# Patient Record
Sex: Female | Born: 1940
Health system: Southern US, Community
[De-identification: ages and names within clinical notes are randomized; demographics above are authoritative.]

## PROBLEM LIST (undated history)

## (undated) DIAGNOSIS — R413 Other amnesia: Secondary | ICD-10-CM

## (undated) DIAGNOSIS — E785 Hyperlipidemia, unspecified: Secondary | ICD-10-CM

## (undated) DIAGNOSIS — I739 Peripheral vascular disease, unspecified: Secondary | ICD-10-CM

## (undated) DIAGNOSIS — Z8709 Personal history of other diseases of the respiratory system: Secondary | ICD-10-CM

## (undated) DIAGNOSIS — Z22322 Carrier or suspected carrier of Methicillin resistant Staphylococcus aureus: Secondary | ICD-10-CM

## (undated) DIAGNOSIS — N200 Calculus of kidney: Secondary | ICD-10-CM

## (undated) DIAGNOSIS — I779 Disorder of arteries and arterioles, unspecified: Secondary | ICD-10-CM

## (undated) DIAGNOSIS — M1990 Unspecified osteoarthritis, unspecified site: Secondary | ICD-10-CM

## (undated) DIAGNOSIS — T4145XA Adverse effect of unspecified anesthetic, initial encounter: Secondary | ICD-10-CM

## (undated) DIAGNOSIS — T8859XA Other complications of anesthesia, initial encounter: Secondary | ICD-10-CM

## (undated) DIAGNOSIS — I10 Essential (primary) hypertension: Secondary | ICD-10-CM

## (undated) DIAGNOSIS — I2510 Atherosclerotic heart disease of native coronary artery without angina pectoris: Secondary | ICD-10-CM

## (undated) DIAGNOSIS — Z973 Presence of spectacles and contact lenses: Secondary | ICD-10-CM

## (undated) DIAGNOSIS — R011 Cardiac murmur, unspecified: Secondary | ICD-10-CM

## (undated) DIAGNOSIS — A419 Sepsis, unspecified organism: Secondary | ICD-10-CM

## (undated) DIAGNOSIS — I420 Dilated cardiomyopathy: Secondary | ICD-10-CM

## (undated) DIAGNOSIS — I119 Hypertensive heart disease without heart failure: Secondary | ICD-10-CM

## (undated) HISTORY — DX: Atherosclerotic heart disease of native coronary artery without angina pectoris: I25.10

## (undated) HISTORY — DX: Hypertensive heart disease without heart failure: I11.9

## (undated) HISTORY — DX: Calculus of kidney: N20.0

## (undated) HISTORY — PX: ABDOMINAL HYSTERECTOMY: SHX81

## (undated) HISTORY — PX: ESOPHAGOGASTRODUODENOSCOPY: SHX1529

## (undated) HISTORY — DX: Sepsis, unspecified organism: A41.9

## (undated) HISTORY — DX: Dilated cardiomyopathy: I42.0

## (undated) HISTORY — DX: Peripheral vascular disease, unspecified: I73.9

## (undated) HISTORY — DX: Unspecified osteoarthritis, unspecified site: M19.90

## (undated) HISTORY — PX: KNEE SURGERY: SHX244

## (undated) HISTORY — DX: Disorder of arteries and arterioles, unspecified: I77.9

## (undated) HISTORY — PX: COLONOSCOPY: SHX174

## (undated) HISTORY — PX: EYE SURGERY: SHX253

## (undated) HISTORY — DX: Hyperlipidemia, unspecified: E78.5

## (undated) NOTE — *Deleted (*Deleted)
It was great to see you again today!  

---

## 2000-05-20 ENCOUNTER — Other Ambulatory Visit: Admission: RE | Admit: 2000-05-20 | Discharge: 2000-05-20 | Payer: Self-pay | Admitting: Obstetrics and Gynecology

## 2001-10-10 ENCOUNTER — Encounter: Payer: Self-pay | Admitting: Family Medicine

## 2001-10-10 ENCOUNTER — Encounter: Admission: RE | Admit: 2001-10-10 | Discharge: 2001-10-10 | Payer: Self-pay | Admitting: Family Medicine

## 2002-08-03 ENCOUNTER — Encounter: Payer: Self-pay | Admitting: Orthopedic Surgery

## 2002-08-11 ENCOUNTER — Ambulatory Visit (HOSPITAL_COMMUNITY): Admission: RE | Admit: 2002-08-11 | Discharge: 2002-08-11 | Payer: Self-pay | Admitting: Orthopedic Surgery

## 2003-12-28 ENCOUNTER — Other Ambulatory Visit: Admission: RE | Admit: 2003-12-28 | Discharge: 2003-12-28 | Payer: Self-pay | Admitting: Gynecology

## 2004-04-04 ENCOUNTER — Inpatient Hospital Stay (HOSPITAL_COMMUNITY): Admission: RE | Admit: 2004-04-04 | Discharge: 2004-04-05 | Payer: Self-pay | Admitting: Gynecology

## 2005-06-07 ENCOUNTER — Ambulatory Visit: Payer: Self-pay | Admitting: Cardiology

## 2005-06-25 ENCOUNTER — Ambulatory Visit: Payer: Self-pay

## 2005-06-25 ENCOUNTER — Encounter: Payer: Self-pay | Admitting: Cardiology

## 2005-07-30 HISTORY — PX: CORONARY ANGIOPLASTY: SHX604

## 2005-08-06 ENCOUNTER — Ambulatory Visit: Payer: Self-pay | Admitting: Cardiology

## 2005-09-07 ENCOUNTER — Ambulatory Visit: Payer: Self-pay | Admitting: Cardiology

## 2005-10-19 ENCOUNTER — Ambulatory Visit: Payer: Self-pay | Admitting: Cardiology

## 2006-03-25 ENCOUNTER — Ambulatory Visit: Payer: Self-pay | Admitting: Cardiology

## 2006-03-26 ENCOUNTER — Ambulatory Visit: Payer: Self-pay

## 2006-08-28 ENCOUNTER — Ambulatory Visit: Payer: Self-pay | Admitting: Cardiology

## 2006-09-18 ENCOUNTER — Ambulatory Visit: Payer: Self-pay

## 2006-10-11 ENCOUNTER — Ambulatory Visit: Payer: Self-pay | Admitting: Cardiology

## 2006-10-11 LAB — CONVERTED CEMR LAB
ALT: 20 units/L (ref 0–40)
Bilirubin, Direct: 0.1 mg/dL (ref 0.0–0.3)
CO2: 32 meq/L (ref 19–32)
Calcium: 9.6 mg/dL (ref 8.4–10.5)
Cholesterol: 112 mg/dL (ref 0–200)
GFR calc Af Amer: 72 mL/min
GFR calc non Af Amer: 59 mL/min
Glucose, Bld: 103 mg/dL — ABNORMAL HIGH (ref 70–99)
LDL Cholesterol: 54 mg/dL (ref 0–99)
Potassium: 3.8 meq/L (ref 3.5–5.1)
Sodium: 145 meq/L (ref 135–145)
Total CHOL/HDL Ratio: 2.9
Total Protein: 6.5 g/dL (ref 6.0–8.3)
Triglycerides: 93 mg/dL (ref 0–149)

## 2006-11-14 ENCOUNTER — Ambulatory Visit: Payer: Self-pay | Admitting: Gastroenterology

## 2006-11-22 ENCOUNTER — Ambulatory Visit: Payer: Self-pay | Admitting: Gastroenterology

## 2006-12-27 ENCOUNTER — Ambulatory Visit (HOSPITAL_COMMUNITY): Admission: RE | Admit: 2006-12-27 | Discharge: 2006-12-28 | Payer: Self-pay | Admitting: Obstetrics and Gynecology

## 2006-12-27 ENCOUNTER — Encounter (INDEPENDENT_AMBULATORY_CARE_PROVIDER_SITE_OTHER): Payer: Self-pay | Admitting: Obstetrics and Gynecology

## 2007-02-12 ENCOUNTER — Inpatient Hospital Stay (HOSPITAL_COMMUNITY): Admission: EM | Admit: 2007-02-12 | Discharge: 2007-02-14 | Payer: Self-pay | Admitting: Emergency Medicine

## 2007-02-18 ENCOUNTER — Ambulatory Visit: Payer: Self-pay | Admitting: Gastroenterology

## 2007-02-24 ENCOUNTER — Ambulatory Visit: Payer: Self-pay | Admitting: Family Medicine

## 2007-03-07 ENCOUNTER — Ambulatory Visit: Payer: Self-pay | Admitting: Cardiology

## 2007-03-07 ENCOUNTER — Inpatient Hospital Stay (HOSPITAL_COMMUNITY): Admission: AD | Admit: 2007-03-07 | Discharge: 2007-03-13 | Payer: Self-pay | Admitting: Cardiology

## 2007-03-11 ENCOUNTER — Encounter: Payer: Self-pay | Admitting: Cardiology

## 2007-03-14 ENCOUNTER — Emergency Department (HOSPITAL_COMMUNITY): Admission: EM | Admit: 2007-03-14 | Discharge: 2007-03-14 | Payer: Self-pay | Admitting: Emergency Medicine

## 2007-03-24 ENCOUNTER — Ambulatory Visit: Payer: Self-pay | Admitting: Cardiology

## 2007-03-24 ENCOUNTER — Ambulatory Visit: Payer: Self-pay | Admitting: Cardiovascular Disease

## 2007-03-24 LAB — CONVERTED CEMR LAB
Alkaline Phosphatase: 150 units/L — ABNORMAL HIGH (ref 39–117)
BUN: 17 mg/dL (ref 6–23)
Basophils Relative: 0.2 % (ref 0.0–1.0)
Bilirubin, Direct: 0.3 mg/dL (ref 0.0–0.3)
CO2: 23 meq/L (ref 19–32)
Creatinine, Ser: 1.2 mg/dL (ref 0.4–1.2)
Eosinophils Relative: 2.3 % (ref 0.0–5.0)
GFR calc Af Amer: 58 mL/min
Glucose, Bld: 106 mg/dL — ABNORMAL HIGH (ref 70–99)
HCT: 34.4 % — ABNORMAL LOW (ref 36.0–46.0)
Hemoglobin: 12.2 g/dL (ref 12.0–15.0)
Lymphocytes Relative: 12.5 % (ref 12.0–46.0)
Monocytes Absolute: 0.9 10*3/uL — ABNORMAL HIGH (ref 0.2–0.7)
Monocytes Relative: 8.6 % (ref 3.0–11.0)
Neutro Abs: 7.6 10*3/uL (ref 1.4–7.7)
Neutrophils Relative %: 76.4 % (ref 43.0–77.0)
Potassium: 4.2 meq/L (ref 3.5–5.1)
RDW: 13.4 % (ref 11.5–14.6)
Total Bilirubin: 1 mg/dL (ref 0.3–1.2)
Total Protein: 7.2 g/dL (ref 6.0–8.3)
WBC: 9.9 10*3/uL (ref 4.5–10.5)

## 2007-04-09 ENCOUNTER — Ambulatory Visit: Payer: Self-pay | Admitting: Cardiology

## 2007-04-15 ENCOUNTER — Ambulatory Visit: Payer: Self-pay | Admitting: Cardiovascular Disease

## 2007-04-15 ENCOUNTER — Ambulatory Visit: Payer: Self-pay

## 2007-04-15 ENCOUNTER — Ambulatory Visit: Payer: Self-pay | Admitting: Internal Medicine

## 2007-04-15 LAB — CONVERTED CEMR LAB
ALT: 25 units/L (ref 0–35)
AST: 27 units/L (ref 0–37)
Albumin: 3.7 g/dL (ref 3.5–5.2)
Alkaline Phosphatase: 131 units/L — ABNORMAL HIGH (ref 39–117)
BUN: 14 mg/dL (ref 6–23)
Basophils Absolute: 0.1 10*3/uL (ref 0.0–0.1)
Basophils Relative: 1.3 % — ABNORMAL HIGH (ref 0.0–1.0)
CO2: 29 meq/L (ref 19–32)
Chloride: 103 meq/L (ref 96–112)
Creatinine, Ser: 1 mg/dL (ref 0.4–1.2)
HCT: 37.8 % (ref 36.0–46.0)
MCHC: 35.7 g/dL (ref 30.0–36.0)
Monocytes Relative: 5.3 % (ref 3.0–11.0)
RBC: 4.04 M/uL (ref 3.87–5.11)
RDW: 12.7 % (ref 11.5–14.6)
Total Bilirubin: 0.6 mg/dL (ref 0.3–1.2)

## 2007-04-24 ENCOUNTER — Encounter (HOSPITAL_COMMUNITY): Admission: RE | Admit: 2007-04-24 | Discharge: 2007-07-23 | Payer: Self-pay | Admitting: Cardiology

## 2007-06-04 ENCOUNTER — Ambulatory Visit: Payer: Self-pay | Admitting: Cardiology

## 2007-06-04 LAB — CONVERTED CEMR LAB
ALT: 18 units/L (ref 0–35)
Albumin: 3.6 g/dL (ref 3.5–5.2)
Alkaline Phosphatase: 112 units/L (ref 39–117)
Bilirubin, Direct: 0.1 mg/dL (ref 0.0–0.3)
LDL Cholesterol: 101 mg/dL — ABNORMAL HIGH (ref 0–99)
VLDL: 29 mg/dL (ref 0–40)

## 2007-07-24 ENCOUNTER — Encounter (HOSPITAL_COMMUNITY): Admission: RE | Admit: 2007-07-24 | Discharge: 2007-07-30 | Payer: Self-pay | Admitting: Cardiology

## 2007-07-31 ENCOUNTER — Encounter (HOSPITAL_COMMUNITY): Admission: RE | Admit: 2007-07-31 | Discharge: 2007-08-15 | Payer: Self-pay | Admitting: Cardiology

## 2007-08-08 ENCOUNTER — Ambulatory Visit: Payer: Self-pay | Admitting: Cardiology

## 2007-08-08 LAB — CONVERTED CEMR LAB
Albumin: 3.2 g/dL — ABNORMAL LOW (ref 3.5–5.2)
LDL Cholesterol: 73 mg/dL (ref 0–99)
Total Bilirubin: 0.7 mg/dL (ref 0.3–1.2)
Total CHOL/HDL Ratio: 4.2
Triglycerides: 138 mg/dL (ref 0–149)
VLDL: 28 mg/dL (ref 0–40)

## 2007-10-02 ENCOUNTER — Ambulatory Visit: Payer: Self-pay | Admitting: Cardiology

## 2007-10-10 ENCOUNTER — Ambulatory Visit: Payer: Self-pay

## 2007-10-10 LAB — CONVERTED CEMR LAB
AST: 26 units/L (ref 0–37)
Bilirubin, Direct: 0.1 mg/dL (ref 0.0–0.3)
Chloride: 107 meq/L (ref 96–112)
Cholesterol: 143 mg/dL (ref 0–200)
GFR calc non Af Amer: 53 mL/min
Glucose, Bld: 114 mg/dL — ABNORMAL HIGH (ref 70–99)
HDL: 38.5 mg/dL — ABNORMAL LOW (ref >39.0)
LDL Cholesterol: 80 mg/dL (ref 0–99)
Sodium: 139 meq/L (ref 135–145)
Total Bilirubin: 0.4 mg/dL (ref 0.3–1.2)

## 2007-12-17 ENCOUNTER — Ambulatory Visit: Payer: Self-pay | Admitting: Cardiology

## 2007-12-17 LAB — CONVERTED CEMR LAB
AST: 20 units/L (ref 0–37)
Albumin: 3.8 g/dL (ref 3.5–5.2)
Cholesterol: 148 mg/dL (ref 0–200)
Total Bilirubin: 0.7 mg/dL (ref 0.3–1.2)
Total CHOL/HDL Ratio: 4.9
VLDL: 30 mg/dL (ref 0–40)

## 2008-03-31 ENCOUNTER — Ambulatory Visit: Payer: Self-pay | Admitting: Cardiology

## 2008-05-11 ENCOUNTER — Ambulatory Visit: Payer: Self-pay | Admitting: Cardiology

## 2008-05-11 ENCOUNTER — Ambulatory Visit: Payer: Self-pay

## 2008-05-11 LAB — CONVERTED CEMR LAB
Albumin: 3.4 g/dL — ABNORMAL LOW (ref 3.5–5.2)
Alkaline Phosphatase: 102 units/L (ref 39–117)
BUN: 15 mg/dL (ref 6–23)
Calcium: 9.5 mg/dL (ref 8.4–10.5)
Cholesterol: 154 mg/dL (ref 0–200)
Creatinine, Ser: 1 mg/dL (ref 0.4–1.2)
GFR calc Af Amer: 71 mL/min
GFR calc non Af Amer: 59 mL/min
Glucose, Bld: 118 mg/dL — ABNORMAL HIGH (ref 70–99)
HDL: 32 mg/dL — ABNORMAL LOW (ref >39.0)
LDL Cholesterol: 92 mg/dL (ref 0–99)
Potassium: 4.3 meq/L (ref 3.5–5.1)
Total Protein: 6.5 g/dL (ref 6.0–8.3)
Triglycerides: 151 mg/dL — ABNORMAL HIGH (ref 0–149)
VLDL: 30 mg/dL (ref 0–40)

## 2008-10-08 ENCOUNTER — Ambulatory Visit: Payer: Self-pay | Admitting: Cardiology

## 2008-10-08 LAB — CONVERTED CEMR LAB
ALT: 19 units/L (ref 0–35)
AST: 22 units/L (ref 0–37)
Cholesterol: 158 mg/dL (ref 0–200)
HDL: 34.7 mg/dL — ABNORMAL LOW (ref >39.0)
Total Bilirubin: 0.7 mg/dL (ref 0.3–1.2)
Total Protein: 6.9 g/dL (ref 6.0–8.3)
VLDL: 25 mg/dL (ref 0–40)

## 2008-12-06 ENCOUNTER — Telehealth: Payer: Self-pay | Admitting: Cardiology

## 2009-01-14 DIAGNOSIS — F17200 Nicotine dependence, unspecified, uncomplicated: Secondary | ICD-10-CM

## 2009-01-14 DIAGNOSIS — J449 Chronic obstructive pulmonary disease, unspecified: Secondary | ICD-10-CM

## 2009-01-14 DIAGNOSIS — I679 Cerebrovascular disease, unspecified: Secondary | ICD-10-CM

## 2009-01-14 DIAGNOSIS — J4489 Other specified chronic obstructive pulmonary disease: Secondary | ICD-10-CM | POA: Insufficient documentation

## 2009-01-14 DIAGNOSIS — I2510 Atherosclerotic heart disease of native coronary artery without angina pectoris: Secondary | ICD-10-CM

## 2009-01-14 DIAGNOSIS — I739 Peripheral vascular disease, unspecified: Secondary | ICD-10-CM

## 2009-01-14 DIAGNOSIS — R079 Chest pain, unspecified: Secondary | ICD-10-CM

## 2009-01-14 DIAGNOSIS — E785 Hyperlipidemia, unspecified: Secondary | ICD-10-CM

## 2009-01-14 DIAGNOSIS — I10 Essential (primary) hypertension: Secondary | ICD-10-CM | POA: Insufficient documentation

## 2009-01-19 ENCOUNTER — Ambulatory Visit: Payer: Self-pay | Admitting: Cardiology

## 2009-01-19 ENCOUNTER — Encounter: Payer: Self-pay | Admitting: Cardiology

## 2009-01-19 DIAGNOSIS — I723 Aneurysm of iliac artery: Secondary | ICD-10-CM | POA: Insufficient documentation

## 2009-01-19 DIAGNOSIS — I714 Abdominal aortic aneurysm, without rupture, unspecified: Secondary | ICD-10-CM | POA: Insufficient documentation

## 2009-02-10 ENCOUNTER — Encounter: Payer: Self-pay | Admitting: Cardiology

## 2009-02-10 ENCOUNTER — Ambulatory Visit: Payer: Self-pay

## 2009-02-28 ENCOUNTER — Ambulatory Visit: Payer: Self-pay | Admitting: Cardiology

## 2009-03-01 ENCOUNTER — Encounter (INDEPENDENT_AMBULATORY_CARE_PROVIDER_SITE_OTHER): Payer: Self-pay | Admitting: *Deleted

## 2009-03-01 LAB — CONVERTED CEMR LAB
AST: 31 units/L (ref 0–37)
Alkaline Phosphatase: 100 units/L (ref 39–117)
BUN: 23 mg/dL (ref 6–23)
Bilirubin, Direct: 0 mg/dL (ref 0.0–0.3)
CO2: 27 meq/L (ref 19–32)
Calcium: 8.9 mg/dL (ref 8.4–10.5)
Chloride: 106 meq/L (ref 96–112)
Cholesterol: 130 mg/dL (ref 0–200)
Creatinine, Ser: 0.9 mg/dL (ref 0.4–1.2)
LDL Cholesterol: 58 mg/dL (ref 0–99)
Total CHOL/HDL Ratio: 3
Total Protein: 6.7 g/dL (ref 6.0–8.3)

## 2009-03-29 ENCOUNTER — Encounter: Payer: Self-pay | Admitting: Cardiology

## 2009-05-31 ENCOUNTER — Encounter: Payer: Self-pay | Admitting: Cardiology

## 2009-05-31 ENCOUNTER — Ambulatory Visit: Payer: Self-pay

## 2009-06-03 ENCOUNTER — Telehealth: Payer: Self-pay | Admitting: Cardiology

## 2009-08-29 ENCOUNTER — Encounter: Payer: Self-pay | Admitting: Cardiology

## 2009-10-05 ENCOUNTER — Telehealth: Payer: Self-pay | Admitting: Cardiology

## 2010-03-19 ENCOUNTER — Encounter: Payer: Self-pay | Admitting: Cardiology

## 2010-08-19 ENCOUNTER — Encounter: Payer: Self-pay | Admitting: Cardiology

## 2010-08-29 ENCOUNTER — Encounter: Payer: Self-pay | Admitting: Cardiology

## 2010-08-30 ENCOUNTER — Encounter: Payer: Self-pay | Admitting: Cardiology

## 2010-08-30 ENCOUNTER — Encounter (INDEPENDENT_AMBULATORY_CARE_PROVIDER_SITE_OTHER): Payer: Medicare Other

## 2010-08-30 ENCOUNTER — Ambulatory Visit: Admit: 2010-08-30 | Payer: Self-pay

## 2010-08-30 DIAGNOSIS — I6529 Occlusion and stenosis of unspecified carotid artery: Secondary | ICD-10-CM

## 2010-08-31 NOTE — Progress Notes (Signed)
Summary: PT ERQUEST CALL  Phone Note Call from Patient Call back at Home Phone [phone]   Caller: Patient Summary of Call: PT REQUEST CALL WOULD NOT GIVE ANY INFORMATION Initial call taken by: Judie Grieve,  October 05, 2009 11:43 AM  Follow-up for Phone Call        spoke with pt, she has an aching in her right leg, in her thigh and below her knee. she has no edema, redness or streaking of that leg. pt advised to call primary care md for eval. Deliah Goody, RN  October 05, 2009 12:30 PM

## 2010-08-31 NOTE — Letter (Signed)
Summary: [facility] - Flu Immunization  [facility]   Imported By: Marylou Mccoy 05/08/2010 12:43:56  _____________________________________________________________________  External Attachment:    Type:   Image     Comment:   External Document

## 2010-09-06 NOTE — Miscellaneous (Signed)
Summary: Orders Update  Clinical Lists Changes  Orders: Added new Test order of Carotid Duplex (Carotid Duplex) - Signed 

## 2010-11-01 ENCOUNTER — Other Ambulatory Visit: Payer: Self-pay | Admitting: Cardiology

## 2010-12-12 NOTE — Assessment & Plan Note (Signed)
[facility]                            CARDIOLOGY OFFICE NOTE   [name], [name]                      MRN:          [id number]  DATE:03/07/2007                            DOB:          August 04, 1940    [name] is a very pleasant female whom I have seen in the past for  hypertension, peripheral vascular disease, small abdominal aortic  aneurysm, cerebrovascular disease, and hyperlipidemia.  She did have a  Myoview performed on September 18, 2006.  At that time, she had an  ejection fraction of 74% and there was no ischemia or infarction.  Since  I last saw her, she did have a hysterectomy.  Approximately 5 weeks  later, she had a GI bleed that apparently was related to Schatzki's  ring.  Note, there was a question of whether or not she may have had a  Mallory-Weiss tear.  Since I last saw her, she denies any dyspnea on  exertion, orthopnea, PND, pedal edema, palpitations, pre-syncope,  syncope, or chest pain.  She does have some fatigue.   MEDICATIONS AT PRESENT:  1. Verapamil SA 240 mg p.o. b.i.d.  2. Hydrochlorothiazide 12.5 mg p.o. daily.  3. Lisinopril 40 mg p.o. daily.  4. Aspirin 8 mg p.o. daily.  5. Clonidine 0.2 mg tablets 2 p.o. b.i.d.  6. Zocor 40 mg p.o. daily.   PHYSICAL EXAM:  Blood pressure is 105/66 and her pulse is 66.  She  weighs 169 pounds.  HEENT:  Normal.  NECK:  Supple.  CHEST:  Clear.  CARDIOVASCULAR:  Regular rate and rhythm.  ABDOMEN:  Benign.  EXTREMITIES:  No edema.   ELECTROCARDIOGRAM:  Sinus rhythm with a first degree AV block.  There is  anterior and inferior T wave inversion, which is new from previous.   DIAGNOSES:  1. Abnormal electrocardiogram - note the patient's electrocardiogram      changes are new.  However, she has no symptoms at present.  We will      plan to repeat her stress Myoview for risk stratification.  She      will continue on her aspirin, Statin, and other medications for      blood  pressure control.  2. Hypertension - her blood pressure is well controlled on present      medications.  3. Peripheral vascular disease.  4. History of small abdominal aortic aneurysm - her most recent      abdominal ultrasound showed a normal caliber abdominal aorta.  She      did have moderate stenosis in the celiac access, superior      mesenteric artery, and left greater than right common iliac      arteries.  5. Cerebrovascular disease - she needs followup carotid Dopplers and      we will arrange those.  6. Chronic obstructive pulmonary disease.  7. Tobacco abuse - she was counseled to discontinue this.  8. Hyperlipidemia - she will continue on her Statin.   I will see her back in 6 months or sooner if necessary.     Courtney Frieze Jens Som, MD,  [facility]  Electronically Signed    BSC/MedQ  DD: 03/07/2007  DT: 03/08/2007  Job #: [id number]

## 2010-12-12 NOTE — Assessment & Plan Note (Signed)
[facility]                            CARDIOLOGY OFFICE NOTE   [name], [name]                      MRN:          [id number]  DATE:04/09/2007                            DOB:          27-Sep-1940    [name] returns for followup today. She had recent percutaneous  coronary intervention of her circumflex and right coronary artery in  early August. When I saw her on March 24, 2007 she was complaining of  tightness in her chest but it had been persistent for 2 weeks. We felt  that it was not cardiac. She was also having nausea. We discontinued her  Chantix and her symptoms improved. Since that time she feels much  better. She denies any dyspnea, chest pain, palpitations, or syncope.  There is no pedal edema. Her nausea has resolved. Also note we  discontinued her Zocor as her follow up liver functions were abnormal.  Her medications include:  1. Hydrochlorothiazide 12.5 mg p.o. daily.  2. Lisinopril 40 mg daily.  3. Aspirin 81 mg p.o. daily.  4. Clonidine 0.4 mg p.o. b.i.d.  5. Norvasc 10 mg p.o. daily.  6. Plavix 75 mg p.o. daily.  7. Metoprolol 25 mg p.o. b.i.d.   PHYSICAL EXAMINATION:  Today shows a blood pressure of 116/72 and the  pulse is 66. She weighs 165 pounds.  HEENT: Normal.  NECK: Supple.  CHEST: Clear.  CARDIOVASCULAR  EXAM: Reveals a regular rate and rhythm.  ABDOMINAL EXAM: Soft and not tender. Her right groin shows no hematoma  and no bruits.  EXTREMITIES: Show trace edema.   DIAGNOSES:  1. Coronary artery disease- [name] has had no further chest pain      and we will continue with her medical therapy including her      aspirin, Plavix, beta blocker, and ACE inhibitor. Her statin has      been discontinued for now secondary to increased liver functions.  2. Chest pain- resolved.  3. Hypertension- her blood pressure is well controlled on her present      medications.  4. Hyperlipidemia- we plan to recheck liver  functions in approximately      4 weeks. If her liver functions have returned to normal then we      will try a different statin (most likely Pravachol). We would then      check lipids and liver 6 weeks following and reinitiation.  5. Tobacco abuse- she is trying to discontinue this.  6. Peripheral vascular disease.  7. History of gastrointestinal bleed secondary to Mallory-Weiss tear.  8. Cerebral vascular disease- she is scheduled for follow up carotid      Doppler this month.  9. Chronic obstructive pulmonary disease.   I will see back in 6 months.     [name] Jens Som, MD, [facility]  Electronically Signed    BSC/MedQ  DD: 04/09/2007  DT: 04/09/2007  Job #: [id number]

## 2010-12-12 NOTE — H&P (Signed)
[name], [name]               ACCOUNT NO.:  [id number]   MEDICAL RECORD NO.:  [id number]          PATIENT TYPE:  INP   LOCATION:  1232                         FACILITY:  [facility]   PHYSICIAN:  Della Goo, M.D. DATE OF BIRTH:  10-Sep-1940   DATE OF ADMISSION:  02/12/2007  DATE OF DISCHARGE:                              HISTORY & PHYSICAL   PRIMARY CARE PHYSICIAN:  Dr. Susann James.   CHIEF COMPLAINT:  Vomiting bright red blood.   HISTORY OF PRESENT ILLNESS:  This is a 70 year old female who presented  to the emergency department with complaints of vomiting bright red blood  the evening prior to admission.  She reports having several episodes  which were proceeded with nausea.  She did not have melena or  hematochezia at that time.  She denied having any chest pain or  shortness of breath.  She did report having weakness and dizziness.  Upon arrival to the emergency department she began to have increased  stools which progressed from brownish solid stool that was heme  negative, to melanotic stools which were heme positive.  The patient  reported having crampy abdominal discomfort when she began to have the  increased stools.  She denies having any fevers, chills.    Six weeks ago, the patient underwent a total abdominal hysterectomy and  bilateral salpingo-oophorectomy.  She reports taking ibuprofen and Aleve  as needed for pain up to one week ago.   PAST MEDICAL HISTORY:  Hypertension   PAST SURGICAL HISTORY:  Total abdominal hysterectomy bilateral salpingo-  oophorectomy secondary to uterine and pelvic prolapse.   MEDICATIONS:  1. Verapamil 240 mg one p.o. daily.  2. Clonidine 0.2 mg one p.o. b.i.d.  3. Lisinopril 10 mg one p.o. daily.  4. Hydrochlorothiazide 12.5 mg one p.o. daily.  5. Over-the-counter Aleve p.r.n.   ALLERGIES:  NO KNOWN DRUG ALLERGIES.   SOCIAL HISTORY:  The patient is married, smoker, one pack per day for 40  years.  Rare alcohol usage.  No illicit  drug usage.   FAMILY HISTORY:  Positive strong family history of cancer.  Mother had  breast cancer.  Father died of leukemia.  One brother had bone cancer.  The patient also had a brother who died of Doreatha Martin disease and a  sister who has hypertension.  No diabetes or coronary artery disease in  her family that she knows of.   PHYSICAL EXAMINATION:  GENERAL:  Nervous 70 year old well-nourished,  well-developed female in discomfort, but no acute distress currently.  VITAL SIGNS:  Initially, temperature 97.0, blood pressure 141/73, heart  rate 99-111 and respirations 20 with O2 saturations 95%.  HEENT Normocephalic, atraumatic.  Pupils equally round reactive to  light.  Extraocular muscles are intact.  Funduscopic benign.  Oropharynx  is clear.  NECK:  Supple, full range of motion.  No thyromegaly, adenopathy,  jugular venous distension.  CARDIOVASCULAR:  Mild tachycardiac rate and rhythm.  No murmurs, gallops  or rubs.  LUNGS:  Clear to auscultation bilaterally.  ABDOMEN:  Positive bowel sounds, soft, mildly tender in the lower  quadrants.  No rebound or  guarding.  No hepatosplenomegaly.  EXTREMITIES: Without cyanosis, clubbing or edema.  NEUROLOGIC:  Alert and oriented x3.  There are no focal deficits.   LABORATORY STUDIES:  White blood cell count 19.7, hemoglobin 17.6,  hematocrit 49.6, MCV 93.3, platelets 265, neutrophils 85%, lymphocytes  12%, MCV 93.3.  Protime 12.6, INR 0.9, PTT 26.  Hemoccult positive  melanotic stools.  Chemistry:  Sodium is 137, potassium 3.0, chloride  102, carbon dioxide 24, BUN 16, creatinine 1.02, glucose 159, calcium  10.1, albumin 3.6.  Liver function tests:  AST 38, ALT 28, total  bilirubin 0.8.  Acute abdominal series pending.   ASSESSMENT:  A 70 year old female presenting with hematemesis being  admitted with  1. Acute GI bleeding/hematemesis.  2. Orthostatic hypotension.  3. Polycytosis and leukocytosis, possible myelodysplastic syndrome.   4. Hypokalemia.  5. History of hypertension.  6..  Tobacco abuse.   PLAN:  The patient will be admitted to an area for cardiac monitoring.  She has been placed on a clear liquid diet and IV fluids.  Her  electrolytes will be replaced p.r.n.  IV Protonix has been ordered.  A  unit of packed red blood cells has been placed on hold for now.  Serial  H&H will be performed q.12 h.  At this particular point, the patient  will be further evaluated and consultations will be placed for  gastroenterology and possibly hematology/oncology.  The patient has been  placed on her regular medications.  DVT prophylaxis of TED hose has been  ordered.      Della Goo, M.D.  Electronically Signed     HJ/MEDQ  D:  02/12/2007  T:  02/12/2007  Job:  [id number]

## 2010-12-12 NOTE — Discharge Summary (Signed)
NAME:  [name], [name]               ACCOUNT NO.:  [id number]   MEDICAL RECORD NO.:  [id number]          PATIENT TYPE:  INP   LOCATION:  6523                         FACILITY:  [facility]   PHYSICIAN:  Everardo Beals. Juanda Chance, MD, FACCDATE OF BIRTH:  06/24/1941   DATE OF ADMISSION:  03/07/2007  DATE OF DISCHARGE:  03/13/2007                               DISCHARGE SUMMARY   PRIMARY CARDIOLOGIST:  Madolyn Frieze. Jens Som, MD, [facility]   PRIMARY CARE PHYSICIAN:  Sharlot Gowda, M.D.   PROCEDURES PERFORMED DURING HOSPITALIZATION:  1. Cardiac catheterization dated March 10, 2007, and performed by Dr.      Charlies Constable.      a.     Coronary artery disease with 40% narrowing of the proximal       LAD, 80% in the proximal circumflex artery, 80% proximal to mid       stenosis in the right coronary artery with normal LV function.      b.     Right iliac aneurysm.      c.     Successful percutaneous coronary intervention of tandem       lesions in the proximal and proximal-to-mid right coronary artery       using 3 overlapping Palmaz stents (the second stent for guiding       catheter dissection and the third stent for probable edge       dissection) with improvement of stented area narrowing from 80% to       0%.      d.     Status post percutaneous coronary intervention to the       circumflex artery using a drug-eluting stent, a Palmaz, decreasing       90% stenosis to 0% stenosis per Dr. Charlies Constable.  This was       completed on March 12, 2007.   PRIMARY DISCHARGE DIAGNOSES:  1. Coronary artery disease.      a.     Status post stenting to the right coronary artery and       circumflex artery as described above (please see Dr. Regino Schultze       thorough cardiac catheterization note for details).  2. 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  3. Hypertension.  4. History of mild-to-moderate cerebrovascular disease.   SECONDARY DIAGNOSES:  1. History of recent GI bleed  secondary to Mallory-Weiss tear.  2. History of diverticula.  3. History of peripheral vascular disease and small abdominal aortic      aneurysm.  4. History of chronic obstructive pulmonary disease.  5. Ongoing tobacco abuse.  6. Hyperlipidemia.   HOSPITAL COURSE:  This is a 70 year old Caucasian female with the above-  mentioned diagnoses who presented to the emergency room complaining of  intermittent indigestion and burning in epigastric and substernal areas  on March 07, 2007.  These were recurring at rest.  She did not have any  associated nausea, vomiting, diaphoresis, or shortness of breath.  The  discomfort lasted approximately 15-30 minutes.  The patient had  underwent an EGD  on February 13, 2007, that revealed a small Mallory-Weiss  tear, but she denied any recent melena or hematochezia.  When seen in  the emergency room, the patient's electrocardiogram was markedly  abnormal compared to previous and was admitted for unstable angina.  The  patient was seen examined by Dr. Olga Millers in the emergency room.  The patient was started on Nexium.  The patient was also discussed.  A  cardiac catheterization by Dr. Jens Som with risks and benefits  evaluated and discussed, and the patient was willing to proceed.  The  patient was intermittently placed on heparin until catheterization was  completed.   The patient was seen and examined the following day by Dr. Jens Som.  The patient's cardiac enzymes were found to be negative.  She was  continued on heparin, statin, Lopressor, and ACE inhibitor, and  monitored over the weekend.  The patient subsequently had cardiac  catheterization completed as described above by Dr. Charlies Constable on  Monday, August 11.  The patient had two-vessel coronary artery disease  of the circumflex and right coronary artery.   Post initial intervention to the right coronary artery, the patient was  found to be hypertensive, and sheaths were delayed being  pulled  secondary to this.  The patient was given hydralazine 20 mg IV x1,  placed on nitroglycerin, and reinstitution of her p.o. antihypertensive  was given.  The patient's blood pressure did resume to normal without  any further incident.   The following day, the patient was found to have a hematoma of the right  groin.  Ultrasound did reveal a pseudoaneurysm.  The patient had  compression of the pseudoaneurysm that morning after given morphine and  nitroglycerin for blood pressure control and pain control.  The  compression was held for approximately 8 minutes, and the patient  remained stable.  Followup check that afternoon revealed that the  pseudoaneurysm remained thrombosed, and she had no further symptoms.  There was a great deal of ecchymosis noted around the site, however.   On March 12, 2007, the patient was seen again by Dr. Jens Som and Dr.  Juanda Chance and found to be stable to proceed with percutaneous coronary  intervention of the circumflex artery via the left femoral artery.  Circumflex percutaneous coronary intervention was completed with a drug-  eluting stent per Dr. Juanda Chance and was successful.  There was no post  procedure complications or pseudoaneurysm.   On March 13, 2007, the patient was feeling great and was anxious to go  home.  The patient's blood pressure was well controlled during the  hospitalization.  The patient's verapamil was discontinued, and she was  placed on Norvasc.  The patient's clonidine was also titrated down from  0.4 mg b.i.d. to 0.2 mg b.i.d.  Followup labs did reveal mild anemia at  9.4.  The patient's telemetry revealed normal sinus rhythm in the 80s,  and EKG revealed normal without any evidence of ischemia.  The patient  was seen and examined by myself and Dr. Charlies Constable on the day of  discharge and found to be stable.  She will be continued on Plavix and  aspirin along with Norvasc.  Clonidine will be decreased to 0.2 mg  b.i.d.   Lopressor was added to 25 mg b.i.d., and Plavix will need to be  on board for at least 1 year if not beyond.   DISCHARGE LABORATORY:  Sodium 140, potassium 3.4, chloride 107, CO2 of  27, BUN 16, creatinine 1.1,  glucose 124.  Hemoglobin 9.4, hematocrit  26.9, white blood cells 8.1, platelets 165.  Telemetry revealed normal  sinus rhythm in the 80s.   VITAL SIGNS:  Blood pressure 146/49, heart rate 96, respirations 16,  temperature 97.6, O2 sat 96% on room air.   DISCHARGE MEDICATIONS:  1. Norvasc 10 mg 1 p.o. daily (new).  2. Clonidine 0.2 mg daily (new dose).  3. Hydrochlorothiazide 12.5 mg daily.  4. Lisinopril 40 mg daily.  5. Aspirin 81 mg daily.  6. Plavix 75 mg daily new prescription).  7. Zocor 40 mg at bedtime.  8. Nitroglycerin 0.4 mg p.r.n. chest pain (new prescription).   ALLERGIES:  NO KNOWN DRUG ALLERGIES.   FOLLOWUP PLANS AND APPOINTMENTS:  1. The patient will be followed up by Dr. Olga Millers on August 25      at 8 a.m.  2. The patient will follow up with the primary care physician, Dr.      Susann Givens.  The patient is to call on her own accord to make that      appointment.  3. The patient has been given post-cardiac catheterization      instructions with particular emphasis on the groin site, post      catheterization for evidence of bleeding, return of hematoma      infection, or severe pain.  4. The patient has been advised on new medications.  She is to stop      her verapamil and start the Norvasc and clonidine at the lower      dose, Plavix, and nitroglycerin as needed.   TIME SPENT WITH THE PATIENT TO INCLUDE PHYSICIAN TIME:  50 minutes.      Bettey Mare. Lyman Bishop, NP      Everardo Beals. Juanda Chance, MD, [facility]  Electronically Signed    KML/MEDQ  D:  03/13/2007  T:  03/13/2007  Job:  [id number]   cc:   Sharlot Gowda, M.D.

## 2010-12-12 NOTE — H&P (Signed)
NAME:  [name], [name]               ACCOUNT NO.:  [id number]   MEDICAL RECORD NO.:  [id number]          PATIENT TYPE:  INP   LOCATION:  4739                         FACILITY:  [facility]   PHYSICIAN:  Madolyn Frieze. Jens Som, MD, FACCDATE OF BIRTH:  28-Jul-1941   DATE OF ADMISSION:  03/07/2007  DATE OF DISCHARGE:                              HISTORY & PHYSICAL   HISTORY OF PRESENT ILLNESS:  [name] is a very pleasant 70 year old  female who has past medical history of hypertension, hyperlipidemia,  peripheral vascular disease, tobacco abuse and recent GI bleed secondary  to [name] who is being admitted for chest pain and  electrocardiographic changes.  I have followed [name] in the past  for hypertension and peripheral vascular disease.  She did have an  abdominal ultrasound September 18, 2006, that showed a normal caliber  abdominal aorta.  There was moderate stenosis in the celiac axis, SMA  and left greater than right common iliac arteries.  She also has mild  cerebrovascular disease and a 40-59% range bilaterally.  She had a  Myoview on September 18, 2006, that showed normal perfusion and normal LV  function.  She had a recent vaginal hysterectomy on Dec 27, 2006.  Since  then, she has had intermittent indigestion.  She describes this as a  burning in the epigastric and substernal area.  There is no radiation.  It occurs predominantly after eating and is relieved with Prilosec.  There is no associated nausea, vomiting, diaphoresis or shortness of  breath.  It typically lasts 15-30 minutes.  Most recent episode was  yesterday.  Note, these are recurring at rest.  Also note that she was  admitted in July secondary to a GI bleed.  She underwent an EGD on February 13, 2007, that revealed a small Mallory-Weiss James.  She denies any  recent melena or hematochezia.  Her electrocardiogram today is markedly  abnormal compared to previous, and she is being admitted for probable  unstable angina.   MEDICATIONS AT PRESENT:  1. Verapamil SA 240 mg p.o. b.i.d.  2. Hydrochlorothiazide 12.5 mg p.o. daily.  3. Lisinopril 40 mg daily.  4. Aspirin 81 mg daily.  5. Clonidine 0.4 mg p.o. b.i.d.  6. Zocor 40 mg p.o. q.h.s.   ALLERGIES:  She has no known drug allergies.   SOCIAL HISTORY:  She does smoke but she does not consume alcohol.   FAMILY HISTORY:  Negative coronary disease.   PAST MEDICAL HISTORY:  1. Significant for hypertension, hyperlipidemia, but there is no      diabetes mellitus.  2. She has a history of mild to moderate cerebrovascular disease.  3. She also has a history of recent GI bleed secondary to Mallory-      Weiss James as described in HPI.  4. She also has a history of diverticula by previous EGD in April.  5. An she had a recent hysterectomy as well.  6. She has a history of peripheral vascular disease and small      abdominal aortic aneurysm.  7. She has had prior arthroscopic knee  surgery.  8. She has a history of COPD.  9. She has had an enterocele and rectocele repaired as well.   REVIEW OF SYSTEMS:  There is no headaches or fevers, chills.  No  productive cough or hemoptysis.  There is no dysphagia, odynophagia,  melena, hematochezia.  There is no dysuria or hematuria.  There is no  rash or seizure activity.  There is no orthopnea, PND or pedal edema.  Remaining systems are negative.   PHYSICAL EXAMINATION:  VITAL SIGNS:  Today shows a blood pressure of  105/66, pulse is 66, weighs a 69 pounds.  GENERAL:  She is well-developed, well-nourished, in no acute distress.  Skin is warm, dry.  She does not appear to be depressed.  There is no  peripheral clubbing.  BACK:  Normal.  HEENT: Normal.  Normal eyelids.  NECK:  Supple with normal carotid upstroke bilaterally.  She has soft  bilateral carotid bruits.  There is no jugular distension.  CHEST:  Clear.  Normal expansion.  Cardiovascular: Regular rate and rhythm.  Normal S1-S2.  There  is a 2/6  systolic ejection murmur at the left sternal border.  ABDOMINAL:  Nontender.  Positive bowel sounds.  No hepatosplenomegaly.  No mass appreciated.  There is no abdominal bruit.  She has 2+ femoral  pulses bilaterally.  No bruits.  EXTREMITIES:  No edema I can palpate.  No cords.  She has 1+ dorsalis  pedis pulses bilaterally.  NEUROLOGICAL:  Grossly intact.  Her electrocardiogram today shows a  sinus rhythm at a rate of 63.  There is a first-degree AV block.  The  axis is normal.  There is deep anterior and inferior T-wave changes.   DIAGNOSES:  1. Unstable angina - [name] presents as a routine follow-up      today.  However, she is describing symptoms of indigestion.  Her      electrocardiogram is markedly changed compared to previous.  I am      extremely concerned that this may indeed be acute coronary      syndrome.  Her anterior T-wave inversion is suggesting LAD lesion.      We will plan to admit given that she had chest pain yesterday and      at rest.  We will cycle enzymes to rule out myocardial infarction.      I will treat with aspirin, heparin, statin and ACE inhibitor.  We      will discontinue verapamil and then treat with Lopressor both for      her blood pressure and probable coronary disease.  Note, she has      had a recent GI bleed, but an EGD revealed that it was a problem      with [place] Artist James.  She has had no bleeding recently.  We      will follow her hemoglobin/hematocrit and I will also add Nexium.  2. Hypertension - we will follow her blood pressure and adjust her      medications as indicated.  3. Hyperlipidemia - she will continue on her statin.  4. Tobacco abuse - we discussed the importance of discontinuing this.  5. Cerebrovascular disease - she will need follow-up carotid Dopplers      as an outpatient.  6. We did discuss the options today, and I think we will proceed with      cardiac catheterization on Monday.  The risk and  benefits have been  discussed and she agrees to proceed.      Madolyn Frieze Jens Som, MD, [facility]  Electronically Signed     BSC/MEDQ  D:  03/07/2007  T:  03/09/2007  Job:  [id number]

## 2010-12-12 NOTE — Assessment & Plan Note (Signed)
[name]                            CARDIOLOGY OFFICE NOTE   [name], [name]                      MRN:          [id number]  DATE:03/24/2007                            DOB:          04/16/1941    HISTORY:  [name] is a 70 year old female who was recently admitted  to [facility] with indigestion and a markedly abnormal  electrocardiogram.  She underwent cardiac catheterization by Dr. Juanda Chance  on March 10, 2007.  She was found to have a 40% proximal left anterior  descending, an 80% proximal circumflex, and an 80% proximal to mid right  coronary artery.  Her left ventricular function was normal.  She had PCI  of the lesions in the right coronary artery with three overlapping drug-  eluting stents.  She did have a groin bleed and a pseudoaneurysm in the  right groin that was compressed.  She had a staged intervention to the  circumflex on March 12, 2007 with a drug-eluting stent.  Following  discharge she had an episode of double vision and was seen in the  emergency room on March 14, 2007.  She left against medical advice as  she apparently became upset with the care.  Since then she has not had  any blurred vision, nor has she had any weakness in her extremities.  She does have multiple complaints today.  She complains of tightness in  her chest that has been present every since her procedure.  It has been  continuous without ever completely resolving.  It can occur both with  activities and at rest.  There is no change with inspiration.  She also  complains of weakness.  She also complains of constipation as well as  some lower abdominal pain.  There is no shortness of breath.   MEDICATIONS:  1. Hydrochlorothiazide 12.5 mg p.o. daily.  2. Lisinopril 40 mg p.o. daily.  3. Aspirin 81 mg p.o. daily.  4. Clonidine 0.2 mg tablets, two p.o. b.i.d.  5. Zocor 40 mg p.o. daily.  6. Norvasc 10 mg p.o. daily.  7. Plavix 75 mg p.o.  daily.  8. Lopressor 25 mg p.o. b.i.d.   PHYSICAL EXAMINATION:  VITAL SIGNS:  Blood pressure 135/69, pulse of 71.  HEENT:  Normal.  NECK:  Supple.  CHEST: Clear.  CARDIOVASCULAR:  Exam reveals a regular rate and rhythm.  ABDOMEN:  Her abdominal exam shows no tenderness to palpation.  There is  no rebound or guarding.  I cannot palpate hematomas.  EXTREMITIES:  She has 2+ femoral pulses bilaterally.  There is mild  ecchymoses over the right groin but there is no bruit noted.  There is  no bruit on the left.  The extremities show no edema.   CLINICAL DATA:  Her electrocardiogram shows a sinus rhythm at a rate of  72.  The axis is normal.  There is anterior and inferior T wave  inversions.  It is unchanged from previous.   DIAGNOSIS:  1. Coronary artery disease status post percutaneous coronary      intervention of the circumflex and right  coronary arteries with      drug-eluting stents.  She will continue with her aspirin and Plavix      as well as her ACE inhibitor, Statin and beta blocker.  2. Chest pain.  She has had tightness every since her procedure.  Her      electrocardiogram is unchanged and the duration suggests noncardiac      etiology.  She does state there was some relief with Tylenol.  We      will follow this and work up as needed.  3. Hypertension.  Her blood pressure is well controlled on her present      medications.  4. Hyperlipidemia.  We will continue with her Statin and we will check      her lipids today and adjust as indicated with goal LDL of less than      70.  5. Tobacco abuse.  She is on Chantix now and is trying to discontinue      this.  6. History of peripheral vascular disease.  7. History of gastrointestinal bleed secondary to Mallory-Weiss tear.      We will check a CBC today as well.  8. Cerebrovascular disease.  She will need followup carotid Doppler's.  9. Chronic obstructive pulmonary disease.   PLAN:  We will see her back in two weeks or  sooner if necessary.   ADDENDUM:  We did review [name] medications. She is on Chantix to  discontinue her tobacco use which can contribute to nausea, and  constipation. She will most likely discontinue this and to continue to  try to quit smoking on her own. Hopefully this will improve her  symptoms.     Madolyn Frieze Jens Som, MD, [facility]  Electronically Signed    BSC/MedQ  DD: 03/24/2007  DT: 03/24/2007  Job #: [id number]

## 2010-12-12 NOTE — Cardiovascular Report (Signed)
NAME:  [name], [name]               ACCOUNT NO.:  [id number]   MEDICAL RECORD NO.:  [id number]          PATIENT TYPE:  INP   LOCATION:  6523                         FACILITY:  [facility]   PHYSICIAN:  Everardo Beals. Juanda Chance, MD, FACCDATE OF BIRTH:  06/06/1941   DATE OF PROCEDURE:  03/10/2007  DATE OF DISCHARGE:                            CARDIAC CATHETERIZATION   CLINICAL HISTORY:  This is a Beebe is 70 years old and has a history  of hypertension, hyperlipidemia, peripheral vascular disease and tobacco  abuse.  She was seen in the clinic by Dr. Jens Som with some symptoms of  indigestion and anterior T-wave changes on her ECG.  For this reason, he  brought in the hospital for evaluation of angiography.  Her heart  cardiac markers were negative.   PROCEDURE:  The procedure was performed via the right femoral arteries  and arterial sheath and initially 5-French pre-formed coronary  catheters.  We had difficulty navigating up the aorta and used exchange  wires after the first pass.  After completion of diagnostic study, we  made a decision to do intervention of the right coronary artery and  possibly the circumflex artery.   The patient was given antiemetics, bolus and infusion and was loaded  with 600 mg of Plavix and was given 20 mg of Pepcid.  She had received a  four chewable aspirin previously.  The right coronary artery was  tortuous and had some calcification, so we chose to use an AL-1 6-French  guiding catheter with side holes.  We passed a Pro-water wire down the  vessel with some difficulty, due to tortuosity of the vessel.  We then  pre-dilated the two lesions in the proximal and proximal-to-mid right  coronary artery with a 2.25 x 50-mm Maverick, performing two inflation  up to 8 atmospheres for 30 seconds.  We then elected to try and cover  both lesions with a 2.5 x 28-mm Promus stent.  With some difficulty, we  were able to advance the stent in position for this, with one  inflation  of 14 atmospheres for 30 seconds.  At this point, we felt that there was  guide dissection in the proximal portion of the vessel, so we elected to  cover this with a 2.5 x 12-mm  Promus stent, overlapping the first  stent.  We deployed this with one inflation of 14 atmospheres for 30  seconds.  We then post-dilated both stents with 2.75 x 15-mm Quantum  Maverick, performing three inflations up to 16 atmospheres for 20  seconds.  At this point, there was an unusual appearance just proximal  to the more proximal stent, which looked like a double lumen or linear  filling defect.  At this point, we had lost our guiding catheter and  went back in with a JR-4 diagnostic catheter.  We then changed out for a  JR-4 6-French guiding catheter with side holes and attempted to pass an  IVUS catheter down the vessel, but we had difficulty passing the  catheter, due to the lack of coaxial support with a guiding catheter.  Because the procedure  was long and difficult, we elected the hand stent  the ostial portion of the guiding catheter with a 2.75 x 8 mm Promus ,  then we deployed this with one inflation up to 14 atmospheres for 30  seconds and a second inflation up to 16 atmospheres for 30 seconds.  This stent overlapped the more proximal of the previous two overlapping  stents.  At this point, we had three overlapping stents. The final  diagnostic studies were then performed through the guiding catheter.   We had previously planned to treat the circumflex artery.  Because of  the length of the intervention of the right coronary artery, we elected  to postpone that.  The right femoral was not suitable for closure.  The  patient returned to post angio room for  further observation.   CONCLUSION:  1. Coronary artery disease with 40% narrowing in the proximal LAD, 80%      stenosis in the proximal circumflex artery, 80% proximal-to-mid      stenosis in the right coronary artery, with normal  LV function.  2. Right iliac aneurysm.  3. Successful PCI of the tandem lesions in the proximal and proximal      to mid-right coronary using three overlapping Promus stents (the      second stent for a guiding catheter dissection and a third stent      for a probable edge dissection), with improvement of stented area      of narrowing from 80% to 0%.   DISPOSITION:  The patient was returned to the post angio room for  further observation.  Will plan PCI of the circumflex artery on  Wednesday.      Bruce Elvera Lennox Juanda Chance, MD, [facility]  Electronically Signed     BRB/MEDQ  D:  03/10/2007  T:  03/11/2007  Job:  [id number]   cc:   Madolyn Frieze. Jens Som, MD, [facility]  Bruce R. Juanda Chance, MD, [facility]  Cardiopulmonary Lab  Sharlot Gowda, M.D.

## 2010-12-12 NOTE — Assessment & Plan Note (Signed)
[facility]                            CARDIOLOGY OFFICE NOTE   [name], [name]                      MRN:          [id number]  DATE:03/31/2008                            DOB:          Jul 22, 1941    Ms. [name] is a pleasant 70 year old female who has had a history of  coronary artery disease.  She is status post PCI of her circumflex and  right coronary artery with drug-eluting stents in August 2008.  When I  last saw her, she was having occasional chest pain.  A Myoview was  performed on October 10, 2007, that was normal and an ejection fraction  was 77%.  Since I last saw her, she has mild dyspnea on exertion.  There  is no orthopnea, PND, or pedal edema.  She occasionally feels tight in  her chest.  It gets worse with lying flat and with coughing.  It lasts  for several seconds and resolves spontaneously.  There is no associated  nausea, vomiting, shortness of breath, or diaphoresis.  Note, she does  not have exertional chest pain.  It resolves spontaneously without any  intervention.  It is unchanged.  She does not have exertional chest  pain.   MEDICATIONS AT PRESENT:  1. Carvedilol 6.25 mg p.o. b.i.d.  2. Amlodipine 10 mg p.o. daily.  3. Hydrochlorothiazide 12.5 mg p.o. daily.  4. Lisinopril 40 mg p.o. daily.  5. Aspirin 81 mg p.o. daily.  6. Clonidine 0.2 mg p.o. b.i.d.  7. Plavix 75 mg p.o. daily.   Physical exam shows a blood pressure of 115/70 and her pulse is 66.  She  weighs 167 pounds.  Her HEENT is normal.  Her neck is supple.  Her chest  is clear.  Cardiovascular exam reveals regular rhythm.  Abdominal exam  shows no tenderness.  Extremities show no edema.   Electrocardiogram shows sinus rhythm at a rate of 69.  The axis is  normal.  There are no significant ST changes noted.   DIAGNOSES:  1. Chest pain - Her symptoms do not sound cardiac and her Myoview back      in March was normal.  We will not pursue this further.  2.  Coronary artery disease - She will continue on her aspirin, Plavix,      beta-blocker, and ACE inhibitor.  She has not tolerated Pravachol      or Crestor.  I will try a low-dose Lipitor at 10 mg p.o. daily and      see if she tolerates.  3. Hypertension - Blood pressure is adequately controlled on her      present medications.  We will check a BMET in 6 weeks when she      returns for her lipids and liver.  4. Hyperlipidemia - We are adding low-dose Lipitor, and we will check      lipids and liver in 6 weeks and hope that she will tolerate this.  5. Tobacco abuse - We discussed the importance of discontinuing this      between 3-10 minutes.  6. Peripheral vascular disease -  She will continue on her aspirin and      we are resuming her statin.  7. Cerebrovascular disease - She will need followup carotid Dopplers      and we will arrange this.  8. History of gastrointestinal bleed secondary to Mallory-Weiss tear.  9. History of chronic obstructive pulmonary disease.   We discussed the importance of diet and exercise as well.  We will see  her back in 9 months.     Madolyn Frieze Jens Som, MD, [facility]  Electronically Signed    BSC/MedQ  DD: 03/31/2008  DT: 04/01/2008  Job #: [id number]

## 2010-12-12 NOTE — Assessment & Plan Note (Signed)
[facility]                            CARDIOLOGY OFFICE NOTE   [name], [name]                      MRN:          [id number]  DATE:10/02/2007                            DOB:          1940/09/22    HISTORY OF PRESENT ILLNESS:  [name] is a very pleasant female who  is status post PCI of her circumflex and right coronary artery with drug-  eluting stents in August of 2008.  Since I last saw her, she  occasionally feels an uncomfortable feeling in her chest.  This is not  related to exertion.  It is not pleuritic in position, nor is it related  to food.  It last for several minutes and resolves spontaneously.  The  pain does not radiate.  There is no associated nausea, vomiting,  shortness of breath or diaphoresis.  She does have some dyspnea on  exertion.  There is no orthopnea, PND or pedal edema.  She has decreased  her tobacco use to four cigarettes per week.   MEDICATIONS:  1. Pravachol 80 mg p.o. daily.  2. Carvedilol 6.25 mg p.o. b.i.d.  3. Amlodipine 10 mg daily.  4. Hydrochlorothiazide 12.5 mg daily.  5. Lisinopril 40 mg daily.  6. Aspirin 81 mg daily.  7. Clonidine 0.2 mg p.o. b.i.d.  8. Plavix 75 mg p.o. daily.   PHYSICAL EXAMINATION:  VITAL SIGNS:  Blood pressure 122/80 and pulse is  84.  She weighs 164 pounds.  HEENT:  Normal.  NECK:  Supple.  CHEST:  Clear.  CARDIOVASCULAR:  Regular rate and rhythm.  There is 2/6 systolic  ejection murmur at the left sternal border.  ABDOMEN:  Shows no tenderness.  EXTREMITIES:  Show no edema.   STUDIES:  Electrocardiogram shows a sinus rhythm rate of 91.  The axis  is normal.  There are no significant ST changes.   DIAGNOSES:  1. Atypical chest pain - [name] has had an uncomfortable feeling      in her chest at times, but did is not clearly cardiac.  We will      schedule her to have an adenosine Myoview.  If it shows no      ischemia, then we will continue medical therapy.  2. Coronary artery disease - she will continue with aspirin, Plavix,      beta blocker and ACE inhibitor.  She will also continue on her      statin.  3. Hypertension - blood pressure is out of control on her present      medications.  4. Hyperlipidemia - we will continue with Pravachol and would check      lipids and liver when she returns for her Myoview.  5. Tobacco abuse - we discussed importance of continuing to decrease      this and ultimately quit.  6. Peripheral vascular disease.  7. History of GI bleed secondary to Mallory-Weiss tear.  8. Cerebrovascular disease - she will need follow-up carotid Dopplers      in September 2009.  9. History of COPD.   DISCHARGE INSTRUCTIONS:  1. I  will see her back in 6 months.  2. She will continue with diet, exercise.     Madolyn Frieze Jens Som, MD, [facility]  Electronically Signed    BSC/MedQ  DD: 10/02/2007  DT: 10/03/2007  Job #: [id number]

## 2010-12-12 NOTE — H&P (Signed)
NAME:  [name], [name]               ACCOUNT NO.:  [id number]   MEDICAL RECORD NO.:  [id number]          PATIENT TYPE:  AMB   LOCATION:  SDC                           FACILITY:  [facility]   PHYSICIAN:  Sherron Monday, MD        DATE OF BIRTH:  06-18-1941   DATE OF ADMISSION:  DATE OF DISCHARGE:                              HISTORY & PHYSICAL   PROCEDURE PLANNED:  Total vaginal hysterectomy, bilateral salpingo-  oophorectomy, possible anterior/posterior repair as well as cystoscopy.   ADMISSION DIAGNOSIS:  Pelvic prolapse.   HISTORY OF PRESENT ILLNESS:  A 70 year old G2, P2 with initial  complaints of stenting, having bowel movements as well as pelvic  prolapse.  Feeling an egg between her legs.  She has, since her  initial appointment, been evaluated by gastroenterology and had a  colonoscopy and feels that her rectal symptoms are much better after  that.  She also had a cystometric study which revealed no stress urinary  incontinence; however, secondary to her prolapse, she is having a  difficult time emptying her bladder.   Past medical history is significant for hypertension as well as  arthritis.   Past surgical history is significant for anterior and posterior repair  approximately two years ago with a vault suspension.   PAST OB/GYN HISTORY:  She is a G2, P2 with a history of two term vaginal  deliveries.  She has no complaints of hot flashes or night sweats.  Her  menopause was at approximately age 71.  She has no history of any  abnormal Pap smears.  Her last was September 30, 2006, which was within  normal limits and negative for human papilloma virus.  No history of any  sexually transmitted diseases.  Her menarche was at age 22.  Menopause  was at age 67.   SOCIAL HISTORY:  She is approximately a pack-a-day smoker.  No alcohol  or drug use.  She is divorced and retired from [employer].   She has no known drug allergies.   MEDICATIONS:  1. Clonidine 0.2.  2.  Hydrochlorothiazide 12.5.  3. Verapamil 240.  4. Lisinopril 40.  5. Simvastatin 40.   PHYSICAL EXAMINATION:  VITAL SIGNS:  On exam, she is 5 feet 4 and weighs  167 pounds.  GENERAL:  No apparent distress.  CARDIOVASCULAR:  Regular rate and rhythm.  LUNGS:  Clear to auscultation bilaterally.  ABDOMEN:  Soft, obese, nontender, nondistended with positive bowel  sounds.  HEENT:  Mucous membranes are moist.  NECK:  No lymphadenopathy.  Thyroid within normal limits.  BREASTS:  No masses.  Nontender.  Approximately B-C cup.  PELVIC:  An atrophic introitus.  She is not sexually active.  Normal  Bartholin's, urethra, and Skene's.  The cervix was 3-4/4, descensus 2-3  grade cystocele, 1-2 grade rectocele.  Rectovaginal confirms exam.  Hemoccult is negative.  EXTREMITIES:  Symmetric and nontender.   Her hemoglobin in the office was 15.3.  As previously mentioned, she  underwent cystometric studies, which revealed the difficulty emptying  her bladder secondary to her prolapse.   ASSESSMENT/PLAN:  A  70 year old G2, P2 with pelvic prolapse for total  vaginal hysterectomy, bilateral salpingo-oophorectomy, possible anterior  and posterior repair, and cystoscopy.  I discussed with her the risks,  benefits and alternatives as well as the route of surgery.  She states  that she feels that total vaginal hysterectomy, bilateral salpingo-  oophorectomy with a possible anterior and posterior repair as well as  cystoscopy.  She understands the risks, benefits and alternatives and  wishes to proceed.  She stopped her aspirin the week before her surgery  and was to discussed with her general medical doctor her upcoming  surgery.      Sherron Monday, MD  Electronically Signed     JB/MEDQ  D:  12/26/2006  T:  12/26/2006  Job:  [id number]

## 2010-12-12 NOTE — Discharge Summary (Signed)
[name], [name]               ACCOUNT NO.:  [id number]   MEDICAL RECORD NO.:  [id number]          PATIENT TYPE:  INP   LOCATION:  1232                         FACILITY:  [facility]   PHYSICIAN:  Beckey Rutter, MD  DATE OF BIRTH:  [birth year]   DATE OF ADMISSION:  02/12/2007  DATE OF DISCHARGE:  02/14/2007                               DISCHARGE SUMMARY   PRIMARY CARE PHYSICIAN:  Dr. Susann Givens.   CHIEF COMPLAINT:  Vomiting of bright red blood.   HISTORY OF PRESENT ILLNESS:  This is a 70 year old female with  hematochezia.   HOSPITAL COURSE:  During the hospitalization, the patient was seen and  evaluated by GI and had EGD done on February 13, 2007. The assessment  showing abnormal examination with nonobstructive Schatzki ring.  She had  not complained of dysphagia and so this was not dilated.  This finding  was in the distal esophagus.  There was also a small hiatal hernia and  nonobstructing lower esophageal ring.  She has nonbloody emesis followed  by bloody emesis and so I suspect she had a small Mallory-Weiss tear  that has healed.  She was taking a lot of nonsteroidal anti-inflammatory  drugs as well as should be on empiric PPI once daily  especially if she  is continuing taking daily nonsteroidal anti-inflammatory medication.  Of note is that the patient had a colonoscopy on November 22, 2006 and the  result was brought in here by Dr. Melvia Heaps which is essentially  negative.   HOSPITAL CONSULTATION:  Gastroenterology, Dr. Arlyce Dice, seen by Dr. Christella Hartigan  during this hospitalization.   HOSPITAL PROCEDURE:  As outlined above, the result of the EGD done on  February 12, 2006.   FINAL DIAGNOSES:  1. Hematochezia/gastrointestinal bleeding likely secondary to viral      gastroenteritis versus Mallory-Weiss syndrome.  2. Hypertension.   DISCHARGE MEDICATIONS:  1. MiraLax.  2. Verapamil 240 mg p.o. daily.  3. Clonidine 0.2 mg p.o. b.i.d.  4. Lisinopril 10 mg p.o. daily.  5.  Hydrochlorothiazide 12.5 mg p.o. p.r.n.   DISCHARGE/PLAN:  The patient was seen today and evaluated by [facility]  and Dr. Christella Hartigan suggested that the patient is stable for discharge to  continue on MiraLax and to follow up with Dr. Arlyce Dice, her primary  gastroenterologist as discussed with her.  The patient is aware and  agreeable to the plan.   DISCHARGE LABS:  Today hemoglobin is 14.1, hematocrit 40.0.  Hepatic  function test done yesterday showing bilirubin total 1.0, alkaline  phosphatase 101, AST 33, ALT 23.  The slight elevation of cardiac  enzymes was noted and the patient aware of it.  The plan is to follow up  with primary gastroenterology for further testing as needed.  Her  hypokalemia was corrected, potassium today is 4.1.      Beckey Rutter, MD  Electronically Signed     EME/MEDQ  D:  02/14/2007  T:  02/14/2007  Job:  [id number]

## 2010-12-12 NOTE — Cardiovascular Report (Signed)
NAME:  CELESE, BANNER               ACCOUNT NO.:  [id number]   MEDICAL RECORD NO.:  [id number]          PATIENT TYPE:  INP   LOCATION:  6523                         FACILITY:  [facility]   PHYSICIAN:  Everardo Beals. Juanda Chance, MD, FACCDATE OF BIRTH:  04-12-1941   DATE OF PROCEDURE:  03/12/2007  DATE OF DISCHARGE:  03/13/2007                            CARDIAC CATHETERIZATION   PROCEDURE:  Percutaneous coronary intervention.   CLINICAL HISTORY:  Ms. Crocker is a 70 year old who was admitted with  unstable angina and underwent intervention of tandem lesions in the  proximal and proximal mid right coronary artery 2 days ago with three  overlapping Promus drug-eluting stents.  She developed a hematoma the  next morning when she ambulated with sudden onset of pain.  She also had  a small aneurysm which was compressed.   Her right femoral artery site looked pretty good this morning.  We  decided to proceed with intervention on the circumflex artery, which had  been planned as a staged intervention after her first intervention.   PROCEDURE:  The procedure was performed via the left femoral artery and  arterial sheath and a 6-French XB3 guiding catheter with side holes.  The patient was enrolled in the EMINENCE trial and was randomized to  EMINENCE study drug, which is a new low-molecular-weight heparin.  We  crossed the lesion in the proximal circumflex artery with a Prowater  wire without difficulty.  We predilated the lesion with 2.25 x 12 mm  Maverick, performing one inflation to 10 atmospheres for 20 seconds.  We  then deployed a 2.5 x 12 mm Promus stent, deploying this with one  inflation of 12 atmospheres for 20 seconds.  We post dilated initially  with a 2.5 x 8 mm Quantum Maverick performing two inflations to 16  atmospheres for 20 seconds each.  We then decided to dilate the distal  portion of the stent with a 3.0 x 8 mm Quantum Maverick and performed  one inflation to 16 atmospheres for 20  seconds.  Final diagnostic  studies were then performed through the guiding catheter.   The patient tolerated the procedure well and left the laboratory in  satisfactory condition.  The arterial entry site was below the  bifurcation, so the artery was not closed with Angio-Seal.   RESULTS:  Initially, the stenosis in the proximal circumflex artery was  estimated at 90%.  Following stenting this improved to 0%.   CONCLUSION:  Successful PCI of the lesion in the proximal circumflex  artery using a Promus drug-eluting stent with improvement in narrowing  from 90% to 0%.   DISPOSITION:  The patient __________ for further observation.  Will plan  to get the sheath out four hours from the time bolus.      Bruce Elvera Lennox Juanda Chance, MD, [facility]  Electronically Signed     BRB/MEDQ  D:  03/12/2007  T:  03/13/2007  Job:  [id number]   cc:   Madolyn Frieze. Jens Som, MD, [facility]  Bruce R. Juanda Chance, MD, [facility]

## 2010-12-12 NOTE — Op Note (Signed)
NAME:  [name], [name]               ACCOUNT NO.:  [id number]   MEDICAL RECORD NO.:  [id number]          PATIENT TYPE:  AMB   LOCATION:  SDC                           FACILITY:  [facility]   PHYSICIAN:  Sherron Monday, MD        DATE OF BIRTH:  1941-05-09   DATE OF PROCEDURE:  12/27/2006  DATE OF DISCHARGE:                               OPERATIVE REPORT   PREOPERATIVE DIAGNOSES:  1. Pelvic prolapse with cystocele.  2. Uterine prolapse, 4+/4.   POSTOPERATIVE DIAGNOSES:  1. Pelvic prolapse with cystocele.  2. Uterine prolapse, 4+/4.   PROCEDURES:  1. Total vaginal hysterectomy.  2. Anterior repair.  3. Cystoscopy.   SURGEON:  Sherron Monday, MD   ASSESSMENT:  Zenaida Niece, MD   ANESTHESIA:  General.   SPECIMENS:  Uterus and cervix.   ESTIMATED BLOOD LOSS:  Approximately 50 mL.   IV FLUIDS:  1300 mL.   URINE OUTPUT:  Approximately 100+ mL.   COMPLICATIONS:  None.   DISPOSITION:  Stable to the PACU.   PROCEDURE:  After informed consent was reviewed with the patient  including the risks, benefits, and alternatives of the surgical  procedure, she was transported to the operating room and placed on the  table in supine position.  General endotracheal anesthesia was induced  and found to be adequate.  She was then placed in the yellow fin  stirrups, prepped and draped in a normal sterile fashion.  Her bladder  was drained using a Foley catheter.  Attention was then turned to  performing the surgical procedure.  Using two Deavers, one anteriorly  and one posteriorly, her cervix was easily visualized at her introitus  and was grasped with Christella Hartigan tenaculums and infiltrated with Vasopressin.  This was then circumscribed using the Bovie cautery and an attempt was  made to enter the peritoneal cavity anteriorly.  This was unsuccessful  so attention was turned to the posterior, which again was unsuccessful.  The uterosacral ligaments were easily identified, grasped with curved  clamps,  doubly ligated and held.  After these ligaments were ligated, we  were able to enter posteriorly.  The hysterectomy conmtinued, isolating  the parametrium.  Then bilaterally ligated and found to be hemostatic.  Another clamp was placed at the cornu and the uterus was removed.  The  IP ligament was doubly ligated and hemostasis was assured.  Inspection  of the ovaries revealed atrophic, postmenopausal ovaries.  Prior to the  surgery this had been discussed with the patient.  Since there was no  problem, they were left in place.  The peritoneum was reapproximated  using 0 Vicryl.  The vagina was then inspected and a 2+ cystocele was  found with no rectocele.  The mucosa was separated using Metzenbaum  scissors and Allis sutures.  The dissection was performed revealing the  fascia, which was reapproximated using 2-0 Vicryl, and the mucosa was  trimmed and then reapproximated using 2-0 Vicryl.  The posterior portion  of the cuff was performed using 2-0 Vicryl.  Prior to the anterior  repair, the uterosacral ligaments were  plicated and the sutures that had  been held were tied together.  Following closure of the cuff, a  cystoscopy was performed after indigo carmine had been administered.  Blue was noted from both ureters and no stitches were noted in the  bladder.  A Foley catheter was placed and the vagina was packed.  The  sponge, lap and needle counts were correct x2 per the operating room  staff.  She tolerated the procedure well, was returned to supine  position and transferred in stable condition to the PACU.      Sherron Monday, MD  Electronically Signed     JB/MEDQ  D:  12/27/2006  T:  12/27/2006  Job:  [id number]

## 2010-12-12 NOTE — Discharge Summary (Signed)
NAME:  [name], [name]               ACCOUNT NO.:  [id number]   MEDICAL RECORD NO.:  [id number]          PATIENT TYPE:  INP   LOCATION:  6523                         FACILITY:  [facility]   PHYSICIAN:  Madolyn Frieze. Jens Som, MD, FACCDATE OF BIRTH:  1941/03/25   DATE OF ADMISSION:  03/07/2007  DATE OF DISCHARGE:  03/13/2007                               DISCHARGE SUMMARY   ADDENDUM   PRIMARY CARDIOLOGIST:  Dr. Olga Millers   PRIMARY CARE PHYSICIAN:  Dr. Susann Givens   The patient desired assistance in smoking cessation; a prescription for  Chantix was provided to the patient at 0.5 mg once a day x3 days, then  0.5 mg b.i.d. x4 days, then 1 mg b.i.d. for 11 weeks.  The patient has  also been given a prescription for Lopressor 25 mg b.i.d. which was not  listed on discharge summary previously dictated.  The patient will  continue to follow with Dr. Jens Som and Dr. Susann Givens for continued  medical management and cardiac management and evaluation of patient's  response to medications.  Smoking cessation counseling has been given by  myself along with cardiac rehab nurse.      Bettey Mare. Lyman Bishop, NP      Madolyn Frieze. Jens Som, MD, [facility]  Electronically Signed    KML/MEDQ  D:  03/13/2007  T:  03/13/2007  Job:  [id number]   cc:   Sharlot Gowda, M.D.

## 2010-12-15 NOTE — Op Note (Signed)
NAME:  [name], [name]                         ACCOUNT NO.:  [id number]   MEDICAL RECORD NO.:  [id number]                   PATIENT TYPE:  INP   LOCATION:  0442                                 [facility]ACILITY:  [facility]   PHYSICIAN:  Gretta Cool, M.D.              DATE OF BIRTH:  08-23-1940   DATE OF PROCEDURE:  04/04/2004  DATE OF DISCHARGE:                                 OPERATIVE REPORT   PREOPERATIVE DIAGNOSIS:  Grade 4 enterocele and rectocele, and vault  descensus.   POSTOPERATIVE DIAGNOSIS:  Grade 4 enterocele and rectocele, and vault  descensus.   PROCEDURE:  Posterior and enterocele repairs, and vault suspension.   SURGEON:  Gretta Cool, M.D.   ASSISTANT:  Raynald Kemp, M.D.   ANESTHESIA:  General orotracheal.   DESCRIPTION OF PROCEDURE:  Under excellent general anesthesia, as above,  with the patient prepped and draped in lithotomy position in [place] stirrups,  the vaginal mucosa was grasped with Allis clamps at the introitus.  The  mucosa was then incised and the mucosa undermined to the apex of the vaginal  cuff.  The cut edges of the incision were then grasped with Allis clamps on  each side  to apex of the vagina.  At this point, with the mucosa  undermined, with Xylocaine with epinephrine, the mucosa was then incised at  the cut edge until the avascular plane was reached and the mucosa dissected  by blunt and sharp dissection from the underlying endopelvic fascia.  Once  the mucosa was incised, a huge enterocele bulged out.  The enterocele was  then progressively repaired with a series of pursestring and mattress  sutures of 2-0 Novofil.  Once the enterocele sac was progressively managed,  the uterosacral-cardinal ligament complex was identified and sutures of 0  Novofil placed through the uterosacral-cardinal complex as high has  possible.  The sutures were then secured from that ligament to the detached  endopelvic fascia.  Once the endopelvic fascia was  plicated to the  uterosacral ligaments, the enterocele was completely obliterated and a  complete envelop of endopelvic fascia was then accomplished by interrupted  sutures of 2-0 Novofil used to secure the detached perirectal fascia to the  cervix and to the cardinal-uterosacral complex ligaments.  Once this had  been accomplished, there was complete obliteration of any fascial defect  that could be identified.  Next, a suture of 0 Vicryl was used to plicate  the posterior levator fascia from the apex of the cuff to the introitus.  At  this point, the mucosa was trimmed and the upper layers of endopelvic fascia  and the mucosa closed with a  running subcuticular closure of 2-0 Vicryl.  At this point, the perineal  body muscles were reapproximated and the perineal body closed with a running  suture of 2-0 Vicryl.  At this point, the sponge and lap counts were  correct, there  were no complications and patient returned to the recovery  room in excellent condition.                                               Gretta Cool, M.D.    CWL/MEDQ  D:  04/04/2004  T:  04/04/2004  Job:  [id number]   cc:   Sharlot Gowda, M.D.  [address]  [place], [place] [address]  Fax: [phone]

## 2010-12-15 NOTE — Assessment & Plan Note (Signed)
[facility]                              CARDIOLOGY OFFICE NOTE   [name], [name]                      MRN:          [id number]  DATE:03/25/2006                            DOB:          05-Mar-1941    [name] returns for followup today.  Please refer to my previous notes  for details.  Since we began her Niaspan she has complained of her heart  racing and flushing.  She otherwise denies any increased dyspnea.  She  occasionally feels tightness in her chest towards the latter parts of the  day but this is not exertional and lasts through the evening.  She  attributes this to smoking.  She otherwise does not have exertional chest  pain.  She is continuing to smoke.   MEDICATIONS:  1. Verapamil SA 240 mg p.o. b.i.d.  2. Hydrochlorothiazide 12.5 mg p.o. daily.  3. Lisinopril 40 mg q. daily.  4. Zocor 40 mg p.o. q. h.s.  5. Niaspan 500 mg p.o. q. day.  6. Clonidine 0.3 mg p.o. b.i.d.  7. Aspirin 81 mg p.o. daily.   PHYSICAL EXAMINATION:  VITAL SIGNS:  Blood pressure 158/88 and her pulse is  65.  She weighs 172 pounds.  CHEST:  Her chest is clear.  NECK:  Her neck is supple and there are bilateral carotid bruits.  CARDIOVASCULAR:  Reveals a regular rate and rhythm.  EXTREMITIES:  Show no edema.   Electrocardiogram shows a sinus rhythm at a rate of 65.  There are no ST  changes noted.   DIAGNOSES:  1. Hypertension.  2. Peripheral vascular disease.  3. Chronic obstructive pulmonary disease.  4. Tobacco abuse.  5. Hyperlipidemia.   PLAN:  1. [name] is complaining of heart racing and flushing with her      Niaspan and we will discontinue that medication.  Her blood pressure is      mildly elevated today but she states the systolic is in the 140 to 150      range with a diastolic of 70.  We will increase her Clonidine further      to 0.4 mg p.o. b.i.d. for optimal blood pressure control and she will      continue to track this at  home.  2. I know she has bilateral carotid bruits and I have asked her to have      carotid Dopplers performed.  3. She will need followup ultrasounds of her abdomen  in the future for      her small aneurysm.  4. We will give her a number for the smoking cessation clinic.  5 .  Finally we will check a BMET for her potassium and renal function given  hydrochlorothiazide  and ACE-inhibitor use.  1. I will see her back in six months.                              Madolyn Frieze Jens Som, MD, [facility]    BSC/MedQ  DD:  03/25/2006  DT:  03/25/2006  Job #:  [id number]   cc:   Sharlot Gowda, MD

## 2010-12-15 NOTE — Assessment & Plan Note (Signed)
[facility]                            CARDIOLOGY OFFICE NOTE   EVERLENA, MACKLEY                      MRN:          [id number]  DATE:08/28/2006                            DOB:          [birth year]    Mrs. Shimkus returns for followup today.  Since I last saw her, she has  mild dyspnea on exertion, but attributes this to her history of tobacco  use.  She occasionally feels chest tightness, predominantly when she is  lying flat.  She also feels her heart pound.  It appears that this is  more secondary to hard heartbeat.  She has not had exertional chest  pain.  She is continuing to smoke.   MEDICATIONS INCLUDE:  1. Verapamil SA 240 mg p.o. b.i.d.  2. Hydrochlorothiazide 12.5 mg p.o. daily.  3. Lisinopril 40 mg p.o. daily.  4. Aspirin 81 mg p.o. daily.  5. Clonidine 0.2 mg tablets 2 p.o. b.i.d.   PHYSICAL EXAM:  Shows a blood pressure of 159/85 and her pulse was 91.  NECK:  Supple.  CHEST:  Clear.  CARDIOVASCULAR EXAM:  Regular rate and rhythm.  EXTREMITIES:  Show no edema.   Her electrocardiogram shows a sinus rhythm at a rate of 91.  There are  no ST changes noted.   DIAGNOSES:  1. Chest tightness.  2. Hypertension.  3. Peripheral vascular disease.  4. Small abdominal aortic aneurysm.  5. Mild cerebrovascular disease.  6. COPD.  7. Tobacco abuse.  8. Hyperlipidemia.   PLAN:  Mrs. Isaac is complaining of mild chest tightness.  She also  has some dyspnea on exertion and has vascular disease other places.  We  will plan to proceed with an adenosine Myoview for risk stratification.  We will repeat her ultrasound of her abdomen to follow up on her  aneurysm.  She will need followup carotid Dopplers in August of 2008.  I  have asked that she resume her Zocor at 40 mg p.o. q.h.s.  We will check  a CMET and lipids in six weeks.  We again discussed the importance of  discontinuing her tobacco use.  Note:  Her blood pressure is mildly  elevated today, but she states it typically runs in the 130-140 range  over 70-80 range at home.  She will continue to track this and we will adjust her medications as  indicated.  I will see her back in six months.     Madolyn Frieze Jens Som, MD, [facility]  Electronically Signed    BSC/MedQ  DD: 08/28/2006  DT: 08/28/2006  Job #: [id number]   cc:   Sharlot Gowda, M.D.

## 2010-12-15 NOTE — Discharge Summary (Signed)
NAME:  [name], [name]               ACCOUNT NO.:  [id number]   MEDICAL RECORD NO.:  [id number]          PATIENT TYPE:  INP   LOCATION:  0442                         FACILITY:  [facility]   PHYSICIAN:  Gretta Cool, M.D. DATE OF BIRTH:  [birth year]   DATE OF ADMISSION:  04/04/2004  DATE OF DISCHARGE:  04/05/2004                                 DISCHARGE SUMMARY   HISTORY OF PRESENT ILLNESS:  [name] is a 62-year-olf female, gravida 4,  AB 2, para 2, who has well-documented, severe, posterior pelvic support  issues with extreme weakness at level 1 and 2 with a grade 4 enterocele and  rectocele.  She has severe fascial detachment with levator diastasis and the  anterior vaginal wall is reasonably well-supported.  Level 3 support is  reasonably intact.  She is now admitted for definitive therapy by posterior  and enterocele repairs and vaginal involved suspension.   PHYSICAL EXAMINATION:  CHEST:  Clear to A&P.  HEART:  Rate and rhythm were regular without murmur, gallop, or cardiac  enlargement.  ABDOMEN:  Soft with enlarged panniculus without evidence of masses or  organomegaly.  PELVIC:  External genitalia within normal limits for females.  Vagina:  There is a large bulge of rectocele and enterocele with exceedingly poor  posterior pelvic support.  There is detachment of the posterior rectal  fascia from the apex of the vagina.  She has an attenuated levator fascia.  The bladder support is reasonably well-preserved.  Uterus is small in size,  normal shape, size, and contour.  Rectovaginal exam confirms.   IMPRESSION:  1.  Severe posterior pelvic support problems with extreme weakness at level      1 and 2 and a grade 4 enterocele and rectocele.  2.  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  3.  Chronic obstructive pulmonary disease.  4.  Prolonged history of tobacco use.  5.  Hypertension, relatively poor-controlled.   PLAN:  Posterior and enterocele repairs,  vault suspension, and prophylaxis  for thromboembolic disease due to heavy smoking.  This will include  PAS  stockings, early ambulation, and pulmonary management to prevent  atelectasis.  She was instructed to discontinue nonsteroidals and reduce  smoking to no more than 3-5 cigarettes a day, to increase ambulation at  least 10-15 minutes a day prior to surgery.  Risks and benefits have been  discussed with the patient and she accepts these procedures.  She is now  admitted for the above procedures.   LABORATORY DATA:  Admission hemoglobin 15/4, hematocrit 43.1, on the first  postoperative day hemoglobin was 14, hematocrit 40.7.  Routine chemistries  were within normal limits.   Chest x-ray:  Fibrotic changes noted in the lung fields.  No active  findings.   HOSPITAL COURSE:  The patient underwent  posterior and enterocele repairs  and vault suspension under general anesthesia.  The procedures were  completed without any complications and the patient was returned to the  recovery room in excellent condition.  Her postoperative course was without  complications and she was discharged on the first postoperative  day.  Suprapubic catheter was discontinued as the patient was 4300 cc with  residuals of 125 or less x2.   FINAL DISCHARGE INSTRUCTIONS:   ACTIVITY:  No heavy lifting or straining, no vaginal entrance, and increased  ambulation as tolerated.   FOLLOW UP:  She is to call for any fever over 100.5 or for daily  improvement.   DIET:  Regular.   MEDICATIONS:  1.  Tylox.  2.  Celebrex as needed.   SPECIAL INSTRUCTIONS:  She was also instructed to have a daily bowel  movement using milk of magnesia or Dulcolax suppository as necessary.   FOLLOW UP:  She is to return to the office in one week for follow-up.   CONDITION ON DISCHARGE:  Excellent.   FINAL DISCHARGE DIAGNOSIS:  Grade 4 enterocele and rectocele and vaginal  vault descensus.   PROCEDURES PERFORMED:  Posterior  and enterocele repairs under general  anesthesia.      EMK/MEDQ  D:  05/15/2004  T:  05/15/2004  Job:  [id number]   cc:   Sharlot Gowda, M.D.  [address]  [place], [place] [address]  Fax: [phone]

## 2010-12-15 NOTE — Assessment & Plan Note (Signed)
[facility]                         GASTROENTEROLOGY OFFICE NOTE   [name], [name]                      MRN:          [id number]  DATE:11/14/2006                            DOB:          [birth year]    PROBLEM:  Heme-positive stool.  Mrs. Kruger is a 70 year old white  female referred through the courtesy of Drs. Bovard and Lalonde for  evaluation.  Routine stool testing demonstrated heme-positive stool.  Mrs. Lindseth' only GI complaint is mild constipation which she has  realized over the past year.  With straining, she may see minimal  amounts of blood on the toilet tissue.  She is without abdominal pain.  She does not take NSAIDs regularly.   PAST MEDICAL HISTORY:  Pertinent for hypertension and arthritis.  She is  status post tubal ligation.   FAMILY HISTORY:  Pertinent for mother with breast cancer.  Father had  leukemia.   MEDICATIONS:  Verapamil.  Hydrochlorothiazide.  Lisinopril.  Baby  aspirin.  Clonidine.   She has no allergies.   She smokes.  She does not drink.  She is retired and divorced.   REVIEW OF SYSTEMS:  Positive for joint pains and excessive thirst.   EXAM:  Pulse 88, blood pressure 162/108, weight 169.  HEENT:  EOMI. PERRLA. Sclerae are anicteric.  Conjunctivae are pink.  NECK:  Supple without thyromegaly, adenopathy or carotid bruits.  CHEST:  Clear to auscultation and percussion without adventitious  sounds.  CARDIAC:  Regular rhythm; normal S1 S2.  There are no murmurs, gallops  or rubs.  ABDOMEN:  Bowel sounds are normoactive.  Abdomen is soft, non-tender and  non-distended.  There are no abdominal masses, tenderness, splenic  enlargement or hepatomegaly.  EXTREMITIES:  Full range of motion.  No cyanosis, clubbing or edema.  RECTAL:  Deferred.   IMPRESSION:  Heme-positive stool.  Etiologies may include hemorrhoids,  polyps, arteriovenous malformation, and neoplasm.  An upper  gastrointestinal source is also  a consideration.   RECOMMENDATION:  Colonoscopy.  If negative, I will obtain a followup  Hemoccult.  If persistently positive, I would proceed with upper  endoscopy.     Barbette Hair. Arlyce Dice, MD,FACG  Electronically Signed    RDK/MedQ  DD: 11/14/2006  DT: 11/14/2006  Job #: [id number]   cc:   Sherron Monday, MD  Sharlot Gowda, M.D.  Madolyn Frieze Jens Som, MD, [facility]

## 2010-12-15 NOTE — H&P (Signed)
NAME:  [name], [name]                         ACCOUNT NO.:  [id number]   MEDICAL RECORD NO.:  [id number]                   PATIENT TYPE:  INP   LOCATION:  NA                                   FACILITY:  [facility]   PHYSICIAN:  Gretta Cool, M.D.              DATE OF BIRTH:  1940/12/26   DATE OF ADMISSION:  04/04/2004  DATE OF DISCHARGE:                                HISTORY & PHYSICAL   CHIEF COMPLAINT:  Pelvic organ prolapse.   HISTORY OF PRESENT ILLNESS:  [age] year old G4, P2, AB 2 under the  primary care of Dr. Sharlot Gowda.  She has well-documented severe posterior  pelvic support issues with extreme weakness at level 1 and 2 with a grade 4  enterocele and rectocele.  She has severe fascial detachment with levator  diastasis.  Her anterior vaginal wall is reasonably well supported.  Her  level 3 support is also reasonably intact.  She is now admitted for  definitive therapy by posterior and enterocele repairs and vault suspension.   PAST MEDICAL HISTORY:  Usual childhood disease without sequelae.   MEDICAL ILLNESSES:  Hypertension on verapamil and bisoprolol/HCT 10/6.25.  She also has significant osteoarthritis.  She smokes one pack daily.  She  has significant increased AP diameter of her chest with 40 pack-year  history.   SOCIAL HISTORY:  Patient is divorced and has two children grown and out of  the home.   FAMILY HISTORY:  Father died of leukemia.  Mother died of breast cancer.  One sister died of CVA.  One brother died of Lou Gehrig's disease.  One  brother with bone cancer.   She has no other known familial tendency other than cancer risks as above.   Her last menstrual period was 68.  She is not sexually active and does not  foresee return to sexual activity.   REVIEW OF SYSTEMS:  HEENT:  Denies symptoms.  CARDIORESPIRATORY:  Denies  asthma, cough, bronchitis, shortness of breath.  GI/GU:  Denies frequency,  urgency, dysuria, change in bowel habits, food  intolerance.   PHYSICAL EXAMINATION:  GENERAL:  Well-developed short white female  moderately over ideal weight with blood pressure elevation to 182/92.  She  has very heavy odor of tobacco smoke.  HEENT:  Pupils equal, react to light and accommodate.  Fundi not examined.  Oropharynx clear.  Neck supple without mass, thyroid enlargement, node-  bearing areas are negative.  CHEST:  Clear P to A but increased AP diameter of her chest.  She does not  have any significant rales or rhonchi or wheezing.  HEART:  Regular rhythm without murmur or cardiac enlargement.  BREASTS:  Without mass, nodes, or nipple discharge.  ABDOMEN:  Soft with an enlarged panniculus without evidence of mass or  organomegaly.  PELVIC:  External genitalia normal female.  Vagina there is a large bulge of  rectocele and enterocele  with exceedingly poor posterior support.  She has  severe detachment of the posterior rectal fascia from the apex of the  vagina.  She has extremely attenuated levator fascia.  Her bladder support  is reasonably well preserved in spite of the other posterior support injury.  Her uterus is small in size, normal contour, rectovaginal confirms, adnexa  clear.  EXTREMITIES:  Negative.  NEUROLOGIC:  Physiologic.   IMPRESSIONS:  1.  Severe posterior pelvic support problems with extreme weakness of level      1 and 2 with grade 4 enterocele and rectocele.  2.  Severe detachment of the levator fascia with levator diastasis.  3.  Chronic obstructive pulmonary disease.  4.  Prolonged history of tobacco use.  5.  Hypertension relatively poor control.   PLAN:  Plan on posterior and enterocele repairs, vault suspension, we will  plan prophylaxis for thromboembolic disease with PAS stockings and early  ambulation, we will also plan pulmonary management to prevent atelectasis  and lung concerns.  She is to stop nonsteroidals now, tobacco no more than 3-  5 cigarettes per day, increased ambulation  daily 2-3 times a day at least 10-  15 minutes.  I have explained risks, benefits, alternative therapies all  including no therapy.  She is now admitted for definitive management.                                               Gretta Cool, M.D.    CWL/MEDQ  D:  03/31/2004  T:  03/31/2004  Job:  [id number]   cc:   Sharlot Gowda, M.D.  [address]  [place], [place] [address]  Fax: [phone]

## 2010-12-15 NOTE — Op Note (Signed)
NAME:  [name], [name]                         ACCOUNT NO.:  [id number]   MEDICAL RECORD NO.:  [id number]                   PATIENT TYPE:  AMB   LOCATION:  DAY                                  FACILITY:  [facility]   PHYSICIAN:  Marlowe Kays, M.D.               DATE OF BIRTH:  10-08-1940   DATE OF PROCEDURE:  08/11/2002  DATE OF DISCHARGE:                                 OPERATIVE REPORT   PREOPERATIVE DIAGNOSES:  1. Torn medial meniscus.  2. Tricompartment arthritis, left knee.   POSTOPERATIVE DIAGNOSES:  1. Torn medial and lateral menisci.  2. Tricompartment degenerative arthritis, left knee.   OPERATION:  Left knee arthroscopy with 1) partial medial lateral  meniscectomy, 2) debridement of medial and lateral femoral condyle.   SURGEON:  Marlowe Kays, M.D.   ASSISTANT:  Nurse.   ANESTHESIA:  General.   PATHOLOGY AND JUSTIFICATION FOR PROCEDURE:  She has had some long standing  pain in her left knee with plain x-rays demonstrating some degenerative  changes. An MRI demonstrated tricompartment arthritis with extensive tear of  the medial meniscus. See operative description below for additional details.  She is here today for correction of the torn menisci with debridement of the  arthritic changes as indicated that the cause for the surgery is the torn  meniscus.   DESCRIPTION OF PROCEDURE:  Satisfactory general anesthesia, pneumatic  tourniquet, thigh stabilizer, the left knee was prepped with Duraprep,  draped in a sterile field, superior and medial saline inflow. First through  an anterior lateral portal, the medial compartment of the knee joint was  evaluated. She had some modest grade 2/4 chondromalacia of the medial  femoral condyle but had full thickness chondromalacia with raw bone present  over the posterior medial portion of the medial tibial plateau. This was  associated with severe tearing of most of the medial meniscus from the  junction of the anterior mid  third into the intercondylar area. I resected  this back to a stable rim with a combination of baskets, small scissors and  3.5 and 4.2 shavers. I also debrided an area of reactive synovitis in the  intercondylar area which I also cauterized with underwater Bovie. Her ACL  was intact. Looking up in the medial gutter and suprapatellar area, her  patella showed some mild wear but nothing that was arthroscopically  treatable. I then reversed portals, shaved a good bit of synovitis laterally  and over the extreme lateral portion I found at least three osteochondral  fragments in the joint with pictures removed with the pituitary and these  appeared to emanate from the defect in the lateral femoral condyle which I  debrided down with shavers and a picture. The lateral meniscus was generally  disrupted throughout its entire inner border and I shaved this down with a  3.5 shaver. This exposed some full thickness wear of the posterior lateral  tibial plateau. After  cleaning all of this up, the knee joint was then  irrigated unclear clear and all fluid possible removed and the two anterior  portals were closed with 4-0 nylon. The 20 cc of 0.5% Marcaine with  adrenaline, 4 mg of morphine were then instilled through the inflap rasp  which was removed and this portal closed with 4-0 nylon as well. Betadine  Adaptic dry sterile dressing was applied, tourniquet was released. She  tolerated this procedure well and at the time of this dictation was on her  way to the recovery room in satisfactory condition with no known  complications.                                               Marlowe Kays, M.D.    JA/MEDQ  D:  08/11/2002  T:  08/11/2002  Job:  [id number]

## 2011-01-26 ENCOUNTER — Other Ambulatory Visit: Payer: Self-pay | Admitting: Cardiology

## 2011-01-30 ENCOUNTER — Other Ambulatory Visit: Payer: Self-pay | Admitting: Cardiology

## 2011-04-05 ENCOUNTER — Other Ambulatory Visit: Payer: Self-pay | Admitting: *Deleted

## 2011-04-05 MED ORDER — HYDROCHLOROTHIAZIDE 12.5 MG PO CAPS: 12.5000 mg | ORAL_CAPSULE | ORAL | Status: DC

## 2011-04-05 MED ORDER — LISINOPRIL 40 MG PO TABS: 40.0000 mg | ORAL_TABLET | Freq: Every day | ORAL | Status: DC

## 2011-04-17 ENCOUNTER — Other Ambulatory Visit: Payer: Self-pay | Admitting: Cardiology

## 2011-05-02 ENCOUNTER — Other Ambulatory Visit: Payer: Self-pay

## 2011-05-02 MED ORDER — AMLODIPINE BESYLATE 10 MG PO TABS: 10.0000 mg | ORAL_TABLET | Freq: Every day | ORAL | Status: DC

## 2011-05-14 LAB — COMPREHENSIVE METABOLIC PANEL WITH GFR
ALT: 20
AST: 24
Albumin: 3.1 — ABNORMAL LOW
Alkaline Phosphatase: 84
BUN: 3 — ABNORMAL LOW
CO2: 28
Calcium: 8.8
Chloride: 106
Creatinine, Ser: 0.8
GFR calc non Af Amer: >60
Glucose, Bld: 107 — ABNORMAL HIGH
Potassium: 4.1
Sodium: 141
Total Bilirubin: 0.6
Total Protein: 6

## 2011-05-14 LAB — CARDIAC PANEL(CRET KIN+CKTOT+MB+TROPI)
CK, MB: 1.2
CK, MB: 1.3
CK, MB: 3
Relative Index: INVALID
Relative Index: INVALID
Total CK: 49
Total CK: 77
Total CK: 81
Troponin I: 0.01
Troponin I: 0.01

## 2011-05-14 LAB — BASIC METABOLIC PANEL
BUN: 10
BUN: 16
BUN: 6
CO2: 26
CO2: 27
CO2: 27
Calcium: 9
Calcium: 9.6
Chloride: 104
Chloride: 109
Creatinine, Ser: 0.69
GFR calc Af Amer: >60
GFR calc non Af Amer: 51 — ABNORMAL LOW
GFR calc non Af Amer: >60
Glucose, Bld: 109 — ABNORMAL HIGH
Glucose, Bld: 123 — ABNORMAL HIGH
Potassium: 3.5
Potassium: 3.9
Sodium: 136
Sodium: 141
Sodium: 142

## 2011-05-14 LAB — COMPREHENSIVE METABOLIC PANEL
ALT: 21
ALT: 28
AST: 22
AST: 24
Albumin: 3.6
Alkaline Phosphatase: 72
BUN: 12
CO2: 26
CO2: 27
Calcium: 10.1
Chloride: 106
Chloride: 107
Creatinine, Ser: 0.8
GFR calc Af Amer: 58 — ABNORMAL LOW
GFR calc Af Amer: >60
GFR calc non Af Amer: 48 — ABNORMAL LOW
GFR calc non Af Amer: >60
Glucose, Bld: 124 — ABNORMAL HIGH
Glucose, Bld: 137 — ABNORMAL HIGH
Glucose, Bld: 159 — ABNORMAL HIGH
Sodium: 140
Sodium: 137
Total Bilirubin: 0.5
Total Bilirubin: 0.6
Total Protein: 6.5

## 2011-05-14 LAB — CBC
HCT: 29.3 — ABNORMAL LOW
HCT: 37.7
HCT: 41.5
HCT: 44.3
Hemoglobin: 10.4 — ABNORMAL LOW
Hemoglobin: 11.5 — ABNORMAL LOW
Hemoglobin: 14.1
Hemoglobin: 14.3
Hemoglobin: 9.4 — ABNORMAL LOW
Hemoglobin: 14.1
MCHC: 35.3
MCHC: 36
MCHC: 36.8 — ABNORMAL HIGH
MCHC: 37.8 — ABNORMAL HIGH
MCHC: 34.9
MCHC: 35.3
MCHC: 35.4
MCV: 93.1
MCV: 96.8
MCV: 98.6
MCV: 93.9
MCV: 94.3
Platelets: 192
Platelets: 239
Platelets: 245
Platelets: 219
Platelets: 265
RBC: 2.85 — ABNORMAL LOW
RBC: 3.08 — ABNORMAL LOW
RBC: 3.82 — ABNORMAL LOW
RBC: 3.98
RBC: 4.46
RBC: 4.24
RDW: 12.6
RDW: 12.6
RDW: 12.6
RDW: 12.8
RDW: 12.6
RDW: 13.1
WBC: 6.3
WBC: 6.5
WBC: 7.8
WBC: 8.1
WBC: 11.9 — ABNORMAL HIGH
WBC: 12.3 — ABNORMAL HIGH

## 2011-05-14 LAB — RETICULOCYTES
RBC.: 3.25 — ABNORMAL LOW
Retic Count, Absolute: 55.3

## 2011-05-14 LAB — TSH
TSH: 1.958
TSH: 2.401

## 2011-05-14 LAB — PROTIME-INR
INR: 0.9
INR: 0.9
INR: 1
INR: 1.1
Prothrombin Time: 12.8
Prothrombin Time: 13.7
Prothrombin Time: 14

## 2011-05-14 LAB — DIFFERENTIAL
Basophils Relative: 1
Eosinophils Absolute: 0.1
Eosinophils Absolute: 0.2
Lymphs Abs: 2.4
Monocytes Absolute: 0.3
Monocytes Relative: 1 — ABNORMAL LOW
Neutrophils Relative %: 72
Neutrophils Relative %: 85 — ABNORMAL HIGH

## 2011-05-14 LAB — URINALYSIS, ROUTINE W REFLEX MICROSCOPIC
Bilirubin Urine: NEGATIVE
Glucose, UA: NEGATIVE
Ketones, ur: NEGATIVE
Leukocytes, UA: NEGATIVE
Nitrite: NEGATIVE
Protein, ur: NEGATIVE
Specific Gravity, Urine: 1.011
Urobilinogen, UA: 0.2
pH: 7

## 2011-05-14 LAB — HEMOGLOBIN AND HEMATOCRIT, BLOOD
HCT: 42.4
HCT: 43
HCT: 44.5
Hemoglobin: 15.1 — ABNORMAL HIGH
Hemoglobin: 15.2 — ABNORMAL HIGH
Hemoglobin: 15.5 — ABNORMAL HIGH

## 2011-05-14 LAB — ABO/RH: ABO/RH(D): O POS

## 2011-05-14 LAB — HEPARIN LEVEL (UNFRACTIONATED)
Heparin Unfractionated: 0.42
Heparin Unfractionated: 0.56

## 2011-05-14 LAB — CK TOTAL AND CKMB (NOT AT ARMC): CK, MB: 2.1

## 2011-05-14 LAB — HEPATIC FUNCTION PANEL
AST: 33
Albumin: 3.4 — ABNORMAL LOW
Bilirubin, Direct: 0.3

## 2011-05-14 LAB — TYPE AND SCREEN: ABO/RH(D): O POS

## 2011-05-14 LAB — OCCULT BLOOD X 1 CARD TO LAB, STOOL: Fecal Occult Bld: POSITIVE

## 2011-05-14 LAB — HEPATITIS PANEL, ACUTE
HCV Ab: NEGATIVE
Hep A IgM: NEGATIVE

## 2011-05-14 LAB — APTT
aPTT: 34
aPTT: 26

## 2011-05-14 LAB — URINE CULTURE: Special Requests: NEGATIVE

## 2011-05-14 LAB — URINE MICROSCOPIC-ADD ON

## 2011-06-01 ENCOUNTER — Encounter: Payer: Self-pay | Admitting: *Deleted

## 2011-06-04 ENCOUNTER — Ambulatory Visit (INDEPENDENT_AMBULATORY_CARE_PROVIDER_SITE_OTHER): Payer: Medicare Other | Admitting: Cardiology

## 2011-06-04 ENCOUNTER — Encounter: Payer: Self-pay | Admitting: Cardiology

## 2011-06-04 DIAGNOSIS — F17200 Nicotine dependence, unspecified, uncomplicated: Secondary | ICD-10-CM

## 2011-06-04 DIAGNOSIS — I10 Essential (primary) hypertension: Secondary | ICD-10-CM

## 2011-06-04 DIAGNOSIS — I723 Aneurysm of iliac artery: Secondary | ICD-10-CM

## 2011-06-04 DIAGNOSIS — I2510 Atherosclerotic heart disease of native coronary artery without angina pectoris: Secondary | ICD-10-CM

## 2011-06-04 MED ORDER — HYDROCHLOROTHIAZIDE 12.5 MG PO CAPS: 12.5000 mg | ORAL_CAPSULE | ORAL | Status: DC

## 2011-06-04 MED ORDER — LISINOPRIL 40 MG PO TABS: 40.0000 mg | ORAL_TABLET | Freq: Every day | ORAL | Status: DC

## 2011-06-04 MED ORDER — AMLODIPINE BESYLATE 10 MG PO TABS: 10.0000 mg | ORAL_TABLET | Freq: Every day | ORAL | Status: DC

## 2011-06-04 NOTE — Assessment & Plan Note (Signed)
Continue aspirin and statin. Followup carotid Dopplers February 2013. 

## 2011-06-04 NOTE — Assessment & Plan Note (Signed)
Continue aspirin and statin. Followup Myoview for risk stratification.

## 2011-06-04 NOTE — Assessment & Plan Note (Addendum)
No evidence on most recent ultrasound.

## 2011-06-04 NOTE — Assessment & Plan Note (Signed)
Continue statin. Check lipids and liver. 

## 2011-06-04 NOTE — Assessment & Plan Note (Signed)
Patient counseled on discontinuing. 

## 2011-06-04 NOTE — Patient Instructions (Signed)
Your physician wants you to follow-up in: one year You will receive a reminder letter in the mail two months in advance. If you don't receive a letter, please call our office to schedule the follow-up appointment.   Your physician has requested that you have a lexiscan myoview. For further information please visit [email or web address] Please follow instruction sheet, as given.   Your physician recommends that you return for lab work AT THE TIME OF STRESS TEST

## 2011-06-04 NOTE — Progress Notes (Signed)
HPI:[name] is a pleasant female who has had a history of coronary artery disease.  She is status post PCI of her circumflex and right coronary artery with drug-eluting stents in August 2008.   A Myoview was performed on October 10, 2007, that was normal and an ejection fraction was 77%.  Last carotid Dopplers were performed in feb 2012. There was 40-59% left stenosis and 0-39% right and followup was recommended in one year. Previous abdominal ultrasound in July of 2012 showed no significant dilatation.  There  was a prior iliac aneurysm on the right appreciated by CT in 2007. Since I last saw her in June of 2010, the patient has dyspnea with more extreme activities but not with routine activities. It is relieved with rest. It is not associated with chest pain. There is no orthopnea, PND or pedal edema. There is no syncope or palpitations. There is no exertional chest pain.   Current Outpatient Prescriptions  Medication Sig Dispense Refill  . amLODipine (NORVASC) 10 MG tablet Take 1 tablet (10 mg total) by mouth daily.  30 tablet  0  . aspirin 81 MG tablet Take 81 mg by mouth daily.        [name] atorvastatin (LIPITOR) 40 MG tablet TAKE ONE TABLET BY MOUTH DAILY.  90 tablet  2  . carvedilol (COREG) 6.25 MG tablet TAKE 1 TABLET BY MOUTH TWO TIMES A DAY  60 tablet  12  . cloNIDine (CATAPRES) 0.2 MG tablet Take 0.2 mg by mouth 2 (two) times daily.        . hydrochlorothiazide (,MICROZIDE/HYDRODIURIL,) 12.5 MG capsule Take 1 capsule (12.5 mg total) by mouth every morning.  30 capsule  1  . lisinopril (PRINIVIL,ZESTRIL) 40 MG tablet Take 1 tablet (40 mg total) by mouth daily.  30 tablet  1     Past Medical History  Diagnosis Date  . CAD (coronary artery disease)   . PVD (peripheral vascular disease)   . HTN (hypertension)   . Hyperlipidemia   . Cerebrovascular disease   . COPD (chronic obstructive pulmonary disease)     Past Surgical History  Procedure Date  . Knee surgery     History    Social History  . Marital Status: Single    Spouse Name: N/A    Number of Children: N/A  . Years of Education: N/A   Occupational History  . Not on file.   Social History Main Topics  . Smoking status: Current Everyday Smoker  . Smokeless tobacco: Not on file  . Alcohol Use: No  . Drug Use: No  . Sexually Active: Not on file   Other Topics Concern  . Not on file   Social History Narrative  . No narrative on file    ROS: some knee arthralgias but no fevers or chills, productive cough, hemoptysis, dysphasia, odynophagia, melena, hematochezia, dysuria, hematuria, rash, seizure activity, orthopnea, PND, pedal edema, claudication. Remaining systems are negative.  Physical Exam: Well-developed well-nourished in no acute distress.  Skin is warm and dry.  HEENT is normal.  Neck is supple. No thyromegaly. Bilateral bruits Chest is diminished breath sounds throughout Cardiovascular exam is regular rate and rhythm.  Abdominal exam nontender or distended. No masses palpated. Extremities show no edema. neuro grossly intact  ECG normal sinus rhythm at a rate of 60. No ST changes. First degree AV block.

## 2011-06-04 NOTE — Assessment & Plan Note (Signed)
Continue present medications. Check potassium and renal function. 

## 2011-07-02 ENCOUNTER — Ambulatory Visit (HOSPITAL_COMMUNITY): Payer: Medicare Other | Attending: Cardiology | Admitting: Radiology

## 2011-07-02 ENCOUNTER — Other Ambulatory Visit (INDEPENDENT_AMBULATORY_CARE_PROVIDER_SITE_OTHER): Payer: Medicare Other | Admitting: *Deleted

## 2011-07-02 VITALS — Ht 63.5 in | Wt 154.0 lb

## 2011-07-02 DIAGNOSIS — F17200 Nicotine dependence, unspecified, uncomplicated: Secondary | ICD-10-CM | POA: Insufficient documentation

## 2011-07-02 DIAGNOSIS — I739 Peripheral vascular disease, unspecified: Secondary | ICD-10-CM | POA: Insufficient documentation

## 2011-07-02 DIAGNOSIS — I10 Essential (primary) hypertension: Secondary | ICD-10-CM

## 2011-07-02 DIAGNOSIS — E785 Hyperlipidemia, unspecified: Secondary | ICD-10-CM | POA: Insufficient documentation

## 2011-07-02 DIAGNOSIS — J4489 Other specified chronic obstructive pulmonary disease: Secondary | ICD-10-CM | POA: Insufficient documentation

## 2011-07-02 DIAGNOSIS — R0609 Other forms of dyspnea: Secondary | ICD-10-CM | POA: Insufficient documentation

## 2011-07-02 DIAGNOSIS — J449 Chronic obstructive pulmonary disease, unspecified: Secondary | ICD-10-CM | POA: Insufficient documentation

## 2011-07-02 DIAGNOSIS — I2510 Atherosclerotic heart disease of native coronary artery without angina pectoris: Secondary | ICD-10-CM | POA: Insufficient documentation

## 2011-07-02 DIAGNOSIS — I440 Atrioventricular block, first degree: Secondary | ICD-10-CM

## 2011-07-02 DIAGNOSIS — I779 Disorder of arteries and arterioles, unspecified: Secondary | ICD-10-CM | POA: Insufficient documentation

## 2011-07-02 DIAGNOSIS — R0989 Other specified symptoms and signs involving the circulatory and respiratory systems: Secondary | ICD-10-CM | POA: Insufficient documentation

## 2011-07-02 DIAGNOSIS — R079 Chest pain, unspecified: Secondary | ICD-10-CM | POA: Insufficient documentation

## 2011-07-02 LAB — BASIC METABOLIC PANEL
CO2: 24 mEq/L (ref 19–32)
Chloride: 104 mEq/L (ref 96–112)
Potassium: 4.6 mEq/L (ref 3.5–5.1)
Sodium: 137 mEq/L (ref 135–145)

## 2011-07-02 LAB — LIPID PANEL
Cholesterol: 127 mg/dL (ref 0–200)
HDL: 43.1 mg/dL (ref >39.00)
LDL Cholesterol: 70 mg/dL (ref 0–99)
Total CHOL/HDL Ratio: 3
Triglycerides: 72 mg/dL (ref 0.0–149.0)
VLDL: 14.4 mg/dL (ref 0.0–40.0)

## 2011-07-02 MED ORDER — REGADENOSON 0.4 MG/5ML IV SOLN
0.4000 mg | Freq: Once | INTRAVENOUS | Status: AC
  Administered 2011-07-02: 0.4 mg via INTRAVENOUS

## 2011-07-02 MED ORDER — TECHNETIUM TC 99M TETROFOSMIN IV KIT
11.0000 | PACK | Freq: Once | INTRAVENOUS | Status: AC | PRN
  Administered 2011-07-02: 11 via INTRAVENOUS

## 2011-07-02 MED ORDER — TECHNETIUM TC 99M TETROFOSMIN IV KIT
33.0000 | PACK | Freq: Once | INTRAVENOUS | Status: AC | PRN
  Administered 2011-07-02: 33 via INTRAVENOUS

## 2011-07-02 NOTE — Progress Notes (Signed)
[facility] SITE 3 NUCLEAR MED [address] [place] [address] [phone]  Cardiology Nuclear Med Study  [name] is a 70 y.o. female [id number] [birth year]   Nuclear Med Background Indication for Stress Test:  Evaluation for Ischemia, Stent Patency;1AVB History:  COPD,06' Echo-EF65%nrml,08' Heart Catheterization-40%LAD-0EF,09' Myocardial Perfusion Study-EF77%nrml, no isch; and 08' Stents-Cfx,Rca Cardiac Risk Factors: Carotid Disease, Hypertension, Lipids, PVD and Smoker  Symptoms:  Chest Pain and DOE   Nuclear Pre-Procedure Caffeine/Decaff Intake:  None NPO After: 7:00pm   Lungs:  CLEAR IV 0.9% NS with Angio Cath:  20g  IV Site: R Wrist  IV Started by:  Stanton Kidney, EMT-P  Chest Size (in):  38 Cup Size: C  Height: 5' 3.5" (1.613 m)  Weight:  154 lb (69.854 kg)  BMI:  Body mass index is 26.85 kg/(m^2). Tech Comments:  Carvedilol held    Nuclear Med Study 1 or 2 day study: 1 day  Stress Test Type:  Lexiscan  Reading MD: Olga Millers, MD  Order Authorizing Provider:  Ivor Messier, MD  Resting Radionuclide: Technetium 35m Tetrofosmin  Resting Radionuclide Dose: 11.0 mCi   Stress Radionuclide:  Technetium 39m Tetrofosmin  Stress Radionuclide Dose: 33.0 mCi           Stress Protocol Rest HR: 75 Stress HR: 88  Rest BP: 109/88 Stress BP: 109/56  Exercise Time (min): n/a METS: n/a   Predicted Max HR: 150 bpm % Max HR: 58.67 bpm Rate Pressure Product: 21308   Dose of Adenosine (mg):  n/a Dose of Lexiscan: 0.4 mg  Dose of Atropine (mg): n/a Dose of Dobutamine: n/a mcg/kg/min (at max HR)  Stress Test Technologist: Frederick Peers, EMT-P  Nuclear Technologist:  Domenic Polite, CNMT     Rest Procedure:  Myocardial perfusion imaging was performed at rest 45 minutes following the intravenous administration of Technetium 21m Tetrofosmin. Rest ECG: 1AVB  Stress Procedure:  The patient received IV Lexiscan 0.4 mg over 15-seconds.  Technetium 34m  Tetrofosmin injected at 30-seconds.  There were no significant changes with Lexiscan.Chest pressure subsided within minutes off recovery.  Quantitative spect images were obtained after a 45 minute delay. Stress ECG: No significant ST segment change suggestive of ischemia.  QPS Raw Data Images:  Acquisition technically good; normal left ventricular size. Stress Images:  There is decreased uptake in the apex. Rest Images:  There is decreased uptake in the apex. Subtraction (SDS):  No evidence of ischemia. Transient Ischemic Dilatation (Normal <1.22):  1.07 Lung/Heart Ratio (Normal <0.45):  0.26  Quantitative Gated Spect Images QGS EDV:  53 ml QGS ESV:  13 ml QGS cine images:  NL LV Function; NL Wall Motion QGS EF: 75%  Impression Exercise Capacity:  Lexiscan with no exercise. BP Response:  Hypotensive blood pressure response. Clinical Symptoms:  There is chest pain. ECG Impression:  No significant ST segment change suggestive of ischemia. Comparison with Prior Nuclear Study: No significant change from previous study  Overall Impression:  Normal stress nuclear study with a small fixed apical defect consistent with apical thinning but no ischemia.   Olga Millers

## 2011-07-03 ENCOUNTER — Encounter: Payer: Self-pay | Admitting: *Deleted

## 2011-09-03 ENCOUNTER — Other Ambulatory Visit: Payer: Self-pay | Admitting: Cardiology

## 2011-09-03 DIAGNOSIS — I6529 Occlusion and stenosis of unspecified carotid artery: Secondary | ICD-10-CM

## 2011-09-04 ENCOUNTER — Encounter (INDEPENDENT_AMBULATORY_CARE_PROVIDER_SITE_OTHER): Payer: Medicare Other | Admitting: *Deleted

## 2011-09-04 DIAGNOSIS — I6529 Occlusion and stenosis of unspecified carotid artery: Secondary | ICD-10-CM | POA: Diagnosis not present

## 2011-09-19 ENCOUNTER — Emergency Department (HOSPITAL_COMMUNITY): Payer: Medicare Other

## 2011-09-19 ENCOUNTER — Encounter (HOSPITAL_COMMUNITY): Payer: Self-pay | Admitting: Internal Medicine

## 2011-09-19 ENCOUNTER — Inpatient Hospital Stay (HOSPITAL_COMMUNITY)
Admission: EM | Admit: 2011-09-19 | Discharge: 2011-09-21 | DRG: 072 | Disposition: A | Discharge status: Home / Self Care | Payer: Medicare Other | Attending: Internal Medicine | Admitting: Internal Medicine

## 2011-09-19 ENCOUNTER — Other Ambulatory Visit: Payer: Self-pay

## 2011-09-19 DIAGNOSIS — J449 Chronic obstructive pulmonary disease, unspecified: Secondary | ICD-10-CM | POA: Diagnosis present

## 2011-09-19 DIAGNOSIS — Z7982 Long term (current) use of aspirin: Secondary | ICD-10-CM

## 2011-09-19 DIAGNOSIS — Z79899 Other long term (current) drug therapy: Secondary | ICD-10-CM

## 2011-09-19 DIAGNOSIS — J4489 Other specified chronic obstructive pulmonary disease: Secondary | ICD-10-CM | POA: Diagnosis present

## 2011-09-19 DIAGNOSIS — M25569 Pain in unspecified knee: Secondary | ICD-10-CM | POA: Diagnosis not present

## 2011-09-19 DIAGNOSIS — R0789 Other chest pain: Secondary | ICD-10-CM | POA: Diagnosis not present

## 2011-09-19 DIAGNOSIS — D7289 Other specified disorders of white blood cells: Secondary | ICD-10-CM | POA: Diagnosis not present

## 2011-09-19 DIAGNOSIS — I739 Peripheral vascular disease, unspecified: Secondary | ICD-10-CM | POA: Diagnosis present

## 2011-09-19 DIAGNOSIS — D72829 Elevated white blood cell count, unspecified: Secondary | ICD-10-CM | POA: Diagnosis present

## 2011-09-19 DIAGNOSIS — I2510 Atherosclerotic heart disease of native coronary artery without angina pectoris: Secondary | ICD-10-CM | POA: Diagnosis present

## 2011-09-19 DIAGNOSIS — F17200 Nicotine dependence, unspecified, uncomplicated: Secondary | ICD-10-CM | POA: Diagnosis not present

## 2011-09-19 DIAGNOSIS — M1710 Unilateral primary osteoarthritis, unspecified knee: Secondary | ICD-10-CM | POA: Diagnosis not present

## 2011-09-19 DIAGNOSIS — R197 Diarrhea, unspecified: Secondary | ICD-10-CM | POA: Diagnosis not present

## 2011-09-19 DIAGNOSIS — M25469 Effusion, unspecified knee: Secondary | ICD-10-CM | POA: Diagnosis present

## 2011-09-19 DIAGNOSIS — G9340 Encephalopathy, unspecified: Secondary | ICD-10-CM

## 2011-09-19 DIAGNOSIS — E785 Hyperlipidemia, unspecified: Secondary | ICD-10-CM | POA: Diagnosis present

## 2011-09-19 DIAGNOSIS — I10 Essential (primary) hypertension: Secondary | ICD-10-CM | POA: Diagnosis present

## 2011-09-19 DIAGNOSIS — I6789 Other cerebrovascular disease: Secondary | ICD-10-CM | POA: Diagnosis not present

## 2011-09-19 DIAGNOSIS — R443 Hallucinations, unspecified: Secondary | ICD-10-CM | POA: Diagnosis not present

## 2011-09-19 DIAGNOSIS — M25869 Other specified joint disorders, unspecified knee: Secondary | ICD-10-CM | POA: Diagnosis not present

## 2011-09-19 DIAGNOSIS — E876 Hypokalemia: Secondary | ICD-10-CM | POA: Diagnosis not present

## 2011-09-19 LAB — COMPREHENSIVE METABOLIC PANEL
AST: 28 U/L (ref 0–37)
Albumin: 3.5 g/dL (ref 3.5–5.2)
Alkaline Phosphatase: 121 U/L — ABNORMAL HIGH (ref 39–117)
Chloride: 96 mEq/L (ref 96–112)
Potassium: 3.9 mEq/L (ref 3.5–5.1)
Sodium: 136 mEq/L (ref 135–145)
Total Bilirubin: 0.5 mg/dL (ref 0.3–1.2)
Total Protein: 8.4 g/dL — ABNORMAL HIGH (ref 6.0–8.3)

## 2011-09-19 LAB — CBC
Hemoglobin: 14.6 g/dL (ref 12.0–15.0)
MCH: 32.8 pg (ref 26.0–34.0)
MCHC: 35.4 g/dL (ref 30.0–36.0)
Platelets: 322 10*3/uL (ref 150–400)
RDW: 12.6 % (ref 11.5–15.5)

## 2011-09-19 LAB — URINALYSIS, ROUTINE W REFLEX MICROSCOPIC
Ketones, ur: 15 mg/dL — AB
Leukocytes, UA: NEGATIVE
Nitrite: NEGATIVE
Protein, ur: 100 mg/dL — AB
Urobilinogen, UA: 0.2 mg/dL (ref 0.0–1.0)
pH: 5.5 (ref 5.0–8.0)

## 2011-09-19 LAB — DIFFERENTIAL
Basophils Absolute: 0 10*3/uL (ref 0.0–0.1)
Basophils Relative: 0 % (ref 0–1)
Eosinophils Absolute: 0 10*3/uL (ref 0.0–0.7)
Neutro Abs: 14 10*3/uL — ABNORMAL HIGH (ref 1.7–7.7)
Neutrophils Relative %: 84 % — ABNORMAL HIGH (ref 43–77)

## 2011-09-19 LAB — URINE MICROSCOPIC-ADD ON

## 2011-09-19 MED ORDER — SODIUM CHLORIDE 0.9 % IV BOLUS (SEPSIS)
1000.0000 mL | Freq: Once | INTRAVENOUS | Status: AC
  Administered 2011-09-19: 1000 mL via INTRAVENOUS

## 2011-09-19 MED ORDER — ONDANSETRON HCL 4 MG/2ML IJ SOLN: 4.0000 mg | Freq: Three times a day (TID) | INTRAMUSCULAR | Status: DC | PRN

## 2011-09-19 MED ORDER — PIPERACILLIN SOD-TAZOBACTAM SO 2.25 (2-0.25) G IV SOLR
3.3750 g | Freq: Once | INTRAVENOUS | Status: AC
  Administered 2011-09-19: 3.375 g via INTRAVENOUS
  Filled 2011-09-19: qty 3.38

## 2011-09-19 MED ORDER — SODIUM CHLORIDE 0.9 % IV SOLN: INTRAVENOUS | Status: DC

## 2011-09-19 MED ORDER — VANCOMYCIN HCL IN DEXTROSE 1-5 GM/200ML-% IV SOLN
1000.0000 mg | Freq: Once | INTRAVENOUS | Status: AC
  Administered 2011-09-19: 1000 mg via INTRAVENOUS
  Filled 2011-09-19: qty 200

## 2011-09-19 NOTE — ED Notes (Signed)
Patient transported to X-ray 

## 2011-09-19 NOTE — ED Notes (Signed)
pts son states that pt is normal alert x4, they came over this am and she is confused and not acting her normal, said that she was fine yesterday, bp elevated, cbg 166,

## 2011-09-19 NOTE — ED Notes (Signed)
MD at bedside. Dr. Sunnie Nielsen at bedside.

## 2011-09-19 NOTE — ED Provider Notes (Signed)
History     CSN: [id number]  Arrival date & time 09/19/11  1617   First MD Initiated Contact with Patient 09/19/11 1624      Chief Complaint  Patient presents with  . Altered Mental Status  . Knee Pain    HPI This patient with multiple medical problems, including chronic pain now presents with altered mental status.  On arrival the patient denies any complaints, notes that she typically has lower back and bilateral knee pain.  These aches have increased over the past few days, but she denies fevers, chills, lightheadedness, syncope, dysuria, dyspnea, chest pain. The patient's family arrived sometime after the patient's initial presentation.  He notes that during the course of the day, including the prior 10 hours the patient has been acting idiosyncratically, describing visual hallucinations, delusional thoughts, and performing repetitive behavior.  They do not describe any noted specific onset of the symptoms, nor any specific neurologic deficiencies.  When asked about these observations, the patient notes that she does not understand. Past Medical History  Diagnosis Date  . CAD (coronary artery disease)   . PVD (peripheral vascular disease)   . HTN (hypertension)   . Hyperlipidemia   . Cerebrovascular disease   . COPD (chronic obstructive pulmonary disease)     Past Surgical History  Procedure Date  . Knee surgery     Family History  Problem Relation Age of Onset  . Breast cancer Mother   . Leukemia Father   . Stroke Sister   . Bone cancer      History  Substance Use Topics  . Smoking status: Current Everyday Smoker  . Smokeless tobacco: Not on file  . Alcohol Use: No    OB History    Grav Para Term Preterm Abortions TAB SAB Ect Mult Living                  Review of Systems  Unable to perform ROS: Mental status change    Allergies  Review of patient's allergies indicates no known allergies.  Home Medications   Current Outpatient Rx  Name Route Sig  Dispense Refill  . AMLODIPINE BESYLATE 10 MG PO TABS Oral Take 1 tablet (10 mg total) by mouth daily. 90 tablet 3  . ASPIRIN 81 MG PO TABS Oral Take 81 mg by mouth daily.      . ATORVASTATIN CALCIUM 40 MG PO TABS  TAKE ONE TABLET BY MOUTH DAILY. 90 tablet 2  . CARVEDILOL 6.25 MG PO TABS  TAKE 1 TABLET BY MOUTH TWO TIMES A DAY 60 tablet 12  . CLONIDINE HCL 0.2 MG PO TABS Oral Take 0.2 mg by mouth 2 (two) times daily.      [name] HYDROCHLOROTHIAZIDE 12.5 MG PO CAPS Oral Take 1 capsule (12.5 mg total) by mouth every morning. 90 capsule 3  . LISINOPRIL 40 MG PO TABS Oral Take 1 tablet (40 mg total) by mouth daily. 90 tablet 3  . ADULT MULTIVITAMIN W/MINERALS CH Oral Take 1 tablet by mouth daily.      BP 159/99  Pulse 118  Temp(Src) 100.4 F (38 C) (Rectal)  Resp 18  SpO2 99%  Physical Exam  Nursing note and vitals reviewed. Constitutional: She is oriented to person, place, and time. She appears well-developed and well-nourished. No distress.  HENT:  Head: Normocephalic and atraumatic.  Eyes: Conjunctivae and EOM are normal.  Cardiovascular: Normal rate and regular rhythm.   Pulmonary/Chest: Effort normal and breath sounds normal. No stridor. No respiratory  distress.  Abdominal: She exhibits no distension.  Musculoskeletal: She exhibits no edema and no tenderness.       Both knees are non-erythematous, non-edematous, tender to palpation.  The patient notes restricted range of motion more with the left knee, secondary to pain.  Neurological: She is alert and oriented to person, place, and time. No cranial nerve deficit. She exhibits normal muscle tone. Coordination normal.  Skin: Skin is warm and dry.  Psychiatric: She has a normal mood and affect.    ED Course  Procedures (including critical care time)  Labs Reviewed  URINALYSIS, ROUTINE W REFLEX MICROSCOPIC - Abnormal; Notable for the following:    Color, Urine AMBER (*) BIOCHEMICALS MAY BE AFFECTED BY COLOR   APPearance CLOUDY (*)     Bilirubin Urine SMALL (*)    Ketones, ur 15 (*)    Protein, ur 100 (*)    All other components within normal limits  CBC - Abnormal; Notable for the following:    WBC 16.7 (*)    All other components within normal limits  DIFFERENTIAL - Abnormal; Notable for the following:    Neutrophils Relative 84 (*)    Neutro Abs 14.0 (*)    Lymphocytes Relative 6 (*)    Monocytes Absolute 1.7 (*)    All other components within normal limits  COMPREHENSIVE METABOLIC PANEL - Abnormal; Notable for the following:    Glucose, Bld 161 (*)    BUN 28 (*)    Calcium 10.9 (*)    Total Protein 8.4 (*)    Alkaline Phosphatase 121 (*)    GFR calc non Af Amer 71 (*)    GFR calc Af Amer 82 (*)    All other components within normal limits  URINE MICROSCOPIC-ADD ON - Abnormal; Notable for the following:    Squamous Epithelial / LPF FEW (*)    Casts HYALINE CASTS (*)    All other components within normal limits  TROPONIN I   Dg Chest 2 View  09/19/2011  *RADIOLOGY REPORT*  Clinical Data: Chest discomfort.History of coronary stents.  Smoker with controlled hypertension.  Tobacco abuse.  CHEST - 2 VIEW  Comparison: 03/07/2007  Findings: Hyperinflation. Lateral view degraded by patient arm position.  Midline trachea.  Normal heart size and mediastinal contours for age.  Tortuous descending thoracic aorta. No pleural effusion or pneumothorax.  Clear lungs.  IMPRESSION: Hyperinflation, without acute disease.  Original Report Authenticated By: Consuello Bossier, M.D.   Ct Head Wo Contrast  09/19/2011  *RADIOLOGY REPORT*  Clinical Data: Altered mental status.  CT HEAD WITHOUT CONTRAST  Technique:  Contiguous axial images were obtained from the base of the skull through the vertex without contrast.  Comparison: None.  Findings: The brain does not show generalized atrophy.  No focal abnormality is noted affecting the brainstem or cerebellum.  Within the cerebral hemispheres, there is a 1 cm low density within the corona  radiata.  This is consistent with an infarction, though the age is indeterminate.  It could be recent.  No large vessel territory or cortical insult.  No mass lesion, hemorrhage, hydrocephalus or extra-axial collection.  The calvarium is unremarkable.  Sinuses, middle ears and mastoids are clear.  IMPRESSION: 1 cm focal low density in the corona radiata region on the left. This is consistent with an infarction, though the age is difficult to establish with certainty.  This could be a recent/acute infarction.  There is no hemorrhage or mass effect.  Original Report Authenticated By:  Thomasenia Sales, M.D.     No diagnosis found.  Xr, ct, both reviewed by me  Cardiac: 121- stach, abnormal  Pulse ox 100% ra- normal   Date: 09/19/2011  Rate: 117  Rhythm: sinus tachycardia  QRS Axis: normal  Intervals: normal  ST/T Wave abnormalities: nonspecific T wave changes  Conduction Disutrbances:none  Narrative Interpretation:   Old EKG Reviewed: changes noted  ABNORMAL ECG  MDM  The patient presents following reports of altered mental status.  The patient is an inconsistent historian, but is oriented.  Denies significant new complaints.  Notably, the patient is moderately febrile, tachycardic, with leukocytosis.  She has no overt source of infection.  The patient's initial CT demonstrated possible infarct.  Followup MRI did not demonstrate acute findings.  Given this absence of acute neurologic phenomena, the patient's symptoms are likely due to bacteremia, though this is an atypical presentation.  The patient has no rash, no neck pain, no suggestion of meningitis.  Given the concern for her constellation of symptoms she was treated empirically with vancomycin and Zosyn.  She was admitted for further evaluation and management.        Gerhard Munch, MD 09/20/11 [phone]

## 2011-09-19 NOTE — ED Notes (Signed)
Pt presenting to ed with c/o altered mental status per family at triage. Pt is alert and oriented at this time. Pt is in nad. Pt is answering questions appropriately at this time. Pt with swelling noted to left knee.

## 2011-09-19 NOTE — H&P (Signed)
Hospital Admission Note Date: 09/19/2011  PCP: No primary provider on file.  Chief Complaint: AMS.  History of Present Illness: 71 year old with past medical history of hypertension, hyperlipidemia, was brought to the emergency department by family member due to  alter mental status. Per ED records because there is no family at bedside, patient was notice  to be confused, not acting her normal, having hallucination. When I saw the patient, she  was awake and alert.  She was able to tell me that she is at [place] long, the year,  Her age. When I ask her why your in the hospital , she told me: My son thought that there was something wrong with me. She denies headache, neck pain, cough, dysuria. She is complaing of left knee pain and swelling that started 4 days prior to admission. She had some injection in her knee weeks ago.    Allergies: Review of patient's allergies indicates no known allergies. Past Medical History  Diagnosis Date  . CAD (coronary artery disease)   . PVD (peripheral vascular disease)   . HTN (hypertension)   . Hyperlipidemia   . Cerebrovascular disease   . COPD (chronic obstructive pulmonary disease)    Prior to Admission medications   Medication Sig Start Date End Date Taking? Authorizing Provider  amLODipine (NORVASC) 10 MG tablet Take 1 tablet (10 mg total) by mouth daily. 06/04/11 06/03/12 Yes Lewayne Bunting, MD  aspirin 81 MG tablet Take 81 mg by mouth daily.     Yes Historical Provider, MD  atorvastatin (LIPITOR) 40 MG tablet TAKE ONE TABLET BY MOUTH DAILY. 04/17/11  Yes Lewayne Bunting, MD  carvedilol (COREG) 6.25 MG tablet TAKE 1 TABLET BY MOUTH TWO TIMES A DAY 01/26/11  Yes Lewayne Bunting, MD  cloNIDine (CATAPRES) 0.2 MG tablet Take 0.2 mg by mouth 2 (two) times daily.     Yes Historical Provider, MD  hydrochlorothiazide (MICROZIDE) 12.5 MG capsule Take 1 capsule (12.5 mg total) by mouth every morning. 06/04/11  Yes Lewayne Bunting, MD  lisinopril  (PRINIVIL,ZESTRIL) 40 MG tablet Take 1 tablet (40 mg total) by mouth daily. 06/04/11  Yes Lewayne Bunting, MD  Multiple Vitamin (MULITIVITAMIN WITH MINERALS) TABS Take 1 tablet by mouth daily.   Yes Historical Provider, MD   Past Surgical History  Procedure Date  . Knee surgery    Family History  Problem Relation Age of Onset  . Breast cancer Mother   . Leukemia Father   . Stroke Sister   . Bone cancer     History   Social History  . Marital Status: Single    Spouse Name: N/A    Number of Children: N/A  . Years of Education: N/A   Occupational History  . Not on file.   Social History Main Topics  . Smoking status: Current Everyday Smoker, half pack per day.   . Smokeless tobacco: Not on file  . Alcohol Use: No  . Drug Use: No          REVIEW OF SYSTEMS:  Constitutional:  No weight loss, night sweats, Fevers, chills, fatigue.  HEENT:  No headaches, Difficulty swallowing,Tooth/dental problems,Sore throat,  No sneezing, itching, ear ache, nasal congestion, post nasal drip,  Cardio-vascular:  No chest pain, Orthopnea, PND, swelling in lower extremities, anasarca, dizziness, palpitations  GI:  No heartburn, indigestion, abdominal pain, nausea, vomiting, diarrhea, change in bowel habits, loss of appetite  Resp:  No shortness of breath with exertion or at rest.  No excess mucus, no productive cough, No non-productive cough, No coughing up of blood.No change in color of mucus.No wheezing.No chest wall deformity  Skin:  no rash or lesions.  GU:  no dysuria, change in color of urine, no urgency or frequency. No flank pain.     Physical Exam: Filed Vitals:   09/19/11 1621 09/19/11 1708 09/19/11 1945 09/19/11 2116  BP: 157/74 159/99 183/75 142/73  Pulse: 115 118 124 117  Temp: 99.4 F (37.4 C) 100.4 F (38 C) 98.7 F (37.1 C)   TempSrc: Oral Rectal Oral   Resp: 15 18 18 16   SpO2: 95% 99% 99% 96%   No intake or output data in the 24 hours ending 09/19/11 2333 BP  142/73  Pulse 117  Temp(Src) 98.7 F (37.1 C) (Oral)  Resp 16  SpO2 96%  General Appearance:    Alert, cooperative, no distress, appears stated age  Head:    Normocephalic, without obvious abnormality, atraumatic  Eyes:    PERRL, conjunctiva/corneas clear, EOM's intact,   Ears:    Normal TM's and external ear canals, both ears  Nose:   Nares normal, septum midline, mucosa normal, no drainage    or sinus tenderness  Throat:   Lips, mucosa, and tongue normal; teeth and gums normal  Neck:   Supple, symmetrical, trachea midline, no adenopathy;    thyroid:  no enlargement/tenderness/nodules; no carotid   bruit or JVD     Lungs:     Clear to auscultation bilaterally, respirations unlabored      Heart:    Regular rate and rhythm, S1 and S2 normal, no murmur, rub   or gallop     Abdomen:     Soft, non-tender, bowel sounds active all four quadrants,    no masses, no organomegaly        Extremities:   Extremities normal, atraumatic, no cyanosis, left knee with significant swelling, no redness.   Pulses:   2+ and symmetric all extremities  Skin:   Skin color, texture, turgor normal, no rashes or lesions     Neurologic:   CNII-XII intact, normal strength, sensation and reflexes    throughout   Lab results:  [facility] 09/19/11 1651  NA 136  K 3.9  CL 96  CO2 26  GLUCOSE 161*  BUN 28*  CREATININE 0.82  CALCIUM 10.9*  MG --  PHOS --    Basename 09/19/11 1651  AST 28  ALT 20  ALKPHOS 121*  BILITOT 0.5  PROT 8.4*  ALBUMIN 3.5    Basename 09/19/11 1651  WBC 16.7*  NEUTROABS 14.0*  HGB 14.6  HCT 41.3  MCV 92.8  PLT 322    Basename 09/19/11 1651  CKTOTAL --  CKMB --  CKMBINDEX --  TROPONINI <0.30   Imaging results:  Dg Chest 2 View  09/19/2011  *RADIOLOGY REPORT*  Clinical Data: Chest discomfort.History of coronary stents.  Smoker with controlled hypertension.  Tobacco abuse.  CHEST - 2 VIEW  Comparison: 03/07/2007  Findings: Hyperinflation. Lateral view degraded  by patient arm position.  Midline trachea.  Normal heart size and mediastinal contours for age.  Tortuous descending thoracic aorta. No pleural effusion or pneumothorax.  Clear lungs.  IMPRESSION: Hyperinflation, without acute disease.  Original Report Authenticated By: Consuello Bossier, M.D.   Ct Head Wo Contrast  09/19/2011  *RADIOLOGY REPORT*  Clinical Data: Altered mental status.  CT HEAD WITHOUT CONTRAST  Technique:  Contiguous axial images were obtained from the base of the skull  through the vertex without contrast.  Comparison: None.  Findings: The brain does not show generalized atrophy.  No focal abnormality is noted affecting the brainstem or cerebellum.  Within the cerebral hemispheres, there is a 1 cm low density within the corona radiata.  This is consistent with an infarction, though the age is indeterminate.  It could be recent.  No large vessel territory or cortical insult.  No mass lesion, hemorrhage, hydrocephalus or extra-axial collection.  The calvarium is unremarkable.  Sinuses, middle ears and mastoids are clear.  IMPRESSION: 1 cm focal low density in the corona radiata region on the left. This is consistent with an infarction, though the age is difficult to establish with certainty.  This could be a recent/acute infarction.  There is no hemorrhage or mass effect.  Original Report Authenticated By: Thomasenia Sales, M.D.   Mr Brain Wo Contrast  09/19/2011  *RADIOLOGY REPORT*  Clinical Data: Altered mental status.  Abnormal head CT.  MRI HEAD WITHOUT CONTRAST  Technique:  Multiplanar, multiecho pulse sequences of the brain and surrounding structures were obtained according to standard protocol without intravenous contrast.  Comparison: Head CT same day  Findings: Diffusion imaging does not show any acute or subacute infarction.  The brainstem and cerebellum are normal.  The cerebral hemispheres show mild chronic small vessel disease within the hemispheric white matter.  There is an old  infarction in the left basal ganglia/corona radiata out region that explains the low density seen on CT.  This is not recent.  No large vessel territory insult.  No mass lesion, hemorrhage, hydrocephalus or extra-axial collection.  No pituitary mass.  There is arachnoid herniation into the sella.  No inflammatory sinus disease.  IMPRESSION: The low density questioned at CT represents an old infarction in the left basal ganglia/corona radiata.  There is mild chronic small vessel disease elsewhere within the brain.  No evidence of acute or reversible process.  Original Report Authenticated By: Thomasenia Sales, M.D.     Patient Active Hospital Problem List:  Left Knee swelling:  No redness on physical exam, she does have significant swelling. I will order arthoracentesis, will send fluid for culture, cell, gram stain, crystal. I will check Knee xray. We can consider consult  ortho Dr Lajoyce Corners.   Leukocytosis (09/19/2011) I will continue with antibiotics. Left knee arthrocentesis. Blood culture, Urine culture. Chest x ray negative.   Encephalopathy (09/19/2011) Apparently improved. Patient alert and oriented. Maybe secondary to fever, infection. CT head showed infarct. MRI confirm it was old infarct. Patient denies headache, neck pain unlikely meningitis.   HYPERTENSION, UNSPECIFIED (01/14/2009) I will hold BP medications. i will start coreg.   CHRONIC OBSTRUCTIVE PULMONARY DISEASE (01/14/2009) Stable.   Patient will need a PCP.       Dartanian Knaggs M.D. [facility] [phone] 09/19/2011, 11:33 PM

## 2011-09-19 NOTE — ED Notes (Signed)
Pt returned from MRI scan. Family at bedside.

## 2011-09-19 NOTE — ED Notes (Signed)
Lower back pain intermit

## 2011-09-19 NOTE — ED Notes (Signed)
Rainbow drawn, 1 lavender, 1 blue, 1 light green, 1 dark green, 1 set of blood cultures drawn. Blood sent to lab by [facility] phelbotomist

## 2011-09-20 ENCOUNTER — Encounter (HOSPITAL_COMMUNITY): Payer: Self-pay

## 2011-09-20 DIAGNOSIS — M1710 Unilateral primary osteoarthritis, unspecified knee: Secondary | ICD-10-CM | POA: Diagnosis not present

## 2011-09-20 DIAGNOSIS — I10 Essential (primary) hypertension: Secondary | ICD-10-CM | POA: Diagnosis present

## 2011-09-20 DIAGNOSIS — E785 Hyperlipidemia, unspecified: Secondary | ICD-10-CM | POA: Diagnosis present

## 2011-09-20 DIAGNOSIS — R4182 Altered mental status, unspecified: Secondary | ICD-10-CM | POA: Diagnosis not present

## 2011-09-20 DIAGNOSIS — I739 Peripheral vascular disease, unspecified: Secondary | ICD-10-CM | POA: Diagnosis present

## 2011-09-20 DIAGNOSIS — Z79899 Other long term (current) drug therapy: Secondary | ICD-10-CM | POA: Diagnosis not present

## 2011-09-20 DIAGNOSIS — D72829 Elevated white blood cell count, unspecified: Secondary | ICD-10-CM | POA: Diagnosis present

## 2011-09-20 DIAGNOSIS — R509 Fever, unspecified: Secondary | ICD-10-CM | POA: Diagnosis not present

## 2011-09-20 DIAGNOSIS — Z7982 Long term (current) use of aspirin: Secondary | ICD-10-CM | POA: Diagnosis not present

## 2011-09-20 DIAGNOSIS — J449 Chronic obstructive pulmonary disease, unspecified: Secondary | ICD-10-CM | POA: Diagnosis present

## 2011-09-20 DIAGNOSIS — I2510 Atherosclerotic heart disease of native coronary artery without angina pectoris: Secondary | ICD-10-CM | POA: Diagnosis present

## 2011-09-20 DIAGNOSIS — R197 Diarrhea, unspecified: Secondary | ICD-10-CM | POA: Diagnosis not present

## 2011-09-20 DIAGNOSIS — M25469 Effusion, unspecified knee: Secondary | ICD-10-CM | POA: Diagnosis not present

## 2011-09-20 DIAGNOSIS — G9340 Encephalopathy, unspecified: Secondary | ICD-10-CM | POA: Diagnosis present

## 2011-09-20 DIAGNOSIS — E876 Hypokalemia: Secondary | ICD-10-CM | POA: Diagnosis not present

## 2011-09-20 LAB — CBC
HCT: 35.5 % — ABNORMAL LOW (ref 36.0–46.0)
Hemoglobin: 12.5 g/dL (ref 12.0–15.0)
RBC: 3.76 MIL/uL — ABNORMAL LOW (ref 3.87–5.11)
WBC: 13.8 10*3/uL — ABNORMAL HIGH (ref 4.0–10.5)

## 2011-09-20 LAB — COMPREHENSIVE METABOLIC PANEL
BUN: 14 mg/dL (ref 6–23)
Calcium: 8.8 mg/dL (ref 8.4–10.5)
GFR calc Af Amer: >90 mL/min (ref >90)
Glucose, Bld: 124 mg/dL — ABNORMAL HIGH (ref 70–99)
Sodium: 137 mEq/L (ref 135–145)
Total Protein: 6.7 g/dL (ref 6.0–8.3)

## 2011-09-20 MED ORDER — PIPERACILLIN-TAZOBACTAM 3.375 G IVPB
3.3750 g | Freq: Three times a day (TID) | INTRAVENOUS | Status: DC
  Administered 2011-09-20: 3.375 g via INTRAVENOUS
  Filled 2011-09-20 (×4): qty 50

## 2011-09-20 MED ORDER — ACETAMINOPHEN 650 MG RE SUPP: 650.0000 mg | Freq: Four times a day (QID) | RECTAL | Status: DC | PRN

## 2011-09-20 MED ORDER — ACETAMINOPHEN 325 MG PO TABS
650.0000 mg | ORAL_TABLET | Freq: Four times a day (QID) | ORAL | Status: DC | PRN
  Administered 2011-09-20 – 2011-09-21 (×4): 650 mg via ORAL
  Filled 2011-09-20 (×4): qty 2

## 2011-09-20 MED ORDER — ROSUVASTATIN CALCIUM 40 MG PO TABS
40.0000 mg | ORAL_TABLET | Freq: Every day | ORAL | Status: DC
  Administered 2011-09-20 – 2011-09-21 (×2): 40 mg via ORAL
  Filled 2011-09-20 (×2): qty 1

## 2011-09-20 MED ORDER — DOCUSATE SODIUM 100 MG PO CAPS
100.0000 mg | ORAL_CAPSULE | Freq: Two times a day (BID) | ORAL | Status: DC
  Administered 2011-09-20 – 2011-09-21 (×4): 100 mg via ORAL
  Filled 2011-09-20 (×5): qty 1

## 2011-09-20 MED ORDER — CARVEDILOL 6.25 MG PO TABS
6.2500 mg | ORAL_TABLET | Freq: Two times a day (BID) | ORAL | Status: DC
  Administered 2011-09-20 – 2011-09-21 (×2): 6.25 mg via ORAL
  Filled 2011-09-20 (×5): qty 1

## 2011-09-20 MED ORDER — VANCOMYCIN HCL 1000 MG IV SOLR
750.0000 mg | Freq: Two times a day (BID) | INTRAVENOUS | Status: DC
  Administered 2011-09-20: 750 mg via INTRAVENOUS
  Filled 2011-09-20 (×3): qty 750

## 2011-09-20 MED ORDER — HYDROCODONE-ACETAMINOPHEN 5-325 MG PO TABS: 1.0000 | ORAL_TABLET | ORAL | Status: DC | PRN

## 2011-09-20 MED ORDER — SODIUM CHLORIDE 0.9 % IV SOLN
INTRAVENOUS | Status: DC
  Administered 2011-09-20: 05:00:00 via INTRAVENOUS

## 2011-09-20 NOTE — ED Notes (Signed)
Irene Limbo, MD returned page and sts he will be down to see pt and will then consult Ortho as needed.

## 2011-09-20 NOTE — ED Notes (Signed)
2 attempts to call report to [address] Nurse has been busy

## 2011-09-20 NOTE — ED Notes (Signed)
Report called to [employer] on [address].

## 2011-09-20 NOTE — ED Notes (Signed)
Radiology called and stated they did not need to perform knee aspiration. MD or Ortho could perform at bedside. Paged Team 2, informed pt would be seen by Team 6, Irene Limbo, today. Page resent, awaiting return call for any further orders.

## 2011-09-20 NOTE — Progress Notes (Signed)
PROGRESS NOTE  [name]:[id number] DOB: 27-Feb-1941 DOA: 09/19/2011 PCP: None. Orthopedic surgeon: Aldean Baker, M.D. Cardiologist: Olga Millers, M.D.   Brief narrative: 71 year old woman brought to the emergency department for confusion by family. Per family the patient had displayed visual hallucinations, delusional thoughts and repetitive behavior. ER documentation is simple for the patient being alert and oriented on presentation. History is notable for chronic low back and bilateral knee pain.  Was noted to be oriented in by emergency department physician however because of fever, tachycardia leukocytosis patient was referred for admission.  Past medical history: Coronary artery disease, peripheral vascular disease, hypertension, hyperlipidemia, COPD, cerebrovascular disease  Consultants:  None  Procedures:  None  Antibiotics:  February 20: Zosyn  February 20: Vancomycin  Interim History: Chart reviewed in detail.  Subjective: No complaints. She feels mentally clear. Son at bedside also feel she is mentally clear. Main complaint is left knee pain. This pain has been chronic in nature and worsening over time. She had injection of her knee with an artificial lubricant approximately 6 months ago.  Objective: Filed Vitals:   09/20/11 0630 09/20/11 0814 09/20/11 0949 09/20/11 1125  BP: 150/69 141/88  152/69  Pulse:  114  95  Temp:  98.9 F (37.2 C) 98 F (36.7 C) 98.7 F (37.1 C)  TempSrc:  Oral Oral Oral  Resp:  18  24  Height:      Weight:      SpO2:  94%  93%   No intake or output data in the 24 hours ending 09/20/11 1225  Exam:   General:  Appears calm and comfortable.  Cardiovascular: Regular rate and rhythm. No murmur, rub, gallop.   Respiratory: Clear to auscultation bilaterally. No wheezes, rales, rhonchi. Normal respiratory effort.  Musculoskeletal: Remarkable effusion of the left knee. Nontender. No erythema. No fluctuance. Range of  movement limited by the size of the effusion but has no pain with movement. Appears to be generally weak bilaterally and symmetric in nature.  Psychiatric: Alert and oriented to self, month, year, location  Neurologic: Cranial nerves 2-12 appear intact. Speech fluent and clear.  Data Reviewed: Basic Metabolic Panel:  Lab 09/20/11 8119 09/19/11 1651  NA 137 136  K 2.8* 3.9  CL 101 96  CO2 25 26  GLUCOSE 124* 161*  BUN 14 28*  CREATININE 0.60 0.82  CALCIUM 8.8 10.9*  MG -- --  PHOS -- --   Liver Function Tests:  Lab 09/20/11 0433 09/19/11 1651  AST 25 28  ALT 19 20  ALKPHOS 100 121*  BILITOT 0.5 0.5  PROT 6.7 8.4*  ALBUMIN 2.7* 3.5   CBC:  Lab 09/20/11 0433 09/19/11 1651  WBC 13.8* 16.7*  NEUTROABS -- 14.0*  HGB 12.5 14.6  HCT 35.5* 41.3  MCV 94.4 92.8  PLT 283 322   Cardiac Enzymes:  Lab 09/19/11 1651  CKTOTAL --  CKMB --  CKMBINDEX --  TROPONINI <0.30   Studies: Dg Chest 2 View  09/19/2011  *RADIOLOGY REPORT*  Clinical Data: Chest discomfort.History of coronary stents.  Smoker with controlled hypertension.  Tobacco abuse.  CHEST - 2 VIEW  Comparison: 03/07/2007  Findings: Hyperinflation. Lateral view degraded by patient arm position.  Midline trachea.  Normal heart size and mediastinal contours for age.  Tortuous descending thoracic aorta. No pleural effusion or pneumothorax.  Clear lungs.  IMPRESSION: Hyperinflation, without acute disease.  Original Report Authenticated By: Consuello Bossier, M.D.   Ct Head Wo Contrast  09/19/2011  *RADIOLOGY  REPORT*  Clinical Data: Altered mental status.  CT HEAD WITHOUT CONTRAST  Technique:  Contiguous axial images were obtained from the base of the skull through the vertex without contrast.  Comparison: None.  Findings: The brain does not show generalized atrophy.  No focal abnormality is noted affecting the brainstem or cerebellum.  Within the cerebral hemispheres, there is a 1 cm low density within the corona radiata.  This is  consistent with an infarction, though the age is indeterminate.  It could be recent.  No large vessel territory or cortical insult.  No mass lesion, hemorrhage, hydrocephalus or extra-axial collection.  The calvarium is unremarkable.  Sinuses, middle ears and mastoids are clear.  IMPRESSION: 1 cm focal low density in the corona radiata region on the left. This is consistent with an infarction, though the age is difficult to establish with certainty.  This could be a recent/acute infarction.  There is no hemorrhage or mass effect.  Original Report Authenticated By: Thomasenia Sales, M.D.   Mr Brain Wo Contrast  09/19/2011  *RADIOLOGY REPORT*  Clinical Data: Altered mental status.  Abnormal head CT.  MRI HEAD WITHOUT CONTRAST  Technique:  Multiplanar, multiecho pulse sequences of the brain and surrounding structures were obtained according to standard protocol without intravenous contrast.  Comparison: Head CT same day  Findings: Diffusion imaging does not show any acute or subacute infarction.  The brainstem and cerebellum are normal.  The cerebral hemispheres show mild chronic small vessel disease within the hemispheric white matter.  There is an old infarction in the left basal ganglia/corona radiata out region that explains the low density seen on CT.  This is not recent.  No large vessel territory insult.  No mass lesion, hemorrhage, hydrocephalus or extra-axial collection.  No pituitary mass.  There is arachnoid herniation into the sella.  No inflammatory sinus disease.  IMPRESSION: The low density questioned at CT represents an old infarction in the left basal ganglia/corona radiata.  There is mild chronic small vessel disease elsewhere within the brain.  No evidence of acute or reversible process.  Original Report Authenticated By: Thomasenia Sales, M.D.   Dg Knee Complete 4 Views Left  09/19/2011  *RADIOLOGY REPORT*  Clinical Data: Left knee pain.  No known injury.  LEFT KNEE - COMPLETE 4+ VIEW  Comparison:  None.  Findings: Diffuse osteopenia.  Tricompartmental joint space narrowing and spur formation.  Moderate to large sized effusion. 3.4 cm calcified loose body posteriorly.  No fracture or dislocation seen.  Arterial calcifications.  IMPRESSION:  1.  Tricompartmental degenerative changes. 2.  Moderate to large sized effusion. 3.  Large posterior calcified loose body.  Original Report Authenticated By: Darrol Angel, M.D.    Scheduled Meds:   . carvedilol  6.25 mg Oral BID WC  . docusate sodium  100 mg Oral BID  . piperacillin-tazobactam (ZOSYN)  IV  3.375 g Intravenous Once  . piperacillin-tazobactam (ZOSYN)  IV  3.375 g Intravenous Q8H  . rosuvastatin  40 mg Oral Daily  . sodium chloride  1,000 mL Intravenous Once  . sodium chloride  1,000 mL Intravenous Once  . vancomycin  750 mg Intravenous Q12H  . vancomycin  1,000 mg Intravenous Once  . DISCONTD: sodium chloride   Intravenous STAT   Continuous Infusions:   . sodium chloride 50 mL/hr at 09/20/11 0436     Assessment/Plan: 1. Altered mental status/encephalopathy: Resolved on presentation to the emergency department. Noted to have some fever which has now resolved.  MRI brain negative for acute abnormalities. 2. Fever: Now resolved. Leukocytosis improved. Urinalysis negative. Blood cultures pending. No source identified. Etiology unclear but favor viral in nature. Discontinue IV antibiotics given lack of source and no localizing symptoms. 3. Left knee swelling: History is entirely consistent with a progressive gradual process. She has had injections performed in the past and has severe arthritis of the knee. See no evidence to suggest septic joint or infection. Discontinue antibiotics. I discussed the case with her orthopedic surgeon Dr. Lajoyce Corners who feels she can be discharged tomorrow with followup in the office in day. 4. Hypokalemia: Replete. Order diet.  Physical therapy evaluation. If remains afebrile and has no further issues would  anticipate discharge home tomorrow morning.  Code Status:  Family Communication: Discussed with son at bedside the above. Disposition Plan: Physical therapy evaluation. Hopefully discharge home tomorrow.   Brendia Sacks, MD  [facility] Pager [phone] 09/20/2011, 12:25 PM    LOS: 1 day

## 2011-09-20 NOTE — Progress Notes (Signed)
ANTIBIOTIC CONSULT NOTE - INITIAL  Pharmacy Consult for Vancomycin and Zosyn  Indication: Leukocytosis   No Known Allergies  Patient Measurements: Height: 5' 3.39" (161 cm) Weight: 160 lb (72.576 kg) IBW/kg (Calculated) : 53.29  Adjusted Body Weight:  Vital Signs: Temp: 98.7 F (37.1 C) (02/20 1945) Temp src: Oral (02/20 1945) BP: 142/73 mmHg (02/20 2116) Pulse Rate: 117  (02/20 2116) Intake/Output from previous day:   Intake/Output from this shift:    Labs:  Basename 09/19/11 1651  WBC 16.7*  HGB 14.6  PLT 322  LABCREA --  CREATININE 0.82   Estimated Creatinine Clearance: 61.5 ml/min (by C-G formula based on Cr of 0.82). No results found for this basename: VANCOTROUGH:2,VANCOPEAK:2,VANCORANDOM:2,GENTTROUGH:2,GENTPEAK:2,GENTRANDOM:2,TOBRATROUGH:2,TOBRAPEAK:2,TOBRARND:2,AMIKACINPEAK:2,AMIKACINTROU:2,AMIKACIN:2, in the last 72 hours   Microbiology: No results found for this or any previous visit (from the past 720 hour(s)).  Medical History: Past Medical History  Diagnosis Date  . CAD (coronary artery disease)   . PVD (peripheral vascular disease)   . HTN (hypertension)   . Hyperlipidemia   . Cerebrovascular disease   . COPD (chronic obstructive pulmonary disease)     Medications:  Anti-infectives     Start     Dose/Rate Route Frequency Ordered Stop   09/20/11 1100   vancomycin (VANCOCIN) 750 mg in sodium chloride 0.9 % 150 mL IVPB        750 mg 150 mL/hr over 60 Minutes Intravenous Every 12 hours 09/20/11 0256     09/20/11 0400   piperacillin-tazobactam (ZOSYN) IVPB 3.375 g        3.375 g 12.5 mL/hr over 240 Minutes Intravenous 3 times per day 09/20/11 0256     09/19/11 2030   vancomycin (VANCOCIN) IVPB 1000 mg/200 mL premix        1,000 mg 200 mL/hr over 60 Minutes Intravenous  Once 09/19/11 1952 09/20/11 0016   09/19/11 2000   piperacillin-tazobactam (ZOSYN) 3.375 g in dextrose 5 % 50 mL IVPB        3.375 g 100 mL/hr over 30 Minutes Intravenous   Once 09/19/11 1952 09/19/11 2143         Assessment: Patient with Leukocytosis and knee pain.  First dose of antibiotics already given in ED.   Goal of Therapy:  Vancomycin trough level 15-20 mcg/ml Zosyn based on renal function   Plan:  Measure antibiotic drug levels at steady state Follow up culture results Vancomycin 750mg  iv q12hr Zosyn 3.375g IV Q8H infused over 4hrs.   Darlina Guys, Jacquenette Shone Crowford 09/20/2011,2:57 AM

## 2011-09-21 DIAGNOSIS — M1710 Unilateral primary osteoarthritis, unspecified knee: Secondary | ICD-10-CM | POA: Diagnosis not present

## 2011-09-21 DIAGNOSIS — M25469 Effusion, unspecified knee: Secondary | ICD-10-CM | POA: Diagnosis not present

## 2011-09-21 DIAGNOSIS — R197 Diarrhea, unspecified: Secondary | ICD-10-CM | POA: Diagnosis not present

## 2011-09-21 DIAGNOSIS — R509 Fever, unspecified: Secondary | ICD-10-CM | POA: Diagnosis not present

## 2011-09-21 MED ORDER — CLONIDINE HCL 0.2 MG PO TABS
0.2000 mg | ORAL_TABLET | Freq: Two times a day (BID) | ORAL | Status: DC
  Administered 2011-09-21: 0.2 mg via ORAL
  Filled 2011-09-21 (×2): qty 1

## 2011-09-21 MED ORDER — VITAMINS A & D EX OINT
TOPICAL_OINTMENT | CUTANEOUS | Status: AC
  Administered 2011-09-21: 5
  Filled 2011-09-21: qty 5

## 2011-09-21 NOTE — Progress Notes (Signed)
Patient discharge to home, with son. Instructions given to go directly to Dr. Audrie Lia office. D/c home instructions follow up appointments given to patient,PIV removed no s/s of infection and swelling noted.

## 2011-09-21 NOTE — Progress Notes (Signed)
PROGRESS NOTE  [name] ZOX:[id number] DOB: 03-02-1941 DOA: 09/19/2011 PCP: None. Orthopedic surgeon: Aldean Baker, M.D. Cardiologist: Olga Millers, M.D.   Brief narrative: 71 year old woman brought to the emergency department for confusion by family. Per family the patient had displayed visual hallucinations, delusional thoughts and repetitive behavior. ER documentation is notable for the patient being alert and oriented on presentation. History is notable for chronic low back and bilateral knee pain.  Was noted to be oriented in by emergency department physician however because of fever (100.4), tachycardia and leukocytosis patient was referred for admission.  Past medical history: Coronary artery disease, peripheral vascular disease, hypertension, hyperlipidemia, COPD, cerebrovascular disease  Consultants:  None  Procedures:  None  Antibiotics:  February 20-21: Zosyn  February 20-21: Vancomycin  Interim History: Chart reviewed in detail. Interval documentation reviewed.  Subjective: Complains of pain in knee. No confusion. No fever or other complaints.  Objective: Filed Vitals:   09/20/11 1658 09/20/11 2110 09/21/11 0500 09/21/11 0525  BP: 147/73 154/81  160/75  Pulse: 103 108  112  Temp: 97.5 F (36.4 C) 99.1 F (37.3 C)  98.3 F (36.8 C)  TempSrc: Oral Oral  Oral  Resp: 20 18  20   Height: 5\' 3"  (1.6 m)     Weight: 69.8 kg (153 lb 14.1 oz)  69.7 kg (153 lb 10.6 oz)   SpO2: 94% 93%  93%    Intake/Output Summary (Last 24 hours) at 09/21/11 0825 Last data filed at 09/21/11 0344  Gross per 24 hour  Intake    240 ml  Output    800 ml  Net   -560 ml    Exam:   General:  Appears calm and comfortable.  Cardiovascular: Regular rate and rhythm. No murmur, rub, gallop.   Respiratory: Clear to auscultation bilaterally. No wheezes, rales, rhonchi. Normal respiratory effort.  Musculoskeletal: No significant change in effusion of the left knee.  Nontender. No erythema.  Range of movement limited by the size of the effusion. Appears to be generally weak bilaterally and symmetric in nature.  Psychiatric: Grossly normal mood and affect. Speech fluent and appropriate.  Data Reviewed: Basic Metabolic Panel:  Lab 09/20/11 8119 09/19/11 1651  NA 137 136  K 2.8* 3.9  CL 101 96  CO2 25 26  GLUCOSE 124* 161*  BUN 14 28*  CREATININE 0.60 0.82  CALCIUM 8.8 10.9*  MG -- --  PHOS -- --   Liver Function Tests:  Lab 09/20/11 0433 09/19/11 1651  AST 25 28  ALT 19 20  ALKPHOS 100 121*  BILITOT 0.5 0.5  PROT 6.7 8.4*  ALBUMIN 2.7* 3.5   CBC:  Lab 09/20/11 0433 09/19/11 1651  WBC 13.8* 16.7*  NEUTROABS -- 14.0*  HGB 12.5 14.6  HCT 35.5* 41.3  MCV 94.4 92.8  PLT 283 322   Cardiac Enzymes:  Lab 09/19/11 1651  CKTOTAL --  CKMB --  CKMBINDEX --  TROPONINI <0.30   Studies: Dg Chest 2 View  09/19/2011  *RADIOLOGY REPORT*  Clinical Data: Chest discomfort.History of coronary stents.  Smoker with controlled hypertension.  Tobacco abuse.  CHEST - 2 VIEW  Comparison: 03/07/2007  Findings: Hyperinflation. Lateral view degraded by patient arm position.  Midline trachea.  Normal heart size and mediastinal contours for age.  Tortuous descending thoracic aorta. No pleural effusion or pneumothorax.  Clear lungs.  IMPRESSION: Hyperinflation, without acute disease.  Original Report Authenticated By: Consuello Bossier, M.D.   Ct Head Wo Contrast  09/19/2011  *RADIOLOGY  REPORT*  Clinical Data: Altered mental status.  CT HEAD WITHOUT CONTRAST  Technique:  Contiguous axial images were obtained from the base of the skull through the vertex without contrast.  Comparison: None.  Findings: The brain does not show generalized atrophy.  No focal abnormality is noted affecting the brainstem or cerebellum.  Within the cerebral hemispheres, there is a 1 cm low density within the corona radiata.  This is consistent with an infarction, though the age is  indeterminate.  It could be recent.  No large vessel territory or cortical insult.  No mass lesion, hemorrhage, hydrocephalus or extra-axial collection.  The calvarium is unremarkable.  Sinuses, middle ears and mastoids are clear.  IMPRESSION: 1 cm focal low density in the corona radiata region on the left. This is consistent with an infarction, though the age is difficult to establish with certainty.  This could be a recent/acute infarction.  There is no hemorrhage or mass effect.  Original Report Authenticated By: Thomasenia Sales, M.D.   Mr Brain Wo Contrast  09/19/2011  *RADIOLOGY REPORT*  Clinical Data: Altered mental status.  Abnormal head CT.  MRI HEAD WITHOUT CONTRAST  Technique:  Multiplanar, multiecho pulse sequences of the brain and surrounding structures were obtained according to standard protocol without intravenous contrast.  Comparison: Head CT same day  Findings: Diffusion imaging does not show any acute or subacute infarction.  The brainstem and cerebellum are normal.  The cerebral hemispheres show mild chronic small vessel disease within the hemispheric white matter.  There is an old infarction in the left basal ganglia/corona radiata out region that explains the low density seen on CT.  This is not recent.  No large vessel territory insult.  No mass lesion, hemorrhage, hydrocephalus or extra-axial collection.  No pituitary mass.  There is arachnoid herniation into the sella.  No inflammatory sinus disease.  IMPRESSION: The low density questioned at CT represents an old infarction in the left basal ganglia/corona radiata.  There is mild chronic small vessel disease elsewhere within the brain.  No evidence of acute or reversible process.  Original Report Authenticated By: Thomasenia Sales, M.D.   Dg Knee Complete 4 Views Left  09/19/2011  *RADIOLOGY REPORT*  Clinical Data: Left knee pain.  No known injury.  LEFT KNEE - COMPLETE 4+ VIEW  Comparison: None.  Findings: Diffuse osteopenia.   Tricompartmental joint space narrowing and spur formation.  Moderate to large sized effusion. 3.4 cm calcified loose body posteriorly.  No fracture or dislocation seen.  Arterial calcifications.  IMPRESSION:  1.  Tricompartmental degenerative changes. 2.  Moderate to large sized effusion. 3.  Large posterior calcified loose body.  Original Report Authenticated By: Darrol Angel, M.D.    Scheduled Meds:    . carvedilol  6.25 mg Oral BID WC  . docusate sodium  100 mg Oral BID  . rosuvastatin  40 mg Oral Daily  . DISCONTD: piperacillin-tazobactam (ZOSYN)  IV  3.375 g Intravenous Q8H  . DISCONTD: vancomycin  750 mg Intravenous Q12H   Continuous Infusions:    . DISCONTD: sodium chloride 50 mL/hr at 09/20/11 0436     Assessment/Plan: 1. Altered mental status/encephalopathy: Resolved on presentation to the emergency department. Noted to have some low-grade fever which has now resolved. MRI brain negative for acute abnormalities. Poor oral intake and dehydration and may have contributed.  2. Fever: Now resolved. Leukocytosis improved. Urinalysis negative. Blood cultures pending. No source identified. Etiology unclear but favor viral in nature.  3. Left  knee swelling: History is entirely consistent with a progressive gradual process. She has had injections performed in the past and has severe arthritis of the knee. See no evidence to suggest septic joint or infection. She will followup with Dr. Lajoyce Corners in the office today.  4. Hypokalemia: Replete. Order diet.  The son questioned why arthrocentesis was not performed at the bedside. He had been expecting this to be performed for unclear reasons. He requested I make arrangements for inpatient arthrocentesis. However as previously documented I have discussed the plan with the patient last evening and she was agreeable and fully oriented at that time. I did discuss the case with her orthopedic surgeon today who graciously agreed to come over in the  afternoon to perform bedside arthrocentesis before discharge. However when I then again discussed this with the patient and son, the son insisted she be discharged immediately with followup in the outpatient setting this morning. The patient was agreeable to this.   Family Communication: Discussed with son at bedside the above. Disposition Plan: Home today.   Brendia Sacks, MD  [facility] Pager [phone] 09/21/2011, 8:25 AM    LOS: 2 days

## 2011-09-21 NOTE — Discharge Summary (Signed)
Physician Discharge Summary  [name] ZOX:[id number] DOB: 1940-09-14 DOA: 09/19/2011  PCP: No primary provider on file. Orthopedic surgeon: Aldean Baker, M.D. Cardiologist: Olga Millers, M.D.  Admit date: 09/19/2011 Discharge date: 09/21/2011  Discharge Diagnoses:  1. Altered mental status/encephalopathy, resolved 2. Left knee effusion, stable  Discharge Condition: Improved  Disposition: Home  History of present illness:  71 year old woman brought to the emergency department for confusion by family. Per family the patient had displayed visual hallucinations, delusional thoughts and repetitive behavior. ER documentation is notable for the patient being alert and oriented on presentation. History is notable for chronic low back and bilateral knee pain.  Was noted to be oriented in by emergency department physician however because of low-grade fever (100.4), tachycardia and leukocytosis patient was referred for admission.  Hospital Course:  [name] was admitted to medical floor. As noted above she displayed no evidence of encephalopathy on admission. She has had no further fever. No source of infection was discovered. Mentation is intact. Further discussion with her son this morning: She believes her symptoms were secondary to dehydration. He feels that she is mentally at her baseline and ready for discharge. Left knee effusion remains unchanged. These and other issues as listed below. 1. Altered mental status/encephalopathy: Resolved on presentation to the emergency department. Noted to have some low-grade fever which has now resolved. MRI brain negative for acute abnormalities. Poor oral intake and dehydration may have contributed.    2. Fever: Now resolved.Urinalysis negative. Blood cultures pending but no growth today. No source identified. Etiology unclear but favor viral in nature.    3. Left knee swelling: History is entirely consistent with a progressive gradual process. She  has had injections performed in the past (about 6 months ago) and has severe arthritis of the knee. See no evidence to suggest septic joint or infection. She will followup with Dr. Lajoyce Corners in the office today.    4. Hypokalemia: Replete. Order diet.  The son questioned why arthrocentesis was not performed at the bedside. He had been expecting this to be performed for unclear reasons. He requested I make arrangements for inpatient arthrocentesis. However as previously documented I had discussed the plan with the patient last evening and she was agreeable and fully oriented at that time. I did discuss the case with her orthopedic surgeon today who graciously agreed to come over in the afternoon to perform bedside arthrocentesis before discharge. However when I then again discussed this with the patient and son, the son insisted she be discharged immediately with followup in the outpatient setting this morning. The patient was agreeable to this.  Consultants:  None  Procedures:  None  Discharge Instructions  Discharge Orders    Future Orders Please Complete By Expires   Diet - low sodium heart healthy      Activity as tolerated - No restrictions        Medication List  As of 09/21/2011  9:14 AM   TAKE these medications         amLODipine 10 MG tablet   Commonly known as: NORVASC   Take 1 tablet (10 mg total) by mouth daily.      aspirin 81 MG tablet   Take 81 mg by mouth daily.      atorvastatin 40 MG tablet   Commonly known as: LIPITOR   TAKE ONE TABLET BY MOUTH DAILY.      carvedilol 6.25 MG tablet   Commonly known as: COREG   TAKE 1 TABLET  BY MOUTH TWO TIMES A DAY      cloNIDine 0.2 MG tablet   Commonly known as: CATAPRES   Take 0.2 mg by mouth 2 (two) times daily.      hydrochlorothiazide 12.5 MG capsule   Commonly known as: MICROZIDE   Take 1 capsule (12.5 mg total) by mouth every morning.      lisinopril 40 MG tablet   Commonly known as: PRINIVIL,ZESTRIL   Take 1  tablet (40 mg total) by mouth daily.      mulitivitamin with minerals Tabs   Take 1 tablet by mouth daily.           Follow-up Information    Follow up with DUDA,MARCUS V, MD. (Proceed directly to Dr. Audrie Lia office this morning.)    Contact information:   [address] [place] [address] [phone]          The results of significant diagnostics from this hospitalization (including imaging, microbiology, ancillary and laboratory) are listed below for reference.    Significant Diagnostic Studies: Dg Chest 2 View  09/19/2011  *RADIOLOGY REPORT*  Clinical Data: Chest discomfort.History of coronary stents.  Smoker with controlled hypertension.  Tobacco abuse.  CHEST - 2 VIEW  Comparison: 03/07/2007  Findings: Hyperinflation. Lateral view degraded by patient arm position.  Midline trachea.  Normal heart size and mediastinal contours for age.  Tortuous descending thoracic aorta. No pleural effusion or pneumothorax.  Clear lungs.  IMPRESSION: Hyperinflation, without acute disease.  Original Report Authenticated By: Consuello Bossier, M.D.   Ct Head Wo Contrast  09/19/2011  *RADIOLOGY REPORT*  Clinical Data: Altered mental status.  CT HEAD WITHOUT CONTRAST  Technique:  Contiguous axial images were obtained from the base of the skull through the vertex without contrast.  Comparison: None.  Findings: The brain does not show generalized atrophy.  No focal abnormality is noted affecting the brainstem or cerebellum.  Within the cerebral hemispheres, there is a 1 cm low density within the corona radiata.  This is consistent with an infarction, though the age is indeterminate.  It could be recent.  No large vessel territory or cortical insult.  No mass lesion, hemorrhage, hydrocephalus or extra-axial collection.  The calvarium is unremarkable.  Sinuses, middle ears and mastoids are clear.  IMPRESSION: 1 cm focal low density in the corona radiata region on the left. This is  consistent with an infarction, though the age is difficult to establish with certainty.  This could be a recent/acute infarction.  There is no hemorrhage or mass effect.  Original Report Authenticated By: Thomasenia Sales, M.D.   Mr Brain Wo Contrast  09/19/2011  *RADIOLOGY REPORT*  Clinical Data: Altered mental status.  Abnormal head CT.  MRI HEAD WITHOUT CONTRAST  Technique:  Multiplanar, multiecho pulse sequences of the brain and surrounding structures were obtained according to standard protocol without intravenous contrast.  Comparison: Head CT same day  Findings: Diffusion imaging does not show any acute or subacute infarction.  The brainstem and cerebellum are normal.  The cerebral hemispheres show mild chronic small vessel disease within the hemispheric white matter.  There is an old infarction in the left basal ganglia/corona radiata out region that explains the low density seen on CT.  This is not recent.  No large vessel territory insult.  No mass lesion, hemorrhage, hydrocephalus or extra-axial collection.  No pituitary mass.  There is arachnoid herniation into the sella.  No inflammatory sinus disease.  IMPRESSION: The low density questioned  at CT represents an old infarction in the left basal ganglia/corona radiata.  There is mild chronic small vessel disease elsewhere within the brain.  No evidence of acute or reversible process.  Original Report Authenticated By: Thomasenia Sales, M.D.   Dg Knee Complete 4 Views Left  09/19/2011  *RADIOLOGY REPORT*  Clinical Data: Left knee pain.  No known injury.  LEFT KNEE - COMPLETE 4+ VIEW  Comparison: None.  Findings: Diffuse osteopenia.  Tricompartmental joint space narrowing and spur formation.  Moderate to large sized effusion. 3.4 cm calcified loose body posteriorly.  No fracture or dislocation seen.  Arterial calcifications.  IMPRESSION:  1.  Tricompartmental degenerative changes. 2.  Moderate to large sized effusion. 3.  Large posterior calcified loose  body.  Original Report Authenticated By: Darrol Angel, M.D.    Microbiology: Recent Results (from the past 240 hour(s))  CULTURE, BLOOD (ROUTINE X 2)     Status: Normal (Preliminary result)   Collection Time   09/19/11  4:51 PM      Component Value Range Status Comment   Specimen Description BLOOD RIGHT ARM   Final    Special Requests BOTTLES DRAWN AEROBIC AND ANAEROBIC 4CC   Final    Culture  Setup Time [id number]   Final    Culture     Final    Value:        BLOOD CULTURE RECEIVED NO GROWTH TO DATE CULTURE WILL BE HELD FOR 5 DAYS BEFORE ISSUING A FINAL NEGATIVE REPORT   Report Status PENDING   Incomplete   CULTURE, BLOOD (ROUTINE X 2)     Status: Normal (Preliminary result)   Collection Time   09/19/11  8:28 PM      Component Value Range Status Comment   Specimen Description BLOOD LEFT ARM   Final    Special Requests BOTTLES DRAWN AEROBIC AND ANAEROBIC 3CC   Final    Culture  Setup Time [id number]   Final    Culture     Final    Value:        BLOOD CULTURE RECEIVED NO GROWTH TO DATE CULTURE WILL BE HELD FOR 5 DAYS BEFORE ISSUING A FINAL NEGATIVE REPORT   Report Status PENDING   Incomplete      Labs: Basic Metabolic Panel:  Lab 09/20/11 4696 09/19/11 1651  NA 137 136  K 2.8* 3.9  CL 101 96  CO2 25 26  GLUCOSE 124* 161*  BUN 14 28*  CREATININE 0.60 0.82  CALCIUM 8.8 10.9*  MG -- --  PHOS -- --   Liver Function Tests:  Lab 09/20/11 0433 09/19/11 1651  AST 25 28  ALT 19 20  ALKPHOS 100 121*  BILITOT 0.5 0.5  PROT 6.7 8.4*  ALBUMIN 2.7* 3.5   CBC  Lab 09/20/11 0433 09/19/11 1651  WBC 13.8* 16.7*  NEUTROABS -- 14.0*  HGB 12.5 14.6  HCT 35.5* 41.3  MCV 94.4 92.8  PLT 283 322   Cardiac Enzymes:  Lab 09/19/11 1651  CKTOTAL --  CKMB --  CKMBINDEX --  TROPONINI <0.30    Time coordinating discharge: 25 minutes  Signed:  Brendia Sacks, MD  [facility] 09/21/2011, 9:14 AM

## 2011-09-21 NOTE — Progress Notes (Signed)
CARE MANAGEMENT NOTE 09/21/2011  Patient:  [name], [name]   Account Number:  [id number]  Date Initiated:  09/21/2011  Documentation initiated by:  Suhaan Perleberg  Subjective/Objective Assessment:   pt brought to ed due to ams poss tia     Action/Plan:   independently lives at home   Anticipated DC Date:  09/21/2011   Anticipated DC Plan:  HOME/SELF CARE  In-house referral  NA      DC Planning Services  NA      [facility] Choice  NA   Choice offered to / List presented to:  NA   DME arranged  NA      DME agency  NA     HH arranged  NA      HH agency  NA   Status of service:  Completed, signed off Medicare Important Message given?  NA - LOS <3 / Initial given by admissions (If response is "NO", the following Medicare IM given date fields will be blank) Date Medicare IM given:   Date Additional Medicare IM given:    Discharge Disposition:  HOME/SELF CARE  Per UR Regulation:  Reviewed for med. necessity/level of care/duration of stay  Comments:  [id number]/Cinthya Bors,RN,BSn,CCM

## 2011-09-26 LAB — CULTURE, BLOOD (ROUTINE X 2)
Culture  Setup Time: 201302210133
Culture  Setup Time: 201302210133
Culture: NO GROWTH

## 2011-10-09 ENCOUNTER — Telehealth: Payer: Self-pay | Admitting: Cardiology

## 2011-10-09 NOTE — Telephone Encounter (Signed)
Does our primary care take medicare? If so refer to who is available .Olga Millers

## 2011-10-09 NOTE — Telephone Encounter (Signed)
Mrs Cilento is requesting the name of a physician who takes medicare that Dr Marinus Maw would recommend as a pcp for her.

## 2011-10-09 NOTE — Telephone Encounter (Signed)
New msg Pt called and wanted to talk to you about finding pcp that takes medicare patients

## 2011-10-10 NOTE — Telephone Encounter (Signed)
Spoke with pt, talked to several [place] primary care offices. Pt given the name of padona campbell np at [facility] that is taking new pts. She will call me back if she has any problems.

## 2011-10-10 NOTE — Telephone Encounter (Signed)
Fu call °Patient returning your call °

## 2011-10-12 DIAGNOSIS — M25549 Pain in joints of unspecified hand: Secondary | ICD-10-CM | POA: Diagnosis not present

## 2011-10-12 DIAGNOSIS — Z87891 Personal history of nicotine dependence: Secondary | ICD-10-CM | POA: Diagnosis not present

## 2011-10-13 ENCOUNTER — Other Ambulatory Visit: Payer: Self-pay | Admitting: Cardiology

## 2011-10-15 ENCOUNTER — Encounter: Payer: Self-pay | Admitting: Family

## 2011-10-15 ENCOUNTER — Ambulatory Visit (INDEPENDENT_AMBULATORY_CARE_PROVIDER_SITE_OTHER): Payer: Medicare Other | Admitting: Family

## 2011-10-15 ENCOUNTER — Other Ambulatory Visit: Payer: Self-pay

## 2011-10-15 VITALS — BP 140/80 | Ht 62.0 in | Wt 148.0 lb

## 2011-10-15 DIAGNOSIS — M159 Polyosteoarthritis, unspecified: Secondary | ICD-10-CM

## 2011-10-15 DIAGNOSIS — I10 Essential (primary) hypertension: Secondary | ICD-10-CM

## 2011-10-15 DIAGNOSIS — E7800 Pure hypercholesterolemia, unspecified: Secondary | ICD-10-CM | POA: Diagnosis not present

## 2011-10-15 LAB — CBC
HCT: 37.1 % (ref 36.0–46.0)
MCHC: 33.4 g/dL (ref 30.0–36.0)
MCV: 96.1 fl (ref 78.0–100.0)
RDW: 13.3 % (ref 11.5–14.6)

## 2011-10-15 LAB — BASIC METABOLIC PANEL
BUN: 12 mg/dL (ref 6–23)
CO2: 29 mEq/L (ref 19–32)
Chloride: 102 mEq/L (ref 96–112)
Creatinine, Ser: 0.9 mg/dL (ref 0.4–1.2)

## 2011-10-15 LAB — LIPID PANEL: Cholesterol: 113 mg/dL (ref 0–200)

## 2011-10-15 MED ORDER — CLONIDINE HCL 0.2 MG PO TABS: 0.2000 mg | ORAL_TABLET | Freq: Two times a day (BID) | ORAL | Status: DC

## 2011-10-15 NOTE — Progress Notes (Signed)
  Subjective:    Patient ID: [name], female    DOB: 27-Sep-1940, 71 y.o.   MRN: [id number]  HPI 71 year old white female, smoker, patient to the practice and to be established. She has a past medical history of hypertension, osteoarthritis, hyperlipidemia. She is currently stable on her current medications. She sees cardiology and orthopedics. She had a stent placed in 2008. Orthopedics is managing her generalized osteoarthritis. Her last colonoscopy was in 2008 and she is due to return in 2018. She has not had a mammogram in several years. Patient has declined allowing [place] to set up. She wishes to schedule that appointment herself. Denies any medication refills today.   Review of Systems  Constitutional: Negative.   HENT: Negative.   Eyes: Negative.   Respiratory: Negative.   Cardiovascular: Negative.   Gastrointestinal: Negative.   Genitourinary: Negative.   Musculoskeletal: Negative.   Neurological: Negative.   Hematological: Negative.   Psychiatric/Behavioral: Negative.        Objective:   Physical Exam  Constitutional: She is oriented to person, place, and time. She appears well-developed and well-nourished.  HENT:  Head: Normocephalic and atraumatic.  Right Ear: External ear normal.  Left Ear: External ear normal.  Neck: Normal range of motion. Neck supple.       Carotid bruits noted bilaterally.  Cardiovascular: Normal rate, regular rhythm and normal heart sounds.   Pulmonary/Chest: Effort normal and breath sounds normal.  Abdominal: Soft. Bowel sounds are normal.  Musculoskeletal: Normal range of motion.  Neurological: She is alert and oriented to person, place, and time.  Skin: Skin is warm and dry.  Psychiatric: She has a normal mood and affect.          Assessment & Plan:  Assessment: Hypertension, osteoarthritis, hyperlipidemia  Plan: Labs and to include BMP, CBC, lipids Will notify patient of results of her labs. She will return for her welcome to  Medicare physical in a few weeks. Advise healthy diet and exercise. Self breast exams. Encouraged her to schedule her mammogram. We'll followup with patient in physical and sooner when necessary.

## 2011-10-15 NOTE — Patient Instructions (Signed)
Exercise to Stay Healthy  Exercise helps you become and stay healthy.    EXERCISE IDEAS AND TIPS  Choose exercises that:   You enjoy.   Fit into your day.  You do not need to exercise really hard to be healthy. You can do exercises at a slow or medium level and stay healthy. You can:   Stretch before and after working out.   Try yoga, Pilates, or tai chi.   Lift weights.   Walk fast, swim, jog, run, climb stairs, bicycle, dance, or rollerskate.   Take aerobic classes.    Exercises that burn about 150 calories:     Running 1  miles in 15 minutes.   Playing volleyball for 45 to 60 minutes.   Washing and waxing a car for 45 to 60 minutes.   Playing touch football for 45 minutes.   Walking 1  miles in 35 minutes.   Pushing a stroller 1  miles in 30 minutes.   Playing basketball for 30 minutes.   Raking leaves for 30 minutes.   Bicycling 5 miles in 30 minutes.   Walking 2 miles in 30 minutes.   Dancing for 30 minutes.   Shoveling snow for 15 minutes.   Swimming laps for 20 minutes.   Walking up stairs for 15 minutes.   Bicycling 4 miles in 15 minutes.   Gardening for 30 to 45 minutes.   Jumping rope for 15 minutes.   Washing windows or floors for 45 to 60 minutes.  Document Released: 08/18/2010 Document Revised: 07/05/2011 Document Reviewed: 08/18/2010  [employer] Patient Information 2012 [employer]

## 2011-10-22 DIAGNOSIS — M19049 Primary osteoarthritis, unspecified hand: Secondary | ICD-10-CM | POA: Diagnosis not present

## 2011-10-22 DIAGNOSIS — M1710 Unilateral primary osteoarthritis, unspecified knee: Secondary | ICD-10-CM | POA: Diagnosis not present

## 2011-11-15 ENCOUNTER — Other Ambulatory Visit: Payer: Self-pay | Admitting: Cardiology

## 2011-11-21 ENCOUNTER — Other Ambulatory Visit: Payer: Self-pay | Admitting: Cardiology

## 2011-11-21 MED ORDER — CLONIDINE HCL 0.2 MG PO TABS: 0.2000 mg | ORAL_TABLET | Freq: Two times a day (BID) | ORAL | Status: DC

## 2011-11-21 MED ORDER — CARVEDILOL 6.25 MG PO TABS: 6.2500 mg | ORAL_TABLET | Freq: Two times a day (BID) | ORAL | Status: DC

## 2011-12-13 ENCOUNTER — Ambulatory Visit: Payer: Medicare Other | Admitting: Internal Medicine

## 2011-12-25 ENCOUNTER — Encounter: Payer: Medicare Other | Admitting: Family

## 2012-01-09 ENCOUNTER — Ambulatory Visit (INDEPENDENT_AMBULATORY_CARE_PROVIDER_SITE_OTHER): Payer: Medicare Other | Admitting: Family

## 2012-01-09 ENCOUNTER — Encounter: Payer: Self-pay | Admitting: Family

## 2012-01-09 VITALS — BP 120/60 | HR 90 | Resp 20 | Ht 62.0 in | Wt 148.0 lb

## 2012-01-09 DIAGNOSIS — Z0000 Encounter for general adult medical examination without abnormal findings: Secondary | ICD-10-CM | POA: Diagnosis not present

## 2012-01-09 DIAGNOSIS — I10 Essential (primary) hypertension: Secondary | ICD-10-CM | POA: Diagnosis not present

## 2012-01-09 DIAGNOSIS — Z720 Tobacco use: Secondary | ICD-10-CM

## 2012-01-09 DIAGNOSIS — I2510 Atherosclerotic heart disease of native coronary artery without angina pectoris: Secondary | ICD-10-CM

## 2012-01-09 DIAGNOSIS — M1990 Unspecified osteoarthritis, unspecified site: Secondary | ICD-10-CM

## 2012-01-09 DIAGNOSIS — E785 Hyperlipidemia, unspecified: Secondary | ICD-10-CM

## 2012-01-09 DIAGNOSIS — E669 Obesity, unspecified: Secondary | ICD-10-CM | POA: Diagnosis not present

## 2012-01-09 DIAGNOSIS — F17200 Nicotine dependence, unspecified, uncomplicated: Secondary | ICD-10-CM

## 2012-01-09 LAB — CBC WITH DIFFERENTIAL/PLATELET
Basophils Relative: 0.6 % (ref 0.0–3.0)
Eosinophils Absolute: 0.1 10*3/uL (ref 0.0–0.7)
Hemoglobin: 13.7 g/dL (ref 12.0–15.0)
MCHC: 33.7 g/dL (ref 30.0–36.0)
MCV: 95.9 fl (ref 78.0–100.0)
Monocytes Absolute: 0.4 10*3/uL (ref 0.1–1.0)
Neutro Abs: 5.4 10*3/uL (ref 1.4–7.7)
RBC: 4.24 Mil/uL (ref 3.87–5.11)

## 2012-01-09 LAB — LIPID PANEL
HDL: 39.2 mg/dL (ref >39.00)
LDL Cholesterol: 66 mg/dL (ref 0–99)
Total CHOL/HDL Ratio: 3

## 2012-01-09 LAB — BASIC METABOLIC PANEL
CO2: 28 mEq/L (ref 19–32)
Calcium: 9.9 mg/dL (ref 8.4–10.5)
Chloride: 101 mEq/L (ref 96–112)
Sodium: 137 mEq/L (ref 135–145)

## 2012-01-09 LAB — TSH: TSH: 1.84 u[IU]/mL (ref 0.35–5.50)

## 2012-01-09 NOTE — Patient Instructions (Addendum)
Exercise to Stay Healthy Exercise helps you become and stay healthy. EXERCISE IDEAS AND TIPS Choose exercises that:  You enjoy.   Fit into your day.  You do not need to exercise really hard to be healthy. You can do exercises at a slow or medium level and stay healthy. You can:  Stretch before and after working out.   Try yoga, Pilates, or tai chi.   Lift weights.   Walk fast, swim, jog, run, climb stairs, bicycle, dance, or rollerskate.   Take aerobic classes.  Exercises that burn about 150 calories:  Running 1  miles in 15 minutes.   Playing volleyball for 45 to 60 minutes.   Washing and waxing a car for 45 to 60 minutes.   Playing touch football for 45 minutes.   Walking 1  miles in 35 minutes.   Pushing a stroller 1  miles in 30 minutes.   Playing basketball for 30 minutes.   Raking leaves for 30 minutes.   Bicycling 5 miles in 30 minutes.   Walking 2 miles in 30 minutes.   Dancing for 30 minutes.   Shoveling snow for 15 minutes.   Swimming laps for 20 minutes.   Walking up stairs for 15 minutes.   Bicycling 4 miles in 15 minutes.   Gardening for 30 to 45 minutes.   Jumping rope for 15 minutes.   Washing windows or floors for 45 to 60 minutes.  Document Released: 08/18/2010 Document Revised: 07/05/2011 Document Reviewed: 08/18/2010 [facility] Patient Information 2012 [place], [place].  Smoking Cessation This document explains the best ways for you to quit smoking and new treatments to help. It lists new medicines that can double or triple your chances of quitting and quitting for good. It also considers ways to avoid relapses and concerns you may have about quitting, including weight gain. NICOTINE: A POWERFUL ADDICTION If you have tried to quit smoking, you know how hard it can be. It is hard because nicotine is a very addictive drug. For some people, it can be as addictive as heroin or cocaine. Usually, people make 2 or 3 tries, or more, before  finally being able to quit. Each time you try to quit, you can learn about what helps and what hurts. Quitting takes hard work and a lot of effort, but you can quit smoking. QUITTING SMOKING IS ONE OF THE MOST IMPORTANT THINGS YOU WILL EVER DO.  You will live longer, feel better, and live better.   The impact on your body of quitting smoking is felt almost immediately:   Within 20 minutes, blood pressure decreases. Pulse returns to its normal level.   After 8 hours, carbon monoxide levels in the blood return to normal. Oxygen level increases.   After 24 hours, chance of heart attack starts to decrease. Breath, hair, and body stop smelling like smoke.   After 48 hours, damaged nerve endings begin to recover. Sense of taste and smell improve.   After 72 hours, the body is virtually free of nicotine. Bronchial tubes relax and breathing becomes easier.   After 2 to 12 weeks, lungs can hold more air. Exercise becomes easier and circulation improves.   Quitting will reduce your risk of having a heart attack, stroke, cancer, or lung disease:   After 1 year, the risk of coronary heart disease is cut in half.   After 5 years, the risk of stroke falls to the same as a nonsmoker.   After 10 years, the risk of lung  cancer is cut in half and the risk of other cancers decreases significantly.   After 15 years, the risk of coronary heart disease drops, usually to the level of a nonsmoker.   If you are pregnant, quitting smoking will improve your chances of having a healthy baby.   The people you live with, especially your children, will be healthier.   You will have extra money to spend on things other than cigarettes.  FIVE KEYS TO QUITTING Studies have shown that these 5 steps will help you quit smoking and quit for good. You have the best chances of quitting if you use them together: 1. Get ready.  2. Get support and encouragement.  3. Learn new skills and behaviors.  4. Get medicine to  reduce your nicotine addiction and use it correctly.  5. Be prepared for relapse or difficult situations. Be determined to continue trying to quit, even if you do not succeed at first.  1. GET READY  Set a quit date.   Change your environment.   Get rid of ALL cigarettes, ashtrays, matches, and lighters in your home, car, and place of work.   Do not let people smoke in your home.   Review your past attempts to quit. Think about what worked and what did not.   Once you quit, do not smoke. NOT EVEN A PUFF!  2. GET SUPPORT AND ENCOURAGEMENT Studies have shown that you have a better chance of being successful if you have help. You can get support in many ways.  Tell your family, friends, and coworkers that you are going to quit and need their support. Ask them not to smoke around you.   Talk to your caregivers (doctor, dentist, nurse, pharmacist, psychologist, and/or smoking counselor).   Get individual, group, or telephone counseling and support. The more counseling you have, the better your chances are of quitting. Programs are available at [employer] and health centers. Call your local health department for information about programs in your area.   Spiritual beliefs and practices may help some smokers quit.   Quit meters are Photographer that keep track of quit statistics, such as amount of "quit-time," cigarettes not smoked, and money saved.   Many smokers find one or more of the many self-help books available useful in helping them quit and stay off tobacco.  3. LEARN NEW SKILLS AND BEHAVIORS  Try to distract yourself from urges to smoke. Talk to someone, go for a walk, or occupy your time with a task.   When you first try to quit, change your routine. Take a different route to work. Drink tea instead of coffee. Eat breakfast in a different place.   Do something to reduce your stress. Take a hot bath, exercise, or read a book.   Plan  something enjoyable to do every day. Reward yourself for not smoking.   Explore interactive web-based programs that specialize in helping you quit.  4. GET MEDICINE AND USE IT CORRECTLY Medicines can help you stop smoking and decrease the urge to smoke. Combining medicine with the above behavioral methods and support can quadruple your chances of successfully quitting smoking. The U[place]. Food and Drug Administration (FDA) has approved 7 medicines to help you quit smoking. These medicines fall into 3 categories.  Nicotine replacement therapy (delivers nicotine to your body without the negative effects and risks of smoking):   Nicotine gum: Available over-the-counter.   Nicotine lozenges: Available over-the-counter.   Nicotine inhaler:  Available by prescription.   Nicotine nasal spray: Available by prescription.   Nicotine skin patches (transdermal): Available by prescription and over-the-counter.   Antidepressant medicine (helps people abstain from smoking, but how this works is unknown):   Bupropion sustained-release (SR) tablets: Available by prescription.   Nicotinic receptor partial agonist (simulates the effect of nicotine in your brain):   Varenicline tartrate tablets: Available by prescription.   Ask your caregiver for advice about which medicines to use and how to use them. Carefully read the information on the package.   Everyone who is trying to quit may benefit from using a medicine. If you are pregnant or trying to become pregnant, nursing an infant, you are under age 40, or you smoke fewer than 10 cigarettes per day, talk to your caregiver before taking any nicotine replacement medicines.   You should stop using a nicotine replacement product and call your caregiver if you experience nausea, dizziness, weakness, vomiting, fast or irregular heartbeat, mouth problems with the lozenge or gum, or redness or swelling of the skin around the patch that does not go away.   Do not  use any other product containing nicotine while using a nicotine replacement product.   Talk to your caregiver before using these products if you have diabetes, heart disease, asthma, stomach ulcers, you had a recent heart attack, you have high blood pressure that is not controlled with medicine, a history of irregular heartbeat, or you have been prescribed medicine to help you quit smoking.  5. BE PREPARED FOR RELAPSE OR DIFFICULT SITUATIONS  Most relapses occur within the first 3 months after quitting. Do not be discouraged if you start smoking again. Remember, most people try several times before they finally quit.   You may have symptoms of withdrawal because your body is used to nicotine. You may crave cigarettes, be irritable, feel very hungry, cough often, get headaches, or have difficulty concentrating.   The withdrawal symptoms are only temporary. They are strongest when you first quit, but they will go away within 10 to 14 days.  Here are some difficult situations to watch for:  Alcohol. Avoid drinking alcohol. Drinking lowers your chances of successfully quitting.   Caffeine. Try to reduce the amount of caffeine you consume. It also lowers your chances of successfully quitting.   Other smokers. Being around smoking can make you want to smoke. Avoid smokers.   Weight gain. Many smokers will gain weight when they quit, usually less than 10 pounds. Eat a healthy diet and stay active. Do not let weight gain distract you from your main goal, quitting smoking. Some medicines that help you quit smoking may also help delay weight gain. You can always lose the weight gained after you quit.   Bad mood or depression. There are a lot of ways to improve your mood other than smoking.  If you are having problems with any of these situations, talk to your caregiver. SPECIAL SITUATIONS AND CONDITIONS Studies suggest that everyone can quit smoking. Your situation or condition can give you a special  reason to quit.  Pregnant women/new mothers: By quitting, you protect your baby's health and your own.   Hospitalized patients: By quitting, you reduce health problems and help healing.   Heart attack patients: By quitting, you reduce your risk of a second heart attack.   Lung, head, and neck cancer patients: By quitting, you reduce your chance of a second cancer.   Parents of children and adolescents: By quitting, you protect  your children from illnesses caused by secondhand smoke.  QUESTIONS TO THINK ABOUT Think about the following questions before you try to stop smoking. You may want to talk about your answers with your caregiver.  Why do you want to quit?   If you tried to quit in the past, what helped and what did not?   What will be the most difficult situations for you after you quit? How will you plan to handle them?   Who can help you through the tough times? Your family? Friends? Caregiver?   What pleasures do you get from smoking? What ways can you still get pleasure if you quit?  Here are some questions to ask your caregiver:  How can you help me to be successful at quitting?   What medicine do you think would be best for me and how should I take it?   What should I do if I need more help?   What is smoking withdrawal like? How can I get information on withdrawal?  Quitting takes hard work and a lot of effort, but you can quit smoking. FOR MORE INFORMATION  Smokefree.gov (http://www.davis-sullivan.com/) provides free, accurate, evidence-based information and professional assistance to help support the immediate and long-term needs of people trying to quit smoking. Document Released: 07/10/2001 Document Revised: 07/05/2011 Document Reviewed: 05/02/2009 [facility] Patient Information 2012 [place], [place].

## 2012-01-09 NOTE — Progress Notes (Signed)
Subjective:    Patient ID: [name], female    DOB: 24-Jan-1941, 71 y.o.   MRN: [id number]  HPI  71 year old Caucasian female, smoker, presents today for annual physical exam. States she has been in pretty good health overall since her last visit here in March 2013. Monitoring BP at home weekly with readings ranging 120-130/70s. She walks for exercise occasionally and reports that she has been trying to improve her diet over the past few years, but eats out for most of her meals since she lives alone. Reports she is still smoking less than one pack per day. Seeing orthopedics for management of her arthritis.  Review of Systems  Constitutional: Negative.   HENT: Negative for ear pain and congestion.   Eyes: Negative.   Respiratory: Negative.  Negative for cough, shortness of breath and wheezing.   Cardiovascular: Negative for chest pain, palpitations and leg swelling.  Gastrointestinal: Negative.  Negative for diarrhea and constipation.  Genitourinary: Negative.   Musculoskeletal: Positive for joint swelling (occasionally the left knee) and arthralgias (bilateral hands and knees).  Skin: Negative.   Neurological: Negative.   Hematological: Negative.   Psychiatric/Behavioral: Negative.    Past Medical History  Diagnosis Date  . CAD (coronary artery disease)   . PVD (peripheral vascular disease)   . HTN (hypertension)   . Hyperlipidemia   . Cerebrovascular disease   . COPD (chronic obstructive pulmonary disease)     History   Social History  . Marital Status: Single    Spouse Name: N/A    Number of Children: N/A  . Years of Education: N/A   Occupational History  . Not on file.   Social History Main Topics  . Smoking status: Current Everyday Smoker -- 0.2 packs/day    Types: Cigarettes  . Smokeless tobacco: Never Used  . Alcohol Use: No  . Drug Use: No  . Sexually Active: No   Other Topics Concern  . Not on file   Social History Narrative  . No narrative on  file    Past Surgical History  Procedure Date  . Knee surgery   . Abdominal hysterectomy     Family History  Problem Relation Age of Onset  . Breast cancer Mother   . Leukemia Father   . Stroke Sister   . Bone cancer      No Known Allergies  Current Outpatient Prescriptions on File Prior to Visit  Medication Sig Dispense Refill  . amLODipine (NORVASC) 10 MG tablet Take 1 tablet (10 mg total) by mouth daily.  90 tablet  3  . aspirin 81 MG tablet Take 81 mg by mouth daily.        [name] atorvastatin (LIPITOR) 40 MG tablet TAKE ONE TABLET BY MOUTH DAILY.  90 tablet  1  . carvedilol (COREG) 6.25 MG tablet Take 1 tablet (6.25 mg total) by mouth 2 (two) times daily with a meal.  90 tablet  1  . cloNIDine (CATAPRES) 0.2 MG tablet Take 1 tablet (0.2 mg total) by mouth 2 (two) times daily.  180 tablet  2  . hydrochlorothiazide (MICROZIDE) 12.5 MG capsule Take 1 capsule (12.5 mg total) by mouth every morning.  90 capsule  3  . lisinopril (PRINIVIL,ZESTRIL) 40 MG tablet Take 1 tablet (40 mg total) by mouth daily.  90 tablet  3  . Multiple Vitamin (MULITIVITAMIN WITH MINERALS) TABS Take 1 tablet by mouth daily.        BP 120/60  Pulse 90  Resp 20  Ht 5\' 2"  (1.575 m)  Wt 148 lb (67.132 kg)  BMI 27.07 kg/m2chart    Objective:   Physical Exam  Constitutional: She is oriented to person, place, and time. She appears well-developed and well-nourished. No distress.  HENT:  Head: Normocephalic.  Right Ear: External ear normal.  Left Ear: External ear normal.  Nose: Nose normal.  Mouth/Throat: Oropharynx is clear and moist.  Eyes: Conjunctivae and EOM are normal. Pupils are equal, round, and reactive to light. Right eye exhibits no discharge. Left eye exhibits no discharge. No scleral icterus.  Neck: Normal range of motion. Neck supple. No JVD present. No thyromegaly present.  Cardiovascular: Normal rate, regular rhythm, normal heart sounds and intact distal pulses.  Exam reveals no gallop and  no friction rub.   No murmur heard.      Carotid bruits noted bilaterally  Pulmonary/Chest: Effort normal and breath sounds normal. No respiratory distress. She has no wheezes. She has no rales. She exhibits no tenderness.  Abdominal: Soft. Bowel sounds are normal. She exhibits no distension and no mass. There is no tenderness. There is no rebound and no guarding.  Genitourinary:       Deferred at this time  Musculoskeletal: Normal range of motion. She exhibits no edema and no tenderness.  Lymphadenopathy:    She has no cervical adenopathy.  Neurological: She is alert and oriented to person, place, and time. She has normal reflexes. No cranial nerve deficit.  Skin: Skin is warm and dry. No rash noted. She is not diaphoretic. No erythema. No pallor.  Psychiatric: She has a normal mood and affect. Her behavior is normal.          Assessment & Plan:  Assessment: Preventative health care, Hypertension, Hyperlipidemia, CAD, Obesity, Arthritis, Tobacco Abuse  Plan: Due for a mammogram, but she would like to schedule this herself. Labs sent to include TSH, BMP, CBC, and lipid panel. Will contact her with results when available and make adjustments to medications as necessary. Discussed importance of smoking cessation and provided written education material. Continue current medications for hypertension as her BP is currently well controlled. Continue atorvastatin for hyperlipidemia. Advised to call office with any questions or concerns. Follow up as needed.

## 2012-01-14 ENCOUNTER — Other Ambulatory Visit: Payer: Self-pay | Admitting: Cardiology

## 2012-02-07 ENCOUNTER — Other Ambulatory Visit: Payer: Self-pay | Admitting: Cardiology

## 2012-04-10 DIAGNOSIS — Z23 Encounter for immunization: Secondary | ICD-10-CM | POA: Diagnosis not present

## 2012-05-14 ENCOUNTER — Other Ambulatory Visit: Payer: Self-pay | Admitting: Cardiology

## 2012-05-21 ENCOUNTER — Other Ambulatory Visit: Payer: Self-pay | Admitting: Cardiology

## 2012-07-10 ENCOUNTER — Ambulatory Visit (INDEPENDENT_AMBULATORY_CARE_PROVIDER_SITE_OTHER): Payer: Medicare Other | Admitting: Cardiology

## 2012-07-10 ENCOUNTER — Encounter: Payer: Self-pay | Admitting: Cardiology

## 2012-07-10 VITALS — BP 125/75 | HR 75 | Ht 63.0 in | Wt 144.8 lb

## 2012-07-10 DIAGNOSIS — I10 Essential (primary) hypertension: Secondary | ICD-10-CM

## 2012-07-10 DIAGNOSIS — I6529 Occlusion and stenosis of unspecified carotid artery: Secondary | ICD-10-CM

## 2012-07-10 DIAGNOSIS — R0989 Other specified symptoms and signs involving the circulatory and respiratory systems: Secondary | ICD-10-CM | POA: Diagnosis not present

## 2012-07-10 DIAGNOSIS — I679 Cerebrovascular disease, unspecified: Secondary | ICD-10-CM

## 2012-07-10 DIAGNOSIS — I2510 Atherosclerotic heart disease of native coronary artery without angina pectoris: Secondary | ICD-10-CM | POA: Diagnosis not present

## 2012-07-10 NOTE — Patient Instructions (Addendum)
Your physician wants you to follow-up in: ONE YEAR WITH DR Shelda Pal will receive a reminder letter in the mail two months in advance. If you don't receive a letter, please call our office to schedule the follow-up appointment.   Your physician has requested that you have an abdominal aorta duplex. During this test, an ultrasound is used to evaluate the aorta. Allow 30 minutes for this exam. Do not eat after midnight the day before and avoid carbonated beverages   Your physician has requested that you have a carotid duplex. This test is an ultrasound of the carotid arteries in your neck. It looks at blood flow through these arteries that supply the brain with blood. Allow one hour for this exam. There are no restrictions or special instructions.SCHEDULE IN WU[facility],8119

## 2012-07-10 NOTE — Assessment & Plan Note (Signed)
Continue statin. Lipids and liver monitored by primary care. 

## 2012-07-10 NOTE — Assessment & Plan Note (Signed)
Patient with occasional vague abdominal pain and bruit on examination. We'll arrange ultrasound to exclude aneurysm. Otherwise she is to followup with primary care for this issue.

## 2012-07-10 NOTE — Assessment & Plan Note (Signed)
Patient counseled on discontinuing. 

## 2012-07-10 NOTE — Assessment & Plan Note (Addendum)
Continue aspirin and statin. followup carotid Dopplers February 2014.

## 2012-07-10 NOTE — Assessment & Plan Note (Signed)
Blood pressure controlled. Continue present medications. 

## 2012-07-10 NOTE — Progress Notes (Signed)
HPI: [name] is a pleasant female who has had a history of coronary artery disease. She is status post PCI of her circumflex and right coronary artery with drug-eluting stents in August 2008. A Myoview was performed in December of 2012. There was a small fixed apical defect consistent with thinning but no ischemia. Ejection fraction 75%. Last carotid Dopplers were performed in feb 2013. There was 40-59% left stenosis and 0-39% right and followup was recommended in one year. Since I last saw her in Nov 2012, the patient has dyspnea with more extreme activities but not with routine activities. It is relieved with rest. It is not associated with chest pain. There is no orthopnea, PND or pedal edema. There is no syncope or palpitations. There is no exertional chest pain.   Current Outpatient Prescriptions  Medication Sig Dispense Refill  . amLODipine (NORVASC) 10 MG tablet TAKE 1 TABLET (10 MG TOTAL) BY MOUTH DAILY.  90 tablet  3  . aspirin 81 MG tablet Take 81 mg by mouth daily.        [name] atorvastatin (LIPITOR) 40 MG tablet TAKE ONE TABLET BY MOUTH DAILY.  90 tablet  1  . carvedilol (COREG) 6.25 MG tablet Take 1 tablet (6.25 mg total) by mouth 2 (two) times daily with a meal.  90 tablet  1  . carvedilol (COREG) 6.25 MG tablet TAKE 1 TABLET BY MOUTH TWO TIMES A DAY  60 tablet  12  . cloNIDine (CATAPRES) 0.2 MG tablet Take 1 tablet (0.2 mg total) by mouth 2 (two) times daily.  180 tablet  2  . diclofenac (VOLTAREN) 75 MG EC tablet       . hydrochlorothiazide (MICROZIDE) 12.5 MG capsule TAKE 1 CAPSULE (12.5 MG TOTAL) BY MOUTH EVERY MORNING.  90 capsule  3  . lisinopril (PRINIVIL,ZESTRIL) 40 MG tablet TAKE 1 TABLET (40 MG TOTAL) BY MOUTH DAILY.  90 tablet  3  . Multiple Vitamin (MULITIVITAMIN WITH MINERALS) TABS Take 1 tablet by mouth daily.         Past Medical History  Diagnosis Date  . CAD (coronary artery disease)   . PVD (peripheral vascular disease)   . HTN (hypertension)   .  Hyperlipidemia   . Cerebrovascular disease   . COPD (chronic obstructive pulmonary disease)     Past Surgical History  Procedure Date  . Knee surgery   . Abdominal hysterectomy     History   Social History  . Marital Status: Single    Spouse Name: N/A    Number of Children: N/A  . Years of Education: N/A   Occupational History  . Not on file.   Social History Main Topics  . Smoking status: Current Every Day Smoker -- 0.2 packs/day    Types: Cigarettes  . Smokeless tobacco: Never Used  . Alcohol Use: No  . Drug Use: No  . Sexually Active: No   Other Topics Concern  . Not on file   Social History Narrative  . No narrative on file    ROS: knee arthralgias and some occasional abdominal pain for several seconds and improved with changing position. no fevers or chills, productive cough, hemoptysis, dysphasia, odynophagia, melena, hematochezia, dysuria, hematuria, rash, seizure activity, orthopnea, PND, pedal edema, claudication. Remaining systems are negative.  Physical Exam: Well-developed well-nourished in no acute distress.  Skin is warm and dry.  HEENT is normal.  Neck is supple.  Chest diminished breath sounds throughout Cardiovascular exam is regular rate and rhythm.  Abdominal exam nontender or distended. Question pulsatile mass and bruit. Extremities show no edema. neuro grossly intact  ECG sinus rhythm with PACs. No ST changes.

## 2012-07-10 NOTE — Assessment & Plan Note (Signed)
Continue aspirin and statin. 

## 2012-08-05 DIAGNOSIS — M1710 Unilateral primary osteoarthritis, unspecified knee: Secondary | ICD-10-CM | POA: Diagnosis not present

## 2012-08-05 DIAGNOSIS — M169 Osteoarthritis of hip, unspecified: Secondary | ICD-10-CM | POA: Diagnosis not present

## 2012-08-08 ENCOUNTER — Encounter (INDEPENDENT_AMBULATORY_CARE_PROVIDER_SITE_OTHER): Payer: Medicare Other

## 2012-08-08 DIAGNOSIS — I700 Atherosclerosis of aorta: Secondary | ICD-10-CM | POA: Diagnosis not present

## 2012-08-08 DIAGNOSIS — R0989 Other specified symptoms and signs involving the circulatory and respiratory systems: Secondary | ICD-10-CM | POA: Diagnosis not present

## 2012-08-13 ENCOUNTER — Encounter: Payer: Self-pay | Admitting: Cardiovascular Disease

## 2012-08-13 ENCOUNTER — Ambulatory Visit (INDEPENDENT_AMBULATORY_CARE_PROVIDER_SITE_OTHER): Payer: Medicare Other | Admitting: Cardiovascular Disease

## 2012-08-13 VITALS — BP 128/78 | HR 76 | Ht 63.0 in | Wt 141.7 lb

## 2012-08-13 DIAGNOSIS — I739 Peripheral vascular disease, unspecified: Secondary | ICD-10-CM

## 2012-08-13 DIAGNOSIS — I2510 Atherosclerotic heart disease of native coronary artery without angina pectoris: Secondary | ICD-10-CM | POA: Diagnosis not present

## 2012-08-13 DIAGNOSIS — F17200 Nicotine dependence, unspecified, uncomplicated: Secondary | ICD-10-CM

## 2012-08-13 NOTE — Patient Instructions (Addendum)
Your physician has requested that you have an ankle brachial index (ABI). During this test an ultrasound and blood pressure cuff are used to evaluate the arteries that supply the arms and legs with blood. Allow thirty minutes for this exam. There are no restrictions or special instructions.  Your physician wants you to follow-up in: 4 months.   You will receive a reminder letter in the mail two months in advance. If you don't receive a letter, please call our office to schedule the follow-up appointment.  Your physician recommends that you continue on your current medications as directed. Please refer to the Current Medication list given to you today.

## 2012-08-24 NOTE — Assessment & Plan Note (Signed)
The patient is stable from a cardiac standpoint with no symptoms suggestive of angina. Continue medical therapy. Regarding preoperative evaluation for her hip surgery, she should be considered at low to moderate risk from a cardiac standpoint given the lack of anginal symptoms. Recent ECG was normal and a nuclear stress test in 2012 showed no significant ischemia.

## 2012-08-24 NOTE — Progress Notes (Signed)
Primary cardiologist: Dr. Jens Som.  HPI  Ms. [name] is a pleasant 72 year old female who has referred by Dr. Jens Som for evaluation and management of peripheral arterial disease.  She is status post PCI of her circumflex and right coronary artery with drug-eluting stents in August 2008. A Myoview was performed in December of 2012. There was a small fixed apical defect consistent with thinning but no ischemia. Ejection fraction 75%. Last carotid Dopplers were performed in feb 2013. There was 40-59% left stenosis and 0-39% right.  She was noted recently to have an abdominal bruit and thus aortoiliac duplex was performed which showed moderate SMA stenosis, moderate to severe aortoiliac atherosclerosis with moderate right common iliac artery stenosis and severe left common iliac artery stenosis. The patient has vague abdominal discomfort but no postprandial pain. She has severe osteoarthritis in the right hip and is going to have surgery next month. Most of her discomfort is located in the right hip which significantly affects her mobility. She denies discomfort in the left side. She denies chest pain or significant dyspnea.  No Known Allergies   Current Outpatient Prescriptions on File Prior to Visit  Medication Sig Dispense Refill  . amLODipine (NORVASC) 10 MG tablet TAKE 1 TABLET (10 MG TOTAL) BY MOUTH DAILY.  90 tablet  3  . aspirin 81 MG tablet Take 81 mg by mouth daily.        [name] atorvastatin (LIPITOR) 40 MG tablet TAKE ONE TABLET BY MOUTH DAILY.  90 tablet  1  . carvedilol (COREG) 6.25 MG tablet Take 1 tablet (6.25 mg total) by mouth 2 (two) times daily with a meal.  90 tablet  1  . cloNIDine (CATAPRES) 0.2 MG tablet Take 1 tablet (0.2 mg total) by mouth 2 (two) times daily.  180 tablet  2  . diclofenac (VOLTAREN) 75 MG EC tablet       . hydrochlorothiazide (MICROZIDE) 12.5 MG capsule TAKE 1 CAPSULE (12.5 MG TOTAL) BY MOUTH EVERY MORNING.  90 capsule  3  . lisinopril (PRINIVIL,ZESTRIL) 40  MG tablet TAKE 1 TABLET (40 MG TOTAL) BY MOUTH DAILY.  90 tablet  3  . Multiple Vitamin (MULITIVITAMIN WITH MINERALS) TABS Take 1 tablet by mouth daily.         Past Medical History  Diagnosis Date  . CAD (coronary artery disease)   . PVD (peripheral vascular disease)   . HTN (hypertension)   . Hyperlipidemia   . Cerebrovascular disease   . COPD (chronic obstructive pulmonary disease)      Past Surgical History  Procedure Date  . Knee surgery   . Abdominal hysterectomy      Family History  Problem Relation Age of Onset  . Breast cancer Mother   . Leukemia Father   . Stroke Sister   . Bone cancer       History   Social History  . Marital Status: Single    Spouse Name: N/A    Number of Children: N/A  . Years of Education: N/A   Occupational History  . Not on file.   Social History Main Topics  . Smoking status: Current Every Day Smoker -- 0.2 packs/day    Types: Cigarettes  . Smokeless tobacco: Never Used  . Alcohol Use: No  . Drug Use: No  . Sexually Active: No   Other Topics Concern  . Not on file   Social History Narrative  . No narrative on file     ROS Constitutional: Negative for fever,  chills, diaphoresis, activity change, appetite change and fatigue.  HENT: Negative for hearing loss, nosebleeds, congestion, sore throat, facial swelling, drooling, trouble swallowing, neck pain, voice change, sinus pressure and tinnitus.  Eyes: Negative for photophobia, pain, discharge and visual disturbance.  Respiratory: Negative for apnea, cough, chest tightness, shortness of breath and wheezing.  Cardiovascular: Negative for chest pain, palpitations and leg swelling.  Gastrointestinal: Negative for nausea, vomiting, abdominal pain, diarrhea, constipation, blood in stool and abdominal distention.  Genitourinary: Negative for dysuria, urgency, frequency, hematuria and decreased urine volume.  Skin: Negative for color change, pallor, rash and wound.    Neurological: Negative for dizziness, tremors, seizures, syncope, speech difficulty, weakness, light-headedness, numbness and headaches.  Psychiatric/Behavioral: Negative for suicidal ideas, hallucinations, behavioral problems and agitation. The patient is not nervous/anxious.     PHYSICAL EXAM   BP 128/78  Pulse 76  Ht 5\' 3"  (1.6 m)  Wt 141 lb 11.2 oz (64.275 kg)  BMI 25.10 kg/m2  SpO2 95% Constitutional: She is oriented to person, place, and time. She appears well-developed and well-nourished. No distress.  HENT: No nasal discharge.  Head: Normocephalic and atraumatic.  Eyes: Pupils are equal and round. Right eye exhibits no discharge. Left eye exhibits no discharge.  Neck: Normal range of motion. Neck supple. No JVD present. No thyromegaly present.  Cardiovascular: Normal rate, regular rhythm, normal heart sounds. Exam reveals no gallop and no friction rub. There is a 1/6 systolic ejection murmur at the aortic area. Pulmonary/Chest: Effort normal and breath sounds normal. No stridor. No respiratory distress. She has no wheezes. She has no rales. She exhibits no tenderness.  Abdominal: Soft. Bowel sounds are normal. She exhibits no distension. There is no tenderness. There is no rebound and no guarding.  Musculoskeletal: Normal range of motion. She exhibits no edema and no tenderness.  Neurological: She is alert and oriented to person, place, and time. Coordination normal.  Skin: Skin is warm and dry. No rash noted. She is not diaphoretic. No erythema. No pallor.  Psychiatric: She has a normal mood and affect. Her behavior is normal. Judgment and thought content normal.  Vascular: Femoral pulse is normal on the right side and +1 on the left side. Distal pulses are very diminished.   Recent EKG in December showed normal sinus rhythm with no significant ST or T wave changes.   ASSESSMENT AND PLAN

## 2012-08-24 NOTE — Assessment & Plan Note (Signed)
I advised her to quit smoking. 

## 2012-08-24 NOTE — Assessment & Plan Note (Signed)
The patient has moderate to severe aortoiliac atherosclerosis with severe left common iliac artery stenosis and moderate right common iliac artery stenosis. Based on that, I expect that she should have left-sided buttock and hip claudication. However, most of her symptoms are located in the right hip likely due to osteoarthritis. This has significantly limited her mobility and that explains lack of clear claudication. Due to that, I don't see a clear advantage for revascularization at this time. She is going to undergo right hip replacement next month. I went to reevaluate his symptoms after she recovers from that and will bring her for followup with me in 4 months from now. I will request an ABI before the surgery to make sure she does not have significant vascular compromise.

## 2012-08-26 ENCOUNTER — Encounter (INDEPENDENT_AMBULATORY_CARE_PROVIDER_SITE_OTHER): Payer: Medicare Other

## 2012-08-26 DIAGNOSIS — I739 Peripheral vascular disease, unspecified: Secondary | ICD-10-CM | POA: Diagnosis not present

## 2012-08-27 ENCOUNTER — Encounter (HOSPITAL_COMMUNITY): Payer: Self-pay | Admitting: Pharmacy Technician

## 2012-09-03 ENCOUNTER — Encounter (HOSPITAL_COMMUNITY)
Admission: RE | Admit: 2012-09-03 | Discharge: 2012-09-03 | Disposition: A | Discharge status: Home / Self Care | Payer: Medicare Other | Source: Ambulatory Visit | Attending: Orthopedic Surgery | Admitting: Orthopedic Surgery

## 2012-09-03 ENCOUNTER — Encounter (HOSPITAL_COMMUNITY): Payer: Self-pay

## 2012-09-03 ENCOUNTER — Other Ambulatory Visit (HOSPITAL_COMMUNITY): Payer: Self-pay | Admitting: Orthopedic Surgery

## 2012-09-03 HISTORY — DX: Unspecified osteoarthritis, unspecified site: M19.90

## 2012-09-03 LAB — CBC
HCT: 43 % (ref 36.0–46.0)
MCV: 93.7 fL (ref 78.0–100.0)
Platelets: 284 10*3/uL (ref 150–400)
RBC: 4.59 MIL/uL (ref 3.87–5.11)
WBC: 7.4 10*3/uL (ref 4.0–10.5)

## 2012-09-03 LAB — COMPREHENSIVE METABOLIC PANEL
AST: 33 U/L (ref 0–37)
Albumin: 3.7 g/dL (ref 3.5–5.2)
Alkaline Phosphatase: 134 U/L — ABNORMAL HIGH (ref 39–117)
BUN: 20 mg/dL (ref 6–23)
CO2: 29 mEq/L (ref 19–32)
Chloride: 101 mEq/L (ref 96–112)
Creatinine, Ser: 0.81 mg/dL (ref 0.50–1.10)
GFR calc non Af Amer: 71 mL/min — ABNORMAL LOW (ref >90)
Potassium: 3.8 mEq/L (ref 3.5–5.1)
Total Bilirubin: 0.2 mg/dL — ABNORMAL LOW (ref 0.3–1.2)

## 2012-09-03 LAB — ABO/RH: ABO/RH(D): O POS

## 2012-09-03 LAB — APTT: aPTT: 37 seconds (ref 24–37)

## 2012-09-03 LAB — TYPE AND SCREEN

## 2012-09-03 LAB — PROTIME-INR: INR: 0.93 (ref 0.00–1.49)

## 2012-09-03 NOTE — Pre-Procedure Instructions (Signed)
[name]  09/03/2012   Your procedure is scheduled on:  Wednesday, February 12th  Report to [facility] at 0630 AM.  Call this number if you have problems the morning of surgery: [phone]   Remember:   Do not eat food or drink liquids after midnight.    Take these medicines the morning of surgery with A SIP OF WATER: norvasc, coreg, clonidine    Do not wear jewelry, make-up or nail polish.  Do not wear lotions, powders, or perfumes. You may not wear deodorant.  Do not shave 48 hours prior to surgery. Men may shave face and neck.  Do not bring valuables to the hospital.  Contacts, dentures or bridgework may not be worn into surgery.  Leave suitcase in the car. After surgery it may be brought to your room.  For patients admitted to the hospital, checkout time is 11:00 AM the day of discharge.   Patients discharged the day of surgery will not be allowed to drive home.   Special Instructions: Shower using CHG 2 nights before surgery and the night before surgery.  If you shower the day of surgery use CHG.  Use special wash - you have one bottle of CHG for all showers.  You should use approximately 1/3 of the bottle for each shower.   Please read over the following fact sheets that you were given: Pain Booklet, Coughing and Deep Breathing, Blood Transfusion Information, MRSA Information and Surgical Site Infection Prevention

## 2012-09-03 NOTE — Progress Notes (Signed)
Primary Physician - Dr. Sarita [facility] - Dr. Jens Som Stress Test approximately 6 months ago in epic, ekg, echo - all in epic

## 2012-09-05 ENCOUNTER — Encounter (INDEPENDENT_AMBULATORY_CARE_PROVIDER_SITE_OTHER): Payer: Medicare Other

## 2012-09-05 DIAGNOSIS — I6529 Occlusion and stenosis of unspecified carotid artery: Secondary | ICD-10-CM

## 2012-09-09 MED ORDER — CEFAZOLIN SODIUM-DEXTROSE 2-3 GM-% IV SOLR
2.0000 g | INTRAVENOUS | Status: AC
  Administered 2012-09-10: 2 g via INTRAVENOUS
  Filled 2012-09-09: qty 50

## 2012-09-10 ENCOUNTER — Encounter (HOSPITAL_COMMUNITY)
Admission: RE | Disposition: A | Discharge status: Skilled Nursing Facility | Payer: Self-pay | Source: Ambulatory Visit | Attending: Orthopedic Surgery

## 2012-09-10 ENCOUNTER — Inpatient Hospital Stay (HOSPITAL_COMMUNITY): Payer: Medicare Other | Admitting: Certified Registered"

## 2012-09-10 ENCOUNTER — Inpatient Hospital Stay (HOSPITAL_COMMUNITY)
Admission: RE | Admit: 2012-09-10 | Discharge: 2012-09-13 | DRG: 470 | Disposition: A | Discharge status: Skilled Nursing Facility | Payer: Medicare Other | Source: Ambulatory Visit | Attending: Orthopedic Surgery | Admitting: Orthopedic Surgery

## 2012-09-10 ENCOUNTER — Encounter (HOSPITAL_COMMUNITY): Payer: Self-pay | Admitting: Certified Registered"

## 2012-09-10 ENCOUNTER — Encounter (HOSPITAL_COMMUNITY): Payer: Self-pay | Admitting: Surgery

## 2012-09-10 ENCOUNTER — Inpatient Hospital Stay (HOSPITAL_COMMUNITY): Payer: Medicare Other

## 2012-09-10 DIAGNOSIS — I10 Essential (primary) hypertension: Secondary | ICD-10-CM | POA: Diagnosis not present

## 2012-09-10 DIAGNOSIS — I2510 Atherosclerotic heart disease of native coronary artery without angina pectoris: Secondary | ICD-10-CM | POA: Diagnosis present

## 2012-09-10 DIAGNOSIS — E782 Mixed hyperlipidemia: Secondary | ICD-10-CM | POA: Diagnosis not present

## 2012-09-10 DIAGNOSIS — F17200 Nicotine dependence, unspecified, uncomplicated: Secondary | ICD-10-CM | POA: Diagnosis present

## 2012-09-10 DIAGNOSIS — Z9861 Coronary angioplasty status: Secondary | ICD-10-CM

## 2012-09-10 DIAGNOSIS — Z01812 Encounter for preprocedural laboratory examination: Secondary | ICD-10-CM

## 2012-09-10 DIAGNOSIS — I739 Peripheral vascular disease, unspecified: Secondary | ICD-10-CM | POA: Diagnosis not present

## 2012-09-10 DIAGNOSIS — Z471 Aftercare following joint replacement surgery: Secondary | ICD-10-CM | POA: Diagnosis not present

## 2012-09-10 DIAGNOSIS — I679 Cerebrovascular disease, unspecified: Secondary | ICD-10-CM | POA: Diagnosis present

## 2012-09-10 DIAGNOSIS — M1610 Unilateral primary osteoarthritis, unspecified hip: Secondary | ICD-10-CM | POA: Diagnosis not present

## 2012-09-10 DIAGNOSIS — J4489 Other specified chronic obstructive pulmonary disease: Secondary | ICD-10-CM | POA: Diagnosis present

## 2012-09-10 DIAGNOSIS — S8990XA Unspecified injury of unspecified lower leg, initial encounter: Secondary | ICD-10-CM | POA: Diagnosis not present

## 2012-09-10 DIAGNOSIS — M25569 Pain in unspecified knee: Secondary | ICD-10-CM | POA: Diagnosis not present

## 2012-09-10 DIAGNOSIS — Z79899 Other long term (current) drug therapy: Secondary | ICD-10-CM

## 2012-09-10 DIAGNOSIS — J449 Chronic obstructive pulmonary disease, unspecified: Secondary | ICD-10-CM | POA: Diagnosis not present

## 2012-09-10 DIAGNOSIS — M1611 Unilateral primary osteoarthritis, right hip: Secondary | ICD-10-CM

## 2012-09-10 DIAGNOSIS — R262 Difficulty in walking, not elsewhere classified: Secondary | ICD-10-CM | POA: Diagnosis not present

## 2012-09-10 DIAGNOSIS — S72009D Fracture of unspecified part of neck of unspecified femur, subsequent encounter for closed fracture with routine healing: Secondary | ICD-10-CM | POA: Diagnosis not present

## 2012-09-10 DIAGNOSIS — R279 Unspecified lack of coordination: Secondary | ICD-10-CM | POA: Diagnosis not present

## 2012-09-10 DIAGNOSIS — Z96649 Presence of unspecified artificial hip joint: Secondary | ICD-10-CM | POA: Diagnosis not present

## 2012-09-10 DIAGNOSIS — Z7982 Long term (current) use of aspirin: Secondary | ICD-10-CM

## 2012-09-10 DIAGNOSIS — M169 Osteoarthritis of hip, unspecified: Principal | ICD-10-CM | POA: Diagnosis present

## 2012-09-10 DIAGNOSIS — M6281 Muscle weakness (generalized): Secondary | ICD-10-CM | POA: Diagnosis not present

## 2012-09-10 DIAGNOSIS — E785 Hyperlipidemia, unspecified: Secondary | ICD-10-CM | POA: Diagnosis not present

## 2012-09-10 HISTORY — PX: TOTAL HIP ARTHROPLASTY: SHX124

## 2012-09-10 SURGERY — ARTHROPLASTY, HIP, TOTAL,POSTERIOR APPROACH
Anesthesia: General | Site: Hip | Laterality: Right | Wound class: Clean

## 2012-09-10 MED ORDER — HYDROCHLOROTHIAZIDE 12.5 MG PO CAPS
12.5000 mg | ORAL_CAPSULE | Freq: Every day | ORAL | Status: DC
  Administered 2012-09-12: 12.5 mg via ORAL
  Filled 2012-09-10 (×3): qty 1

## 2012-09-10 MED ORDER — AMLODIPINE BESYLATE 10 MG PO TABS
10.0000 mg | ORAL_TABLET | Freq: Every day | ORAL | Status: DC
  Administered 2012-09-12: 10 mg via ORAL
  Filled 2012-09-10 (×3): qty 1

## 2012-09-10 MED ORDER — NEOSTIGMINE METHYLSULFATE 1 MG/ML IJ SOLN
INTRAMUSCULAR | Status: DC | PRN
  Administered 2012-09-10: 3 mg via INTRAVENOUS

## 2012-09-10 MED ORDER — ASPIRIN EC 325 MG PO TBEC
325.0000 mg | DELAYED_RELEASE_TABLET | Freq: Every day | ORAL | Status: DC
  Administered 2012-09-11 – 2012-09-13 (×3): 325 mg via ORAL
  Filled 2012-09-10 (×4): qty 1

## 2012-09-10 MED ORDER — METHOCARBAMOL 500 MG PO TABS
500.0000 mg | ORAL_TABLET | Freq: Four times a day (QID) | ORAL | Status: DC | PRN
  Administered 2012-09-10 – 2012-09-13 (×2): 500 mg via ORAL
  Filled 2012-09-10 (×2): qty 1

## 2012-09-10 MED ORDER — ACETAMINOPHEN 650 MG RE SUPP: 650.0000 mg | Freq: Four times a day (QID) | RECTAL | Status: DC | PRN

## 2012-09-10 MED ORDER — ATORVASTATIN CALCIUM 40 MG PO TABS
40.0000 mg | ORAL_TABLET | Freq: Every day | ORAL | Status: DC
  Administered 2012-09-10 – 2012-09-12 (×3): 40 mg via ORAL
  Filled 2012-09-10 (×4): qty 1

## 2012-09-10 MED ORDER — ACETAMINOPHEN 10 MG/ML IV SOLN
INTRAVENOUS | Status: AC
  Filled 2012-09-10: qty 100

## 2012-09-10 MED ORDER — MENTHOL 3 MG MT LOZG: 1.0000 | LOZENGE | OROMUCOSAL | Status: DC | PRN

## 2012-09-10 MED ORDER — EPHEDRINE SULFATE 50 MG/ML IJ SOLN
INTRAMUSCULAR | Status: DC | PRN
  Administered 2012-09-10: 5 mg via INTRAVENOUS

## 2012-09-10 MED ORDER — METOCLOPRAMIDE HCL 5 MG/ML IJ SOLN: 5.0000 mg | Freq: Three times a day (TID) | INTRAMUSCULAR | Status: DC | PRN

## 2012-09-10 MED ORDER — METHOCARBAMOL 100 MG/ML IJ SOLN
500.0000 mg | Freq: Four times a day (QID) | INTRAVENOUS | Status: DC | PRN
  Filled 2012-09-10: qty 5

## 2012-09-10 MED ORDER — ONDANSETRON HCL 4 MG/2ML IJ SOLN: 4.0000 mg | Freq: Four times a day (QID) | INTRAMUSCULAR | Status: DC | PRN

## 2012-09-10 MED ORDER — PHENOL 1.4 % MT LIQD: 1.0000 | OROMUCOSAL | Status: DC | PRN

## 2012-09-10 MED ORDER — MIDAZOLAM HCL 5 MG/5ML IJ SOLN
INTRAMUSCULAR | Status: DC | PRN
  Administered 2012-09-10: 1 mg via INTRAVENOUS

## 2012-09-10 MED ORDER — ROCURONIUM BROMIDE 100 MG/10ML IV SOLN
INTRAVENOUS | Status: DC | PRN
  Administered 2012-09-10: 40 mg via INTRAVENOUS

## 2012-09-10 MED ORDER — ACETAMINOPHEN 10 MG/ML IV SOLN
INTRAVENOUS | Status: DC | PRN
  Administered 2012-09-10: 1000 mg via INTRAVENOUS

## 2012-09-10 MED ORDER — CARVEDILOL 6.25 MG PO TABS
6.2500 mg | ORAL_TABLET | Freq: Two times a day (BID) | ORAL | Status: DC
  Administered 2012-09-10 – 2012-09-13 (×6): 6.25 mg via ORAL
  Filled 2012-09-10 (×8): qty 1

## 2012-09-10 MED ORDER — METOCLOPRAMIDE HCL 10 MG PO TABS: 5.0000 mg | ORAL_TABLET | Freq: Three times a day (TID) | ORAL | Status: DC | PRN

## 2012-09-10 MED ORDER — LACTATED RINGERS IV SOLN
INTRAVENOUS | Status: DC | PRN
  Administered 2012-09-10 (×2): via INTRAVENOUS

## 2012-09-10 MED ORDER — OXYCODONE HCL 5 MG/5ML PO SOLN: 5.0000 mg | Freq: Once | ORAL | Status: DC | PRN

## 2012-09-10 MED ORDER — CLONIDINE HCL 0.2 MG PO TABS
0.2000 mg | ORAL_TABLET | Freq: Two times a day (BID) | ORAL | Status: DC
  Administered 2012-09-10 – 2012-09-12 (×4): 0.2 mg via ORAL
  Filled 2012-09-10 (×7): qty 1

## 2012-09-10 MED ORDER — SODIUM CHLORIDE 0.9 % IV SOLN: INTRAVENOUS | Status: DC

## 2012-09-10 MED ORDER — ALUM & MAG HYDROXIDE-SIMETH 200-200-20 MG/5ML PO SUSP: 30.0000 mL | ORAL | Status: DC | PRN

## 2012-09-10 MED ORDER — 0.9 % SODIUM CHLORIDE (POUR BTL) OPTIME
TOPICAL | Status: DC | PRN
  Administered 2012-09-10: 1000 mL

## 2012-09-10 MED ORDER — OXYCODONE HCL 5 MG PO TABS: 5.0000 mg | ORAL_TABLET | Freq: Once | ORAL | Status: DC | PRN

## 2012-09-10 MED ORDER — ACETAMINOPHEN 325 MG PO TABS: 650.0000 mg | ORAL_TABLET | Freq: Four times a day (QID) | ORAL | Status: DC | PRN

## 2012-09-10 MED ORDER — CEFAZOLIN SODIUM 1-5 GM-% IV SOLN
1.0000 g | Freq: Four times a day (QID) | INTRAVENOUS | Status: AC
Start: 2012-09-10 — End: 2012-09-10
  Administered 2012-09-10 (×2): 1 g via INTRAVENOUS
  Filled 2012-09-10 (×2): qty 50

## 2012-09-10 MED ORDER — ONDANSETRON HCL 4 MG PO TABS: 4.0000 mg | ORAL_TABLET | Freq: Four times a day (QID) | ORAL | Status: DC | PRN

## 2012-09-10 MED ORDER — FENTANYL CITRATE 0.05 MG/ML IJ SOLN
INTRAMUSCULAR | Status: DC | PRN
  Administered 2012-09-10 (×5): 50 ug via INTRAVENOUS

## 2012-09-10 MED ORDER — DIPHENHYDRAMINE HCL 12.5 MG/5ML PO ELIX: 12.5000 mg | ORAL_SOLUTION | ORAL | Status: DC | PRN

## 2012-09-10 MED ORDER — ONDANSETRON HCL 4 MG/2ML IJ SOLN
INTRAMUSCULAR | Status: DC | PRN
  Administered 2012-09-10: 4 mg via INTRAVENOUS

## 2012-09-10 MED ORDER — HYDROMORPHONE HCL PF 1 MG/ML IJ SOLN
0.2500 mg | INTRAMUSCULAR | Status: DC | PRN
  Administered 2012-09-10 (×3): 0.5 mg via INTRAVENOUS

## 2012-09-10 MED ORDER — LIDOCAINE HCL (CARDIAC) 20 MG/ML IV SOLN
INTRAVENOUS | Status: DC | PRN
  Administered 2012-09-10: 50 mg via INTRAVENOUS

## 2012-09-10 MED ORDER — PROPOFOL 10 MG/ML IV BOLUS
INTRAVENOUS | Status: DC | PRN
  Administered 2012-09-10: 120 mg via INTRAVENOUS

## 2012-09-10 MED ORDER — LISINOPRIL 40 MG PO TABS
40.0000 mg | ORAL_TABLET | Freq: Every day | ORAL | Status: DC
  Administered 2012-09-12: 40 mg via ORAL
  Filled 2012-09-10 (×3): qty 1

## 2012-09-10 MED ORDER — GLYCOPYRROLATE 0.2 MG/ML IJ SOLN
INTRAMUSCULAR | Status: DC | PRN
  Administered 2012-09-10: 0.4 mg via INTRAVENOUS

## 2012-09-10 MED ORDER — CARVEDILOL 6.25 MG PO TABS
6.2500 mg | ORAL_TABLET | Freq: Two times a day (BID) | ORAL | Status: DC
  Administered 2012-09-10: 6.25 mg via ORAL
  Filled 2012-09-10 (×4): qty 1

## 2012-09-10 MED ORDER — HYDROCODONE-ACETAMINOPHEN 5-325 MG PO TABS
1.0000 | ORAL_TABLET | ORAL | Status: DC | PRN
  Administered 2012-09-10 – 2012-09-13 (×6): 2 via ORAL
  Filled 2012-09-10 (×6): qty 2

## 2012-09-10 MED ORDER — HYDROMORPHONE HCL PF 1 MG/ML IJ SOLN
1.0000 mg | INTRAMUSCULAR | Status: DC | PRN
  Administered 2012-09-10 (×2): 1 mg via INTRAVENOUS
  Filled 2012-09-10 (×2): qty 1

## 2012-09-10 MED ORDER — FERROUS SULFATE 325 (65 FE) MG PO TABS
325.0000 mg | ORAL_TABLET | Freq: Three times a day (TID) | ORAL | Status: DC
  Administered 2012-09-10 – 2012-09-13 (×8): 325 mg via ORAL
  Filled 2012-09-10 (×11): qty 1

## 2012-09-10 MED ORDER — HYDROMORPHONE HCL PF 1 MG/ML IJ SOLN
INTRAMUSCULAR | Status: AC
  Filled 2012-09-10: qty 2

## 2012-09-10 SURGICAL SUPPLY — 47 items
BLADE SAW SAG 73X25 THK (BLADE) ×1
BLADE SAW SGTL 73X25 THK (BLADE) ×1 IMPLANT
BLADE SURG 10 STRL SS (BLADE) IMPLANT
BLADE SURG 21 STRL SS (BLADE) IMPLANT
BRUSH FEMORAL CANAL (MISCELLANEOUS) IMPLANT
CANISTER SUCTION 2500CC (MISCELLANEOUS) ×2 IMPLANT
CLOTH BEACON ORANGE TIMEOUT ST (SAFETY) ×2 IMPLANT
COVER BACK TABLE 24X17X13 BIG (DRAPES) IMPLANT
COVER SURGICAL LIGHT HANDLE (MISCELLANEOUS) ×2 IMPLANT
DRAPE INCISE IOBAN 85X60 (DRAPES) ×2 IMPLANT
DRAPE ORTHO SPLIT 77X108 STRL (DRAPES) ×4
DRAPE SURG ORHT 6 SPLT 77X108 (DRAPES) ×2 IMPLANT
DRAPE U-SHAPE 47X51 STRL (DRAPES) ×2 IMPLANT
DRSG MEPILEX BORDER 4X12 (GAUZE/BANDAGES/DRESSINGS) ×1 IMPLANT
DRSG MEPILEX BORDER 4X8 (GAUZE/BANDAGES/DRESSINGS) IMPLANT
DURAPREP 26ML APPLICATOR (WOUND CARE) ×2 IMPLANT
ELECT BLADE 6.5 EXT (BLADE) IMPLANT
ELECT CAUTERY BLADE 6.4 (BLADE) ×2 IMPLANT
ELECT REM PT RETURN 9FT ADLT (ELECTROSURGICAL) ×2
ELECTRODE REM PT RTRN 9FT ADLT (ELECTROSURGICAL) ×1 IMPLANT
GLOVE BIOGEL PI IND STRL 9 (GLOVE) ×1 IMPLANT
GLOVE BIOGEL PI INDICATOR 9 (GLOVE) ×1
GLOVE SURG ORTHO 9.0 STRL STRW (GLOVE) ×3 IMPLANT
GOWN PREVENTION PLUS XLARGE (GOWN DISPOSABLE) ×2 IMPLANT
GOWN SRG XL XLNG 56XLVL 4 (GOWN DISPOSABLE) ×1 IMPLANT
GOWN STRL NON-REIN XL XLG LVL4 (GOWN DISPOSABLE) ×2
HANDPIECE INTERPULSE COAX TIP (DISPOSABLE)
KIT BASIN OR (CUSTOM PROCEDURE TRAY) ×2 IMPLANT
KIT ROOM TURNOVER OR (KITS) ×2 IMPLANT
MANIFOLD NEPTUNE II (INSTRUMENTS) ×1 IMPLANT
NS IRRIG 1000ML POUR BTL (IV SOLUTION) ×2 IMPLANT
PACK TOTAL JOINT (CUSTOM PROCEDURE TRAY) ×2 IMPLANT
PAD ARMBOARD 7.5X6 YLW CONV (MISCELLANEOUS) ×4 IMPLANT
PRESSURIZER FEMORAL UNIV (MISCELLANEOUS) IMPLANT
SET HNDPC FAN SPRY TIP SCT (DISPOSABLE) IMPLANT
STAPLER VISISTAT 35W (STAPLE) ×2 IMPLANT
SUT ETHIBOND NAB CT1 #1 30IN (SUTURE) ×2 IMPLANT
SUT VIC AB 0 CT1 27 (SUTURE) ×4
SUT VIC AB 0 CT1 27XBRD ANBCTR (SUTURE) ×2 IMPLANT
SUT VIC AB 1 CTX 36 (SUTURE) ×2
SUT VIC AB 1 CTX36XBRD ANBCTR (SUTURE) IMPLANT
SUT VIC AB 2-0 CTB1 (SUTURE) ×4 IMPLANT
TOWEL OR 17X24 6PK STRL BLUE (TOWEL DISPOSABLE) ×2 IMPLANT
TOWEL OR 17X26 10 PK STRL BLUE (TOWEL DISPOSABLE) ×2 IMPLANT
TOWER CARTRIDGE SMART MIX (DISPOSABLE) IMPLANT
TRAY FOLEY CATH 14FR (SET/KITS/TRAYS/PACK) IMPLANT
WATER STERILE IRR 1000ML POUR (IV SOLUTION) ×6 IMPLANT

## 2012-09-10 NOTE — Anesthesia Postprocedure Evaluation (Signed)
Anesthesia Post Note  Patient: [name]  Procedure(s) Performed: Procedure(s) (LRB): TOTAL HIP ARTHROPLASTY (Right)  Anesthesia type: General  Patient location: PACU  Post pain: Pain level controlled and Adequate analgesia  Post assessment: Post-op Vital signs reviewed, Patient's Cardiovascular Status Stable, Respiratory Function Stable, Patent Airway and Pain level controlled  Last Vitals:  Filed Vitals:   09/10/12 0704  BP: 118/63  Pulse: 72  Temp: 36.7 C  Resp: 18    Post vital signs: Reviewed and stable  Level of consciousness: awake, alert  and oriented  Complications: No apparent anesthesia complications

## 2012-09-10 NOTE — Transfer of Care (Signed)
Immediate Anesthesia Transfer of Care Note  Patient: [name]  Procedure(s) Performed: Procedure(s) with comments: TOTAL HIP ARTHROPLASTY (Right) - Right Total Hip Arthroplasty  Patient Location: PACU  Anesthesia Type:General  Level of Consciousness: awake, alert  and confused  Airway & Oxygen Therapy: Patient connected to face mask  Post-op Assessment: Report given to PACU RN  Post vital signs: stable  Complications: No apparent anesthesia complications

## 2012-09-10 NOTE — H&P (Signed)
TOTAL HIP ADMISSION H&P  Patient is admitted for right total hip arthroplasty.  Subjective:  Chief Complaint: right hip pain  HPI: [name], 72 y.o. female, has a history of pain and functional disability in the right hip(s) due to arthritis and patient has failed non-surgical conservative treatments for greater than 12 weeks to include NSAID's and/or analgesics, flexibility and strengthening excercises, use of assistive devices and activity modification.  Onset of symptoms was gradual starting 8 years ago with gradually worsening course since that time.The patient noted no past surgery on the right hip(s).  Patient currently rates pain in the right hip at 8 out of 10 with activity. Patient has worsening of pain with activity and weight bearing, trendelenberg gait, pain that interfers with activities of daily living and pain with passive range of motion. Patient has evidence of subchondral cysts, subchondral sclerosis, periarticular osteophytes and joint space narrowing by imaging studies. This condition presents safety issues increasing the risk of falls. This patient has had Osteoarthritis right hip.  There is no current active infection.  Patient Active Problem List   Diagnosis Date Noted  . Bruit 07/10/2012  . Leukocytosis 09/19/2011  . Encephalopathy 09/19/2011  . AAA 01/19/2009  . ILIAC ARTERY ANEURYSM 01/19/2009  . HYPERLIPIDEMIA-MIXED 01/14/2009  . TOBACCO ABUSE 01/14/2009  . HYPERTENSION, UNSPECIFIED 01/14/2009  . CAD, UNSPECIFIED SITE 01/14/2009  . CEREBROVASCULAR DISEASE 01/14/2009  . PERIPHERAL VASCULAR DISEASE 01/14/2009  . CHRONIC OBSTRUCTIVE PULMONARY DISEASE 01/14/2009  . CHEST PAIN-UNSPECIFIED 01/14/2009   Past Medical History  Diagnosis Date  . CAD (coronary artery disease)   . PVD (peripheral vascular disease)   . HTN (hypertension)   . Hyperlipidemia   . Cerebrovascular disease   . Arthritis     Past Surgical History  Procedure Laterality Date  . Knee  surgery    . Abdominal hysterectomy    . Coronary angioplasty      3 stents placed    Prescriptions prior to admission  Medication Sig Dispense Refill  . amLODipine (NORVASC) 10 MG tablet Take 10 mg by mouth daily.      [name] aspirin 81 MG tablet Take 81 mg by mouth daily.        [name] atorvastatin (LIPITOR) 40 MG tablet Take 40 mg by mouth daily.      . carvedilol (COREG) 6.25 MG tablet Take 6.25 mg by mouth 2 (two) times daily with a meal.      . cloNIDine (CATAPRES) 0.2 MG tablet Take 0.2 mg by mouth 2 (two) times daily.      . diclofenac (VOLTAREN) 75 MG EC tablet Take 75 mg by mouth 2 (two) times daily.       . Glucosamine-Chondroit-Vit C-Mn (GLUCOSAMINE-CHONDROITIN) CAPS Take 1 capsule by mouth daily.      . hydrochlorothiazide (MICROZIDE) 12.5 MG capsule Take 12.5 mg by mouth daily.      [name] lisinopril (PRINIVIL,ZESTRIL) 40 MG tablet Take 40 mg by mouth daily.      . Multiple Vitamin (MULITIVITAMIN WITH MINERALS) TABS Take 1 tablet by mouth daily.       No Known Allergies  History  Substance Use Topics  . Smoking status: Current Every Day Smoker -- 0.25 packs/day for 50 years    Types: Cigarettes  . Smokeless tobacco: Never Used  . Alcohol Use: No    Family History  Problem Relation Age of Onset  . Breast cancer Mother   . Leukemia Father   . Stroke Sister   . Bone  cancer       Review of Systems  All other systems reviewed and are negative.    Objective:  Physical Exam  Vital signs in last 24 hours:    Labs:   Estimated body mass index is 25.66 kg/(m^2) as calculated from the following:   Height as of 07/10/12: 5\' 3"  (1.6 m).   Weight as of 07/10/12: 65.681 kg (144 lb 12.8 oz).   Imaging Review Plain radiographs demonstrate moderate degenerative joint disease of the right hip(s). The bone quality appears to be adequate for age and reported activity level.  Assessment/Plan:  End stage arthritis, right hip(s)  The patient history, physical examination, clinical  judgement of the provider and imaging studies are consistent with end stage degenerative joint disease of the right hip(s) and total hip arthroplasty is deemed medically necessary. The treatment options including medical management, injection therapy, arthroscopy and arthroplasty were discussed at length. The risks and benefits of total hip arthroplasty were presented and reviewed. The risks due to aseptic loosening, infection, stiffness, dislocation/subluxation,  thromboembolic complications and other imponderables were discussed.  The patient acknowledged the explanation, agreed to proceed with the plan and consent was signed. Patient is being admitted for inpatient treatment for surgery, pain control, PT, OT, prophylactic antibiotics, VTE prophylaxis, progressive ambulation and ADL's and discharge planning.The patient is planning to be discharged home with home health services

## 2012-09-10 NOTE — Progress Notes (Signed)
UR COMPLETED  

## 2012-09-10 NOTE — Evaluation (Signed)
Physical Therapy Evaluation Patient Details Name: [name] MRN: [id number] DOB: [birth year] Today's Date: 09/10/2012 Time: 4098-1191 PT Time Calculation (min): 31 min  PT Assessment / Plan / Recommendation Clinical Impression  pt rpesents with R THA.  pt very motivated for mobility.  Anticipate good progress.      PT Assessment  Patient needs continued PT services    Follow Up Recommendations  SNF    Does the patient have the potential to tolerate intense rehabilitation      Barriers to Discharge None      Equipment Recommendations  Rolling walker with 5" wheels    Recommendations for Other Services     Frequency 7X/week    Precautions / Restrictions Precautions Precautions: Posterior Hip Precaution Booklet Issued: Yes (comment) Precaution Comments: hip precautions provided Restrictions Weight Bearing Restrictions: Yes   Pertinent Vitals/Pain Pt indicates pain is tolerable, but did not rate.        Mobility  Bed Mobility Bed Mobility: Supine to Sit;Sitting - Scoot to Edge of Bed Supine to Sit: 4: Min guard;HOB flat (mod v/c ) Sitting - Scoot to Edge of Bed: 4: Min guard Details for Bed Mobility Assistance: pt with encouragement to progress to EOB Transfers Sit to Stand: 4: Min assist;With upper extremity assist;From bed Stand to Sit: 4: Min assist;With upper extremity assist;To chair/3-in-1 Details for Transfer Assistance: Min v/c for hand placment on RW and Mod (A) to help descend to chair Ambulation/Gait Ambulation/Gait Assistance: 4: Min assist Ambulation Distance (Feet): 40 Feet Assistive device: Rolling walker Ambulation/Gait Assistance Details: cues for positioning in RW, attending to task, use of RW.   Gait Pattern: Step-through pattern;Decreased stride length Stairs: No Wheelchair Mobility Wheelchair Mobility: No    Exercises     PT Diagnosis: Abnormality of gait;Acute pain  PT Problem List: Decreased strength;Decreased activity  tolerance;Decreased balance;Decreased mobility;Decreased cognition;Decreased knowledge of use of DME;Decreased knowledge of precautions;Pain PT Treatment Interventions: DME instruction;Gait training;Stair training;Functional mobility training;Therapeutic activities;Therapeutic exercise;Balance training;Patient/family education   PT Goals Acute Rehab PT Goals PT Goal Formulation: With patient Time For Goal Achievement: 09/17/12 Potential to Achieve Goals: Good Pt will go Supine/Side to Sit: with supervision PT Goal: Supine/Side to Sit - Progress: Goal set today Pt will go Sit to Supine/Side: with supervision PT Goal: Sit to Supine/Side - Progress: Goal set today Pt will go Sit to Stand: with supervision PT Goal: Sit to Stand - Progress: Goal set today Pt will go Stand to Sit: with supervision PT Goal: Stand to Sit - Progress: Goal set today Pt will Ambulate: >150 feet;with supervision;with rolling walker PT Goal: Ambulate - Progress: Goal set today Pt will Go Up / Down Stairs: 1-2 stairs;with min assist;with least restrictive assistive device PT Goal: Up/Down Stairs - Progress: Goal set today Pt will Perform Home Exercise Program: with supervision, verbal cues required/provided PT Goal: Perform Home Exercise Program - Progress: Goal set today Additional Goals Additional Goal #1: pt will verbalize and follow posterior hip precautions.   PT Goal: Additional Goal #1 - Progress: Goal set today  Visit Information  Last PT Received On: 09/10/12 Assistance Needed: +1 PT/OT Co-Evaluation/Treatment: Yes    Subjective Data  Subjective: Now what are we doing again?   Patient Stated Goal: Move normal   Prior Functioning  Home Living Lives With: Alone Available Help at Discharge: Other (Comment) (SNF) Prior Function Level of Independence: Independent Able to Take Stairs?: Yes Driving: Yes Vocation: Retired Musician: No difficulties Dominant Hand: Right  Cognition  Cognition Overall Cognitive Status: Appears within functional limits for tasks assessed/performed Arousal/Alertness: Awake/alert Orientation Level: Appears intact for tasks assessed Behavior During Session: [facility] for tasks performed Cognition - Other Comments: pt only having difficulty with STM due to meds.      Extremity/Trunk Assessment Right Upper Extremity Assessment RUE ROM/Strength/Tone: Within functional levels RUE Sensation: WFL - Light Touch RUE Coordination: WFL - gross/fine motor Left Upper Extremity Assessment LUE ROM/Strength/Tone: Within functional levels LUE Sensation: WFL - Light Touch LUE Coordination: WFL - gross/fine motor Right Lower Extremity Assessment RLE ROM/Strength/Tone: Deficits RLE ROM/Strength/Tone Deficits: Post-op pain limiting strength and ROM.   RLE Sensation: WFL - Light Touch Left Lower Extremity Assessment LLE ROM/Strength/Tone: WFL for tasks assessed LLE Sensation: WFL - Light Touch Trunk Assessment Trunk Assessment: Normal   Balance Balance Balance Assessed: No  End of Session PT - End of Session Equipment Utilized During Treatment: Gait belt Activity Tolerance: Patient tolerated treatment well Patient left: in chair;with call bell/phone within reach;with family/visitor present Nurse Communication: Mobility status  GP     Sunny Schlein, [place] [phone] 09/10/2012, 2:32 PM

## 2012-09-10 NOTE — Anesthesia Procedure Notes (Signed)
Procedure Name: Intubation Date/Time: 09/10/2012 8:37 AM Performed by: Ellin Goodie Pre-anesthesia Checklist: Patient identified, Emergency Drugs available, Suction available, Patient being monitored and Timeout performed Patient Re-evaluated:Patient Re-evaluated prior to inductionOxygen Delivery Method: Circle system utilized Preoxygenation: Pre-oxygenation with 100% oxygen Intubation Type: IV induction Ventilation: Mask ventilation without difficulty Laryngoscope Size: Mac and 3 Grade View: Grade I Tube type: Oral Tube size: 7.5 mm Number of attempts: 1 Airway Equipment and Method: Stylet Placement Confirmation: ETT inserted through vocal cords under direct vision,  positive ETCO2 and breath sounds checked- equal and bilateral Secured at: 23 cm Tube secured with: Tape Dental Injury: Teeth and Oropharynx as per pre-operative assessment

## 2012-09-10 NOTE — Anesthesia Preprocedure Evaluation (Addendum)
Anesthesia Evaluation  Patient identified by MRN, date of birth, ID band Patient awake    Reviewed: Allergy & Precautions, H&P , NPO status , Patient's Chart, lab work & pertinent test results, reviewed documented beta blocker date and time   Airway Mallampati: II TM Distance: >3 FB Neck ROM: full    Dental  (+) Partial Upper and Dental Advisory Given   Pulmonary COPD COPD inhaler, Current Smoker,    + wheezing      Cardiovascular hypertension, Pt. on medications + CAD and + Peripheral Vascular Disease Rhythm:Regular Rate:Normal     Neuro/Psych    GI/Hepatic   Endo/Other    Renal/GU      Musculoskeletal  (+) Arthritis -,   Abdominal (+)  Abdomen: soft. Bowel sounds: normal.  Peds  Hematology   Anesthesia Other Findings   Reproductive/Obstetrics                         Anesthesia Physical Anesthesia Plan  ASA: III  Anesthesia Plan: General   Post-op Pain Management:    Induction: Intravenous  Airway Management Planned: Oral ETT  Additional Equipment:   Intra-op Plan:   Post-operative Plan: Extubation in OR  Informed Consent: I have reviewed the patients History and Physical, chart, labs and discussed the procedure including the risks, benefits and alternatives for the proposed anesthesia with the patient or authorized representative who has indicated his/her understanding and acceptance.     Plan Discussed with: CRNA and Surgeon  Anesthesia Plan Comments:         Anesthesia Quick Evaluation

## 2012-09-10 NOTE — Evaluation (Signed)
Occupational Therapy Evaluation Patient Details Name: [name] MRN: [id number] DOB: 19-Nov-1940 Today's Date: 09/10/2012 Time: 1339-1410 OT Time Calculation (min): 31 min  OT Assessment / Plan / Recommendation Clinical Impression  72 yo female s/p Rt THR with posterior precautions WBAT that could benfit from skilled OT acutely. Recommend SNF for d/c    OT Assessment  Patient needs continued OT Services    Follow Up Recommendations  SNF    Barriers to Discharge      Equipment Recommendations  None recommended by OT    Recommendations for Other Services    Frequency  Min 2X/week    Precautions / Restrictions Precautions Precautions: Posterior Hip Precaution Booklet Issued: Yes (comment) Precaution Comments: hip precautions provided Restrictions Weight Bearing Restrictions: Yes   Pertinent Vitals/Pain     ADL  Eating/Feeding: Set up Where Assessed - Eating/Feeding: Chair Grooming: Wash/dry hands;Minimal assistance Where Assessed - Grooming: Supported standing Lower Body Dressing: +1 Total assistance Where Assessed - Lower Body Dressing: Unsupported sitting Toilet Transfer: Moderate assistance Toilet Transfer Method: Sit to stand Toilet Transfer Equipment: Raised toilet seat with arms (or 3-in-1 over toilet) Equipment Used: Gait belt;Rolling walker Transfers/Ambulation Related to ADLs: Pt completed sit <>Stand Min (A)  ADL Comments: Pt educated on hip precautions with poor recall. Pt referring to handout but no recall for the reason to maintain precautions. pt pleasant and laughing throughout session. Pt needs mod v/c throughout session for precautions. pt attempting to cross BIL LE so blanket placed to prevent. Son present in room and educated on SNF search.  CM notified of Son Dava Najjar to assist with placement    OT Diagnosis: Generalized weakness;Acute pain  OT Problem List: Decreased strength;Decreased activity tolerance;Impaired balance (sitting and/or  standing);Decreased safety awareness;Decreased knowledge of use of DME or AE;Decreased knowledge of precautions;Pain OT Treatment Interventions: Self-care/ADL training;Therapeutic exercise;DME and/or AE instruction;Therapeutic activities;Patient/family education;Balance training   OT Goals Acute Rehab OT Goals OT Goal Formulation: With patient Time For Goal Achievement: 09/24/12 Potential to Achieve Goals: Good ADL Goals Pt Will Perform Lower Body Bathing: with supervision;Sitting, chair;with adaptive equipment ADL Goal: Lower Body Bathing - Progress: Goal set today Pt Will Perform Lower Body Dressing: with supervision;Sitting, chair;with adaptive equipment ADL Goal: Lower Body Dressing - Progress: Goal set today Pt Will Transfer to Toilet: with supervision;Ambulation;3-in-1 ADL Goal: Toilet Transfer - Progress: Goal set today Miscellaneous OT Goals Miscellaneous OT Goal #1: Pt will complete bed mobility MOD I with HOB flat OT Goal: Miscellaneous Goal #1 - Progress: Goal set today  Visit Information  Last OT Received On: 09/10/12 Assistance Needed: +1 PT/OT Co-Evaluation/Treatment: Yes    Subjective Data  Subjective: "you could be a rap artist" Patient Stated Goal: to go to snf   Prior Functioning     Home Living Lives With: Alone Available Help at Discharge: Other (Comment) (SNF) Prior Function Level of Independence: Independent Able to Take Stairs?: Yes Driving: Yes Vocation: Retired Musician: No difficulties Dominant Hand: Right         Vision/Perception Vision - History Baseline Vision: Wears glasses all the time Patient Visual Report: No change from baseline   Cognition  Cognition Overall Cognitive Status: Appears within functional limits for tasks assessed/performed Arousal/Alertness: Awake/alert Orientation Level: Appears intact for tasks assessed Behavior During Session: [facility] for tasks performed    Extremity/Trunk Assessment Right  Upper Extremity Assessment RUE ROM/Strength/Tone: Within functional levels RUE Sensation: WFL - Light Touch RUE Coordination: WFL - gross/fine motor Left Upper Extremity Assessment LUE  ROM/Strength/Tone: Within functional levels LUE Sensation: WFL - Light Touch LUE Coordination: WFL - gross/fine motor Trunk Assessment Trunk Assessment: Normal     Mobility Bed Mobility Bed Mobility: Supine to Sit;Sitting - Scoot to Edge of Bed Supine to Sit: 4: Min guard;HOB flat (mod v/c ) Sitting - Scoot to Edge of Bed: 4: Min guard Details for Bed Mobility Assistance: pt with encouragement to progress to EOB Transfers Transfers: Sit to Stand;Stand to Sit Sit to Stand: 4: Min assist;With upper extremity assist;From bed Stand to Sit: 4: Min assist;With upper extremity assist;To chair/3-in-1 Details for Transfer Assistance: Min v/c for hand placment on RW and Mod (A) to help descend to chair     Exercise     Balance     End of Session OT - End of Session Activity Tolerance: Patient tolerated treatment well Patient left: in bed;with call bell/phone within reach Nurse Communication: Mobility status;Precautions  GO     Lucile Shutters 09/10/2012, 2:26 PM Pager: [phone]

## 2012-09-10 NOTE — Op Note (Signed)
OPERATIVE REPORT  DATE OF SURGERY: 09/10/2012  PATIENT:  [name],  72 y.o. female  PRE-OPERATIVE DIAGNOSIS:  Osteoarthritis Right Hip  POST-OPERATIVE DIAGNOSIS:  Osteoarthritis Right Hip  PROCEDURE:  Procedure(s): TOTAL HIP ARTHROPLASTY Zimmer components. 50 mm acetabulum. 0 polyethylene liner. Size 11 stem. 32 mm head. Anteverted femoral neck.   SURGEON:  Surgeon(s): Nadara Mustard, MD  ANESTHESIA:   general  EBL:  Minimal ML  SPECIMEN:  No Specimen  TOURNIQUET:  * No tourniquets in log *  PROCEDURE DETAILS: Patient is a 72 year old woman with bone-on-bone collapse of her right hip. She has failed prolonged conservative therapy pain with activities of daily living and presents at this time for total hip arthroplasty. Risks and benefits were discussed including infection neurovascular injury persistent pain DVT pulmonary embolus dislocation. Patient states she understands and wished to proceed at this time. Description of procedure patient was brought to the operating room and underwent a general anesthetic. After adequate levels of anesthesia were obtained patient was placed in the left lateral decubitus position with the right side up and the right lower extremity was prepped using DuraPrep draped into a sterile field an [place] was used to cover all exposed skin. A posterior lateral incision was made this carried down to the tensor fascia lata which was split. The piriformis short external rotators and capsule were incised longitudinally off the femoral neck and retracted with Ethibond suture. The femoral neck cut was made 1 cm proximal to the calcar. The acetabulum was sequentially broached and reamed up to 50 mm and a 50 mm acetabular cup was inserted abducted 45 anteverted 20. The polyethylene liner was placed. The femoral canal was then sequentially broached to a size 11 the size 11 stem was placed and trial necks were then tried. Patient had excellent stability with a  full abduction flexion to 100 and internally rotated 70 and the hip was stable there was a negative shuck test. The wound is irrigated throughout the case the final head and neck were inserted again placed the range of motion the hip was stable. The piriformis short external rotators and capsule were reapproximated using Ethibond suture. The tensor fascia lata was closed using #1 Vicryl. Subcutaneous was closed using 0 Vicryl. The skin was closed using staples. A Mepilex dressing was applied patient was extubated taken to the PACU in stable condition.  PLAN OF CARE: Admit to inpatient   PATIENT DISPOSITION:  PACU - hemodynamically stable.   Nadara Mustard, MD 09/10/2012 9:45 AM

## 2012-09-10 NOTE — Preoperative (Signed)
Beta Blockers   Reason not to administer Beta Blockers:Not Applicable 

## 2012-09-11 ENCOUNTER — Encounter (HOSPITAL_COMMUNITY): Payer: Self-pay | Admitting: General Practice

## 2012-09-11 LAB — BASIC METABOLIC PANEL
BUN: 11 mg/dL (ref 6–23)
Chloride: 98 mEq/L (ref 96–112)
GFR calc Af Amer: 79 mL/min — ABNORMAL LOW (ref >90)
Glucose, Bld: 137 mg/dL — ABNORMAL HIGH (ref 70–99)
Potassium: 3.8 mEq/L (ref 3.5–5.1)
Sodium: 135 mEq/L (ref 135–145)

## 2012-09-11 LAB — CBC
HCT: 36.8 % (ref 36.0–46.0)
Hemoglobin: 12.8 g/dL (ref 12.0–15.0)
RDW: 12.8 % (ref 11.5–15.5)
WBC: 10.3 10*3/uL (ref 4.0–10.5)

## 2012-09-11 NOTE — Progress Notes (Signed)
Patient ID: [name], female   DOB: 1940-12-20, 72 y.o.   MRN: [id number] Postoperative day 1 total hip arthroplasty. Labs are pending. Patient is sitting up in a chair this morning. Physical therapy progressive ambulation weightbearing as tolerated.

## 2012-09-11 NOTE — Progress Notes (Signed)
Orthopedic Tech Progress Note Patient Details:  [name] [birth year] [id number] Applied overhead frame to bed.     Jennye Moccasin 09/11/2012, 5:46 PM

## 2012-09-11 NOTE — Progress Notes (Signed)
CARE MANAGEMENT NOTE 09/11/2012  Patient:  [name], [name]   Account Number:  [id number]  Date Initiated:  09/11/2012  Documentation initiated by:  Vance Peper  Subjective/Objective Assessment:   72 yr old female s/p right total hip arthroplasty.     Action/Plan:   Patient will needSNF for shortterm rehab. Social Worker will follow.   Anticipated DC Date:  09/13/2012   Anticipated DC Plan:  SKILLED NURSING FACILITY  In-house referral  Clinical Social Worker      DC Planning Services  CM consult      Choice offered to / List presented to:          [facility] arranged  NA      Status of service:  Completed, signed off Medicare Important Message given?   (If response is "NO", the following Medicare IM given date fields will be blank) Date Medicare IM given:   Date Additional Medicare IM given:    Discharge Disposition:  SKILLED NURSING FACILITY  Per UR Regulation:    If discussed at Long Length of Stay Meetings, dates discussed:    Comments:

## 2012-09-11 NOTE — Progress Notes (Signed)
Physical Therapy Treatment Patient Details Name: [name] MRN: [id number] DOB: 1940-09-11 Today's Date: 09/11/2012 Time: 2440-1027 PT Time Calculation (min): 47 min  PT Assessment / Plan / Recommendation Comments on Treatment Session  pt rpesents with R THA.  pt continues to have STM difficulties and needs frequent reminders for safety.      Follow Up Recommendations  SNF     Does the patient have the potential to tolerate intense rehabilitation     Barriers to Discharge        Equipment Recommendations  Rolling walker with 5" wheels    Recommendations for Other Services    Frequency 7X/week   Plan Discharge plan remains appropriate;Frequency remains appropriate    Precautions / Restrictions Precautions Precautions: Posterior Hip Precaution Booklet Issued: Yes (comment) Precaution Comments: hip precautions provided Restrictions Weight Bearing Restrictions: Yes RLE Weight Bearing: Weight bearing as tolerated   Pertinent Vitals/Pain Indicates pain with mobility, but begins laughing when PT asks to rate pain.      Mobility  Bed Mobility Bed Mobility: Supine to Sit;Sitting - Scoot to Edge of Bed Supine to Sit: 4: Min assist;With rails;HOB flat Sitting - Scoot to [place] of Bed: 4: Min assist Details for Bed Mobility Assistance: A with R LE today due to increased pain.   Transfers Transfers: Sit to Stand;Stand to Sit Sit to Stand: 4: Min assist;With upper extremity assist;From bed;From chair/3-in-1 Stand to Sit: 4: Min assist;With upper extremity assist;To chair/3-in-1;With armrests Details for Transfer Assistance: cues for UE use, positioning of LEs, attending to task.  pt required increased safety cueing in bathroom.   Ambulation/Gait Ambulation/Gait Assistance: 4: Min assist Ambulation Distance (Feet): 80 Feet Assistive device: Rolling walker Ambulation/Gait Assistance Details: cues for RW use, safety, hip precautions, positioning in RW.   Gait Pattern:  Step-through pattern;Decreased stride length Stairs: No Wheelchair Mobility Wheelchair Mobility: No    Exercises     PT Diagnosis:    PT Problem List:   PT Treatment Interventions:     PT Goals Acute Rehab PT Goals Time For Goal Achievement: 09/17/12 Potential to Achieve Goals: Good PT Goal: Supine/Side to Sit - Progress: Progressing toward goal PT Goal: Sit to Stand - Progress: Progressing toward goal PT Goal: Stand to Sit - Progress: Progressing toward goal PT Goal: Ambulate - Progress: Progressing toward goal Additional Goals PT Goal: Additional Goal #1 - Progress: Progressing toward goal  Visit Information  Last PT Received On: 09/11/12 Assistance Needed: +1    Subjective Data  Subjective: I snort when I laugh.     Cognition  Cognition Overall Cognitive Status: Appears within functional limits for tasks assessed/performed Arousal/Alertness: Awake/alert Orientation Level: Appears intact for tasks assessed Behavior During Session: [facility] for tasks performed Cognition - Other Comments: pt only having difficulty with STM due to meds.      Balance  Balance Balance Assessed: No  End of Session PT - End of Session Equipment Utilized During Treatment: Gait belt Activity Tolerance: Patient tolerated treatment well Patient left: in chair;with call bell/phone within reach Nurse Communication: Mobility status   GP     Sunny Schlein, [place] [phone] 09/11/2012, 11:55 AM

## 2012-09-12 LAB — CBC
HCT: 34.5 % — ABNORMAL LOW (ref 36.0–46.0)
Hemoglobin: 12 g/dL (ref 12.0–15.0)
MCH: 32.3 pg (ref 26.0–34.0)
MCHC: 34.8 g/dL (ref 30.0–36.0)
MCV: 93 fL (ref 78.0–100.0)
Platelets: 235 K/uL (ref 150–400)
RBC: 3.71 MIL/uL — ABNORMAL LOW (ref 3.87–5.11)
RDW: 12.7 % (ref 11.5–15.5)
WBC: 9.1 K/uL (ref 4.0–10.5)

## 2012-09-12 LAB — BASIC METABOLIC PANEL
BUN: 9 mg/dL (ref 6–23)
Creatinine, Ser: 0.67 mg/dL (ref 0.50–1.10)
GFR calc Af Amer: >90 mL/min (ref >90)
GFR calc non Af Amer: 86 mL/min — ABNORMAL LOW (ref >90)
Potassium: 3.6 mEq/L (ref 3.5–5.1)

## 2012-09-12 MED ORDER — ASPIRIN EC 325 MG PO TBEC: 325.0000 mg | DELAYED_RELEASE_TABLET | Freq: Every day | ORAL | Status: DC

## 2012-09-12 MED ORDER — HYDROCODONE-ACETAMINOPHEN 7.5-325 MG PO TABS: 1.0000 | ORAL_TABLET | ORAL | Status: DC | PRN

## 2012-09-12 MED ORDER — TRAMADOL HCL 50 MG PO TABS: 50.0000 mg | ORAL_TABLET | Freq: Four times a day (QID) | ORAL | Status: DC | PRN

## 2012-09-12 NOTE — Progress Notes (Signed)
Physical Therapy Treatment Patient Details Name: IZZIE GEERS MRN: [id number] DOB: 1941-05-28 Today's Date: 09/12/2012 Time: 4098-1191 PT Time Calculation (min): 24 min  PT Assessment / Plan / Recommendation Comments on Treatment Session  pt presents with R THA.  pt continues to have some cognitive deficits since surgery, though memory seems a little better.  pt able to recall her room number and find her room without A.  pt will continue to need SNF at D/C for safety.      Follow Up Recommendations  SNF     Does the patient have the potential to tolerate intense rehabilitation     Barriers to Discharge        Equipment Recommendations  Rolling walker with 5" wheels    Recommendations for Other Services    Frequency 7X/week   Plan Discharge plan remains appropriate;Frequency remains appropriate    Precautions / Restrictions Precautions Precautions: Posterior Hip Precaution Booklet Issued: Yes (comment) Precaution Comments: pt unable to verbalize any hip precautions.  Reviewed precautions and provided new handout.  Lengthy discussion on importance of precautions.   Restrictions Weight Bearing Restrictions: Yes RLE Weight Bearing: Weight bearing as tolerated   Pertinent Vitals/Pain Indicates pain is minimal.      Mobility  Bed Mobility Bed Mobility: Not assessed Transfers Transfers: Stand to Sit Stand to Sit: 5: Supervision;With upper extremity assist;To chair/3-in-1;With armrests Details for Transfer Assistance: pt standing in room with Nsg Tech on arrival.  pt required cueing for getting closer to chair prior to sitting.   Ambulation/Gait Ambulation/Gait Assistance: 4: Min guard Ambulation Distance (Feet): 250 Feet Assistive device: Rolling walker Ambulation/Gait Assistance Details: cues for positioning in RW, upright posture, attending to task, safety with turns.   Gait Pattern: Step-through pattern;Decreased stride length Stairs: No Wheelchair  Mobility Wheelchair Mobility: No    Exercises     PT Diagnosis:    PT Problem List:   PT Treatment Interventions:     PT Goals Acute Rehab PT Goals Time For Goal Achievement: 09/17/12 Potential to Achieve Goals: Good PT Goal: Stand to Sit - Progress: Progressing toward goal PT Goal: Ambulate - Progress: Progressing toward goal Additional Goals PT Goal: Additional Goal #1 - Progress: Progressing toward goal  Visit Information  Last PT Received On: 09/12/12 Assistance Needed: +1    Subjective Data  Subjective: That's not a duck.  -pt thought she saw a duck under the bed.     Cognition  Cognition Overall Cognitive Status: Appears within functional limits for tasks assessed/performed Arousal/Alertness: Awake/alert Orientation Level: Appears intact for tasks assessed Behavior During Session: [facility] for tasks performed Cognition - Other Comments: pt only having difficulty with STM due to meds, though memory seems to be improved today.  Upon entering room pt thought she saw a duck under the bed and was indicating the red bed lock under foot of bed.      Balance  Balance Balance Assessed: Yes Static Standing Balance Static Standing - Balance Support: During functional activity Static Standing - Level of Assistance: 5: Stand by assistance Static Standing - Comment/# of Minutes: pt able to maintain standing balance with unilateral UE support to look under bed and realize there wasn't a duck under her bed.    End of Session PT - End of Session Equipment Utilized During Treatment: Gait belt Activity Tolerance: Patient tolerated treatment well Patient left: in chair;with call bell/phone within reach Nurse Communication: Mobility status   GP     Donzel Romack, Alison Murray, [place] [phone]  09/12/2012, 12:17 PM

## 2012-09-12 NOTE — Progress Notes (Signed)
Patient ID: [name], female   DOB: 11/24/1940, 72 y.o.   MRN: [id number] Hemoglobin stable. Slow with physical therapy. Plan for discharge to short-term skilled nursing. Anticipate discharge on Saturday or Monday. Discharge summary completed.

## 2012-09-12 NOTE — Clinical Social Work Psychosocial (Signed)
Clinical Social Work Department  BRIEF PSYCHOSOCIAL ASSESSMENT  Patient: [name] Account Number: [id number]  Admit date: 09/10/12 Clinical Social Worker Sabino Niemann, MSW Date/Time: 09/12/2012 Referred by: Physician Date Referred: 07/10/13 Referred for   SNF Placement   Other Referral:  Interview type: Patient  Other interview type:PSYCHOSOCIAL DATA  Living Status: Alone Admitted from facility:  Level of care:  Primary support name: kyle white  Primary support relationship to patient: son Degree of support available:  Strong and vested  CURRENT CONCERNS  Current Concerns   Post-Acute Placement   Other Concerns:  SOCIAL WORK ASSESSMENT / PLAN  CSW met with pt re: PT recommendation for SNF.   Pt lives alone  CSW explained placement process and answered questions.   Pt reports [facility]  as her preference    CSW completed FL2 and initiatedSNF search.     Assessment/plan status: Information/Referral to [facility]  Other assessment/ plan:  Information/referral to community resources:  SNF   PTAR   PATIENT'S/FAMILY'S RESPONSE TO PLAN OF CARE:  Pt  reports she is agreeable to ST SNF in order to increase strength and independence with mobility prior to return home  Pt verbalized understanding of placement process and appreciation for CSW assist.   Sabino Niemann, MSW [phone] Late Entry

## 2012-09-12 NOTE — Clinical Social Work Placement (Signed)
Clinical Social Work Department  CLINICAL SOCIAL WORK PLACEMENT NOTE  07/06/2012  Patient: [name] Account Number:  [id number] Admit date: 09/10/12 Clinical Social Worker: Sabino Niemann LCSWA Date/time:09/10/12 4:30AM  Clinical Social Work is seeking post-discharge placement for this patient at the following level of care: SKILLED NURSING (*CSW will update this form in Epic as items are completed)  09/10/12 Patient/family provided with [facility] Department of Clinical Social Work's list of facilities offering this level of care within the geographic area requested by the patient (or if unable, by the patient's family).  09/10/12 Patient/family informed of their freedom to choose among providers that offer the needed level of care, that participate in Medicare, Medicaid or managed care program needed by the patient, have an available bed and are willing to accept the patient.  09/10/12 Patient/family informed of [facility]' ownership interest in [facility], as well as of the fact that they are under no obligation to receive care at this facility.  PASARR submitted to EDS on 09/10/12  PASARR number received from EDS on 09/10/12  FL2 transmitted to all facilities in geographic area requested by pt/family on 09/10/12  FL2 transmitted to all facilities within larger geographic area on  Patient informed that his/her managed care company has contracts with or will negotiate with certain facilities, including the following:  Patient/family informed of bed offers received: 09/12/2012 Patient chooses bed at [facility] Physician recommends and patient chooses bed at  Patient to be transferred to on 09/13/12 Patient to be transferred to facility by [facility] The following physician request were entered in Epic:  Additional Comments:   The facility has discharge summary and can be transferred. Patient's son request Ambulance transport.

## 2012-09-12 NOTE — Progress Notes (Signed)
Occupational Therapy Treatment Patient Details Name: [name] MRN: [id number] DOB: [birth year] Today's Date: 09/12/2012 Time: 6213-0865 OT Time Calculation (min): 15 min  OT Assessment / Plan / Recommendation Comments on Treatment Session 72 yo female s/p Rt THR with posterior hip precautions. Pt with incr mobility this session and 2 out3 recall of precautions. Recommend SNf for d/c    Follow Up Recommendations  SNF    Barriers to Discharge       Equipment Recommendations  None recommended by OT    Recommendations for Other Services    Frequency Min 2X/week   Plan Discharge plan remains appropriate    Precautions / Restrictions Precautions Precautions: Posterior Hip Precaution Booklet Issued: Yes (comment) Precaution Comments: pt recalled 2 out 3 (forgetting internal rotation and pt reeducated) Restrictions Weight Bearing Restrictions: Yes RLE Weight Bearing: Weight bearing as tolerated   Pertinent Vitals/Pain     ADL  Grooming: Wash/dry hands;Min guard Where Assessed - Grooming: Unsupported standing Toilet Transfer: Min Pension scheme manager Method: Sit to Barista: Regular height toilet;Grab bars Toileting - Architect and Hygiene: Min guard Where Assessed - Engineer, mining and Hygiene: Sit to stand from 3-in-1 or toilet Equipment Used: Gait belt;Rolling walker Transfers/Ambulation Related to ADLs: Pt ambulating Min guard this session ADL Comments: pt completed bed mobility, recalled 2 out 3 hip precautions, toilet transfer and grooming at sink level. Pt progressing well and incr recall of precautions. Pt naturally likes to cross bil ankles but when asked is able to verbalize this is not safe hip precautions. Pt laughs throughout session.    OT Diagnosis:    OT Problem List:   OT Treatment Interventions:     OT Goals Acute Rehab OT Goals OT Goal Formulation: With patient Time For Goal Achievement:  09/24/12 Potential to Achieve Goals: Good ADL Goals Pt Will Perform Lower Body Bathing: with supervision;Sitting, chair;with adaptive equipment Pt Will Perform Lower Body Dressing: with supervision;Sitting, chair;with adaptive equipment Pt Will Transfer to Toilet: with supervision;Ambulation;3-in-1 ADL Goal: Toilet Transfer - Progress: Progressing toward goals Miscellaneous OT Goals Miscellaneous OT Goal #1: Pt will complete bed mobility MOD I with HOB flat OT Goal: Miscellaneous Goal #1 - Progress: Progressing toward goals  Visit Information  Last OT Received On: 09/12/12 Assistance Needed: +1    Subjective Data      Prior Functioning       Cognition  Cognition Overall Cognitive Status: Appears within functional limits for tasks assessed/performed Arousal/Alertness: Awake/alert Orientation Level: Appears intact for tasks assessed Behavior During Session: [facility] for tasks performed Cognition - Other Comments: Pt with poor demo of hip precautions with adls but incr recall at this time of posterior hip precautions.      Mobility  Bed Mobility Bed Mobility: Supine to Sit;Sitting - Scoot to Edge of Bed;Sit to Supine Supine to Sit: 5: Supervision;HOB flat;With rails Sitting - Scoot to Edge of Bed: 5: Supervision Sit to Supine: 6: Modified independent (Device/Increase time);HOB flat Details for Bed Mobility Assistance: incr time to complete task Transfers Transfers: Stand to Sit;Sit to Stand Sit to Stand: 4: Min assist;From bed;With upper extremity assist Stand to Sit: 4: Min guard;With upper extremity assist;To bed Details for Transfer Assistance: Pt with posterior lean initially with static standing using bed as support. pt with poor hand placement and safety with RW    Exercises      Balance Balance Balance Assessed: Yes Static Standing Balance Static Standing - Balance Support: Bilateral upper extremity supported;During  functional activity Static Standing - Level of  Assistance: 5: Stand by assistance Static Standing - Comment/# of Minutes: ~1   End of Session OT - End of Session Activity Tolerance: Patient tolerated treatment well Patient left: in bed;with call bell/phone within reach Nurse Communication: Mobility status;Precautions  GO     Lucile Shutters 09/12/2012, 3:57 PM Pager: [phone]

## 2012-09-12 NOTE — Discharge Summary (Addendum)
Physician Discharge Summary  Patient ID: [name] MRN: [id number] DOB/AGE: 1941/01/21 72 y.o.  Admit date: 09/10/2012 Discharge date: 09/13/2012  Admission Diagnoses: Osteoarthritis right hip  Discharge Diagnoses: Osteoarthritis right hip Active Problems:   * No active hospital problems. *   Discharged Condition: stable  Hospital Course: Patient's hospital course was essentially unremarkable. She underwent a right total hip arthroplasty. Postoperatively patient progressed slowly with therapy she lives independently at home and will require discharge to short-term skilled nursing for additional therapy.  Consults: None  Significant Diagnostic Studies: labs: Routine labs  Treatments: surgery: See operative note  Discharge Exam: Blood pressure 132/59, pulse 88, temperature 99.1 F (37.3 C), temperature source Oral, resp. rate 18, SpO2 91.00%. Incision/Wound: incision clean and dry at time of discharge  Disposition: 01-Home or Self Care      Discharge Orders   Future Orders Complete By Expires     Call MD / Call 911  As directed     Comments:      If you experience chest pain or shortness of breath, CALL 911 and be transported to the hospital emergency room.  If you develope a fever above 101 F, pus (white drainage) or increased drainage or redness at the wound, or calf pain, call your surgeon's office.    Constipation Prevention  As directed     Comments:      Drink plenty of fluids.  Prune juice may be helpful.  You may use a stool softener, such as Colace (over the counter) 100 mg twice a day.  Use MiraLax (over the counter) for constipation as needed.    Diet - low sodium heart healthy  As directed     Increase activity slowly as tolerated  As directed         Medication List    TAKE these medications       amLODipine 10 MG tablet  Commonly known as:  NORVASC  Take 10 mg by mouth daily.     aspirin 81 MG tablet  Take 81 mg by mouth daily.     aspirin EC  325 MG tablet  Take 1 tablet (325 mg total) by mouth daily.     atorvastatin 40 MG tablet  Commonly known as:  LIPITOR  Take 40 mg by mouth daily.     carvedilol 6.25 MG tablet  Commonly known as:  COREG  Take 6.25 mg by mouth 2 (two) times daily with a meal.     cloNIDine 0.2 MG tablet  Commonly known as:  CATAPRES  Take 0.2 mg by mouth 2 (two) times daily.     diclofenac 75 MG EC tablet  Commonly known as:  VOLTAREN  Take 75 mg by mouth 2 (two) times daily.     Glucosamine-Chondroitin Caps  Take 1 capsule by mouth daily.     hydrochlorothiazide 12.5 MG capsule  Commonly known as:  MICROZIDE  Take 12.5 mg by mouth daily.     HYDROcodone-acetaminophen 7.5-325 MG per tablet  Commonly known as:  NORCO  Take 1 tablet by mouth every 4 (four) hours as needed for pain.     lisinopril 40 MG tablet  Commonly known as:  PRINIVIL,ZESTRIL  Take 40 mg by mouth daily.     multivitamin with minerals Tabs  Take 1 tablet by mouth daily.     traMADol 50 MG tablet  Commonly known as:  ULTRAM  Take 1 tablet (50 mg total) by mouth every 6 (six) hours as needed  for pain. Maximum dose= 8 tablets per day       Follow-up Information   Follow up with DUDA,MARCUS V, MD In 2 weeks.   Contact information:   [address] [place] [address] [phone]       Signed: Jacqualine Code 09/13/2012, 9:47 AM

## 2012-09-13 DIAGNOSIS — E785 Hyperlipidemia, unspecified: Secondary | ICD-10-CM | POA: Diagnosis not present

## 2012-09-13 DIAGNOSIS — M1990 Unspecified osteoarthritis, unspecified site: Secondary | ICD-10-CM | POA: Diagnosis not present

## 2012-09-13 DIAGNOSIS — D649 Anemia, unspecified: Secondary | ICD-10-CM | POA: Diagnosis not present

## 2012-09-13 DIAGNOSIS — M25559 Pain in unspecified hip: Secondary | ICD-10-CM | POA: Diagnosis not present

## 2012-09-13 DIAGNOSIS — K5900 Constipation, unspecified: Secondary | ICD-10-CM | POA: Diagnosis not present

## 2012-09-13 DIAGNOSIS — M169 Osteoarthritis of hip, unspecified: Secondary | ICD-10-CM | POA: Diagnosis not present

## 2012-09-13 DIAGNOSIS — M25569 Pain in unspecified knee: Secondary | ICD-10-CM | POA: Diagnosis not present

## 2012-09-13 DIAGNOSIS — R262 Difficulty in walking, not elsewhere classified: Secondary | ICD-10-CM | POA: Diagnosis not present

## 2012-09-13 DIAGNOSIS — R279 Unspecified lack of coordination: Secondary | ICD-10-CM | POA: Diagnosis not present

## 2012-09-13 DIAGNOSIS — M1610 Unilateral primary osteoarthritis, unspecified hip: Secondary | ICD-10-CM | POA: Diagnosis not present

## 2012-09-13 DIAGNOSIS — E559 Vitamin D deficiency, unspecified: Secondary | ICD-10-CM | POA: Diagnosis not present

## 2012-09-13 DIAGNOSIS — M6281 Muscle weakness (generalized): Secondary | ICD-10-CM | POA: Diagnosis not present

## 2012-09-13 DIAGNOSIS — S72009D Fracture of unspecified part of neck of unspecified femur, subsequent encounter for closed fracture with routine healing: Secondary | ICD-10-CM | POA: Diagnosis not present

## 2012-09-13 DIAGNOSIS — S99919A Unspecified injury of unspecified ankle, initial encounter: Secondary | ICD-10-CM | POA: Diagnosis not present

## 2012-09-13 DIAGNOSIS — I10 Essential (primary) hypertension: Secondary | ICD-10-CM | POA: Diagnosis not present

## 2012-09-13 LAB — BASIC METABOLIC PANEL
GFR calc Af Amer: >90 mL/min (ref >90)
GFR calc non Af Amer: 86 mL/min — ABNORMAL LOW (ref >90)
Potassium: 3.7 mEq/L (ref 3.5–5.1)
Sodium: 134 mEq/L — ABNORMAL LOW (ref 135–145)

## 2012-09-13 LAB — CBC
MCHC: 35 g/dL (ref 30.0–36.0)
RDW: 12.8 % (ref 11.5–15.5)

## 2012-09-13 NOTE — Progress Notes (Signed)
Seen and agreed 09/13/2012 Mayra Jolliffe Elizabeth PTA [phone] pager [phone] office    

## 2012-09-13 NOTE — Progress Notes (Signed)
Physical Therapy Treatment Patient Details Name: [name] MRN: [id number] DOB: 1940-11-08 Today's Date: 09/13/2012 Time: 5621-3086 PT Time Calculation (min): 28 min  PT Assessment / Plan / Recommendation Comments on Treatment Session  Pt s/p R THA with posterior precautions. Improved cognitive functioning this visit. Pt is eager to participate and improve mobility. Able to incorporate cues and maintain throughout ambulation. However, she needs constant reminding throughout mobility to avoid bending past 90. Plans to D/C to SNF later this am.     Follow Up Recommendations  SNF     Does the patient have the potential to tolerate intense rehabilitation     Barriers to Discharge        Equipment Recommendations  Rolling walker with 5" wheels    Recommendations for Other Services    Frequency 7X/week   Plan Discharge plan remains appropriate;Frequency remains appropriate    Precautions / Restrictions Precautions Precautions: Posterior Hip Precaution Comments: Pt able to recall 2/3 independently. Min cueing to recall crossing legs. Required cues throughout to maintain hip flexion precaution. Restrictions RLE Weight Bearing: Weight bearing as tolerated   Pertinent Vitals/Pain no apparent distress      Mobility  Transfers Sit to Stand: With upper extremity assist;From toilet;4: Min guard Stand to Sit: 4: Min guard;With upper extremity assist;To chair/3-in-1 Details for Transfer Assistance: Cueing to maintain precautions by kicking out RLE and controlling descent. Pt able to demonstrate proper technique after two trials.  Ambulation/Gait Ambulation/Gait Assistance: 4: Min guard Ambulation Distance (Feet): 250 Feet Assistive device: Rolling walker Ambulation/Gait Assistance Details: cues for posture and RW management Gait Pattern: Step-through pattern;Decreased stride length    Exercises Total Joint Exercises Ankle Circles/Pumps: AROM;Both;10 reps Gluteal Sets:  AROM;Both;10 reps Knee Flexion: AAROM;Right;10 reps   PT Diagnosis:    PT Problem List:   PT Treatment Interventions:     PT Goals Acute Rehab PT Goals PT Goal: Sit to Stand - Progress: Progressing toward goal PT Goal: Stand to Sit - Progress: Progressing toward goal PT Goal: Ambulate - Progress: Progressing toward goal PT Goal: Perform Home Exercise Program - Progress: Progressing toward goal Additional Goals PT Goal: Additional Goal #1 - Progress: Progressing toward goal  Visit Information  Last PT Received On: 09/13/12 Assistance Needed: +1    Subjective Data      Cognition  Cognition Overall Cognitive Status: Appears within functional limits for tasks assessed/performed Arousal/Alertness: Awake/alert Behavior During Session: [facility] for tasks performed    Balance     End of Session PT - End of Session Equipment Utilized During Treatment: Gait belt Activity Tolerance: Patient tolerated treatment well Patient left: in chair;with call bell/phone within reach (with PA) Nurse Communication: Mobility status   GP     Lazaro Arms 09/13/2012, 10:02 AM

## 2012-09-13 NOTE — Progress Notes (Signed)
CSW contacted [facility], spoke to [place] who stated pt is scheduled to be admitted there today.  Per Marylu Lund, any time would suffice.  CSW called RN, Clydie Braun who checked the chart to find the FL2 had not been signed.  Clydie Braun, RN will page MD to sign FL2 and call CSW.  CSW and RN are projecting an 11am d/c.  Per Marylu Lund at [facility] no need to call when pt is ready, just send.  CSW advised Marylu Lund ([facility]) that pt would be transported via [facility].  CSW is awaiting a return call from RN to proceed with d/c.  Vickii Penna, LCSWA [phone] Clinical Social Work

## 2012-09-13 NOTE — Progress Notes (Signed)
Patient ID: [name], female   DOB: 05/11/1941, 72 y.o.   MRN: [id number] PATIENT ID: [name]        MRN:  [id number]          DOB/AGE: Mar 20, 1941 / 72 y.o.    Norlene Campbell, MD   Jacqualine Code, PA-C [address] [place], [place]  [address]                             [phone]   PROGRESS NOTE  Subjective:  negative for Chest Pain  negative for Shortness of Breath  negative for Nausea/Vomiting   negative for Calf Pain    Tolerating Diet: yes         Patient reports pain as mild.     Planning to go to [place] today  Objective: Vital signs in last 24 hours:   Patient Vitals for the past 24 hrs:  BP Temp Temp src Pulse Resp SpO2  09/13/12 0526 132/59 mmHg 99.1 F (37.3 C) Oral 88 18 91 %  09/13/12 0000 - - - - 18 91 %  09/12/12 2145 115/60 mmHg - - - - -  09/12/12 2011 110/46 mmHg 98.5 F (36.9 C) Oral 81 18 91 %  09/12/12 2000 - - - - 18 91 %  09/12/12 1338 91/50 mmHg 98 F (36.7 C) - 64 16 95 %      Intake/Output from previous day:   02/14 0701 - 02/15 0700 In: 360 [P.O.:360] Out: 1 [Urine:1]   Intake/Output this shift:   02/15 0701 - 02/15 1900 In: 240 [P.O.:240] Out: -    Intake/Output     02/14 0701 - 02/15 0700 02/15 0701 - 02/16 0700   P.O. 360 240   Total Intake 360 240   Urine 1    Total Output 1     Net +359 +240        Urine Occurrence 1 x 1 x      LABORATORY DATA:  Recent Labs  09/11/12 0650 09/12/12 0615 09/13/12 0730  WBC 10.3 9.1 9.7  HGB 12.8 12.0 11.4*  HCT 36.8 34.5* 32.6*  PLT 257 235 244    Recent Labs  09/11/12 0650 09/12/12 0615 09/13/12 0730  NA 135 135 134*  K 3.8 3.6 3.7  CL 98 101 98  CO2 25 25 26   BUN 11 9 12   CREATININE 0.84 0.67 0.69  GLUCOSE 137* 117* 122*  CALCIUM 9.0 8.8 9.0   Lab Results  Component Value Date   INR 0.93 09/03/2012   INR 0.9 03/07/2007   INR 1.1 02/14/2007    Recent Radiographic Studies :  Dg Hip Complete Right  09/10/2012  *RADIOLOGY REPORT*  Clinical Data:  Postop  RIGHT HIP - COMPLETE 2+ VIEW  Comparison: None.  Findings: Three views of the right hip submitted.  There is right hip prosthesis in anatomic alignment.  Postsurgical changes are noted with a small amount of soft tissue periarticular air. Lateral skin staples.  IMPRESSION: Right hip prosthesis in anatomic alignment.  Postsurgical changes are noted.   Original Report Authenticated By: Natasha Mead, M.D.      Examination:  General appearance: alert, cooperative and mild distress  Wound Exam: clean, dry, intact   Drainage:  None: wound tissue dry  Motor Exam: EHL, FHL, Anterior Tibial and Posterior Tibial Intact  Sensory Exam: Superficial Peroneal, Deep Peroneal and Tibial normal   Assessment:  3 Days Post-Op  Procedure(s) (LRB): TOTAL HIP ARTHROPLASTY (Right)  ADDITIONAL DIAGNOSIS:  Active Problems:   * No active hospital problems. *     Plan: Physical Therapy as ordered Weight Bearing as Tolerated (WBAT)  DVT Prophylaxis:  Aspirin  DISCHARGE PLAN: Skilled Nursing Facility/Rehab  DISCHARGE NEEDS: Dan Humphreys  To [facility] today         [facility] 09/13/2012 9:49 AM

## 2012-09-13 NOTE — Progress Notes (Signed)
CSW spoke with RN re: transport to [facility].  CSW confirmed with Marylu Lund at [facility] that SNF is expecting pt admission.  This was confirmed.  CSW contacted [facility] (non-emergency transport services) for transport at 10:54am.  CSW placed FL2 and Rx in d/c packet.  D/c packet was placed on chart along with transport documentation.  CSW is available for assistance.  Vickii Penna, LCSWA [phone]  Clinical Social Work

## 2012-09-15 ENCOUNTER — Encounter (HOSPITAL_COMMUNITY): Payer: Self-pay | Admitting: Orthopedic Surgery

## 2012-09-18 DIAGNOSIS — M25559 Pain in unspecified hip: Secondary | ICD-10-CM | POA: Diagnosis not present

## 2012-09-19 DIAGNOSIS — M1990 Unspecified osteoarthritis, unspecified site: Secondary | ICD-10-CM | POA: Diagnosis not present

## 2012-09-19 DIAGNOSIS — K5900 Constipation, unspecified: Secondary | ICD-10-CM | POA: Diagnosis not present

## 2012-09-19 DIAGNOSIS — I10 Essential (primary) hypertension: Secondary | ICD-10-CM | POA: Diagnosis not present

## 2012-09-19 DIAGNOSIS — D649 Anemia, unspecified: Secondary | ICD-10-CM | POA: Diagnosis not present

## 2012-09-22 ENCOUNTER — Telehealth: Payer: Self-pay | Admitting: Cardiology

## 2012-09-22 NOTE — Telephone Encounter (Signed)
Left pt a message to call back. 

## 2012-09-22 NOTE — Telephone Encounter (Signed)
Pt rtn call re carotid results, if doesn't answer cell number , will be at rehab, can call and leave message on home number @ [phone]

## 2012-09-23 ENCOUNTER — Telehealth: Payer: Self-pay | Admitting: Cardiology

## 2012-09-23 NOTE — Telephone Encounter (Signed)
New problem     Patient returning your call.

## 2012-09-23 NOTE — Telephone Encounter (Signed)
Spoke with pt, aware of carotid results 

## 2012-09-23 NOTE — Telephone Encounter (Signed)
Left message of results and to repeat in 6 months.  Requested she call back if further questions

## 2012-10-08 ENCOUNTER — Ambulatory Visit: Payer: Medicare Other | Attending: Orthopedic Surgery | Admitting: Physical Therapy

## 2012-10-08 DIAGNOSIS — IMO0001 Reserved for inherently not codable concepts without codable children: Secondary | ICD-10-CM | POA: Diagnosis not present

## 2012-10-08 DIAGNOSIS — R262 Difficulty in walking, not elsewhere classified: Secondary | ICD-10-CM | POA: Diagnosis not present

## 2012-10-08 DIAGNOSIS — M25559 Pain in unspecified hip: Secondary | ICD-10-CM | POA: Diagnosis not present

## 2012-10-13 ENCOUNTER — Ambulatory Visit: Payer: Medicare Other | Admitting: Physical Therapy

## 2012-10-15 DIAGNOSIS — Z96649 Presence of unspecified artificial hip joint: Secondary | ICD-10-CM | POA: Diagnosis not present

## 2012-10-15 DIAGNOSIS — M169 Osteoarthritis of hip, unspecified: Secondary | ICD-10-CM | POA: Diagnosis not present

## 2012-10-16 ENCOUNTER — Ambulatory Visit: Payer: Medicare Other | Admitting: Physical Therapy

## 2012-10-16 DIAGNOSIS — IMO0001 Reserved for inherently not codable concepts without codable children: Secondary | ICD-10-CM | POA: Diagnosis not present

## 2012-10-16 DIAGNOSIS — R262 Difficulty in walking, not elsewhere classified: Secondary | ICD-10-CM | POA: Diagnosis not present

## 2012-10-16 DIAGNOSIS — M25559 Pain in unspecified hip: Secondary | ICD-10-CM | POA: Diagnosis not present

## 2012-10-21 ENCOUNTER — Ambulatory Visit: Payer: Medicare Other | Admitting: Physical Therapy

## 2012-10-23 ENCOUNTER — Ambulatory Visit: Payer: Medicare Other | Admitting: Physical Therapy

## 2012-10-23 DIAGNOSIS — M25559 Pain in unspecified hip: Secondary | ICD-10-CM | POA: Diagnosis not present

## 2012-10-23 DIAGNOSIS — IMO0001 Reserved for inherently not codable concepts without codable children: Secondary | ICD-10-CM | POA: Diagnosis not present

## 2012-10-23 DIAGNOSIS — R262 Difficulty in walking, not elsewhere classified: Secondary | ICD-10-CM | POA: Diagnosis not present

## 2012-10-24 ENCOUNTER — Ambulatory Visit: Payer: Medicare Other | Admitting: Physical Therapy

## 2012-10-24 DIAGNOSIS — M25559 Pain in unspecified hip: Secondary | ICD-10-CM | POA: Diagnosis not present

## 2012-10-24 DIAGNOSIS — IMO0001 Reserved for inherently not codable concepts without codable children: Secondary | ICD-10-CM | POA: Diagnosis not present

## 2012-10-24 DIAGNOSIS — R262 Difficulty in walking, not elsewhere classified: Secondary | ICD-10-CM | POA: Diagnosis not present

## 2012-10-28 ENCOUNTER — Ambulatory Visit: Payer: Medicare Other | Attending: Orthopedic Surgery | Admitting: Physical Therapy

## 2012-10-28 DIAGNOSIS — M25559 Pain in unspecified hip: Secondary | ICD-10-CM | POA: Insufficient documentation

## 2012-10-28 DIAGNOSIS — IMO0001 Reserved for inherently not codable concepts without codable children: Secondary | ICD-10-CM | POA: Insufficient documentation

## 2012-10-28 DIAGNOSIS — R262 Difficulty in walking, not elsewhere classified: Secondary | ICD-10-CM | POA: Insufficient documentation

## 2012-10-30 ENCOUNTER — Ambulatory Visit: Payer: Medicare Other | Admitting: Physical Therapy

## 2012-11-03 ENCOUNTER — Ambulatory Visit: Payer: Medicare Other | Admitting: Physical Therapy

## 2012-11-04 ENCOUNTER — Encounter: Payer: Self-pay | Admitting: Cardiology

## 2012-11-04 ENCOUNTER — Ambulatory Visit (INDEPENDENT_AMBULATORY_CARE_PROVIDER_SITE_OTHER): Payer: Medicare Other | Admitting: Cardiology

## 2012-11-04 VITALS — BP 100/54 | HR 70 | Ht 63.5 in | Wt 136.8 lb

## 2012-11-04 DIAGNOSIS — E785 Hyperlipidemia, unspecified: Secondary | ICD-10-CM

## 2012-11-04 DIAGNOSIS — I2510 Atherosclerotic heart disease of native coronary artery without angina pectoris: Secondary | ICD-10-CM | POA: Diagnosis not present

## 2012-11-04 DIAGNOSIS — I739 Peripheral vascular disease, unspecified: Secondary | ICD-10-CM | POA: Diagnosis not present

## 2012-11-04 DIAGNOSIS — I10 Essential (primary) hypertension: Secondary | ICD-10-CM | POA: Diagnosis not present

## 2012-11-04 DIAGNOSIS — F17200 Nicotine dependence, unspecified, uncomplicated: Secondary | ICD-10-CM

## 2012-11-04 MED ORDER — CLONIDINE HCL 0.1 MG PO TABS: 0.1000 mg | ORAL_TABLET | Freq: Two times a day (BID) | ORAL | Status: DC

## 2012-11-04 NOTE — Assessment & Plan Note (Signed)
Continue statin. Lipids and liver monitored by primary care. 

## 2012-11-04 NOTE — Assessment & Plan Note (Signed)
Continue aspirin and statin. Followup carotid Dopplers August 2014.

## 2012-11-04 NOTE — Assessment & Plan Note (Signed)
Continue aspirin and statin. 

## 2012-11-04 NOTE — Patient Instructions (Addendum)
Your physician wants you to follow-up in: ONE YEAR WITH DR Shelda Pal will receive a reminder letter in the mail two months in advance. If you don't receive a letter, please call our office to schedule the follow-up appointment.   DECREASE CLONIDINE TO 0.1 MG TWICE DAILY

## 2012-11-04 NOTE — Progress Notes (Signed)
HPI: [name] is a pleasant female who has had a history of coronary artery disease. She is status post PCI of her circumflex and right coronary artery with drug-eluting stents in August 2008. A Myoview was performed in December of 2012. There was a small fixed apical defect consistent with thinning but no ischemia. Ejection fraction 75%. Last carotid Dopplers were performed in feb 2014. There was 60-79% left stenosis and 40-59% right and followup was recommended in six months. Abdominal ultrasound in January 2014 showed moderate right and severe left common iliac stenosis. ABIs in January of 2014 were normal. Patient seen by Dr Kirke Corin and medical therapy recommended for now. Since I last saw her in Dec 2013, the patient denies any dyspnea on exertion, orthopnea, PND, pedal edema, palpitations, syncope or chest pain.    Current Outpatient Prescriptions  Medication Sig Dispense Refill  . amLODipine (NORVASC) 10 MG tablet Take 10 mg by mouth daily.      [name] aspirin 81 MG tablet Take 81 mg by mouth daily.        [name] atorvastatin (LIPITOR) 40 MG tablet Take 40 mg by mouth daily.      . carvedilol (COREG) 6.25 MG tablet Take 6.25 mg by mouth 2 (two) times daily with a meal.      . cloNIDine (CATAPRES) 0.2 MG tablet Take 0.2 mg by mouth 2 (two) times daily.      . diclofenac (VOLTAREN) 75 MG EC tablet Take 75 mg by mouth 2 (two) times daily.       . Glucosamine-Chondroit-Vit C-Mn (GLUCOSAMINE-CHONDROITIN) CAPS Take 1 capsule by mouth daily.      . hydrochlorothiazide (MICROZIDE) 12.5 MG capsule Take 12.5 mg by mouth daily.      [name] lisinopril (PRINIVIL,ZESTRIL) 40 MG tablet Take 40 mg by mouth daily.      . Multiple Vitamin (MULITIVITAMIN WITH MINERALS) TABS Take 1 tablet by mouth daily.       No current facility-administered medications for this visit.     Past Medical History  Diagnosis Date  . CAD (coronary artery disease)   . PVD (peripheral vascular disease)   . HTN (hypertension)   .  Hyperlipidemia   . Cerebrovascular disease   . Arthritis     Past Surgical History  Procedure Laterality Date  . Knee surgery    . Abdominal hysterectomy    . Coronary angioplasty      3 stents placed  . Total hip arthroplasty Right 09/10/2012    Dr Lajoyce Corners  . Total hip arthroplasty Right 09/10/2012    Procedure: TOTAL HIP ARTHROPLASTY;  Surgeon: Nadara Mustard, MD;  Location: [facility] OR;  Service: Orthopedics;  Laterality: Right;  Right Total Hip Arthroplasty    History   Social History  . Marital Status: Single    Spouse Name: N/A    Number of Children: N/A  . Years of Education: N/A   Occupational History  . Not on file.   Social History Main Topics  . Smoking status: Current Every Day Smoker -- 0.25 packs/day for 50 years    Types: Cigarettes  . Smokeless tobacco: Never Used  . Alcohol Use: No  . Drug Use: No  . Sexually Active: No   Other Topics Concern  . Not on file   Social History Narrative  . No narrative on file    ROS: no fevers or chills, productive cough, hemoptysis, dysphasia, odynophagia, melena, hematochezia, dysuria, hematuria, rash, seizure activity, orthopnea, PND, pedal edema, claudication.  Remaining systems are negative.  Physical Exam: Well-developed well-nourished in no acute distress.  Skin is warm and dry.  HEENT is normal.  Neck is supple.  Chest is clear to auscultation with normal expansion.  Cardiovascular exam is regular rate and rhythm.  Abdominal exam nontender or distended. No masses palpated. Extremities show no edema. neuro grossly intact  ECG sinus rhythm with no ST changes.

## 2012-11-04 NOTE — Assessment & Plan Note (Signed)
Patient counseled on discontinuing. 

## 2012-11-04 NOTE — Assessment & Plan Note (Signed)
Blood pressure is running low. Decrease clonidine to 0.1 mg by mouth twice a day.

## 2012-11-06 ENCOUNTER — Ambulatory Visit: Payer: Medicare Other | Admitting: Physical Therapy

## 2012-12-09 ENCOUNTER — Other Ambulatory Visit: Payer: Self-pay | Admitting: Cardiology

## 2013-01-29 ENCOUNTER — Other Ambulatory Visit: Payer: Self-pay | Admitting: *Deleted

## 2013-01-29 DIAGNOSIS — I2510 Atherosclerotic heart disease of native coronary artery without angina pectoris: Secondary | ICD-10-CM

## 2013-01-29 MED ORDER — CLONIDINE HCL 0.1 MG PO TABS: 0.1000 mg | ORAL_TABLET | Freq: Two times a day (BID) | ORAL | Status: DC

## 2013-01-29 MED ORDER — CARVEDILOL 6.25 MG PO TABS: 6.2500 mg | ORAL_TABLET | Freq: Two times a day (BID) | ORAL | Status: DC

## 2013-04-01 DIAGNOSIS — Z23 Encounter for immunization: Secondary | ICD-10-CM | POA: Diagnosis not present

## 2013-04-14 ENCOUNTER — Encounter (INDEPENDENT_AMBULATORY_CARE_PROVIDER_SITE_OTHER): Payer: Medicare Other

## 2013-04-14 DIAGNOSIS — I6529 Occlusion and stenosis of unspecified carotid artery: Secondary | ICD-10-CM | POA: Diagnosis not present

## 2013-05-06 ENCOUNTER — Other Ambulatory Visit: Payer: Self-pay | Admitting: Cardiology

## 2013-05-15 ENCOUNTER — Other Ambulatory Visit: Payer: Self-pay | Admitting: Cardiology

## 2013-06-04 ENCOUNTER — Other Ambulatory Visit: Payer: Self-pay | Admitting: Cardiology

## 2013-06-19 ENCOUNTER — Ambulatory Visit: Payer: Medicare Other | Admitting: Cardiology

## 2013-06-23 ENCOUNTER — Other Ambulatory Visit: Payer: Self-pay | Admitting: Cardiology

## 2013-07-09 ENCOUNTER — Encounter: Payer: Self-pay | Admitting: Cardiology

## 2013-07-09 ENCOUNTER — Ambulatory Visit (INDEPENDENT_AMBULATORY_CARE_PROVIDER_SITE_OTHER): Payer: Medicare Other | Admitting: Cardiology

## 2013-07-09 VITALS — BP 110/82 | HR 66 | Ht 63.5 in | Wt 140.0 lb

## 2013-07-09 DIAGNOSIS — I2510 Atherosclerotic heart disease of native coronary artery without angina pectoris: Secondary | ICD-10-CM

## 2013-07-09 DIAGNOSIS — E785 Hyperlipidemia, unspecified: Secondary | ICD-10-CM

## 2013-07-09 DIAGNOSIS — I679 Cerebrovascular disease, unspecified: Secondary | ICD-10-CM

## 2013-07-09 DIAGNOSIS — I10 Essential (primary) hypertension: Secondary | ICD-10-CM | POA: Diagnosis not present

## 2013-07-09 DIAGNOSIS — F17200 Nicotine dependence, unspecified, uncomplicated: Secondary | ICD-10-CM

## 2013-07-09 DIAGNOSIS — I739 Peripheral vascular disease, unspecified: Secondary | ICD-10-CM

## 2013-07-09 NOTE — Assessment & Plan Note (Signed)
Continue statin. Lipids and liver monitored by primary care. 

## 2013-07-09 NOTE — Assessment & Plan Note (Signed)
Continue aspirin and statin.no claudication.

## 2013-07-09 NOTE — Assessment & Plan Note (Signed)
Patient counseled on discontinuing. 

## 2013-07-09 NOTE — Progress Notes (Signed)
HPI: FU coronary artery disease. She is status post PCI of her circumflex and right coronary artery with drug-eluting stents in August 2008. A Myoview was performed in December of 2012. There was a small fixed apical defect consistent with thinning but no ischemia. Ejection fraction 75%. Last carotid Dopplers were performed in Sept 2014. There was 40-59% left stenosis and 0-39% right and followup was recommended in 12 months. Abdominal ultrasound in January 2014 showed moderate right and severe left common iliac stenosis. ABIs in January of 2014 were normal. Patient seen by Dr Kirke Corin and medical therapy recommended for now. Since I last saw her in April 2014, the patient denies any dyspnea on exertion, orthopnea, PND, pedal edema, palpitations, syncope or chest pain.    Current Outpatient Prescriptions  Medication Sig Dispense Refill  . amLODipine (NORVASC) 10 MG tablet TAKE 1 TABLET (10 MG TOTAL) BY MOUTH DAILY.  90 tablet  1  . aspirin 81 MG tablet Take 81 mg by mouth daily.        [name] atorvastatin (LIPITOR) 40 MG tablet TAKE ONE TABLET BY MOUTH DAILY.  90 tablet  1  . carvedilol (COREG) 6.25 MG tablet TAKE 1 TABLET (6.25 MG TOTAL) BY MOUTH 2 (TWO) TIMES DAILY WITH A MEAL.  60 tablet  3  . cloNIDine (CATAPRES) 0.1 MG tablet Take 1 tablet (0.1 mg total) by mouth 2 (two) times daily.  60 tablet  12  . diclofenac (VOLTAREN) 75 MG EC tablet Take 75 mg by mouth 2 (two) times daily.       . Glucosamine-Chondroit-Vit C-Mn (GLUCOSAMINE-CHONDROITIN) CAPS Take 1 capsule by mouth daily.      . hydrochlorothiazide (MICROZIDE) 12.5 MG capsule TAKE 1 CAPSULE (12.5 MG TOTAL) BY MOUTH EVERY MORNING.  90 capsule  1  . lisinopril (PRINIVIL,ZESTRIL) 40 MG tablet TAKE 1 TABLET (40 MG TOTAL) BY MOUTH DAILY.  90 tablet  3  . Multiple Vitamin (MULITIVITAMIN WITH MINERALS) TABS Take 1 tablet by mouth daily.       No current facility-administered medications for this visit.     Past Medical History  Diagnosis  Date  . CAD (coronary artery disease)   . PVD (peripheral vascular disease)   . HTN (hypertension)   . Hyperlipidemia   . Cerebrovascular disease   . Arthritis     Past Surgical History  Procedure Laterality Date  . Knee surgery    . Abdominal hysterectomy    . Coronary angioplasty      3 stents placed  . Total hip arthroplasty Right 09/10/2012    Dr Lajoyce Corners  . Total hip arthroplasty Right 09/10/2012    Procedure: TOTAL HIP ARTHROPLASTY;  Surgeon: Nadara Mustard, MD;  Location: [facility] OR;  Service: Orthopedics;  Laterality: Right;  Right Total Hip Arthroplasty    History   Social History  . Marital Status: Single    Spouse Name: N/A    Number of Children: N/A  . Years of Education: N/A   Occupational History  . Not on file.   Social History Main Topics  . Smoking status: Current Every Day Smoker -- 0.25 packs/day for 50 years    Types: Cigarettes  . Smokeless tobacco: Never Used  . Alcohol Use: No  . Drug Use: No  . Sexual Activity: No   Other Topics Concern  . Not on file   Social History Narrative  . No narrative on file    ROS: no fevers or chills, productive cough, hemoptysis,  dysphasia, odynophagia, melena, hematochezia, dysuria, hematuria, rash, seizure activity, orthopnea, PND, pedal edema, claudication. Remaining systems are negative.  Physical Exam: Well-developed well-nourished in no acute distress.  Skin is warm and dry.  HEENT is normal.  Neck is supple.  Chest is clear to auscultation with normal expansion.  Cardiovascular exam is regular rate and rhythm.  Abdominal exam nontender or distended. No masses palpated. Extremities show no edema. neuro grossly intact  ECG sinus rhythm at a rate of 67. No ST changes. First degree AV block.

## 2013-07-09 NOTE — Patient Instructions (Signed)
Your physician wants you to follow-up in: ONE YEAR WITH DR CRENSHAW You will receive a reminder letter in the mail two months in advance. If you don't receive a letter, please call our office to schedule the follow-up appointment.  

## 2013-07-09 NOTE — Assessment & Plan Note (Signed)
Blood pressure controlled. Continue present medications. Potassium and renal function monitored by primary care. 

## 2013-07-09 NOTE — Assessment & Plan Note (Signed)
Continue aspirin and statin. I will carotid Dopplers September 2015.

## 2013-07-09 NOTE — Assessment & Plan Note (Signed)
Continue aspirin and statin. 

## 2013-10-19 ENCOUNTER — Other Ambulatory Visit: Payer: Self-pay | Admitting: *Deleted

## 2013-10-19 MED ORDER — CARVEDILOL 6.25 MG PO TABS: ORAL_TABLET | ORAL | Status: DC

## 2013-11-11 ENCOUNTER — Other Ambulatory Visit: Payer: Self-pay

## 2013-11-11 MED ORDER — AMLODIPINE BESYLATE 10 MG PO TABS: ORAL_TABLET | ORAL | Status: DC

## 2013-11-11 MED ORDER — HYDROCHLOROTHIAZIDE 12.5 MG PO CAPS: ORAL_CAPSULE | ORAL | Status: DC

## 2013-12-14 ENCOUNTER — Other Ambulatory Visit: Payer: Self-pay | Admitting: *Deleted

## 2013-12-14 MED ORDER — ATORVASTATIN CALCIUM 40 MG PO TABS: ORAL_TABLET | ORAL | Status: DC

## 2014-02-24 ENCOUNTER — Other Ambulatory Visit: Payer: Self-pay

## 2014-02-24 MED ORDER — CARVEDILOL 6.25 MG PO TABS: ORAL_TABLET | ORAL | Status: DC

## 2014-02-26 ENCOUNTER — Other Ambulatory Visit: Payer: Self-pay

## 2014-02-26 MED ORDER — CLONIDINE HCL 0.1 MG PO TABS: 0.1000 mg | ORAL_TABLET | Freq: Two times a day (BID) | ORAL | Status: DC

## 2014-04-10 DIAGNOSIS — Z23 Encounter for immunization: Secondary | ICD-10-CM | POA: Diagnosis not present

## 2014-04-28 ENCOUNTER — Other Ambulatory Visit (HOSPITAL_COMMUNITY): Payer: Self-pay | Admitting: *Deleted

## 2014-04-28 DIAGNOSIS — I6529 Occlusion and stenosis of unspecified carotid artery: Secondary | ICD-10-CM

## 2014-05-14 ENCOUNTER — Other Ambulatory Visit: Payer: Self-pay | Admitting: *Deleted

## 2014-05-14 MED ORDER — LISINOPRIL 40 MG PO TABS: ORAL_TABLET | ORAL | Status: DC

## 2014-05-18 ENCOUNTER — Other Ambulatory Visit: Payer: Self-pay | Admitting: *Deleted

## 2014-05-18 ENCOUNTER — Ambulatory Visit (HOSPITAL_COMMUNITY): Payer: Medicare Other | Attending: Cardiovascular Disease | Admitting: *Deleted

## 2014-05-18 DIAGNOSIS — Z720 Tobacco use: Secondary | ICD-10-CM | POA: Diagnosis not present

## 2014-05-18 DIAGNOSIS — Z951 Presence of aortocoronary bypass graft: Secondary | ICD-10-CM | POA: Insufficient documentation

## 2014-05-18 DIAGNOSIS — I6523 Occlusion and stenosis of bilateral carotid arteries: Secondary | ICD-10-CM

## 2014-05-18 DIAGNOSIS — I739 Peripheral vascular disease, unspecified: Secondary | ICD-10-CM | POA: Diagnosis not present

## 2014-05-18 DIAGNOSIS — I679 Cerebrovascular disease, unspecified: Secondary | ICD-10-CM

## 2014-05-18 DIAGNOSIS — I10 Essential (primary) hypertension: Secondary | ICD-10-CM | POA: Insufficient documentation

## 2014-05-18 DIAGNOSIS — E785 Hyperlipidemia, unspecified: Secondary | ICD-10-CM | POA: Insufficient documentation

## 2014-05-18 DIAGNOSIS — J449 Chronic obstructive pulmonary disease, unspecified: Secondary | ICD-10-CM | POA: Insufficient documentation

## 2014-05-18 DIAGNOSIS — I2510 Atherosclerotic heart disease of native coronary artery without angina pectoris: Secondary | ICD-10-CM | POA: Insufficient documentation

## 2014-05-18 MED ORDER — HYDROCHLOROTHIAZIDE 12.5 MG PO CAPS: ORAL_CAPSULE | ORAL | Status: DC

## 2014-05-18 MED ORDER — AMLODIPINE BESYLATE 10 MG PO TABS: ORAL_TABLET | ORAL | Status: DC

## 2014-05-18 NOTE — Progress Notes (Signed)
Carotid Duplex Performed

## 2014-06-11 ENCOUNTER — Encounter: Payer: Self-pay | Admitting: Cardiology

## 2014-06-23 ENCOUNTER — Other Ambulatory Visit: Payer: Self-pay | Admitting: *Deleted

## 2014-06-23 MED ORDER — CARVEDILOL 6.25 MG PO TABS: ORAL_TABLET | ORAL | Status: DC

## 2014-06-23 MED ORDER — ATORVASTATIN CALCIUM 40 MG PO TABS: ORAL_TABLET | ORAL | Status: DC

## 2014-07-09 ENCOUNTER — Other Ambulatory Visit: Payer: Self-pay | Admitting: *Deleted

## 2014-07-12 NOTE — Progress Notes (Signed)
HPI: FU coronary artery disease. She is status post PCI of her circumflex and right coronary artery with drug-eluting stents in August 2008. A Myoview was performed in December of 2012. There was a small fixed apical defect consistent with thinning but no ischemia. Ejection fraction 75%. Abdominal ultrasound in January 2014 showed moderate right and severe left common iliac stenosis. ABIs in January of 2014 were normal. Patient seen by Dr Fletcher Anon and medical therapy recommended for now. Last carotid Dopplers were performed in Dec 2015. There was 40-59% left stenosis and 0-39% right and followup was recommended in 12 months. Since I last saw her, the patient has dyspnea with more extreme activities but not with routine activities. It is relieved with rest. It is not associated with chest pain. There is no orthopnea, PND or pedal edema. There is no syncope or palpitations. There is no exertional chest pain.   Current Outpatient Prescriptions  Medication Sig Dispense Refill  . amLODipine (NORVASC) 10 MG tablet TAKE 1 TABLET (10 MG TOTAL) BY MOUTH DAILY. 90 tablet 1  . aspirin 81 MG tablet Take 81 mg by mouth daily.      [name] atorvastatin (LIPITOR) 40 MG tablet TAKE ONE TABLET BY MOUTH DAILY. 90 tablet 1  . carvedilol (COREG) 6.25 MG tablet TAKE 1 TABLET (6.25 MG TOTAL) BY MOUTH 2 (TWO) TIMES DAILY WITH A MEAL. 60 tablet 5  . cloNIDine (CATAPRES) 0.1 MG tablet Take 1 tablet (0.1 mg total) by mouth 2 (two) times daily. 60 tablet 6  . diclofenac (VOLTAREN) 75 MG EC tablet Take 75 mg by mouth 2 (two) times daily.     [name] FLUZONE HIGH-DOSE 0.5 ML SUSY   0  . Glucosamine-Chondroit-Vit C-Mn (GLUCOSAMINE-CHONDROITIN) CAPS Take 1 capsule by mouth daily.    . hydrochlorothiazide (MICROZIDE) 12.5 MG capsule TAKE 1 CAPSULE (12.5 MG TOTAL) BY MOUTH EVERY MORNING. 90 capsule 1  . lisinopril (PRINIVIL,ZESTRIL) 40 MG tablet TAKE 1 TABLET (40 MG TOTAL) BY MOUTH DAILY. 90 tablet 1  . Multiple Vitamin (MULITIVITAMIN  WITH MINERALS) TABS Take 1 tablet by mouth daily.     No current facility-administered medications for this visit.     Past Medical History  Diagnosis Date  . CAD (coronary artery disease)   . PVD (peripheral vascular disease)   . HTN (hypertension)   . Hyperlipidemia   . Cerebrovascular disease   . Arthritis     Past Surgical History  Procedure Laterality Date  . Knee surgery    . Abdominal hysterectomy    . Coronary angioplasty      3 stents placed  . Total hip arthroplasty Right 09/10/2012    Dr Sharol Given  . Total hip arthroplasty Right 09/10/2012    Procedure: TOTAL HIP ARTHROPLASTY;  Surgeon: Newt Minion, MD;  Location: [facility];  Service: Orthopedics;  Laterality: Right;  Right Total Hip Arthroplasty    History   Social History  . Marital Status: Single    Spouse Name: N/A    Number of Children: N/A  . Years of Education: N/A   Occupational History  . Not on file.   Social History Main Topics  . Smoking status: Current Every Day Smoker -- 0.25 packs/day for 50 years    Types: Cigarettes  . Smokeless tobacco: Never Used  . Alcohol Use: No  . Drug Use: No  . Sexual Activity: No   Other Topics Concern  . Not on file   Social History Narrative  ROS: no fevers or chills, productive cough, hemoptysis, dysphasia, odynophagia, melena, hematochezia, dysuria, hematuria, rash, seizure activity, orthopnea, PND, pedal edema, claudication. Remaining systems are negative.  Physical Exam: Well-developed well-nourished in no acute distress.  Skin is warm and dry.  HEENT is normal.  Neck is supple.  Chest with diminished BS Cardiovascular exam is regular rate and rhythm.  Abdominal exam nontender or distended. No masses palpated. Extremities show no edema. Diminished distal pulses neuro grossly intact  ECG normal sinus rhythm with no ST changes.

## 2014-07-16 ENCOUNTER — Encounter: Payer: Self-pay | Admitting: Cardiology

## 2014-07-16 ENCOUNTER — Ambulatory Visit (INDEPENDENT_AMBULATORY_CARE_PROVIDER_SITE_OTHER): Payer: Medicare Other | Admitting: Cardiology

## 2014-07-16 ENCOUNTER — Encounter: Payer: Self-pay | Admitting: *Deleted

## 2014-07-16 DIAGNOSIS — I2510 Atherosclerotic heart disease of native coronary artery without angina pectoris: Secondary | ICD-10-CM

## 2014-07-16 DIAGNOSIS — I739 Peripheral vascular disease, unspecified: Secondary | ICD-10-CM

## 2014-07-16 DIAGNOSIS — I10 Essential (primary) hypertension: Secondary | ICD-10-CM

## 2014-07-16 DIAGNOSIS — I679 Cerebrovascular disease, unspecified: Secondary | ICD-10-CM | POA: Diagnosis not present

## 2014-07-16 DIAGNOSIS — I6529 Occlusion and stenosis of unspecified carotid artery: Secondary | ICD-10-CM

## 2014-07-16 NOTE — Assessment & Plan Note (Signed)
Patient counseled on discontinuing. 

## 2014-07-16 NOTE — Patient Instructions (Signed)
Your physician wants you to follow-up in: [place] will receive a reminder letter in the mail two months in advance. If you don't receive a letter, please call our office to schedule the follow-up appointment.   Your physician has requested that you have a lexiscan myoview. For further information please visit [email or web address]. Please follow instruction sheet, as given.   Your physician recommends that you return for lab work WITH STRESS TEST

## 2014-07-16 NOTE — Assessment & Plan Note (Signed)
Continue statin. Check lipids and liver. 

## 2014-07-16 NOTE — Assessment & Plan Note (Signed)
Continue aspirin and statin. No claudication. We will most likely check ABIs with Doppler when we see her in one year.

## 2014-07-16 NOTE — Assessment & Plan Note (Signed)
Blood pressure controlled. Continue present medications. Check potassium and renal function. 

## 2014-07-16 NOTE — Assessment & Plan Note (Signed)
Continue aspirin and statin. Follow-up carotid Dopplers December 2016.

## 2014-07-16 NOTE — Assessment & Plan Note (Signed)
Continue aspirin and statin. Schedule study for risk stratification.

## 2014-07-28 ENCOUNTER — Telehealth: Payer: Self-pay | Admitting: Cardiology

## 2014-07-28 ENCOUNTER — Other Ambulatory Visit: Payer: Self-pay | Admitting: *Deleted

## 2014-07-28 NOTE — Telephone Encounter (Deleted)
Error

## 2014-07-28 NOTE — Telephone Encounter (Signed)
Pt need a new prescription for Carvedilol please. Please call today if possible.

## 2014-07-28 NOTE — Telephone Encounter (Signed)
Patient requesting refill to [facility] in [place], a new pharmacy that isn't in our system yet. Advised pharmacist Ed that we had sent in a new refill to [facility] on [facility]/23[facility]2014 if he would like to transfer those instead. He was agreeable.

## 2014-08-05 ENCOUNTER — Telehealth (HOSPITAL_COMMUNITY): Payer: Self-pay

## 2014-08-05 NOTE — Telephone Encounter (Signed)
Encounter complete. 

## 2014-08-06 ENCOUNTER — Telehealth (HOSPITAL_COMMUNITY): Payer: Self-pay

## 2014-08-06 NOTE — Telephone Encounter (Signed)
Encounter complete. 

## 2014-08-10 ENCOUNTER — Ambulatory Visit (HOSPITAL_COMMUNITY)
Admission: RE | Admit: 2014-08-10 | Discharge: 2014-08-10 | Disposition: A | Discharge status: Home / Self Care | Payer: Medicare Other | Source: Ambulatory Visit | Attending: Cardiovascular Disease | Admitting: Cardiovascular Disease

## 2014-08-10 DIAGNOSIS — E785 Hyperlipidemia, unspecified: Secondary | ICD-10-CM | POA: Insufficient documentation

## 2014-08-10 DIAGNOSIS — R0609 Other forms of dyspnea: Secondary | ICD-10-CM | POA: Diagnosis not present

## 2014-08-10 DIAGNOSIS — R5383 Other fatigue: Secondary | ICD-10-CM | POA: Insufficient documentation

## 2014-08-10 DIAGNOSIS — I2510 Atherosclerotic heart disease of native coronary artery without angina pectoris: Secondary | ICD-10-CM

## 2014-08-10 DIAGNOSIS — I10 Essential (primary) hypertension: Secondary | ICD-10-CM | POA: Diagnosis not present

## 2014-08-10 DIAGNOSIS — I739 Peripheral vascular disease, unspecified: Secondary | ICD-10-CM | POA: Insufficient documentation

## 2014-08-10 DIAGNOSIS — I679 Cerebrovascular disease, unspecified: Secondary | ICD-10-CM | POA: Diagnosis not present

## 2014-08-10 DIAGNOSIS — R002 Palpitations: Secondary | ICD-10-CM | POA: Insufficient documentation

## 2014-08-10 MED ORDER — TECHNETIUM TC 99M SESTAMIBI GENERIC - CARDIOLITE
31.7000 | Freq: Once | INTRAVENOUS | Status: AC | PRN
  Administered 2014-08-10: 31.7 via INTRAVENOUS

## 2014-08-10 MED ORDER — TECHNETIUM TC 99M SESTAMIBI GENERIC - CARDIOLITE
10.4000 | Freq: Once | INTRAVENOUS | Status: AC | PRN
  Administered 2014-08-10: 10 via INTRAVENOUS

## 2014-08-10 MED ORDER — AMINOPHYLLINE 25 MG/ML IV SOLN
75.0000 mg | Freq: Once | INTRAVENOUS | Status: AC
  Administered 2014-08-10: 75 mg via INTRAVENOUS

## 2014-08-10 MED ORDER — REGADENOSON 0.4 MG/5ML IV SOLN
0.4000 mg | Freq: Once | INTRAVENOUS | Status: AC
  Administered 2014-08-10: 0.4 mg via INTRAVENOUS

## 2014-08-10 NOTE — Procedures (Addendum)
[facility] [address] [place] [address] [phone]  Cardiology Nuclear Med Study  [name] is a 74 y.o. female     MRN : [id number]     DOB: 06-Feb-1941  Procedure Date: 08/10/2014  Nuclear Med Background Indication for Stress Test:  Follow up CAD History:  CAD;cerebrovascular disease;Last NUC MPI on 07/02/2011-normal;EF=75%;PTCA's Cardiac Risk Factors: Carotid Disease, Hypertension, Lipids and PVD  Symptoms:  DOE, Fatigue and Palpitations   Nuclear Pre-Procedure Caffeine/Decaff Intake:  9:00pm NPO After: 7:00am   IV Site: R Forearm  IV 0.9% NS with Angio Cath:  22g  Chest Size (in):  n/a IV Started by: Rolene Course, RN  Height: 5\' 3"  (1.6 m)  Cup Size: C  BMI:  Body mass index is 24.45 kg/(m^2). Weight:  138 lb (62.596 kg)   Tech Comments:  n/a    Nuclear Med Study 1 or 2 day study: 1 day  Stress Test Type:  [place] Provider:  Kirk Ruths, MD   Resting Radionuclide: Technetium 81m Sestamibi  Resting Radionuclide Dose: 10.4 mCi   Stress Radionuclide:  Technetium 55m Sestamibi  Stress Radionuclide Dose: 31.7 mCi           Stress Protocol Rest HR: 68 Stress HR: 71  Rest BP: 134/68 Stress BP: 140/52  Exercise Time (min): n/a METS: n/a   Predicted Max HR: 147 bpm % Max HR: 52.38 bpm Rate Pressure Product: 10780  Dose of Adenosine (mg):  n/a Dose of Lexiscan: 0.4 mg  Dose of Atropine (mg): n/a Dose of Dobutamine: n/a mcg/kg/min (at max HR)  Stress Test Technologist: Leane Para, CCT Nuclear Technologist: Imagene Riches, CNMT   Rest Procedure:  Myocardial perfusion imaging was performed at rest 45 minutes following the intravenous administration of Technetium 64m Sestamibi. Stress Procedure:  The patient received IV Lexiscan 0.4 mg over 15-seconds.  Technetium 45m Sestamibi injected IV at 30-seconds.  Patient experienced SOB and 75 mg Aminophylline IV was administered.   There were no significant changes with Lexiscan.  Quantitative spect images were obtained after a 45 minute delay.  Transient Ischemic Dilatation (Normal <1.22):  1.19  QGS EDV:  68 ml QGS ESV:  22 ml LV Ejection Fraction: 68%    Rest ECG: NSR - Normal EKG  Stress ECG: No significant change from baseline ECG and There are scattered PVCs.  QPS Raw Data Images:  Normal; no motion artifact; normal heart/lung ratio. Stress Images:  Normal homogeneous uptake in all areas of the myocardium. Rest Images:  Normal homogeneous uptake in all areas of the myocardium. Subtraction (SDS):  Normal  Impression Exercise Capacity:  Lexiscan with no exercise. BP Response:  Normal blood pressure response. Clinical Symptoms:  mild shortness of breath ECG Impression:  No significant ST segment change suggestive of ischemia. Comparison with Prior Nuclear Study: No images to compare  Overall Impression:  Normal stress nuclear study.  LV Wall Motion:  NL LV Function, EF 68%; NL Wall Motion   [name] A, MD  08/10/2014 1:51 PM

## 2014-08-11 ENCOUNTER — Encounter: Payer: Self-pay | Admitting: Cardiology

## 2014-08-11 NOTE — Telephone Encounter (Signed)
Pt is returning Debra's call in reference to some results to her Echo. Please call  Thanks

## 2014-08-11 NOTE — Telephone Encounter (Signed)
This encounter was created in error - please disregard.

## 2014-08-27 ENCOUNTER — Other Ambulatory Visit: Payer: Self-pay | Admitting: *Deleted

## 2014-08-27 MED ORDER — HYDROCHLOROTHIAZIDE 12.5 MG PO CAPS: ORAL_CAPSULE | ORAL | Status: DC

## 2014-08-27 MED ORDER — AMLODIPINE BESYLATE 10 MG PO TABS: ORAL_TABLET | ORAL | Status: DC

## 2014-08-27 MED ORDER — LISINOPRIL 40 MG PO TABS: ORAL_TABLET | ORAL | Status: DC

## 2014-08-27 MED ORDER — CLONIDINE HCL 0.1 MG PO TABS: 0.1000 mg | ORAL_TABLET | Freq: Two times a day (BID) | ORAL | Status: DC

## 2014-11-01 ENCOUNTER — Other Ambulatory Visit: Payer: Self-pay | Admitting: *Deleted

## 2014-11-01 MED ORDER — ATORVASTATIN CALCIUM 40 MG PO TABS: ORAL_TABLET | ORAL | Status: DC

## 2014-11-04 DIAGNOSIS — H2511 Age-related nuclear cataract, right eye: Secondary | ICD-10-CM | POA: Diagnosis not present

## 2014-11-04 DIAGNOSIS — H43813 Vitreous degeneration, bilateral: Secondary | ICD-10-CM | POA: Diagnosis not present

## 2014-11-04 DIAGNOSIS — I10 Essential (primary) hypertension: Secondary | ICD-10-CM | POA: Diagnosis not present

## 2014-11-04 DIAGNOSIS — H25011 Cortical age-related cataract, right eye: Secondary | ICD-10-CM | POA: Diagnosis not present

## 2014-11-08 ENCOUNTER — Telehealth: Payer: Self-pay | Admitting: *Deleted

## 2014-11-08 NOTE — Telephone Encounter (Signed)
Ok for surgery Brian Crenshaw  

## 2014-11-08 NOTE — Telephone Encounter (Signed)
This note will be faxed to the number provided. Will forward to medical records for additional information requested to be faxed.

## 2014-11-08 NOTE — Telephone Encounter (Signed)
Patient needs medical clearance for CATARACT EXTRACTION WITH INTRAOCULAR LENS IMPLANTATION OF THE RIGHT EYE FOLLOWED BY THE LEFT EYE Will forward for dr [name] review

## 2014-11-10 ENCOUNTER — Telehealth: Payer: Self-pay | Admitting: Cardiology

## 2014-11-10 NOTE — Telephone Encounter (Signed)
She says she needs a yes or no for surgery.Forms were received,but no answer.

## 2014-11-10 NOTE — Telephone Encounter (Signed)
Left detailed message for [name], will refax the telephone note from 11-08-14.

## 2014-11-18 DIAGNOSIS — H25812 Combined forms of age-related cataract, left eye: Secondary | ICD-10-CM | POA: Diagnosis not present

## 2014-11-18 DIAGNOSIS — H2511 Age-related nuclear cataract, right eye: Secondary | ICD-10-CM | POA: Diagnosis not present

## 2014-11-18 DIAGNOSIS — H2513 Age-related nuclear cataract, bilateral: Secondary | ICD-10-CM | POA: Diagnosis not present

## 2014-11-19 DIAGNOSIS — H2512 Age-related nuclear cataract, left eye: Secondary | ICD-10-CM | POA: Diagnosis not present

## 2014-11-22 DIAGNOSIS — M1712 Unilateral primary osteoarthritis, left knee: Secondary | ICD-10-CM | POA: Diagnosis not present

## 2014-11-22 DIAGNOSIS — M25562 Pain in left knee: Secondary | ICD-10-CM | POA: Diagnosis not present

## 2014-12-06 DIAGNOSIS — M1712 Unilateral primary osteoarthritis, left knee: Secondary | ICD-10-CM | POA: Diagnosis not present

## 2014-12-07 ENCOUNTER — Other Ambulatory Visit: Payer: Self-pay

## 2014-12-07 MED ORDER — ATORVASTATIN CALCIUM 40 MG PO TABS: ORAL_TABLET | ORAL | Status: DC

## 2014-12-16 DIAGNOSIS — H2512 Age-related nuclear cataract, left eye: Secondary | ICD-10-CM | POA: Diagnosis not present

## 2014-12-16 DIAGNOSIS — H2513 Age-related nuclear cataract, bilateral: Secondary | ICD-10-CM | POA: Diagnosis not present

## 2014-12-16 DIAGNOSIS — H25812 Combined forms of age-related cataract, left eye: Secondary | ICD-10-CM | POA: Diagnosis not present

## 2015-01-17 ENCOUNTER — Other Ambulatory Visit: Payer: Self-pay | Admitting: *Deleted

## 2015-01-17 MED ORDER — CARVEDILOL 6.25 MG PO TABS: ORAL_TABLET | ORAL | Status: DC

## 2015-01-17 NOTE — Telephone Encounter (Signed)
ERX sent to pharmacy 

## 2015-04-15 DIAGNOSIS — Z23 Encounter for immunization: Secondary | ICD-10-CM | POA: Diagnosis not present

## 2015-05-10 ENCOUNTER — Telehealth: Payer: Self-pay | Admitting: Cardiology

## 2015-05-10 ENCOUNTER — Encounter: Payer: Self-pay | Admitting: Cardiology

## 2015-05-12 NOTE — Telephone Encounter (Signed)
Close encounter 

## 2015-05-24 DIAGNOSIS — M25562 Pain in left knee: Secondary | ICD-10-CM | POA: Diagnosis not present

## 2015-05-24 DIAGNOSIS — M25532 Pain in left wrist: Secondary | ICD-10-CM | POA: Diagnosis not present

## 2015-05-24 DIAGNOSIS — M25462 Effusion, left knee: Secondary | ICD-10-CM | POA: Diagnosis not present

## 2015-06-09 ENCOUNTER — Other Ambulatory Visit: Payer: Self-pay | Admitting: Cardiology

## 2015-06-09 DIAGNOSIS — I6523 Occlusion and stenosis of bilateral carotid arteries: Secondary | ICD-10-CM

## 2015-06-15 ENCOUNTER — Ambulatory Visit (HOSPITAL_COMMUNITY)
Admission: RE | Admit: 2015-06-15 | Discharge: 2015-06-15 | Disposition: A | Discharge status: Home / Self Care | Payer: Medicare Other | Source: Ambulatory Visit | Attending: Cardiovascular Disease | Admitting: Cardiovascular Disease

## 2015-06-15 DIAGNOSIS — I739 Peripheral vascular disease, unspecified: Secondary | ICD-10-CM | POA: Insufficient documentation

## 2015-06-15 DIAGNOSIS — E785 Hyperlipidemia, unspecified: Secondary | ICD-10-CM | POA: Diagnosis not present

## 2015-06-15 DIAGNOSIS — I10 Essential (primary) hypertension: Secondary | ICD-10-CM | POA: Insufficient documentation

## 2015-06-15 DIAGNOSIS — I6523 Occlusion and stenosis of bilateral carotid arteries: Secondary | ICD-10-CM | POA: Diagnosis not present

## 2015-07-04 ENCOUNTER — Encounter: Payer: Self-pay | Admitting: Gastroenterology

## 2015-07-05 DIAGNOSIS — M25532 Pain in left wrist: Secondary | ICD-10-CM | POA: Diagnosis not present

## 2015-07-05 DIAGNOSIS — M1712 Unilateral primary osteoarthritis, left knee: Secondary | ICD-10-CM | POA: Diagnosis not present

## 2015-07-11 ENCOUNTER — Other Ambulatory Visit: Payer: Self-pay | Admitting: *Deleted

## 2015-07-11 ENCOUNTER — Other Ambulatory Visit: Payer: Self-pay

## 2015-07-11 MED ORDER — CARVEDILOL 6.25 MG PO TABS: ORAL_TABLET | ORAL | Status: DC

## 2015-07-18 ENCOUNTER — Ambulatory Visit: Payer: Medicare Other | Admitting: Cardiology

## 2015-07-20 NOTE — Progress Notes (Signed)
HPI: FU coronary artery disease. She is status post PCI of her circumflex and right coronary artery with drug-eluting stents in August 2008. Abdominal ultrasound in January 2014 showed moderate right and severe left common iliac stenosis. ABIs in January of 2014 were normal. Patient seen by Dr Fletcher Anon and medical therapy recommended. Nuclear study 1/16 showed EF 68 and normal perfusion. Last carotid Dopplers were performed in Nov 2016 and showed 40-59% left stenosis and 1-39% right and followup was recommended in 12 months. Since I last saw her in Dec 2015,   Current Outpatient Prescriptions  Medication Sig Dispense Refill  . amLODipine (NORVASC) 10 MG tablet TAKE 1 TABLET (10 MG TOTAL) BY MOUTH DAILY. 90 tablet 3  . aspirin 81 MG tablet Take 81 mg by mouth daily.      [name] atorvastatin (LIPITOR) 40 MG tablet TAKE ONE TABLET BY MOUTH DAILY. 90 tablet 1  . carvedilol (COREG) 6.25 MG tablet TAKE 1 TABLET (6.25 MG TOTAL) BY MOUTH 2 (TWO) TIMES DAILY WITH A MEAL. KEEP APPT FOR REFILLS 60 tablet 3  . cloNIDine (CATAPRES) 0.1 MG tablet Take 1 tablet (0.1 mg total) by mouth 2 (two) times daily. 60 tablet 11  . diclofenac (VOLTAREN) 75 MG EC tablet Take 75 mg by mouth 2 (two) times daily.     [name] FLUZONE HIGH-DOSE 0.5 ML SUSY   0  . Glucosamine-Chondroit-Vit C-Mn (GLUCOSAMINE-CHONDROITIN) CAPS Take 1 capsule by mouth daily.    . hydrochlorothiazide (MICROZIDE) 12.5 MG capsule TAKE 1 CAPSULE (12.5 MG TOTAL) BY MOUTH EVERY MORNING. 90 capsule 3  . lisinopril (PRINIVIL,ZESTRIL) 40 MG tablet TAKE 1 TABLET (40 MG TOTAL) BY MOUTH DAILY. 90 tablet 3  . Multiple Vitamin (MULITIVITAMIN WITH MINERALS) TABS Take 1 tablet by mouth daily.     No current facility-administered medications for this visit.     Past Medical History  Diagnosis Date  . CAD (coronary artery disease)   . PVD (peripheral vascular disease)   . HTN (hypertension)   . Hyperlipidemia   . Cerebrovascular disease   . Arthritis     Past  Surgical History  Procedure Laterality Date  . Knee surgery    . Abdominal hysterectomy    . Coronary angioplasty      3 stents placed  . Total hip arthroplasty Right 09/10/2012    Dr Sharol Given  . Total hip arthroplasty Right 09/10/2012    Procedure: TOTAL HIP ARTHROPLASTY;  Surgeon: Newt Minion, MD;  Location: [facility];  Service: Orthopedics;  Laterality: Right;  Right Total Hip Arthroplasty    Social History   Social History  . Marital Status: Single    Spouse Name: N/A  . Number of Children: N/A  . Years of Education: N/A   Occupational History  . Not on file.   Social History Main Topics  . Smoking status: Current Every Day Smoker -- 0.25 packs/day for 50 years    Types: Cigarettes  . Smokeless tobacco: Never Used  . Alcohol Use: No  . Drug Use: No  . Sexual Activity: No   Other Topics Concern  . Not on file   Social History Narrative    ROS: no fevers or chills, productive cough, hemoptysis, dysphasia, odynophagia, melena, hematochezia, dysuria, hematuria, rash, seizure activity, orthopnea, PND, pedal edema, claudication. Remaining systems are negative.  Physical Exam: Well-developed well-nourished in no acute distress.  Skin is warm and dry.  HEENT is normal.  Neck is supple.  Chest is clear to  auscultation with normal expansion.  Cardiovascular exam is regular rate and rhythm.  Abdominal exam nontender or distended. No masses palpated. Extremities show no edema. neuro grossly intact  ECG     This encounter was created in error - please disregard.

## 2015-07-26 ENCOUNTER — Encounter: Payer: Medicare Other | Admitting: Cardiology

## 2015-08-08 NOTE — Progress Notes (Signed)
HPI: FU coronary artery disease. She is status post PCI of her circumflex and right coronary artery with drug-eluting stents in August 2008. Abdominal ultrasound in January 2014 showed moderate right and severe left common iliac stenosis. ABIs in January of 2014 were normal. Patient seen by Dr Fletcher Anon and medical therapy recommended for now. Last carotid Dopplers were performed in Nov 2016. There was 40-59% left stenosis and 1-39% right and followup was recommended in 12 months. Nuclear study January 2016 showed ejection fraction 68% and normal perfusion. Since I last saw her,   Current Outpatient Prescriptions  Medication Sig Dispense Refill  . amLODipine (NORVASC) 10 MG tablet TAKE 1 TABLET (10 MG TOTAL) BY MOUTH DAILY. 90 tablet 3  . aspirin 81 MG tablet Take 81 mg by mouth daily.      [name] atorvastatin (LIPITOR) 40 MG tablet TAKE ONE TABLET BY MOUTH DAILY. 90 tablet 1  . carvedilol (COREG) 6.25 MG tablet TAKE 1 TABLET (6.25 MG TOTAL) BY MOUTH 2 (TWO) TIMES DAILY WITH A MEAL. KEEP APPT FOR REFILLS 60 tablet 3  . cloNIDine (CATAPRES) 0.1 MG tablet Take 1 tablet (0.1 mg total) by mouth 2 (two) times daily. 60 tablet 11  . diclofenac (VOLTAREN) 75 MG EC tablet Take 75 mg by mouth 2 (two) times daily.     [name] FLUZONE HIGH-DOSE 0.5 ML SUSY   0  . Glucosamine-Chondroit-Vit C-Mn (GLUCOSAMINE-CHONDROITIN) CAPS Take 1 capsule by mouth daily.    . hydrochlorothiazide (MICROZIDE) 12.5 MG capsule TAKE 1 CAPSULE (12.5 MG TOTAL) BY MOUTH EVERY MORNING. 90 capsule 3  . lisinopril (PRINIVIL,ZESTRIL) 40 MG tablet TAKE 1 TABLET (40 MG TOTAL) BY MOUTH DAILY. 90 tablet 3  . Multiple Vitamin (MULITIVITAMIN WITH MINERALS) TABS Take 1 tablet by mouth daily.     No current facility-administered medications for this visit.     Past Medical History  Diagnosis Date  . CAD (coronary artery disease)   . PVD (peripheral vascular disease)   . HTN (hypertension)   . Hyperlipidemia   . Cerebrovascular disease   .  Arthritis     Past Surgical History  Procedure Laterality Date  . Knee surgery    . Abdominal hysterectomy    . Coronary angioplasty      3 stents placed  . Total hip arthroplasty Right 09/10/2012    Dr Sharol Given  . Total hip arthroplasty Right 09/10/2012    Procedure: TOTAL HIP ARTHROPLASTY;  Surgeon: Newt Minion, MD;  Location: [facility];  Service: Orthopedics;  Laterality: Right;  Right Total Hip Arthroplasty    Social History   Social History  . Marital Status: Single    Spouse Name: N/A  . Number of Children: N/A  . Years of Education: N/A   Occupational History  . Not on file.   Social History Main Topics  . Smoking status: Current Every Day Smoker -- 0.25 packs/day for 50 years    Types: Cigarettes  . Smokeless tobacco: Never Used  . Alcohol Use: No  . Drug Use: No  . Sexual Activity: No   Other Topics Concern  . Not on file   Social History Narrative    Family History  Problem Relation Age of Onset  . Breast cancer Mother   . Leukemia Father   . Stroke Sister   . Bone cancer      ROS: no fevers or chills, productive cough, hemoptysis, dysphasia, odynophagia, melena, hematochezia, dysuria, hematuria, rash, seizure activity, orthopnea, PND, pedal  edema, claudication. Remaining systems are negative.  Physical Exam: Well-developed well-nourished in no acute distress.  Skin is warm and dry.  HEENT is normal.  Neck is supple.  Chest is clear to auscultation with normal expansion.  Cardiovascular exam is regular rate and rhythm.  Abdominal exam nontender or distended. No masses palpated. Extremities show no edema. neuro grossly intact  ECG     This encounter was created in error - please disregard.

## 2015-08-10 DIAGNOSIS — M1189 Other specified crystal arthropathies, multiple sites: Secondary | ICD-10-CM | POA: Diagnosis not present

## 2015-08-10 DIAGNOSIS — M25532 Pain in left wrist: Secondary | ICD-10-CM | POA: Diagnosis not present

## 2015-08-10 DIAGNOSIS — M1711 Unilateral primary osteoarthritis, right knee: Secondary | ICD-10-CM | POA: Diagnosis not present

## 2015-08-10 DIAGNOSIS — M170 Bilateral primary osteoarthritis of knee: Secondary | ICD-10-CM | POA: Diagnosis not present

## 2015-08-10 DIAGNOSIS — M25562 Pain in left knee: Secondary | ICD-10-CM | POA: Diagnosis not present

## 2015-08-12 ENCOUNTER — Encounter: Payer: Medicare Other | Admitting: Cardiology

## 2015-08-26 ENCOUNTER — Other Ambulatory Visit: Payer: Self-pay | Admitting: *Deleted

## 2015-08-26 MED ORDER — CLONIDINE HCL 0.1 MG PO TABS: 0.1000 mg | ORAL_TABLET | Freq: Two times a day (BID) | ORAL | Status: DC

## 2015-09-01 ENCOUNTER — Telehealth: Payer: Self-pay | Admitting: Cardiology

## 2015-09-01 MED ORDER — CARVEDILOL 6.25 MG PO TABS: ORAL_TABLET | ORAL | Status: DC

## 2015-09-01 NOTE — Telephone Encounter (Signed)
New message       *STAT* If patient is at the pharmacy, call can be transferred to refill team.   1. Which medications need to be refilled? (please list name of each medication and dose if known) carvedilol 6.25 2. Which pharmacy/location (including street and city if local pharmacy) is medication to be sent to? [facility] [address] 3. Do they need a 30 day or 90 day supply? 30 day

## 2015-09-01 NOTE — Telephone Encounter (Signed)
Refill sent.

## 2015-09-12 DIAGNOSIS — M170 Bilateral primary osteoarthritis of knee: Secondary | ICD-10-CM | POA: Diagnosis not present

## 2015-09-12 DIAGNOSIS — Z87891 Personal history of nicotine dependence: Secondary | ICD-10-CM | POA: Diagnosis not present

## 2015-09-12 DIAGNOSIS — M1189 Other specified crystal arthropathies, multiple sites: Secondary | ICD-10-CM | POA: Diagnosis not present

## 2015-09-15 ENCOUNTER — Other Ambulatory Visit: Payer: Self-pay | Admitting: Cardiology

## 2015-09-15 ENCOUNTER — Other Ambulatory Visit: Payer: Self-pay | Admitting: Orthopedic Surgery

## 2015-09-15 MED ORDER — ATORVASTATIN CALCIUM 40 MG PO TABS: ORAL_TABLET | ORAL | Status: DC

## 2015-09-20 ENCOUNTER — Encounter (HOSPITAL_COMMUNITY)
Admission: RE | Admit: 2015-09-20 | Discharge: 2015-09-20 | Disposition: A | Discharge status: Home / Self Care | Payer: Medicare Other | Source: Ambulatory Visit | Attending: Orthopedic Surgery | Admitting: Orthopedic Surgery

## 2015-09-20 ENCOUNTER — Encounter (HOSPITAL_COMMUNITY): Payer: Self-pay

## 2015-09-20 DIAGNOSIS — Z01818 Encounter for other preprocedural examination: Secondary | ICD-10-CM | POA: Diagnosis not present

## 2015-09-20 DIAGNOSIS — I10 Essential (primary) hypertension: Secondary | ICD-10-CM | POA: Insufficient documentation

## 2015-09-20 DIAGNOSIS — Z79899 Other long term (current) drug therapy: Secondary | ICD-10-CM | POA: Diagnosis not present

## 2015-09-20 DIAGNOSIS — Z96641 Presence of right artificial hip joint: Secondary | ICD-10-CM | POA: Insufficient documentation

## 2015-09-20 DIAGNOSIS — Z7982 Long term (current) use of aspirin: Secondary | ICD-10-CM | POA: Insufficient documentation

## 2015-09-20 DIAGNOSIS — I2510 Atherosclerotic heart disease of native coronary artery without angina pectoris: Secondary | ICD-10-CM | POA: Diagnosis not present

## 2015-09-20 DIAGNOSIS — M1712 Unilateral primary osteoarthritis, left knee: Secondary | ICD-10-CM | POA: Insufficient documentation

## 2015-09-20 DIAGNOSIS — Z955 Presence of coronary angioplasty implant and graft: Secondary | ICD-10-CM | POA: Insufficient documentation

## 2015-09-20 DIAGNOSIS — Z01812 Encounter for preprocedural laboratory examination: Secondary | ICD-10-CM | POA: Insufficient documentation

## 2015-09-20 DIAGNOSIS — F17200 Nicotine dependence, unspecified, uncomplicated: Secondary | ICD-10-CM | POA: Insufficient documentation

## 2015-09-20 DIAGNOSIS — I679 Cerebrovascular disease, unspecified: Secondary | ICD-10-CM | POA: Insufficient documentation

## 2015-09-20 DIAGNOSIS — M1710 Unilateral primary osteoarthritis, unspecified knee: Secondary | ICD-10-CM

## 2015-09-20 DIAGNOSIS — E785 Hyperlipidemia, unspecified: Secondary | ICD-10-CM | POA: Diagnosis not present

## 2015-09-20 DIAGNOSIS — Z86718 Personal history of other venous thrombosis and embolism: Secondary | ICD-10-CM | POA: Diagnosis not present

## 2015-09-20 DIAGNOSIS — M8580 Other specified disorders of bone density and structure, unspecified site: Secondary | ICD-10-CM | POA: Diagnosis not present

## 2015-09-20 DIAGNOSIS — M179 Osteoarthritis of knee, unspecified: Secondary | ICD-10-CM

## 2015-09-20 DIAGNOSIS — I739 Peripheral vascular disease, unspecified: Secondary | ICD-10-CM | POA: Insufficient documentation

## 2015-09-20 LAB — CBC
HEMATOCRIT: 38.1 % (ref 36.0–46.0)
Hemoglobin: 12.5 g/dL (ref 12.0–15.0)
MCH: 29.6 pg (ref 26.0–34.0)
MCHC: 32.8 g/dL (ref 30.0–36.0)
MCV: 90.3 fL (ref 78.0–100.0)
Platelets: 434 10*3/uL — ABNORMAL HIGH (ref 150–400)
RBC: 4.22 MIL/uL (ref 3.87–5.11)
RDW: 13.9 % (ref 11.5–15.5)
WBC: 6.7 10*3/uL (ref 4.0–10.5)

## 2015-09-20 LAB — PROTIME-INR
INR: 1.11 (ref 0.00–1.49)
Prothrombin Time: 14.5 seconds (ref 11.6–15.2)

## 2015-09-20 LAB — COMPREHENSIVE METABOLIC PANEL
ALBUMIN: 2.8 g/dL — AB (ref 3.5–5.0)
ALT: 18 U/L (ref 14–54)
AST: 22 U/L (ref 15–41)
Alkaline Phosphatase: 115 U/L (ref 38–126)
Anion gap: 10 (ref 5–15)
BILIRUBIN TOTAL: 0.3 mg/dL (ref 0.3–1.2)
BUN: 14 mg/dL (ref 6–20)
CHLORIDE: 96 mmol/L — AB (ref 101–111)
CO2: 29 mmol/L (ref 22–32)
CREATININE: 0.97 mg/dL (ref 0.44–1.00)
Calcium: 10.6 mg/dL — ABNORMAL HIGH (ref 8.9–10.3)
GFR calc Af Amer: >60 mL/min (ref >60)
GFR, EST NON AFRICAN AMERICAN: 56 mL/min — AB (ref >60)
GLUCOSE: 132 mg/dL — AB (ref 65–99)
POTASSIUM: 4 mmol/L (ref 3.5–5.1)
Sodium: 135 mmol/L (ref 135–145)
TOTAL PROTEIN: 7.4 g/dL (ref 6.5–8.1)

## 2015-09-20 LAB — SURGICAL PCR SCREEN
MRSA, PCR: NEGATIVE
Staphylococcus aureus: NEGATIVE

## 2015-09-20 LAB — APTT: APTT: 37 s (ref 24–37)

## 2015-09-20 NOTE — Pre-Procedure Instructions (Addendum)
BONNA STEURY  09/20/2015      [id number] [place] [facility] [id number]: [phone]: [phone] [place] [address]: [phone]: [phone]   Your procedure is scheduled on 09/28/15.  [facility]  Call this number if you have problems the morning of surgery:  [phone]   Remember:  Do not eat food or drink liquids after midnight.  Take these medicines the morning of surgery with A SIP OF WATER amlodipine(norvasc),carvedilol(coreg),clonidine(catapres), tramadol if needed   STOP all herbel meds, nsaids (aleve,naproxen,advil,ibuprofen) 5 days prior to surgery starting 09/23/15 including aspirin, voltaren,gluscosamine-chondroitin,vitamins(multi)   Do not wear jewelry, make-up or nail polish.  Do not wear lotions, powders, or perfumes.  You may wear deodorant.  Do not shave 48 hours prior to surgery.  Men may shave face and neck.  Do not bring valuables to the hospital.  [facility] any belongings or valuables.  Contacts, dentures or bridgework may not be worn into surgery.  Leave your suitcase in the car.  After surgery it may be brought to your room.  For patients admitted to the hospital, discharge time will be determined by your treatment team.  Patients discharged the day of surgery will not be allowed to drive home.   Name and phone number of your driver:    Special instructions:   Special Instructions: [place] - Preparing for Surgery  Before surgery, you can play an important role.  Because skin is not sterile, your skin needs to be as free of germs as possible.  You can reduce the number of germs on you skin by washing with CHG (chlorahexidine gluconate) soap before surgery.  CHG is an antiseptic cleaner which  kills germs and bonds with the skin to continue killing germs even after washing.  Please DO NOT use if you have an allergy to CHG or antibacterial soaps.  If your skin becomes reddened/irritated stop using the CHG and inform your nurse when you arrive at Short Stay.  Do not shave (including legs and underarms) for at least 48 hours prior to the first CHG shower.  You may shave your face.  Please follow these instructions carefully:   1.  Shower with CHG Soap the night before surgery and the morning of Surgery.  2.  If you choose to wash your hair, wash your hair first as usual with your normal shampoo.  3.  After you shampoo, rinse your hair and body thoroughly to remove the Shampoo.  4.  Use CHG as you would any other liquid soap.  You can apply chg directly  to the skin and wash gently with scrungie or a clean washcloth.  5.  Apply the CHG Soap to your body ONLY FROM THE NECK DOWN.  Do not use on open wounds or open sores.  Avoid contact with your eyes ears, mouth and genitals (private parts).  Wash genitals (private parts)       with your normal soap.  6.  Wash thoroughly, paying special attention to the area where your surgery will be performed.  7.  Thoroughly rinse your body with warm water from the neck down.  8.  DO NOT shower/wash with your normal soap after using and rinsing off the CHG Soap.  9.  Pat yourself dry with a clean towel.            10.  Wear clean pajamas.            11.  Place clean sheets on your bed the night of your first shower and do not sleep with pets.  Day of Surgery  Do not apply any lotions/deodorants the morning of surgery.  Please wear clean clothes to the hospital/surgery center.  Please read over the following fact sheets that you were given. Pain Booklet, Coughing and Deep Breathing, Total Joint Packet, MRSA Information and Surgical Site Infection Prevention

## 2015-09-21 NOTE — Progress Notes (Signed)
Anesthesia Chart Review:  Pt is a 75 year old female scheduled for L total knee arthroplasty on 09/28/2015 with Dr. Sharol Given.   Cardiologist is Dr. Kirk Ruths, last office visit 07/16/14. Next appt scheduled for 10/13/15  PMH includes:  CAD (DES to RCA and CX 2008), HTN, cerebrovascular disease, hyperlipidemia, PVD, DVT. Current smoker. BMI 22. S/p R THA 09/10/12.   Medications include: amlodipine, ASA, lipitor, carvedilol, clonidine, hctz, lisinopril.   Preoperative labs reviewed.    Chest x-ray 09/20/15 reviewed. No active cardiopulmonary disease. Mild hyperinflation. Thoracic spine osteopenia.  EKG 09/20/15: NSR.   Nuclear stress test 08/10/14: Normal stress nuclear study. LV Wall Motion: NL LV Function, EF 68%; NL Wall Motion  Carotid duplex 06/15/15:  - Heterogeneous plaque, bilaterally. - Stable 1-39% RICA stenosis. - Stable 81-01% LICA stenosis. - >50% LECA stenosis. - Normal subclavian arteries, bilaterally. - Patent vertebral arteries with antegrade flow.  AA duplex 08/08/12:  - Normal caliber abdominal aorta, common and external iliac arteries, without dilatation - Moderate to severe aorto-iliac artherosclerosis, with moderate stenosis of the SMA - Moderate R common illiac artery stenosis - Severe L common iliac artery stenosis - Mild B external iliac artery stenosis  Reviewed case with Dr. Gifford Shave. Left voicemail for Malachy Mood in Dr. Jess Barters office about pt's L PAD from [place] duplex in 2014.   If no changes, I anticipate pt can proceed with surgery as scheduled.   Willeen Cass, FNP-BC [facility]/Anesthesiology Phone: [phone] 09/21/2015 11:54 AM

## 2015-09-28 ENCOUNTER — Encounter (HOSPITAL_COMMUNITY)
Admission: RE | Disposition: A | Discharge status: Skilled Nursing Facility | Payer: Self-pay | Source: Ambulatory Visit | Attending: Orthopedic Surgery

## 2015-09-28 ENCOUNTER — Encounter (HOSPITAL_COMMUNITY): Payer: Self-pay | Admitting: Certified Registered Nurse Anesthetist

## 2015-09-28 ENCOUNTER — Inpatient Hospital Stay (HOSPITAL_COMMUNITY)
Admission: RE | Admit: 2015-09-28 | Discharge: 2015-10-01 | DRG: 470 | Disposition: A | Discharge status: Skilled Nursing Facility | Payer: Medicare Other | Source: Ambulatory Visit | Attending: Orthopedic Surgery | Admitting: Orthopedic Surgery

## 2015-09-28 ENCOUNTER — Inpatient Hospital Stay (HOSPITAL_COMMUNITY): Payer: Medicare Other | Admitting: Emergency Medicine

## 2015-09-28 ENCOUNTER — Inpatient Hospital Stay (HOSPITAL_COMMUNITY): Payer: Medicare Other | Admitting: Certified Registered Nurse Anesthetist

## 2015-09-28 DIAGNOSIS — M1712 Unilateral primary osteoarthritis, left knee: Secondary | ICD-10-CM | POA: Diagnosis not present

## 2015-09-28 DIAGNOSIS — Z8673 Personal history of transient ischemic attack (TIA), and cerebral infarction without residual deficits: Secondary | ICD-10-CM | POA: Diagnosis not present

## 2015-09-28 DIAGNOSIS — Z96641 Presence of right artificial hip joint: Secondary | ICD-10-CM | POA: Diagnosis present

## 2015-09-28 DIAGNOSIS — Z7982 Long term (current) use of aspirin: Secondary | ICD-10-CM | POA: Diagnosis not present

## 2015-09-28 DIAGNOSIS — G8918 Other acute postprocedural pain: Secondary | ICD-10-CM | POA: Diagnosis not present

## 2015-09-28 DIAGNOSIS — I10 Essential (primary) hypertension: Secondary | ICD-10-CM | POA: Diagnosis present

## 2015-09-28 DIAGNOSIS — I739 Peripheral vascular disease, unspecified: Secondary | ICD-10-CM | POA: Diagnosis present

## 2015-09-28 DIAGNOSIS — M810 Age-related osteoporosis without current pathological fracture: Secondary | ICD-10-CM | POA: Diagnosis present

## 2015-09-28 DIAGNOSIS — I2510 Atherosclerotic heart disease of native coronary artery without angina pectoris: Secondary | ICD-10-CM | POA: Diagnosis present

## 2015-09-28 DIAGNOSIS — M1990 Unspecified osteoarthritis, unspecified site: Secondary | ICD-10-CM | POA: Diagnosis not present

## 2015-09-28 DIAGNOSIS — M179 Osteoarthritis of knee, unspecified: Secondary | ICD-10-CM | POA: Diagnosis not present

## 2015-09-28 DIAGNOSIS — M25562 Pain in left knee: Secondary | ICD-10-CM | POA: Diagnosis not present

## 2015-09-28 DIAGNOSIS — Z96659 Presence of unspecified artificial knee joint: Secondary | ICD-10-CM

## 2015-09-28 DIAGNOSIS — I714 Abdominal aortic aneurysm, without rupture: Secondary | ICD-10-CM | POA: Diagnosis not present

## 2015-09-28 DIAGNOSIS — I723 Aneurysm of iliac artery: Secondary | ICD-10-CM | POA: Diagnosis not present

## 2015-09-28 DIAGNOSIS — Z87891 Personal history of nicotine dependence: Secondary | ICD-10-CM | POA: Diagnosis not present

## 2015-09-28 DIAGNOSIS — E785 Hyperlipidemia, unspecified: Secondary | ICD-10-CM | POA: Diagnosis present

## 2015-09-28 DIAGNOSIS — F17210 Nicotine dependence, cigarettes, uncomplicated: Secondary | ICD-10-CM | POA: Diagnosis present

## 2015-09-28 DIAGNOSIS — M25569 Pain in unspecified knee: Secondary | ICD-10-CM | POA: Diagnosis not present

## 2015-09-28 DIAGNOSIS — J449 Chronic obstructive pulmonary disease, unspecified: Secondary | ICD-10-CM | POA: Diagnosis not present

## 2015-09-28 DIAGNOSIS — Z471 Aftercare following joint replacement surgery: Secondary | ICD-10-CM | POA: Diagnosis not present

## 2015-09-28 DIAGNOSIS — I679 Cerebrovascular disease, unspecified: Secondary | ICD-10-CM | POA: Diagnosis present

## 2015-09-28 DIAGNOSIS — Z96652 Presence of left artificial knee joint: Secondary | ICD-10-CM

## 2015-09-28 HISTORY — PX: TOTAL KNEE ARTHROPLASTY: SHX125

## 2015-09-28 SURGERY — ARTHROPLASTY, KNEE, TOTAL
Anesthesia: Regional | Laterality: Left

## 2015-09-28 MED ORDER — FENTANYL CITRATE (PF) 100 MCG/2ML IJ SOLN
INTRAMUSCULAR | Status: DC | PRN
  Administered 2015-09-28: 50 ug via INTRAVENOUS
  Administered 2015-09-28: 100 ug via INTRAVENOUS
  Administered 2015-09-28 (×3): 50 ug via INTRAVENOUS

## 2015-09-28 MED ORDER — DOCUSATE SODIUM 100 MG PO CAPS
100.0000 mg | ORAL_CAPSULE | Freq: Two times a day (BID) | ORAL | Status: DC
  Administered 2015-09-28 – 2015-10-01 (×6): 100 mg via ORAL
  Filled 2015-09-28 (×6): qty 1

## 2015-09-28 MED ORDER — CEFAZOLIN SODIUM 1-5 GM-% IV SOLN
1.0000 g | Freq: Four times a day (QID) | INTRAVENOUS | Status: AC
  Administered 2015-09-28 – 2015-09-29 (×2): 1 g via INTRAVENOUS
  Filled 2015-09-28 (×2): qty 50

## 2015-09-28 MED ORDER — LISINOPRIL 40 MG PO TABS
40.0000 mg | ORAL_TABLET | Freq: Every day | ORAL | Status: DC
  Administered 2015-09-29 – 2015-10-01 (×3): 40 mg via ORAL
  Filled 2015-09-28 (×3): qty 1

## 2015-09-28 MED ORDER — ROCURONIUM BROMIDE 50 MG/5ML IV SOLN
INTRAVENOUS | Status: AC
  Filled 2015-09-28: qty 1

## 2015-09-28 MED ORDER — POLYETHYLENE GLYCOL 3350 17 G PO PACK: 17.0000 g | PACK | Freq: Every day | ORAL | Status: DC | PRN

## 2015-09-28 MED ORDER — ONDANSETRON HCL 4 MG/2ML IJ SOLN
INTRAMUSCULAR | Status: AC
  Filled 2015-09-28: qty 2

## 2015-09-28 MED ORDER — LIDOCAINE HCL (CARDIAC) 20 MG/ML IV SOLN
INTRAVENOUS | Status: DC | PRN
  Administered 2015-09-28: 60 mg via INTRAVENOUS

## 2015-09-28 MED ORDER — ONDANSETRON HCL 4 MG/2ML IJ SOLN
INTRAMUSCULAR | Status: DC | PRN
  Administered 2015-09-28: 4 mg via INTRAVENOUS

## 2015-09-28 MED ORDER — BUPIVACAINE LIPOSOME 1.3 % IJ SUSP
INTRAMUSCULAR | Status: DC | PRN
  Administered 2015-09-28: 20 mL

## 2015-09-28 MED ORDER — LIDOCAINE HCL (CARDIAC) 20 MG/ML IV SOLN
INTRAVENOUS | Status: AC
  Filled 2015-09-28: qty 5

## 2015-09-28 MED ORDER — FENTANYL CITRATE (PF) 100 MCG/2ML IJ SOLN
25.0000 ug | Freq: Once | INTRAMUSCULAR | Status: AC
  Administered 2015-09-28: 25 ug via INTRAVENOUS

## 2015-09-28 MED ORDER — MENTHOL 3 MG MT LOZG: 1.0000 | LOZENGE | OROMUCOSAL | Status: DC | PRN

## 2015-09-28 MED ORDER — FENTANYL CITRATE (PF) 250 MCG/5ML IJ SOLN
INTRAMUSCULAR | Status: AC
  Filled 2015-09-28: qty 5

## 2015-09-28 MED ORDER — PROMETHAZINE HCL 25 MG/ML IJ SOLN: 6.2500 mg | INTRAMUSCULAR | Status: DC | PRN

## 2015-09-28 MED ORDER — LACTATED RINGERS IV SOLN
INTRAVENOUS | Status: DC
  Administered 2015-09-28 (×2): via INTRAVENOUS

## 2015-09-28 MED ORDER — HYDROMORPHONE HCL 1 MG/ML IJ SOLN
INTRAMUSCULAR | Status: AC
  Filled 2015-09-28: qty 1

## 2015-09-28 MED ORDER — AMLODIPINE BESYLATE 10 MG PO TABS
10.0000 mg | ORAL_TABLET | Freq: Every day | ORAL | Status: DC
  Administered 2015-09-29 – 2015-10-01 (×3): 10 mg via ORAL
  Filled 2015-09-28 (×3): qty 1

## 2015-09-28 MED ORDER — TRANEXAMIC ACID 1000 MG/10ML IV SOLN
2000.0000 mg | INTRAVENOUS | Status: DC | PRN
  Administered 2015-09-28: 2000 mg via TOPICAL

## 2015-09-28 MED ORDER — BUPIVACAINE LIPOSOME 1.3 % IJ SUSP
20.0000 mL | INTRAMUSCULAR | Status: DC
  Filled 2015-09-28: qty 20

## 2015-09-28 MED ORDER — HYDROCHLOROTHIAZIDE 12.5 MG PO CAPS
12.5000 mg | ORAL_CAPSULE | Freq: Every day | ORAL | Status: DC
Start: 2015-09-29 — End: 2015-10-01
  Administered 2015-09-29 – 2015-10-01 (×2): 12.5 mg via ORAL
  Filled 2015-09-28 (×3): qty 1

## 2015-09-28 MED ORDER — ASPIRIN EC 325 MG PO TBEC
325.0000 mg | DELAYED_RELEASE_TABLET | Freq: Every day | ORAL | Status: DC
  Administered 2015-09-29 – 2015-10-01 (×3): 325 mg via ORAL
  Filled 2015-09-28 (×3): qty 1

## 2015-09-28 MED ORDER — CARVEDILOL 6.25 MG PO TABS
6.2500 mg | ORAL_TABLET | Freq: Two times a day (BID) | ORAL | Status: DC
  Administered 2015-09-28 – 2015-10-01 (×5): 6.25 mg via ORAL
  Filled 2015-09-28 (×6): qty 1

## 2015-09-28 MED ORDER — ATORVASTATIN CALCIUM 40 MG PO TABS
40.0000 mg | ORAL_TABLET | Freq: Every day | ORAL | Status: DC
  Administered 2015-09-28 – 2015-09-30 (×3): 40 mg via ORAL
  Filled 2015-09-28 (×3): qty 1

## 2015-09-28 MED ORDER — ONDANSETRON HCL 4 MG/2ML IJ SOLN: 4.0000 mg | Freq: Four times a day (QID) | INTRAMUSCULAR | Status: DC | PRN

## 2015-09-28 MED ORDER — SODIUM CHLORIDE 0.9 % IV SOLN
INTRAVENOUS | Status: DC
  Administered 2015-09-29: 09:00:00 via INTRAVENOUS

## 2015-09-28 MED ORDER — ALUM & MAG HYDROXIDE-SIMETH 200-200-20 MG/5ML PO SUSP: 30.0000 mL | ORAL | Status: DC | PRN

## 2015-09-28 MED ORDER — PHENYLEPHRINE HCL 10 MG/ML IJ SOLN
INTRAMUSCULAR | Status: DC | PRN
  Administered 2015-09-28 (×2): 120 ug via INTRAVENOUS
  Administered 2015-09-28: 160 ug via INTRAVENOUS

## 2015-09-28 MED ORDER — SUGAMMADEX SODIUM 500 MG/5ML IV SOLN
INTRAVENOUS | Status: AC
  Filled 2015-09-28: qty 5

## 2015-09-28 MED ORDER — OXYCODONE HCL 5 MG PO TABS
5.0000 mg | ORAL_TABLET | ORAL | Status: DC | PRN
  Administered 2015-09-28 – 2015-09-29 (×3): 10 mg via ORAL
  Filled 2015-09-28 (×3): qty 2

## 2015-09-28 MED ORDER — ACETAMINOPHEN 650 MG RE SUPP: 650.0000 mg | Freq: Four times a day (QID) | RECTAL | Status: DC | PRN

## 2015-09-28 MED ORDER — SUGAMMADEX SODIUM 500 MG/5ML IV SOLN
INTRAVENOUS | Status: DC | PRN
  Administered 2015-09-28: 100 mg via INTRAVENOUS

## 2015-09-28 MED ORDER — SODIUM CHLORIDE 0.9 % IR SOLN
Status: DC | PRN
  Administered 2015-09-28: 1000 mL

## 2015-09-28 MED ORDER — BISACODYL 10 MG RE SUPP
10.0000 mg | Freq: Every day | RECTAL | Status: DC | PRN
  Administered 2015-10-01: 10 mg via RECTAL
  Filled 2015-09-28: qty 1

## 2015-09-28 MED ORDER — ESMOLOL HCL 100 MG/10ML IV SOLN
INTRAVENOUS | Status: DC | PRN
  Administered 2015-09-28: 30 mg via INTRAVENOUS
  Administered 2015-09-28: 20 mg via INTRAVENOUS

## 2015-09-28 MED ORDER — HYDROMORPHONE HCL 1 MG/ML IJ SOLN
0.2500 mg | INTRAMUSCULAR | Status: DC | PRN
  Administered 2015-09-28: 0.25 mg via INTRAVENOUS
  Administered 2015-09-28: 0.5 mg via INTRAVENOUS
  Administered 2015-09-28: 0.25 mg via INTRAVENOUS

## 2015-09-28 MED ORDER — ACETAMINOPHEN 325 MG PO TABS
650.0000 mg | ORAL_TABLET | Freq: Four times a day (QID) | ORAL | Status: DC | PRN
Start: 2015-09-28 — End: 2015-10-01
  Administered 2015-09-29: 650 mg via ORAL
  Filled 2015-09-28 (×2): qty 2

## 2015-09-28 MED ORDER — COLCHICINE 0.6 MG PO TABS: 0.6000 mg | ORAL_TABLET | Freq: Every day | ORAL | Status: DC | PRN

## 2015-09-28 MED ORDER — MIDAZOLAM HCL 2 MG/2ML IJ SOLN: 1.0000 mg | Freq: Once | INTRAMUSCULAR | Status: DC

## 2015-09-28 MED ORDER — CLONIDINE HCL 0.1 MG PO TABS
0.1000 mg | ORAL_TABLET | Freq: Two times a day (BID) | ORAL | Status: DC
  Administered 2015-09-28 – 2015-10-01 (×6): 0.1 mg via ORAL
  Filled 2015-09-28 (×6): qty 1

## 2015-09-28 MED ORDER — EPHEDRINE SULFATE 50 MG/ML IJ SOLN
INTRAMUSCULAR | Status: DC | PRN
  Administered 2015-09-28: 10 mg via INTRAVENOUS

## 2015-09-28 MED ORDER — CEFAZOLIN SODIUM-DEXTROSE 2-3 GM-% IV SOLR
2.0000 g | INTRAVENOUS | Status: AC
  Administered 2015-09-28: 2 g via INTRAVENOUS
  Filled 2015-09-28: qty 50

## 2015-09-28 MED ORDER — FENTANYL CITRATE (PF) 100 MCG/2ML IJ SOLN
INTRAMUSCULAR | Status: AC
  Filled 2015-09-28: qty 2

## 2015-09-28 MED ORDER — FERROUS SULFATE 325 (65 FE) MG PO TABS
325.0000 mg | ORAL_TABLET | Freq: Three times a day (TID) | ORAL | Status: DC
  Administered 2015-09-28 – 2015-10-01 (×8): 325 mg via ORAL
  Filled 2015-09-28 (×8): qty 1

## 2015-09-28 MED ORDER — CHLORHEXIDINE GLUCONATE 4 % EX LIQD: 60.0000 mL | Freq: Once | CUTANEOUS | Status: DC

## 2015-09-28 MED ORDER — MIDAZOLAM HCL 2 MG/2ML IJ SOLN
INTRAMUSCULAR | Status: AC
  Administered 2015-09-28: 1 mg via INTRAVENOUS
  Filled 2015-09-28: qty 2

## 2015-09-28 MED ORDER — 0.9 % SODIUM CHLORIDE (POUR BTL) OPTIME
TOPICAL | Status: DC | PRN
  Administered 2015-09-28: 1000 mL

## 2015-09-28 MED ORDER — ROCURONIUM BROMIDE 100 MG/10ML IV SOLN
INTRAVENOUS | Status: DC | PRN
  Administered 2015-09-28: 30 mg via INTRAVENOUS

## 2015-09-28 MED ORDER — METOCLOPRAMIDE HCL 5 MG/ML IJ SOLN: 5.0000 mg | Freq: Three times a day (TID) | INTRAMUSCULAR | Status: DC | PRN

## 2015-09-28 MED ORDER — PROPOFOL 10 MG/ML IV BOLUS
INTRAVENOUS | Status: DC | PRN
  Administered 2015-09-28: 120 mg via INTRAVENOUS

## 2015-09-28 MED ORDER — EPHEDRINE SULFATE 50 MG/ML IJ SOLN
INTRAMUSCULAR | Status: AC
  Filled 2015-09-28: qty 2

## 2015-09-28 MED ORDER — HYDROMORPHONE HCL 1 MG/ML IJ SOLN: 1.0000 mg | INTRAMUSCULAR | Status: DC | PRN

## 2015-09-28 MED ORDER — PHENOL 1.4 % MT LIQD: 1.0000 | OROMUCOSAL | Status: DC | PRN

## 2015-09-28 MED ORDER — ONDANSETRON HCL 4 MG PO TABS: 4.0000 mg | ORAL_TABLET | Freq: Four times a day (QID) | ORAL | Status: DC | PRN

## 2015-09-28 MED ORDER — METOCLOPRAMIDE HCL 5 MG PO TABS: 5.0000 mg | ORAL_TABLET | Freq: Three times a day (TID) | ORAL | Status: DC | PRN

## 2015-09-28 MED ORDER — TRANEXAMIC ACID 1000 MG/10ML IV SOLN
2000.0000 mg | INTRAVENOUS | Status: DC
  Filled 2015-09-28: qty 20

## 2015-09-28 SURGICAL SUPPLY — 56 items
BLADE SAG 18X100X1.27 (BLADE) ×3 IMPLANT
BLADE SAGITTAL 25.0X1.27X90 (BLADE) ×2 IMPLANT
BLADE SAGITTAL 25.0X1.27X90MM (BLADE) ×1
BLADE SURG 21 STRL SS (BLADE) ×6 IMPLANT
BNDG COHESIVE 6X5 TAN STRL LF (GAUZE/BANDAGES/DRESSINGS) ×4 IMPLANT
BNDG GAUZE ELAST 4 BULKY (GAUZE/BANDAGES/DRESSINGS) ×2 IMPLANT
BONE CEMENT PALACOSE (Orthopedic Implant) ×6 IMPLANT
BOWL SMART MIX CTS (DISPOSABLE) ×3 IMPLANT
CAPT KNEE TOTAL 3 ATTUNE ×2 IMPLANT
CEMENT BONE PALACOSE (Orthopedic Implant) ×2 IMPLANT
COVER SURGICAL LIGHT HANDLE (MISCELLANEOUS) ×6 IMPLANT
CUFF TOURNIQUET SINGLE 34IN LL (TOURNIQUET CUFF) ×3 IMPLANT
CUFF TOURNIQUET SINGLE 44IN (TOURNIQUET CUFF) IMPLANT
DRAPE EXTREMITY T 121X128X90 (DRAPE) ×3 IMPLANT
DRAPE PROXIMA HALF (DRAPES) ×3 IMPLANT
DRAPE U-SHAPE 47X51 STRL (DRAPES) ×3 IMPLANT
DRSG ADAPTIC 3X8 NADH LF (GAUZE/BANDAGES/DRESSINGS) ×3 IMPLANT
DRSG PAD ABDOMINAL 8X10 ST (GAUZE/BANDAGES/DRESSINGS) ×3 IMPLANT
DRSG VAC ATS MED SENSATRAC (GAUZE/BANDAGES/DRESSINGS) ×2 IMPLANT
DURAPREP 26ML APPLICATOR (WOUND CARE) ×3 IMPLANT
ELECT REM PT RETURN 9FT ADLT (ELECTROSURGICAL) ×3
ELECTRODE REM PT RTRN 9FT ADLT (ELECTROSURGICAL) ×1 IMPLANT
FACESHIELD WRAPAROUND (MASK) ×3 IMPLANT
FACESHIELD WRAPAROUND OR TEAM (MASK) ×1 IMPLANT
GAUZE SPONGE 4X4 12PLY STRL (GAUZE/BANDAGES/DRESSINGS) ×3 IMPLANT
GLOVE BIOGEL PI IND STRL 9 (GLOVE) ×1 IMPLANT
GLOVE BIOGEL PI INDICATOR 9 (GLOVE) ×2
GLOVE SURG ORTHO 9.0 STRL STRW (GLOVE) ×3 IMPLANT
GOWN STRL REUS W/ TWL XL LVL3 (GOWN DISPOSABLE) ×2 IMPLANT
GOWN STRL REUS W/TWL XL LVL3 (GOWN DISPOSABLE) ×6
HANDPIECE INTERPULSE COAX TIP (DISPOSABLE) ×3
KIT BASIN OR (CUSTOM PROCEDURE TRAY) ×3 IMPLANT
KIT PREVENA INCISION MGT 13 (CANNISTER) ×2 IMPLANT
KIT ROOM TURNOVER OR (KITS) ×3 IMPLANT
MANIFOLD NEPTUNE II (INSTRUMENTS) ×3 IMPLANT
NDL SPNL 18GX3.5 QUINCKE PK (NEEDLE) ×1 IMPLANT
NEEDLE SPNL 18GX3.5 QUINCKE PK (NEEDLE) ×3 IMPLANT
NS IRRIG 1000ML POUR BTL (IV SOLUTION) ×3 IMPLANT
PACK TOTAL JOINT (CUSTOM PROCEDURE TRAY) ×3 IMPLANT
PACK UNIVERSAL I (CUSTOM PROCEDURE TRAY) ×3 IMPLANT
PAD ARMBOARD 7.5X6 YLW CONV (MISCELLANEOUS) ×3 IMPLANT
PADDING CAST COTTON 6X4 STRL (CAST SUPPLIES) ×3 IMPLANT
SET HNDPC FAN SPRY TIP SCT (DISPOSABLE) ×1 IMPLANT
SPONGE GAUZE 4X4 12PLY STER LF (GAUZE/BANDAGES/DRESSINGS) ×2 IMPLANT
STAPLER VISISTAT 35W (STAPLE) ×3 IMPLANT
SUCTION FRAZIER HANDLE 10FR (MISCELLANEOUS)
SUCTION TUBE FRAZIER 10FR DISP (MISCELLANEOUS) IMPLANT
SUT VIC AB 0 CT1 27 (SUTURE) ×3
SUT VIC AB 0 CT1 27XBRD ANBCTR (SUTURE) ×1 IMPLANT
SUT VIC AB 1 CTX 36 (SUTURE)
SUT VIC AB 1 CTX36XBRD ANBCTR (SUTURE) IMPLANT
SYR 50ML LL SCALE MARK (SYRINGE) ×3 IMPLANT
TOWEL OR 17X24 6PK STRL BLUE (TOWEL DISPOSABLE) ×3 IMPLANT
TOWEL OR 17X26 10 PK STRL BLUE (TOWEL DISPOSABLE) ×3 IMPLANT
TRAY FOLEY CATH 16FRSI W/METER (SET/KITS/TRAYS/PACK) IMPLANT
WRAP KNEE MAXI GEL POST OP (GAUZE/BANDAGES/DRESSINGS) ×3 IMPLANT

## 2015-09-28 NOTE — Anesthesia Postprocedure Evaluation (Signed)
Anesthesia Post Note  Patient: [name]  Procedure(s) Performed: Procedure(s) (LRB): TOTAL KNEE ARTHROPLASTY (Left)  Patient location during evaluation: PACU Anesthesia Type: General and Regional Level of consciousness: awake and alert Pain management: pain level controlled Vital Signs Assessment: post-procedure vital signs reviewed and stable Respiratory status: spontaneous breathing, nonlabored ventilation, respiratory function stable and patient connected to nasal cannula oxygen Cardiovascular status: blood pressure returned to baseline and stable Postop Assessment: no signs of nausea or vomiting Anesthetic complications: no    Last Vitals:  Filed Vitals:   09/28/15 1149 09/28/15 1524  BP: 114/43   Pulse: 71   Temp: 36.2 C 37.2 C  Resp: 18     Last Pain:  Filed Vitals:   09/28/15 1551  PainSc: 0-No pain    LLE Motor Response: Purposeful movement (09/28/15 1548) LLE Sensation: Full sensation (09/28/15 1548)          Russ Looper S

## 2015-09-28 NOTE — Discharge Instructions (Signed)

## 2015-09-28 NOTE — Progress Notes (Signed)
Utilization review completed.  

## 2015-09-28 NOTE — Anesthesia Preprocedure Evaluation (Signed)
Anesthesia Evaluation  Patient identified by MRN, date of birth, ID band Patient awake    Reviewed: Allergy & Precautions  Airway Mallampati: II  TM Distance: >3 FB Neck ROM: Full    Dental   Pulmonary COPD, Current Smoker,    breath sounds clear to auscultation       Cardiovascular hypertension, + CAD and + Peripheral Vascular Disease   Rhythm:Regular Rate:Normal     Neuro/Psych    GI/Hepatic negative GI ROS, Neg liver ROS,   Endo/Other  negative endocrine ROS  Renal/GU negative Renal ROS     Musculoskeletal   Abdominal   Peds  Hematology   Anesthesia Other Findings   Reproductive/Obstetrics                             Anesthesia Physical Anesthesia Plan  ASA: III  Anesthesia Plan: General and Regional   Post-op Pain Management: MAC Combined w/ Regional for Post-op pain   Induction: Intravenous  Airway Management Planned: Oral ETT  Additional Equipment:   Intra-op Plan:   Post-operative Plan: Extubation in OR  Informed Consent: I have reviewed the patients History and Physical, chart, labs and discussed the procedure including the risks, benefits and alternatives for the proposed anesthesia with the patient or authorized representative who has indicated his/her understanding and acceptance.   Dental advisory given  Plan Discussed with: CRNA and Anesthesiologist  Anesthesia Plan Comments:         Anesthesia Quick Evaluation

## 2015-09-28 NOTE — Anesthesia Procedure Notes (Addendum)
Procedure Name: Intubation Date/Time: 09/28/2015 2:03 PM Performed by: Salli Quarry WEAVER Pre-anesthesia Checklist: Patient identified, Emergency Drugs available, Suction available and Patient being monitored Patient Re-evaluated:Patient Re-evaluated prior to inductionOxygen Delivery Method: Circle system utilized Preoxygenation: Pre-oxygenation with 100% oxygen Intubation Type: IV induction Ventilation: Mask ventilation without difficulty Laryngoscope Size: Mac and 3 Grade View: Grade I Tube type: Oral Tube size: 7.5 mm Number of attempts: 1 Airway Equipment and Method: Stylet Placement Confirmation: ETT inserted through vocal cords under direct vision,  positive ETCO2 and breath sounds checked- equal and bilateral Secured at: 22 cm Tube secured with: Tape Dental Injury: Teeth and Oropharynx as per pre-operative assessment    Anesthesia Regional Block:  Femoral nerve block  Pre-Anesthetic Checklist: ,, timeout performed, Correct Patient, Correct Site, Correct Laterality, Correct Procedure, Correct Position, site marked, Risks and benefits discussed,  Surgical consent,  Pre-op evaluation,  At surgeon's request and post-op pain management  Laterality: Left  Prep: chloraprep       Needles:  Injection technique: Single-shot  Needle Type: Stimulator Needle - 80          Additional Needles:  Procedures: Doppler guided and nerve stimulator Femoral nerve block  Nerve Stimulator or Paresthesia:  Response: 0.5 mA,   Additional Responses:   Narrative:  Start time: 09/28/2015 1:30 PM End time: 09/28/2015 1:45 PM Injection made incrementally with aspirations every 5 mL.  Performed by: Personally  Anesthesiologist: Finis Bud

## 2015-09-28 NOTE — H&P (Signed)
TOTAL KNEE ADMISSION H&P  Patient is being admitted for left total knee arthroplasty.  Subjective:  Chief Complaint:left knee pain.  HPI: [name], 75 y.o. female, has a history of pain and functional disability in the left knee due to arthritis and has failed non-surgical conservative treatments for greater than 12 weeks to includeNSAID's and/or analgesics, corticosteriod injections, weight reduction as appropriate and activity modification.  Onset of symptoms was gradual, starting 8 years ago with gradually worsening course since that time. The patient noted no past surgery on the left knee(s).  Patient currently rates pain in the left knee(s) at 8 out of 10 with activity. Patient has night pain, worsening of pain with activity and weight bearing, pain that interferes with activities of daily living, pain with passive range of motion, crepitus and joint swelling.  Patient has evidence of subchondral cysts, subchondral sclerosis, periarticular osteophytes, joint subluxation and joint space narrowing by imaging studies. This patient has had avascular necrosis of the knee. There is no active infection.  Patient Active Problem List   Diagnosis Date Noted  . Bruit 07/10/2012  . Leukocytosis 09/19/2011  . Encephalopathy 09/19/2011  . AAA 01/19/2009  . ILIAC ARTERY ANEURYSM 01/19/2009  . HYPERLIPIDEMIA-MIXED 01/14/2009  . TOBACCO ABUSE 01/14/2009  . Essential hypertension 01/14/2009  . Coronary atherosclerosis 01/14/2009  . Cerebrovascular disease 01/14/2009  . Peripheral vascular disease ([place]) 01/14/2009  . CHRONIC OBSTRUCTIVE PULMONARY DISEASE 01/14/2009  . CHEST PAIN-UNSPECIFIED 01/14/2009   Past Medical History  Diagnosis Date  . CAD (coronary artery disease)   . HTN (hypertension)   . Hyperlipidemia   . Cerebrovascular disease   . Arthritis   . PVD (peripheral vascular disease) ([place])     dvt in leg    Past Surgical History  Procedure Laterality Date  . Knee surgery     denies  . Abdominal hysterectomy    . Total hip arthroplasty Right 09/10/2012    Dr Sharol Given  . Total hip arthroplasty Right 09/10/2012    Procedure: TOTAL HIP ARTHROPLASTY;  Surgeon: Newt Minion, MD;  Location: [facility];  Service: Orthopedics;  Laterality: Right;  Right Total Hip Arthroplasty  . Coronary angioplasty  07    3 stents placed    No prescriptions prior to admission   No Known Allergies  Social History  Substance Use Topics  . Smoking status: Current Every Day Smoker -- 0.50 packs/day for 50 years    Types: Cigarettes  . Smokeless tobacco: Never Used  . Alcohol Use: No    Family History  Problem Relation Age of Onset  . Breast cancer Mother   . Leukemia Father   . Stroke Sister   . Bone cancer       Review of Systems  All other systems reviewed and are negative.   Objective:  Physical Exam  Vital signs in last 24 hours:    Labs:   Estimated body mass index is 24.45 kg/(m^2) as calculated from the following:   Height as of 08/10/14: '5\' 3"'$  (1.6 m).   Weight as of 08/10/14: 62.596 kg (138 lb).   Imaging Review Plain radiographs demonstrate moderate degenerative joint disease of the left knee(s). The overall alignment ismild varus. The bone quality appears to be adequate for age and reported activity level.  Assessment/Plan:  End stage arthritis, left knee   The patient history, physical examination, clinical judgment of the provider and imaging studies are consistent with end stage degenerative joint disease of the left knee(s) and total  knee arthroplasty is deemed medically necessary. The treatment options including medical management, injection therapy arthroscopy and arthroplasty were discussed at length. The risks and benefits of total knee arthroplasty were presented and reviewed. The risks due to aseptic loosening, infection, stiffness, patella tracking problems, thromboembolic complications and other imponderables were discussed. The patient acknowledged  the explanation, agreed to proceed with the plan and consent was signed. Patient is being admitted for inpatient treatment for surgery, pain control, PT, OT, prophylactic antibiotics, VTE prophylaxis, progressive ambulation and ADL's and discharge planning. The patient is planning to be discharged home with home health services

## 2015-09-28 NOTE — Transfer of Care (Signed)
Immediate Anesthesia Transfer of Care Note  Patient: [name]  Procedure(s) Performed: Procedure(s): TOTAL KNEE ARTHROPLASTY (Left)  Patient Location: PACU  Anesthesia Type:GA combined with regional for post-op pain  Level of Consciousness: awake, alert , oriented and patient cooperative  Airway & Oxygen Therapy: Patient Spontanous Breathing and Patient connected to nasal cannula oxygen  Post-op Assessment: Report given to RN and Post -op Vital signs reviewed and stable  Post vital signs: Reviewed and stable  Last Vitals:  Filed Vitals:   09/28/15 1149 09/28/15 1524  BP: 114/43   Pulse: 71   Temp: 36.2 C 37.2 C  Resp: 18     Complications: No apparent anesthesia complications

## 2015-09-28 NOTE — Op Note (Signed)
09/28/2015  3:31 PM  PATIENT:  [name]    PRE-OPERATIVE DIAGNOSIS:  Osteoarthritis Left Knee  POST-OPERATIVE DIAGNOSIS:  Same  PROCEDURE:  TOTAL KNEE ARTHROPLASTY, Application wound VAC.  SURGEON:  Newt Minion, MD  PHYSICIAN ASSISTANT:None ANESTHESIA:   General  PREOPERATIVE INDICATIONS:  [name] is a  75 y.o. female with a diagnosis of Osteoarthritis Left Knee who failed conservative measures and elected for surgical management.    The risks benefits and alternatives were discussed with the patient preoperatively including but not limited to the risks of infection, bleeding, nerve injury, cardiopulmonary complications, the need for revision surgery, among others, and the patient was willing to proceed.  OPERATIVE IMPLANTS: [place] implants; Size 4 femur, size 4 tibia, 10 mm polyethylene tray, 32 mm patella  OPERATIVE FINDINGS: soft osteoporotic bone  OPERATIVE PROCEDURE: patient brought to the operating room and underwent a general anesthetic. After adequate levels anesthesia obtained patient's left lower extremity was prepped using DuraPrep draped into a sterile field. Charlie Pitter was used to cover all exposed skin. A timeout was called. A midline incision was made carried down to the medial parapatellar retinacular incision. The patella was everted. Intramedullary guide was used  With the cutting block to take 9 mm off the distal femur with a 5 valgus. Tension was then focused on the tibia. Tibia cut was made with 9 mm taken off the tibia with neutral varus valgus alignment with 3 posterior slope. This sized for size 4 tibia. Attention was then focused on the femur. The femur sized for a size 4 and the cutting blocks and chamfer blocks were made for the size 4 femur. The trial was then tried after flexion and extension gaps were stable with an 8 mm space block the knee was stable with a 10 mm tray. Patient had full flexion varus and valgus was stable. The keel punch was then  made for the tibia after rotation was checked. The knee was irrigated with pulsatile lavage popliteal fossa was injected with 20 mL X Burrell. The knee was topically irrigated with the trans-Amick acid. The tibia was cemented loose cement was removed the tibial tray was placed and then the femoral component was placed. All loose cement was removed the knee was kept in extension until the cement hardened. The knee was placed the range of motion the clamp was kept on the patella until the cement hardened as well. The patella tracked midline with range of motion varus and valgus was stable. The knee was again irrigated with pulsatile lavage the retinaculum was closed using #1 Vicryl subcutaneous is closed using 0 Vicryl due to the patient's peripheral vascular disease a Prevena wound VAC was applied to help with the microcirculation. Tourniquet time was approximately 20 minutes this was used during the cementation of the components.. Patient was extubated taken to the PACU in stable condition.

## 2015-09-29 ENCOUNTER — Encounter (HOSPITAL_COMMUNITY): Payer: Self-pay | Admitting: Orthopedic Surgery

## 2015-09-29 NOTE — Clinical Social Work Note (Signed)
CSW received referral for patient wanting to go to SNF for short term rehab.  CSW spoke with patient's son [name] [phone] due to patient having some confusion, patient's son would like patient to go to [facility] as first choice, [facility] as a second choice, and [facility] as a third choice.  CSW contacted [facility] who said they should be able to take patient on Saturday or early next week.  CSW to complete formal assessment and fax patient's clinicals to SNFs.  Jones Broom. [place], MSW, [place] 09/29/2015 7:37 PM

## 2015-09-29 NOTE — Evaluation (Signed)
Physical Therapy Evaluation Patient Details Name: [name] MRN: [id number] DOB: [birth year] Today's Date: 09/29/2015   History of Present Illness  Patient is a 75 yo female admitted 09/28/15 now s/p Lt TKA with wound VAC on incision site.   PMH:  CAD, stents x3, HTN, cerebrovascular disease, PVD, arthritis, Rt THA  Clinical Impression  Patient presents with problems listed below.  Will benefit from acute PT to maximize functional mobility prior to discharge.  Recommend SNF at d/c for continued therapy.    Follow Up Recommendations SNF    Equipment Recommendations  Rolling walker with 5" wheels;3in1 (PT)    Recommendations for Other Services       Precautions / Restrictions Precautions Precautions: Knee;Fall Precaution Booklet Issued: Yes (comment) Precaution Comments: Reviewed precautions with patient. Restrictions Weight Bearing Restrictions: Yes LLE Weight Bearing: Weight bearing as tolerated      Mobility  Bed Mobility Overal bed mobility: Needs Assistance Bed Mobility: Supine to Sit     Supine to sit: Mod assist     General bed mobility comments: Verbal cues to move to EOB.  Assist to move LLE to EOB.  Mod assist to scoot forward in sitting.  Transfers Overall transfer level: Needs assistance Equipment used: Rolling walker (2 wheeled) Transfers: Sit to/from [facility] Sit to Stand: Max assist Stand pivot transfers: Max assist       General transfer comment: Max verbal cues (repeated) for hand placement and technique.  Assist to power up to standing.  Patient with significant posterior lean requiring max assist to prevent fall.  Worked on shifting weight forward over RW.  Patient with great difficulty following directions to pivot to chair, and especially for moving stand > sit.  Verbal and tactile cues for each step of task.  At end, removed RW from patient and used +2 hand hold assist to get patient to sit into chair.  Ambulation/Gait              General Gait Details: Unable  Stairs            Wheelchair Mobility    Modified Rankin (Stroke Patients Only)       Balance Overall balance assessment: Needs assistance Sitting-balance support: No upper extremity supported;Feet supported Sitting balance-Leahy Scale: Fair     Standing balance support: Bilateral upper extremity supported Standing balance-Leahy Scale: Poor Standing balance comment: Required UE support and assist to maintain balance in stance.                             Pertinent Vitals/Pain Pain Assessment: Faces Faces Pain Scale: Hurts little more Pain Location: Lt knee Pain Descriptors / Indicators: Sore Pain Intervention(s): Limited activity within patient's tolerance;Monitored during session;Repositioned    Home Living Family/patient expects to be discharged to:: Skilled nursing facility Living Arrangements: Alone                    Prior Function Level of Independence: Independent               Hand Dominance        Extremity/Trunk Assessment   Upper Extremity Assessment: Overall WFL for tasks assessed           Lower Extremity Assessment: LLE deficits/detail   LLE Deficits / Details: Decreased strength and ROM post-op     Communication   Communication: No difficulties  Cognition Arousal/Alertness: Awake/alert Behavior During Therapy: Anxious Overall Cognitive Status:  Impaired/Different from baseline Area of Impairment: Attention;Memory;Following commands;Safety/judgement;Awareness;Problem solving   Current Attention Level: Sustained Memory: Decreased short-term memory Following Commands: Follows one step commands inconsistently;Follows one step commands with increased time Safety/Judgement: Decreased awareness of safety   Problem Solving: Slow processing;Decreased initiation;Difficulty sequencing;Requires verbal cues;Requires tactile cues General Comments: Patient having difficulty  processing commands and sequencing to perform task.    General Comments      Exercises Total Joint Exercises Ankle Circles/Pumps: AROM;Both;10 reps;Seated      Assessment/Plan    PT Assessment Patient needs continued PT services  PT Diagnosis Difficulty walking;Acute pain;Altered mental status   PT Problem List Decreased strength;Decreased range of motion;Decreased activity tolerance;Decreased balance;Decreased mobility;Decreased cognition;Decreased knowledge of use of DME;Decreased safety awareness;Decreased knowledge of precautions;Pain  PT Treatment Interventions DME instruction;Gait training;Functional mobility training;Therapeutic activities;Therapeutic exercise;Cognitive remediation;Patient/family education   PT Goals (Current goals can be found in the Care Plan section) Acute Rehab PT Goals Patient Stated Goal: None stated PT Goal Formulation: With patient Time For Goal Achievement: 10/06/15 Potential to Achieve Goals: Good    Frequency 7X/week   Barriers to discharge Decreased caregiver support Patient lives alone    Co-evaluation               End of Session Equipment Utilized During Treatment: Gait belt Activity Tolerance: Patient limited by pain Patient left: in chair;with call bell/phone within reach;with family/visitor present Nurse Communication: Mobility status         Time: 7591-6384 PT Time Calculation (min) (ACUTE ONLY): 37 min   Charges:   PT Evaluation $PT Eval Moderate Complexity: 1 Procedure PT Treatments $Therapeutic Activity: 8-22 mins   PT G Codes:        Despina Pole 2015/10/27, 11:48 AM Carita Pian. Sanjuana Kava, [place] Pager [phone]

## 2015-09-29 NOTE — Progress Notes (Signed)
Physical Therapy Treatment Patient Details Name: [name] MRN: [id number] DOB: 11/23/1940 Today's Date: 09/29/2015    History of Present Illness Patient is a 75 yo female admitted 09/28/15 now s/p Lt TKA with wound VAC on incision site.   PMH:  CAD, stents x3, HTN, cerebrovascular disease, PVD, arthritis, Rt THA    PT Comments    Spoke with son who reports her cognition is different from baseline.  Reports "this has happened before and it cleared up".  RN informed.  Continues to require max verbal and tactile cuing for all activities/exercises.  Continue to recommend SNF at discharge.   Follow Up Recommendations  SNF     Equipment Recommendations  Rolling walker with 5" wheels;3in1 (PT)    Recommendations for Other Services       Precautions / Restrictions Precautions Precautions: Knee;Fall Precaution Booklet Issued: Yes (comment) Precaution Comments: Reviewed precautions with patient. Restrictions Weight Bearing Restrictions: Yes LLE Weight Bearing: Weight bearing as tolerated    Mobility  Bed Mobility Overal bed mobility: Needs Assistance Bed Mobility: Sit to Supine     Supine to sit: Mod assist Sit to supine: Max assist   General bed mobility comments: Patient unable to scoot in sitting toward HOB.  Required assist to lower trunk and to bring LE's on to bed to move to supine.  +2 assist to scoot to [facility] in supine.  Transfers Overall transfer level: Needs assistance Equipment used: Rolling walker (2 wheeled) Transfers: Sit to/from [facility] Sit to Stand: Max assist Stand pivot transfers: Max assist       General transfer comment: Repeated verbal and tactile cues to place hands on armrests of chair.  Patient unable to figure out how to use UE's to assist with moving to standing.  Significant posterior lean in stance.  Patient able to take several shuffle steps to pivot to bed, requiring verbal and tactile cues for each  step.  Ambulation/Gait             General Gait Details: Unable   Stairs            Wheelchair Mobility    Modified Rankin (Stroke Patients Only)       Balance Overall balance assessment: Needs assistance Sitting-balance support: No upper extremity supported;Feet supported Sitting balance-Leahy Scale: Fair     Standing balance support: Bilateral upper extremity supported Standing balance-Leahy Scale: Poor Standing balance comment: Required UE support and assist to maintain balance in stance.                    Cognition Arousal/Alertness: Awake/alert Behavior During Therapy: Anxious Overall Cognitive Status: Impaired/Different from baseline Area of Impairment: Attention;Memory;Following commands;Safety/judgement;Awareness;Problem solving   Current Attention Level: Sustained Memory: Decreased short-term memory Following Commands: Follows one step commands inconsistently;Follows one step commands with increased time Safety/Judgement: Decreased awareness of safety   Problem Solving: Slow processing;Decreased initiation;Difficulty sequencing;Requires verbal cues;Requires tactile cues General Comments: Son present and reports this is different for patient, and has happened once before during hospitalization.  Continued to have difficulty following commands and sequencing for tasks.     Exercises Total Joint Exercises Ankle Circles/Pumps: AROM;Both;10 reps;Seated Quad Sets: AROM;Left;5 reps;Seated Heel Slides: AAROM;Left;5 reps;Seated Hip ABduction/ADduction: AAROM;Left;5 reps;Seated    General Comments        Pertinent Vitals/Pain Pain Assessment: Faces Faces Pain Scale: Hurts little more Pain Location: Lt knee and Rt shin Pain Descriptors / Indicators: Sore Pain Intervention(s): Limited activity within patient's tolerance;Monitored during session;Repositioned  Home Living Family/patient expects to be discharged to:: Skilled nursing  facility Living Arrangements: Alone                  Prior Function Level of Independence: Independent          PT Goals (current goals can now be found in the care plan section) Acute Rehab PT Goals Patient Stated Goal: None stated PT Goal Formulation: With patient Time For Goal Achievement: 10/06/15 Potential to Achieve Goals: Good Progress towards PT goals: Progressing toward goals (Very slow progress)    Frequency  7X/week    PT Plan Current plan remains appropriate    Co-evaluation             End of Session Equipment Utilized During Treatment: Gait belt Activity Tolerance: Patient limited by pain (Limited by cognition) Patient left: in bed;with call bell/phone within reach;with bed alarm set;with family/visitor present     Time: 8416-6063 PT Time Calculation (min) (ACUTE ONLY): 29 min  Charges:  $Therapeutic Exercise: 8-22 mins $Therapeutic Activity: 8-22 mins                    G Codes:      Despina Pole 2015-10-09, 2:52 PM Carita Pian. Sanjuana Kava, [place] Pager [phone]

## 2015-09-29 NOTE — Progress Notes (Signed)
Patient ID: [name], female   DOB: 1941/03/05, 75 y.o.   MRN: [id number] Postoperative day 1 left total knee arthroplasty. Patient is resting comfortably this morning. Plan for discharge to skilled nursing possibly on Friday. Physical therapy progressive ambulation weightbearing as tolerated.

## 2015-09-30 MED ORDER — TRAMADOL HCL 50 MG PO TABS
50.0000 mg | ORAL_TABLET | Freq: Four times a day (QID) | ORAL | Status: DC | PRN
  Administered 2015-09-30 – 2015-10-01 (×2): 50 mg via ORAL
  Filled 2015-09-30: qty 1

## 2015-09-30 MED ORDER — TRAMADOL HCL 50 MG PO TABS: 50.0000 mg | ORAL_TABLET | Freq: Four times a day (QID) | ORAL | Status: DC | PRN

## 2015-09-30 MED ORDER — ACETAMINOPHEN-CODEINE #3 300-30 MG PO TABS: 1.0000 | ORAL_TABLET | Freq: Four times a day (QID) | ORAL | Status: DC | PRN

## 2015-09-30 MED ORDER — ASPIRIN EC 325 MG PO TBEC: 325.0000 mg | DELAYED_RELEASE_TABLET | Freq: Every day | ORAL | Status: DC

## 2015-09-30 NOTE — Progress Notes (Signed)
CSW spoke with pt son [name] to confirm choice for [facility] SNF.  Anticipate possible DC to [facility] on 3/4- [facility] is able to accept over the weekend- will need DC summary hard faxed to facility at [phone].  CSW will need to speak with charge RN concerning DC plans at [phone]  CSW will continue to follow  Domenica Reamer, [place] Social Worker [phone]

## 2015-09-30 NOTE — Discharge Summary (Signed)
Physician Discharge Summary  Patient ID: [name] MRN: [id number] DOB/AGE: 1940/09/26 75 y.o.  Admit date: 09/28/2015 Discharge date: 09/30/2015  Admission Diagnoses:Gen. arthritis left knee  Discharge Diagnoses:  Active Problems:   Total knee replacement status   Discharged Condition: stable  Hospital Course: Patient's hospital course was essentially unremarkable. She underwent total knee arthroplasty postoperatively patient progressed slowly with therapy and was discharged to skilled nursing in stable condition.  Consults: None  Significant Diagnostic Studies: labs: Routine labs  Treatments: surgery: See operative note  Discharge Exam: Blood pressure 147/54, pulse 97, temperature 97.9 F (36.6 C), temperature source Oral, resp. rate 16, weight 57.153 kg (126 lb), SpO2 92 %. Incision/Wound: Dressing clean and dry  Disposition: 03-Skilled Nursing Facility  Discharge Instructions    Call MD / Call 911    Complete by:  As directed   If you experience chest pain or shortness of breath, CALL 911 and be transported to the hospital emergency room.  If you develope a fever above 101 F, pus (white drainage) or increased drainage or redness at the wound, or calf pain, call your surgeon's office.     Constipation Prevention    Complete by:  As directed   Drink plenty of fluids.  Prune juice may be helpful.  You may use a stool softener, such as Colace (over the counter) 100 mg twice a day.  Use MiraLax (over the counter) for constipation as needed.     Diet - low sodium heart healthy    Complete by:  As directed      Increase activity slowly as tolerated    Complete by:  As directed      Weight bearing as tolerated    Complete by:  As directed             Medication List    TAKE these medications        amLODipine 10 MG tablet  Commonly known as:  NORVASC  TAKE 1 TABLET (10 MG TOTAL) BY MOUTH DAILY.     aspirin 81 MG tablet  Take 81 mg by mouth daily.     aspirin  EC 325 MG tablet  Take 1 tablet (325 mg total) by mouth daily.     atorvastatin 40 MG tablet  Commonly known as:  LIPITOR  TAKE ONE TABLET BY MOUTH DAILY.     carvedilol 6.25 MG tablet  Commonly known as:  COREG  TAKE 1 TABLET (6.25 MG TOTAL) BY MOUTH 2 (TWO) TIMES DAILY WITH A MEAL. KEEP APPT FOR REFILLS     cloNIDine 0.1 MG tablet  Commonly known as:  CATAPRES  Take 1 tablet (0.1 mg total) by mouth 2 (two) times daily.     colchicine 0.6 MG tablet  Take 0.6-1.2 mg by mouth daily as needed. gout     diclofenac 75 MG EC tablet  Commonly known as:  VOLTAREN  Take 75 mg by mouth 2 (two) times daily.     Glucosamine-Chondroitin Caps  Take 1 capsule by mouth daily.     hydrochlorothiazide 12.5 MG capsule  Commonly known as:  MICROZIDE  TAKE 1 CAPSULE (12.5 MG TOTAL) BY MOUTH EVERY MORNING.     lisinopril 40 MG tablet  Commonly known as:  PRINIVIL,ZESTRIL  TAKE 1 TABLET (40 MG TOTAL) BY MOUTH DAILY.     multivitamin with minerals Tabs tablet  Take 1 tablet by mouth daily.     traMADol 50 MG tablet  Commonly known as:  Veatrice Bourbon  Take 50 mg by mouth every 6 (six) hours as needed.     traMADol 50 MG tablet  Commonly known as:  ULTRAM  Take 1 tablet (50 mg total) by mouth every 6 (six) hours as needed for moderate pain.           Follow-up Information    Follow up with Pavel Gadd V, MD In 2 weeks.   Specialty:  Orthopedic Surgery   Contact information:   [place] [address] [phone]       Signed: Newt Minion 09/30/2015, 6:11 AM

## 2015-09-30 NOTE — Progress Notes (Signed)
Physical Therapy Treatment Patient Details Name: [name] MRN: [id number] DOB: [birth year] Today's Date: 09/30/2015    History of Present Illness Patient is a 75 yo female admitted 09/28/15 now s/p Lt TKA with wound VAC on incision site.   PMH:  CAD, stents x3, HTN, cerebrovascular disease, PVD, arthritis, Rt THA    PT Comments    Patient progressing with mobility and able to tolerate exercises well.  Feel continued skilled rehab in acute setting needed to allow decrease burden of care at next venue.  SNF level rehab still recommended prior to d/c home.  Follow Up Recommendations  SNF     Equipment Recommendations  Rolling walker with 5" wheels;3in1 (PT)    Recommendations for Other Services       Precautions / Restrictions Precautions Precautions: Knee;Fall Precaution Comments: Reviewed precautions with patient. Restrictions Weight Bearing Restrictions: Yes LLE Weight Bearing: Weight bearing as tolerated    Mobility  Bed Mobility Overal bed mobility: Needs Assistance       Supine to sit: Min assist     General bed mobility comments: assist for R LE and scooting out on pad under pt  Transfers Overall transfer level: Needs assistance Equipment used: Rolling walker (2 wheeled) Transfers: Sit to/from Stand Sit to Stand: Mod assist Stand pivot transfers: Min assist       General transfer comment: Assist to lift from EOB, able to pivot to [facility] with RW and min A cues for sequencing with walker and slightly antalgic, the up to pivot to recliner with RW and min A less antalgic  Ambulation/Gait             General Gait Details: declined   Financial trader Rankin (Stroke Patients Only)       Balance Overall balance assessment: Needs assistance   Sitting balance-Leahy Scale: Good     Standing balance support: Bilateral upper extremity supported Standing balance-Leahy Scale: Poor Standing balance comment: UE  support for balance                     Cognition Arousal/Alertness: Awake/alert Behavior During Therapy: WFL for tasks assessed/performed Overall Cognitive Status: Impaired/Different from baseline Area of Impairment: Safety/judgement;Problem solving;Attention   Current Attention Level: Selective Memory: Decreased short-term memory Following Commands: Follows one step commands with increased time     Problem Solving: Slow processing;Requires verbal cues      Exercises Total Joint Exercises Ankle Circles/Pumps: AROM;Both;10 reps;Supine Quad Sets: AROM;Both;10 reps;Supine Short Arc Quad: AROM;Left;10 reps;Supine Heel Slides: AAROM;Both;10 reps;Supine Straight Leg Raises: AAROM;10 reps;Supine;Left Goniometric ROM: AROM R knee 5-55 degrees    General Comments General comments (skin integrity, edema, etc.): wound vac on operative leg, demonstrates good movement/mobility      Pertinent Vitals/Pain Pain Assessment: 0-10 Pain Score: 5  Pain Location: L knee Pain Descriptors / Indicators: Sore;Aching Pain Intervention(s): Monitored during session;Repositioned;Ice applied    Home Living                      Prior Function            PT Goals (current goals can now be found in the care plan section) Progress towards PT goals: Progressing toward goals    Frequency  7X/week    PT Plan Current plan remains appropriate    Co-evaluation  End of Session Equipment Utilized During Treatment: Gait belt Activity Tolerance: Patient tolerated treatment well Patient left: in chair;with call bell/phone within reach;with family/visitor present;with chair alarm set     Time: 0277-4128 PT Time Calculation (min) (ACUTE ONLY): 39 min  Charges:  $Therapeutic Exercise: 8-22 mins $Therapeutic Activity: 23-37 mins                    G Codes:      Reginia Naas 10/03/2015, 1:44 PM  Magda Kiel, [place] Oct 03, 2015

## 2015-09-30 NOTE — Progress Notes (Signed)
Patient ID: [name], female   DOB: 19-Apr-1941, 75 y.o.   MRN: [id number] Plan for [facility] to [facility] on Saturday. Patient's oxycodone discontinued and placed back on her tramadol secondary to confusion.

## 2015-09-30 NOTE — NC FL2 (Signed)
[place] LEVEL OF CARE SCREENING TOOL     IDENTIFICATION  Patient Name: [name] Birthdate: 1941/01/11 Sex: female Admission Date (Current Location): 09/28/2015  [facility] [place] Number:  Herbalist and Address:  [facility] [place] [place] [address], [place], [place] [address]      Provider Number: [id number]  Attending Physician Name and Address:  Newt Minion, MD  Relative Name and Phone Number:       Current Level of Care: SNF Recommended Level of Care: [place] Prior Approval Number:    Date Approved/Denied:   PASRR Number: [id number]  Discharge Plan: SNF    Current Diagnoses: Patient Active Problem List   Diagnosis Date Noted  . Total knee replacement status 09/28/2015  . Bruit 07/10/2012  . Leukocytosis 09/19/2011  . Encephalopathy 09/19/2011  . AAA 01/19/2009  . ILIAC ARTERY ANEURYSM 01/19/2009  . HYPERLIPIDEMIA-MIXED 01/14/2009  . TOBACCO ABUSE 01/14/2009  . Essential hypertension 01/14/2009  . Coronary atherosclerosis 01/14/2009  . Cerebrovascular disease 01/14/2009  . Peripheral vascular disease ([place]) 01/14/2009  . CHRONIC OBSTRUCTIVE PULMONARY DISEASE 01/14/2009  . CHEST PAIN-UNSPECIFIED 01/14/2009    Orientation RESPIRATION BLADDER Height & Weight     Self, Time, Situation, Place  Normal Continent Weight: 126 lb (57.153 kg) Height:     BEHAVIORAL SYMPTOMS/MOOD NEUROLOGICAL BOWEL NUTRITION STATUS      Continent Diet (regular)  AMBULATORY STATUS COMMUNICATION OF NEEDS Skin   Extensive Assist Verbally Surgical wounds, Wound Vac (Prevena wound vac)                       Personal Care Assistance Level of Assistance  Bathing, Dressing Bathing Assistance: Maximum assistance   Dressing Assistance: Maximum assistance     Functional Limitations Info             SPECIAL CARE FACTORS FREQUENCY  PT (By licensed PT), OT (By licensed OT)     PT Frequency: 5/wk OT  Frequency: 5/wk            Contractures      Additional Factors Info  Code Status, Allergies Code Status Info: FULL Allergies Info: NKA           Current Medications (09/30/2015):  This is the current hospital active medication list Current Facility-Administered Medications  Medication Dose Route Frequency Provider Last Rate Last Dose  . 0.9 %  sodium chloride infusion   Intravenous Continuous Newt Minion, MD 50 mL/hr at 09/29/15 [phone]    . acetaminophen (TYLENOL) tablet 650 mg  650 mg Oral Q6H PRN Newt Minion, MD   650 mg at 09/29/15 1734   Or  . acetaminophen (TYLENOL) suppository 650 mg  650 mg Rectal Q6H PRN Newt Minion, MD      . acetaminophen-codeine (TYLENOL #3) 300-30 MG per tablet 1 tablet  1 tablet Oral Q6H PRN Newt Minion, MD      . alum & mag hydroxide-simeth (MAALOX/MYLANTA) 200-200-20 MG/5ML suspension 30 mL  30 mL Oral Q4H PRN Newt Minion, MD      . amLODipine (NORVASC) tablet 10 mg  10 mg Oral Daily Newt Minion, MD   10 mg at 09/30/15 7673  . aspirin EC tablet 325 mg  325 mg Oral Q breakfast Newt Minion, MD   325 mg at 09/30/15 4193  . atorvastatin (LIPITOR) tablet 40 mg  40 mg Oral q1800 Newt Minion, MD  40 mg at 09/29/15 1729  . bisacodyl (DULCOLAX) suppository 10 mg  10 mg Rectal Daily PRN Meridee Score V, MD      . carvedilol (COREG) tablet 6.25 mg  6.25 mg Oral BID WC Newt Minion, MD   6.25 mg at 09/30/15 0937  . cloNIDine (CATAPRES) tablet 0.1 mg  0.1 mg Oral BID Newt Minion, MD   0.1 mg at 09/30/15 2863  . colchicine tablet 0.6-1.2 mg  0.6-1.2 mg Oral Daily PRN Newt Minion, MD      . docusate sodium (COLACE) capsule 100 mg  100 mg Oral BID Newt Minion, MD   100 mg at 09/30/15 8177  . ferrous sulfate tablet 325 mg  325 mg Oral TID PC Newt Minion, MD   325 mg at 09/30/15 1209  . hydrochlorothiazide (MICROZIDE) capsule 12.5 mg  12.5 mg Oral Daily Newt Minion, MD   12.5 mg at 09/29/15 0809  . lisinopril (PRINIVIL,ZESTRIL) tablet 40 mg   40 mg Oral Daily Newt Minion, MD   40 mg at 09/30/15 0937  . menthol-cetylpyridinium (CEPACOL) lozenge 3 mg  1 lozenge Oral PRN Newt Minion, MD       Or  . phenol (CHLORASEPTIC) mouth spray 1 spray  1 spray Mouth/Throat PRN Newt Minion, MD      . metoCLOPramide (REGLAN) tablet 5-10 mg  5-10 mg Oral Q8H PRN Newt Minion, MD       Or  . metoCLOPramide (REGLAN) injection 5-10 mg  5-10 mg Intravenous Q8H PRN Newt Minion, MD      . ondansetron [facility]) tablet 4 mg  4 mg Oral Q6H PRN Newt Minion, MD       Or  . ondansetron [facility]) injection 4 mg  4 mg Intravenous Q6H PRN Newt Minion, MD      . polyethylene glycol (MIRALAX / GLYCOLAX) packet 17 g  17 g Oral Daily PRN Newt Minion, MD      . traMADol Veatrice Bourbon) tablet 50 mg  50 mg Oral Q6H PRN Newt Minion, MD   50 mg at 09/30/15 1165     Discharge Medications: Please see discharge summary for a list of discharge medications.  Relevant Imaging Results:  Relevant Lab Results:   Additional Information SS#: [id number]  Cranford Mon, [place]

## 2015-10-01 DIAGNOSIS — Z955 Presence of coronary angioplasty implant and graft: Secondary | ICD-10-CM | POA: Diagnosis not present

## 2015-10-01 DIAGNOSIS — Z7982 Long term (current) use of aspirin: Secondary | ICD-10-CM | POA: Diagnosis not present

## 2015-10-01 DIAGNOSIS — M0080 Arthritis due to other bacteria, unspecified joint: Secondary | ICD-10-CM | POA: Diagnosis not present

## 2015-10-01 DIAGNOSIS — F17210 Nicotine dependence, cigarettes, uncomplicated: Secondary | ICD-10-CM | POA: Diagnosis present

## 2015-10-01 DIAGNOSIS — Z8673 Personal history of transient ischemic attack (TIA), and cerebral infarction without residual deficits: Secondary | ICD-10-CM | POA: Diagnosis not present

## 2015-10-01 DIAGNOSIS — Y838 Other surgical procedures as the cause of abnormal reaction of the patient, or of later complication, without mention of misadventure at the time of the procedure: Secondary | ICD-10-CM | POA: Diagnosis not present

## 2015-10-01 DIAGNOSIS — Z87891 Personal history of nicotine dependence: Secondary | ICD-10-CM | POA: Diagnosis not present

## 2015-10-01 DIAGNOSIS — L03116 Cellulitis of left lower limb: Secondary | ICD-10-CM | POA: Diagnosis present

## 2015-10-01 DIAGNOSIS — M1990 Unspecified osteoarthritis, unspecified site: Secondary | ICD-10-CM | POA: Diagnosis not present

## 2015-10-01 DIAGNOSIS — J449 Chronic obstructive pulmonary disease, unspecified: Secondary | ICD-10-CM | POA: Diagnosis not present

## 2015-10-01 DIAGNOSIS — M25562 Pain in left knee: Secondary | ICD-10-CM | POA: Diagnosis not present

## 2015-10-01 DIAGNOSIS — T8450XD Infection and inflammatory reaction due to unspecified internal joint prosthesis, subsequent encounter: Secondary | ICD-10-CM | POA: Diagnosis not present

## 2015-10-01 DIAGNOSIS — M00862 Arthritis due to other bacteria, left knee: Secondary | ICD-10-CM | POA: Diagnosis not present

## 2015-10-01 DIAGNOSIS — Z79899 Other long term (current) drug therapy: Secondary | ICD-10-CM | POA: Diagnosis not present

## 2015-10-01 DIAGNOSIS — R2689 Other abnormalities of gait and mobility: Secondary | ICD-10-CM | POA: Diagnosis not present

## 2015-10-01 DIAGNOSIS — T8454XA Infection and inflammatory reaction due to internal left knee prosthesis, initial encounter: Secondary | ICD-10-CM | POA: Diagnosis not present

## 2015-10-01 DIAGNOSIS — I10 Essential (primary) hypertension: Secondary | ICD-10-CM | POA: Diagnosis present

## 2015-10-01 DIAGNOSIS — E785 Hyperlipidemia, unspecified: Secondary | ICD-10-CM | POA: Diagnosis not present

## 2015-10-01 DIAGNOSIS — S81002D Unspecified open wound, left knee, subsequent encounter: Secondary | ICD-10-CM | POA: Diagnosis not present

## 2015-10-01 DIAGNOSIS — Y831 Surgical operation with implant of artificial internal device as the cause of abnormal reaction of the patient, or of later complication, without mention of misadventure at the time of the procedure: Secondary | ICD-10-CM | POA: Diagnosis present

## 2015-10-01 DIAGNOSIS — K5900 Constipation, unspecified: Secondary | ICD-10-CM | POA: Diagnosis not present

## 2015-10-01 DIAGNOSIS — R531 Weakness: Secondary | ICD-10-CM | POA: Diagnosis not present

## 2015-10-01 DIAGNOSIS — I2510 Atherosclerotic heart disease of native coronary artery without angina pectoris: Secondary | ICD-10-CM | POA: Diagnosis not present

## 2015-10-01 DIAGNOSIS — I739 Peripheral vascular disease, unspecified: Secondary | ICD-10-CM | POA: Diagnosis present

## 2015-10-01 DIAGNOSIS — Z86718 Personal history of other venous thrombosis and embolism: Secondary | ICD-10-CM | POA: Diagnosis not present

## 2015-10-01 DIAGNOSIS — B9689 Other specified bacterial agents as the cause of diseases classified elsewhere: Secondary | ICD-10-CM | POA: Diagnosis not present

## 2015-10-01 DIAGNOSIS — M009 Pyogenic arthritis, unspecified: Secondary | ICD-10-CM | POA: Diagnosis present

## 2015-10-01 DIAGNOSIS — I714 Abdominal aortic aneurysm, without rupture: Secondary | ICD-10-CM | POA: Diagnosis not present

## 2015-10-01 DIAGNOSIS — M25569 Pain in unspecified knee: Secondary | ICD-10-CM | POA: Diagnosis not present

## 2015-10-01 DIAGNOSIS — T8454XS Infection and inflammatory reaction due to internal left knee prosthesis, sequela: Secondary | ICD-10-CM | POA: Diagnosis not present

## 2015-10-01 DIAGNOSIS — L02416 Cutaneous abscess of left lower limb: Secondary | ICD-10-CM | POA: Diagnosis not present

## 2015-10-01 DIAGNOSIS — Z96652 Presence of left artificial knee joint: Secondary | ICD-10-CM | POA: Diagnosis not present

## 2015-10-01 DIAGNOSIS — Z823 Family history of stroke: Secondary | ICD-10-CM | POA: Diagnosis not present

## 2015-10-01 DIAGNOSIS — Z471 Aftercare following joint replacement surgery: Secondary | ICD-10-CM | POA: Diagnosis not present

## 2015-10-01 DIAGNOSIS — Z96641 Presence of right artificial hip joint: Secondary | ICD-10-CM | POA: Diagnosis present

## 2015-10-01 DIAGNOSIS — M00062 Staphylococcal arthritis, left knee: Secondary | ICD-10-CM | POA: Diagnosis present

## 2015-10-01 DIAGNOSIS — I723 Aneurysm of iliac artery: Secondary | ICD-10-CM | POA: Diagnosis not present

## 2015-10-01 NOTE — Clinical Social Work Placement (Signed)
   CLINICAL SOCIAL WORK PLACEMENT  NOTE  Date:  10/01/2015  Patient Details  Name: [name] MRN: [id number] Date of Birth: January 21, 1941  Clinical Social Work is seeking post-discharge placement for this patient at the [place] level of care (*CSW will initial, date and re-position this form in  chart as items are completed):  Yes   Patient/family provided with [place] Work Department's list of facilities offering this level of care within the geographic area requested by the patient (or if unable, by the patient's family).  Yes   Patient/family informed of their freedom to choose among providers that offer the needed level of care, that participate in Medicare, Medicaid or managed care program needed by the patient, have an available bed and are willing to accept the patient.  Yes   Patient/family informed of [facility]'s ownership interest in [facility] and [facility], as well as of the fact that they are under no obligation to receive care at these facilities.  PASRR submitted to EDS on       PASRR number received on       Existing PASRR number confirmed on 10/01/15     FL2 transmitted to all facilities in geographic area requested by pt/family on 09/30/15     FL2 transmitted to all facilities within larger geographic area on       Patient informed that his/her managed care company has contracts with or will negotiate with certain facilities, including the following:        Yes   Patient/family informed of bed offers received.  Patient chooses bed at [facility] recommends and patient chooses bed at      Patient to be transferred to [facility] on 10/01/15.  Patient to be transferred to facility by PTAR     Patient family notified on 10/01/15 of transfer.  Name of family member notified:  Son in room      PHYSICIAN       Additional Comment:     _______________________________________________ Boone Master, [place] 10/01/2015, 10:32 AM

## 2015-10-01 NOTE — Clinical Social Work Note (Signed)
Clinical Social Work Assessment  Patient Details  Name: [name] MRN: [id number] Date of Birth: Jun 30, 1941  Date of referral:  10/01/15               Reason for consult:  Facility Placement                Permission sought to share information with:  Facility Sport and exercise psychologist, Family Supports Permission granted to share information::  Yes, Verbal Permission Granted  Name::     [name], patient's son [phone]  Agency::  SNF admissions  Relationship::     Contact Information:     Housing/Transportation Living arrangements for the past 2 months:  Single Family Home Source of Information:  Adult Children Patient Interpreter Needed:  None Criminal Activity/Legal Involvement Pertinent to Current Situation/Hospitalization:  No - Comment as needed Significant Relationships:  Adult Children Lives with:  Self Do you feel safe going back to the place where you live?  No (Patient needs some short term rehab before she can return home.) Need for family participation in patient care:  Yes (Comment)  Care giving concerns:  PT recommends SNF at DC   Social Worker assessment / plan:  Pt and son agreeable to DC to [facility] today. CSW explained process and they are requesting that PTAR transport pt to facility. CSW placed hard scripts and PTAR forms in chart and coordinated transportation. RN to call report.   CSW is signing off but available if needed.  Employment status:  Retired Forensic scientist:  Medicare PT Recommendations:  [place] / Referral to community resources:  [place]  Patient/Family's Response to care:  Pt and family agreeable to [facility] SNF.  Patient/Family's Understanding of and Emotional Response to Diagnosis, Current Treatment, and Prognosis:  Pt engaged and appreciative of visit. Pt and family asked appropriate questions re: transfer to SNF.  Emotional Assessment Appearance:  Appears stated  age Attitude/Demeanor/Rapport:  Unable to Assess Affect (typically observed):  Appropriate Orientation:  Oriented to Self, Oriented to Place, Oriented to  Time, Oriented to Situation Alcohol / Substance use:  Never Used Psych involvement (Current and /or in the community):  No (Comment)  Discharge Needs  Concerns to be addressed:  No discharge needs identified Readmission within the last 30 days:  No Current discharge risk:  None Barriers to Discharge:  No Barriers Identified   Boone Master, [place] 10/01/2015, 10:33 AM Weekend Coverage

## 2015-10-01 NOTE — Progress Notes (Signed)
Subjective: 3 Days Post-Op Procedure(s) (LRB): TOTAL KNEE ARTHROPLASTY (Left) Patient reports pain as mild.    Objective: Vital signs in last 24 hours: Temp:  [98.1 F (36.7 C)-98.3 F (36.8 C)] 98.3 F (36.8 C) (03/04 0437) Pulse Rate:  [75-101] 75 (03/04 0437) Resp:  [16-18] 18 (03/04 0437) BP: (124-158)/(50-69) 124/51 mmHg (03/04 0437) SpO2:  [95 %-96 %] 95 % (03/04 0437)  Intake/Output from previous day:   Intake/Output this shift: Total I/O In: 360 [P.O.:360] Out: -   No results for input(s): HGB in the last 72 hours. No results for input(s): WBC, RBC, HCT, PLT in the last 72 hours. No results for input(s): NA, K, CL, CO2, BUN, CREATININE, GLUCOSE, CALCIUM in the last 72 hours. No results for input(s): LABPT, INR in the last 72 hours.  Neurologically intact  Assessment/Plan: 3 Days Post-Op Procedure(s) (LRB): TOTAL KNEE ARTHROPLASTY (Left) Discharge to SNF  [place] today  [place] C 10/01/2015, 9:17 AM

## 2015-10-03 DIAGNOSIS — K5900 Constipation, unspecified: Secondary | ICD-10-CM | POA: Diagnosis not present

## 2015-10-03 DIAGNOSIS — I10 Essential (primary) hypertension: Secondary | ICD-10-CM | POA: Diagnosis not present

## 2015-10-03 DIAGNOSIS — M25562 Pain in left knee: Secondary | ICD-10-CM | POA: Diagnosis not present

## 2015-10-03 DIAGNOSIS — E785 Hyperlipidemia, unspecified: Secondary | ICD-10-CM | POA: Diagnosis not present

## 2015-10-06 DIAGNOSIS — M25562 Pain in left knee: Secondary | ICD-10-CM | POA: Diagnosis not present

## 2015-10-06 DIAGNOSIS — K5900 Constipation, unspecified: Secondary | ICD-10-CM | POA: Diagnosis not present

## 2015-10-06 DIAGNOSIS — R2689 Other abnormalities of gait and mobility: Secondary | ICD-10-CM | POA: Diagnosis not present

## 2015-10-07 NOTE — Progress Notes (Signed)
HPI: FU coronary artery disease. She is status post PCI of her circumflex and right coronary artery with drug-eluting stents in August 2008. Abdominal ultrasound in January 2014 showed moderate right and severe left common iliac stenosis. ABIs in January of 2014 were normal. Patient seen by Dr Fletcher Anon and medical therapy recommended for now. Nuclear study January 2016 showed ejection fraction 68% and normal perfusion. Carotid Dopplers November 2016 showed 40-59% left and 1-39% right. Follow-up one year. Since I last saw her,   Current Outpatient Prescriptions  Medication Sig Dispense Refill  . amLODipine (NORVASC) 10 MG tablet TAKE 1 TABLET (10 MG TOTAL) BY MOUTH DAILY. (Patient taking differently: Take 10 mg by mouth daily. ) 90 tablet 3  . aspirin 81 MG tablet Take 81 mg by mouth daily.      [name] aspirin EC 325 MG tablet Take 1 tablet (325 mg total) by mouth daily. 30 tablet 0  . atorvastatin (LIPITOR) 40 MG tablet TAKE ONE TABLET BY MOUTH DAILY. (Patient taking differently: Take 40 mg by mouth daily at 6 PM. ) 90 tablet 0  . carvedilol (COREG) 6.25 MG tablet TAKE 1 TABLET (6.25 MG TOTAL) BY MOUTH 2 (TWO) TIMES DAILY WITH A MEAL. KEEP APPT FOR REFILLS (Patient taking differently: Take 6.25 mg by mouth 2 (two) times daily with a meal. ) 60 tablet 1  . cloNIDine (CATAPRES) 0.1 MG tablet Take 1 tablet (0.1 mg total) by mouth 2 (two) times daily. 60 tablet 2  . colchicine 0.6 MG tablet Take 0.6-1.2 mg by mouth daily as needed. gout    . diclofenac (VOLTAREN) 75 MG EC tablet Take 75 mg by mouth 2 (two) times daily.     . Glucosamine-Chondroit-Vit C-Mn (GLUCOSAMINE-CHONDROITIN) CAPS Take 1 capsule by mouth daily.    . hydrochlorothiazide (MICROZIDE) 12.5 MG capsule TAKE 1 CAPSULE (12.5 MG TOTAL) BY MOUTH EVERY MORNING. (Patient taking differently: Take 12.5 mg by mouth daily. ) 90 capsule 3  . lisinopril (PRINIVIL,ZESTRIL) 40 MG tablet TAKE 1 TABLET (40 MG TOTAL) BY MOUTH DAILY. (Patient taking  differently: Take 40 mg by mouth daily. ) 90 tablet 3  . Multiple Vitamin (MULITIVITAMIN WITH MINERALS) TABS Take 1 tablet by mouth daily.    . traMADol (ULTRAM) 50 MG tablet Take 50 mg by mouth every 6 (six) hours as needed.    . traMADol (ULTRAM) 50 MG tablet Take 1 tablet (50 mg total) by mouth every 6 (six) hours as needed for moderate pain. 30 tablet 0   No current facility-administered medications for this visit.     Past Medical History  Diagnosis Date  . CAD (coronary artery disease)   . HTN (hypertension)   . Hyperlipidemia   . Cerebrovascular disease   . Arthritis   . PVD (peripheral vascular disease) ([place])     dvt in leg    Past Surgical History  Procedure Laterality Date  . Knee surgery      denies  . Abdominal hysterectomy    . Total hip arthroplasty Right 09/10/2012    Dr Sharol Given  . Total hip arthroplasty Right 09/10/2012    Procedure: TOTAL HIP ARTHROPLASTY;  Surgeon: Newt Minion, MD;  Location: [facility];  Service: Orthopedics;  Laterality: Right;  Right Total Hip Arthroplasty  . Coronary angioplasty  07    3 stents placed  . Total knee arthroplasty Left 09/28/2015    Procedure: TOTAL KNEE ARTHROPLASTY;  Surgeon: Newt Minion, MD;  Location: [facility];  Service: Orthopedics;  Laterality: Left;    Social History   Social History  . Marital Status: Single    Spouse Name: N/A  . Number of Children: N/A  . Years of Education: N/A   Occupational History  . Not on file.   Social History Main Topics  . Smoking status: Current Every Day Smoker -- 0.50 packs/day for 50 years    Types: Cigarettes  . Smokeless tobacco: Never Used  . Alcohol Use: No  . Drug Use: No  . Sexual Activity: No   Other Topics Concern  . Not on file   Social History Narrative    Family History  Problem Relation Age of Onset  . Breast cancer Mother   . Leukemia Father   . Stroke Sister   . Bone cancer      ROS: no fevers or chills, productive cough, hemoptysis, dysphasia,  odynophagia, melena, hematochezia, dysuria, hematuria, rash, seizure activity, orthopnea, PND, pedal edema, claudication. Remaining systems are negative.  Physical Exam: Well-developed well-nourished in no acute distress.  Skin is warm and dry.  HEENT is normal.  Neck is supple.  Chest is clear to auscultation with normal expansion.  Cardiovascular exam is regular rate and rhythm.  Abdominal exam nontender or distended. No masses palpated. Extremities show no edema. neuro grossly intact  ECG     This encounter was created in error - please disregard.

## 2015-10-10 DIAGNOSIS — M25562 Pain in left knee: Secondary | ICD-10-CM | POA: Diagnosis not present

## 2015-10-10 DIAGNOSIS — R2689 Other abnormalities of gait and mobility: Secondary | ICD-10-CM | POA: Diagnosis not present

## 2015-10-12 ENCOUNTER — Other Ambulatory Visit: Payer: Self-pay | Admitting: Orthopedic Surgery

## 2015-10-12 ENCOUNTER — Encounter (HOSPITAL_COMMUNITY)
Admission: RE | Disposition: A | Discharge status: Skilled Nursing Facility | Payer: Self-pay | Source: Ambulatory Visit | Attending: Orthopedic Surgery

## 2015-10-12 ENCOUNTER — Inpatient Hospital Stay (HOSPITAL_COMMUNITY): Payer: Medicare Other | Admitting: Certified Registered"

## 2015-10-12 ENCOUNTER — Inpatient Hospital Stay (HOSPITAL_COMMUNITY)
Admission: RE | Admit: 2015-10-12 | Discharge: 2015-10-15 | DRG: 467 | Disposition: A | Discharge status: Skilled Nursing Facility | Payer: Medicare Other | Source: Ambulatory Visit | Attending: Orthopedic Surgery | Admitting: Orthopedic Surgery

## 2015-10-12 DIAGNOSIS — M00062 Staphylococcal arthritis, left knee: Secondary | ICD-10-CM | POA: Diagnosis present

## 2015-10-12 DIAGNOSIS — T8454XS Infection and inflammatory reaction due to internal left knee prosthesis, sequela: Secondary | ICD-10-CM | POA: Diagnosis not present

## 2015-10-12 DIAGNOSIS — Z823 Family history of stroke: Secondary | ICD-10-CM

## 2015-10-12 DIAGNOSIS — Z96641 Presence of right artificial hip joint: Secondary | ICD-10-CM | POA: Diagnosis present

## 2015-10-12 DIAGNOSIS — Z7982 Long term (current) use of aspirin: Secondary | ICD-10-CM

## 2015-10-12 DIAGNOSIS — L02416 Cutaneous abscess of left lower limb: Secondary | ICD-10-CM | POA: Diagnosis present

## 2015-10-12 DIAGNOSIS — R0989 Other specified symptoms and signs involving the circulatory and respiratory systems: Secondary | ICD-10-CM | POA: Diagnosis not present

## 2015-10-12 DIAGNOSIS — Z8673 Personal history of transient ischemic attack (TIA), and cerebral infarction without residual deficits: Secondary | ICD-10-CM | POA: Diagnosis not present

## 2015-10-12 DIAGNOSIS — R531 Weakness: Secondary | ICD-10-CM | POA: Diagnosis not present

## 2015-10-12 DIAGNOSIS — E785 Hyperlipidemia, unspecified: Secondary | ICD-10-CM | POA: Diagnosis present

## 2015-10-12 DIAGNOSIS — Z79899 Other long term (current) drug therapy: Secondary | ICD-10-CM

## 2015-10-12 DIAGNOSIS — M1990 Unspecified osteoarthritis, unspecified site: Secondary | ICD-10-CM | POA: Diagnosis not present

## 2015-10-12 DIAGNOSIS — Z955 Presence of coronary angioplasty implant and graft: Secondary | ICD-10-CM | POA: Diagnosis not present

## 2015-10-12 DIAGNOSIS — F17210 Nicotine dependence, cigarettes, uncomplicated: Secondary | ICD-10-CM | POA: Diagnosis present

## 2015-10-12 DIAGNOSIS — I10 Essential (primary) hypertension: Secondary | ICD-10-CM | POA: Diagnosis present

## 2015-10-12 DIAGNOSIS — L03116 Cellulitis of left lower limb: Secondary | ICD-10-CM | POA: Diagnosis present

## 2015-10-12 DIAGNOSIS — Z86718 Personal history of other venous thrombosis and embolism: Secondary | ICD-10-CM

## 2015-10-12 DIAGNOSIS — R6520 Severe sepsis without septic shock: Secondary | ICD-10-CM | POA: Diagnosis not present

## 2015-10-12 DIAGNOSIS — Y831 Surgical operation with implant of artificial internal device as the cause of abnormal reaction of the patient, or of later complication, without mention of misadventure at the time of the procedure: Secondary | ICD-10-CM | POA: Diagnosis present

## 2015-10-12 DIAGNOSIS — T8459XA Infection and inflammatory reaction due to other internal joint prosthesis, initial encounter: Secondary | ICD-10-CM | POA: Diagnosis present

## 2015-10-12 DIAGNOSIS — M00862 Arthritis due to other bacteria, left knee: Secondary | ICD-10-CM | POA: Diagnosis not present

## 2015-10-12 DIAGNOSIS — T8454XA Infection and inflammatory reaction due to internal left knee prosthesis, initial encounter: Secondary | ICD-10-CM | POA: Diagnosis not present

## 2015-10-12 DIAGNOSIS — Y838 Other surgical procedures as the cause of abnormal reaction of the patient, or of later complication, without mention of misadventure at the time of the procedure: Secondary | ICD-10-CM | POA: Diagnosis not present

## 2015-10-12 DIAGNOSIS — M009 Pyogenic arthritis, unspecified: Secondary | ICD-10-CM | POA: Diagnosis present

## 2015-10-12 DIAGNOSIS — M25562 Pain in left knee: Secondary | ICD-10-CM | POA: Diagnosis not present

## 2015-10-12 DIAGNOSIS — J449 Chronic obstructive pulmonary disease, unspecified: Secondary | ICD-10-CM | POA: Diagnosis not present

## 2015-10-12 DIAGNOSIS — T8454XD Infection and inflammatory reaction due to internal left knee prosthesis, subsequent encounter: Secondary | ICD-10-CM | POA: Diagnosis not present

## 2015-10-12 DIAGNOSIS — Z96652 Presence of left artificial knee joint: Secondary | ICD-10-CM | POA: Diagnosis not present

## 2015-10-12 DIAGNOSIS — I739 Peripheral vascular disease, unspecified: Secondary | ICD-10-CM | POA: Diagnosis present

## 2015-10-12 DIAGNOSIS — Z87891 Personal history of nicotine dependence: Secondary | ICD-10-CM | POA: Diagnosis not present

## 2015-10-12 DIAGNOSIS — I714 Abdominal aortic aneurysm, without rupture: Secondary | ICD-10-CM | POA: Diagnosis not present

## 2015-10-12 DIAGNOSIS — M0080 Arthritis due to other bacteria, unspecified joint: Secondary | ICD-10-CM | POA: Diagnosis not present

## 2015-10-12 DIAGNOSIS — I723 Aneurysm of iliac artery: Secondary | ICD-10-CM | POA: Diagnosis not present

## 2015-10-12 DIAGNOSIS — T8450XD Infection and inflammatory reaction due to unspecified internal joint prosthesis, subsequent encounter: Secondary | ICD-10-CM | POA: Diagnosis not present

## 2015-10-12 DIAGNOSIS — I2510 Atherosclerotic heart disease of native coronary artery without angina pectoris: Secondary | ICD-10-CM | POA: Diagnosis present

## 2015-10-12 DIAGNOSIS — B9689 Other specified bacterial agents as the cause of diseases classified elsewhere: Secondary | ICD-10-CM | POA: Diagnosis not present

## 2015-10-12 DIAGNOSIS — Z96659 Presence of unspecified artificial knee joint: Secondary | ICD-10-CM | POA: Diagnosis present

## 2015-10-12 DIAGNOSIS — S81002D Unspecified open wound, left knee, subsequent encounter: Secondary | ICD-10-CM | POA: Diagnosis not present

## 2015-10-12 HISTORY — PX: I&D KNEE WITH POLY EXCHANGE: SHX5024

## 2015-10-12 LAB — BASIC METABOLIC PANEL
ANION GAP: 16 — AB (ref 5–15)
BUN: 15 mg/dL (ref 6–20)
CO2: 21 mmol/L — AB (ref 22–32)
CREATININE: 0.98 mg/dL (ref 0.44–1.00)
Calcium: 9.3 mg/dL (ref 8.9–10.3)
Chloride: 97 mmol/L — ABNORMAL LOW (ref 101–111)
GFR calc non Af Amer: 55 mL/min — ABNORMAL LOW (ref >60)
Glucose, Bld: 136 mg/dL — ABNORMAL HIGH (ref 65–99)
Potassium: 4.3 mmol/L (ref 3.5–5.1)
SODIUM: 134 mmol/L — AB (ref 135–145)

## 2015-10-12 LAB — CBC
HEMATOCRIT: 33.5 % — AB (ref 36.0–46.0)
HEMOGLOBIN: 10.7 g/dL — AB (ref 12.0–15.0)
MCH: 28.5 pg (ref 26.0–34.0)
MCHC: 31.9 g/dL (ref 30.0–36.0)
MCV: 89.3 fL (ref 78.0–100.0)
Platelets: 419 10*3/uL — ABNORMAL HIGH (ref 150–400)
RBC: 3.75 MIL/uL — ABNORMAL LOW (ref 3.87–5.11)
RDW: 14.7 % (ref 11.5–15.5)
WBC: 22.6 10*3/uL — AB (ref 4.0–10.5)

## 2015-10-12 SURGERY — IRRIGATION AND DEBRIDEMENT KNEE WITH POLY EXCHANGE
Anesthesia: General | Site: Knee | Laterality: Left

## 2015-10-12 MED ORDER — METOCLOPRAMIDE HCL 5 MG/ML IJ SOLN: 5.0000 mg | Freq: Three times a day (TID) | INTRAMUSCULAR | Status: DC | PRN

## 2015-10-12 MED ORDER — ONDANSETRON HCL 4 MG/2ML IJ SOLN
INTRAMUSCULAR | Status: DC | PRN
  Administered 2015-10-12: 4 mg via INTRAVENOUS

## 2015-10-12 MED ORDER — ACETAMINOPHEN 325 MG PO TABS: 325.0000 mg | ORAL_TABLET | ORAL | Status: DC | PRN

## 2015-10-12 MED ORDER — PHENOL 1.4 % MT LIQD: 1.0000 | OROMUCOSAL | Status: DC | PRN

## 2015-10-12 MED ORDER — TRAMADOL HCL 50 MG PO TABS: 50.0000 mg | ORAL_TABLET | Freq: Four times a day (QID) | ORAL | Status: DC | PRN

## 2015-10-12 MED ORDER — ACETAMINOPHEN 650 MG RE SUPP: 650.0000 mg | Freq: Four times a day (QID) | RECTAL | Status: DC | PRN

## 2015-10-12 MED ORDER — ASPIRIN EC 325 MG PO TBEC
325.0000 mg | DELAYED_RELEASE_TABLET | Freq: Every day | ORAL | Status: DC
  Administered 2015-10-13 – 2015-10-15 (×3): 325 mg via ORAL
  Filled 2015-10-12 (×3): qty 1

## 2015-10-12 MED ORDER — HYDROMORPHONE HCL 1 MG/ML IJ SOLN
0.2500 mg | INTRAMUSCULAR | Status: DC | PRN
  Administered 2015-10-12: 0.5 mg via INTRAVENOUS
  Administered 2015-10-12 (×2): 0.25 mg via INTRAVENOUS

## 2015-10-12 MED ORDER — SODIUM CHLORIDE 0.9 % IR SOLN
Status: DC | PRN
  Administered 2015-10-12: 3000 mL

## 2015-10-12 MED ORDER — VANCOMYCIN HCL 1000 MG IV SOLR
INTRAVENOUS | Status: AC
  Filled 2015-10-12: qty 1000

## 2015-10-12 MED ORDER — ONDANSETRON HCL 4 MG PO TABS: 4.0000 mg | ORAL_TABLET | Freq: Four times a day (QID) | ORAL | Status: DC | PRN

## 2015-10-12 MED ORDER — MENTHOL 3 MG MT LOZG: 1.0000 | LOZENGE | OROMUCOSAL | Status: DC | PRN

## 2015-10-12 MED ORDER — FENTANYL CITRATE (PF) 250 MCG/5ML IJ SOLN
INTRAMUSCULAR | Status: AC
  Filled 2015-10-12: qty 5

## 2015-10-12 MED ORDER — HYDROCHLOROTHIAZIDE 12.5 MG PO CAPS
12.5000 mg | ORAL_CAPSULE | Freq: Every day | ORAL | Status: DC
  Administered 2015-10-13 – 2015-10-15 (×3): 12.5 mg via ORAL
  Filled 2015-10-12 (×3): qty 1

## 2015-10-12 MED ORDER — ACETAMINOPHEN 160 MG/5ML PO SOLN
325.0000 mg | ORAL | Status: DC | PRN
  Filled 2015-10-12: qty 20.3

## 2015-10-12 MED ORDER — ACETAMINOPHEN 325 MG PO TABS: 650.0000 mg | ORAL_TABLET | Freq: Four times a day (QID) | ORAL | Status: DC | PRN

## 2015-10-12 MED ORDER — GENTAMICIN SULFATE 40 MG/ML IJ SOLN
240.0000 mg | INTRAMUSCULAR | Status: DC
  Filled 2015-10-12: qty 6

## 2015-10-12 MED ORDER — CEFAZOLIN SODIUM-DEXTROSE 2-3 GM-% IV SOLR
INTRAVENOUS | Status: DC | PRN
  Administered 2015-10-12: 2 g via INTRAVENOUS

## 2015-10-12 MED ORDER — CLONIDINE HCL 0.1 MG PO TABS
0.1000 mg | ORAL_TABLET | Freq: Two times a day (BID) | ORAL | Status: DC
  Administered 2015-10-14 – 2015-10-15 (×3): 0.1 mg via ORAL
  Filled 2015-10-12 (×3): qty 1

## 2015-10-12 MED ORDER — SODIUM CHLORIDE 0.9 % IV SOLN
INTRAVENOUS | Status: DC
  Administered 2015-10-13: 04:00:00 via INTRAVENOUS

## 2015-10-12 MED ORDER — KETOROLAC TROMETHAMINE 15 MG/ML IJ SOLN
INTRAMUSCULAR | Status: AC
  Filled 2015-10-12: qty 1

## 2015-10-12 MED ORDER — BISACODYL 10 MG RE SUPP: 10.0000 mg | Freq: Every day | RECTAL | Status: DC | PRN

## 2015-10-12 MED ORDER — CARVEDILOL 6.25 MG PO TABS
6.2500 mg | ORAL_TABLET | Freq: Two times a day (BID) | ORAL | Status: DC
  Administered 2015-10-13 – 2015-10-15 (×6): 6.25 mg via ORAL
  Filled 2015-10-12 (×5): qty 1

## 2015-10-12 MED ORDER — ONDANSETRON HCL 4 MG/2ML IJ SOLN
INTRAMUSCULAR | Status: AC
  Filled 2015-10-12: qty 2

## 2015-10-12 MED ORDER — ONDANSETRON HCL 4 MG/2ML IJ SOLN: 4.0000 mg | Freq: Four times a day (QID) | INTRAMUSCULAR | Status: DC | PRN

## 2015-10-12 MED ORDER — AMLODIPINE BESYLATE 10 MG PO TABS
10.0000 mg | ORAL_TABLET | Freq: Every day | ORAL | Status: DC
  Administered 2015-10-14 – 2015-10-15 (×2): 10 mg via ORAL
  Filled 2015-10-12: qty 1

## 2015-10-12 MED ORDER — LACTATED RINGERS IV SOLN
INTRAVENOUS | Status: DC | PRN
  Administered 2015-10-12: 22:00:00 via INTRAVENOUS

## 2015-10-12 MED ORDER — CEFAZOLIN SODIUM 1-5 GM-% IV SOLN
1.0000 g | Freq: Four times a day (QID) | INTRAVENOUS | Status: AC
  Administered 2015-10-13 (×2): 1 g via INTRAVENOUS
  Filled 2015-10-12 (×2): qty 50

## 2015-10-12 MED ORDER — OXYCODONE-ACETAMINOPHEN 5-325 MG PO TABS
ORAL_TABLET | ORAL | Status: AC
  Administered 2015-10-12: 1 via ORAL
  Filled 2015-10-12: qty 1

## 2015-10-12 MED ORDER — LISINOPRIL 40 MG PO TABS
40.0000 mg | ORAL_TABLET | Freq: Every day | ORAL | Status: DC
  Administered 2015-10-14 – 2015-10-15 (×2): 40 mg via ORAL
  Filled 2015-10-12: qty 1

## 2015-10-12 MED ORDER — GENTAMICIN SULFATE 40 MG/ML IJ SOLN
INTRAMUSCULAR | Status: DC | PRN
  Administered 2015-10-12: 240 mg

## 2015-10-12 MED ORDER — OXYCODONE HCL 5 MG PO TABS: 5.0000 mg | ORAL_TABLET | Freq: Once | ORAL | Status: DC | PRN

## 2015-10-12 MED ORDER — OXYCODONE HCL 5 MG/5ML PO SOLN: 5.0000 mg | Freq: Once | ORAL | Status: DC | PRN

## 2015-10-12 MED ORDER — HYDROMORPHONE HCL 1 MG/ML IJ SOLN
INTRAMUSCULAR | Status: AC
  Filled 2015-10-12: qty 1

## 2015-10-12 MED ORDER — FENTANYL CITRATE (PF) 100 MCG/2ML IJ SOLN
INTRAMUSCULAR | Status: AC
  Administered 2015-10-12: 25 ug
  Filled 2015-10-12: qty 2

## 2015-10-12 MED ORDER — GENTAMICIN SULFATE 40 MG/ML IJ SOLN
INTRAMUSCULAR | Status: AC
  Filled 2015-10-12: qty 6

## 2015-10-12 MED ORDER — LIDOCAINE HCL (CARDIAC) 20 MG/ML IV SOLN
INTRAVENOUS | Status: AC
  Filled 2015-10-12: qty 5

## 2015-10-12 MED ORDER — DOCUSATE SODIUM 100 MG PO CAPS
100.0000 mg | ORAL_CAPSULE | Freq: Two times a day (BID) | ORAL | Status: DC
  Administered 2015-10-13 – 2015-10-15 (×5): 100 mg via ORAL
  Filled 2015-10-12 (×5): qty 1

## 2015-10-12 MED ORDER — OXYCODONE-ACETAMINOPHEN 5-325 MG PO TABS
1.0000 | ORAL_TABLET | Freq: Once | ORAL | Status: AC
  Administered 2015-10-12: 1 via ORAL

## 2015-10-12 MED ORDER — 0.9 % SODIUM CHLORIDE (POUR BTL) OPTIME
TOPICAL | Status: DC | PRN
Start: 2015-10-12 — End: 2015-10-12
  Administered 2015-10-12: 1000 mL

## 2015-10-12 MED ORDER — LIDOCAINE HCL (CARDIAC) 20 MG/ML IV SOLN
INTRAVENOUS | Status: DC | PRN
  Administered 2015-10-12: 40 mg via INTRAVENOUS

## 2015-10-12 MED ORDER — TRANEXAMIC ACID 1000 MG/10ML IV SOLN
2000.0000 mg | Freq: Once | INTRAVENOUS | Status: DC
  Filled 2015-10-12: qty 20

## 2015-10-12 MED ORDER — FENTANYL CITRATE (PF) 100 MCG/2ML IJ SOLN
INTRAMUSCULAR | Status: DC | PRN
  Administered 2015-10-12: 150 ug via INTRAVENOUS
  Administered 2015-10-12: 25 ug via INTRAVENOUS
  Administered 2015-10-12: 50 ug via INTRAVENOUS
  Administered 2015-10-12: 25 ug via INTRAVENOUS

## 2015-10-12 MED ORDER — METOCLOPRAMIDE HCL 5 MG PO TABS: 5.0000 mg | ORAL_TABLET | Freq: Three times a day (TID) | ORAL | Status: DC | PRN

## 2015-10-12 MED ORDER — POLYETHYLENE GLYCOL 3350 17 G PO PACK: 17.0000 g | PACK | Freq: Every day | ORAL | Status: DC | PRN

## 2015-10-12 MED ORDER — VANCOMYCIN HCL 500 MG IV SOLR
250.0000 mg | INTRAVENOUS | Status: DC
  Filled 2015-10-12: qty 500

## 2015-10-12 MED ORDER — LACTATED RINGERS IV SOLN
INTRAVENOUS | Status: DC
  Administered 2015-10-12: 18:00:00 via INTRAVENOUS

## 2015-10-12 MED ORDER — METHOCARBAMOL 1000 MG/10ML IJ SOLN
500.0000 mg | Freq: Four times a day (QID) | INTRAVENOUS | Status: DC | PRN
  Filled 2015-10-12: qty 5

## 2015-10-12 MED ORDER — HYDROMORPHONE HCL 1 MG/ML IJ SOLN: 1.0000 mg | INTRAMUSCULAR | Status: DC | PRN

## 2015-10-12 MED ORDER — METHOCARBAMOL 500 MG PO TABS
500.0000 mg | ORAL_TABLET | Freq: Four times a day (QID) | ORAL | Status: DC | PRN
  Administered 2015-10-13 (×2): 500 mg via ORAL
  Filled 2015-10-12 (×2): qty 1

## 2015-10-12 MED ORDER — VANCOMYCIN HCL 1000 MG IV SOLR
INTRAVENOUS | Status: DC | PRN
  Administered 2015-10-12: 1000 mg

## 2015-10-12 MED ORDER — PROPOFOL 10 MG/ML IV BOLUS
INTRAVENOUS | Status: DC | PRN
  Administered 2015-10-12: 110 mg via INTRAVENOUS

## 2015-10-12 MED ORDER — OXYCODONE HCL 5 MG PO TABS
5.0000 mg | ORAL_TABLET | ORAL | Status: DC | PRN
  Administered 2015-10-13 – 2015-10-15 (×5): 10 mg via ORAL
  Filled 2015-10-12 (×5): qty 2

## 2015-10-12 MED ORDER — KETOROLAC TROMETHAMINE 15 MG/ML IJ SOLN
7.5000 mg | Freq: Four times a day (QID) | INTRAMUSCULAR | Status: AC
  Administered 2015-10-12 – 2015-10-13 (×4): 7.5 mg via INTRAVENOUS
  Filled 2015-10-12 (×5): qty 1

## 2015-10-12 MED ORDER — TRANEXAMIC ACID 1000 MG/10ML IV SOLN
1000.0000 mg | INTRAVENOUS | Status: DC | PRN
  Administered 2015-10-12: 1000 mg via INTRAVENOUS

## 2015-10-12 SURGICAL SUPPLY — 49 items
BLADE SURG 10 STRL SS (BLADE) ×3 IMPLANT
BLADE SURG 21 STRL SS (BLADE) ×3 IMPLANT
BNDG COHESIVE 4X5 TAN STRL (GAUZE/BANDAGES/DRESSINGS) IMPLANT
BNDG COHESIVE 6X5 TAN STRL LF (GAUZE/BANDAGES/DRESSINGS) ×3 IMPLANT
BNDG GAUZE ELAST 4 BULKY (GAUZE/BANDAGES/DRESSINGS) ×4 IMPLANT
CONT SPECI 4OZ STER CLIK (MISCELLANEOUS) ×2 IMPLANT
COVER SURGICAL LIGHT HANDLE (MISCELLANEOUS) ×6 IMPLANT
DRAPE U-SHAPE 47X51 STRL (DRAPES) ×3 IMPLANT
DRSG ADAPTIC 3X8 NADH LF (GAUZE/BANDAGES/DRESSINGS) ×3 IMPLANT
DURAPREP 26ML APPLICATOR (WOUND CARE) ×3 IMPLANT
ELECT CAUTERY BLADE 6.4 (BLADE) IMPLANT
ELECT REM PT RETURN 9FT ADLT (ELECTROSURGICAL)
ELECTRODE REM PT RTRN 9FT ADLT (ELECTROSURGICAL) IMPLANT
GAUZE SPONGE 4X4 12PLY STRL (GAUZE/BANDAGES/DRESSINGS) ×3 IMPLANT
GLOVE BIOGEL PI IND STRL 9 (GLOVE) ×1 IMPLANT
GLOVE BIOGEL PI INDICATOR 9 (GLOVE) ×2
GLOVE SURG ORTHO 9.0 STRL STRW (GLOVE) ×3 IMPLANT
GOWN STRL REUS W/ TWL LRG LVL3 (GOWN DISPOSABLE) ×1 IMPLANT
GOWN STRL REUS W/ TWL XL LVL3 (GOWN DISPOSABLE) ×2 IMPLANT
GOWN STRL REUS W/TWL LRG LVL3 (GOWN DISPOSABLE) ×3
GOWN STRL REUS W/TWL XL LVL3 (GOWN DISPOSABLE) ×6
HANDPIECE INTERPULSE COAX TIP (DISPOSABLE) ×3
IMMOBILIZER KNEE 20 (SOFTGOODS) ×3 IMPLANT
INSERT TIBIAL ATTUNE POST 10MM (Knees) ×2 IMPLANT
KIT BASIN OR (CUSTOM PROCEDURE TRAY) ×3 IMPLANT
KIT ROOM TURNOVER OR (KITS) ×3 IMPLANT
KIT STIMULAN RAPID CURE  10CC (Orthopedic Implant) ×2 IMPLANT
KIT STIMULAN RAPID CURE 10CC (Orthopedic Implant) IMPLANT
MANIFOLD NEPTUNE II (INSTRUMENTS) ×3 IMPLANT
NS IRRIG 1000ML POUR BTL (IV SOLUTION) ×3 IMPLANT
PACK ORTHO EXTREMITY (CUSTOM PROCEDURE TRAY) ×3 IMPLANT
PAD ABD 8X10 STRL (GAUZE/BANDAGES/DRESSINGS) ×2 IMPLANT
PAD ARMBOARD 7.5X6 YLW CONV (MISCELLANEOUS) ×6 IMPLANT
SET HNDPC FAN SPRY TIP SCT (DISPOSABLE) IMPLANT
SPONGE GAUZE 4X4 12PLY STER LF (GAUZE/BANDAGES/DRESSINGS) ×2 IMPLANT
SPONGE LAP 18X18 X RAY DECT (DISPOSABLE) ×2 IMPLANT
STOCKINETTE IMPERVIOUS 9X36 MD (GAUZE/BANDAGES/DRESSINGS) ×2 IMPLANT
SUT ETHILON 2 0 PSLX (SUTURE) ×4 IMPLANT
SUT PDS AB 1 CT  36 (SUTURE) ×4
SUT PDS AB 1 CT 36 (SUTURE) IMPLANT
SWAB COLLECTION DEVICE MRSA (MISCELLANEOUS) ×3 IMPLANT
TOWEL OR 17X24 6PK STRL BLUE (TOWEL DISPOSABLE) ×3 IMPLANT
TOWEL OR 17X26 10 PK STRL BLUE (TOWEL DISPOSABLE) ×3 IMPLANT
TUBE ANAEROBIC SPECIMEN COL (MISCELLANEOUS) ×6 IMPLANT
TUBE CONNECTING 12'X1/4 (SUCTIONS) ×1
TUBE CONNECTING 12X1/4 (SUCTIONS) ×2 IMPLANT
UNDERPAD 30X30 INCONTINENT (UNDERPADS AND DIAPERS) ×3 IMPLANT
WATER STERILE IRR 1000ML POUR (IV SOLUTION) ×3 IMPLANT
YANKAUER SUCT BULB TIP NO VENT (SUCTIONS) ×3 IMPLANT

## 2015-10-12 NOTE — Anesthesia Procedure Notes (Signed)
Procedure Name: LMA Insertion Date/Time: 10/12/2015 9:51 PM Performed by: Tressia Miners LEFFEW Pre-anesthesia Checklist: Patient identified, Emergency Drugs available, Suction available, Timeout performed and Patient being monitored Patient Re-evaluated:Patient Re-evaluated prior to inductionOxygen Delivery Method: Circle system utilized Preoxygenation: Pre-oxygenation with 100% oxygen Intubation Type: IV induction Ventilation: Mask ventilation without difficulty LMA: LMA inserted LMA Size: 4.0 Number of attempts: 1 Tube secured with: Tape Dental Injury: Teeth and Oropharynx as per pre-operative assessment

## 2015-10-12 NOTE — H&P (Signed)
[name] is an 75 y.o. female.   Chief Complaint: Left knee pain HPI: Patient is a 75 year old woman is 2 weeks status post a left total knee arthroplasty. Patient had some pain redness and some drainage was seen in the office today and was referred to the hospital for irrigation and debridement polyethylene exchange and IV antibiotics.  Past Medical History  Diagnosis Date  . CAD (coronary artery disease)   . HTN (hypertension)   . Hyperlipidemia   . Cerebrovascular disease   . Arthritis   . PVD (peripheral vascular disease) ([place])     dvt in leg    Past Surgical History  Procedure Laterality Date  . Knee surgery      denies  . Abdominal hysterectomy    . Total hip arthroplasty Right 09/10/2012    Dr Sharol Given  . Total hip arthroplasty Right 09/10/2012    Procedure: TOTAL HIP ARTHROPLASTY;  Surgeon: Newt Minion, MD;  Location: [place];  Service: Orthopedics;  Laterality: Right;  Right Total Hip Arthroplasty  . Coronary angioplasty  07    3 stents placed  . Total knee arthroplasty Left 09/28/2015    Procedure: TOTAL KNEE ARTHROPLASTY;  Surgeon: Newt Minion, MD;  Location: [place];  Service: Orthopedics;  Laterality: Left;    Family History  Problem Relation Age of Onset  . Breast cancer Mother   . Leukemia Father   . Stroke Sister   . Bone cancer     Social History:  reports that she has been smoking Cigarettes.  She has a 25 pack-year smoking history. She has never used smokeless tobacco. She reports that she does not drink alcohol or use illicit drugs.  Allergies: No Known Allergies  Medications Prior to Admission  Medication Sig Dispense Refill  . amLODipine (NORVASC) 10 MG tablet TAKE 1 TABLET (10 MG TOTAL) BY MOUTH DAILY. (Patient taking differently: Take 10 mg by mouth daily. ) 90 tablet 3  . aspirin 81 MG tablet Take 81 mg by mouth daily.      [name] aspirin EC 325 MG tablet Take 1 tablet (325 mg total) by mouth daily. 30 tablet 0  . atorvastatin (LIPITOR) 40 MG tablet  TAKE ONE TABLET BY MOUTH DAILY. (Patient taking differently: Take 40 mg by mouth daily at 6 PM. ) 90 tablet 0  . carvedilol (COREG) 6.25 MG tablet TAKE 1 TABLET (6.25 MG TOTAL) BY MOUTH 2 (TWO) TIMES DAILY WITH A MEAL. KEEP APPT FOR REFILLS (Patient taking differently: Take 6.25 mg by mouth 2 (two) times daily with a meal. ) 60 tablet 1  . cloNIDine (CATAPRES) 0.1 MG tablet Take 1 tablet (0.1 mg total) by mouth 2 (two) times daily. 60 tablet 2  . colchicine 0.6 MG tablet Take 0.6-1.2 mg by mouth daily as needed. gout    . diclofenac (VOLTAREN) 75 MG EC tablet Take 75 mg by mouth 2 (two) times daily.     . Glucosamine-Chondroit-Vit C-Mn (GLUCOSAMINE-CHONDROITIN) CAPS Take 1 capsule by mouth daily.    . hydrochlorothiazide (MICROZIDE) 12.5 MG capsule TAKE 1 CAPSULE (12.5 MG TOTAL) BY MOUTH EVERY MORNING. (Patient taking differently: Take 12.5 mg by mouth daily. ) 90 capsule 3  . lisinopril (PRINIVIL,ZESTRIL) 40 MG tablet TAKE 1 TABLET (40 MG TOTAL) BY MOUTH DAILY. (Patient taking differently: Take 40 mg by mouth daily. ) 90 tablet 3  . Multiple Vitamin (MULITIVITAMIN WITH MINERALS) TABS Take 1 tablet by mouth daily.    . traMADol (ULTRAM) 50 MG  tablet Take 50 mg by mouth every 6 (six) hours as needed.    . traMADol (ULTRAM) 50 MG tablet Take 1 tablet (50 mg total) by mouth every 6 (six) hours as needed for moderate pain. 30 tablet 0    Results for orders placed or performed during the hospital encounter of 10/12/15 (from the past 48 hour(s))  CBC     Status: Abnormal   Collection Time: 10/12/15  6:25 PM  Result Value Ref Range   WBC 22.6 (H) 4.0 - 10.5 K/uL   RBC 3.75 (L) 3.87 - 5.11 MIL/uL   Hemoglobin 10.7 (L) 12.0 - 15.0 g/dL   HCT 33.5 (L) 36.0 - 46.0 %   MCV 89.3 78.0 - 100.0 fL   MCH 28.5 26.0 - 34.0 pg   MCHC 31.9 30.0 - 36.0 g/dL   RDW 14.7 11.5 - 15.5 %   Platelets 419 (H) 150 - 400 K/uL  Basic metabolic panel     Status: Abnormal   Collection Time: 10/12/15  6:25 PM  Result Value  Ref Range   Sodium 134 (L) 135 - 145 mmol/L   Potassium 4.3 3.5 - 5.1 mmol/L   Chloride 97 (L) 101 - 111 mmol/L   CO2 21 (L) 22 - 32 mmol/L   Glucose, Bld 136 (H) 65 - 99 mg/dL   BUN 15 6 - 20 mg/dL   Creatinine, Ser 0.98 0.44 - 1.00 mg/dL   Calcium 9.3 8.9 - 10.3 mg/dL   GFR calc non Af Amer 55 (L) >60 mL/min   GFR calc Af Amer >60 >60 mL/min    Comment: (NOTE) The eGFR has been calculated using the CKD EPI equation. This calculation has not been validated in all clinical situations. eGFR's persistently <60 mL/min signify possible Chronic Kidney Disease.    Anion gap 16 (H) 5 - 15   No results found.  Review of Systems  All other systems reviewed and are negative.   Blood pressure 118/41, pulse 55, temperature 98.3 F (36.8 C), temperature source Oral, resp. rate 18, weight 55.339 kg (122 lb), SpO2 99 %. Physical Exam  On examination patient has cellulitis of an area about 4 cm in diameter around the left knee over the patella. She does have a mild effusion there is some serosanguineous drainage her white cell count is 22,000 patient has pain with range of motion of the knee. Assessment/Plan Assessment: Infected left total knee arthroplasty.  Plan: This is an acute infection less than 3 weeks out from her index surgery patient should be a good candidate for salvage of her total knee replacement. We'll plan for irrigation and debridement of the left knee open removal of the polyethylene tray placement of a new polyethylene tray and placement of antibiotic beads in the knee with both vancomycin and gentamicin. Patient will need 6 weeks IV antibiotics through a PICC line. Plan for discharge back to skilled nursing. Of note patient did receive a dose of Rocephin at her rehabilitation facility. I feel this will probably invalidate all of our cultures but will also send some soft tissue for cultures will consult infectious disease as well.  Newt Minion, MD 10/12/2015, 7:16 PM

## 2015-10-12 NOTE — Op Note (Addendum)
10/12/2015  11:09 PM  PATIENT:  [name]    PRE-OPERATIVE DIAGNOSIS:  Infected Left Total Knee  POST-OPERATIVE DIAGNOSIS:  Same  PROCEDURE:  Complete synovectomy , removal and placement of new tibial polyethylene tray, placement Antibiotic Beads Left Knee  SURGEON:  Newt Minion, MD  PHYSICIAN ASSISTANT:None ANESTHESIA:   General  PREOPERATIVE INDICATIONS:  [name] is a  75 y.o. female with a diagnosis of Infected Left Total Knee who failed conservative measures and elected for surgical management.    The risks benefits and alternatives were discussed with the patient preoperatively including but not limited to the risks of infection, bleeding, nerve injury, cardiopulmonary complications, the need for revision surgery, among others, and the patient was willing to proceed.  OPERATIVE IMPLANTS: 10 mg stimulant beads with 1 g vancomycin and 240 mg gentamicin  OPERATIVE FINDINGS: Abscess left knee. Superficial cultures obtained 2 deep cultures obtained 2 and deep tissue cultures also obtained. Patient received Rocephin at the skilled nursing facility prior to obtaining cultures.  OPERATIVE PROCEDURE: Patient was brought to the operating room and underwent general anesthetic. After adequate levels anesthesia obtained patient's left lower extremity was prepped using DuraPrep draped into a sterile field. A timeout was called. The staples and the 2 suture lines were removed. Superficial cultures were obtained and deep intra-articular cultures were both obtained 2. The knee was irrigated with pulsatile lavage. A complete synovectomy was performed using a Ronjair and a 10 blade knife. The polyethylene tray was removed further debridement and the knee was irrigated with pulsatile lavage for 3 L. After synovectomy, debridement and irrigation the drapes were changed gloves were changed prior to implantation of the new tibial tray and the antibiotic beads. The new polyethylene tray was  placed and the stimulant beads with 10 mL of stimulant powder 1 g of vancomycin powder and 240 mg of gentamicin liquid were placed within the joint. The deep layer was closed using #1 PDS suture. The skin was closed using 2-0 nylon. Patient was placed in a knee immobilizer. Patient was extubated taken to PACU in stable condition. Plan for infectious disease consultation tomorrow plan for IV antibiotics through a PICC line for 6 weeks.

## 2015-10-12 NOTE — Transfer of Care (Signed)
Immediate Anesthesia Transfer of Care Note  Patient: [name]  Procedure(s) Performed: Procedure(s): Irrigation and Debridement , Poly Exchange, Antibiotic Beads Left Knee (Left)  Patient Location: PACU  Anesthesia Type:General  Level of Consciousness: awake, alert  and oriented  Airway & Oxygen Therapy: Patient connected to nasal cannula oxygen  Post-op Assessment: Report given to RN, Post -op Vital signs reviewed and stable and Patient moving all extremities X 4  Post vital signs: Reviewed and stable  Last Vitals:  Filed Vitals:   10/12/15 1756 10/12/15 2311  BP: 118/41   Pulse: 55 105  Temp: 36.8 C 36.3 C  Resp: 18 20    Complications: No apparent anesthesia complications

## 2015-10-12 NOTE — Anesthesia Preprocedure Evaluation (Addendum)
Anesthesia Evaluation  Patient identified by MRN, date of birth, ID band Patient awake    Reviewed: Allergy & Precautions, NPO status , Patient's Chart, lab work & pertinent test results  History of Anesthesia Complications Negative for: history of anesthetic complications  Airway Mallampati: II  TM Distance: >3 FB Neck ROM: Full    Dental  (+) Edentulous Upper, Edentulous Lower   Pulmonary neg sleep apnea, COPD, neg recent URI, Current Smoker,    breath sounds clear to auscultation       Cardiovascular hypertension, Pt. on medications + CAD and + Peripheral Vascular Disease   Rhythm:Regular     Neuro/Psych PSYCHIATRIC DISORDERS    GI/Hepatic negative GI ROS, Neg liver ROS,   Endo/Other  negative endocrine ROS  Renal/GU negative Renal ROS     Musculoskeletal  (+) Arthritis ,   Abdominal   Peds  Hematology  (+) anemia ,   Anesthesia Other Findings   Reproductive/Obstetrics                           Anesthesia Physical Anesthesia Plan  ASA: III  Anesthesia Plan: General   Post-op Pain Management:    Induction: Intravenous  Airway Management Planned: LMA  Additional Equipment: None  Intra-op Plan:   Post-operative Plan: Extubation in OR  Informed Consent: I have reviewed the patients History and Physical, chart, labs and discussed the procedure including the risks, benefits and alternatives for the proposed anesthesia with the patient or authorized representative who has indicated his/her understanding and acceptance.   Dental advisory given  Plan Discussed with: CRNA and Surgeon  Anesthesia Plan Comments:         Anesthesia Quick Evaluation

## 2015-10-12 NOTE — Progress Notes (Signed)
Report given to [facility] for room [phone].  Pt VSS and transported to room by [facility] NT.  Pt needs continuous pulse ox box but one not available at this time.  Floor will order.

## 2015-10-13 ENCOUNTER — Encounter (HOSPITAL_COMMUNITY): Payer: Self-pay | Admitting: Orthopedic Surgery

## 2015-10-13 ENCOUNTER — Encounter: Payer: Medicare Other | Admitting: Cardiology

## 2015-10-13 DIAGNOSIS — T8454XA Infection and inflammatory reaction due to internal left knee prosthesis, initial encounter: Principal | ICD-10-CM

## 2015-10-13 DIAGNOSIS — M00862 Arthritis due to other bacteria, left knee: Secondary | ICD-10-CM

## 2015-10-13 DIAGNOSIS — B9689 Other specified bacterial agents as the cause of diseases classified elsewhere: Secondary | ICD-10-CM

## 2015-10-13 DIAGNOSIS — Y838 Other surgical procedures as the cause of abnormal reaction of the patient, or of later complication, without mention of misadventure at the time of the procedure: Secondary | ICD-10-CM

## 2015-10-13 MED ORDER — VANCOMYCIN HCL 500 MG IV SOLR
500.0000 mg | Freq: Two times a day (BID) | INTRAVENOUS | Status: DC
  Administered 2015-10-13 – 2015-10-15 (×5): 500 mg via INTRAVENOUS
  Filled 2015-10-13 (×6): qty 500

## 2015-10-13 MED ORDER — LEVOFLOXACIN 500 MG PO TABS
750.0000 mg | ORAL_TABLET | ORAL | Status: DC
  Administered 2015-10-13 – 2015-10-15 (×2): 750 mg via ORAL
  Filled 2015-10-13 (×3): qty 2

## 2015-10-13 MED ORDER — OXYCODONE HCL 5 MG PO TABS: 5.0000 mg | ORAL_TABLET | ORAL | Status: DC | PRN

## 2015-10-13 MED ORDER — LEVOFLOXACIN 750 MG PO TABS: 750.0000 mg | ORAL_TABLET | ORAL | Status: DC

## 2015-10-13 MED ORDER — VANCOMYCIN HCL 500 MG IV SOLR: 500.0000 mg | Freq: Two times a day (BID) | INTRAVENOUS | Status: DC

## 2015-10-13 NOTE — Progress Notes (Signed)
Re-addressed PICC line placement with patient, son via telephone and daughter-in-law. Pt and dtr-in-law just completed watching the educational video on PICC's.  Pt following conversation better currently, but continues to be easily distracted and unable to stay focused on topic.  Did not ask any specific questions regarding PICC to confirm understanding, but stated understood procedure.  Family asked pertinent questions.  When asked pt if she had any questions, she looked at my name badge and asked if I work at [employer].  States still does not want PICC today, acknowledging that she is still "fuzzy minded" and unclear.  States she wants to sign consent when she feels more clear minded.  Also states that her son may sign consent for her this weekend if she doesn't feel better.  Family verbalize agreement and understanding of pt request. Son and Dtr-in-law understands d/c plan for Sunday or Monday and PICC needs to be placed prior to d/c. Will re-evaluate for consent 10-14-15 or over this weekend.

## 2015-10-13 NOTE — Progress Notes (Signed)
ANTIBIOTIC CONSULT NOTE - INITIAL  Pharmacy Consult for Vancomycin Indication: prosthetic joint infection  No Known Allergies  Patient Measurements: Height: 5' 3.5" (161.3 cm) Weight: 122 lb (55.339 kg) IBW/kg (Calculated) : 53.55  Vital Signs: Temp: 98 F (36.7 C) (03/16 0500) Temp Source: Oral (03/16 0006) BP: 106/57 mmHg (03/16 0811) Pulse Rate: 86 (03/16 0811)  Labs:  Recent Labs  10/12/15 1825  WBC 22.6*  HGB 10.7*  PLT 419*  CREATININE 0.98   Estimated Creatinine Clearance: 42.6 mL/min (by C-G formula based on Cr of 0.98).  Microbiology:  3/15 wound and tissue cultures pending  2/21 MRSA screen negative  Medical History: Past Medical History  Diagnosis Date  . CAD (coronary artery disease)   . HTN (hypertension)   . Hyperlipidemia   . Cerebrovascular disease   . Arthritis   . PVD (peripheral vascular disease) ([place])     dvt in leg   Assessment:  75 yr old female with hx left TKA 09/28/15, now s/p I&D and polyexchange last night. Received Ancef pre-op and post-op x 2 doses. Now changing to Vancomycin and PO Levaquin. Expecting 6 wks antibiotics.  Intraoperative cultures pending.  Goal of Therapy:  Vancomycin trough level 15-20 mcg/ml  Plan:   Vancomycin 500 mg IV q12hrs.  Levaquin 750 mg PO adjusted from daily to q48hrs for crcl ~40-45.  Follow renal function, culture data, progress.  Vanc trough level at steady state.  Arty Baumgartner, [place] Pager: [phone] 10/13/2015,11:39 AM

## 2015-10-13 NOTE — Evaluation (Addendum)
Physical Therapy Evaluation Patient Details Name: [name] MRN: [id number] DOB: 02-28-1941 Today's Date: 10/13/2015   History of Present Illness  Patient is a 75 yo female admitted s/p Lt TKA infection, now s/p removal and placement of new tibial polyethylene tray, placement Antibiotic Beads Left Knee. PMH:  CAD, stents x3, HTN, cerebrovascular disease, PVD, arthritis, Rt THA  Clinical Impression  Patient demonstrates deficits in functional mobility as indicated below. Will need continued skilled PT to address deficits and maximize function. Will see as indicated and progress as tolerated. Will need continued rehabilitation post acute discharge. Recommend return to SNF.    Follow Up Recommendations SNF    Equipment Recommendations  Rolling walker with 5" wheels;3in1 (PT)    Recommendations for Other Services       Precautions / Restrictions Precautions Required Braces or Orthoses: Knee Immobilizer - Left Knee Immobilizer - Left: On at all times Restrictions Weight Bearing Restrictions: Yes LLE Weight Bearing: Weight bearing as tolerated      Mobility  Bed Mobility Overal bed mobility: Needs Assistance Bed Mobility: Sit to Supine     Supine to sit: Min assist Sit to supine: Min assist   General bed mobility comments: assist for positioning, patient partially near EOB upon arrival but required assist to come to complete EOb positioning. Assist for elevation of LEs back to bed when returning to supine  Transfers Overall transfer level: Needs assistance Equipment used: Rolling walker (2 wheeled)   Sit to Stand: Mod assist Stand pivot transfers: Min assist (pivot from [facility] back to bed)       General transfer comment: VCs for hand placement and safety, assist for positioning of LEs prior to elevation to standing. Assist to power up to upright position. mis assist for stability during pivot to chair (manual assist for hand placement)  Ambulation/Gait Ambulation/Gait  assistance: Min assist Ambulation Distance (Feet): 6 Feet Assistive device: Rolling walker (2 wheeled) Gait Pattern/deviations: Step-to pattern;Narrow base of support;Trunk flexed   Gait velocity interpretation: <1.8 ft/sec, indicative of risk for recurrent falls General Gait Details: heavy reliance on RW, able to perform step to technique to take minimal steps to head to bathroom, transitioned to use of [facility]  Stairs            Wheelchair Mobility    Modified Rankin (Stroke Patients Only)       Balance     Sitting balance-Leahy Scale: Good     Standing balance support: Bilateral upper extremity supported Standing balance-Leahy Scale: Poor Standing balance comment: Heavy reliance on UE support                             Pertinent Vitals/Pain Pain Assessment: Faces Faces Pain Scale: Hurts a little bit Pain Location: left knee Pain Descriptors / Indicators: Guarding    Home Living Family/patient expects to be discharged to:: Skilled nursing facility Living Arrangements: Alone                    Prior Function Level of Independence: Needs assistance         Comments: was a SNF for rehabilitation     Hand Dominance   Dominant Hand: Right    Extremity/Trunk Assessment                         Communication      Cognition Arousal/Alertness: Awake/alert Behavior During Therapy: Anxious Overall Cognitive  Status: Impaired/Different from baseline Area of Impairment: Safety/judgement;Problem solving;Attention   Current Attention Level: Selective Memory: Decreased short-term memory Following Commands: Follows one step commands with increased time Safety/Judgement: Decreased awareness of safety   Problem Solving: Slow processing;Requires verbal cues General Comments: Per chart review, patient has similar issues during previous hospitalizations.     General Comments      Exercises        Assessment/Plan    PT  Assessment Patient needs continued PT services  PT Diagnosis Difficulty walking;Acute pain;Altered mental status   PT Problem List Decreased strength;Decreased range of motion;Decreased activity tolerance;Decreased balance;Decreased mobility;Decreased cognition;Decreased knowledge of use of DME;Decreased safety awareness;Decreased knowledge of precautions;Pain  PT Treatment Interventions DME instruction;Gait training;Functional mobility training;Therapeutic activities;Therapeutic exercise;Cognitive remediation;Patient/family education   PT Goals (Current goals can be found in the Care Plan section) Acute Rehab PT Goals Patient Stated Goal: none stated PT Goal Formulation: With patient Time For Goal Achievement: 10/27/15 Potential to Achieve Goals: Good    Frequency 7X/week   Barriers to discharge Decreased caregiver support      Co-evaluation               End of Session Equipment Utilized During Treatment: Gait belt;Oxygen Activity Tolerance: Patient tolerated treatment well Patient left: in bed;with call bell/phone within reach;with bed alarm set Nurse Communication: Mobility status (Decreased cognition)         Time: 6237-6283 PT Time Calculation (min) (ACUTE ONLY): 18 min   Charges:   PT Evaluation $PT Eval Moderate Complexity: 1 Procedure     PT G CodesDuncan Dull 2015-11-06, 9:30 AM Alben Deeds, PT DPT  [phone]

## 2015-10-13 NOTE — Clinical Social Work Note (Signed)
Clinical Social Work Assessment  Patient Details  Name: [name] MRN: [id number] Date of Birth: [birth year]  Date of referral:  10/13/15               Reason for consult:  Facility Placement                Permission sought to share information with:  Family Supports, Chartered certified accountant granted to share information::  Yes, Verbal Permission Granted  Name::      (sons and daughter in Sports coach)  Agency::   ([facility])  Relationship::     Contact Information:     Housing/Transportation Living arrangements for the past 2 months:  [place], [place] of Information:  Adult Children Patient Interpreter Needed:  None Criminal Activity/Legal Involvement Pertinent to Current Situation/Hospitalization:    Significant Relationships:  Adult Children Lives with:  Self Do you feel safe going back to the place where you live?  No (will need to continue SNF/STR) Need for family participation in patient care:  Yes (Comment) (patient reqeusts)  Care giving concerns:  Concerns expressed re: bed hold at [facility]  Social Worker assessment / plan:  CSW met with daughter-in-law to review disposition. Patient has been at [facility] for approximately 10 days prior to this admission for STR.  Patient and family state she wishes to return to SNF to complete STR at time of discharge.  CSW spoke with liaison at [facility] to confirm patient's ability to return and bed hold.  Liaison confirmed facility is holding a bed for the patient and has updated family.  Employment status:  Retired Forensic scientist:  Medicare PT Recommendations:  [place] / Referral to community resources:  [place]  Patient/Family's Response to care:  Patient and family wish to return to SNF at time of discharge.  Patient/Family's Understanding of and Emotional Response to Diagnosis, Current Treatment, and Prognosis:  Patient  and family are realistic regarding patient's needed therapy and need for STR at SNF at time of discharge.  Emotional Assessment Appearance:  Appears stated age Attitude/Demeanor/Rapport:    Affect (typically observed):  Accepting Orientation:  Oriented to Self, Oriented to Place, Oriented to  Time, Oriented to Situation Alcohol / Substance use:  Not Applicable Psych involvement (Current and /or in the community):  No (Comment)  Discharge Needs  Concerns to be addressed:  No discharge needs identified Readmission within the last 30 days:  Yes Current discharge risk:  None Barriers to Discharge:  Continued Medical Work up   [employer], LCSW 10/13/2015, 12:01 PM

## 2015-10-13 NOTE — Progress Notes (Signed)
Explained PICC to pt.  Pt refuses at this time and states does not want it today.  Pt states, and does appear to be,slightly disoriented and having difficulty following conversation.  States she signs her own consents and does not want son to give consent.  During explanation, pt asked if we were putting a"telephone" line in her. Repeatedly states she understands she needs something but "not today".  [place] notified.  RN to show PICC education video to pt and son (not at bedside) today and notify VAS Team if pt is agreeable for PICC placement later today.

## 2015-10-13 NOTE — Progress Notes (Signed)
Utilization review completed.  

## 2015-10-13 NOTE — Progress Notes (Signed)
Patient ID: [name], female   DOB: April 14, 1941, 75 y.o.   MRN: [id number] Patient states that her left knee feels stiff. She does complain of pain in her right knee and requested a steroid injection. Discussed that we will hold on this until we are further along the treatment of her septic left knee. Postoperative day 1 status post synovectomy left knee replacement of the polyethylene liner and placement of antibiotic beads. Cultures were obtained 4 as well as tissue cultures. Plan for discharge to skilled nursing with a PICC line and antibiotics per infectious disease consult. Patient did have intra-articular purulence and will need at least 6 weeks of IV antibiotics. She is 2 weeks out from her index knee replacement.

## 2015-10-13 NOTE — Consult Note (Signed)
  [facility]       Reason for Consult: PJI    Referring Physician: Dr. Duda  Active Problems:   Septic arthritis of knee, left (HCC)   . amLODipine  10 mg Oral Daily  . aspirin EC  325 mg Oral Q breakfast  . carvedilol  6.25 mg Oral BID WC  . cloNIDine  0.1 mg Oral BID  . docusate sodium  100 mg Oral BID  . hydrochlorothiazide  12.5 mg Oral Daily  . ketorolac  7.5 mg Intravenous 4 times per day  . ketorolac      . levofloxacin  750 mg Oral Daily  . lisinopril  40 mg Oral Daily    Recommendations: Vancomycin per pharmacy levaquin 750 mg daily Will adjust depending on culture Will need for 6 weeks Looks like going to rehab PICC line ordered already  Will check crp, ESR  Assessment: She has early PJI s/p polyexchange.  She will need 6 weeks of IV therapy followed by 3-6 months oral continuation.     Antibiotics: Vancomycin and levaquin starting  HPI: [name] is a 74 y.o. female with osteoarthritis of the left knee, s/p TKA on 3/1 who was at rehab and doing well until 2 days ago started to have more pain then swelling, drainage when at her office visit with Dr. Duda and brought in yesterday for I and D.  Underwent polyexhcange.  No recent fever or chills.  Patient seen with her daughter in law.  No complaints.  Going back to rehab.   Review of Systems:  Constitutional: negative for fevers and chills Respiratory: negative for cough Musculoskeletal: negative for myalgias, has some left knee pain All other systems reviewed and are negative   Past Medical History  Diagnosis Date  . CAD (coronary artery disease)   . HTN (hypertension)   . Hyperlipidemia   . Cerebrovascular disease   . Arthritis   . PVD (peripheral vascular disease) (HCC)     dvt in leg    Social History  Substance Use Topics  . Smoking status: Current Every Day Smoker -- 0.50 packs/day for 50 years    Types: Cigarettes  . Smokeless tobacco: Never Used  .  Alcohol Use: No    Family History  Problem Relation Age of Onset  . Breast cancer Mother   . Leukemia Father   . Stroke Sister   . Bone cancer      No Known Allergies  Physical Exam: Constitutional: in no apparent distress and alert  Filed Vitals:   10/13/15 0810 10/13/15 0811  BP: 106/57 106/57  Pulse:  86  Temp:    Resp:     EYES: anicteric ENMT: no thrush Cardiovascular: Cor RRR and No murmurs Respiratory: CTAB; normal respiratory effort GI: Bowel sounds are normal, liver is not enlarged, spleen is not enlarged Musculoskeletal: peripheral pulses normal, no pedal edema, no clubbing or cyanosis Skin: negatives: no rash Hematologic: no cervical lad  Lab Results  Component Value Date   WBC 22.6* 10/12/2015   HGB 10.7* 10/12/2015   HCT 33.5* 10/12/2015   MCV 89.3 10/12/2015   PLT 419* 10/12/2015    Lab Results  Component Value Date   CREATININE 0.98 10/12/2015   BUN 15 10/12/2015   NA 134* 10/12/2015   K 4.3 10/12/2015   CL 97* 10/12/2015   CO2 21* 10/12/2015    Lab Results  Component Value Date   ALT 18 09/20/2015   AST   22 09/20/2015   ALKPHOS 115 09/20/2015     Microbiology: No results found for this or any previous visit (from the past 240 hour(s)).  COMER, ROBERT, MD [facility] [email or web address] [phone] pager  [phone] cell 10/13/2015, 11:01 AM 

## 2015-10-13 NOTE — Discharge Summary (Signed)
Physician Discharge Summary  Patient ID: [name] MRN: [id number] DOB/AGE: 75-Jul-1942 75 y.o.  Admit date: 10/12/2015 Discharge date: 10/13/2015  Admission Diagnoses:Septic left knee  Discharge Diagnoses:  Active Problems:   Septic arthritis of knee, left [facility])   Discharged Condition: stable  Hospital Course: Patient's hospital course was essentially unremarkable. She underwent urgent irrigation and debridement synovectomy polyethylene exchange and placement of antibiotic beads in her left knee. Cultures were obtained interoperatively. Patient was consulted with infectious disease and recommendations for discharge with vancomycin and Levaquin. Patient is comfortable stable to discharge  Consults: ID  Significant Diagnostic Studies: labs: Routine labs and cultures  Treatments: surgery: See operative note  Discharge Exam: Blood pressure 117/49, pulse 94, temperature 97.8 F (36.6 C), temperature source Oral, resp. rate 15, height 5' 3.5" (1.613 m), weight 55.339 kg (122 lb), SpO2 95 %. Incision/Wound: Dressing clean and dry  Disposition: 03-Skilled Nursing Facility  Discharge Instructions    Call MD / Call 911    Complete by:  As directed   If you experience chest pain or shortness of breath, CALL 911 and be transported to the hospital emergency room.  If you develope a fever above 101 F, pus (white drainage) or increased drainage or redness at the wound, or calf pain, call your surgeon's office.     Constipation Prevention    Complete by:  As directed   Drink plenty of fluids.  Prune juice may be helpful.  You may use a stool softener, such as Colace (over the counter) 100 mg twice a day.  Use MiraLax (over the counter) for constipation as needed.     Diet - low sodium heart healthy    Complete by:  As directed      Increase activity slowly as tolerated    Complete by:  As directed             Medication List    TAKE these medications        amLODipine 10 MG  tablet  Commonly known as:  NORVASC  TAKE 1 TABLET (10 MG TOTAL) BY MOUTH DAILY.     aspirin EC 325 MG tablet  Take 1 tablet (325 mg total) by mouth daily.     atorvastatin 40 MG tablet  Commonly known as:  LIPITOR  TAKE ONE TABLET BY MOUTH DAILY.     carvedilol 6.25 MG tablet  Commonly known as:  COREG  TAKE 1 TABLET (6.25 MG TOTAL) BY MOUTH 2 (TWO) TIMES DAILY WITH A MEAL. KEEP APPT FOR REFILLS     cloNIDine 0.1 MG tablet  Commonly known as:  CATAPRES  Take 1 tablet (0.1 mg total) by mouth 2 (two) times daily.     colchicine 0.6 MG tablet  Take 0.6-1.2 mg by mouth daily as needed. gout     diclofenac 75 MG EC tablet  Commonly known as:  VOLTAREN  Take 75 mg by mouth 2 (two) times daily.     Glucosamine-Chondroitin Caps  Take 1 capsule by mouth daily.     hydrochlorothiazide 12.5 MG capsule  Commonly known as:  MICROZIDE  TAKE 1 CAPSULE (12.5 MG TOTAL) BY MOUTH EVERY MORNING.     levofloxacin 750 MG tablet  Commonly known as:  LEVAQUIN  Take 1 tablet (750 mg total) by mouth every other day.     lisinopril 40 MG tablet  Commonly known as:  PRINIVIL,ZESTRIL  TAKE 1 TABLET (40 MG TOTAL) BY MOUTH DAILY.     multivitamin with  minerals Tabs tablet  Take 1 tablet by mouth daily.     oxyCODONE 5 MG immediate release tablet  Commonly known as:  Oxy IR/ROXICODONE  Take 1-2 tablets (5-10 mg total) by mouth every 4 (four) hours as needed for breakthrough pain.     traMADol 50 MG tablet  Commonly known as:  ULTRAM  Take 50 mg by mouth every 6 (six) hours as needed.     traMADol 50 MG tablet  Commonly known as:  ULTRAM  Take 1 tablet (50 mg total) by mouth every 6 (six) hours as needed for moderate pain.     vancomycin 500 mg in sodium chloride 0.9 % 100 mL  Inject 500 mg into the vein every 12 (twelve) hours.           Follow-up Information    Follow up with Jazzmyne Rasnick V, MD In 1 week.   Specialty:  Orthopedic Surgery   Contact information:   [place] [address] [phone]       Signed: Newt Minion 10/13/2015, 6:20 PM

## 2015-10-13 NOTE — Progress Notes (Signed)
Orthopedic Tech Progress Note Patient Details:  [name] May 27, 1941 [id number]  Ortho Devices Type of Ortho Device: Knee Immobilizer Ortho Device/Splint Interventions: Application   [name] 10/13/2015, 12:56 PM

## 2015-10-14 ENCOUNTER — Encounter: Payer: Self-pay | Admitting: *Deleted

## 2015-10-14 DIAGNOSIS — T8450XD Infection and inflammatory reaction due to unspecified internal joint prosthesis, subsequent encounter: Secondary | ICD-10-CM

## 2015-10-14 DIAGNOSIS — M0080 Arthritis due to other bacteria, unspecified joint: Secondary | ICD-10-CM

## 2015-10-14 LAB — C-REACTIVE PROTEIN: CRP: 24.2 mg/dL — ABNORMAL HIGH (ref <1.0)

## 2015-10-14 LAB — SEDIMENTATION RATE: Sed Rate: 140 mm/hr — ABNORMAL HIGH (ref 0–22)

## 2015-10-14 NOTE — Clinical Social Work Note (Addendum)
CSW received notification that patient will discharge today (return) to [facility] for STR.  Patient is to receive PICC line prior to discharge.  CSW made RN aware of this order and requested RN to follow-up with IV team in regards to placement needed for imminent discharge.  RN aware and agreeable to follow-up.    Patient will discharge possibly Saturday per MD. Patient will discharge to: [facility] (return) RN to call report prior to transportation to: [phone] Transportation: [facility]- to be scheduled after PICC line placed RN to call for transportation [phone]  CSW sent discharge summary to SNF for review.  Packet is complete.  RN, patient and family aware of discharge plans.  Nonnie Done, LCSW [phone]  5N1-9, 2S 15-16 and Psychiatric Service Line  Licensed Clinical Social Worker

## 2015-10-14 NOTE — Care Management Important Message (Signed)
Important Message  Patient Details  Name: ADDALIE CALLES MRN: [id number] Date of Birth: 10/15/1940   Medicare Important Message Given:  Yes    Jaydn Fincher P Nicholson Starace 10/14/2015, 3:01 PM

## 2015-10-14 NOTE — Progress Notes (Addendum)
    [facility]   Reason for visit: Follow up on PJI  Interval History: GPC on culture, being discharged now on vancomycin and levaquin  Physical Exam: Constitutional:  Filed Vitals:   10/14/15 0908 10/14/15 1300  BP: 137/53 118/47  Pulse:  78  Temp:  98.1 F (36.7 C)  Resp:  18   patient appears in NAD  Impression: Stable PJI  Plan: 1.  No changes, will consider antibiotic change once culture identified next week.  2. Routine HIV screening   We will arrange follow up as outpatient.

## 2015-10-14 NOTE — Progress Notes (Signed)
Subjective: 2 Days Post-Op Procedure(s) (LRB): Irrigation and Debridement , Poly Exchange, Antibiotic Beads Left Knee (Left) Patient reports pain as mild.  No complaints.  Objective: Vital signs in last 24 hours: Temp:  [98 F (36.7 C)-98.4 F (36.9 C)] 98.1 F (36.7 C) (03/17 1300) Pulse Rate:  [52-94] 78 (03/17 1300) Resp:  [16-18] 18 (03/17 1300) BP: (117-137)/(36-53) 118/47 mmHg (03/17 1300) SpO2:  [95 %-98 %] 98 % (03/17 1300)  Intake/Output from previous day: 03/16 0701 - 03/17 0700 In: 840 [P.O.:840] Out: -  Intake/Output this shift: Total I/O In: 480 [P.O.:480] Out: -    Recent Labs  10/12/15 1825  HGB 10.7*    Recent Labs  10/12/15 1825  WBC 22.6*  RBC 3.75*  HCT 33.5*  PLT 419*    Recent Labs  10/12/15 1825  NA 134*  K 4.3  CL 97*  CO2 21*  BUN 15  CREATININE 0.98  GLUCOSE 136*  CALCIUM 9.3   No results for input(s): LABPT, INR in the last 72 hours.  Sensation intact distally Intact pulses distally Incision: dressing C/D/I Compartment soft  Assessment/Plan: 2 Days Post-Op Procedure(s) (LRB): Irrigation and Debridement , Poly Exchange, Antibiotic Beads Left Knee (Left) Discharge to SNF tomorrow  Erskine Emery 10/14/2015, 3:52 PM

## 2015-10-14 NOTE — NC FL2 (Signed)
[place] LEVEL OF CARE SCREENING TOOL     IDENTIFICATION  Patient Name: [name] Birthdate: 1941/03/26 Sex: female Admission Date (Current Location): 10/12/2015  [facility] and [place] Number:  Herbalist and Address:  [facility], [employer] [address], [place], [place] [address]      Provider Number: [id number]  Attending Physician Name and Address:  Newt Minion, MD  Relative Name and Phone Number:       Current Level of Care: Hospital Recommended Level of Care: [place] Prior Approval Number:    Date Approved/Denied:   PASRR Number: [id number]  Discharge Plan: SNF    Current Diagnoses: Patient Active Problem List   Diagnosis Date Noted  . Septic arthritis of knee, left ([place]) 10/12/2015  . Total knee replacement status 09/28/2015  . Bruit 07/10/2012  . Leukocytosis 09/19/2011  . Encephalopathy 09/19/2011  . AAA 01/19/2009  . ILIAC ARTERY ANEURYSM 01/19/2009  . HYPERLIPIDEMIA-MIXED 01/14/2009  . TOBACCO ABUSE 01/14/2009  . Essential hypertension 01/14/2009  . Coronary atherosclerosis 01/14/2009  . Cerebrovascular disease 01/14/2009  . Peripheral vascular disease ([place]) 01/14/2009  . CHRONIC OBSTRUCTIVE PULMONARY DISEASE 01/14/2009  . CHEST PAIN-UNSPECIFIED 01/14/2009    Orientation RESPIRATION BLADDER Height & Weight     Self, Time, Situation, Place    Continent Weight: 122 lb (55.339 kg) Height:  5' 3.5" (161.3 cm)  BEHAVIORAL SYMPTOMS/MOOD NEUROLOGICAL BOWEL NUTRITION STATUS      Continent    AMBULATORY STATUS COMMUNICATION OF NEEDS Skin   Extensive Assist Verbally Surgical wounds, Wound Vac                       Personal Care Assistance Level of Assistance  Bathing, Dressing Bathing Assistance: Maximum assistance   Dressing Assistance: Maximum assistance     Functional Limitations Info             SPECIAL CARE FACTORS FREQUENCY  PT (By licensed PT), OT (By licensed OT)      PT Frequency: daily OT Frequency: daily            Contractures Contractures Info: Not present    Additional Factors Info                  Current Medications (10/14/2015):  This is the current hospital active medication list Current Facility-Administered Medications  Medication Dose Route Frequency Provider Last Rate Last Dose  . 0.9 %  sodium chloride infusion   Intravenous Continuous Newt Minion, MD 10 mL/hr at 10/13/15 [phone]    . acetaminophen (TYLENOL) tablet 650 mg  650 mg Oral Q6H PRN Newt Minion, MD       Or  . acetaminophen (TYLENOL) suppository 650 mg  650 mg Rectal Q6H PRN Newt Minion, MD      . amLODipine (NORVASC) tablet 10 mg  10 mg Oral Daily Newt Minion, MD   10 mg at 10/14/15 0908  . aspirin EC tablet 325 mg  325 mg Oral Q breakfast Newt Minion, MD   325 mg at 10/14/15 0854  . bisacodyl (DULCOLAX) suppository 10 mg  10 mg Rectal Daily PRN Meridee Score V, MD      . carvedilol (COREG) tablet 6.25 mg  6.25 mg Oral BID WC Newt Minion, MD   6.25 mg at 10/14/15 0855  . cloNIDine (CATAPRES) tablet 0.1 mg  0.1 mg Oral BID Newt Minion, MD   0.1  mg at 10/14/15 0905  . docusate sodium (COLACE) capsule 100 mg  100 mg Oral BID Newt Minion, MD   100 mg at 10/14/15 0905  . hydrochlorothiazide (MICROZIDE) capsule 12.5 mg  12.5 mg Oral Daily Newt Minion, MD   12.5 mg at 10/14/15 0905  . HYDROmorphone (DILAUDID) injection 1 mg  1 mg Intravenous Q2H PRN Newt Minion, MD      . levofloxacin [facility]) tablet 750 mg  750 mg Oral Q48H Thayer Headings, MD   750 mg at 10/13/15 1759  . lisinopril (PRINIVIL,ZESTRIL) tablet 40 mg  40 mg Oral Daily Newt Minion, MD   40 mg at 10/14/15 0907  . menthol-cetylpyridinium (CEPACOL) lozenge 3 mg  1 lozenge Oral PRN Newt Minion, MD       Or  . phenol (CHLORASEPTIC) mouth spray 1 spray  1 spray Mouth/Throat PRN Newt Minion, MD      . methocarbamol (ROBAXIN) tablet 500 mg  500 mg Oral Q6H PRN Newt Minion, MD   500 mg at  10/13/15 1314   Or  . methocarbamol (ROBAXIN) 500 mg in dextrose 5 % 50 mL IVPB  500 mg Intravenous Q6H PRN Newt Minion, MD      . metoCLOPramide (REGLAN) tablet 5-10 mg  5-10 mg Oral Q8H PRN Newt Minion, MD       Or  . metoCLOPramide (REGLAN) injection 5-10 mg  5-10 mg Intravenous Q8H PRN Newt Minion, MD      . ondansetron [facility]) tablet 4 mg  4 mg Oral Q6H PRN Newt Minion, MD       Or  . ondansetron [facility] injection 4 mg  4 mg Intravenous Q6H PRN Meridee Score V, MD      . oxyCODONE (Oxy IR/ROXICODONE) immediate release tablet 5-10 mg  5-10 mg Oral Q3H PRN Newt Minion, MD   10 mg at 10/13/15 0659  . polyethylene glycol (MIRALAX / GLYCOLAX) packet 17 g  17 g Oral Daily PRN Newt Minion, MD      . traMADol [facility]) tablet 50 mg  50 mg Oral Q6H PRN Newt Minion, MD      . vancomycin (VANCOCIN) 500 mg in sodium chloride 0.9 % 100 mL IVPB  500 mg Intravenous Q12H Skeet Simmer, RPH   500 mg at 10/14/15 3888     Discharge Medications: Please see discharge summary for a list of discharge medications.  Relevant Imaging Results:  Relevant Lab Results:   Additional Information SSN: [id number]  Dulcy Fanny, LCSW

## 2015-10-14 NOTE — Anesthesia Postprocedure Evaluation (Signed)
Anesthesia Post Note  Patient: [name]  Procedure(s) Performed: Procedure(s) (LRB): Irrigation and Debridement , Poly Exchange, Antibiotic Beads Left Knee (Left)  Patient location during evaluation: PACU Anesthesia Type: General Level of consciousness: patient cooperative, awake and confused Pain management: pain level controlled Vital Signs Assessment: post-procedure vital signs reviewed and stable Respiratory status: spontaneous breathing and respiratory function stable Cardiovascular status: stable Postop Assessment: no signs of nausea or vomiting Anesthetic complications: no    Last Vitals:  Filed Vitals:   10/13/15 2127 10/14/15 0611  BP: 128/36 137/53  Pulse: 52 63  Temp: 36.7 C 36.9 C  Resp: 16 16    Last Pain:  Filed Vitals:   10/14/15 0612  PainSc: 0-No pain                 Paidyn Mcferran

## 2015-10-15 DIAGNOSIS — E785 Hyperlipidemia, unspecified: Secondary | ICD-10-CM | POA: Diagnosis present

## 2015-10-15 DIAGNOSIS — Z96652 Presence of left artificial knee joint: Secondary | ICD-10-CM | POA: Diagnosis present

## 2015-10-15 DIAGNOSIS — F17210 Nicotine dependence, cigarettes, uncomplicated: Secondary | ICD-10-CM | POA: Diagnosis present

## 2015-10-15 DIAGNOSIS — T8131XA Disruption of external operation (surgical) wound, not elsewhere classified, initial encounter: Secondary | ICD-10-CM | POA: Diagnosis not present

## 2015-10-15 DIAGNOSIS — Z79899 Other long term (current) drug therapy: Secondary | ICD-10-CM | POA: Diagnosis not present

## 2015-10-15 DIAGNOSIS — R6520 Severe sepsis without septic shock: Secondary | ICD-10-CM | POA: Diagnosis not present

## 2015-10-15 DIAGNOSIS — Z7982 Long term (current) use of aspirin: Secondary | ICD-10-CM | POA: Diagnosis not present

## 2015-10-15 DIAGNOSIS — I723 Aneurysm of iliac artery: Secondary | ICD-10-CM | POA: Diagnosis not present

## 2015-10-15 DIAGNOSIS — B9562 Methicillin resistant Staphylococcus aureus infection as the cause of diseases classified elsewhere: Secondary | ICD-10-CM | POA: Diagnosis not present

## 2015-10-15 DIAGNOSIS — L03116 Cellulitis of left lower limb: Secondary | ICD-10-CM | POA: Diagnosis not present

## 2015-10-15 DIAGNOSIS — Z96641 Presence of right artificial hip joint: Secondary | ICD-10-CM | POA: Diagnosis present

## 2015-10-15 DIAGNOSIS — Z86718 Personal history of other venous thrombosis and embolism: Secondary | ICD-10-CM | POA: Diagnosis not present

## 2015-10-15 DIAGNOSIS — I2510 Atherosclerotic heart disease of native coronary artery without angina pectoris: Secondary | ICD-10-CM | POA: Diagnosis present

## 2015-10-15 DIAGNOSIS — M1990 Unspecified osteoarthritis, unspecified site: Secondary | ICD-10-CM | POA: Diagnosis not present

## 2015-10-15 DIAGNOSIS — T8454XA Infection and inflammatory reaction due to internal left knee prosthesis, initial encounter: Secondary | ICD-10-CM | POA: Diagnosis not present

## 2015-10-15 DIAGNOSIS — T8131XD Disruption of external operation (surgical) wound, not elsewhere classified, subsequent encounter: Secondary | ICD-10-CM | POA: Diagnosis not present

## 2015-10-15 DIAGNOSIS — L02416 Cutaneous abscess of left lower limb: Secondary | ICD-10-CM | POA: Diagnosis not present

## 2015-10-15 DIAGNOSIS — R0989 Other specified symptoms and signs involving the circulatory and respiratory systems: Secondary | ICD-10-CM | POA: Diagnosis not present

## 2015-10-15 DIAGNOSIS — T8454XD Infection and inflammatory reaction due to internal left knee prosthesis, subsequent encounter: Secondary | ICD-10-CM | POA: Diagnosis not present

## 2015-10-15 DIAGNOSIS — Z8673 Personal history of transient ischemic attack (TIA), and cerebral infarction without residual deficits: Secondary | ICD-10-CM | POA: Diagnosis not present

## 2015-10-15 DIAGNOSIS — J449 Chronic obstructive pulmonary disease, unspecified: Secondary | ICD-10-CM | POA: Diagnosis present

## 2015-10-15 DIAGNOSIS — I739 Peripheral vascular disease, unspecified: Secondary | ICD-10-CM | POA: Diagnosis present

## 2015-10-15 DIAGNOSIS — Z471 Aftercare following joint replacement surgery: Secondary | ICD-10-CM | POA: Diagnosis not present

## 2015-10-15 DIAGNOSIS — I714 Abdominal aortic aneurysm, without rupture: Secondary | ICD-10-CM | POA: Diagnosis not present

## 2015-10-15 DIAGNOSIS — T8454XS Infection and inflammatory reaction due to internal left knee prosthesis, sequela: Secondary | ICD-10-CM | POA: Diagnosis not present

## 2015-10-15 DIAGNOSIS — T8132XA Disruption of internal operation (surgical) wound, not elsewhere classified, initial encounter: Secondary | ICD-10-CM | POA: Diagnosis not present

## 2015-10-15 DIAGNOSIS — M00862 Arthritis due to other bacteria, left knee: Secondary | ICD-10-CM | POA: Diagnosis not present

## 2015-10-15 DIAGNOSIS — Z87891 Personal history of nicotine dependence: Secondary | ICD-10-CM | POA: Diagnosis not present

## 2015-10-15 DIAGNOSIS — I10 Essential (primary) hypertension: Secondary | ICD-10-CM | POA: Diagnosis present

## 2015-10-15 DIAGNOSIS — M009 Pyogenic arthritis, unspecified: Secondary | ICD-10-CM | POA: Diagnosis not present

## 2015-10-15 DIAGNOSIS — Y838 Other surgical procedures as the cause of abnormal reaction of the patient, or of later complication, without mention of misadventure at the time of the procedure: Secondary | ICD-10-CM | POA: Diagnosis present

## 2015-10-15 DIAGNOSIS — M25562 Pain in left knee: Secondary | ICD-10-CM | POA: Diagnosis not present

## 2015-10-15 LAB — WOUND CULTURE

## 2015-10-15 LAB — BASIC METABOLIC PANEL
ANION GAP: 11 (ref 5–15)
BUN: 7 mg/dL (ref 6–20)
CHLORIDE: 99 mmol/L — AB (ref 101–111)
CO2: 27 mmol/L (ref 22–32)
Calcium: 9.5 mg/dL (ref 8.9–10.3)
Creatinine, Ser: 0.72 mg/dL (ref 0.44–1.00)
GFR calc Af Amer: >60 mL/min (ref >60)
GFR calc non Af Amer: >60 mL/min (ref >60)
GLUCOSE: 98 mg/dL (ref 65–99)
POTASSIUM: 2.8 mmol/L — AB (ref 3.5–5.1)
Sodium: 137 mmol/L (ref 135–145)

## 2015-10-15 LAB — TISSUE CULTURE

## 2015-10-15 LAB — HIV ANTIBODY (ROUTINE TESTING W REFLEX): HIV Screen 4th Generation wRfx: NONREACTIVE

## 2015-10-15 MED ORDER — SODIUM CHLORIDE 0.9% FLUSH
10.0000 mL | INTRAVENOUS | Status: DC | PRN
  Administered 2015-10-15: 10 mL
  Filled 2015-10-15: qty 40

## 2015-10-15 MED ORDER — HEPARIN SOD (PORK) LOCK FLUSH 100 UNIT/ML IV SOLN
250.0000 [IU] | INTRAVENOUS | Status: AC | PRN
Start: 2015-10-15 — End: 2015-10-15
  Administered 2015-10-15: 250 [IU]

## 2015-10-15 MED ORDER — SODIUM CHLORIDE 0.9% FLUSH
10.0000 mL | Freq: Two times a day (BID) | INTRAVENOUS | Status: DC
  Administered 2015-10-15: 10 mL

## 2015-10-15 NOTE — Progress Notes (Signed)
Pt discharge to [place] at [place] here to transport pt to disposition. Pt IV removed; leg dsg remains clean, dry and intact with no active bleeding noted. Pt transported off unit via stretcher with belongings to the side. PDarden Palmer Nell Gales Rn

## 2015-10-15 NOTE — Progress Notes (Signed)
Pt discharge education and instructions completed. Report called off to RN Gerald Stabs at [facility]. Pt awaiting on [facility] to transport to disposition. Will continue to monitor pt till picked up. P. Angelica Pou RN

## 2015-10-15 NOTE — Progress Notes (Signed)
Peripherally Inserted Central Catheter/Midline Placement  The IV Nurse has discussed with the patient and/or persons authorized to consent for the patient, the purpose of this procedure and the potential benefits and risks involved with this procedure.  The benefits include less needle sticks, lab draws from the catheter and patient may be discharged home with the catheter.  Risks include, but not limited to, infection, bleeding, blood clot (thrombus formation), and puncture of an artery; nerve damage and irregular heat beat.  Alternatives to this procedure were also discussed. Pt. A&O x 3 today and signed consent.  Son [name] also call via phone and he also consent to have PICC. PICC/Midline Placement Documentation  PICC Single Lumen 10/15/15 PICC Right Brachial 34 cm 0 cm (Active)  Indication for Insertion or Continuance of Line Home intravenous therapies (PICC only) 10/15/2015 11:02 AM  Exposed Catheter (cm) 0 cm 10/15/2015 11:02 AM  Site Assessment Clean;Dry;Intact 10/15/2015 11:02 AM  Line Status Flushed;Saline locked;Blood return noted 10/15/2015 11:02 AM  Dressing Type Transparent 10/15/2015 11:02 AM  Dressing Status Clean;Dry;Intact 10/15/2015 11:02 AM  Dressing Change Due 10/22/15 10/15/2015 11:02 AM       Gordan Payment 10/15/2015, 11:03 AM

## 2015-10-15 NOTE — Discharge Summary (Signed)
Physician Discharge Summary  Patient ID: [name] MRN: [id number] DOB/AGE: January 23, 1941 75 y.o.  Admit date: 10/12/2015 Discharge date: 10/15/2015  Admission Diagnoses:  Active Problems:   Septic arthritis of knee, left [facility])   Discharge Diagnoses:  Same  Surgeries: Procedure(s): Irrigation and Debridement , Poly Exchange, Antibiotic Beads Left Knee on 10/12/2015 - 10/13/2015   Consultants:    Discharged Condition: Stable  Hospital Course: [name] is an 75 y.o. female who was admitted 10/12/2015 with a chief complaint of left knee pain, and found to have a diagnosis of septic arthritis left knee.  They were brought to the operating room on 10/12/2015 - 10/13/2015 and underwent the above named procedures.   Progressed well with PT and dced to snf wbat left knee on pod 3 Antibiotics given:  Anti-infectives    Start     Dose/Rate Route Frequency Ordered Stop   10/13/15 1200  levofloxacin (LEVAQUIN) tablet 750 mg     750 mg Oral Every 48 hours 10/13/15 1101     10/13/15 1200  vancomycin (VANCOCIN) 500 mg in sodium chloride 0.9 % 100 mL IVPB     500 mg 100 mL/hr over 60 Minutes Intravenous Every 12 hours 10/13/15 1132     10/13/15 0400  ceFAZolin (ANCEF) IVPB 1 g/50 mL premix     1 g 100 mL/hr over 30 Minutes Intravenous Every 6 hours 10/12/15 2356 10/13/15 0845   10/13/15 0000  levofloxacin (LEVAQUIN) 750 MG tablet     750 mg Oral Every 48 hours 10/13/15 1819     10/13/15 0000  vancomycin 500 mg in sodium chloride 0.9 % 100 mL    Comments:  Pharmacy to dose and monitor   500 mg 100 mL/hr over 60 Minutes Intravenous Every 12 hours 10/13/15 1819     10/12/15 2237  vancomycin (VANCOCIN) powder  Status:  Discontinued       As needed 10/12/15 2238 10/12/15 2307   10/12/15 2236  gentamicin (GARAMYCIN) injection  Status:  Discontinued       As needed 10/12/15 2237 10/12/15 2307   10/12/15 2215  vancomycin (VANCOCIN) powder 250 mg  Status:  Discontinued     250 mg Other To  Surgery 10/12/15 2206 10/12/15 2356   10/12/15 2215  gentamicin (GARAMYCIN) injection 240 mg  Status:  Discontinued     240 mg Intramuscular To Surgery 10/12/15 2206 10/12/15 2356    .  Recent vital signs:  Filed Vitals:   10/15/15 0437 10/15/15 0840  BP: 156/61 131/59  Pulse: 91 91  Temp: 98.7 F (37.1 C)   Resp: 18 18    Recent laboratory studies:  Results for orders placed or performed during the hospital encounter of 10/12/15  Anaerobic culture  Result Value Ref Range   Specimen Description WOUND LEFT KNEE    Special Requests NONE    Gram Stain      ABUNDANT WBC PRESENT, PREDOMINANTLY PMN NO SQUAMOUS EPITHELIAL CELLS SEEN FEW GRAM POSITIVE COCCI IN PAIRS Performed at [facility]    Culture PENDING    Report Status PENDING   Anaerobic culture  Result Value Ref Range   Specimen Description WOUND    Special Requests DEEP WOUND LEFT KNEE    Gram Stain      ABUNDANT WBC PRESENT,BOTH PMN AND MONONUCLEAR NO SQUAMOUS EPITHELIAL CELLS SEEN FEW GRAM POSITIVE COCCI IN PAIRS Performed at [facility]    Culture PENDING    Report Status PENDING   Wound  culture  Result Value Ref Range   Specimen Description WOUND LEFT KNEE    Special Requests NONE    Gram Stain      ABUNDANT WBC PRESENT, PREDOMINANTLY PMN NO SQUAMOUS EPITHELIAL CELLS SEEN FEW GRAM POSITIVE COCCI IN PAIRS Performed at [facility]    Culture      ABUNDANT STAPHYLOCOCCUS AUREUS Note: RIFAMPIN AND GENTAMICIN SHOULD NOT BE USED AS SINGLE DRUGS FOR TREATMENT OF STAPH INFECTIONS. Performed at [facility]    Report Status PENDING   Wound culture  Result Value Ref Range   Specimen Description WOUND    Special Requests DEEP WOUND LEFT KNEE    Gram Stain      ABUNDANT WBC PRESENT,BOTH PMN AND MONONUCLEAR NO SQUAMOUS EPITHELIAL CELLS SEEN FEW GRAM POSITIVE COCCI IN PAIRS Performed at [facility]    Culture      MODERATE STAPHYLOCOCCUS AUREUS Note:  RIFAMPIN AND GENTAMICIN SHOULD NOT BE USED AS SINGLE DRUGS FOR TREATMENT OF STAPH INFECTIONS. Performed at [facility]    Report Status PENDING   Anaerobic culture  Result Value Ref Range   Specimen Description KNEE    Special Requests INFECTED LEFT KNEE    Gram Stain      FEW WBC PRESENT, PREDOMINANTLY PMN NO SQUAMOUS EPITHELIAL CELLS SEEN NO ORGANISMS SEEN Performed at [facility]    Culture PENDING    Report Status PENDING   Tissue culture  Result Value Ref Range   Specimen Description KNEE    Special Requests INFECTED LEFT KNEE    Gram Stain      FEW WBC PRESENT, PREDOMINANTLY PMN NO ORGANISMS SEEN Performed at [facility]    Culture      MODERATE STAPHYLOCOCCUS AUREUS Note: RIFAMPIN AND GENTAMICIN SHOULD NOT BE USED AS SINGLE DRUGS FOR TREATMENT OF STAPH INFECTIONS. Performed at [facility]    Report Status PENDING   CBC  Result Value Ref Range   WBC 22.6 (H) 4.0 - 10.5 K/uL   RBC 3.75 (L) 3.87 - 5.11 MIL/uL   Hemoglobin 10.7 (L) 12.0 - 15.0 g/dL   HCT 33.5 (L) 36.0 - 46.0 %   MCV 89.3 78.0 - 100.0 fL   MCH 28.5 26.0 - 34.0 pg   MCHC 31.9 30.0 - 36.0 g/dL   RDW 14.7 11.5 - 15.5 %   Platelets 419 (H) 150 - 400 K/uL  Basic metabolic panel  Result Value Ref Range   Sodium 134 (L) 135 - 145 mmol/L   Potassium 4.3 3.5 - 5.1 mmol/L   Chloride 97 (L) 101 - 111 mmol/L   CO2 21 (L) 22 - 32 mmol/L   Glucose, Bld 136 (H) 65 - 99 mg/dL   BUN 15 6 - 20 mg/dL   Creatinine, Ser 0.98 0.44 - 1.00 mg/dL   Calcium 9.3 8.9 - 10.3 mg/dL   GFR calc non Af Amer 55 (L) >60 mL/min   GFR calc Af Amer >60 >60 mL/min   Anion gap 16 (H) 5 - 15  C-reactive protein  Result Value Ref Range   CRP 24.2 (H) <1.0 mg/dL  Sedimentation rate  Result Value Ref Range   Sed Rate 140 (H) 0 - 22 mm/hr  HIV antibody  Result Value Ref Range   HIV Screen 4th Generation wRfx Non Reactive Non Reactive  Basic metabolic panel  Result Value Ref Range   Sodium  137 135 - 145 mmol/L   Potassium 2.8 (L) 3.5 - 5.1  mmol/L   Chloride 99 (L) 101 - 111 mmol/L   CO2 27 22 - 32 mmol/L   Glucose, Bld 98 65 - 99 mg/dL   BUN 7 6 - 20 mg/dL   Creatinine, Ser 0.72 0.44 - 1.00 mg/dL   Calcium 9.5 8.9 - 10.3 mg/dL   GFR calc non Af Amer >60 >60 mL/min   GFR calc Af Amer >60 >60 mL/min   Anion gap 11 5 - 15    Discharge Medications:     Medication List    TAKE these medications        amLODipine 10 MG tablet  Commonly known as:  NORVASC  TAKE 1 TABLET (10 MG TOTAL) BY MOUTH DAILY.     aspirin EC 325 MG tablet  Take 1 tablet (325 mg total) by mouth daily.     atorvastatin 40 MG tablet  Commonly known as:  LIPITOR  TAKE ONE TABLET BY MOUTH DAILY.     carvedilol 6.25 MG tablet  Commonly known as:  COREG  TAKE 1 TABLET (6.25 MG TOTAL) BY MOUTH 2 (TWO) TIMES DAILY WITH A MEAL. KEEP APPT FOR REFILLS     cloNIDine 0.1 MG tablet  Commonly known as:  CATAPRES  Take 1 tablet (0.1 mg total) by mouth 2 (two) times daily.     colchicine 0.6 MG tablet  Take 0.6-1.2 mg by mouth daily as needed. gout     diclofenac 75 MG EC tablet  Commonly known as:  VOLTAREN  Take 75 mg by mouth 2 (two) times daily.     Glucosamine-Chondroitin Caps  Take 1 capsule by mouth daily.     hydrochlorothiazide 12.5 MG capsule  Commonly known as:  MICROZIDE  TAKE 1 CAPSULE (12.5 MG TOTAL) BY MOUTH EVERY MORNING.     levofloxacin 750 MG tablet  Commonly known as:  LEVAQUIN  Take 1 tablet (750 mg total) by mouth every other day.     lisinopril 40 MG tablet  Commonly known as:  PRINIVIL,ZESTRIL  TAKE 1 TABLET (40 MG TOTAL) BY MOUTH DAILY.     multivitamin with minerals Tabs tablet  Take 1 tablet by mouth daily.     oxyCODONE 5 MG immediate release tablet  Commonly known as:  Oxy IR/ROXICODONE  Take 1-2 tablets (5-10 mg total) by mouth every 4 (four) hours as needed for breakthrough pain.     traMADol 50 MG tablet  Commonly known as:  ULTRAM  Take 50 mg by mouth  every 6 (six) hours as needed.     traMADol 50 MG tablet  Commonly known as:  ULTRAM  Take 1 tablet (50 mg total) by mouth every 6 (six) hours as needed for moderate pain.     vancomycin 500 mg in sodium chloride 0.9 % 100 mL  Inject 500 mg into the vein every 12 (twelve) hours.        Diagnostic Studies: Dg Chest 2 View  09/20/2015  CLINICAL DATA:  Preop for knee replacement surgery EXAM: CHEST  2 VIEW COMPARISON:  09/19/2011 FINDINGS: Cardiomediastinal silhouette is stable. No acute infiltrate or pleural effusion. No pulmonary edema. Thoracic spine osteopenia. Minimal degenerative changes lower thoracic spine. Mild hyperinflation. IMPRESSION: No active cardiopulmonary disease. Mild hyperinflation. Thoracic spine osteopenia. Electronically Signed   By: Lahoma Crocker M.D.   On: 09/20/2015 16:37    Disposition: 03-Skilled Nursing Facility      Discharge Instructions    Call MD / Call 911    Complete by:  As  directed   If you experience chest pain or shortness of breath, CALL 911 and be transported to the hospital emergency room.  If you develope a fever above 101 F, pus (white drainage) or increased drainage or redness at the wound, or calf pain, call your surgeon's office.     Call MD / Call 911    Complete by:  As directed   If you experience chest pain or shortness of breath, CALL 911 and be transported to the hospital emergency room.  If you develope a fever above 101 F, pus (white drainage) or increased drainage or redness at the wound, or calf pain, call your surgeon's office.     Constipation Prevention    Complete by:  As directed   Drink plenty of fluids.  Prune juice may be helpful.  You may use a stool softener, such as Colace (over the counter) 100 mg twice a day.  Use MiraLax (over the counter) for constipation as needed.     Constipation Prevention    Complete by:  As directed   Drink plenty of fluids.  Prune juice may be helpful.  You may use a stool softener, such as Colace  (over the counter) 100 mg twice a day.  Use MiraLax (over the counter) for constipation as needed.     Diet - low sodium heart healthy    Complete by:  As directed      Diet - low sodium heart healthy    Complete by:  As directed      Increase activity slowly as tolerated    Complete by:  As directed      Increase activity slowly as tolerated    Complete by:  As directed            Follow-up Information    Follow up with DUDA,MARCUS V, MD In 1 week.   Specialty:  Orthopedic Surgery   Contact information:   [place] [address] [phone]        Signed: Meredith Pel 10/15/2015, 9:58 AM

## 2015-10-15 NOTE — Progress Notes (Signed)
Patient is set to discharge to [facility] at [facility] today. Patient's son - Wilfred Lacy & daughter-in-law, Lelon Frohlich (ph#: [phone]) aware. Discharge packet on chart - RN, Lannette Donath aware. RN to call for transport (ph#: [phone]) when ready.     Raynaldo Opitz, [facility] Clinical Social Worker cell #: [phone]

## 2015-10-17 ENCOUNTER — Encounter (HOSPITAL_COMMUNITY): Payer: Self-pay | Admitting: *Deleted

## 2015-10-17 ENCOUNTER — Telehealth: Payer: Self-pay | Admitting: *Deleted

## 2015-10-17 ENCOUNTER — Other Ambulatory Visit: Payer: Self-pay | Admitting: Orthopedic Surgery

## 2015-10-17 DIAGNOSIS — Z471 Aftercare following joint replacement surgery: Secondary | ICD-10-CM | POA: Diagnosis not present

## 2015-10-17 DIAGNOSIS — M25562 Pain in left knee: Secondary | ICD-10-CM | POA: Diagnosis not present

## 2015-10-17 DIAGNOSIS — B9562 Methicillin resistant Staphylococcus aureus infection as the cause of diseases classified elsewhere: Secondary | ICD-10-CM | POA: Diagnosis not present

## 2015-10-17 NOTE — Telephone Encounter (Signed)
Per Dr. Linus Salmons ,RN relayed directions to [place], nursing at [facility] to d/c levaquin.  Please advise on stop date for the vancomycin (SNF needs it for their records). Landis Gandy, RN

## 2015-10-17 NOTE — Progress Notes (Signed)
Had left message at [facility] for pt's nurse to call back for pre-op call. Jake Michaelis, LPN returned the call. She verified allergies, medications (not taking Oxycodone, but is taking Hydrocodone), medical and surgical history. She states pt is alert, oriented and can speak for herself. Pre-op instructions given to [facility]. She voiced understanding.

## 2015-10-17 NOTE — Telephone Encounter (Signed)
-----   Message from Thayer Headings, MD sent at 10/17/2015  9:58 AM EDT ----- Can you do me a tremendous favor?  Can you call [facility] at [facility] SN[facility] about this patient and have the patient stop the oral levaquin.  It is MRSA and she just needs the vancomycin.  She will be following up with [place].  Thanks!  Rob

## 2015-10-18 ENCOUNTER — Encounter (HOSPITAL_COMMUNITY): Payer: Self-pay | Admitting: Orthopedic Surgery

## 2015-10-18 ENCOUNTER — Inpatient Hospital Stay (HOSPITAL_COMMUNITY)
Admission: RE | Admit: 2015-10-18 | Discharge: 2015-10-20 | DRG: 941 | Disposition: A | Discharge status: Skilled Nursing Facility | Payer: Medicare Other | Source: Ambulatory Visit | Attending: Orthopedic Surgery | Admitting: Orthopedic Surgery

## 2015-10-18 ENCOUNTER — Encounter (HOSPITAL_COMMUNITY)
Admission: RE | Disposition: A | Discharge status: Skilled Nursing Facility | Payer: Self-pay | Source: Ambulatory Visit | Attending: Orthopedic Surgery

## 2015-10-18 ENCOUNTER — Inpatient Hospital Stay (HOSPITAL_COMMUNITY): Payer: Medicare Other | Admitting: Certified Registered Nurse Anesthetist

## 2015-10-18 ENCOUNTER — Other Ambulatory Visit: Payer: Self-pay | Admitting: Orthopedic Surgery

## 2015-10-18 DIAGNOSIS — I10 Essential (primary) hypertension: Secondary | ICD-10-CM | POA: Diagnosis present

## 2015-10-18 DIAGNOSIS — Z86718 Personal history of other venous thrombosis and embolism: Secondary | ICD-10-CM

## 2015-10-18 DIAGNOSIS — T8454XA Infection and inflammatory reaction due to internal left knee prosthesis, initial encounter: Secondary | ICD-10-CM | POA: Diagnosis not present

## 2015-10-18 DIAGNOSIS — Z96641 Presence of right artificial hip joint: Secondary | ICD-10-CM | POA: Diagnosis present

## 2015-10-18 DIAGNOSIS — E785 Hyperlipidemia, unspecified: Secondary | ICD-10-CM | POA: Diagnosis present

## 2015-10-18 DIAGNOSIS — F17210 Nicotine dependence, cigarettes, uncomplicated: Secondary | ICD-10-CM | POA: Diagnosis present

## 2015-10-18 DIAGNOSIS — T8454XS Infection and inflammatory reaction due to internal left knee prosthesis, sequela: Secondary | ICD-10-CM | POA: Diagnosis not present

## 2015-10-18 DIAGNOSIS — Z7982 Long term (current) use of aspirin: Secondary | ICD-10-CM

## 2015-10-18 DIAGNOSIS — T8131XD Disruption of external operation (surgical) wound, not elsewhere classified, subsequent encounter: Secondary | ICD-10-CM

## 2015-10-18 DIAGNOSIS — M00862 Arthritis due to other bacteria, left knee: Secondary | ICD-10-CM | POA: Diagnosis not present

## 2015-10-18 DIAGNOSIS — J449 Chronic obstructive pulmonary disease, unspecified: Secondary | ICD-10-CM | POA: Diagnosis present

## 2015-10-18 DIAGNOSIS — Z79899 Other long term (current) drug therapy: Secondary | ICD-10-CM

## 2015-10-18 DIAGNOSIS — Y838 Other surgical procedures as the cause of abnormal reaction of the patient, or of later complication, without mention of misadventure at the time of the procedure: Secondary | ICD-10-CM | POA: Diagnosis present

## 2015-10-18 DIAGNOSIS — I2510 Atherosclerotic heart disease of native coronary artery without angina pectoris: Secondary | ICD-10-CM | POA: Diagnosis present

## 2015-10-18 DIAGNOSIS — T8131XA Disruption of external operation (surgical) wound, not elsewhere classified, initial encounter: Secondary | ICD-10-CM | POA: Diagnosis present

## 2015-10-18 DIAGNOSIS — I739 Peripheral vascular disease, unspecified: Secondary | ICD-10-CM | POA: Diagnosis present

## 2015-10-18 DIAGNOSIS — Z96652 Presence of left artificial knee joint: Secondary | ICD-10-CM | POA: Diagnosis present

## 2015-10-18 HISTORY — DX: Carrier or suspected carrier of methicillin resistant Staphylococcus aureus: Z22.322

## 2015-10-18 HISTORY — PX: I&D EXTREMITY: SHX5045

## 2015-10-18 LAB — ANAEROBIC CULTURE

## 2015-10-18 LAB — CBC
HEMATOCRIT: 28.4 % — AB (ref 36.0–46.0)
Hemoglobin: 9.4 g/dL — ABNORMAL LOW (ref 12.0–15.0)
MCH: 29.7 pg (ref 26.0–34.0)
MCHC: 33.1 g/dL (ref 30.0–36.0)
MCV: 89.6 fL (ref 78.0–100.0)
Platelets: 396 10*3/uL (ref 150–400)
RBC: 3.17 MIL/uL — ABNORMAL LOW (ref 3.87–5.11)
RDW: 15.1 % (ref 11.5–15.5)
WBC: 12.9 10*3/uL — ABNORMAL HIGH (ref 4.0–10.5)

## 2015-10-18 LAB — BASIC METABOLIC PANEL
Anion gap: 14 (ref 5–15)
BUN: 11 mg/dL (ref 6–20)
CALCIUM: 9.3 mg/dL (ref 8.9–10.3)
CO2: 24 mmol/L (ref 22–32)
CREATININE: 0.72 mg/dL (ref 0.44–1.00)
Chloride: 101 mmol/L (ref 101–111)
GFR calc Af Amer: >60 mL/min (ref >60)
GFR calc non Af Amer: >60 mL/min (ref >60)
GLUCOSE: 106 mg/dL — AB (ref 65–99)
Potassium: 4 mmol/L (ref 3.5–5.1)
Sodium: 139 mmol/L (ref 135–145)

## 2015-10-18 SURGERY — IRRIGATION AND DEBRIDEMENT EXTREMITY
Anesthesia: Monitor Anesthesia Care | Site: Knee | Laterality: Left

## 2015-10-18 MED ORDER — CARVEDILOL 6.25 MG PO TABS
6.2500 mg | ORAL_TABLET | Freq: Two times a day (BID) | ORAL | Status: DC
  Administered 2015-10-18 – 2015-10-20 (×4): 6.25 mg via ORAL
  Filled 2015-10-18 (×4): qty 1

## 2015-10-18 MED ORDER — ACETAMINOPHEN 325 MG PO TABS: 650.0000 mg | ORAL_TABLET | Freq: Four times a day (QID) | ORAL | Status: DC | PRN

## 2015-10-18 MED ORDER — POTASSIUM CHLORIDE CRYS ER 10 MEQ PO TBCR
10.0000 meq | EXTENDED_RELEASE_TABLET | Freq: Every day | ORAL | Status: DC
Start: 2015-10-19 — End: 2015-10-20
  Administered 2015-10-19 – 2015-10-20 (×2): 10 meq via ORAL
  Filled 2015-10-18 (×2): qty 1

## 2015-10-18 MED ORDER — HYDROCODONE-ACETAMINOPHEN 5-325 MG PO TABS
1.0000 | ORAL_TABLET | Freq: Two times a day (BID) | ORAL | Status: DC
  Administered 2015-10-18 – 2015-10-20 (×4): 1 via ORAL
  Filled 2015-10-18 (×4): qty 1

## 2015-10-18 MED ORDER — LEVOFLOXACIN 500 MG PO TABS: 750.0000 mg | ORAL_TABLET | ORAL | Status: DC

## 2015-10-18 MED ORDER — METHOCARBAMOL 1000 MG/10ML IJ SOLN
500.0000 mg | Freq: Four times a day (QID) | INTRAVENOUS | Status: DC | PRN
  Filled 2015-10-18: qty 5

## 2015-10-18 MED ORDER — ROPIVACAINE HCL 5 MG/ML IJ SOLN
INTRAMUSCULAR | Status: DC | PRN
  Administered 2015-10-18: 25 mL via PERINEURAL

## 2015-10-18 MED ORDER — SODIUM CHLORIDE 0.9 % IV SOLN
INTRAVENOUS | Status: DC
  Administered 2015-10-19: 10:00:00 via INTRAVENOUS

## 2015-10-18 MED ORDER — ONDANSETRON HCL 4 MG PO TABS: 4.0000 mg | ORAL_TABLET | Freq: Four times a day (QID) | ORAL | Status: DC | PRN

## 2015-10-18 MED ORDER — ADULT MULTIVITAMIN W/MINERALS CH
1.0000 | ORAL_TABLET | Freq: Every day | ORAL | Status: DC
  Administered 2015-10-18 – 2015-10-20 (×3): 1 via ORAL
  Filled 2015-10-18 (×3): qty 1

## 2015-10-18 MED ORDER — VANCOMYCIN HCL 500 MG IV SOLR
500.0000 mg | Freq: Two times a day (BID) | INTRAVENOUS | Status: DC
  Administered 2015-10-18 – 2015-10-20 (×4): 500 mg via INTRAVENOUS
  Filled 2015-10-18 (×5): qty 500

## 2015-10-18 MED ORDER — HYDROCODONE-ACETAMINOPHEN 5-325 MG PO TABS
1.0000 | ORAL_TABLET | ORAL | Status: DC | PRN
  Filled 2015-10-18: qty 1

## 2015-10-18 MED ORDER — SODIUM CHLORIDE 0.9 % IR SOLN
Status: DC | PRN
  Administered 2015-10-18: 3000 mL

## 2015-10-18 MED ORDER — ASPIRIN EC 325 MG PO TBEC
325.0000 mg | DELAYED_RELEASE_TABLET | Freq: Every day | ORAL | Status: DC
  Administered 2015-10-18 – 2015-10-20 (×3): 325 mg via ORAL
  Filled 2015-10-18 (×3): qty 1

## 2015-10-18 MED ORDER — METHOCARBAMOL 500 MG PO TABS
500.0000 mg | ORAL_TABLET | Freq: Four times a day (QID) | ORAL | Status: DC | PRN
  Administered 2015-10-19: 500 mg via ORAL
  Filled 2015-10-18: qty 1

## 2015-10-18 MED ORDER — VANCOMYCIN HCL 500 MG IV SOLR: 500.0000 mg | Freq: Two times a day (BID) | INTRAVENOUS | Status: DC

## 2015-10-18 MED ORDER — HYDROCHLOROTHIAZIDE 12.5 MG PO CAPS
12.5000 mg | ORAL_CAPSULE | Freq: Every day | ORAL | Status: DC
  Administered 2015-10-18 – 2015-10-20 (×3): 12.5 mg via ORAL
  Filled 2015-10-18 (×3): qty 1

## 2015-10-18 MED ORDER — METOCLOPRAMIDE HCL 5 MG/ML IJ SOLN: 5.0000 mg | Freq: Three times a day (TID) | INTRAMUSCULAR | Status: DC | PRN

## 2015-10-18 MED ORDER — FENTANYL CITRATE (PF) 250 MCG/5ML IJ SOLN
INTRAMUSCULAR | Status: AC
  Filled 2015-10-18: qty 5

## 2015-10-18 MED ORDER — LISINOPRIL 40 MG PO TABS
40.0000 mg | ORAL_TABLET | Freq: Every day | ORAL | Status: DC
  Administered 2015-10-18 – 2015-10-20 (×3): 40 mg via ORAL
  Filled 2015-10-18 (×3): qty 1

## 2015-10-18 MED ORDER — ONDANSETRON HCL 4 MG/2ML IJ SOLN: 4.0000 mg | Freq: Four times a day (QID) | INTRAMUSCULAR | Status: DC | PRN

## 2015-10-18 MED ORDER — CLONIDINE HCL 0.1 MG PO TABS
0.1000 mg | ORAL_TABLET | Freq: Two times a day (BID) | ORAL | Status: DC
  Administered 2015-10-18 – 2015-10-20 (×4): 0.1 mg via ORAL
  Filled 2015-10-18 (×4): qty 1

## 2015-10-18 MED ORDER — PROPOFOL 10 MG/ML IV BOLUS
INTRAVENOUS | Status: AC
  Filled 2015-10-18: qty 20

## 2015-10-18 MED ORDER — PROPOFOL 500 MG/50ML IV EMUL
INTRAVENOUS | Status: DC | PRN
  Administered 2015-10-18: 30 ug/kg/min via INTRAVENOUS

## 2015-10-18 MED ORDER — FENTANYL CITRATE (PF) 100 MCG/2ML IJ SOLN
INTRAMUSCULAR | Status: DC | PRN
  Administered 2015-10-18: 50 ug via INTRAVENOUS
  Administered 2015-10-18 (×2): 25 ug via INTRAVENOUS

## 2015-10-18 MED ORDER — TRAMADOL HCL 50 MG PO TABS
50.0000 mg | ORAL_TABLET | Freq: Four times a day (QID) | ORAL | Status: DC | PRN
  Administered 2015-10-19 – 2015-10-20 (×3): 50 mg via ORAL
  Filled 2015-10-18 (×3): qty 1

## 2015-10-18 MED ORDER — CHLORHEXIDINE GLUCONATE 4 % EX LIQD: 60.0000 mL | Freq: Once | CUTANEOUS | Status: DC

## 2015-10-18 MED ORDER — CEFAZOLIN SODIUM-DEXTROSE 2-3 GM-% IV SOLR
2.0000 g | INTRAVENOUS | Status: AC
  Administered 2015-10-18: 2 g via INTRAVENOUS
  Filled 2015-10-18: qty 50

## 2015-10-18 MED ORDER — 0.9 % SODIUM CHLORIDE (POUR BTL) OPTIME
TOPICAL | Status: DC | PRN
  Administered 2015-10-18: 1000 mL

## 2015-10-18 MED ORDER — LACTATED RINGERS IV SOLN
INTRAVENOUS | Status: DC
  Administered 2015-10-18: 18:00:00 via INTRAVENOUS

## 2015-10-18 MED ORDER — ACETAMINOPHEN 650 MG RE SUPP: 650.0000 mg | Freq: Four times a day (QID) | RECTAL | Status: DC | PRN

## 2015-10-18 MED ORDER — ATORVASTATIN CALCIUM 40 MG PO TABS
40.0000 mg | ORAL_TABLET | Freq: Every day | ORAL | Status: DC
  Administered 2015-10-18 – 2015-10-19 (×2): 40 mg via ORAL
  Filled 2015-10-18 (×2): qty 1

## 2015-10-18 MED ORDER — FENTANYL CITRATE (PF) 100 MCG/2ML IJ SOLN: 25.0000 ug | INTRAMUSCULAR | Status: DC | PRN

## 2015-10-18 MED ORDER — AMLODIPINE BESYLATE 10 MG PO TABS
10.0000 mg | ORAL_TABLET | Freq: Every day | ORAL | Status: DC
  Administered 2015-10-19 – 2015-10-20 (×2): 10 mg via ORAL
  Filled 2015-10-18 (×2): qty 1

## 2015-10-18 MED ORDER — FENTANYL CITRATE (PF) 100 MCG/2ML IJ SOLN
INTRAMUSCULAR | Status: AC
  Administered 2015-10-18: 50 ug
  Filled 2015-10-18: qty 2

## 2015-10-18 MED ORDER — METOCLOPRAMIDE HCL 5 MG PO TABS: 5.0000 mg | ORAL_TABLET | Freq: Three times a day (TID) | ORAL | Status: DC | PRN

## 2015-10-18 SURGICAL SUPPLY — 38 items
BLADE SURG 10 STRL SS (BLADE) ×2 IMPLANT
BNDG COHESIVE 6X5 TAN STRL LF (GAUZE/BANDAGES/DRESSINGS) ×3 IMPLANT
CANISTER WOUND CARE 500ML ATS (WOUND CARE) ×3 IMPLANT
COVER SURGICAL LIGHT HANDLE (MISCELLANEOUS) ×3 IMPLANT
DRAPE INCISE IOBAN 66X45 STRL (DRAPES) ×2 IMPLANT
DRAPE U-SHAPE 47X51 STRL (DRAPES) ×3 IMPLANT
DURAPREP 26ML APPLICATOR (WOUND CARE) ×3 IMPLANT
ELECT REM PT RETURN 9FT ADLT (ELECTROSURGICAL)
ELECTRODE REM PT RTRN 9FT ADLT (ELECTROSURGICAL) IMPLANT
GLOVE BIOGEL PI IND STRL 9 (GLOVE) ×1 IMPLANT
GLOVE BIOGEL PI INDICATOR 9 (GLOVE) ×2
GLOVE SURG ORTHO 9.0 STRL STRW (GLOVE) ×3 IMPLANT
GOWN STRL REUS W/ TWL LRG LVL3 (GOWN DISPOSABLE) ×1 IMPLANT
GOWN STRL REUS W/ TWL XL LVL3 (GOWN DISPOSABLE) ×2 IMPLANT
GOWN STRL REUS W/TWL LRG LVL3 (GOWN DISPOSABLE) ×6
GOWN STRL REUS W/TWL XL LVL3 (GOWN DISPOSABLE) ×6
HANDPIECE INTERPULSE COAX TIP (DISPOSABLE) ×3
IMMOBILIZER KNEE 20 (SOFTGOODS) ×3
IMMOBILIZER KNEE 20 THIGH 36 (SOFTGOODS) IMPLANT
KIT BASIN OR (CUSTOM PROCEDURE TRAY) ×3 IMPLANT
KIT ROOM TURNOVER OR (KITS) ×3 IMPLANT
MANIFOLD NEPTUNE II (INSTRUMENTS) ×3 IMPLANT
NS IRRIG 1000ML POUR BTL (IV SOLUTION) ×3 IMPLANT
PACK ORTHO EXTREMITY (CUSTOM PROCEDURE TRAY) ×3 IMPLANT
PAD ARMBOARD 7.5X6 YLW CONV (MISCELLANEOUS) ×6 IMPLANT
PREVENA INCISION MGT 90 150 (MISCELLANEOUS) ×2 IMPLANT
SET HNDPC FAN SPRY TIP SCT (DISPOSABLE) IMPLANT
STOCKINETTE IMPERVIOUS 9X36 MD (GAUZE/BANDAGES/DRESSINGS) IMPLANT
STOCKINETTE IMPERVIOUS LG (DRAPES) ×3 IMPLANT
SUT ETHILON 2 0 PSLX (SUTURE) ×6 IMPLANT
SUT PDS AB 1 CTX 36 (SUTURE) ×3 IMPLANT
TOWEL OR 17X24 6PK STRL BLUE (TOWEL DISPOSABLE) ×3 IMPLANT
TOWEL OR 17X26 10 PK STRL BLUE (TOWEL DISPOSABLE) ×3 IMPLANT
TUBE CONNECTING 12'X1/4 (SUCTIONS) ×1
TUBE CONNECTING 12X1/4 (SUCTIONS) ×2 IMPLANT
UNDERPAD 30X30 INCONTINENT (UNDERPADS AND DIAPERS) ×3 IMPLANT
WATER STERILE IRR 1000ML POUR (IV SOLUTION) ×3 IMPLANT
YANKAUER SUCT BULB TIP NO VENT (SUCTIONS) ×3 IMPLANT

## 2015-10-18 NOTE — Op Note (Signed)
10/18/2015  6:38 PM  PATIENT:  [name]    PRE-OPERATIVE DIAGNOSIS:  Dehiscence Left Total Knee Arthroplasty  POST-OPERATIVE DIAGNOSIS:  Same  PROCEDURE: Irrigation and debridement with excision of skin soft tissue muscle and tendon. Local tissue rearrangement for wound closure 10 x 3 cm. Application of Prevena wound VAC.  SURGEON:  Newt Minion, MD  PHYSICIAN ASSISTANT:None ANESTHESIA:   General  PREOPERATIVE INDICATIONS:  [name] is a  75 y.o. female with a diagnosis of Dehiscence Left Total Knee Arthroplasty who failed conservative measures and elected for surgical management.    The risks benefits and alternatives were discussed with the patient preoperatively including but not limited to the risks of infection, bleeding, nerve injury, cardiopulmonary complications, the need for revision surgery, among others, and the patient was willing to proceed.  OPERATIVE IMPLANTS: None  OPERATIVE FINDINGS: Clear drainage with wound dehiscence no purulence.  OPERATIVE PROCEDURE: Patient was brought to operating room after undergoing a femoral block she then underwent a Mack anesthetic. After adequate levels anesthesia obtained patient's left lower extremity was prepped using DuraPrep draped into a sterile field a timeout was called. An elliptical incision was made around the surgical incision to excise the area of wound dehiscence. This was excised back to healthy bleeding skin and soft tissue. A pulsatile lavage was used to irrigate the knee with 3 L of normal saline. A Cobb curette was used to further debridement of the antibiotic beads that were still present. A Ronjair was used to further debride fascia tendon and muscle. All wound edges were now bleeding healthy and viable. The medial retinacular incision was closed using #1 PDS. The skin local tissue was rearranged and wound closure with 2-0 nylon with a wound 10 x 3 cm. The wound edges approximated without tension on the  skin. A Prevena wound VAC was applied and this had a good suction fit. Patient was placed in a knee immobilizer to minimize tension on the surgical repair. Patient was taken to the PACU in stable condition.  Anticipate returning back to skilled nursing.

## 2015-10-18 NOTE — Anesthesia Procedure Notes (Addendum)
Anesthesia Regional Block:  Femoral nerve block  Pre-Anesthetic Checklist: ,, timeout performed, Correct Patient, Correct Site, Correct Laterality, Correct Procedure, Correct Position, site marked, Risks and benefits discussed, pre-op evaluation,  At surgeon's request and post-op pain management  Laterality: Left  Prep: Maximum Sterile Barrier Precautions used and chloraprep       Needles:  Injection technique: Single-shot  Needle Type: Echogenic Stimulator Needle     Needle Length: 5cm 5 cm Needle Gauge: 22 and 22 G    Additional Needles:  Procedures: ultrasound guided (picture in chart) Femoral nerve block  Nerve Stimulator or Paresthesia:  Response: Patellar respose,   Additional Responses:   Narrative:  Start time: 10/18/2015 5:15 PM End time: 10/18/2015 5:23 PM Injection made incrementally with aspirations every 5 mL. Anesthesiologist: Roderic Palau  Additional Notes: 2% Lidocaine skin wheel.

## 2015-10-18 NOTE — Anesthesia Preprocedure Evaluation (Addendum)
Anesthesia Evaluation  Patient identified by MRN, date of birth, ID band Patient awake    Reviewed: Allergy & Precautions, H&P , NPO status , Patient's Chart, lab work & pertinent test results, reviewed documented beta blocker date and time   Airway Mallampati: II  TM Distance: >3 FB Neck ROM: Full    Dental no notable dental hx. (+) Upper Dentures, Lower Dentures, Dental Advisory Given   Pulmonary neg pulmonary ROS, COPD, Current Smoker,    Pulmonary exam normal breath sounds clear to auscultation       Cardiovascular hypertension, Pt. on medications and Pt. on home beta blockers + CAD and + Peripheral Vascular Disease   Rhythm:Regular Rate:Normal     Neuro/Psych negative neurological ROS  negative psych ROS   GI/Hepatic negative GI ROS, Neg liver ROS,   Endo/Other  negative endocrine ROS  Renal/GU negative Renal ROS  negative genitourinary   Musculoskeletal  (+) Arthritis , Osteoarthritis,    Abdominal   Peds  Hematology negative hematology ROS (+)   Anesthesia Other Findings   Reproductive/Obstetrics negative OB ROS                            Anesthesia Physical Anesthesia Plan  ASA: III  Anesthesia Plan: MAC and Regional   Post-op Pain Management:    Induction: Intravenous  Airway Management Planned: Simple Face Mask  Additional Equipment:   Intra-op Plan:   Post-operative Plan:   Informed Consent: I have reviewed the patients History and Physical, chart, labs and discussed the procedure including the risks, benefits and alternatives for the proposed anesthesia with the patient or authorized representative who has indicated his/her understanding and acceptance.   Dental advisory given  Plan Discussed with: CRNA  Anesthesia Plan Comments:        Anesthesia Quick Evaluation

## 2015-10-18 NOTE — H&P (Signed)
[name] is an 75 y.o. female.   Chief Complaint: Wound dehiscence left knee HPI: Patient is a 75 year old woman status post total knee arthroplasty approximately 4 weeks ago. At 3 weeks she had an infection she underwent removal and exchange of the polyethylene placement of antibiotic beads within the knee joint and started on IV antibiotics with a PICC line. Patient presents at this time with a very slight dehiscence of the wound she presents for irrigation and debridement and wound closure.  Past Medical History  Diagnosis Date  . CAD (coronary artery disease)   . HTN (hypertension)   . Hyperlipidemia   . Cerebrovascular disease   . Arthritis   . PVD (peripheral vascular disease) (HCC)     dvt in leg  . MRSA (methicillin resistant staph aureus) culture positive     Past Surgical History  Procedure Laterality Date  . Knee surgery      denies  . Abdominal hysterectomy    . Total hip arthroplasty Right 09/10/2012    Dr Sharol Given  . Total hip arthroplasty Right 09/10/2012    Procedure: TOTAL HIP ARTHROPLASTY;  Surgeon: Newt Minion, MD;  Location: [place];  Service: Orthopedics;  Laterality: Right;  Right Total Hip Arthroplasty  . Coronary angioplasty  07    3 stents placed  . Total knee arthroplasty Left 09/28/2015    Procedure: TOTAL KNEE ARTHROPLASTY;  Surgeon: Newt Minion, MD;  Location: [place];  Service: Orthopedics;  Laterality: Left;  . I&d knee with poly exchange Left 10/12/2015    Procedure: Irrigation and Debridement , Poly Exchange, Antibiotic Beads Left Knee;  Surgeon: Newt Minion, MD;  Location: [place];  Service: Orthopedics;  Laterality: Left;    Family History  Problem Relation Age of Onset  . Breast cancer Mother   . Leukemia Father   . Stroke Sister   . Bone cancer     Social History:  reports that she has been smoking Cigarettes.  She has a 25 pack-year smoking history. She has never used smokeless tobacco. She reports that she does not drink alcohol or use  illicit drugs.  Allergies: No Known Allergies  Medications Prior to Admission  Medication Sig Dispense Refill  . amLODipine (NORVASC) 10 MG tablet TAKE 1 TABLET (10 MG TOTAL) BY MOUTH DAILY. (Patient taking differently: Take 10 mg by mouth daily. ) 90 tablet 3  . aspirin EC 325 MG tablet Take 1 tablet (325 mg total) by mouth daily. 30 tablet 0  . atorvastatin (LIPITOR) 40 MG tablet TAKE ONE TABLET BY MOUTH DAILY. (Patient taking differently: Take 40 mg by mouth daily at 6 PM. ) 90 tablet 0  . carvedilol (COREG) 6.25 MG tablet TAKE 1 TABLET (6.25 MG TOTAL) BY MOUTH 2 (TWO) TIMES DAILY WITH A MEAL. KEEP APPT FOR REFILLS (Patient taking differently: Take 6.25 mg by mouth 2 (two) times daily with a meal. ) 60 tablet 1  . cloNIDine (CATAPRES) 0.1 MG tablet Take 1 tablet (0.1 mg total) by mouth 2 (two) times daily. 60 tablet 2  . diclofenac (VOLTAREN) 75 MG EC tablet Take 75 mg by mouth 2 (two) times daily.     . Glucosamine-Chondroit-Vit C-Mn (GLUCOSAMINE-CHONDROITIN) CAPS Take 1 capsule by mouth daily.    . hydrochlorothiazide (MICROZIDE) 12.5 MG capsule TAKE 1 CAPSULE (12.5 MG TOTAL) BY MOUTH EVERY MORNING. (Patient taking differently: Take 12.5 mg by mouth daily. ) 90 capsule 3  . HYDROcodone-acetaminophen (NORCO/VICODIN) 5-325 MG tablet Take 1 tablet  by mouth 2 (two) times daily. Pt takes one in the morning and one at bedtime. In between, may have one every 4 hours prn    . lisinopril (PRINIVIL,ZESTRIL) 40 MG tablet TAKE 1 TABLET (40 MG TOTAL) BY MOUTH DAILY. (Patient taking differently: Take 40 mg by mouth daily. ) 90 tablet 3  . Multiple Vitamin (MULITIVITAMIN WITH MINERALS) TABS Take 1 tablet by mouth daily.    . potassium chloride (MICRO-K) 10 MEQ CR capsule Take 40 mEq by mouth once.    . vancomycin 500 mg in sodium chloride 0.9 % 100 mL Inject 500 mg into the vein every 12 (twelve) hours. 500 mg 84  . colchicine 0.6 MG tablet Take 0.6-1.2 mg by mouth daily as needed. gout    . levofloxacin  (LEVAQUIN) 750 MG tablet Take 1 tablet (750 mg total) by mouth every other day. (Patient not taking: Reported on 10/18/2015) 30 tablet 0  . oxyCODONE (OXY IR/ROXICODONE) 5 MG immediate release tablet Take 1-2 tablets (5-10 mg total) by mouth every 4 (four) hours as needed for breakthrough pain. (Patient not taking: Reported on 10/18/2015) 60 tablet 0  . traMADol (ULTRAM) 50 MG tablet Take 50 mg by mouth every 6 (six) hours as needed.    . traMADol (ULTRAM) 50 MG tablet Take 1 tablet (50 mg total) by mouth every 6 (six) hours as needed for moderate pain. (Patient not taking: Reported on 10/18/2015) 30 tablet 0    Results for orders placed or performed during the hospital encounter of 10/18/15 (from the past 48 hour(s))  CBC     Status: Abnormal   Collection Time: 10/18/15  3:59 PM  Result Value Ref Range   WBC 12.9 (H) 4.0 - 10.5 K/uL   RBC 3.17 (L) 3.87 - 5.11 MIL/uL   Hemoglobin 9.4 (L) 12.0 - 15.0 g/dL   HCT 28.4 (L) 36.0 - 46.0 %   MCV 89.6 78.0 - 100.0 fL   MCH 29.7 26.0 - 34.0 pg   MCHC 33.1 30.0 - 36.0 g/dL   RDW 15.1 11.5 - 15.5 %   Platelets 396 150 - 400 K/uL  Basic metabolic panel     Status: Abnormal   Collection Time: 10/18/15  3:59 PM  Result Value Ref Range   Sodium 139 135 - 145 mmol/L   Potassium 4.0 3.5 - 5.1 mmol/L   Chloride 101 101 - 111 mmol/L   CO2 24 22 - 32 mmol/L   Glucose, Bld 106 (H) 65 - 99 mg/dL   BUN 11 6 - 20 mg/dL   Creatinine, Ser 0.72 0.44 - 1.00 mg/dL   Calcium 9.3 8.9 - 10.3 mg/dL   GFR calc non Af Amer >60 >60 mL/min   GFR calc Af Amer >60 >60 mL/min    Comment: (NOTE) The eGFR has been calculated using the CKD EPI equation. This calculation has not been validated in all clinical situations. eGFR's persistently <60 mL/min signify possible Chronic Kidney Disease.    Anion gap 14 5 - 15   No results found.  Review of Systems  All other systems reviewed and are negative.   Blood pressure 144/50, pulse 88, resp. rate 16, height 5' 3.5"  (1.613 m), weight 55.339 kg (122 lb), SpO2 100 %. Physical Exam  On examination the incision is almost completely healed except for an area of about 2 cm x 5 mm of wound dehiscence. Patient has clear drainage there is no purulence. There is mild redness around the area of wound  dehiscence but no cellulitis around the knee there is no tenderness with range of motion. There is no effusion. Assessment/Plan Assessment: Wound dehiscence left knee.  Plan: We will plan for irrigation and debridement pulsatile lavage wound closure placement of a Prevena wound VAC. Continue IV antibiotics.  Newt Minion, MD 10/18/2015, 5:37 PM

## 2015-10-18 NOTE — Anesthesia Postprocedure Evaluation (Signed)
Anesthesia Post Note  Patient: [name]  Procedure(s) Performed: Procedure(s) (LRB): Left Knee Washout, Reclosure (Left)  Patient location during evaluation: PACU Anesthesia Type: MAC and Regional Level of consciousness: awake Pain management: pain level controlled Vital Signs Assessment: post-procedure vital signs reviewed and stable Respiratory status: spontaneous breathing Cardiovascular status: stable Postop Assessment: no signs of nausea or vomiting Anesthetic complications: no    Last Vitals:  Filed Vitals:   10/18/15 2000 10/18/15 2033  BP: 109/62 143/63  Pulse: 45   Temp: 36.2 C 36.4 C  Resp: 17 20    Last Pain:  Filed Vitals:   10/18/15 2218  PainSc: 6                  Jonella Redditt

## 2015-10-18 NOTE — Progress Notes (Signed)
Patient on monitor for regional anesthesia, having bi & trigeminy PVC'S.

## 2015-10-18 NOTE — Progress Notes (Signed)
Pharmacy Antibiotic Note  [name] is a 75 y.o. female admitted on 10/18/2015 with Irrigation and debridement  Left Total Knee Arthroplasty.  Pharmacy has been consulted for Vancomycin dosing.  She is S/P  total knee arthroplasty approximately 4 weeks ago. At 3 weeks she had an infection she underwent removal and exchange of the polyethylene placement of antibiotic beads within the knee joint and started on IV Vancomycin '500mg'$  q12 with a PICC line - last dose 3/20. Patient presents at this time with a very slight dehiscence of the wound she presents for irrigation and debridement and wound closure.  Cefazolin was given in OR Wbc slightly elevated 12.9, Cr stable 0.72 wound Cx from 3/15 MRSA Will continue outpatient dose vanc and check level this week. Will Stop Levofloxacin as per ID instructions - Note 10/17/15  Plan: Vancomycin '500mg'$  IV q12  Height: 5' 3.5" (161.3 cm) Weight: 122 lb (55.339 kg) IBW/kg (Calculated) : 53.55  Temp (24hrs), Avg:97.4 F (36.3 C), Min:97.1 F (36.2 C), Max:97.6 F (36.4 C)   Recent Labs Lab 10/12/15 1825 10/15/15 0632 10/18/15 1559  WBC 22.6*  --  12.9*  CREATININE 0.98 0.72 0.72    Estimated Creatinine Clearance: 52.2 mL/min (by C-G formula based on Cr of 0.72).    No Known Allergies  Antimicrobials this admission: Vancomycin '500mg'$  q12 Levofloxacin restarted on admit - because on home med list - will stop as ID instructions 10/17/15 phone note.  Dose adjustments this admission:   Microbiology results: 3/15 Tissue Cx MRSA   Bonnita Nasuti Pharm.D. CPP, BCPS Clinical Pharmacist [phone] 10/18/2015 9:00 PM

## 2015-10-18 NOTE — Transfer of Care (Signed)
Immediate Anesthesia Transfer of Care Note  Patient: [name]  Procedure(s) Performed: Procedure(s): Left Knee Washout, Reclosure (Left)  Patient Location: PACU  Anesthesia Type:MAC and Regional  Level of Consciousness: awake, alert , oriented and patient cooperative  Airway & Oxygen Therapy: Patient Spontanous Breathing and Patient connected to nasal cannula oxygen  Post-op Assessment: Report given to RN, Post -op Vital signs reviewed and stable, Patient moving all extremities and Patient moving all extremities X 4  Post vital signs: Reviewed and stable  Last Vitals:  Filed Vitals:   10/18/15 1715 10/18/15 1720  BP: 144/50   Pulse: 89 88  Resp: 19 16    Complications: No apparent anesthesia complications

## 2015-10-19 ENCOUNTER — Encounter (HOSPITAL_COMMUNITY): Payer: Self-pay | Admitting: Orthopedic Surgery

## 2015-10-19 DIAGNOSIS — M25569 Pain in unspecified knee: Secondary | ICD-10-CM | POA: Diagnosis not present

## 2015-10-19 DIAGNOSIS — J449 Chronic obstructive pulmonary disease, unspecified: Secondary | ICD-10-CM | POA: Diagnosis present

## 2015-10-19 DIAGNOSIS — T8131XD Disruption of external operation (surgical) wound, not elsewhere classified, subsequent encounter: Secondary | ICD-10-CM | POA: Diagnosis not present

## 2015-10-19 DIAGNOSIS — I10 Essential (primary) hypertension: Secondary | ICD-10-CM | POA: Diagnosis present

## 2015-10-19 DIAGNOSIS — T8131XA Disruption of external operation (surgical) wound, not elsewhere classified, initial encounter: Secondary | ICD-10-CM | POA: Diagnosis not present

## 2015-10-19 DIAGNOSIS — Z87891 Personal history of nicotine dependence: Secondary | ICD-10-CM | POA: Diagnosis not present

## 2015-10-19 DIAGNOSIS — F17210 Nicotine dependence, cigarettes, uncomplicated: Secondary | ICD-10-CM | POA: Diagnosis present

## 2015-10-19 DIAGNOSIS — Z79899 Other long term (current) drug therapy: Secondary | ICD-10-CM | POA: Diagnosis not present

## 2015-10-19 DIAGNOSIS — T8454XD Infection and inflammatory reaction due to internal left knee prosthesis, subsequent encounter: Secondary | ICD-10-CM | POA: Diagnosis not present

## 2015-10-19 DIAGNOSIS — Z96641 Presence of right artificial hip joint: Secondary | ICD-10-CM | POA: Diagnosis present

## 2015-10-19 DIAGNOSIS — Z86718 Personal history of other venous thrombosis and embolism: Secondary | ICD-10-CM | POA: Diagnosis not present

## 2015-10-19 DIAGNOSIS — I714 Abdominal aortic aneurysm, without rupture: Secondary | ICD-10-CM | POA: Diagnosis not present

## 2015-10-19 DIAGNOSIS — Y838 Other surgical procedures as the cause of abnormal reaction of the patient, or of later complication, without mention of misadventure at the time of the procedure: Secondary | ICD-10-CM | POA: Diagnosis present

## 2015-10-19 DIAGNOSIS — I723 Aneurysm of iliac artery: Secondary | ICD-10-CM | POA: Diagnosis not present

## 2015-10-19 DIAGNOSIS — E785 Hyperlipidemia, unspecified: Secondary | ICD-10-CM | POA: Diagnosis present

## 2015-10-19 DIAGNOSIS — Z96652 Presence of left artificial knee joint: Secondary | ICD-10-CM | POA: Diagnosis present

## 2015-10-19 DIAGNOSIS — I2510 Atherosclerotic heart disease of native coronary artery without angina pectoris: Secondary | ICD-10-CM | POA: Diagnosis present

## 2015-10-19 DIAGNOSIS — Z7982 Long term (current) use of aspirin: Secondary | ICD-10-CM | POA: Diagnosis not present

## 2015-10-19 DIAGNOSIS — M1990 Unspecified osteoarthritis, unspecified site: Secondary | ICD-10-CM | POA: Diagnosis not present

## 2015-10-19 DIAGNOSIS — R0989 Other specified symptoms and signs involving the circulatory and respiratory systems: Secondary | ICD-10-CM | POA: Diagnosis not present

## 2015-10-19 DIAGNOSIS — Z8673 Personal history of transient ischemic attack (TIA), and cerebral infarction without residual deficits: Secondary | ICD-10-CM | POA: Diagnosis not present

## 2015-10-19 DIAGNOSIS — I739 Peripheral vascular disease, unspecified: Secondary | ICD-10-CM | POA: Diagnosis present

## 2015-10-19 NOTE — Progress Notes (Signed)
RN entered room when pts bed alarm going off. Pt stated she needed to go to the bathroom. Upon getting patient up to bedside commode RN noticed PICC line lying in pts bed. Pt stated she did not remove it. Gauze applied to PICC line site, bleeding controlled. IV team notified and will place a peripheral for now. Dr. Sharol Given made aware. Nursing will continue to monitor.

## 2015-10-19 NOTE — Clinical Social Work Note (Signed)
Clinical Social Work Assessment  Patient Details  Name: [name] MRN: [id number] Date of Birth: 03/13/1941  Date of referral:  10/19/15               Reason for consult:  Discharge Planning                Permission sought to share information with:  Chartered certified accountant granted to share information::  Yes, Verbal Permission Granted  Name::        Agency::  [facility]  Relationship::     Contact Information:     Housing/Transportation Living arrangements for the past 2 months:  Single Family Home Source of Information:  Patient Patient Interpreter Needed:  None Criminal Activity/Legal Involvement Pertinent to Current Situation/Hospitalization:  No - Comment as needed Significant Relationships:  Adult Children Lives with:  Self Do you feel safe going back to the place where you live?  Yes Need for family participation in patient care:  No (Coment) (Patient able to make own decisions.)  Care giving concerns:  Patient expressed no concerns at this time.   Social Worker assessment / plan:  CSW received referral regarding SNF placement at time of discharge. Per chart review, patient admitted to [facility] from [facility]. Patient states patient would prefer to return to [facility] at time of discharge. CSW confirmed with [facility] CSW that patient is able to return once medically stable for discharge. CSW to continue to follow and assist with discharge planning needs.  Employment status:  Other (Comment) (Did not disclose.) Insurance information:  Medicare PT Recommendations:  Not assessed at this time Information / Referral to community resources:  [place]  Patient/Family's Response to care:  Patient understanding and agreeable to CSW plan of care.  Patient/Family's Understanding of and Emotional Response to Diagnosis, Current Treatment, and Prognosis:  Patient understanding and agreeable to CSW plan of care.  Emotional  Assessment Appearance:  Appears stated age Attitude/Demeanor/Rapport:  Other (Appropriate) Affect (typically observed):  Accepting, Appropriate, Pleasant Orientation:  Oriented to Self, Oriented to Place, Oriented to  Time, Oriented to Situation Alcohol / Substance use:  Not Applicable Psych involvement (Current and /or in the community):  No (Comment) (Not appropriate on this admission)  Discharge Needs  Concerns to be addressed:  No discharge needs identified Readmission within the last 30 days:  Yes Current discharge risk:  None Barriers to Discharge:  No Barriers Identified   Caroline Sauger, LCSW 10/19/2015, 11:29 AM [phone]

## 2015-10-19 NOTE — Evaluation (Signed)
Physical Therapy Evaluation Patient Details Name: [name] MRN: [id number] DOB: [birth year] Today's Date: 10/19/2015   History of Present Illness  75 year old woman status post total knee arthroplasty approximately 4 weeks ago. At 3 weeks she had an infection she underwent removal and exchange of the polyethylene placement of antibiotic beads within the knee joint and started on IV antibiotics with a PICC line. Patient presents at this time with a very slight dehiscence of the wound she presents for irrigation and debridement and wound closure. PMH: CAD, HTN, cerebrobascular disease, PVD, MRSA, THA Rt.   Clinical Impression  Patient seen for initial evaluation and treatment. Pt able to ambulate 25 feet in room with min guard assistance. The patient is noted to be confused and demonstrating poor safety awareness throughout the session. Recommending SNF when medically stable. PT to continue to follow and progress as appropriate.     Follow Up Recommendations SNF;Supervision/Assistance - 24 hour    Equipment Recommendations  None recommended by PT    Recommendations for Other Services       Precautions / Restrictions Precautions Precautions: Knee;Fall Required Braces or Orthoses: Knee Immobilizer - Left Knee Immobilizer - Left: On at all times Restrictions Weight Bearing Restrictions: Yes LLE Weight Bearing: Weight bearing as tolerated      Mobility  Bed Mobility Overal bed mobility: Needs Assistance Bed Mobility: Supine to Sit     Supine to sit: Supervision     General bed mobility comments: cues for patient to come to sitting EOB.   Transfers Overall transfer level: Needs assistance Equipment used: Rolling walker (2 wheeled) Transfers: Sit to/from Stand Sit to Stand: Min assist         General transfer comment: cues for hand placement and multiple reminder of task.   Ambulation/Gait Ambulation/Gait assistance: Min guard Ambulation Distance (Feet): 25  Feet Assistive device: Rolling walker (2 wheeled) Gait Pattern/deviations: Step-through pattern Gait velocity: decreased   General Gait Details: occasional assist needed to maneuver rw with turns. Pt refusing to attempt ambulation outside of room.   Stairs            Wheelchair Mobility    Modified Rankin (Stroke Patients Only)       Balance Overall balance assessment: Needs assistance Sitting-balance support: No upper extremity supported Sitting balance-Leahy Scale: Good     Standing balance support: Bilateral upper extremity supported Standing balance-Leahy Scale: Poor Standing balance comment: using rw                             Pertinent Vitals/Pain Pain Assessment: Faces Faces Pain Scale: Hurts a little bit Pain Location: Lt knee Pain Descriptors / Indicators: Guarding Pain Intervention(s): Monitored during session    Home Living Family/patient expects to be discharged to:: Skilled nursing facility                 Additional Comments: Pt reports that she was living along prior to this admission and she states she is going back to living alone when she leaves.     Prior Function Level of Independence: Needs assistance         Comments: difficult to determine, patient previously at SNF.      Hand Dominance        Extremity/Trunk Assessment   Upper Extremity Assessment: Overall WFL for tasks assessed           Lower Extremity Assessment: LLE deficits/detail   LLE Deficits /  Details: poor quad activation     Communication   Communication: No difficulties  Cognition Arousal/Alertness: Awake/alert Behavior During Therapy: WFL for tasks assessed/performed Overall Cognitive Status: No family/caregiver present to determine baseline cognitive functioning Area of Impairment: Safety/judgement;Orientation Orientation Level: Disoriented to;Time     Following Commands: Follows one step commands consistently Safety/Judgement:  Decreased awareness of safety   Problem Solving: Slow processing General Comments: Unknown cognitive level prior to initial surgery. No family present to confirm. Noted previous cognitive issues during previous admissions.     General Comments      Exercises Total Joint Exercises Ankle Circles/Pumps: AROM;Both;10 reps Quad Sets: Strengthening;Left;10 reps Heel Slides: AAROM;Left;10 reps Hip ABduction/ADduction: Strengthening;Left;10 reps Goniometric ROM: knee flexion approximately 80 degrees.       Assessment/Plan    PT Assessment Patient needs continued PT services  PT Diagnosis Difficulty walking;Generalized weakness   PT Problem List Decreased strength;Decreased range of motion;Decreased activity tolerance;Decreased balance;Decreased mobility;Decreased safety awareness  PT Treatment Interventions DME instruction;Gait training;Functional mobility training;Therapeutic activities;Therapeutic exercise;Balance training   PT Goals (Current goals can be found in the Care Plan section) Acute Rehab PT Goals Patient Stated Goal: be able to walk and move around better.  PT Goal Formulation: With patient Time For Goal Achievement: 11/02/15 Potential to Achieve Goals: Good    Frequency Min 3X/week   Barriers to discharge        Co-evaluation               End of Session Equipment Utilized During Treatment: Gait belt;Left knee immobilizer Activity Tolerance: Patient tolerated treatment well Patient left: in chair;with call bell/phone within reach;with chair alarm set Nurse Communication: Mobility status;Weight bearing status    Functional Assessment Tool Used: clinical judgment Functional Limitation: Mobility: Walking and moving around Mobility: Walking and Moving Around Current Status [phone]): At least 20 percent but less than 40 percent impaired, limited or restricted Mobility: Walking and Moving Around Goal Status [phone]): At least 1 percent but less than 20 percent  impaired, limited or restricted    Time: 1128-1153 PT Time Calculation (min) (ACUTE ONLY): 25 min   Charges:   PT Evaluation $PT Eval Moderate Complexity: 1 Procedure PT Treatments $Gait Training: 8-22 mins   PT G Codes:   PT G-Codes **NOT FOR INPATIENT CLASS** Functional Assessment Tool Used: clinical judgment Functional Limitation: Mobility: Walking and moving around Mobility: Walking and Moving Around Current Status (W2376): At least 20 percent but less than 40 percent impaired, limited or restricted Mobility: Walking and Moving Around Goal Status [phone]): At least 1 percent but less than 20 percent impaired, limited or restricted    Cassell Clement, PT, [place] Pager [phone] Office [phone]  10/19/2015, 12:21 PM

## 2015-10-19 NOTE — Progress Notes (Signed)
Tried to place PICC in the right upper arm. Able to get in the vein, occlusion noted in the Axilla area. Will try left arm tomorrow.

## 2015-10-19 NOTE — NC FL2 (Signed)
[place] LEVEL OF CARE SCREENING TOOL     IDENTIFICATION  Patient Name: [name] Birthdate: 01-23-1941 Sex: female Admission Date (Current Location): 10/18/2015  [facility] and [place] Number:  Herbalist and Address:  [facility] [address], [place] [address], [place], [place] [address]      Provider Number: [id number]  Attending Physician Name and Address:  Newt Minion, MD  Relative Name and Phone Number:       Current Level of Care: Hospital Recommended Level of Care: [place] Prior Approval Number:    Date Approved/Denied:   PASRR Number: [id number]  Discharge Plan: SNF    Current Diagnoses: Patient Active Problem List   Diagnosis Date Noted  . Wound dehiscence, surgical 10/18/2015  . Septic arthritis of knee, left ([place]) 10/12/2015  . Total knee replacement status 09/28/2015  . Bruit 07/10/2012  . Leukocytosis 09/19/2011  . Encephalopathy 09/19/2011  . AAA 01/19/2009  . ILIAC ARTERY ANEURYSM 01/19/2009  . HYPERLIPIDEMIA-MIXED 01/14/2009  . TOBACCO ABUSE 01/14/2009  . Essential hypertension 01/14/2009  . Coronary atherosclerosis 01/14/2009  . Cerebrovascular disease 01/14/2009  . Peripheral vascular disease ([place]) 01/14/2009  . CHRONIC OBSTRUCTIVE PULMONARY DISEASE 01/14/2009  . CHEST PAIN-UNSPECIFIED 01/14/2009    Orientation RESPIRATION BLADDER Height & Weight     Self, Time, Situation, Place  Normal Continent Weight: 122 lb (55.339 kg) Height:  5' 3.5" (161.3 cm)  BEHAVIORAL SYMPTOMS/MOOD NEUROLOGICAL BOWEL NUTRITION STATUS   (n/a)   Continent    AMBULATORY STATUS COMMUNICATION OF NEEDS Skin   Limited Assist Verbally Surgical wounds                       Personal Care Assistance Level of Assistance  Bathing, Feeding, Dressing Bathing Assistance: Limited assistance Feeding assistance: Independent Dressing Assistance: Limited assistance     Functional Limitations Info              SPECIAL CARE FACTORS FREQUENCY  PT (By licensed PT), OT (By licensed OT)                    Contractures      Additional Factors Info  Code Status, Allergies, Isolation Precautions Code Status Info: FULL Allergies Info: No known     Isolation Precautions Info: MRSA     Current Medications (10/19/2015):  This is the current hospital active medication list Current Facility-Administered Medications  Medication Dose Route Frequency Provider Last Rate Last Dose  . 0.9 %  sodium chloride infusion   Intravenous Continuous Newt Minion, MD 10 mL/hr at 10/19/15 [phone]    . acetaminophen (TYLENOL) tablet 650 mg  650 mg Oral Q6H PRN Newt Minion, MD       Or  . acetaminophen (TYLENOL) suppository 650 mg  650 mg Rectal Q6H PRN Newt Minion, MD      . amLODipine (NORVASC) tablet 10 mg  10 mg Oral Daily Newt Minion, MD   10 mg at 10/19/15 0954  . aspirin EC tablet 325 mg  325 mg Oral Daily Newt Minion, MD   325 mg at 10/19/15 0955  . atorvastatin (LIPITOR) tablet 40 mg  40 mg Oral q1800 Newt Minion, MD   40 mg at 10/18/15 2215  . carvedilol (COREG) tablet 6.25 mg  6.25 mg Oral BID WC Newt Minion, MD   6.25 mg at 10/19/15 0715  . cloNIDine (CATAPRES) tablet 0.1 mg  0.1 mg Oral BID Newt Minion, MD   0.1 mg at 10/19/15 0954  . hydrochlorothiazide (MICROZIDE) capsule 12.5 mg  12.5 mg Oral Daily Newt Minion, MD   12.5 mg at 10/19/15 0955  . HYDROcodone-acetaminophen (NORCO/VICODIN) 5-325 MG per tablet 1 tablet  1 tablet Oral BID Newt Minion, MD   1 tablet at 10/19/15 0955  . HYDROcodone-acetaminophen (NORCO/VICODIN) 5-325 MG per tablet 1-2 tablet  1-2 tablet Oral Q4H PRN Newt Minion, MD      . lisinopril (PRINIVIL,ZESTRIL) tablet 40 mg  40 mg Oral Daily Newt Minion, MD   40 mg at 10/19/15 0955  . methocarbamol (ROBAXIN) tablet 500 mg  500 mg Oral Q6H PRN Newt Minion, MD   500 mg at 10/19/15 0849   Or  . methocarbamol (ROBAXIN) 500 mg in dextrose 5 % 50 mL IVPB  500 mg  Intravenous Q6H PRN Newt Minion, MD      . metoCLOPramide (REGLAN) tablet 5-10 mg  5-10 mg Oral Q8H PRN Newt Minion, MD       Or  . metoCLOPramide (REGLAN) injection 5-10 mg  5-10 mg Intravenous Q8H PRN Newt Minion, MD      . multivitamin with minerals tablet 1 tablet  1 tablet Oral Daily Newt Minion, MD   1 tablet at 10/19/15 0955  . ondansetron (ZOFRAN) tablet 4 mg  4 mg Oral Q6H PRN Newt Minion, MD       Or  . ondansetron [facility]) injection 4 mg  4 mg Intravenous Q6H PRN Newt Minion, MD      . potassium chloride (K-DUR,KLOR-CON) CR tablet 10 mEq  10 mEq Oral Daily Newt Minion, MD   10 mEq at 10/19/15 0954  . traMADol (ULTRAM) tablet 50 mg  50 mg Oral Q6H PRN Newt Minion, MD   50 mg at 10/19/15 0715  . vancomycin (VANCOCIN) 500 mg in sodium chloride 0.9 % 100 mL IVPB  500 mg Intravenous Q12H Newt Minion, MD   500 mg at 10/19/15 6295     Discharge Medications: Please see discharge summary for a list of discharge medications.  Relevant Imaging Results:  Relevant Lab Results:   Additional Information SSN: [id number]  Windy Kalata, [facility]: [phone] Surgical: [phone]

## 2015-10-19 NOTE — Progress Notes (Signed)
Patient ID: [name], female   DOB: 02/19/1941, 75 y.o.   MRN: [id number] Patient is alert and oriented this morning denies any pain. The wound VAC is functioning well. Patient's initial wound cultures were positive for MRSA. Her PCR screen was negative for staph or MRSA. Patient pulled out her PICC line last night. Orders written for new PICC line. Contact precautions were restarted. Plan for discharge back to skilled nursing on Thursday.

## 2015-10-20 DIAGNOSIS — Z792 Long term (current) use of antibiotics: Secondary | ICD-10-CM | POA: Diagnosis not present

## 2015-10-20 DIAGNOSIS — I2510 Atherosclerotic heart disease of native coronary artery without angina pectoris: Secondary | ICD-10-CM | POA: Diagnosis not present

## 2015-10-20 DIAGNOSIS — Z8673 Personal history of transient ischemic attack (TIA), and cerebral infarction without residual deficits: Secondary | ICD-10-CM | POA: Diagnosis not present

## 2015-10-20 DIAGNOSIS — R0989 Other specified symptoms and signs involving the circulatory and respiratory systems: Secondary | ICD-10-CM | POA: Diagnosis not present

## 2015-10-20 DIAGNOSIS — Z471 Aftercare following joint replacement surgery: Secondary | ICD-10-CM | POA: Diagnosis not present

## 2015-10-20 DIAGNOSIS — Y831 Surgical operation with implant of artificial internal device as the cause of abnormal reaction of the patient, or of later complication, without mention of misadventure at the time of the procedure: Secondary | ICD-10-CM | POA: Diagnosis not present

## 2015-10-20 DIAGNOSIS — I714 Abdominal aortic aneurysm, without rupture: Secondary | ICD-10-CM | POA: Diagnosis not present

## 2015-10-20 DIAGNOSIS — Z86718 Personal history of other venous thrombosis and embolism: Secondary | ICD-10-CM | POA: Diagnosis not present

## 2015-10-20 DIAGNOSIS — M25569 Pain in unspecified knee: Secondary | ICD-10-CM | POA: Diagnosis not present

## 2015-10-20 DIAGNOSIS — M25562 Pain in left knee: Secondary | ICD-10-CM | POA: Diagnosis not present

## 2015-10-20 DIAGNOSIS — Z7901 Long term (current) use of anticoagulants: Secondary | ICD-10-CM | POA: Diagnosis not present

## 2015-10-20 DIAGNOSIS — I739 Peripheral vascular disease, unspecified: Secondary | ICD-10-CM | POA: Diagnosis not present

## 2015-10-20 DIAGNOSIS — Z87891 Personal history of nicotine dependence: Secondary | ICD-10-CM | POA: Diagnosis not present

## 2015-10-20 DIAGNOSIS — Z791 Long term (current) use of non-steroidal anti-inflammatories (NSAID): Secondary | ICD-10-CM | POA: Diagnosis not present

## 2015-10-20 DIAGNOSIS — R2689 Other abnormalities of gait and mobility: Secondary | ICD-10-CM | POA: Diagnosis not present

## 2015-10-20 DIAGNOSIS — Z955 Presence of coronary angioplasty implant and graft: Secondary | ICD-10-CM | POA: Diagnosis not present

## 2015-10-20 DIAGNOSIS — Z79899 Other long term (current) drug therapy: Secondary | ICD-10-CM | POA: Diagnosis not present

## 2015-10-20 DIAGNOSIS — M1990 Unspecified osteoarthritis, unspecified site: Secondary | ICD-10-CM | POA: Diagnosis not present

## 2015-10-20 DIAGNOSIS — S81002A Unspecified open wound, left knee, initial encounter: Secondary | ICD-10-CM | POA: Diagnosis not present

## 2015-10-20 DIAGNOSIS — T8189XA Other complications of procedures, not elsewhere classified, initial encounter: Secondary | ICD-10-CM | POA: Diagnosis not present

## 2015-10-20 DIAGNOSIS — T8454XD Infection and inflammatory reaction due to internal left knee prosthesis, subsequent encounter: Secondary | ICD-10-CM | POA: Diagnosis not present

## 2015-10-20 DIAGNOSIS — I723 Aneurysm of iliac artery: Secondary | ICD-10-CM | POA: Diagnosis not present

## 2015-10-20 DIAGNOSIS — T8131XD Disruption of external operation (surgical) wound, not elsewhere classified, subsequent encounter: Secondary | ICD-10-CM | POA: Diagnosis not present

## 2015-10-20 DIAGNOSIS — E785 Hyperlipidemia, unspecified: Secondary | ICD-10-CM | POA: Diagnosis not present

## 2015-10-20 DIAGNOSIS — J449 Chronic obstructive pulmonary disease, unspecified: Secondary | ICD-10-CM | POA: Diagnosis not present

## 2015-10-20 DIAGNOSIS — F17210 Nicotine dependence, cigarettes, uncomplicated: Secondary | ICD-10-CM | POA: Diagnosis not present

## 2015-10-20 DIAGNOSIS — Z96641 Presence of right artificial hip joint: Secondary | ICD-10-CM | POA: Diagnosis not present

## 2015-10-20 DIAGNOSIS — I10 Essential (primary) hypertension: Secondary | ICD-10-CM | POA: Diagnosis not present

## 2015-10-20 DIAGNOSIS — Z96652 Presence of left artificial knee joint: Secondary | ICD-10-CM | POA: Diagnosis not present

## 2015-10-20 DIAGNOSIS — T8131XA Disruption of external operation (surgical) wound, not elsewhere classified, initial encounter: Secondary | ICD-10-CM | POA: Diagnosis not present

## 2015-10-20 LAB — VANCOMYCIN, TROUGH: Vancomycin Tr: 9 ug/mL — ABNORMAL LOW (ref 10.0–20.0)

## 2015-10-20 MED ORDER — VANCOMYCIN HCL IN DEXTROSE 750-5 MG/150ML-% IV SOLN
750.0000 mg | Freq: Two times a day (BID) | INTRAVENOUS | Status: DC
  Filled 2015-10-20: qty 150

## 2015-10-20 MED ORDER — SODIUM CHLORIDE 0.9% FLUSH: 10.0000 mL | INTRAVENOUS | Status: DC | PRN

## 2015-10-20 NOTE — Progress Notes (Signed)
Peripherally Inserted Central Catheter/Midline Placement  The IV Nurse has discussed with the patient and/or persons authorized to consent for the patient, the purpose of this procedure and the potential benefits and risks involved with this procedure.  The benefits include less needle sticks, lab draws from the catheter and patient may be discharged home with the catheter.  Risks include, but not limited to, infection, bleeding, blood clot (thrombus formation), and puncture of an artery; nerve damage and irregular heat beat.  Alternatives to this procedure were also discussed.  PICC/Midline Placement Documentation  PICC Single Lumen 70/14/10 PICC Left Basilic 41 cm 0 cm (Active)  Indication for Insertion or Continuance of Line Home intravenous therapies (PICC only) 10/20/2015 11:47 AM  Exposed Catheter (cm) 0 cm 10/20/2015 11:47 AM  Site Assessment Clean;Dry;Intact 10/20/2015 11:47 AM  Line Status Flushed;Saline locked;Blood return noted 10/20/2015 11:47 AM  Dressing Type Transparent 10/20/2015 11:47 AM  Dressing Status Clean;Dry;Intact;Antimicrobial disc in place 10/20/2015 11:47 AM  Line Care Connections checked and tightened;Cap(s) changed 10/20/2015 11:47 AM  Line Adjustment (NICU/IV Team Only) No 10/20/2015 11:47 AM  Dressing Intervention New dressing 10/20/2015 11:47 AM  Dressing Change Due 10/27/15 10/20/2015 11:47 AM       Rolena Infante 10/20/2015, 11:48 AM

## 2015-10-20 NOTE — Progress Notes (Signed)
Pt ready for d/c to SNF per MD. PICC line placed by IV team in L arm. Wound vac changed to prevena vac per Dr. Sharol Given. Peripheral IV removed. Report called to [facility] at [facility], all questions answered. Belongings (clothing and shoes) will be sent with Orlean Bradford, Jerry Caras

## 2015-10-20 NOTE — Clinical Social Work Note (Signed)
Patient to return to [facility] SNF at time of discharge. CSW provided [facility] SNF with appropriate discharge documentation. Patient bed available and ready at 2:30pm. CSW has arranged for EMS transportation at first availability.  Lubertha Sayres, [facility]: [phone] Surgical: [phone]

## 2015-10-20 NOTE — Progress Notes (Signed)
Patient ID: [name], female   DOB: 20-Jan-1941, 75 y.o.   MRN: [id number] Patient pulled out her IV last night. PICC line to be placed today unsuccessful placement yesterday. Plan for discharge to skilled nursing on Friday. Wound VAC is functioning well.

## 2015-10-20 NOTE — Progress Notes (Signed)
Pharmacy Antibiotic Note  [name] is a 75 y.o. female admitted on 10/18/2015 with Irrigation and debridement  Left Total Knee Arthroplasty.  Pharmacy has been consulted for Vancomycin dosing.  She is S/P total knee arthroplasty approximately 4 weeks ago. At 3 weeks she had an infection and underwent removal and exchange of the polyethylene with placement of antibiotic beads within the knee joint and started on IV Vancomycin '500mg'$  q12 with a PICC line - last dose 3/20.  Patient presents at this time with a very slight dehiscence of the wound.  She underwent irrigation and debridement and wound closure with wound VAC placement 3/21.   Vancomycin trough today on '500mg'$  IV q12h was low at 9.  Goal 10-15.  Plan: Increase Vancomycin '750mg'$  IV q12 (estimated trough 13)  Height: 5' 3.5" (161.3 cm) Weight: 122 lb (55.339 kg) IBW/kg (Calculated) : 53.55  Temp (24hrs), Avg:97.5 F (36.4 C), Min:97 F (36.1 C), Max:98.1 F (36.7 C)   Recent Labs Lab 10/15/15 0632 10/18/15 1559 10/20/15 0822  WBC  --  12.9*  --   CREATININE 0.72 0.72  --   VANCOTROUGH  --   --  9*    Estimated Creatinine Clearance: 52.2 mL/min (by C-G formula based on Cr of 0.72).    No Known Allergies  Antimicrobials this admission: Vancomycin PTA >>  Dose adjustments this admission: 3/23 VT = 9 (~1.5 hr before dose due) dose increased to '750mg'$  q12h  Microbiology results: 3/15 wound >> abundant MRSA   [employer], Pharm.D., BCPS Clinical Pharmacist Pager [phone] 10/20/2015 11:16 AM

## 2015-10-20 NOTE — Discharge Summary (Signed)
Physician Discharge Summary  Patient ID: [name] MRN: [id number] DOB/AGE: 31-Jul-1940 75 y.o.  Admit date: 10/18/2015 Discharge date: 10/20/2015  Admission Diagnoses:wound dehiscence left knee  Discharge Diagnoses:  Active Problems:   Wound dehiscence, surgical   Discharged Condition: stable  Hospital Course: patient underwent debridement of her knee incision placement of prevena wound vac.  She pulled out her PICC line and new line placed before discharge  Consults: None  Significant Diagnostic Studies: labs: routine labs  Treatments: surgery: see operative note  Discharge Exam: Blood pressure 138/62, pulse 88, temperature 97.6 F (36.4 C), temperature source Oral, resp. rate 18, height 5' 3.5" (1.613 m), weight 55.339 kg (122 lb), SpO2 97 %. Incision/Wound:dressing clean and dry, VAC working well  Disposition: 03-Skilled Nursing Facility  Discharge Instructions    Call MD / Call 911    Complete by:  As directed   If you experience chest pain or shortness of breath, CALL 911 and be transported to the hospital emergency room.  If you develope a fever above 101 F, pus (white drainage) or increased drainage or redness at the wound, or calf pain, call your surgeon's office.     Constipation Prevention    Complete by:  As directed   Drink plenty of fluids.  Prune juice may be helpful.  You may use a stool softener, such as Colace (over the counter) 100 mg twice a day.  Use MiraLax (over the counter) for constipation as needed.     Diet - low sodium heart healthy    Complete by:  As directed      For home use only DME Negative pressure wound device    Complete by:  As directed   Use the portable Prevena wound VAC for 1 week then D/C dressing and start dry dressing changes  Frequency of dressing change:  Other see comments  Length of need:  Other see comments  Dressing type:  Foam  Amount of suction:  100 mm/Hg  Pressure application:  Continuous pressure  Supplies:  10  canisters and 15 dressings per month for duration of therapy     Increase activity slowly as tolerated    Complete by:  As directed      Maintain Knee Immobilizer    Complete by:  As directed   Wear knee immobilizer at all times            Medication List    STOP taking these medications        levofloxacin 750 MG tablet  Commonly known as:  LEVAQUIN     oxyCODONE 5 MG immediate release tablet  Commonly known as:  Oxy IR/ROXICODONE      TAKE these medications        amLODipine 10 MG tablet  Commonly known as:  NORVASC  TAKE 1 TABLET (10 MG TOTAL) BY MOUTH DAILY.     aspirin EC 325 MG tablet  Take 1 tablet (325 mg total) by mouth daily.     atorvastatin 40 MG tablet  Commonly known as:  LIPITOR  TAKE ONE TABLET BY MOUTH DAILY.     carvedilol 6.25 MG tablet  Commonly known as:  COREG  TAKE 1 TABLET (6.25 MG TOTAL) BY MOUTH 2 (TWO) TIMES DAILY WITH A MEAL. KEEP APPT FOR REFILLS     cloNIDine 0.1 MG tablet  Commonly known as:  CATAPRES  Take 1 tablet (0.1 mg total) by mouth 2 (two) times daily.     colchicine 0.6 MG  tablet  Take 0.6-1.2 mg by mouth daily as needed. gout     diclofenac 75 MG EC tablet  Commonly known as:  VOLTAREN  Take 75 mg by mouth 2 (two) times daily.     Glucosamine-Chondroitin Caps  Take 1 capsule by mouth daily.     hydrochlorothiazide 12.5 MG capsule  Commonly known as:  MICROZIDE  TAKE 1 CAPSULE (12.5 MG TOTAL) BY MOUTH EVERY MORNING.     HYDROcodone-acetaminophen 5-325 MG tablet  Commonly known as:  NORCO/VICODIN  Take 1 tablet by mouth 2 (two) times daily. Pt takes one in the morning and one at bedtime. In between, may have one every 4 hours prn     lisinopril 40 MG tablet  Commonly known as:  PRINIVIL,ZESTRIL  TAKE 1 TABLET (40 MG TOTAL) BY MOUTH DAILY.     multivitamin with minerals Tabs tablet  Take 1 tablet by mouth daily.     potassium chloride 10 MEQ CR capsule  Commonly known as:  MICRO-K  Take 40 mEq by mouth once.      traMADol 50 MG tablet  Commonly known as:  ULTRAM  Take 50 mg by mouth every 6 (six) hours as needed.     traMADol 50 MG tablet  Commonly known as:  ULTRAM  Take 1 tablet (50 mg total) by mouth every 6 (six) hours as needed for moderate pain.     vancomycin 500 mg in sodium chloride 0.9 % 100 mL  Inject 500 mg into the vein every 12 (twelve) hours.           Follow-up Information    Follow up with Petrina Melby V, MD In 1 week.   Specialty:  Orthopedic Surgery   Contact information:   [place] [address] [phone]       Signed: Newt Minion 10/20/2015, 10:44 AM

## 2015-10-21 DIAGNOSIS — Z471 Aftercare following joint replacement surgery: Secondary | ICD-10-CM | POA: Diagnosis not present

## 2015-10-21 DIAGNOSIS — M25562 Pain in left knee: Secondary | ICD-10-CM | POA: Diagnosis not present

## 2015-10-21 DIAGNOSIS — E785 Hyperlipidemia, unspecified: Secondary | ICD-10-CM | POA: Diagnosis not present

## 2015-10-21 DIAGNOSIS — I10 Essential (primary) hypertension: Secondary | ICD-10-CM | POA: Diagnosis not present

## 2015-10-21 DIAGNOSIS — R2689 Other abnormalities of gait and mobility: Secondary | ICD-10-CM | POA: Diagnosis not present

## 2015-10-31 DIAGNOSIS — M25562 Pain in left knee: Secondary | ICD-10-CM | POA: Diagnosis not present

## 2015-10-31 DIAGNOSIS — R2689 Other abnormalities of gait and mobility: Secondary | ICD-10-CM | POA: Diagnosis not present

## 2015-11-01 DIAGNOSIS — S81002A Unspecified open wound, left knee, initial encounter: Secondary | ICD-10-CM | POA: Diagnosis not present

## 2015-11-07 ENCOUNTER — Other Ambulatory Visit: Payer: Self-pay | Admitting: *Deleted

## 2015-11-07 MED ORDER — CARVEDILOL 6.25 MG PO TABS: ORAL_TABLET | ORAL | Status: DC

## 2015-11-08 ENCOUNTER — Other Ambulatory Visit: Payer: Self-pay | Admitting: *Deleted

## 2015-11-08 MED ORDER — CARVEDILOL 6.25 MG PO TABS: ORAL_TABLET | ORAL | Status: DC

## 2015-11-13 ENCOUNTER — Other Ambulatory Visit: Payer: Self-pay | Admitting: Plastic Surgery

## 2015-11-13 DIAGNOSIS — S81002A Unspecified open wound, left knee, initial encounter: Secondary | ICD-10-CM

## 2015-11-15 ENCOUNTER — Encounter (HOSPITAL_COMMUNITY)
Admission: RE | Admit: 2015-11-15 | Discharge: 2015-11-15 | Disposition: A | Discharge status: Home / Self Care | Payer: Medicare Other | Source: Ambulatory Visit | Attending: Plastic Surgery | Admitting: Plastic Surgery

## 2015-11-15 ENCOUNTER — Encounter (HOSPITAL_COMMUNITY): Payer: Self-pay

## 2015-11-15 VITALS — BP 102/46 | HR 69 | Temp 97.5°F | Resp 20 | Ht 63.5 in | Wt 118.5 lb

## 2015-11-15 DIAGNOSIS — E785 Hyperlipidemia, unspecified: Secondary | ICD-10-CM | POA: Diagnosis not present

## 2015-11-15 DIAGNOSIS — I10 Essential (primary) hypertension: Secondary | ICD-10-CM | POA: Diagnosis not present

## 2015-11-15 DIAGNOSIS — I2510 Atherosclerotic heart disease of native coronary artery without angina pectoris: Secondary | ICD-10-CM | POA: Diagnosis not present

## 2015-11-15 DIAGNOSIS — I739 Peripheral vascular disease, unspecified: Secondary | ICD-10-CM | POA: Diagnosis not present

## 2015-11-15 DIAGNOSIS — T8189XA Other complications of procedures, not elsewhere classified, initial encounter: Secondary | ICD-10-CM | POA: Diagnosis not present

## 2015-11-15 DIAGNOSIS — J449 Chronic obstructive pulmonary disease, unspecified: Secondary | ICD-10-CM | POA: Diagnosis not present

## 2015-11-15 DIAGNOSIS — S81002A Unspecified open wound, left knee, initial encounter: Secondary | ICD-10-CM

## 2015-11-15 HISTORY — DX: Cardiac murmur, unspecified: R01.1

## 2015-11-15 HISTORY — DX: Personal history of other diseases of the respiratory system: Z87.09

## 2015-11-15 HISTORY — DX: Other amnesia: R41.3

## 2015-11-15 HISTORY — DX: Presence of spectacles and contact lenses: Z97.3

## 2015-11-15 LAB — CBC
HCT: 27.4 % — ABNORMAL LOW (ref 36.0–46.0)
Hemoglobin: 9.4 g/dL — ABNORMAL LOW (ref 12.0–15.0)
MCH: 31.8 pg (ref 26.0–34.0)
MCHC: 34.3 g/dL (ref 30.0–36.0)
MCV: 92.6 fL (ref 78.0–100.0)
PLATELETS: 402 10*3/uL — AB (ref 150–400)
RBC: 2.96 MIL/uL — ABNORMAL LOW (ref 3.87–5.11)
RDW: 15.3 % (ref 11.5–15.5)
WBC: 8.3 10*3/uL (ref 4.0–10.5)

## 2015-11-15 LAB — BASIC METABOLIC PANEL
ANION GAP: 12 (ref 5–15)
BUN: 9 mg/dL (ref 6–20)
CALCIUM: 9.4 mg/dL (ref 8.9–10.3)
CO2: 22 mmol/L (ref 22–32)
Chloride: 101 mmol/L (ref 101–111)
Creatinine, Ser: 1.03 mg/dL — ABNORMAL HIGH (ref 0.44–1.00)
GFR, EST NON AFRICAN AMERICAN: 52 mL/min — AB (ref >60)
GLUCOSE: 153 mg/dL — AB (ref 65–99)
POTASSIUM: 4.2 mmol/L (ref 3.5–5.1)
Sodium: 135 mmol/L (ref 135–145)

## 2015-11-15 LAB — SURGICAL PCR SCREEN
MRSA, PCR: NEGATIVE
Staphylococcus aureus: NEGATIVE

## 2015-11-15 MED ORDER — CEFAZOLIN SODIUM-DEXTROSE 2-4 GM/100ML-% IV SOLN
2.0000 g | INTRAVENOUS | Status: AC
  Administered 2015-11-16: 2 g via INTRAVENOUS
  Filled 2015-11-15: qty 100

## 2015-11-15 NOTE — Progress Notes (Signed)
PCP - Dr. Kirk Ruths Cardiologist - denies  EKG - 09/20/15 CXR - denies  Echo- denies Stress test - 07/2014 Cardiac Cath - 2007  Patient denies chest pain and shortness of breath at PAT appointment.  Patient's son states that patient received a block with previous surgery and recovered much faster, with less memory impairment.

## 2015-11-15 NOTE — Pre-Procedure Instructions (Signed)
    JOHNNIE MOTEN  11/15/2015      [id number] [id number]: [phone]: [phone] [place]Phone: [phone]: [phone]   Your procedure is scheduled on Wednesday, April 19th, 2017.  [facility]   Call this number if you have problems the morning of surgery:  [phone]   Remember:  Do not eat food or drink liquids after midnight.   Take these medicines the morning of surgery with A SIP OF WATER: Amlodipine (Norvasc), Carvedilol (Coreg), Clonidine (Catapres), Colchicine if needed, Hyrdrocodone-acetaminophen (Norco) if needed.  Stop taking: Aspirin, NSAIDS, Aleve, Naproxen, Ibuprofen, Advil, Motrin, BC's, Goody's, Fish oil, all herbal medications, and all vitamins.    Do not wear jewelry, make-up or nail polish.  Do not wear lotions, powders, or perfumes.   Do not shave 48 hours prior to surgery.    Do not bring valuables to the hospital.   [facility] or [facility].  Contacts, dentures or bridgework may not be worn into surgery.  Leave your suitcase in the car.  After surgery it may be brought to your room.  For patients admitted to the hospital, discharge time will be determined by your treatment team.  Patients discharged the day of surgery will not be allowed to drive home.   Special instructions:  See attached.   Please read over the following fact sheets that you were given. Pain Booklet, Coughing and Deep Breathing, MRSA Information and Surgical Site Infection Prevention

## 2015-11-15 NOTE — Progress Notes (Addendum)
Anesthesia Chart Review:  Pt is a 75 year old female scheduled for L gastroc muscle flap with skin graft split thickness and vac placement on 11/16/2015 with Dr. Marla Roe.   Pt had L TKA 09/28/15 that became infected. S/p irrigation and debridement 10/12/15 and s/p irrigation and debridement with excision skin, soft tissue, muscle and tendon 10/18/15.   Note pt had regional anesthesia for last procedure and son reported at PAT that pt recovered faster and with less memory impairment compared with previous procedures under GA.   Cardiologist is Dr. Kirk Ruths, last office visit 07/16/14.   PMH includes: CAD (DES to RCA and CX 2008), HTN, cerebrovascular disease, hyperlipidemia, PVD, DVT. Former smoker (quit recently). BMI 22. S/p R THA 09/10/12.   Medications include: amlodipine, ASA, lipitor, carvedilol, clonidine, hctz, lisinopril.   Preoperative labs reviewed.   Chest x-ray 09/20/15 reviewed. No active cardiopulmonary disease. Mild hyperinflation. Thoracic spine osteopenia.  EKG 09/20/15: NSR.   Nuclear stress test 08/10/14: Normal stress nuclear study. LV Wall Motion: NL LV Function, EF 68%; NL Wall Motion  Carotid duplex 06/15/15:  - Heterogeneous plaque, bilaterally. - Stable 1-39% RICA stenosis. - Stable 91-91% LICA stenosis. - >50% LECA stenosis. - Normal subclavian arteries, bilaterally. - Patent vertebral arteries with antegrade flow.  AA duplex 08/08/12:  - Normal caliber abdominal aorta, common and external iliac arteries, without dilatation - Moderate to severe aorto-iliac artherosclerosis, with moderate stenosis of the SMA - Moderate R common illiac artery stenosis - Severe L common iliac artery stenosis - Mild B external iliac artery stenosis  Pt tolerated multiple surgical procedures in last 1.5 months without issue. If no changes, I anticipate pt can proceed with surgery as scheduled.   Willeen Cass, FNP-BC [facility]/Anesthesiology Phone:  [phone] 11/15/2015 3:11 PM

## 2015-11-16 ENCOUNTER — Encounter (HOSPITAL_COMMUNITY): Payer: Self-pay | Admitting: Certified Registered Nurse Anesthetist

## 2015-11-16 ENCOUNTER — Inpatient Hospital Stay (HOSPITAL_COMMUNITY): Payer: Medicare Other | Admitting: Emergency Medicine

## 2015-11-16 ENCOUNTER — Observation Stay (HOSPITAL_COMMUNITY)
Admission: RE | Admit: 2015-11-16 | Discharge: 2015-11-17 | Disposition: A | Discharge status: Skilled Nursing Facility | Payer: Medicare Other | Source: Ambulatory Visit | Attending: Plastic Surgery | Admitting: Plastic Surgery

## 2015-11-16 ENCOUNTER — Inpatient Hospital Stay (HOSPITAL_COMMUNITY): Payer: Medicare Other | Admitting: Certified Registered Nurse Anesthetist

## 2015-11-16 ENCOUNTER — Encounter (HOSPITAL_COMMUNITY)
Admission: RE | Disposition: A | Discharge status: Skilled Nursing Facility | Payer: Self-pay | Source: Ambulatory Visit | Attending: Plastic Surgery

## 2015-11-16 DIAGNOSIS — Y831 Surgical operation with implant of artificial internal device as the cause of abnormal reaction of the patient, or of later complication, without mention of misadventure at the time of the procedure: Secondary | ICD-10-CM | POA: Insufficient documentation

## 2015-11-16 DIAGNOSIS — I739 Peripheral vascular disease, unspecified: Secondary | ICD-10-CM | POA: Insufficient documentation

## 2015-11-16 DIAGNOSIS — Z96641 Presence of right artificial hip joint: Secondary | ICD-10-CM | POA: Insufficient documentation

## 2015-11-16 DIAGNOSIS — S81009A Unspecified open wound, unspecified knee, initial encounter: Secondary | ICD-10-CM | POA: Diagnosis present

## 2015-11-16 DIAGNOSIS — Z96652 Presence of left artificial knee joint: Secondary | ICD-10-CM | POA: Insufficient documentation

## 2015-11-16 DIAGNOSIS — T8189XA Other complications of procedures, not elsewhere classified, initial encounter: Secondary | ICD-10-CM | POA: Diagnosis not present

## 2015-11-16 DIAGNOSIS — I2510 Atherosclerotic heart disease of native coronary artery without angina pectoris: Secondary | ICD-10-CM | POA: Diagnosis not present

## 2015-11-16 DIAGNOSIS — I10 Essential (primary) hypertension: Secondary | ICD-10-CM | POA: Diagnosis not present

## 2015-11-16 DIAGNOSIS — Z87891 Personal history of nicotine dependence: Secondary | ICD-10-CM | POA: Insufficient documentation

## 2015-11-16 DIAGNOSIS — Z79899 Other long term (current) drug therapy: Secondary | ICD-10-CM | POA: Insufficient documentation

## 2015-11-16 DIAGNOSIS — S81002A Unspecified open wound, left knee, initial encounter: Secondary | ICD-10-CM | POA: Diagnosis not present

## 2015-11-16 DIAGNOSIS — J449 Chronic obstructive pulmonary disease, unspecified: Secondary | ICD-10-CM | POA: Insufficient documentation

## 2015-11-16 DIAGNOSIS — Z955 Presence of coronary angioplasty implant and graft: Secondary | ICD-10-CM | POA: Insufficient documentation

## 2015-11-16 DIAGNOSIS — E785 Hyperlipidemia, unspecified: Secondary | ICD-10-CM | POA: Insufficient documentation

## 2015-11-16 DIAGNOSIS — Z791 Long term (current) use of non-steroidal anti-inflammatories (NSAID): Secondary | ICD-10-CM | POA: Insufficient documentation

## 2015-11-16 DIAGNOSIS — Z792 Long term (current) use of antibiotics: Secondary | ICD-10-CM | POA: Insufficient documentation

## 2015-11-16 DIAGNOSIS — Z86718 Personal history of other venous thrombosis and embolism: Secondary | ICD-10-CM | POA: Insufficient documentation

## 2015-11-16 DIAGNOSIS — Z7901 Long term (current) use of anticoagulants: Secondary | ICD-10-CM | POA: Insufficient documentation

## 2015-11-16 HISTORY — PX: APPLICATION OF WOUND VAC: SHX5189

## 2015-11-16 HISTORY — PX: FREE FLAP TO EXTREMITY: SHX5356

## 2015-11-16 HISTORY — PX: SKIN SPLIT GRAFT: SHX444

## 2015-11-16 SURGERY — APPLICATION, GRAFT, SKIN, SPLIT-THICKNESS
Anesthesia: Monitor Anesthesia Care | Site: Leg Lower | Laterality: Left

## 2015-11-16 MED ORDER — BUPIVACAINE-EPINEPHRINE (PF) 0.25% -1:200000 IJ SOLN
INTRAMUSCULAR | Status: AC
  Filled 2015-11-16: qty 30

## 2015-11-16 MED ORDER — LACTATED RINGERS IV SOLN
INTRAVENOUS | Status: DC
  Administered 2015-11-16 (×2): via INTRAVENOUS

## 2015-11-16 MED ORDER — HYDROMORPHONE HCL 1 MG/ML IJ SOLN
1.0000 mg | INTRAMUSCULAR | Status: DC | PRN
Start: 2015-11-16 — End: 2015-11-17

## 2015-11-16 MED ORDER — FENTANYL CITRATE (PF) 100 MCG/2ML IJ SOLN
INTRAMUSCULAR | Status: DC | PRN
  Administered 2015-11-16 (×3): 25 ug via INTRAVENOUS
  Administered 2015-11-16: 50 ug via INTRAVENOUS
  Administered 2015-11-16: 25 ug via INTRAVENOUS

## 2015-11-16 MED ORDER — LIDOCAINE-EPINEPHRINE 1 %-1:100000 IJ SOLN
INTRAMUSCULAR | Status: AC
  Filled 2015-11-16: qty 1

## 2015-11-16 MED ORDER — DIPHENHYDRAMINE HCL 12.5 MG/5ML PO ELIX: 12.5000 mg | ORAL_SOLUTION | Freq: Four times a day (QID) | ORAL | Status: DC | PRN

## 2015-11-16 MED ORDER — SODIUM CHLORIDE 0.9 % IR SOLN
Status: DC | PRN
  Administered 2015-11-16: 500 mL

## 2015-11-16 MED ORDER — BUPIVACAINE-EPINEPHRINE (PF) 0.5% -1:200000 IJ SOLN
INTRAMUSCULAR | Status: DC | PRN
  Administered 2015-11-16: 12.5 mL via PERINEURAL
  Administered 2015-11-16: 10 mL via PERINEURAL

## 2015-11-16 MED ORDER — DIPHENHYDRAMINE HCL 50 MG/ML IJ SOLN: 12.5000 mg | Freq: Four times a day (QID) | INTRAMUSCULAR | Status: DC | PRN

## 2015-11-16 MED ORDER — HYDROMORPHONE HCL 1 MG/ML IJ SOLN
0.2500 mg | INTRAMUSCULAR | Status: DC | PRN
Start: 2015-11-16 — End: 2015-11-16

## 2015-11-16 MED ORDER — MAGNESIUM CITRATE PO SOLN: 1.0000 | Freq: Once | ORAL | Status: DC | PRN

## 2015-11-16 MED ORDER — SODIUM CHLORIDE 0.9 % IR SOLN
Status: DC
  Filled 2015-11-16: qty 1

## 2015-11-16 MED ORDER — FENTANYL CITRATE (PF) 250 MCG/5ML IJ SOLN
INTRAMUSCULAR | Status: AC
  Filled 2015-11-16: qty 5

## 2015-11-16 MED ORDER — PROPOFOL 500 MG/50ML IV EMUL
INTRAVENOUS | Status: DC | PRN
  Administered 2015-11-16: 75 ug/kg/min via INTRAVENOUS

## 2015-11-16 MED ORDER — ACETAMINOPHEN 500 MG PO TABS
1000.0000 mg | ORAL_TABLET | Freq: Four times a day (QID) | ORAL | Status: DC
  Administered 2015-11-16 – 2015-11-17 (×4): 1000 mg via ORAL
  Filled 2015-11-16 (×4): qty 2

## 2015-11-16 MED ORDER — ROCURONIUM BROMIDE 50 MG/5ML IV SOLN
INTRAVENOUS | Status: AC
  Filled 2015-11-16: qty 1

## 2015-11-16 MED ORDER — LIDOCAINE-EPINEPHRINE 1 %-1:100000 IJ SOLN
INTRAMUSCULAR | Status: DC | PRN
  Administered 2015-11-16: 20 mL

## 2015-11-16 MED ORDER — SUCCINYLCHOLINE CHLORIDE 20 MG/ML IJ SOLN
INTRAMUSCULAR | Status: AC
  Filled 2015-11-16: qty 1

## 2015-11-16 MED ORDER — PROPOFOL 10 MG/ML IV BOLUS
INTRAVENOUS | Status: AC
  Filled 2015-11-16: qty 20

## 2015-11-16 MED ORDER — LIDOCAINE HCL (CARDIAC) 20 MG/ML IV SOLN
INTRAVENOUS | Status: DC | PRN
  Administered 2015-11-16 (×2): 50 mg via INTRAVENOUS

## 2015-11-16 MED ORDER — OXYCODONE-ACETAMINOPHEN 5-325 MG PO TABS: 1.0000 | ORAL_TABLET | ORAL | Status: DC | PRN

## 2015-11-16 MED ORDER — MEPIVACAINE HCL 1.5 % IJ SOLN
INTRAMUSCULAR | Status: DC | PRN
  Administered 2015-11-16: 12.5 mL via PERINEURAL
  Administered 2015-11-16: 10 mL via PERINEURAL

## 2015-11-16 MED ORDER — PROPOFOL 1000 MG/100ML IV EMUL
INTRAVENOUS | Status: AC
  Filled 2015-11-16: qty 100

## 2015-11-16 MED ORDER — FENTANYL CITRATE (PF) 100 MCG/2ML IJ SOLN
50.0000 ug | Freq: Once | INTRAMUSCULAR | Status: AC
  Administered 2015-11-16: 25 ug via INTRAVENOUS

## 2015-11-16 MED ORDER — EPINEPHRINE HCL 1 MG/ML IJ SOLN
INTRAMUSCULAR | Status: AC
  Filled 2015-11-16: qty 1

## 2015-11-16 MED ORDER — BUPIVACAINE-EPINEPHRINE 0.25% -1:200000 IJ SOLN
INTRAMUSCULAR | Status: DC | PRN
  Administered 2015-11-16: 30 mL

## 2015-11-16 MED ORDER — FENTANYL CITRATE (PF) 100 MCG/2ML IJ SOLN
INTRAMUSCULAR | Status: AC
  Filled 2015-11-16: qty 2

## 2015-11-16 MED ORDER — MIDAZOLAM HCL 2 MG/2ML IJ SOLN
INTRAMUSCULAR | Status: AC
  Filled 2015-11-16: qty 2

## 2015-11-16 MED ORDER — ONDANSETRON 4 MG PO TBDP: 4.0000 mg | ORAL_TABLET | Freq: Four times a day (QID) | ORAL | Status: DC | PRN

## 2015-11-16 MED ORDER — ONDANSETRON HCL 4 MG/2ML IJ SOLN: 4.0000 mg | Freq: Four times a day (QID) | INTRAMUSCULAR | Status: DC | PRN

## 2015-11-16 MED ORDER — KCL IN DEXTROSE-NACL 20-5-0.45 MEQ/L-%-% IV SOLN
INTRAVENOUS | Status: DC
  Administered 2015-11-16: 17:00:00 via INTRAVENOUS
  Filled 2015-11-16 (×2): qty 1000

## 2015-11-16 MED ORDER — LIDOCAINE HCL (CARDIAC) 20 MG/ML IV SOLN
INTRAVENOUS | Status: AC
  Filled 2015-11-16: qty 5

## 2015-11-16 MED ORDER — NAPROXEN 500 MG PO TABS
500.0000 mg | ORAL_TABLET | Freq: Two times a day (BID) | ORAL | Status: DC | PRN
  Filled 2015-11-16: qty 1

## 2015-11-16 MED ORDER — 0.9 % SODIUM CHLORIDE (POUR BTL) OPTIME
TOPICAL | Status: DC | PRN
  Administered 2015-11-16: 1000 mL

## 2015-11-16 MED ORDER — MIDAZOLAM HCL 2 MG/2ML IJ SOLN: 1.0000 mg | Freq: Once | INTRAMUSCULAR | Status: DC

## 2015-11-16 MED ORDER — POLYETHYLENE GLYCOL 3350 17 G PO PACK
17.0000 g | PACK | Freq: Every day | ORAL | Status: DC
  Administered 2015-11-16 – 2015-11-17 (×2): 17 g via ORAL
  Filled 2015-11-16 (×2): qty 1

## 2015-11-16 SURGICAL SUPPLY — 62 items
ADH SKN CLS APL DERMABOND .7 (GAUZE/BANDAGES/DRESSINGS) ×1
BANDAGE ELASTIC 4 VELCRO ST LF (GAUZE/BANDAGES/DRESSINGS) IMPLANT
BANDAGE ELASTIC 6 VELCRO ST LF (GAUZE/BANDAGES/DRESSINGS) IMPLANT
BLADE 10 SAFETY STRL DISP (BLADE) ×3 IMPLANT
BLADE DERMATOME II (BLADE) ×3 IMPLANT
BLADE DERMATONE (BLADE) ×3 IMPLANT
CANISTER SUCTION 2500CC (MISCELLANEOUS) ×4 IMPLANT
CANISTER WOUND CARE 500ML ATS (WOUND CARE) ×2 IMPLANT
CONNECTOR 5 IN 1 STRAIGHT STRL (MISCELLANEOUS) ×2 IMPLANT
COVER SURGICAL LIGHT HANDLE (MISCELLANEOUS) ×3 IMPLANT
DERMABOND ADVANCED (GAUZE/BANDAGES/DRESSINGS) ×2
DERMABOND ADVANCED .7 DNX12 (GAUZE/BANDAGES/DRESSINGS) IMPLANT
DERMACARRIERS GRAFT 1 TO 1.5 (DISPOSABLE) ×3
DRAIN CHANNEL 19F RND (DRAIN) ×2 IMPLANT
DRAPE INCISE IOBAN 66X45 STRL (DRAPES) ×3 IMPLANT
DRAPE ORTHO SPLIT 77X108 STRL (DRAPES) ×6
DRAPE PROXIMA HALF (DRAPES) ×3 IMPLANT
DRAPE SURG ORHT 6 SPLT 77X108 (DRAPES) ×2 IMPLANT
DRSG CUTIMED SORBACT 7X9 (GAUZE/BANDAGES/DRESSINGS) ×2 IMPLANT
DRSG MEPILEX BORDER 4X12 (GAUZE/BANDAGES/DRESSINGS) ×2 IMPLANT
DRSG PAD ABDOMINAL 8X10 ST (GAUZE/BANDAGES/DRESSINGS) ×1 IMPLANT
DRSG VAC ATS MED SENSATRAC (GAUZE/BANDAGES/DRESSINGS) ×2 IMPLANT
ELECT BLADE 4.0 EZ CLEAN MEGAD (MISCELLANEOUS) ×3
ELECT REM PT RETURN 9FT ADLT (ELECTROSURGICAL) ×3
ELECTRODE BLDE 4.0 EZ CLN MEGD (MISCELLANEOUS) IMPLANT
ELECTRODE REM PT RTRN 9FT ADLT (ELECTROSURGICAL) IMPLANT
EVACUATOR SILICONE 100CC (DRAIN) ×2 IMPLANT
GAUZE SPONGE 4X4 12PLY STRL (GAUZE/BANDAGES/DRESSINGS) ×3 IMPLANT
GAUZE XEROFORM 5X9 LF (GAUZE/BANDAGES/DRESSINGS) ×3 IMPLANT
GEL ULTRASOUND 20GR AQUASONIC (MISCELLANEOUS) IMPLANT
GLOVE BIO SURGEON STRL SZ 6.5 (GLOVE) ×6 IMPLANT
GLOVE BIO SURGEONS STRL SZ 6.5 (GLOVE) ×4
GOWN STRL REUS W/ TWL LRG LVL3 (GOWN DISPOSABLE) ×2 IMPLANT
GOWN STRL REUS W/TWL LRG LVL3 (GOWN DISPOSABLE) ×6
GRAFT DERMACARRIERS 1 TO 1.5 (DISPOSABLE) IMPLANT
IMMOBILIZER KNEE 20 (SOFTGOODS) ×3
IMMOBILIZER KNEE 20 THIGH 36 (SOFTGOODS) IMPLANT
KIT BASIN OR (CUSTOM PROCEDURE TRAY) ×3 IMPLANT
KIT ROOM TURNOVER OR (KITS) ×3 IMPLANT
MATRIX SURGICAL PSM 5X5CM (Tissue) ×2 IMPLANT
MICROMATRIX 500MG (Tissue) ×3 IMPLANT
NS IRRIG 1000ML POUR BTL (IV SOLUTION) ×3 IMPLANT
PACK GENERAL/GYN (CUSTOM PROCEDURE TRAY) ×3 IMPLANT
PAD ARMBOARD 7.5X6 YLW CONV (MISCELLANEOUS) ×6 IMPLANT
SOLUTION PARTIC MCRMTRX 500MG (Tissue) IMPLANT
SPONGE LAP 18X18 X RAY DECT (DISPOSABLE) ×2 IMPLANT
SUT CHROMIC 4 0 PS 2 18 (SUTURE) IMPLANT
SUT ETHILON 2 0 FS 18 (SUTURE) ×2 IMPLANT
SUT MNCRL AB 3-0 PS2 18 (SUTURE) ×4 IMPLANT
SUT MNCRL AB 4-0 PS2 18 (SUTURE) ×2 IMPLANT
SUT MON AB 5-0 PS2 18 (SUTURE) ×2 IMPLANT
SUT SILK 2 0 SH (SUTURE) ×2 IMPLANT
SUT VIC AB 4-0 SH 27 (SUTURE) ×3
SUT VIC AB 4-0 SH 27XBRD (SUTURE) IMPLANT
SUT VIC AB 5-0 PS2 18 (SUTURE) ×14 IMPLANT
SYR 3ML 25GX5/8 SAFETY (SYRINGE) ×3 IMPLANT
SYR CONTROL 10ML LL (SYRINGE) ×3 IMPLANT
TOWEL OR 17X24 6PK STRL BLUE (TOWEL DISPOSABLE) ×3 IMPLANT
TOWEL OR 17X26 10 PK STRL BLUE (TOWEL DISPOSABLE) ×3 IMPLANT
TUBE CONNECTING 12'X1/4 (SUCTIONS) ×1
TUBE CONNECTING 12X1/4 (SUCTIONS) ×1 IMPLANT
UNDERPAD 30X30 INCONTINENT (UNDERPADS AND DIAPERS) ×3 IMPLANT

## 2015-11-16 NOTE — H&P (Signed)
[name] is an 75 y.o. female.   Chief Complaint: left knee wound HPI: The patient is a 75 yrs old wf here for treatment of left knee wound. She underwent a left knee replacement in March. It got infected and opened up ~ 2 times. She now has a 4 x 5 cm area of open skin to fascia. The implant appears to be covered. The open area is over the knee. The proximal and distal incision are intact but not well healed yet. She is wearing a brace and doing wet to dry dressing at the nursing facility. She has multiple medical conditions but they are well managed. She is on IV antibiotics for the infection. There is concern about the joint if the area is left open. She wants to be able to walk and care for herself.   Past Medical History  Diagnosis Date  . CAD (coronary artery disease)   . HTN (hypertension)   . Hyperlipidemia   . Cerebrovascular disease   . Arthritis   . PVD (peripheral vascular disease) (HCC)     dvt in leg  . MRSA (methicillin resistant staph aureus) culture positive   . Wears glasses   . Heart murmur   . History of bronchitis   . Memory impairment     Past Surgical History  Procedure Laterality Date  . Knee surgery      denies  . Abdominal hysterectomy    . Total hip arthroplasty Right 09/10/2012    Dr Lajoyce Corners  . Total hip arthroplasty Right 09/10/2012    Procedure: TOTAL HIP ARTHROPLASTY;  Surgeon: Nadara Mustard, MD;  Location: [facility] OR;  Service: Orthopedics;  Laterality: Right;  Right Total Hip Arthroplasty  . Coronary angioplasty  07    3 stents placed  . Total knee arthroplasty Left 09/28/2015    Procedure: TOTAL KNEE ARTHROPLASTY;  Surgeon: Nadara Mustard, MD;  Location: [facility] OR;  Service: Orthopedics;  Laterality: Left;  . I&d knee with poly exchange Left 10/12/2015    Procedure: Irrigation and Debridement , Poly Exchange, Antibiotic Beads Left Knee;  Surgeon: Nadara Mustard, MD;  Location: [facility] OR;  Service: Orthopedics;  Laterality: Left;  . I&d extremity Left 10/18/2015     Procedure: Left Knee Washout, Reclosure;  Surgeon: Nadara Mustard, MD;  Location: [facility] OR;  Service: Orthopedics;  Laterality: Left;  [name] Eye surgery Bilateral     cataracts  . Colonoscopy    . Esophagogastroduodenoscopy      Family History  Problem Relation Age of Onset  . Breast cancer Mother   . Leukemia Father   . Stroke Sister   . Bone cancer     Social History:  reports that she has quit smoking. Her smoking use included Cigarettes. She has a 25 pack-year smoking history. She has never used smokeless tobacco. She reports that she does not drink alcohol or use illicit drugs.  Allergies: No Known Allergies  No prescriptions prior to admission    Results for orders placed or performed during the hospital encounter of 11/15/15 (from the past 48 hour(s))  Surgical pcr screen     Status: None   Collection Time: 11/15/15  2:35 PM  Result Value Ref Range   MRSA, PCR NEGATIVE NEGATIVE   Staphylococcus aureus NEGATIVE NEGATIVE    Comment:        The Xpert SA Assay (FDA approved for NASAL specimens in patients over 51 years of age), is one component of a comprehensive surveillance  program.  Test performance has been validated by [facility] for patients greater than or equal to 86 year old. It is not intended to diagnose infection nor to guide or monitor treatment.   CBC     Status: Abnormal   Collection Time: 11/15/15  2:35 PM  Result Value Ref Range   WBC 8.3 4.0 - 10.5 K/uL   RBC 2.96 (L) 3.87 - 5.11 MIL/uL   Hemoglobin 9.4 (L) 12.0 - 15.0 g/dL   HCT 27.4 (L) 36.0 - 46.0 %   MCV 92.6 78.0 - 100.0 fL   MCH 31.8 26.0 - 34.0 pg   MCHC 34.3 30.0 - 36.0 g/dL   RDW 15.3 11.5 - 15.5 %   Platelets 402 (H) 150 - 400 K/uL  Basic metabolic panel     Status: Abnormal   Collection Time: 11/15/15  2:35 PM  Result Value Ref Range   Sodium 135 135 - 145 mmol/L   Potassium 4.2 3.5 - 5.1 mmol/L   Chloride 101 101 - 111 mmol/L   CO2 22 22 - 32 mmol/L   Glucose, Bld 153 (H) 65 -  99 mg/dL   BUN 9 6 - 20 mg/dL   Creatinine, Ser 1.03 (H) 0.44 - 1.00 mg/dL   Calcium 9.4 8.9 - 10.3 mg/dL   GFR calc non Af Amer 52 (L) >60 mL/min   GFR calc Af Amer >60 >60 mL/min    Comment: (NOTE) The eGFR has been calculated using the CKD EPI equation. This calculation has not been validated in all clinical situations. eGFR's persistently <60 mL/min signify possible Chronic Kidney Disease.    Anion gap 12 5 - 15   No results found.  Review of Systems  Constitutional: Negative.   HENT: Negative.   Eyes: Negative.   Respiratory: Negative.   Cardiovascular: Negative.   Gastrointestinal: Negative.   Genitourinary: Negative.   Musculoskeletal: Negative.   Skin: Negative.   Neurological: Negative.   Psychiatric/Behavioral: Negative.     There were no vitals taken for this visit. Physical Exam  Constitutional: She is oriented to person, place, and time. She appears well-developed and well-nourished.  HENT:  Head: Normocephalic and atraumatic.  Eyes: Conjunctivae and EOM are normal. Pupils are equal, round, and reactive to light.  Cardiovascular: Normal rate.   Respiratory: Effort normal.  GI: Soft.  Neurological: She is alert and oriented to person, place, and time.  Skin: Skin is warm.  Psychiatric: She has a normal mood and affect. Her behavior is normal. Judgment and thought content normal.     Assessment/Plan Plan for gastrocnemius muscle flap to left knee with possible Acell and VAC placement to left knee.  Wallace Going, DO 11/16/2015, 8:13 AM

## 2015-11-16 NOTE — Transfer of Care (Signed)
Immediate Anesthesia Transfer of Care Note  Patient: [name]  Procedure(s) Performed: Procedure(s): LEFT GASTROC MUSCLE FLAP WITH SKIN GRAFT SPLIT THICKNESS AND VAC PLACEMENT (Left) APPLICATION OF WOUND VAC (Left) FREE FLAP TO EXTREMITY (Left)  Patient Location: PACU  Anesthesia Type:MAC and Regional  Level of Consciousness: awake, patient cooperative and responds to stimulation  Airway & Oxygen Therapy: Patient Spontanous Breathing  Post-op Assessment: Report given to RN and Post -op Vital signs reviewed and stable  Post vital signs: Reviewed and stable  Last Vitals:  Filed Vitals:   11/16/15 0957  BP: 129/48  Pulse: 73  Temp: 36.5 C  Resp: 18    Complications: No apparent anesthesia complications

## 2015-11-16 NOTE — Brief Op Note (Signed)
11/16/2015  2:15 PM  PATIENT:  [name]  75 y.o. female  PRE-OPERATIVE DIAGNOSIS:  open left knee wound  POST-OPERATIVE DIAGNOSIS:  open left knee wound  PROCEDURE:  Procedure(s): LEFT GASTROC MUSCLE FLAP WITH SKIN GRAFT SPLIT THICKNESS AND VAC PLACEMENT (Left) APPLICATION OF WOUND VAC (Left) FREE FLAP TO EXTREMITY (Left)  SURGEON:  Surgeon(s) and Role:    * Claire S [name], DO - Primary  PHYSICIAN ASSISTANT: Shawn Rayburn, PA  ASSISTANTS: none   ANESTHESIA:   general  EBL:  Total I/O In: 800 [I.V.:800] Out: -   BLOOD ADMINISTERED:none  DRAINS: (1) Jackson-Pratt drain(s) with closed bulb suction in the left posterior leg pocket   LOCAL MEDICATIONS USED:  LIDOCAINE   SPECIMEN:  No Specimen  DISPOSITION OF SPECIMEN:  N/A  COUNTS:  YES  TOURNIQUET:  * No tourniquets in log *  DICTATION: .Dragon Dictation  PLAN OF CARE: Admit for overnight observation  PATIENT DISPOSITION:  PACU - hemodynamically stable.   Delay start of Pharmacological VTE agent (>24hrs) due to surgical blood loss or risk of bleeding: no

## 2015-11-16 NOTE — Op Note (Addendum)
Operative Note   DATE OF OPERATION: 11/16/2015  LOCATION: [facility]  SURGICAL DIVISION: Plastic Surgery  PREOPERATIVE DIAGNOSES:  Left knee wound  POSTOPERATIVE DIAGNOSES:  same  PROCEDURE: Lateral gastrocnemius muscle flap to the left knee with 6 x 7 cm Split thickness skin graft and VAC placement, ACell (powder 500 mg and 5 x 5 cm sheet) placement to the donor site.  SURGEON: Briggett Tuccillo Sanger Deina Lipsey, DO  ASSISTANT: Shawn Rayburn, PA  ANESTHESIA:  General.   COMPLICATIONS: None.   INDICATIONS FOR PROCEDURE:  The patient, [name] is a 75 y.o. female born on 07-08-1941, is here for treatment of a left knee wound after complications of a left knee replacement surgery and wound breakdown.  She is being treated with antibiotics.  The decision was made to gain coverage of the joint. MRN: [id number]  CONSENT:  Informed consent was obtained directly from the patient. Risks, benefits and alternatives were fully discussed. Specific risks including but not limited to bleeding, infection, hematoma, seroma, scarring, pain, infection, contracture, asymmetry, wound healing problems, and need for further surgery were all discussed. The patient did have an ample opportunity to have questions answered to satisfaction.   DESCRIPTION OF PROCEDURE:  The patient was taken to the operating room. SCD was placed on the right leg and IV antibiotics were given. The patient's operative site was prepped and draped in a sterile fashion. A time out was performed and all information was confirmed to be correct.  General anesthesia was administered.  The patient was placed in the right lateral position with padding and secured to the table.  An incision was made with a #10 blade approximately 2 cm behind the posterior border of the fibula. The bovie was used to dissect through the fat.  The deep fascia was identified, incised and deflected posteriorly.  Blunt dissection was done to separate  the lateral head of the gastrocnemius muscle from the soleus muscle. The plantaris tendon was seen and preserved.  The sural nerve was also seen on the gastrocnemius muscle between the two heads and preserved.  The lateral sural nerve was also preserved.  The space between the medial and lateral head of the muscle was identifies and separated with a pair of scissors.  The distal tendon was cut and the lateral belly of the muscle raised from distal to proximal to cover the knee.    The area of the knee was debrided and irrigated with antibiotic solution.  The gloves were changed.  The muscle was placed at the defect of the knee.  The 3-0 and 4-0 Vicryl was utilized to secure the muscle to the area around the knee and tuck it slightly below the level of the skin.  There was excellent color and blood flow.  Local was injected at the lateral left thigh.  The dermatome was set at 06/999 inch and used to obtain a split thickness skin graft of the lateral left thigh.  The skin graft was meshed at 1:1.5 and secured to the muscle with 5-0 Vicryl.  The sorbact was secured over the graft and a VAC sponge.  There was an excellent seal to the VAC at 125 mmHg.  The lateral leg was closed in layers with 3-0 Vicryl on the fascia and deep layers.  A #19 JP drain was placed and secured to the skin with 4-0 Silk suture.  The 4-0 and 5-0 Monocryl was used to close the remaining layers and skin. dermabond was applied and a  steri silicone border dressing.  The Acell 5 x 5 sheet and powder 500 mg was placed on the donor site and secured with 5-0 Vicryl and covered with sorbact and KY gel.  ABDs were applied and secured to the donor site with tape.  The leg was placed in a knee immobilizer.  The patient tolerated the procedure well.  There were no complications. The patient was allowed to wake from anesthesia, extubated and taken to the recovery room in satisfactory condition.

## 2015-11-16 NOTE — Anesthesia Preprocedure Evaluation (Addendum)
Anesthesia Evaluation  Patient identified by MRN, date of birth, ID band Patient awake    Reviewed: Allergy & Precautions, H&P , NPO status , Patient's Chart, lab work & pertinent test results, reviewed documented beta blocker date and time   Airway Mallampati: II  TM Distance: >3 FB Neck ROM: Full    Dental no notable dental hx. (+) Upper Dentures, Lower Dentures, Dental Advisory Given   Pulmonary neg pulmonary ROS, COPD, Current Smoker, former smoker,    Pulmonary exam normal breath sounds clear to auscultation       Cardiovascular hypertension, Pt. on medications and Pt. on home beta blockers + CAD, + Cardiac Stents and + Peripheral Vascular Disease   Rhythm:Regular Rate:Normal     Neuro/Psych negative neurological ROS  negative psych ROS   GI/Hepatic negative GI ROS, Neg liver ROS,   Endo/Other  negative endocrine ROS  Renal/GU negative Renal ROS  negative genitourinary   Musculoskeletal  (+) Arthritis , Osteoarthritis,    Abdominal   Peds  Hematology negative hematology ROS (+)   Anesthesia Other Findings   Reproductive/Obstetrics negative OB ROS                            Lab Results  Component Value Date   WBC 8.3 11/15/2015   HGB 9.4* 11/15/2015   HCT 27.4* 11/15/2015   MCV 92.6 11/15/2015   PLT 402* 11/15/2015   Lab Results  Component Value Date   CREATININE 1.03* 11/15/2015   BUN 9 11/15/2015   NA 135 11/15/2015   K 4.2 11/15/2015   CL 101 11/15/2015   CO2 22 11/15/2015    Anesthesia Physical  Anesthesia Plan  ASA: III  Anesthesia Plan: MAC and Regional   Post-op Pain Management:    Induction: Intravenous  Airway Management Planned: Simple Face Mask and Natural Airway  Additional Equipment:   Intra-op Plan:   Post-operative Plan:   Informed Consent: I have reviewed the patients History and Physical, chart, labs and discussed the procedure including  the risks, benefits and alternatives for the proposed anesthesia with the patient or authorized representative who has indicated his/her understanding and acceptance.     Plan Discussed with: CRNA  Anesthesia Plan Comments: (Pt and family requested procedure be done with block and sedation due to unwanted side effects with previous GA's. Discussed risks and benefits including the need to convert to GA, bleeding, infection and nerve damage. )       Anesthesia Quick Evaluation

## 2015-11-16 NOTE — Discharge Instructions (Signed)
Do not bend the left knee.  Keep splint in place. KY gel to the left thigh daily. Keep VAC attached and do not connect.

## 2015-11-16 NOTE — Anesthesia Postprocedure Evaluation (Signed)
Anesthesia Post Note  Patient: [name]  Procedure(s) Performed: Procedure(s) (LRB): LEFT GASTROC MUSCLE FLAP WITH SKIN GRAFT SPLIT THICKNESS AND VAC PLACEMENT (Left) APPLICATION OF WOUND VAC (Left) FREE FLAP TO EXTREMITY (Left)  Patient location during evaluation: PACU Anesthesia Type: MAC and Regional Level of consciousness: awake and alert Pain management: pain level controlled Vital Signs Assessment: post-procedure vital signs reviewed and stable Respiratory status: spontaneous breathing, nonlabored ventilation and respiratory function stable Cardiovascular status: stable and blood pressure returned to baseline Anesthetic complications: no    Last Vitals:  Filed Vitals:   11/16/15 1520 11/16/15 1545  BP:  107/95  Pulse:  68  Temp: 36.5 C 36.5 C  Resp:  15    Last Pain: There were no vitals filed for this visit.               Tiajuana Amass

## 2015-11-16 NOTE — Interval H&P Note (Signed)
History and Physical Interval Note:  11/16/2015 11:39 AM  [name]  has presented today for surgery, with the diagnosis of open left knee wound  The various methods of treatment have been discussed with the patient and family. After consideration of risks, benefits and other options for treatment, the patient has consented to  Procedure(s): LEFT GASTROC MUSCLE FLAP WITH SKIN GRAFT SPLIT THICKNESS AND VAC PLACEMENT (Left) APPLICATION OF WOUND VAC (Left) FREE FLAP TO EXTREMITY (Left) as a surgical intervention .  The patient's history has been reviewed, patient examined, no change in status, stable for surgery.  I have reviewed the patient's chart and labs.  Questions were answered to the patient's satisfaction.     Wallace Going

## 2015-11-16 NOTE — Anesthesia Procedure Notes (Addendum)
Anesthesia Regional Block:  Femoral nerve block  Pre-Anesthetic Checklist: ,, timeout performed, Correct Patient, Correct Site, Correct Laterality, Correct Procedure, Correct Position, site marked, Risks and benefits discussed,  Surgical consent,  Pre-op evaluation,  At surgeon's request and post-op pain management  Laterality: Left  Prep: chloraprep       Needles:  Injection technique: Single-shot  Needle Type: Echogenic Stimulator Needle     Needle Length: 9cm 9 cm Needle Gauge: 21 and 21 G    Additional Needles:  Procedures: ultrasound guided (picture in chart) and nerve stimulator Femoral nerve block  Nerve Stimulator or Paresthesia:  Response: quadraceps contraction, 0.45 mA,   Additional Responses:   Narrative:  Start time: 11/16/2015 11:07 AM End time: 11/16/2015 11:15 AM Injection made incrementally with aspirations every 5 mL.  Performed by: Personally  Anesthesiologist: Suzette Battiest   Anesthesia Regional Block:  Popliteal block  Pre-Anesthetic Checklist: ,, timeout performed, Correct Patient, Correct Site, Correct Laterality, Correct Procedure, Correct Position, site marked, Risks and benefits discussed,  Surgical consent,  Pre-op evaluation,  At surgeon's request and post-op pain management  Laterality: Left  Prep: chloraprep       Needles:  Injection technique: Single-shot  Needle Type: Echogenic Stimulator Needle     Needle Length: 10cm 10 cm Needle Gauge: 21 and 21 G    Additional Needles:  Procedures: ultrasound guided (picture in chart) and nerve stimulator Popliteal block  Nerve Stimulator or Paresthesia:  Response: tibial, 0.5 mA,   Additional Responses:   Narrative:  Start time: 11/16/2015 11:17 AM End time: 11/16/2015 11:27 AM Injection made incrementally with aspirations every 5 mL.  Events: injection painful  Performed by: Personally  Anesthesiologist: Suzette Battiest

## 2015-11-17 ENCOUNTER — Encounter (HOSPITAL_COMMUNITY): Payer: Self-pay | Admitting: Plastic Surgery

## 2015-11-17 DIAGNOSIS — E785 Hyperlipidemia, unspecified: Secondary | ICD-10-CM | POA: Diagnosis not present

## 2015-11-17 DIAGNOSIS — M25562 Pain in left knee: Secondary | ICD-10-CM | POA: Diagnosis not present

## 2015-11-17 DIAGNOSIS — I723 Aneurysm of iliac artery: Secondary | ICD-10-CM | POA: Diagnosis not present

## 2015-11-17 DIAGNOSIS — T8454XD Infection and inflammatory reaction due to internal left knee prosthesis, subsequent encounter: Secondary | ICD-10-CM | POA: Diagnosis not present

## 2015-11-17 DIAGNOSIS — I739 Peripheral vascular disease, unspecified: Secondary | ICD-10-CM | POA: Diagnosis not present

## 2015-11-17 DIAGNOSIS — I2510 Atherosclerotic heart disease of native coronary artery without angina pectoris: Secondary | ICD-10-CM | POA: Diagnosis not present

## 2015-11-17 DIAGNOSIS — I10 Essential (primary) hypertension: Secondary | ICD-10-CM | POA: Diagnosis not present

## 2015-11-17 DIAGNOSIS — M1990 Unspecified osteoarthritis, unspecified site: Secondary | ICD-10-CM | POA: Diagnosis not present

## 2015-11-17 DIAGNOSIS — J449 Chronic obstructive pulmonary disease, unspecified: Secondary | ICD-10-CM | POA: Diagnosis not present

## 2015-11-17 DIAGNOSIS — R0989 Other specified symptoms and signs involving the circulatory and respiratory systems: Secondary | ICD-10-CM | POA: Diagnosis not present

## 2015-11-17 DIAGNOSIS — M1732 Unilateral post-traumatic osteoarthritis, left knee: Secondary | ICD-10-CM | POA: Diagnosis not present

## 2015-11-17 DIAGNOSIS — M00062 Staphylococcal arthritis, left knee: Secondary | ICD-10-CM | POA: Diagnosis not present

## 2015-11-17 DIAGNOSIS — I714 Abdominal aortic aneurysm, without rupture: Secondary | ICD-10-CM | POA: Diagnosis not present

## 2015-11-17 DIAGNOSIS — T8189XA Other complications of procedures, not elsewhere classified, initial encounter: Secondary | ICD-10-CM | POA: Diagnosis not present

## 2015-11-17 DIAGNOSIS — S81839A Puncture wound without foreign body, unspecified lower leg, initial encounter: Secondary | ICD-10-CM | POA: Diagnosis not present

## 2015-11-17 DIAGNOSIS — Z96652 Presence of left artificial knee joint: Secondary | ICD-10-CM | POA: Diagnosis not present

## 2015-11-17 DIAGNOSIS — Z8673 Personal history of transient ischemic attack (TIA), and cerebral infarction without residual deficits: Secondary | ICD-10-CM | POA: Diagnosis not present

## 2015-11-17 DIAGNOSIS — Z87891 Personal history of nicotine dependence: Secondary | ICD-10-CM | POA: Diagnosis not present

## 2015-11-17 DIAGNOSIS — M79609 Pain in unspecified limb: Secondary | ICD-10-CM | POA: Diagnosis not present

## 2015-11-17 DIAGNOSIS — S81002D Unspecified open wound, left knee, subsequent encounter: Secondary | ICD-10-CM | POA: Diagnosis not present

## 2015-11-17 MED ORDER — ENOXAPARIN SODIUM 30 MG/0.3ML ~~LOC~~ SOLN
30.0000 mg | SUBCUTANEOUS | Status: DC
  Administered 2015-11-17: 30 mg via SUBCUTANEOUS
  Filled 2015-11-17: qty 0.3

## 2015-11-17 MED ORDER — OXYCODONE-ACETAMINOPHEN 5-325 MG PO TABS: 1.0000 | ORAL_TABLET | ORAL | Status: DC | PRN

## 2015-11-17 MED ORDER — POLYETHYLENE GLYCOL 3350 17 G PO PACK: 17.0000 g | PACK | Freq: Every day | ORAL | Status: DC

## 2015-11-17 MED ORDER — ENOXAPARIN SODIUM 30 MG/0.3ML ~~LOC~~ SOLN: 30.0000 mg | SUBCUTANEOUS | Status: DC

## 2015-11-17 NOTE — Progress Notes (Signed)
1 Day Post-Op  Subjective: Patient reports her pain is controlled.  She is anxious to know when she will return to the nursing facility.  The VAC dressing remains intact and without leak. She will need a VAC machine at discharge.  The JP drain is working well and draining serosanguinous drainage.   Objective: Vital signs in last 24 hours: Temp:  [97.7 F (36.5 C)-98.9 F (37.2 C)] 98.9 F (37.2 C) (04/20 0555) Pulse Rate:  [67-85] 85 (04/20 0555) Resp:  [11-25] 16 (04/20 0555) BP: (107-148)/(48-95) 148/58 mmHg (04/20 0555) SpO2:  [91 %-98 %] 98 % (04/20 0555) Weight:  [53.524 kg (118 lb)] 53.524 kg (118 lb) (04/19 1930) Last BM Date: 11/14/15  Intake/Output from previous day: 04/19 0701 - 04/20 0700 In: 2864 [P.O.:730; I.V.:2134] Out: 9449 [Urine:4025; Drains:10] Intake/Output this shift:    General appearance: alert, cooperative, appears stated age and mild distress The left leg VAC remains intact over the graft without leak. JP drainage is clear serosanguinous drainage.   Lab Results:   Recent Labs  11/15/15 1435  WBC 8.3  HGB 9.4*  HCT 27.4*  PLT 402*   BMET  Recent Labs  11/15/15 1435  NA 135  K 4.2  CL 101  CO2 22  GLUCOSE 153*  BUN 9  CREATININE 1.03*  CALCIUM 9.4   PT/INR No results for input(s): LABPROT, INR in the last 72 hours. ABG No results for input(s): PHART, HCO3 in the last 72 hours.  Invalid input(s): PCO2, PO2  Studies/Results: No results found.  Anti-infectives: Anti-infectives    Start     Dose/Rate Route Frequency Ordered Stop   11/16/15 1140  polymyxin B 500,000 Units, bacitracin 50,000 Units in sodium chloride irrigation 0.9 % 500 mL irrigation  Status:  Discontinued       As needed 11/16/15 1141 11/16/15 1409   11/16/15 1130  polymyxin B 500,000 Units, bacitracin 50,000 Units in sodium chloride irrigation 0.9 % 500 mL irrigation  Status:  Discontinued      Irrigation To Surgery 11/16/15 1116 11/16/15 1559   11/16/15 0600   ceFAZolin (ANCEF) IVPB 2g/100 mL premix     2 g 200 mL/hr over 30 Minutes Intravenous To [facility]Stay Surgical 11/15/15 1241 11/16/15 1212      Assessment/Plan: s/p Procedure(s): LEFT GASTROC MUSCLE FLAP WITH SKIN GRAFT SPLIT THICKNESS AND VAC PLACEMENT (Left) APPLICATION OF WOUND VAC (Left) FREE FLAP TO EXTREMITY (Left) Will plan for discharge back to nursing facility once VAC obtained.  Will need to remain in left knee immobilizer for at least 2 more weeks.  No VAC dressing change at nursing home, but plan follow up next Friday in the office for [facility] removal/change from the graft. She will need daily KY jelly or surgical lubricant to the donor site of the left upper thigh.  Will start Lovenox daily for VTE prophylaxis.    LOS: 1 day    [facility] Plastic Surgery [phone]

## 2015-11-17 NOTE — Clinical Social Work Note (Signed)
Patient to be discharged to [facility]. Patient to be transported via EMS.  RN report number: [place], [place] [facility]: [phone] Surgical: [phone]

## 2015-11-17 NOTE — Discharge Summary (Signed)
Physician Discharge Summary  Patient ID: [name] MRN: [id number] DOB/AGE: 12-21-1940 75 y.o.  Admit date: 11/16/2015 Discharge date: 11/17/2015  Admission Diagnoses: Open left knee wound  Discharge Diagnoses:  Active Problems:   Open knee wound   Discharged Condition: stable  Hospital Course: [name] is a 75 yo female who underwent Left TKR on 09/28/15. She was readmitted 10/12/15 with septic arthritis of the left TKR and underwent I&D, polyethylene change and placement of antibiotic beads. Cultures grew MRSA and she was started on Vancomycin IV. She had a repeat I&D 10/18/15 for wound dehiscence and re-closure of the incision with Prevena VAC placement, but developed recurrent dehiscence of the incision.  She was readmitted 11/16/15 for left gastrocnemius muscle rotational flap coverage of the open left knee wound with placement of a split thickness skin graft over the muscle flap for closure. She tolerated the procedure well. The VAC dressing was placed over the flap/graft and will need to remain in place for at least 1 week post operatively. She was started on Lovenox for VTE prophylaxis.   The patient will need to follow up in the office next Thursday in the office at 8:00 am for recheck. Please see below for care for the donor site. The patient should remain in the knee immobilizer at all times except for bathing around the dressings. She can weight bear as tolerated in the immobilizer for transfers only.   Treatments: surgery: PROCEDURE: Procedure(s): LEFT GASTROC MUSCLE FLAP WITH SKIN GRAFT SPLIT THICKNESS AND VAC PLACEMENT (Left) APPLICATION OF WOUND VAC (Left)  Discharge Exam: Blood pressure 137/53, pulse 80, temperature 97.7 F (36.5 C), temperature source Oral, resp. rate 18, height 5' 3.5" (1.613 m), weight 53.524 kg (118 lb), SpO2 98 %. General appearance: alert, cooperative, appears stated age and mild distress Resp: clear to auscultation bilaterally Cardio:  regular rate and rhythm The left knee VAC dressing is intact and without leak. there are no signs of infection. The JP drainage is clear serosanguinous. The donor site dressing remains intact.   Disposition: 03-Skilled Nursing Facility      Discharge Instructions    Discharge wound care:    Complete by:  As directed   Do not change the VAC dressing as it is over a flap and skin graft. It will be changed in Dr. Eusebio Friendly office.   To the donor site of the left upper thigh, apply surgical lubricant or KY jelly copiously to the green dressing and then cover with gauze and ABD pad daily.  Empty the JP drainage twice daily and record amounts of drainage and send the drainage amounts with the patient to the office visit.     Maintain Knee Immobilizer    Complete by:  As directed             Medication List    STOP taking these medications        nitroGLYCERIN 0.2 mg/hr patch  Commonly known as:  NITRODUR - Dosed in mg/24 hr     VANCOMYCIN HCL IV      TAKE these medications        amLODipine 10 MG tablet  Commonly known as:  NORVASC  TAKE 1 TABLET (10 MG TOTAL) BY MOUTH DAILY.     aspirin EC 325 MG tablet  Take 1 tablet (325 mg total) by mouth daily.     atorvastatin 40 MG tablet  Commonly known as:  LIPITOR  TAKE ONE TABLET BY MOUTH DAILY.  carvedilol 6.25 MG tablet  Commonly known as:  COREG  NEED OV.     cloNIDine 0.1 MG tablet  Commonly known as:  CATAPRES  Take 1 tablet (0.1 mg total) by mouth 2 (two) times daily.     colchicine 0.6 MG tablet  Take 0.6-1.2 mg by mouth daily as needed. gout     diclofenac 75 MG EC tablet  Commonly known as:  VOLTAREN  Take 75 mg by mouth 2 (two) times daily.     enoxaparin 30 MG/0.3ML injection  Commonly known as:  LOVENOX  Inject 0.3 mLs (30 mg total) into the skin daily.     Glucosamine-Chondroitin Caps  Take 1 capsule by mouth daily.     hydrochlorothiazide 12.5 MG capsule  Commonly known as:  MICROZIDE  TAKE 1  CAPSULE (12.5 MG TOTAL) BY MOUTH EVERY MORNING.     HYDROcodone-acetaminophen 5-325 MG tablet  Commonly known as:  NORCO/VICODIN  Take 1 tablet by mouth 2 (two) times daily. Pt takes one in the morning and one at bedtime. In between, may have one every 4 hours prn     lisinopril 40 MG tablet  Commonly known as:  PRINIVIL,ZESTRIL  TAKE 1 TABLET (40 MG TOTAL) BY MOUTH DAILY.     multivitamin with minerals Tabs tablet  Take 1 tablet by mouth daily.     polyethylene glycol packet  Commonly known as:  MIRALAX / GLYCOLAX  Take 17 g by mouth daily.     vancomycin 500 mg in sodium chloride 0.9 % 100 mL  Inject 500 mg into the vein every 12 (twelve) hours.       Follow-up Information    Follow up with Wallace Going, DO On 11/24/2015.   Specialty:  Plastic Surgery   Why:  For wound re-check next Thursday at 8:00 am.    Contact information:   [place] [address] [phone]       Signed: [facility] [phone]

## 2015-11-17 NOTE — NC FL2 (Signed)
[place] LEVEL OF CARE SCREENING TOOL     IDENTIFICATION  Patient Name: [name] Birthdate: 02/09/1941 Sex: female Admission Date (Current Location): 11/16/2015  [facility] and [place] Number:  Herbalist and Address:  [facility], [place] [address] [place], [place] [address]      Provider Number: [id number]  Attending Physician Name and Address:  Wallace Going, DO  Relative Name and Phone Number:       Current Level of Care: Hospital Recommended Level of Care: [place] Prior Approval Number:    Date Approved/Denied:   PASRR Number: [id number]  Discharge Plan: SNF    Current Diagnoses: Patient Active Problem List   Diagnosis Date Noted  . Open knee wound 11/16/2015  . Wound dehiscence, surgical 10/18/2015  . Septic arthritis of knee, left ([place]) 10/12/2015  . Total knee replacement status 09/28/2015  . Bruit 07/10/2012  . Leukocytosis 09/19/2011  . Encephalopathy 09/19/2011  . AAA 01/19/2009  . ILIAC ARTERY ANEURYSM 01/19/2009  . HYPERLIPIDEMIA-MIXED 01/14/2009  . TOBACCO ABUSE 01/14/2009  . Essential hypertension 01/14/2009  . Coronary atherosclerosis 01/14/2009  . Cerebrovascular disease 01/14/2009  . Peripheral vascular disease ([place]) 01/14/2009  . CHRONIC OBSTRUCTIVE PULMONARY DISEASE 01/14/2009  . CHEST PAIN-UNSPECIFIED 01/14/2009    Orientation RESPIRATION BLADDER Height & Weight     Self, Time, Situation, Place  Normal Continent Weight: 118 lb (53.524 kg) Height:  5' 3.5" (161.3 cm)  BEHAVIORAL SYMPTOMS/MOOD NEUROLOGICAL BOWEL NUTRITION STATUS      Continent Diet (Please see discharge summary.)  AMBULATORY STATUS COMMUNICATION OF NEEDS Skin   Limited Assist Verbally Surgical wounds, Wound Vac                       Personal Care Assistance Level of Assistance  Bathing, Feeding, Dressing Bathing Assistance: Limited assistance Feeding assistance: Limited  assistance Dressing Assistance: Limited assistance     Functional Limitations Info             SPECIAL CARE FACTORS FREQUENCY  PT (By licensed PT), OT (By licensed OT)                    Contractures      Additional Factors Info  Code Status, Allergies Code Status Info: FULL Allergies Info: No known allergies.           Current Medications (11/17/2015):  This is the current hospital active medication list Current Facility-Administered Medications  Medication Dose Route Frequency Provider Last Rate Last Dose  . acetaminophen (TYLENOL) tablet 1,000 mg  1,000 mg Oral Q6H Claire S [name], DO   1,000 mg at 11/17/15 0645  . dextrose 5 % and 0.45 % NaCl with KCl 20 mEq/L infusion   Intravenous Continuous Loel Lofty [name], DO 125 mL/hr at 11/16/15 1653    . diphenhydrAMINE (BENADRYL) 12.5 MG/5ML elixir 12.5 mg  12.5 mg Oral Q6H PRN Loel Lofty [name], DO       Or  . diphenhydrAMINE (BENADRYL) injection 12.5 mg  12.5 mg Intravenous Q6H PRN Claire S [name], DO      . enoxaparin (LOVENOX) injection 30 mg  30 mg Subcutaneous Q24H Velna Hedgecock M [place], PA-C   30 mg at 11/17/15 1100  . HYDROmorphone (DILAUDID) injection 1 mg  1 mg Intravenous Q4H PRN Claire S [name], DO      . magnesium citrate solution 1 Bottle  1 Bottle Oral Once PRN Loel Lofty [name], DO      .  naproxen (NAPROSYN) tablet 500 mg  500 mg Oral BID PRN Loel Lofty [name], DO      . ondansetron (ZOFRAN-ODT) disintegrating tablet 4 mg  4 mg Oral Q6H PRN Loel Lofty [name], DO       Or  . ondansetron (ZOFRAN) injection 4 mg  4 mg Intravenous Q6H PRN Claire S [name], DO      . oxyCODONE-acetaminophen (PERCOCET/ROXICET) 5-325 MG per tablet 1-2 tablet  1-2 tablet Oral Q4H PRN Loel Lofty [name], DO      . polyethylene glycol (MIRALAX / GLYCOLAX) packet 17 g  17 g Oral Daily Claire S [name], DO   17 g at 11/17/15 1100     Discharge Medications: Please see discharge summary for a list of discharge  medications.  Relevant Imaging Results:  Relevant Lab Results:   Additional Information SSN: [id number]  Luna Kitchens  [facility] Plastic Surgery [phone]

## 2015-11-17 NOTE — Clinical Social Work Note (Signed)
Clinical Social Work Assessment  Patient Details  Name: [name] MRN: [id number] Date of Birth: May 30, 1941  Date of referral:  11/17/15               Reason for consult:  Facility Placement, Discharge Planning                Permission sought to share information with:  Facility Art therapist granted to share information::  Yes, Verbal Permission Granted  Name::        Agency::  [facility]  Relationship::     Contact Information:     Housing/Transportation Living arrangements for the past 2 months:  [place] of Information:  Patient Patient Interpreter Needed:  None Criminal Activity/Legal Involvement Pertinent to Current Situation/Hospitalization:  No - Comment as needed Significant Relationships:  Other(Comment) (Did not disclose.) Lives with:  Facility Resident Do you feel safe going back to the place where you live?  Yes Need for family participation in patient care:  No (Coment) (Patient able to make own decisions.)  Care giving concerns:  Patient expressed no concerns at this time.   Social Worker assessment / plan:  CSW received referral for patient to return to [place] at time of discharge. CSW confirmed discharge plan with patient. Per patient, patient agreeable to return to [place]. CSW to continue to follow and assist with discharge planning needs.  Employment status:  Retired Forensic scientist:  Medicare PT Recommendations:  Not assessed at this time [place] / Referral to community resources:  [place]  Patient/Family's Response to care:  Patient understanding and agreeable to CSW plan of care.  Patient/Family's Understanding of and Emotional Response to Diagnosis, Current Treatment, and Prognosis:  Patient understanding and agreeable to CSW plan of care.  Emotional Assessment Appearance:  Appears stated age Attitude/Demeanor/Rapport:  Other (Appropriate.) Affect (typically observed):   Accepting, Appropriate, Pleasant Orientation:  Oriented to Self, Oriented to Place, Oriented to  Time, Oriented to Situation Alcohol / Substance use:  Not Applicable Psych involvement (Current and /or in the community):  No (Comment) (Not appropriate on this admission.)  Discharge Needs  Concerns to be addressed:  No discharge needs identified Readmission within the last 30 days:  No Current discharge risk:  None Barriers to Discharge:  No Barriers Identified   Caroline Sauger, LCSW 11/17/2015, 12:24 PM [phone]

## 2015-11-17 NOTE — Progress Notes (Signed)
Pt discharged to [facility] facility this pm. Left thigh dressing changed prior to discharge due to fresh blood leakage noted around dressing site. Receiving nurse notified of need to monitor site due to bleeding when pt moves. Dressing orders also explained verbally to receiving facility nurse. Pt condition stable on discharge. Pt transported by [facility]

## 2015-11-21 DIAGNOSIS — M25562 Pain in left knee: Secondary | ICD-10-CM | POA: Diagnosis not present

## 2015-11-22 ENCOUNTER — Encounter: Payer: Self-pay | Admitting: Internal Medicine

## 2015-11-22 ENCOUNTER — Ambulatory Visit (INDEPENDENT_AMBULATORY_CARE_PROVIDER_SITE_OTHER): Payer: Medicare Other | Admitting: Internal Medicine

## 2015-11-22 VITALS — BP 110/63 | HR 74 | Temp 97.4°F

## 2015-11-22 DIAGNOSIS — S81002D Unspecified open wound, left knee, subsequent encounter: Secondary | ICD-10-CM

## 2015-11-22 DIAGNOSIS — M00062 Staphylococcal arthritis, left knee: Secondary | ICD-10-CM | POA: Diagnosis not present

## 2015-11-24 MED ORDER — DOXYCYCLINE HYCLATE 100 MG PO TABS: 100.0000 mg | ORAL_TABLET | Freq: Two times a day (BID) | ORAL | Status: DC

## 2015-11-24 NOTE — Assessment & Plan Note (Signed)
She is recovering well.  I will transition her to oral doxycycline and her picc can be removed at the facility.   rtc 5 weeks.

## 2015-11-24 NOTE — Progress Notes (Signed)
   Subjective:    Patient ID: [name], female    DOB: 1941/04/09, 75 y.o.   MRN: [id number]  HPI  Here for follow up for PJI, s/p polyexchange.  [name] is a 75 y.o. female with osteoarthritis of the left knee, s/p TKA on 3/1 who initially did well but started to have more pain then swelling, drainage, and underwent I and D with polyexchange by Dr. Sharol Given on 3/15. No recent fever or chills. Patient seen with son.  No complaints. In rehab.  Now has completed 6 weeks of IV vancomycin after growing MRSA in culture.  No complaints other than hopeful to get line removed.  Also follows Dr. Baltazar Apo for her wound on the same leg.  Has had multiple procedures for this.     Review of Systems  Constitutional: Negative for fatigue.  Gastrointestinal: Negative for nausea and diarrhea.  Skin: Negative for rash.       Objective:   Physical Exam  Constitutional: She appears well-developed and well-nourished. No distress.  Eyes: No scleral icterus.  Cardiovascular: Normal rate, regular rhythm and normal heart sounds.   No murmur heard. Skin: No rash noted.          Assessment & Plan:

## 2015-11-24 NOTE — Assessment & Plan Note (Signed)
continjues to follow Dr. Baltazar Apo

## 2015-11-25 ENCOUNTER — Encounter (HOSPITAL_COMMUNITY): Payer: Self-pay | Admitting: Plastic Surgery

## 2015-11-29 DIAGNOSIS — I10 Essential (primary) hypertension: Secondary | ICD-10-CM | POA: Diagnosis not present

## 2015-11-29 DIAGNOSIS — M79609 Pain in unspecified limb: Secondary | ICD-10-CM | POA: Diagnosis not present

## 2015-11-29 DIAGNOSIS — M1732 Unilateral post-traumatic osteoarthritis, left knee: Secondary | ICD-10-CM | POA: Diagnosis not present

## 2015-12-02 ENCOUNTER — Encounter: Payer: Self-pay | Admitting: Internal Medicine

## 2015-12-02 ENCOUNTER — Telehealth: Payer: Self-pay | Admitting: Cardiology

## 2015-12-02 DIAGNOSIS — R2689 Other abnormalities of gait and mobility: Secondary | ICD-10-CM | POA: Diagnosis not present

## 2015-12-02 DIAGNOSIS — M25562 Pain in left knee: Secondary | ICD-10-CM | POA: Diagnosis not present

## 2015-12-02 NOTE — Telephone Encounter (Signed)
Returned call. This is in regards to PCP recommendations for PT/OT rehab post-SNF discharge. Pt being followed by Dr. Linus Salmons w/ regards to infectious disease/internal med. She had listed Dr. [name] as PCP.  Caller aware Dr. [name] is pt's cardiologist. I let her know patient was last seen by Dr. [name] in Dec 2015. Since she has not been seen recently, advised to route concerns/requests 1st to Dr. Linus Salmons. Recommended scheduling visit w/ [place] for return overdue office visit.  Will route to [facility] for further f/u.

## 2015-12-02 NOTE — Telephone Encounter (Signed)
New message   Carney Living calling from a Skilled nursing facility is calling to have a follow up for Rehab per discharge  I didn't see any notes for Discharge to follow up for cardiology  Asked her for diagnosis or any urgent cardiac needs of pt, she said she is unsure of the reason  She verbalized that she assumed this was pt PCP, I told her no its cardiology  And she asked who is the pt PCP?   I informed her that I will have Dr. [name] RN call to help her with her concerns of needing orders for scheduling Rehab or getting information on PCP

## 2015-12-05 ENCOUNTER — Other Ambulatory Visit: Payer: Self-pay | Admitting: Plastic Surgery

## 2015-12-05 DIAGNOSIS — S81002A Unspecified open wound, left knee, initial encounter: Secondary | ICD-10-CM

## 2015-12-06 DIAGNOSIS — J449 Chronic obstructive pulmonary disease, unspecified: Secondary | ICD-10-CM | POA: Diagnosis not present

## 2015-12-06 DIAGNOSIS — I10 Essential (primary) hypertension: Secondary | ICD-10-CM | POA: Diagnosis not present

## 2015-12-06 DIAGNOSIS — Z48817 Encounter for surgical aftercare following surgery on the skin and subcutaneous tissue: Secondary | ICD-10-CM | POA: Diagnosis not present

## 2015-12-06 DIAGNOSIS — T8454XD Infection and inflammatory reaction due to internal left knee prosthesis, subsequent encounter: Secondary | ICD-10-CM | POA: Diagnosis not present

## 2015-12-06 DIAGNOSIS — T8132XA Disruption of internal operation (surgical) wound, not elsewhere classified, initial encounter: Secondary | ICD-10-CM | POA: Diagnosis not present

## 2015-12-06 DIAGNOSIS — I739 Peripheral vascular disease, unspecified: Secondary | ICD-10-CM | POA: Diagnosis not present

## 2015-12-07 ENCOUNTER — Encounter (HOSPITAL_COMMUNITY): Payer: Self-pay | Admitting: *Deleted

## 2015-12-07 DIAGNOSIS — J449 Chronic obstructive pulmonary disease, unspecified: Secondary | ICD-10-CM | POA: Diagnosis not present

## 2015-12-07 DIAGNOSIS — Z48817 Encounter for surgical aftercare following surgery on the skin and subcutaneous tissue: Secondary | ICD-10-CM | POA: Diagnosis not present

## 2015-12-07 DIAGNOSIS — T8132XA Disruption of internal operation (surgical) wound, not elsewhere classified, initial encounter: Secondary | ICD-10-CM | POA: Diagnosis not present

## 2015-12-07 DIAGNOSIS — T8454XD Infection and inflammatory reaction due to internal left knee prosthesis, subsequent encounter: Secondary | ICD-10-CM | POA: Diagnosis not present

## 2015-12-07 DIAGNOSIS — I739 Peripheral vascular disease, unspecified: Secondary | ICD-10-CM | POA: Diagnosis not present

## 2015-12-07 DIAGNOSIS — I10 Essential (primary) hypertension: Secondary | ICD-10-CM | POA: Diagnosis not present

## 2015-12-07 NOTE — Progress Notes (Signed)
Spoke with pt's son, [name] for pre-op call. He verified that allergies, medications, medical and surgical history have not changed since she was here in April. Pt is still on Lovenox injections, he states they were not given any instructions as to whether or not pt is to use it today. I have called and left message on Arthur Sorrentino's voicemail at Dr. Eusebio Friendly office. I will call [name] back with instructions once Maudie Mercury returns my call.

## 2015-12-07 NOTE — Progress Notes (Signed)
Spoke with Maudie Mercury from Dr. Eusebio Friendly office and she states that Dr. Marla Roe wants pt to not use Lovenox today and tomorrow and then she will get it started after surgery. I called pt's son, [name] and gave him this information. He voiced understanding and appreciation.

## 2015-12-08 ENCOUNTER — Encounter (HOSPITAL_COMMUNITY): Payer: Self-pay | Admitting: Plastic Surgery

## 2015-12-08 ENCOUNTER — Ambulatory Visit (HOSPITAL_COMMUNITY): Payer: Medicare Other | Admitting: Anesthesiology

## 2015-12-08 ENCOUNTER — Encounter (HOSPITAL_COMMUNITY)
Admission: RE | Disposition: A | Discharge status: Home / Self Care | Payer: Self-pay | Source: Ambulatory Visit | Attending: Plastic Surgery

## 2015-12-08 ENCOUNTER — Other Ambulatory Visit: Payer: Self-pay | Admitting: Plastic Surgery

## 2015-12-08 ENCOUNTER — Ambulatory Visit (HOSPITAL_COMMUNITY)
Admission: RE | Admit: 2015-12-08 | Discharge: 2015-12-08 | Disposition: A | Discharge status: Home / Self Care | Payer: Medicare Other | Source: Ambulatory Visit | Attending: Plastic Surgery | Admitting: Plastic Surgery

## 2015-12-08 DIAGNOSIS — Z87891 Personal history of nicotine dependence: Secondary | ICD-10-CM | POA: Diagnosis not present

## 2015-12-08 DIAGNOSIS — E785 Hyperlipidemia, unspecified: Secondary | ICD-10-CM | POA: Insufficient documentation

## 2015-12-08 DIAGNOSIS — Z79899 Other long term (current) drug therapy: Secondary | ICD-10-CM | POA: Diagnosis not present

## 2015-12-08 DIAGNOSIS — I2510 Atherosclerotic heart disease of native coronary artery without angina pectoris: Secondary | ICD-10-CM | POA: Diagnosis not present

## 2015-12-08 DIAGNOSIS — J449 Chronic obstructive pulmonary disease, unspecified: Secondary | ICD-10-CM | POA: Diagnosis not present

## 2015-12-08 DIAGNOSIS — S81002S Unspecified open wound, left knee, sequela: Secondary | ICD-10-CM | POA: Diagnosis not present

## 2015-12-08 DIAGNOSIS — I10 Essential (primary) hypertension: Secondary | ICD-10-CM | POA: Diagnosis not present

## 2015-12-08 DIAGNOSIS — Z955 Presence of coronary angioplasty implant and graft: Secondary | ICD-10-CM | POA: Insufficient documentation

## 2015-12-08 DIAGNOSIS — Z96652 Presence of left artificial knee joint: Secondary | ICD-10-CM | POA: Insufficient documentation

## 2015-12-08 DIAGNOSIS — I739 Peripheral vascular disease, unspecified: Secondary | ICD-10-CM | POA: Diagnosis not present

## 2015-12-08 DIAGNOSIS — Z96641 Presence of right artificial hip joint: Secondary | ICD-10-CM | POA: Insufficient documentation

## 2015-12-08 DIAGNOSIS — S81002A Unspecified open wound, left knee, initial encounter: Secondary | ICD-10-CM | POA: Diagnosis not present

## 2015-12-08 DIAGNOSIS — Z7902 Long term (current) use of antithrombotics/antiplatelets: Secondary | ICD-10-CM | POA: Diagnosis not present

## 2015-12-08 DIAGNOSIS — T8189XA Other complications of procedures, not elsewhere classified, initial encounter: Secondary | ICD-10-CM | POA: Diagnosis not present

## 2015-12-08 DIAGNOSIS — Z7982 Long term (current) use of aspirin: Secondary | ICD-10-CM | POA: Diagnosis not present

## 2015-12-08 DIAGNOSIS — M1990 Unspecified osteoarthritis, unspecified site: Secondary | ICD-10-CM | POA: Diagnosis not present

## 2015-12-08 HISTORY — PX: APPLICATION OF A-CELL OF EXTREMITY: SHX6303

## 2015-12-08 HISTORY — PX: APPLICATION OF WOUND VAC: SHX5189

## 2015-12-08 HISTORY — PX: I&D EXTREMITY: SHX5045

## 2015-12-08 LAB — CBC
HEMATOCRIT: 30.3 % — AB (ref 36.0–46.0)
HEMOGLOBIN: 9.6 g/dL — AB (ref 12.0–15.0)
MCH: 29.1 pg (ref 26.0–34.0)
MCHC: 31.7 g/dL (ref 30.0–36.0)
MCV: 91.8 fL (ref 78.0–100.0)
Platelets: 416 10*3/uL — ABNORMAL HIGH (ref 150–400)
RBC: 3.3 MIL/uL — ABNORMAL LOW (ref 3.87–5.11)
RDW: 16 % — ABNORMAL HIGH (ref 11.5–15.5)
WBC: 7.3 10*3/uL (ref 4.0–10.5)

## 2015-12-08 LAB — BASIC METABOLIC PANEL
ANION GAP: 13 (ref 5–15)
BUN: 35 mg/dL — AB (ref 6–20)
CHLORIDE: 105 mmol/L (ref 101–111)
CO2: 19 mmol/L — ABNORMAL LOW (ref 22–32)
Calcium: 9.4 mg/dL (ref 8.9–10.3)
Creatinine, Ser: 1.45 mg/dL — ABNORMAL HIGH (ref 0.44–1.00)
GFR calc Af Amer: 40 mL/min — ABNORMAL LOW (ref >60)
GFR calc non Af Amer: 35 mL/min — ABNORMAL LOW (ref >60)
GLUCOSE: 107 mg/dL — AB (ref 65–99)
POTASSIUM: 4.8 mmol/L (ref 3.5–5.1)
Sodium: 137 mmol/L (ref 135–145)

## 2015-12-08 SURGERY — IRRIGATION AND DEBRIDEMENT EXTREMITY
Anesthesia: Monitor Anesthesia Care | Site: Knee | Laterality: Left

## 2015-12-08 MED ORDER — ACETAMINOPHEN 325 MG PO TABS: 325.0000 mg | ORAL_TABLET | ORAL | Status: DC | PRN

## 2015-12-08 MED ORDER — MIDAZOLAM HCL 5 MG/5ML IJ SOLN
INTRAMUSCULAR | Status: DC | PRN
  Administered 2015-12-08 (×2): 1 mg via INTRAVENOUS

## 2015-12-08 MED ORDER — FENTANYL CITRATE (PF) 100 MCG/2ML IJ SOLN
INTRAMUSCULAR | Status: AC
  Filled 2015-12-08: qty 2

## 2015-12-08 MED ORDER — LIDOCAINE-EPINEPHRINE 1 %-1:100000 IJ SOLN
INTRAMUSCULAR | Status: AC
  Filled 2015-12-08: qty 1

## 2015-12-08 MED ORDER — ONDANSETRON HCL 4 MG/2ML IJ SOLN
INTRAMUSCULAR | Status: DC | PRN
  Administered 2015-12-08: 4 mg via INTRAVENOUS

## 2015-12-08 MED ORDER — FENTANYL CITRATE (PF) 100 MCG/2ML IJ SOLN
INTRAMUSCULAR | Status: DC | PRN
  Administered 2015-12-08: 50 ug via INTRAVENOUS
  Administered 2015-12-08 (×2): 25 ug via INTRAVENOUS

## 2015-12-08 MED ORDER — OXYCODONE HCL 5 MG PO TABS
5.0000 mg | ORAL_TABLET | Freq: Once | ORAL | Status: AC | PRN
  Administered 2015-12-08: 5 mg via ORAL

## 2015-12-08 MED ORDER — SODIUM CHLORIDE 0.9 % IR SOLN
Status: DC | PRN
  Administered 2015-12-08: 1000 mL

## 2015-12-08 MED ORDER — ACETAMINOPHEN 160 MG/5ML PO SOLN: 325.0000 mg | ORAL | Status: DC | PRN

## 2015-12-08 MED ORDER — SODIUM CHLORIDE 0.9 % IR SOLN
Status: DC | PRN
  Administered 2015-12-08: 500 mL

## 2015-12-08 MED ORDER — OXYCODONE HCL 5 MG/5ML PO SOLN: 5.0000 mg | Freq: Once | ORAL | Status: AC | PRN

## 2015-12-08 MED ORDER — LIDOCAINE-EPINEPHRINE 1 %-1:100000 IJ SOLN
INTRAMUSCULAR | Status: DC | PRN
  Administered 2015-12-08: 4 mL

## 2015-12-08 MED ORDER — PROPOFOL 500 MG/50ML IV EMUL
INTRAVENOUS | Status: DC | PRN
  Administered 2015-12-08: 50 ug/kg/min via INTRAVENOUS

## 2015-12-08 MED ORDER — LIDOCAINE 2% (20 MG/ML) 5 ML SYRINGE
INTRAMUSCULAR | Status: AC
  Filled 2015-12-08: qty 5

## 2015-12-08 MED ORDER — ONDANSETRON HCL 4 MG/2ML IJ SOLN
INTRAMUSCULAR | Status: AC
  Filled 2015-12-08: qty 2

## 2015-12-08 MED ORDER — FENTANYL CITRATE (PF) 250 MCG/5ML IJ SOLN
INTRAMUSCULAR | Status: AC
  Filled 2015-12-08: qty 5

## 2015-12-08 MED ORDER — LACTATED RINGERS IV SOLN
INTRAVENOUS | Status: DC
  Administered 2015-12-08: 12:00:00 via INTRAVENOUS

## 2015-12-08 MED ORDER — CEFAZOLIN SODIUM-DEXTROSE 2-4 GM/100ML-% IV SOLN
2.0000 g | INTRAVENOUS | Status: AC
  Administered 2015-12-08: 2 g via INTRAVENOUS
  Filled 2015-12-08: qty 100

## 2015-12-08 MED ORDER — MIDAZOLAM HCL 2 MG/2ML IJ SOLN
INTRAMUSCULAR | Status: AC
  Filled 2015-12-08: qty 2

## 2015-12-08 MED ORDER — FENTANYL CITRATE (PF) 100 MCG/2ML IJ SOLN
25.0000 ug | INTRAMUSCULAR | Status: DC | PRN
  Administered 2015-12-08 (×4): 25 ug via INTRAVENOUS

## 2015-12-08 MED ORDER — OXYCODONE HCL 5 MG PO TABS
ORAL_TABLET | ORAL | Status: AC
  Filled 2015-12-08: qty 1

## 2015-12-08 MED ORDER — PROPOFOL 10 MG/ML IV BOLUS
INTRAVENOUS | Status: DC | PRN
  Administered 2015-12-08: 10 mg via INTRAVENOUS

## 2015-12-08 SURGICAL SUPPLY — 67 items
BAG DECANTER FOR FLEXI CONT (MISCELLANEOUS) ×2 IMPLANT
BANDAGE ACE 4X5 VEL STRL LF (GAUZE/BANDAGES/DRESSINGS) ×2 IMPLANT
BANDAGE ACE 6X5 VEL STRL LF (GAUZE/BANDAGES/DRESSINGS) ×2 IMPLANT
BANDAGE ELASTIC 4 VELCRO ST LF (GAUZE/BANDAGES/DRESSINGS) IMPLANT
BLADE SURG ROTATE 9660 (MISCELLANEOUS) IMPLANT
BNDG GAUZE ELAST 4 BULKY (GAUZE/BANDAGES/DRESSINGS) ×2 IMPLANT
CANISTER SUCTION 2500CC (MISCELLANEOUS) ×3 IMPLANT
CANISTER WOUND CARE 500ML ATS (WOUND CARE) ×3 IMPLANT
CHLORAPREP W/TINT 26ML (MISCELLANEOUS) IMPLANT
COVER MAYO STAND STRL (DRAPES) ×2 IMPLANT
COVER SURGICAL LIGHT HANDLE (MISCELLANEOUS) ×3 IMPLANT
DRAPE EXTREMITY T 121X128X90 (DRAPE) IMPLANT
DRAPE INCISE IOBAN 66X45 STRL (DRAPES) IMPLANT
DRAPE ORTHO SPLIT 77X108 STRL (DRAPES)
DRAPE SURG ORHT 6 SPLT 77X108 (DRAPES) IMPLANT
DRESSING HYDROCOLLOID 4X4 (GAUZE/BANDAGES/DRESSINGS) ×3 IMPLANT
DRSG ADAPTIC 3X8 NADH LF (GAUZE/BANDAGES/DRESSINGS) IMPLANT
DRSG CUTIMED SORBACT 7X9 (GAUZE/BANDAGES/DRESSINGS) ×2 IMPLANT
DRSG PAD ABDOMINAL 8X10 ST (GAUZE/BANDAGES/DRESSINGS) ×2 IMPLANT
DRSG VAC ATS LRG SENSATRAC (GAUZE/BANDAGES/DRESSINGS) IMPLANT
DRSG VAC ATS MED SENSATRAC (GAUZE/BANDAGES/DRESSINGS) IMPLANT
DRSG VAC ATS SM SENSATRAC (GAUZE/BANDAGES/DRESSINGS) ×2 IMPLANT
ELECT CAUTERY BLADE 6.4 (BLADE) ×3 IMPLANT
ELECT LOOP CUT MONO 26F .012 R (MISCELLANEOUS) ×2 IMPLANT
ELECT REM PT RETURN 9FT ADLT (ELECTROSURGICAL) ×3
ELECTRODE REM PT RTRN 9FT ADLT (ELECTROSURGICAL) ×1 IMPLANT
GAUZE SPONGE 4X4 12PLY STRL (GAUZE/BANDAGES/DRESSINGS) ×2 IMPLANT
GEL ULTRASOUND 20GR AQUASONIC (MISCELLANEOUS) IMPLANT
GLOVE BIO SURGEON STRL SZ 6.5 (GLOVE) ×5 IMPLANT
GLOVE BIO SURGEONS STRL SZ 6.5 (GLOVE) ×3
GLOVE BIOGEL PI IND STRL 7.0 (GLOVE) IMPLANT
GLOVE BIOGEL PI INDICATOR 7.0 (GLOVE) ×2
GLOVE SURG SS PI 7.0 STRL IVOR (GLOVE) ×2 IMPLANT
GOWN STRL REUS W/ TWL LRG LVL3 (GOWN DISPOSABLE) ×3 IMPLANT
GOWN STRL REUS W/TWL LRG LVL3 (GOWN DISPOSABLE) ×9
HANDPIECE INTERPULSE COAX TIP (DISPOSABLE)
KIT BASIN OR (CUSTOM PROCEDURE TRAY) ×3 IMPLANT
KIT ROOM TURNOVER OR (KITS) ×3 IMPLANT
MATRIX SURGICAL PSMX 7X10CM (Tissue) ×2 IMPLANT
MICROMATRIX 1000MG (Tissue) ×3 IMPLANT
NDL HYPO 25GX1X1/2 BEV (NEEDLE) IMPLANT
NEEDLE HYPO 25GX1X1/2 BEV (NEEDLE) ×3 IMPLANT
NS IRRIG 1000ML POUR BTL (IV SOLUTION) ×3 IMPLANT
PACK GENERAL/GYN (CUSTOM PROCEDURE TRAY) ×3 IMPLANT
PACK ORTHO EXTREMITY (CUSTOM PROCEDURE TRAY) ×3 IMPLANT
PAD ARMBOARD 7.5X6 YLW CONV (MISCELLANEOUS) ×6 IMPLANT
PAD NEG PRESSURE SENSATRAC (MISCELLANEOUS) IMPLANT
SET HNDPC FAN SPRY TIP SCT (DISPOSABLE) IMPLANT
SOLUTION PARTIC MCRMTRX 1000MG (Tissue) IMPLANT
SPONGE LAP 18X18 X RAY DECT (DISPOSABLE) ×2 IMPLANT
STAPLER VISISTAT 35W (STAPLE) IMPLANT
STOCKINETTE IMPERVIOUS 9X36 MD (GAUZE/BANDAGES/DRESSINGS) IMPLANT
STOCKINETTE IMPERVIOUS LG (DRAPES) IMPLANT
SURGILUBE 2OZ TUBE FLIPTOP (MISCELLANEOUS) ×4 IMPLANT
SUT MNCRL AB 3-0 PS2 18 (SUTURE) ×4 IMPLANT
SUT MNCRL AB 4-0 PS2 18 (SUTURE) ×2 IMPLANT
SUT PDS AB 3-0 SH 27 (SUTURE) ×2 IMPLANT
SUT SILK 4 0 P 3 (SUTURE) ×2 IMPLANT
SUT SILK 4 0 PS 2 (SUTURE) IMPLANT
SUT VIC AB 5-0 PS2 18 (SUTURE) ×4 IMPLANT
SYRINGE 10CC LL (SYRINGE) ×2 IMPLANT
TOWEL OR 17X24 6PK STRL BLUE (TOWEL DISPOSABLE) ×3 IMPLANT
TOWEL OR 17X26 10 PK STRL BLUE (TOWEL DISPOSABLE) ×3 IMPLANT
TUBE CONNECTING 12'X1/4 (SUCTIONS) ×1
TUBE CONNECTING 12X1/4 (SUCTIONS) ×2 IMPLANT
UNDERPAD 30X30 INCONTINENT (UNDERPADS AND DIAPERS) IMPLANT
YANKAUER SUCT BULB TIP NO VENT (SUCTIONS) ×3 IMPLANT

## 2015-12-08 NOTE — Op Note (Signed)
Operative Note   DATE OF OPERATION: 12/08/2015  LOCATION: [facility] Outpatient  SURGICAL DIVISION: Plastic Surgery  PREOPERATIVE DIAGNOSES:  Left knee wound  6 x 8 cm  POSTOPERATIVE DIAGNOSES:  same  PROCEDURE:  Preparation of left knee wound for placement of Acell (powder 1 gm and sheet 7 x 10 cm) and VAC  SURGEON: Claire Sanger [name], DO  ASSISTANT: Shawn Rayburn, PA  ANESTHESIA:  General.   COMPLICATIONS: None.   INDICATIONS FOR PROCEDURE:  The patient, [name] is a 75 y.o. female born on 1940/12/16, is here for treatment of a left knee wound.  She had a knee replacement with exposed hardware.  She underwent a gastrocnemius muscle flap.  She started to bend her knee and ripped the muscle loose.  She presents for further care. MRN: [id number]  CONSENT:  Informed consent was obtained directly from the patient. Risks, benefits and alternatives were fully discussed. Specific risks including but not limited to bleeding, infection, hematoma, seroma, scarring, pain, infection, contracture, asymmetry, wound healing problems, and need for further surgery were all discussed. The patient did have an ample opportunity to have questions answered to satisfaction.   DESCRIPTION OF PROCEDURE:  The patient was taken to the operating room. SCDs were placed and IV antibiotics were given. The patient's operative site was prepped and draped in a sterile fashion. A time out was performed and all information was confirmed to be correct.  Anesthesia was administered.  The knee was irrigated with antibiotic solution and saline.  The #10 blade was used to debride some fibrous tissue at the medial aspect.  The skin edge was released to help bring the skin and soft tissue more laterally.  All the Acell powder and sheet were applied to the 6 x 8 cm wound and secured with 5-0 Vicryl.  The sorbact was placed with KY and the VAC. The patient tolerated the procedure well.  A plantar blocking splint  was applied to prevent knee bending.  There were no complications. The patient was allowed to wake from anesthesia, extubated and taken to the recovery room in satisfactory condition.

## 2015-12-08 NOTE — Anesthesia Procedure Notes (Signed)
Procedure Name: MAC Date/Time: 12/08/2015 1:51 PM Performed by: Jenne Campus Pre-anesthesia Checklist: Patient identified, Emergency Drugs available, Suction available, Patient being monitored and Timeout performed Patient Re-evaluated:Patient Re-evaluated prior to inductionOxygen Delivery Method: Simple face mask

## 2015-12-08 NOTE — Interval H&P Note (Signed)
History and Physical Interval Note:  12/08/2015 8:12 AM  [name]  has presented today for surgery, with the diagnosis of left knee wound  The various methods of treatment have been discussed with the patient and family. After consideration of risks, benefits and other options for treatment, the patient has consented to  Procedure(s): IRRIGATION AND DEBRIDEMENT knee (Left) APPLICATION OF A-CELL OF knee (Left) APPLICATION OF WOUND VAC to knee (Left) as a surgical intervention .  The patient's history has been reviewed, patient examined, no change in status, stable for surgery.  I have reviewed the patient's chart and labs.  Questions were answered to the patient's satisfaction.     Wallace Going

## 2015-12-08 NOTE — Brief Op Note (Signed)
12/08/2015  1:50 PM  PATIENT:  [name]  75 y.o. female  PRE-OPERATIVE DIAGNOSIS:  left knee wound  POST-OPERATIVE DIAGNOSIS:  same  PROCEDURE:  Procedure(s): IRRIGATION AND DEBRIDEMENT knee (Left) APPLICATION OF A-CELL OF knee (Left) APPLICATION OF WOUND VAC to knee (Left)  SURGEON:  Surgeon(s) and Role:    * Loel Lofty [name], DO - Primary  PHYSICIAN ASSISTANT: Shawn Rayburn, PA  ASSISTANTS: none   ANESTHESIA:   MAC  EBL:     BLOOD ADMINISTERED:none  DRAINS: none   LOCAL MEDICATIONS USED:  NONE  SPECIMEN:  No Specimen  DISPOSITION OF SPECIMEN:  N/A  COUNTS:  YES  TOURNIQUET:  * No tourniquets in log *  DICTATION: .Dragon Dictation  PLAN OF CARE: Discharge to home after PACU  PATIENT DISPOSITION:  PACU - hemodynamically stable.   Delay start of Pharmacological VTE agent (>24hrs) due to surgical blood loss or risk of bleeding: no

## 2015-12-08 NOTE — Transfer of Care (Signed)
Immediate Anesthesia Transfer of Care Note  Patient: [name]  Procedure(s) Performed: Procedure(s): IRRIGATION AND DEBRIDEMENT knee (Left) APPLICATION OF A-CELL OF knee (Left) APPLICATION OF WOUND VAC  AND CAST to knee (Left)  Patient Location: PACU  Anesthesia Type:MAC  Level of Consciousness: awake, oriented and patient cooperative  Airway & Oxygen Therapy: Patient Spontanous Breathing and Patient connected to nasal cannula oxygen  Post-op Assessment: Report given to RN and Post -op Vital signs reviewed and stable  Post vital signs: Reviewed  Last Vitals:  Filed Vitals:   12/08/15 1135  BP: 92/45  Pulse: 75  Temp: 36.3 C  Resp: 18    Last Pain: There were no vitals filed for this visit.       Complications: No apparent anesthesia complications

## 2015-12-08 NOTE — Interval H&P Note (Signed)
History and Physical Interval Note:  12/08/2015 12:55 PM  [name]  has presented today for surgery, with the diagnosis of left knee wound  The various methods of treatment have been discussed with the patient and family. After consideration of risks, benefits and other options for treatment, the patient has consented to  Procedure(s): IRRIGATION AND DEBRIDEMENT knee (Left) APPLICATION OF A-CELL OF knee (Left) APPLICATION OF WOUND VAC to knee (Left) as a surgical intervention .  The patient's history has been reviewed, patient examined, no change in status, stable for surgery.  I have reviewed the patient's chart and labs.  Questions were answered to the patient's satisfaction.     Wallace Going

## 2015-12-08 NOTE — H&P (View-Only) (Signed)
[name] is an 75 y.o. female.   Chief Complaint: left knee wound HPI: The patient is a 75 yrs old wf here for treatment of left knee wound. She underwent a left knee replacement in March. It got infected and opened up ~ 2 times. She now has a 4 x 5 cm area of open skin to fascia. The implant appears to be covered. The open area is over the knee. The proximal and distal incision are intact but not well healed yet. She is wearing a brace and doing wet to dry dressing at the nursing facility. She has multiple medical conditions but they are well managed. She is on IV antibiotics for the infection. There is concern about the joint if the area is left open. She wants to be able to walk and care for herself.   Past Medical History  Diagnosis Date  . CAD (coronary artery disease)   . HTN (hypertension)   . Hyperlipidemia   . Cerebrovascular disease   . Arthritis   . PVD (peripheral vascular disease) (HCC)     dvt in leg  . MRSA (methicillin resistant staph aureus) culture positive   . Wears glasses   . Heart murmur   . History of bronchitis   . Memory impairment     Past Surgical History  Procedure Laterality Date  . Knee surgery      denies  . Abdominal hysterectomy    . Total hip arthroplasty Right 09/10/2012    Dr Sharol Given  . Total hip arthroplasty Right 09/10/2012    Procedure: TOTAL HIP ARTHROPLASTY;  Surgeon: Newt Minion, MD;  Location: [facility];  Service: Orthopedics;  Laterality: Right;  Right Total Hip Arthroplasty  . Coronary angioplasty  07    3 stents placed  . Total knee arthroplasty Left 09/28/2015    Procedure: TOTAL KNEE ARTHROPLASTY;  Surgeon: Newt Minion, MD;  Location: [facility];  Service: Orthopedics;  Laterality: Left;  . I&d knee with poly exchange Left 10/12/2015    Procedure: Irrigation and Debridement , Poly Exchange, Antibiotic Beads Left Knee;  Surgeon: Newt Minion, MD;  Location: [facility];  Service: Orthopedics;  Laterality: Left;  . I&d extremity Left 10/18/2015     Procedure: Left Knee Washout, Reclosure;  Surgeon: Newt Minion, MD;  Location: [facility];  Service: Orthopedics;  Laterality: Left;  [name] Eye surgery Bilateral     cataracts  . Colonoscopy    . Esophagogastroduodenoscopy      Family History  Problem Relation Age of Onset  . Breast cancer Mother   . Leukemia Father   . Stroke Sister   . Bone cancer     Social History:  reports that she has quit smoking. Her smoking use included Cigarettes. She has a 25 pack-year smoking history. She has never used smokeless tobacco. She reports that she does not drink alcohol or use illicit drugs.  Allergies: No Known Allergies  No prescriptions prior to admission    Results for orders placed or performed during the hospital encounter of 11/15/15 (from the past 48 hour(s))  Surgical pcr screen     Status: None   Collection Time: 11/15/15  2:35 PM  Result Value Ref Range   MRSA, PCR NEGATIVE NEGATIVE   Staphylococcus aureus NEGATIVE NEGATIVE    Comment:        The Xpert SA Assay (FDA approved for NASAL specimens in patients over 56 years of age), is one component of a comprehensive surveillance  program.  Test performance has been validated by [facility] for patients greater than or equal to 44 year old. It is not intended to diagnose infection nor to guide or monitor treatment.   CBC     Status: Abnormal   Collection Time: 11/15/15  2:35 PM  Result Value Ref Range   WBC 8.3 4.0 - 10.5 K/uL   RBC 2.96 (L) 3.87 - 5.11 MIL/uL   Hemoglobin 9.4 (L) 12.0 - 15.0 g/dL   HCT 27.4 (L) 36.0 - 46.0 %   MCV 92.6 78.0 - 100.0 fL   MCH 31.8 26.0 - 34.0 pg   MCHC 34.3 30.0 - 36.0 g/dL   RDW 15.3 11.5 - 15.5 %   Platelets 402 (H) 150 - 400 K/uL  Basic metabolic panel     Status: Abnormal   Collection Time: 11/15/15  2:35 PM  Result Value Ref Range   Sodium 135 135 - 145 mmol/L   Potassium 4.2 3.5 - 5.1 mmol/L   Chloride 101 101 - 111 mmol/L   CO2 22 22 - 32 mmol/L   Glucose, Bld 153 (H) 65 -  99 mg/dL   BUN 9 6 - 20 mg/dL   Creatinine, Ser 1.03 (H) 0.44 - 1.00 mg/dL   Calcium 9.4 8.9 - 10.3 mg/dL   GFR calc non Af Amer 52 (L) >60 mL/min   GFR calc Af Amer >60 >60 mL/min    Comment: (NOTE) The eGFR has been calculated using the CKD EPI equation. This calculation has not been validated in all clinical situations. eGFR's persistently <60 mL/min signify possible Chronic Kidney Disease.    Anion gap 12 5 - 15   No results found.  Review of Systems  Constitutional: Negative.   HENT: Negative.   Eyes: Negative.   Respiratory: Negative.   Cardiovascular: Negative.   Gastrointestinal: Negative.   Genitourinary: Negative.   Musculoskeletal: Negative.   Skin: Negative.   Neurological: Negative.   Psychiatric/Behavioral: Negative.     There were no vitals taken for this visit. Physical Exam  Constitutional: She is oriented to person, place, and time. She appears well-developed and well-nourished.  HENT:  Head: Normocephalic and atraumatic.  Eyes: Conjunctivae and EOM are normal. Pupils are equal, round, and reactive to light.  Cardiovascular: Normal rate.   Respiratory: Effort normal.  GI: Soft.  Neurological: She is alert and oriented to person, place, and time.  Skin: Skin is warm.  Psychiatric: She has a normal mood and affect. Her behavior is normal. Judgment and thought content normal.     Assessment/Plan Plan for gastrocnemius muscle flap to left knee with possible Acell and VAC placement to left knee.  Wallace Going, DO 11/16/2015, 8:13 AM

## 2015-12-08 NOTE — Discharge Instructions (Signed)
Do not change VAC for one week VAC on 125 mmHg pressure

## 2015-12-08 NOTE — Anesthesia Preprocedure Evaluation (Signed)
Anesthesia Evaluation  Patient identified by MRN, date of birth, ID band Patient awake    Reviewed: Allergy & Precautions, H&P , NPO status , Patient's Chart, lab work & pertinent test results, reviewed documented beta blocker date and time   Airway Mallampati: II  TM Distance: >3 FB Neck ROM: Full    Dental no notable dental hx. (+) Upper Dentures, Lower Dentures, Dental Advisory Given   Pulmonary neg pulmonary ROS, COPD, Current Smoker, former smoker,    Pulmonary exam normal breath sounds clear to auscultation       Cardiovascular hypertension, Pt. on medications and Pt. on home beta blockers + CAD, + Cardiac Stents and + Peripheral Vascular Disease   Rhythm:Regular Rate:Normal     Neuro/Psych negative neurological ROS  negative psych ROS   GI/Hepatic negative GI ROS, Neg liver ROS,   Endo/Other  negative endocrine ROS  Renal/GU negative Renal ROS  negative genitourinary   Musculoskeletal  (+) Arthritis , Osteoarthritis,    Abdominal   Peds  Hematology negative hematology ROS (+)   Anesthesia Other Findings   Reproductive/Obstetrics negative OB ROS                             Anesthesia Physical Anesthesia Plan  ASA: III  Anesthesia Plan: MAC   Post-op Pain Management:    Induction: Intravenous  Airway Management Planned: Natural Airway, Nasal Cannula and Simple Face Mask  Additional Equipment: None  Intra-op Plan:   Post-operative Plan:   Informed Consent: I have reviewed the patients History and Physical, chart, labs and discussed the procedure including the risks, benefits and alternatives for the proposed anesthesia with the patient or authorized representative who has indicated his/her understanding and acceptance.   Dental advisory given  Plan Discussed with: CRNA and Surgeon  Anesthesia Plan Comments:         Anesthesia Quick Evaluation

## 2015-12-09 ENCOUNTER — Encounter (HOSPITAL_COMMUNITY): Payer: Self-pay | Admitting: Plastic Surgery

## 2015-12-09 DIAGNOSIS — Z48817 Encounter for surgical aftercare following surgery on the skin and subcutaneous tissue: Secondary | ICD-10-CM | POA: Diagnosis not present

## 2015-12-09 DIAGNOSIS — T8454XD Infection and inflammatory reaction due to internal left knee prosthesis, subsequent encounter: Secondary | ICD-10-CM | POA: Diagnosis not present

## 2015-12-09 DIAGNOSIS — I739 Peripheral vascular disease, unspecified: Secondary | ICD-10-CM | POA: Diagnosis not present

## 2015-12-09 DIAGNOSIS — I10 Essential (primary) hypertension: Secondary | ICD-10-CM | POA: Diagnosis not present

## 2015-12-09 DIAGNOSIS — J449 Chronic obstructive pulmonary disease, unspecified: Secondary | ICD-10-CM | POA: Diagnosis not present

## 2015-12-09 DIAGNOSIS — T8132XA Disruption of internal operation (surgical) wound, not elsewhere classified, initial encounter: Secondary | ICD-10-CM | POA: Diagnosis not present

## 2015-12-09 NOTE — Anesthesia Postprocedure Evaluation (Signed)
Anesthesia Post Note  Patient: [name]  Procedure(s) Performed: Procedure(s) (LRB): IRRIGATION AND DEBRIDEMENT knee (Left) APPLICATION OF A-CELL OF knee (Left) APPLICATION OF WOUND VAC  AND CAST to knee (Left)  Patient location during evaluation: PACU Anesthesia Type: MAC Level of consciousness: awake and alert Pain management: pain level controlled Vital Signs Assessment: post-procedure vital signs reviewed and stable Respiratory status: spontaneous breathing, nonlabored ventilation, respiratory function stable and patient connected to nasal cannula oxygen Cardiovascular status: stable and blood pressure returned to baseline Anesthetic complications: no    Last Vitals:  Filed Vitals:   12/08/15 1630 12/08/15 1632  BP:    Pulse:  82  Temp: 36.5 C   Resp: 16 10    Last Pain:  Filed Vitals:   12/08/15 1724  PainSc: 10-Worst pain ever                 Eclectic S

## 2015-12-10 DIAGNOSIS — Z48817 Encounter for surgical aftercare following surgery on the skin and subcutaneous tissue: Secondary | ICD-10-CM | POA: Diagnosis not present

## 2015-12-10 DIAGNOSIS — I739 Peripheral vascular disease, unspecified: Secondary | ICD-10-CM | POA: Diagnosis not present

## 2015-12-10 DIAGNOSIS — J449 Chronic obstructive pulmonary disease, unspecified: Secondary | ICD-10-CM | POA: Diagnosis not present

## 2015-12-10 DIAGNOSIS — T8454XD Infection and inflammatory reaction due to internal left knee prosthesis, subsequent encounter: Secondary | ICD-10-CM | POA: Diagnosis not present

## 2015-12-10 DIAGNOSIS — T8132XA Disruption of internal operation (surgical) wound, not elsewhere classified, initial encounter: Secondary | ICD-10-CM | POA: Diagnosis not present

## 2015-12-10 DIAGNOSIS — I10 Essential (primary) hypertension: Secondary | ICD-10-CM | POA: Diagnosis not present

## 2015-12-12 ENCOUNTER — Inpatient Hospital Stay: Payer: Medicare Other | Admitting: Internal Medicine

## 2015-12-12 ENCOUNTER — Encounter (HOSPITAL_COMMUNITY): Payer: Self-pay | Admitting: Plastic Surgery

## 2015-12-14 DIAGNOSIS — I10 Essential (primary) hypertension: Secondary | ICD-10-CM | POA: Diagnosis not present

## 2015-12-14 DIAGNOSIS — D649 Anemia, unspecified: Secondary | ICD-10-CM | POA: Diagnosis not present

## 2015-12-14 DIAGNOSIS — E559 Vitamin D deficiency, unspecified: Secondary | ICD-10-CM | POA: Diagnosis not present

## 2015-12-14 DIAGNOSIS — R4182 Altered mental status, unspecified: Secondary | ICD-10-CM | POA: Diagnosis not present

## 2015-12-16 DIAGNOSIS — I10 Essential (primary) hypertension: Secondary | ICD-10-CM | POA: Diagnosis not present

## 2015-12-16 DIAGNOSIS — K5900 Constipation, unspecified: Secondary | ICD-10-CM | POA: Diagnosis not present

## 2015-12-16 DIAGNOSIS — Z471 Aftercare following joint replacement surgery: Secondary | ICD-10-CM | POA: Diagnosis not present

## 2015-12-21 DIAGNOSIS — Z48817 Encounter for surgical aftercare following surgery on the skin and subcutaneous tissue: Secondary | ICD-10-CM | POA: Diagnosis not present

## 2015-12-21 DIAGNOSIS — I10 Essential (primary) hypertension: Secondary | ICD-10-CM | POA: Diagnosis not present

## 2015-12-21 DIAGNOSIS — T8132XA Disruption of internal operation (surgical) wound, not elsewhere classified, initial encounter: Secondary | ICD-10-CM | POA: Diagnosis not present

## 2015-12-21 DIAGNOSIS — T8454XD Infection and inflammatory reaction due to internal left knee prosthesis, subsequent encounter: Secondary | ICD-10-CM | POA: Diagnosis not present

## 2015-12-21 DIAGNOSIS — J449 Chronic obstructive pulmonary disease, unspecified: Secondary | ICD-10-CM | POA: Diagnosis not present

## 2015-12-21 DIAGNOSIS — I739 Peripheral vascular disease, unspecified: Secondary | ICD-10-CM | POA: Diagnosis not present

## 2015-12-22 DIAGNOSIS — J449 Chronic obstructive pulmonary disease, unspecified: Secondary | ICD-10-CM | POA: Diagnosis not present

## 2015-12-22 DIAGNOSIS — T8132XA Disruption of internal operation (surgical) wound, not elsewhere classified, initial encounter: Secondary | ICD-10-CM | POA: Diagnosis not present

## 2015-12-22 DIAGNOSIS — T8454XD Infection and inflammatory reaction due to internal left knee prosthesis, subsequent encounter: Secondary | ICD-10-CM | POA: Diagnosis not present

## 2015-12-22 DIAGNOSIS — I739 Peripheral vascular disease, unspecified: Secondary | ICD-10-CM | POA: Diagnosis not present

## 2015-12-22 DIAGNOSIS — Z48817 Encounter for surgical aftercare following surgery on the skin and subcutaneous tissue: Secondary | ICD-10-CM | POA: Diagnosis not present

## 2015-12-22 DIAGNOSIS — I10 Essential (primary) hypertension: Secondary | ICD-10-CM | POA: Diagnosis not present

## 2015-12-23 DIAGNOSIS — Z48817 Encounter for surgical aftercare following surgery on the skin and subcutaneous tissue: Secondary | ICD-10-CM | POA: Diagnosis not present

## 2015-12-23 DIAGNOSIS — J449 Chronic obstructive pulmonary disease, unspecified: Secondary | ICD-10-CM | POA: Diagnosis not present

## 2015-12-23 DIAGNOSIS — T8132XA Disruption of internal operation (surgical) wound, not elsewhere classified, initial encounter: Secondary | ICD-10-CM | POA: Diagnosis not present

## 2015-12-23 DIAGNOSIS — I739 Peripheral vascular disease, unspecified: Secondary | ICD-10-CM | POA: Diagnosis not present

## 2015-12-23 DIAGNOSIS — T8454XD Infection and inflammatory reaction due to internal left knee prosthesis, subsequent encounter: Secondary | ICD-10-CM | POA: Diagnosis not present

## 2015-12-23 DIAGNOSIS — I10 Essential (primary) hypertension: Secondary | ICD-10-CM | POA: Diagnosis not present

## 2015-12-26 DIAGNOSIS — J449 Chronic obstructive pulmonary disease, unspecified: Secondary | ICD-10-CM | POA: Diagnosis not present

## 2015-12-26 DIAGNOSIS — T8132XA Disruption of internal operation (surgical) wound, not elsewhere classified, initial encounter: Secondary | ICD-10-CM | POA: Diagnosis not present

## 2015-12-26 DIAGNOSIS — T8454XD Infection and inflammatory reaction due to internal left knee prosthesis, subsequent encounter: Secondary | ICD-10-CM | POA: Diagnosis not present

## 2015-12-26 DIAGNOSIS — I739 Peripheral vascular disease, unspecified: Secondary | ICD-10-CM | POA: Diagnosis not present

## 2015-12-26 DIAGNOSIS — I10 Essential (primary) hypertension: Secondary | ICD-10-CM | POA: Diagnosis not present

## 2015-12-26 DIAGNOSIS — Z48817 Encounter for surgical aftercare following surgery on the skin and subcutaneous tissue: Secondary | ICD-10-CM | POA: Diagnosis not present

## 2015-12-27 ENCOUNTER — Ambulatory Visit: Payer: Medicare Other | Admitting: Internal Medicine

## 2015-12-28 ENCOUNTER — Encounter: Payer: Self-pay | Admitting: Physician Assistant

## 2015-12-28 ENCOUNTER — Ambulatory Visit (INDEPENDENT_AMBULATORY_CARE_PROVIDER_SITE_OTHER): Payer: Medicare Other | Admitting: Physician Assistant

## 2015-12-28 VITALS — BP 97/58 | HR 66 | Ht 62.5 in | Wt 120.0 lb

## 2015-12-28 DIAGNOSIS — I679 Cerebrovascular disease, unspecified: Secondary | ICD-10-CM

## 2015-12-28 DIAGNOSIS — E785 Hyperlipidemia, unspecified: Secondary | ICD-10-CM | POA: Diagnosis not present

## 2015-12-28 DIAGNOSIS — I952 Hypotension due to drugs: Secondary | ICD-10-CM

## 2015-12-28 DIAGNOSIS — I959 Hypotension, unspecified: Secondary | ICD-10-CM | POA: Insufficient documentation

## 2015-12-28 DIAGNOSIS — F17200 Nicotine dependence, unspecified, uncomplicated: Secondary | ICD-10-CM | POA: Diagnosis not present

## 2015-12-28 DIAGNOSIS — I739 Peripheral vascular disease, unspecified: Secondary | ICD-10-CM

## 2015-12-28 DIAGNOSIS — I2510 Atherosclerotic heart disease of native coronary artery without angina pectoris: Secondary | ICD-10-CM

## 2015-12-28 DIAGNOSIS — I10 Essential (primary) hypertension: Secondary | ICD-10-CM | POA: Diagnosis not present

## 2015-12-28 NOTE — Progress Notes (Signed)
Patient ID: [name], female   DOB: [birth year], 75 y.o.   MRN: [id number]    Date:  12/28/2015   ID:  [name], DOB 1941/05/31, MRN [id number]  PCP:  No PCP Per Patient  Primary Cardiologist:  [name]   Chief Complaint  Patient presents with  . Follow-up    no chest pain, no shortness of breath, has edema, having some lightheaded or dizziness     History of Present Illness: [name] is a 75 y.o. female issue coronary disease and is status post PCI of her circumflex and right coronary artery with drug-eluting stents in August 2008. A Myoview was performed in December of 2012. There was a small fixed apical defect consistent with thinning but no ischemia. Ejection fraction 75%. Abdominal ultrasound in January 2014 showed moderate right and severe left common iliac stenosis. ABIs in January of 2014 were normal. Patient seen by Dr Fletcher Anon and medical therapy recommended for now. Last carotid Dopplers were performed in November 2016 with Stable 1-39% RICA stenosis, Stable 64-33% LICA stenosis. >50% LECA stenosis.  Follow-up was recommended in one year. There was 40-59% left stenosis and 0-39% right and followup was recommended in 12 months.   She had a knee replacement on March 1 and then had an infection had it cleaned out and then it had skin grafts 2 and is in need of a third.  She gets very confused or 2-3 days after getting anesthesia.  She is currently in a wheelchair and her left leg is wrapped and braced.  She is here for routine evaluation. She has not been seen in the last 18 months because of everything is been going on with her knee. Besides the knee she does not have any particular complaints. She's also had some dysuria and increased frequency. They stopped at the lab on the way out and had a urinalysis.  The patient currently denies nausea, vomiting, fever, chest pain, shortness of breath, orthopnea, dizziness, PND, cough, congestion, abdominal pain, hematochezia,  melena, right lower extremity edema.  Wt Readings from Last 3 Encounters:  12/28/15 120 lb (54.432 kg)  12/08/15 117 lb (53.071 kg)  11/16/15 118 lb (53.524 kg)     Past Medical History  Diagnosis Date  . CAD (coronary artery disease)   . HTN (hypertension)   . Hyperlipidemia   . Cerebrovascular disease   . Arthritis   . PVD (peripheral vascular disease) (HCC)     dvt in leg  . MRSA (methicillin resistant staph aureus) culture positive   . Wears glasses   . Heart murmur   . History of bronchitis   . Memory impairment     Current Outpatient Prescriptions  Medication Sig Dispense Refill  . amLODipine (NORVASC) 10 MG tablet TAKE 1 TABLET (10 MG TOTAL) BY MOUTH DAILY. (Patient taking differently: Take 5 mg by mouth daily. ) 90 tablet 3  . aspirin 81 MG tablet Take 81 mg by mouth daily.    [name] atorvastatin (LIPITOR) 40 MG tablet TAKE ONE TABLET BY MOUTH DAILY. (Patient taking differently: Take 40 mg by mouth daily at 6 PM. ) 90 tablet 0  . colchicine 0.6 MG tablet Take 0.6-1.2 mg by mouth daily as needed. gout    . diclofenac (VOLTAREN) 75 MG EC tablet Take 75 mg by mouth 2 (two) times daily.     [name] doxycycline (VIBRA-TABS) 100 MG tablet Take 1 tablet (100 mg total) by mouth 2 (two) times daily. 60 tablet 5  .  enoxaparin (LOVENOX) 30 MG/0.3ML injection Inject 0.3 mLs (30 mg total) into the skin daily. 0 Syringe   . Glucosamine-Chondroit-Vit C-Mn (GLUCOSAMINE-CHONDROITIN) CAPS Take 1 capsule by mouth daily.    . hydrochlorothiazide (MICROZIDE) 12.5 MG capsule TAKE 1 CAPSULE (12.5 MG TOTAL) BY MOUTH EVERY MORNING. (Patient taking differently: Take 12.5 mg by mouth daily. ) 90 capsule 3  . HYDROcodone-acetaminophen (NORCO/VICODIN) 5-325 MG tablet Take 1 tablet by mouth 2 (two) times daily. Pt takes one in the morning and one at bedtime. In between, may have one every 4 hours prn    . lisinopril (PRINIVIL,ZESTRIL) 40 MG tablet TAKE 1 TABLET (40 MG TOTAL) BY MOUTH DAILY. (Patient taking  differently: Take 40 mg by mouth daily. ) 90 tablet 3  . Multiple Vitamin (MULITIVITAMIN WITH MINERALS) TABS Take 1 tablet by mouth daily.    . polyethylene glycol (MIRALAX / GLYCOLAX) packet Take 17 g by mouth daily. (Patient taking differently: Take 17 g by mouth daily as needed for mild constipation. ) 14 each 0  . SYMBICORT 160-4.5 MCG/ACT inhaler Inhale 2 puffs into the lungs 2 (two) times daily.     No current facility-administered medications for this visit.    Allergies:   No Known Allergies  Social History:  The patient  reports that she has quit smoking. Her smoking use included Cigarettes. She has a 25 pack-year smoking history. She has never used smokeless tobacco. She reports that she does not drink alcohol or use illicit drugs.   Family history:   Family History  Problem Relation Age of Onset  . Breast cancer Mother   . Leukemia Father   . Stroke Sister   . Bone cancer      ROS:  Please see the history of present illness.  All other systems reviewed and negative.   PHYSICAL EXAM: VS:  BP 97/58 mmHg  Pulse 66  Ht 5' 2.5" (1.588 m)  Wt 120 lb (54.432 kg)  BMI 21.59 kg/m2 Well nourished, well developed, in no acute distress HEENT: Pupils are equal round react to light accommodation extraocular movements are intact.  Neck: no JVDNo cervical lymphadenopathy. Cardiac: Regular rate and rhythm without murmurs rubs or gallops. Lungs:  clear to auscultation bilaterally, no wheezing, rhonchi or rales Abd: soft, nontender, positive bowel sounds all quadrants, no hepatosplenomegaly Ext: no right lower extremity edema. The left leg is wrapped and has a posterior cast on.  2+ radial and 1+ dorsalis pedis pulses. Left foot is warm Skin: warm and dry Neuro:  Grossly normal   ASSESSMENT AND PLAN:  Problem List Items Addressed This Visit    TOBACCO ABUSE   Peripheral vascular disease (HCC)   Relevant Medications   aspirin 81 MG tablet   Hypotension   Relevant Medications    aspirin 81 MG tablet   Hyperlipidemia - Primary   Relevant Medications   aspirin 81 MG tablet   Essential hypertension   Relevant Medications   aspirin 81 MG tablet   Coronary atherosclerosis   Relevant Medications   aspirin 81 MG tablet   Cerebrovascular disease   Relevant Medications   aspirin 81 MG tablet      [name] appears to be having a rough go with her left knee surgery. She needs an third plastic surgery because of a poorly healing wound and infection.  Things seem to be turning around and she is improving. She has no particular cardiac complaints at this time. Carotid Dopplers were done last fall and were stable.  She need to repeat those next fall.  She's not had the ABIs since 2014 and her lower extremities. With a repeat those once the cast is removed. She is hypotensive. She has lost 20 pounds since her last office visit.   It looks like her Norvasc was decreased to 5 mg at some point after April 20. I will also discontinue the clonidine. She still at rehabilitation where they can monitor her blood pressure.  She's had a urinalysis at the lab prior to this visit. We'll review those results. She is on doxycycline already for her knee. She did quit smoking back on March 1. Continue Lipitor for cholesterol.Complaints of angina. She had a stress test a year ago which was nonischemic.

## 2015-12-28 NOTE — Patient Instructions (Signed)
Your physician has recommended you make the following change in your medication:   1.) STOP clonidine.  Your physician recommends that you schedule a follow-up appointment in: 3 months with Dr [name].

## 2015-12-29 ENCOUNTER — Telehealth: Payer: Self-pay | Admitting: Cardiology

## 2015-12-29 DIAGNOSIS — T8132XA Disruption of internal operation (surgical) wound, not elsewhere classified, initial encounter: Secondary | ICD-10-CM | POA: Diagnosis not present

## 2015-12-29 DIAGNOSIS — I739 Peripheral vascular disease, unspecified: Secondary | ICD-10-CM | POA: Diagnosis not present

## 2015-12-29 DIAGNOSIS — Z48817 Encounter for surgical aftercare following surgery on the skin and subcutaneous tissue: Secondary | ICD-10-CM | POA: Diagnosis not present

## 2015-12-29 DIAGNOSIS — J449 Chronic obstructive pulmonary disease, unspecified: Secondary | ICD-10-CM | POA: Diagnosis not present

## 2015-12-29 DIAGNOSIS — I10 Essential (primary) hypertension: Secondary | ICD-10-CM | POA: Diagnosis not present

## 2015-12-29 DIAGNOSIS — T8454XD Infection and inflammatory reaction due to internal left knee prosthesis, subsequent encounter: Secondary | ICD-10-CM | POA: Diagnosis not present

## 2015-12-29 NOTE — Telephone Encounter (Signed)
New message    The son wants to now if the lab work came from the pt test yesterday urinalysis that was done if anything has came back.

## 2015-12-29 NOTE — Telephone Encounter (Signed)
Spoke with pt son, he was made aware there is reference to urinalysis in the office note but there is no order in the system. I tried to call [facility] but they are closed. Advised son if she is in a lot of discomfort to take her to urgent care for ua, I will call [facility] in the morning but doubt urine can be used at this point. Son was frustrated but agreed with this plan.

## 2015-12-30 NOTE — Telephone Encounter (Signed)
Spoke with pt son, aware no order was ever placed and the urine has been thrown out. Advised son pt will need to go to urgent care if urine needed.

## 2016-01-02 ENCOUNTER — Other Ambulatory Visit: Payer: Self-pay | Admitting: Plastic Surgery

## 2016-01-02 DIAGNOSIS — S81002A Unspecified open wound, left knee, initial encounter: Secondary | ICD-10-CM

## 2016-01-02 NOTE — H&P (Signed)
[name] is an 75 y.o. female.   Chief Complaint: left knee wound HPI: The patient is a 75 yo female who is here for further care of her left knee wound.  She underwent a left lateral gastroc flap to the open knee wound 11/16/15. The patient had difficulty keeping the knee straight, even in a knee immobilizer and a portion of the sutures came loose. The graft was repositioned in the OR on 12/08/15 and more Acell was applied at this time and the [facility] dressing was applied. She is living with her son now and overall seems to be doing better with diet and vitamins.  A portion of the skin graft is well healed.  There remaining portion is granulating well.  History:  She underwent a left knee replacement in March 2017. It got infected and opened up ~ 2 times.    Past Medical History  Diagnosis Date  . CAD (coronary artery disease)   . HTN (hypertension)   . Hyperlipidemia   . Cerebrovascular disease   . Arthritis   . PVD (peripheral vascular disease) (HCC)     dvt in leg  . MRSA (methicillin resistant staph aureus) culture positive   . Wears glasses   . Heart murmur   . History of bronchitis   . Memory impairment     Past Surgical History  Procedure Laterality Date  . Knee surgery      denies  . Abdominal hysterectomy    . Total hip arthroplasty Right 09/10/2012    Dr Sharol Given  . Total hip arthroplasty Right 09/10/2012    Procedure: TOTAL HIP ARTHROPLASTY;  Surgeon: Newt Minion, MD;  Location: [place];  Service: Orthopedics;  Laterality: Right;  Right Total Hip Arthroplasty  . Coronary angioplasty  07    3 stents placed  . Total knee arthroplasty Left 09/28/2015    Procedure: TOTAL KNEE ARTHROPLASTY;  Surgeon: Newt Minion, MD;  Location: [place];  Service: Orthopedics;  Laterality: Left;  . I&d knee with poly exchange Left 10/12/2015    Procedure: Irrigation and Debridement , Poly Exchange, Antibiotic Beads Left Knee;  Surgeon: Newt Minion, MD;  Location: [place];  Service: Orthopedics;   Laterality: Left;  . I&d extremity Left 10/18/2015    Procedure: Left Knee Washout, Reclosure;  Surgeon: Newt Minion, MD;  Location: [place];  Service: Orthopedics;  Laterality: Left;  [name] Eye surgery Bilateral     cataracts  . Colonoscopy    . Esophagogastroduodenoscopy    . Skin split graft Left 11/16/2015    Procedure: LEFT GASTROC MUSCLE FLAP WITH SKIN GRAFT SPLIT THICKNESS AND VAC PLACEMENT;  Surgeon: Loel Lofty Aurilla Coulibaly, DO;  Location: [place];  Service: Plastics;  Laterality: Left;  . Application of wound vac Left 11/16/2015    Procedure: APPLICATION OF WOUND VAC;  Surgeon: Loel Lofty Jasamine Pottinger, DO;  Location: [place];  Service: Plastics;  Laterality: Left;  . Free flap to extremity Left 11/16/2015    Procedure: FREE FLAP TO EXTREMITY;  Surgeon: Loel Lofty Keller Mikels, DO;  Location: [place];  Service: Plastics;  Laterality: Left;  . I&d extremity Left 12/08/2015    Procedure: IRRIGATION AND DEBRIDEMENT knee;  Surgeon: Loel Lofty Fisher Hargadon, DO;  Location: [place];  Service: Plastics;  Laterality: Left;  . Application of a-cell of extremity Left 12/08/2015    Procedure: APPLICATION OF A-CELL OF knee;  Surgeon: Loel Lofty Mikaelah Trostle, DO;  Location: [place];  Service: Plastics;  Laterality: Left;  .  Application of wound vac Left 12/08/2015    Procedure: APPLICATION OF WOUND VAC  AND CAST to knee;  Surgeon: Loel Lofty Keyaan Lederman, DO;  Location: [place];  Service: Plastics;  Laterality: Left;    Family History  Problem Relation Age of Onset  . Breast cancer Mother   . Leukemia Father   . Stroke Sister   . Bone cancer     Social History:  reports that she has quit smoking. Her smoking use included Cigarettes. She has a 25 pack-year smoking history. She has never used smokeless tobacco. She reports that she does not drink alcohol or use illicit drugs.  Allergies: No Known Allergies   (Not in a hospital admission)  No results found for this or any previous visit (from the past 48 hour(s)). No results  found.  Review of Systems  Constitutional: Negative.   HENT: Negative.   Eyes: Negative.   Respiratory: Negative.   Cardiovascular: Negative.   Gastrointestinal: Negative.   Genitourinary: Negative.   Musculoskeletal: Negative.   Skin: Negative.   Neurological: Negative.   Psychiatric/Behavioral: Negative.     There were no vitals taken for this visit. Physical Exam  Constitutional: She is oriented to person, place, and time. She appears well-developed.  HENT:  Head: Normocephalic and atraumatic.  Eyes: EOM are normal. Pupils are equal, round, and reactive to light.  Cardiovascular: Normal rate.   Respiratory: Effort normal. No respiratory distress.  GI: Soft. She exhibits no distension.  Musculoskeletal:       Legs: Neurological: She is alert and oriented to person, place, and time.  Skin: Skin is warm.  Psychiatric: She has a normal mood and affect. Her behavior is normal.     Assessment/Plan Plan for Acell and skin graft to the right knee if possible.  May apply the [facility] as well.  Wallace Going, DO 01/02/2016, 7:49 PM

## 2016-01-03 ENCOUNTER — Encounter (HOSPITAL_COMMUNITY): Payer: Self-pay | Admitting: *Deleted

## 2016-01-03 NOTE — Progress Notes (Signed)
SDW-pre-op call completed by pt Son, [name]. [name] made aware to stop administering  Aspirin, vitamins, fish oil and herbal medications such as Glucosamine . Do not give pt any NSAIDs ie: Ibuprofen, Advil, Naproxen, BC and Goody Powder or Voltaren. [name] stated that pt was instructed to take last dose of Lovenox on Sunday and last dose of Aspirin yesterday, Monday. [name] verbalized understanding of all pre-op instructions.

## 2016-01-04 ENCOUNTER — Ambulatory Visit (HOSPITAL_COMMUNITY)
Admission: RE | Admit: 2016-01-04 | Discharge: 2016-01-04 | Disposition: A | Discharge status: Home / Self Care | Payer: Medicare Other | Source: Ambulatory Visit | Attending: Plastic Surgery | Admitting: Plastic Surgery

## 2016-01-04 ENCOUNTER — Ambulatory Visit (HOSPITAL_COMMUNITY): Payer: Medicare Other | Admitting: Certified Registered Nurse Anesthetist

## 2016-01-04 ENCOUNTER — Encounter (HOSPITAL_COMMUNITY): Payer: Self-pay | Admitting: Plastic Surgery

## 2016-01-04 ENCOUNTER — Encounter (HOSPITAL_COMMUNITY)
Admission: RE | Disposition: A | Discharge status: Home / Self Care | Payer: Self-pay | Source: Ambulatory Visit | Attending: Plastic Surgery

## 2016-01-04 DIAGNOSIS — Z955 Presence of coronary angioplasty implant and graft: Secondary | ICD-10-CM | POA: Insufficient documentation

## 2016-01-04 DIAGNOSIS — E785 Hyperlipidemia, unspecified: Secondary | ICD-10-CM | POA: Insufficient documentation

## 2016-01-04 DIAGNOSIS — Z96652 Presence of left artificial knee joint: Secondary | ICD-10-CM | POA: Insufficient documentation

## 2016-01-04 DIAGNOSIS — T8489XA Other specified complication of internal orthopedic prosthetic devices, implants and grafts, initial encounter: Secondary | ICD-10-CM | POA: Insufficient documentation

## 2016-01-04 DIAGNOSIS — S81002A Unspecified open wound, left knee, initial encounter: Secondary | ICD-10-CM

## 2016-01-04 DIAGNOSIS — M1990 Unspecified osteoarthritis, unspecified site: Secondary | ICD-10-CM | POA: Insufficient documentation

## 2016-01-04 DIAGNOSIS — I10 Essential (primary) hypertension: Secondary | ICD-10-CM | POA: Diagnosis not present

## 2016-01-04 DIAGNOSIS — S81002D Unspecified open wound, left knee, subsequent encounter: Secondary | ICD-10-CM | POA: Diagnosis not present

## 2016-01-04 DIAGNOSIS — I739 Peripheral vascular disease, unspecified: Secondary | ICD-10-CM | POA: Diagnosis not present

## 2016-01-04 DIAGNOSIS — Z87891 Personal history of nicotine dependence: Secondary | ICD-10-CM | POA: Diagnosis not present

## 2016-01-04 DIAGNOSIS — I2510 Atherosclerotic heart disease of native coronary artery without angina pectoris: Secondary | ICD-10-CM | POA: Diagnosis not present

## 2016-01-04 DIAGNOSIS — J449 Chronic obstructive pulmonary disease, unspecified: Secondary | ICD-10-CM | POA: Insufficient documentation

## 2016-01-04 DIAGNOSIS — G8918 Other acute postprocedural pain: Secondary | ICD-10-CM | POA: Diagnosis not present

## 2016-01-04 DIAGNOSIS — Y838 Other surgical procedures as the cause of abnormal reaction of the patient, or of later complication, without mention of misadventure at the time of the procedure: Secondary | ICD-10-CM | POA: Insufficient documentation

## 2016-01-04 HISTORY — PX: INCISION AND DRAINAGE OF WOUND: SHX1803

## 2016-01-04 HISTORY — DX: Adverse effect of unspecified anesthetic, initial encounter: T41.45XA

## 2016-01-04 HISTORY — DX: Other complications of anesthesia, initial encounter: T88.59XA

## 2016-01-04 LAB — BASIC METABOLIC PANEL
Anion gap: 11 (ref 5–15)
BUN: 28 mg/dL — AB (ref 6–20)
CHLORIDE: 106 mmol/L (ref 101–111)
CO2: 21 mmol/L — AB (ref 22–32)
CREATININE: 0.9 mg/dL (ref 0.44–1.00)
Calcium: 10 mg/dL (ref 8.9–10.3)
GFR calc Af Amer: >60 mL/min (ref >60)
GFR calc non Af Amer: >60 mL/min (ref >60)
Glucose, Bld: 107 mg/dL — ABNORMAL HIGH (ref 65–99)
Potassium: 4.6 mmol/L (ref 3.5–5.1)
Sodium: 138 mmol/L (ref 135–145)

## 2016-01-04 LAB — CBC
HEMATOCRIT: 30.2 % — AB (ref 36.0–46.0)
HEMOGLOBIN: 10 g/dL — AB (ref 12.0–15.0)
MCH: 31.2 pg (ref 26.0–34.0)
MCHC: 33.1 g/dL (ref 30.0–36.0)
MCV: 94.1 fL (ref 78.0–100.0)
Platelets: ADEQUATE 10*3/uL (ref 150–400)
RBC: 3.21 MIL/uL — ABNORMAL LOW (ref 3.87–5.11)
RDW: 20 % — ABNORMAL HIGH (ref 11.5–15.5)
WBC: 9.1 10*3/uL (ref 4.0–10.5)

## 2016-01-04 LAB — PROTIME-INR
INR: 1.15 (ref 0.00–1.49)
Prothrombin Time: 14.8 seconds (ref 11.6–15.2)

## 2016-01-04 SURGERY — IRRIGATION AND DEBRIDEMENT WOUND
Anesthesia: General | Site: Knee | Laterality: Left

## 2016-01-04 MED ORDER — FENTANYL CITRATE (PF) 250 MCG/5ML IJ SOLN
INTRAMUSCULAR | Status: DC | PRN
  Administered 2016-01-04 (×2): 25 ug via INTRAVENOUS

## 2016-01-04 MED ORDER — BUPIVACAINE-EPINEPHRINE (PF) 0.25% -1:200000 IJ SOLN
INTRAMUSCULAR | Status: AC
  Filled 2016-01-04: qty 30

## 2016-01-04 MED ORDER — PHENYLEPHRINE HCL 10 MG/ML IJ SOLN
INTRAMUSCULAR | Status: DC | PRN
  Administered 2016-01-04 (×4): 80 ug via INTRAVENOUS

## 2016-01-04 MED ORDER — MIDAZOLAM HCL 2 MG/2ML IJ SOLN
INTRAMUSCULAR | Status: AC
  Filled 2016-01-04: qty 2

## 2016-01-04 MED ORDER — SODIUM CHLORIDE 0.9 % IR SOLN
Status: DC | PRN
  Administered 2016-01-04: 500 mL

## 2016-01-04 MED ORDER — PHENYLEPHRINE 40 MCG/ML (10ML) SYRINGE FOR IV PUSH (FOR BLOOD PRESSURE SUPPORT)
PREFILLED_SYRINGE | INTRAVENOUS | Status: AC
  Filled 2016-01-04: qty 10

## 2016-01-04 MED ORDER — BUPIVACAINE-EPINEPHRINE (PF) 0.5% -1:200000 IJ SOLN
INTRAMUSCULAR | Status: DC | PRN
  Administered 2016-01-04: 30 mL via PERINEURAL

## 2016-01-04 MED ORDER — DEXMEDETOMIDINE HCL IN NACL 200 MCG/50ML IV SOLN
INTRAVENOUS | Status: AC
  Filled 2016-01-04: qty 50

## 2016-01-04 MED ORDER — DEXMEDETOMIDINE HCL IN NACL 400 MCG/100ML IV SOLN
INTRAVENOUS | Status: DC | PRN
  Administered 2016-01-04: .5 ug/kg/h via INTRAVENOUS

## 2016-01-04 MED ORDER — LACTATED RINGERS IV SOLN
INTRAVENOUS | Status: DC
  Administered 2016-01-04: 12:00:00 via INTRAVENOUS

## 2016-01-04 MED ORDER — 0.9 % SODIUM CHLORIDE (POUR BTL) OPTIME
TOPICAL | Status: DC | PRN
  Administered 2016-01-04: 1000 mL

## 2016-01-04 MED ORDER — PROPOFOL 10 MG/ML IV BOLUS
INTRAVENOUS | Status: AC
  Filled 2016-01-04: qty 20

## 2016-01-04 MED ORDER — CEFAZOLIN SODIUM-DEXTROSE 2-4 GM/100ML-% IV SOLN
2.0000 g | INTRAVENOUS | Status: AC
  Administered 2016-01-04: 2 g via INTRAVENOUS
  Filled 2016-01-04: qty 100

## 2016-01-04 MED ORDER — FENTANYL CITRATE (PF) 250 MCG/5ML IJ SOLN
INTRAMUSCULAR | Status: AC
  Filled 2016-01-04: qty 5

## 2016-01-04 MED ORDER — PROPOFOL 500 MG/50ML IV EMUL
INTRAVENOUS | Status: DC | PRN
  Administered 2016-01-04: 30 ug/kg/min via INTRAVENOUS

## 2016-01-04 SURGICAL SUPPLY — 63 items
APL SKNCLS STERI-STRIP NONHPOA (GAUZE/BANDAGES/DRESSINGS)
APPLICATOR COTTON TIP 6IN STRL (MISCELLANEOUS) ×2 IMPLANT
BAG DECANTER FOR FLEXI CONT (MISCELLANEOUS) IMPLANT
BANDAGE ACE 4X5 VEL STRL LF (GAUZE/BANDAGES/DRESSINGS) ×6 IMPLANT
BENZOIN TINCTURE PRP APPL 2/3 (GAUZE/BANDAGES/DRESSINGS) ×1 IMPLANT
BLADE SURG 10 STRL SS (BLADE) ×2 IMPLANT
BNDG GAUZE ELAST 4 BULKY (GAUZE/BANDAGES/DRESSINGS) ×4 IMPLANT
CANISTER SUCTION 2500CC (MISCELLANEOUS) ×3 IMPLANT
CONT SPEC 4OZ CLIKSEAL STRL BL (MISCELLANEOUS) ×2 IMPLANT
CONT SPEC STER OR (MISCELLANEOUS) IMPLANT
COVER SURGICAL LIGHT HANDLE (MISCELLANEOUS) ×3 IMPLANT
DRAPE IMP U-DRAPE 54X76 (DRAPES) ×1 IMPLANT
DRAPE INCISE IOBAN 66X45 STRL (DRAPES) ×2 IMPLANT
DRAPE LAPAROSCOPIC ABDOMINAL (DRAPES) IMPLANT
DRAPE ORTHO SPLIT 77X108 STRL (DRAPES) ×6
DRAPE PED LAPAROTOMY (DRAPES) ×1 IMPLANT
DRAPE PROXIMA HALF (DRAPES) ×3 IMPLANT
DRAPE SURG ORHT 6 SPLT 77X108 (DRAPES) IMPLANT
DRESSING HYDROCOLLOID 4X4 (GAUZE/BANDAGES/DRESSINGS) IMPLANT
DRESSING HYDROCOLLOID 4X4 XTH (GAUZE/BANDAGES/DRESSINGS) ×3 IMPLANT
DRSG ADAPTIC 3X8 NADH LF (GAUZE/BANDAGES/DRESSINGS) IMPLANT
DRSG CUTIMED SORBACT 7X9 (GAUZE/BANDAGES/DRESSINGS) ×2 IMPLANT
DRSG PAD ABDOMINAL 8X10 ST (GAUZE/BANDAGES/DRESSINGS) IMPLANT
DRSG VAC ATS LRG SENSATRAC (GAUZE/BANDAGES/DRESSINGS) IMPLANT
DRSG VAC ATS MED SENSATRAC (GAUZE/BANDAGES/DRESSINGS) IMPLANT
DRSG VAC ATS SM SENSATRAC (GAUZE/BANDAGES/DRESSINGS) IMPLANT
ELECT CAUTERY BLADE 6.4 (BLADE) ×4 IMPLANT
ELECT REM PT RETURN 9FT ADLT (ELECTROSURGICAL) ×3
ELECTRODE REM PT RTRN 9FT ADLT (ELECTROSURGICAL) ×1 IMPLANT
GAUZE SPONGE 4X4 12PLY STRL (GAUZE/BANDAGES/DRESSINGS) IMPLANT
GLOVE BIO SURGEON STRL SZ 6.5 (GLOVE) ×3 IMPLANT
GLOVE BIO SURGEON STRL SZ7 (GLOVE) ×3 IMPLANT
GLOVE BIO SURGEONS STRL SZ 6.5 (GLOVE) ×2
GLOVE BIOGEL PI IND STRL 7.0 (GLOVE) IMPLANT
GLOVE BIOGEL PI INDICATOR 7.0 (GLOVE) ×4
GLOVE SURG SS PI 6.5 STRL IVOR (GLOVE) ×2 IMPLANT
GLOVE SURG SS PI 7.0 STRL IVOR (GLOVE) ×4 IMPLANT
GOWN STRL REUS W/ TWL LRG LVL3 (GOWN DISPOSABLE) ×3 IMPLANT
GOWN STRL REUS W/TWL LRG LVL3 (GOWN DISPOSABLE) ×12
IMMOBILIZER KNEE 20 (SOFTGOODS) ×3
IMMOBILIZER KNEE 20 THIGH 36 (SOFTGOODS) IMPLANT
KIT BASIN OR (CUSTOM PROCEDURE TRAY) ×3 IMPLANT
KIT ROOM TURNOVER OR (KITS) ×3 IMPLANT
MATRIX SURGICAL PSM 5X5CM (Tissue) ×2 IMPLANT
MICROMATRIX 1000MG (Tissue) ×3 IMPLANT
NS IRRIG 1000ML POUR BTL (IV SOLUTION) ×3 IMPLANT
PACK GENERAL/GYN (CUSTOM PROCEDURE TRAY) ×3 IMPLANT
PACK UNIVERSAL I (CUSTOM PROCEDURE TRAY) ×1 IMPLANT
PAD ARMBOARD 7.5X6 YLW CONV (MISCELLANEOUS) ×6 IMPLANT
SCRUB POVIDONE IODINE 4 OZ (MISCELLANEOUS) ×2 IMPLANT
SOL PREP POV-IOD 4OZ 10% (MISCELLANEOUS) ×2 IMPLANT
SOLUTION PARTIC MCRMTRX 1000MG (Tissue) IMPLANT
SPLINT FIBERGLASS 4X30 (CAST SUPPLIES) ×3 IMPLANT
STAPLER VISISTAT 35W (STAPLE) ×1 IMPLANT
SURGILUBE 2OZ TUBE FLIPTOP (MISCELLANEOUS) IMPLANT
SUT SILK 4 0 PS 2 (SUTURE) ×3 IMPLANT
SUT VIC AB 5-0 P-3 18XBRD (SUTURE) ×2 IMPLANT
SUT VIC AB 5-0 P3 18 (SUTURE) ×6
SUT VIC AB 5-0 PS2 18 (SUTURE) IMPLANT
SWAB COLLECTION DEVICE MRSA (MISCELLANEOUS) IMPLANT
TOWEL OR 17X26 10 PK STRL BLUE (TOWEL DISPOSABLE) ×3 IMPLANT
TUBE ANAEROBIC SPECIMEN COL (MISCELLANEOUS) IMPLANT
UNDERPAD 30X30 INCONTINENT (UNDERPADS AND DIAPERS) ×3 IMPLANT

## 2016-01-04 NOTE — Discharge Instructions (Signed)
Continue VAC Change VAC weekly

## 2016-01-04 NOTE — Brief Op Note (Signed)
01/04/2016  1:44 PM  PATIENT:  [name]  75 y.o. female  PRE-OPERATIVE DIAGNOSIS:  LEFT KNEE WOUND  POST-OPERATIVE DIAGNOSIS:  LEFT KNEE WOUND  PROCEDURE:  Procedure(s): DEBRIDEMENT OF LEFT KNEE WOUND WITH ACELL AND WOUND VAC PLACEMENT (Left)  SURGEON:  Surgeon(s) and Role:    * Claire S [name], DO - Primary  PHYSICIAN ASSISTANT: Shawn Rayburn, PA  ASSISTANTS: none   ANESTHESIA:   block  EBL:  Total I/O In: -  Out: 5 [Blood:5]  BLOOD ADMINISTERED:none  DRAINS: none   LOCAL MEDICATIONS USED:  NONE  SPECIMEN:  No Specimen  DISPOSITION OF SPECIMEN:  N/A  COUNTS:  YES  TOURNIQUET:  * No tourniquets in log *  DICTATION: .Dragon Dictation  PLAN OF CARE: Discharge to home after PACU  PATIENT DISPOSITION:  PACU - hemodynamically stable.   Delay start of Pharmacological VTE agent (>24hrs) due to surgical blood loss or risk of bleeding: no

## 2016-01-04 NOTE — Interval H&P Note (Signed)
History and Physical Interval Note:  01/04/2016 10:50 AM  [name]  has presented today for surgery, with the diagnosis of LEFT KNEE WOUND  The various methods of treatment have been discussed with the patient and family. After consideration of risks, benefits and other options for treatment, the patient has consented to  Procedure(s): DEBRIDEMENT OF LEFT KNEE WOUND WITH ACELL AND VAC (Left) as a surgical intervention .  The patient's history has been reviewed, patient examined, no change in status, stable for surgery.  I have reviewed the patient's chart and labs.  Questions were answered to the patient's satisfaction.     Wallace Going

## 2016-01-04 NOTE — Anesthesia Preprocedure Evaluation (Signed)
Anesthesia Evaluation  Patient identified by MRN, date of birth, ID band Patient awake    Reviewed: Allergy & Precautions, H&P , NPO status , Patient's Chart, lab work & pertinent test results, reviewed documented beta blocker date and time   History of Anesthesia Complications (+) history of anesthetic complications  Airway Mallampati: II  TM Distance: >3 FB Neck ROM: Full    Dental no notable dental hx. (+) Upper Dentures, Lower Dentures, Dental Advisory Given   Pulmonary neg pulmonary ROS, COPD, Current Smoker, former smoker,    Pulmonary exam normal breath sounds clear to auscultation       Cardiovascular hypertension, Pt. on medications and Pt. on home beta blockers + CAD, + Cardiac Stents and + Peripheral Vascular Disease   Rhythm:Regular Rate:Normal  EF preserved   Neuro/Psych negative neurological ROS  negative psych ROS   GI/Hepatic negative GI ROS, Neg liver ROS,   Endo/Other  negative endocrine ROS  Renal/GU negative Renal ROS  negative genitourinary   Musculoskeletal  (+) Arthritis , Osteoarthritis,    Abdominal   Peds  Hematology negative hematology ROS (+)   Anesthesia Other Findings   Reproductive/Obstetrics negative OB ROS                             Anesthesia Physical  Anesthesia Plan  ASA: III  Anesthesia Plan: MAC and Regional   Post-op Pain Management:    Induction: Intravenous  Airway Management Planned: Natural Airway, Nasal Cannula and Simple Face Mask  Additional Equipment: None  Intra-op Plan:   Post-operative Plan:   Informed Consent: I have reviewed the patients History and Physical, chart, labs and discussed the procedure including the risks, benefits and alternatives for the proposed anesthesia with the patient or authorized representative who has indicated his/her understanding and acceptance.   Dental advisory given  Plan Discussed with:  CRNA and Surgeon  Anesthesia Plan Comments: (Will use femoral nerve block for intraop surgical analgesia given patient and family significant concern with post op cognitive delirium which has resulted each time following her procedures for her knee (I and Ds). We will avoid benzos, long acting pain medication and use precedex, propofol, and fentanyl primarily in addition to femoral nerve block. I have answered all their questions and concerns regarding post op cognitive decline and they realize that back up GA with an LMA is always an option for safety and patient comfort. )        Anesthesia Quick Evaluation

## 2016-01-04 NOTE — Transfer of Care (Signed)
Immediate Anesthesia Transfer of Care Note  Patient: [name]  Procedure(s) Performed: Procedure(s): DEBRIDEMENT OF LEFT KNEE WOUND WITH ACELL AND WOUND VAC PLACEMENT (Left)  Patient Location: PACU  Anesthesia Type:MAC combined with regional for post-op pain  Level of Consciousness: awake, alert , oriented and patient cooperative  Airway & Oxygen Therapy: Patient connected to nasal cannula oxygen  Post-op Assessment: Report given to RN  Post vital signs: stable  Last Vitals:  Filed Vitals:   01/04/16 1125 01/04/16 1349  BP: 88/58 99/49  Pulse: 102   Temp: 37.2 C   Resp: 16 18    Last Pain:  Filed Vitals:   01/04/16 1350  PainSc: 0-No pain      Patients Stated Pain Goal: 2 (83/81/84 0375)  Complications: No apparent anesthesia complications

## 2016-01-04 NOTE — Anesthesia Postprocedure Evaluation (Signed)
Anesthesia Post Note  Patient: [name]  Procedure(s) Performed: Procedure(s) (LRB): DEBRIDEMENT OF LEFT KNEE WOUND WITH ACELL AND WOUND VAC PLACEMENT (Left)  Patient location during evaluation: PACU Anesthesia Type: Regional and MAC Level of consciousness: awake and alert Pain management: pain level controlled Vital Signs Assessment: post-procedure vital signs reviewed and stable Respiratory status: spontaneous breathing, nonlabored ventilation, respiratory function stable and patient connected to nasal cannula oxygen Cardiovascular status: blood pressure returned to baseline and stable Postop Assessment: no signs of nausea or vomiting Anesthetic complications: no    Last Vitals:  Filed Vitals:   01/04/16 1445 01/04/16 1500  BP: 124/49 126/61  Pulse: 90 88  Temp: 36.6 C   Resp: 18 18    Last Pain:  Filed Vitals:   01/04/16 1526  PainSc: 0-No pain                 Zenaida Deed

## 2016-01-04 NOTE — H&P (View-Only) (Signed)
[name] is an 75 y.o. female.   Chief Complaint: left knee wound HPI: The patient is a 75 yo female who is here for further care of her left knee wound.  She underwent a left lateral gastroc flap to the open knee wound 11/16/15. The patient had difficulty keeping the knee straight, even in a knee immobilizer and a portion of the sutures came loose. The graft was repositioned in the OR on 12/08/15 and more Acell was applied at this time and the [facility] dressing was applied. She is living with her son now and overall seems to be doing better with diet and vitamins.  A portion of the skin graft is well healed.  There remaining portion is granulating well.  History:  She underwent a left knee replacement in March 2017. It got infected and opened up ~ 2 times.    Past Medical History  Diagnosis Date  . CAD (coronary artery disease)   . HTN (hypertension)   . Hyperlipidemia   . Cerebrovascular disease   . Arthritis   . PVD (peripheral vascular disease) (HCC)     dvt in leg  . MRSA (methicillin resistant staph aureus) culture positive   . Wears glasses   . Heart murmur   . History of bronchitis   . Memory impairment     Past Surgical History  Procedure Laterality Date  . Knee surgery      denies  . Abdominal hysterectomy    . Total hip arthroplasty Right 09/10/2012    Dr [name]  . Total hip arthroplasty Right 09/10/2012    Procedure: TOTAL HIP ARTHROPLASTY;  Surgeon: Courtney Minion, MD;  Location: [place];  Service: Orthopedics;  Laterality: Right;  Right Total Hip Arthroplasty  . Coronary angioplasty  07    3 stents placed  . Total knee arthroplasty Left 09/28/2015    Procedure: TOTAL KNEE ARTHROPLASTY;  Surgeon: Courtney Minion, MD;  Location: [place];  Service: Orthopedics;  Laterality: Left;  . I&d knee with poly exchange Left 10/12/2015    Procedure: Irrigation and Debridement , Poly Exchange, Antibiotic Beads Left Knee;  Surgeon: Courtney Minion, MD;  Location: [place];  Service: Orthopedics;   Laterality: Left;  . I&d extremity Left 10/18/2015    Procedure: Left Knee Washout, Reclosure;  Surgeon: Courtney Minion, MD;  Location: [place];  Service: Orthopedics;  Laterality: Left;  [name] Eye surgery Bilateral     cataracts  . Colonoscopy    . Esophagogastroduodenoscopy    . Skin split graft Left 11/16/2015    Procedure: LEFT GASTROC MUSCLE FLAP WITH SKIN GRAFT SPLIT THICKNESS AND VAC PLACEMENT;  Surgeon: Courtney Lofty Raynard Mapps, DO;  Location: [place];  Service: Plastics;  Laterality: Left;  . Application of wound vac Left 11/16/2015    Procedure: APPLICATION OF WOUND VAC;  Surgeon: Courtney Lofty Jarman Litton, DO;  Location: [place];  Service: Plastics;  Laterality: Left;  . Free flap to extremity Left 11/16/2015    Procedure: FREE FLAP TO EXTREMITY;  Surgeon: Courtney Lofty Ladeja Pelham, DO;  Location: [place];  Service: Plastics;  Laterality: Left;  . I&d extremity Left 12/08/2015    Procedure: IRRIGATION AND DEBRIDEMENT knee;  Surgeon: Courtney Lofty Rosemond Lyttle, DO;  Location: [place];  Service: Plastics;  Laterality: Left;  . Application of a-cell of extremity Left 12/08/2015    Procedure: APPLICATION OF A-CELL OF knee;  Surgeon: Courtney Lofty Jimy Gates, DO;  Location: [place];  Service: Plastics;  Laterality: Left;  .  Application of wound vac Left 12/08/2015    Procedure: APPLICATION OF WOUND VAC  AND CAST to knee;  Surgeon: Courtney Lofty Cresencio Reesor, DO;  Location: [place];  Service: Plastics;  Laterality: Left;    Family History  Problem Relation Age of Onset  . Breast cancer Mother   . Leukemia Father   . Stroke Sister   . Bone cancer     Social History:  reports that she has quit smoking. Her smoking use included Cigarettes. She has a 25 pack-year smoking history. She has never used smokeless tobacco. She reports that she does not drink alcohol or use illicit drugs.  Allergies: No Known Allergies   (Not in a hospital admission)  No results found for this or any previous visit (from the past 48 hour(s)). No results  found.  Review of Systems  Constitutional: Negative.   HENT: Negative.   Eyes: Negative.   Respiratory: Negative.   Cardiovascular: Negative.   Gastrointestinal: Negative.   Genitourinary: Negative.   Musculoskeletal: Negative.   Skin: Negative.   Neurological: Negative.   Psychiatric/Behavioral: Negative.     There were no vitals taken for this visit. Physical Exam  Constitutional: She is oriented to person, place, and time. She appears well-developed.  HENT:  Head: Normocephalic and atraumatic.  Eyes: EOM are normal. Pupils are equal, round, and reactive to light.  Cardiovascular: Normal rate.   Respiratory: Effort normal. No respiratory distress.  GI: Soft. She exhibits no distension.  Musculoskeletal:       Legs: Neurological: She is alert and oriented to person, place, and time.  Skin: Skin is warm.  Psychiatric: She has a normal mood and affect. Her behavior is normal.     Assessment/Plan Plan for Acell and skin graft to the right knee if possible.  May apply the [facility] as well.  Courtney Going, DO 01/02/2016, 7:49 PM

## 2016-01-04 NOTE — Anesthesia Procedure Notes (Signed)
Anesthesia Regional Block:  Femoral nerve block  Pre-Anesthetic Checklist: ,, timeout performed, Correct Patient, Correct Site, Correct Laterality, Correct Procedure, Correct Position, site marked, Risks and benefits discussed,  Surgical consent,  Pre-op evaluation,  At surgeon's request and post-op pain management  Laterality: Left  Prep: Maximum Sterile Barrier Precautions used and chloraprep       Needles:  Injection technique: Single-shot  Needle Type: Stimiplex     Needle Length: 10cm 10 cm Needle Gauge: 21 and 21 G    Additional Needles:  Procedures: ultrasound guided (picture in chart) Femoral nerve block Narrative:  Injection made incrementally with aspirations every 5 mL.  Performed by: Personally  Anesthesiologist: Aubreyana Saltz  Additional Notes: Patient tolerated the procedure well without complications

## 2016-01-04 NOTE — Op Note (Signed)
Operative Note   DATE OF OPERATION: 01/04/2016  LOCATION: [facility]  SURGICAL DIVISION: Plastic Surgery  PREOPERATIVE DIAGNOSES:  Left knee wound  5 x 5 cm  POSTOPERATIVE DIAGNOSES:  same  PROCEDURE:  Preparation of left knee wound for placement of Acell (powder 1 gm and sheet 5 x 5 cm) and VAC  SURGEON: Claire Sanger [name], DO  ASSISTANT: Shawn Rayburn, PA  ANESTHESIA:  General.   COMPLICATIONS: None.   INDICATIONS FOR PROCEDURE:  The patient, [name] is a 75 y.o. female born on 09-02-1940, is here for treatment of a left knee wound.  She had a knee replacement with exposed hardware.  She underwent a gastrocnemius muscle flap.  She started to bend her knee and ripped the muscle loose.  She presents for further care. MRN: [id number]  CONSENT:  Informed consent was obtained directly from the patient. Risks, benefits and alternatives were fully discussed. Specific risks including but not limited to bleeding, infection, hematoma, seroma, scarring, pain, infection, contracture, asymmetry, wound healing problems, and need for further surgery were all discussed. The patient did have an ample opportunity to have questions answered to satisfaction.   DESCRIPTION OF PROCEDURE:  The patient was taken to the operating room. SCDs were placed and IV antibiotics were given. The patient's operative site was prepped and draped in a sterile fashion. A time out was performed and all information was confirmed to be correct.  Anesthesia was administered.  The knee was irrigated with antibiotic solution and saline.  Hemostasis was achieved with electrocautery.  There was a tunnel under the bride of skin that survived.  The #10 blade was used to debride hypergranulation tissue at the superior and inferior aspect of the wound.  All the Acell powder and sheet were applied to the 5 x 5 cm wound and secured with 5-0 Vicryl.  The sorbact was placed with [place] and the VAC. The patient  tolerated the procedure well.  A plantar blocking splint was applied to prevent knee bending.  There were no complications. The patient was allowed to wake from anesthesia, extubated and taken to the recovery room in satisfactory condition.

## 2016-01-05 ENCOUNTER — Encounter (HOSPITAL_COMMUNITY): Payer: Self-pay | Admitting: Plastic Surgery

## 2016-01-11 DIAGNOSIS — T8454XD Infection and inflammatory reaction due to internal left knee prosthesis, subsequent encounter: Secondary | ICD-10-CM | POA: Diagnosis not present

## 2016-01-11 DIAGNOSIS — Z48817 Encounter for surgical aftercare following surgery on the skin and subcutaneous tissue: Secondary | ICD-10-CM | POA: Diagnosis not present

## 2016-01-11 DIAGNOSIS — I10 Essential (primary) hypertension: Secondary | ICD-10-CM | POA: Diagnosis not present

## 2016-01-11 DIAGNOSIS — J449 Chronic obstructive pulmonary disease, unspecified: Secondary | ICD-10-CM | POA: Diagnosis not present

## 2016-01-11 DIAGNOSIS — I739 Peripheral vascular disease, unspecified: Secondary | ICD-10-CM | POA: Diagnosis not present

## 2016-01-11 DIAGNOSIS — T8132XA Disruption of internal operation (surgical) wound, not elsewhere classified, initial encounter: Secondary | ICD-10-CM | POA: Diagnosis not present

## 2016-01-12 DIAGNOSIS — T8454XD Infection and inflammatory reaction due to internal left knee prosthesis, subsequent encounter: Secondary | ICD-10-CM | POA: Diagnosis not present

## 2016-01-12 DIAGNOSIS — J449 Chronic obstructive pulmonary disease, unspecified: Secondary | ICD-10-CM | POA: Diagnosis not present

## 2016-01-12 DIAGNOSIS — I10 Essential (primary) hypertension: Secondary | ICD-10-CM | POA: Diagnosis not present

## 2016-01-12 DIAGNOSIS — Z48817 Encounter for surgical aftercare following surgery on the skin and subcutaneous tissue: Secondary | ICD-10-CM | POA: Diagnosis not present

## 2016-01-12 DIAGNOSIS — T8132XA Disruption of internal operation (surgical) wound, not elsewhere classified, initial encounter: Secondary | ICD-10-CM | POA: Diagnosis not present

## 2016-01-12 DIAGNOSIS — I739 Peripheral vascular disease, unspecified: Secondary | ICD-10-CM | POA: Diagnosis not present

## 2016-01-17 DIAGNOSIS — Z48817 Encounter for surgical aftercare following surgery on the skin and subcutaneous tissue: Secondary | ICD-10-CM | POA: Diagnosis not present

## 2016-01-17 DIAGNOSIS — T8454XD Infection and inflammatory reaction due to internal left knee prosthesis, subsequent encounter: Secondary | ICD-10-CM | POA: Diagnosis not present

## 2016-01-17 DIAGNOSIS — J449 Chronic obstructive pulmonary disease, unspecified: Secondary | ICD-10-CM | POA: Diagnosis not present

## 2016-01-17 DIAGNOSIS — I739 Peripheral vascular disease, unspecified: Secondary | ICD-10-CM | POA: Diagnosis not present

## 2016-01-17 DIAGNOSIS — T8132XA Disruption of internal operation (surgical) wound, not elsewhere classified, initial encounter: Secondary | ICD-10-CM | POA: Diagnosis not present

## 2016-01-17 DIAGNOSIS — I10 Essential (primary) hypertension: Secondary | ICD-10-CM | POA: Diagnosis not present

## 2016-01-19 DIAGNOSIS — J449 Chronic obstructive pulmonary disease, unspecified: Secondary | ICD-10-CM | POA: Diagnosis not present

## 2016-01-19 DIAGNOSIS — T8454XD Infection and inflammatory reaction due to internal left knee prosthesis, subsequent encounter: Secondary | ICD-10-CM | POA: Diagnosis not present

## 2016-01-19 DIAGNOSIS — Z48817 Encounter for surgical aftercare following surgery on the skin and subcutaneous tissue: Secondary | ICD-10-CM | POA: Diagnosis not present

## 2016-01-19 DIAGNOSIS — I739 Peripheral vascular disease, unspecified: Secondary | ICD-10-CM | POA: Diagnosis not present

## 2016-01-19 DIAGNOSIS — I10 Essential (primary) hypertension: Secondary | ICD-10-CM | POA: Diagnosis not present

## 2016-01-19 DIAGNOSIS — T8132XA Disruption of internal operation (surgical) wound, not elsewhere classified, initial encounter: Secondary | ICD-10-CM | POA: Diagnosis not present

## 2016-01-20 ENCOUNTER — Other Ambulatory Visit: Payer: Self-pay

## 2016-01-20 ENCOUNTER — Inpatient Hospital Stay (HOSPITAL_COMMUNITY)
Admission: EM | Admit: 2016-01-20 | Discharge: 2016-02-01 | DRG: 871 | Disposition: A | Discharge status: Skilled Nursing Facility | Payer: Medicare Other | Attending: Internal Medicine | Admitting: Internal Medicine

## 2016-01-20 ENCOUNTER — Emergency Department (HOSPITAL_COMMUNITY): Payer: Medicare Other

## 2016-01-20 ENCOUNTER — Encounter (HOSPITAL_COMMUNITY): Payer: Self-pay | Admitting: *Deleted

## 2016-01-20 DIAGNOSIS — D649 Anemia, unspecified: Secondary | ICD-10-CM | POA: Diagnosis not present

## 2016-01-20 DIAGNOSIS — E785 Hyperlipidemia, unspecified: Secondary | ICD-10-CM | POA: Diagnosis present

## 2016-01-20 DIAGNOSIS — Z7951 Long term (current) use of inhaled steroids: Secondary | ICD-10-CM

## 2016-01-20 DIAGNOSIS — R931 Abnormal findings on diagnostic imaging of heart and coronary circulation: Secondary | ICD-10-CM | POA: Diagnosis present

## 2016-01-20 DIAGNOSIS — R6520 Severe sepsis without septic shock: Secondary | ICD-10-CM | POA: Diagnosis not present

## 2016-01-20 DIAGNOSIS — I110 Hypertensive heart disease with heart failure: Secondary | ICD-10-CM | POA: Diagnosis present

## 2016-01-20 DIAGNOSIS — L89622 Pressure ulcer of left heel, stage 2: Secondary | ICD-10-CM | POA: Diagnosis present

## 2016-01-20 DIAGNOSIS — Z96641 Presence of right artificial hip joint: Secondary | ICD-10-CM | POA: Diagnosis present

## 2016-01-20 DIAGNOSIS — R34 Anuria and oliguria: Secondary | ICD-10-CM | POA: Diagnosis not present

## 2016-01-20 DIAGNOSIS — N139 Obstructive and reflux uropathy, unspecified: Secondary | ICD-10-CM | POA: Insufficient documentation

## 2016-01-20 DIAGNOSIS — I739 Peripheral vascular disease, unspecified: Secondary | ICD-10-CM | POA: Diagnosis present

## 2016-01-20 DIAGNOSIS — R9439 Abnormal result of other cardiovascular function study: Secondary | ICD-10-CM | POA: Diagnosis not present

## 2016-01-20 DIAGNOSIS — R32 Unspecified urinary incontinence: Secondary | ICD-10-CM | POA: Diagnosis present

## 2016-01-20 DIAGNOSIS — Z436 Encounter for attention to other artificial openings of urinary tract: Secondary | ICD-10-CM | POA: Diagnosis not present

## 2016-01-20 DIAGNOSIS — I2510 Atherosclerotic heart disease of native coronary artery without angina pectoris: Secondary | ICD-10-CM | POA: Diagnosis present

## 2016-01-20 DIAGNOSIS — A419 Sepsis, unspecified organism: Secondary | ICD-10-CM | POA: Diagnosis not present

## 2016-01-20 DIAGNOSIS — R778 Other specified abnormalities of plasma proteins: Secondary | ICD-10-CM | POA: Diagnosis present

## 2016-01-20 DIAGNOSIS — I248 Other forms of acute ischemic heart disease: Secondary | ICD-10-CM | POA: Diagnosis not present

## 2016-01-20 DIAGNOSIS — E876 Hypokalemia: Secondary | ICD-10-CM | POA: Diagnosis not present

## 2016-01-20 DIAGNOSIS — I5023 Acute on chronic systolic (congestive) heart failure: Secondary | ICD-10-CM | POA: Diagnosis not present

## 2016-01-20 DIAGNOSIS — I509 Heart failure, unspecified: Secondary | ICD-10-CM | POA: Diagnosis not present

## 2016-01-20 DIAGNOSIS — E872 Acidosis: Secondary | ICD-10-CM | POA: Diagnosis not present

## 2016-01-20 DIAGNOSIS — N12 Tubulo-interstitial nephritis, not specified as acute or chronic: Secondary | ICD-10-CM | POA: Diagnosis not present

## 2016-01-20 DIAGNOSIS — R0902 Hypoxemia: Secondary | ICD-10-CM | POA: Diagnosis not present

## 2016-01-20 DIAGNOSIS — I214 Non-ST elevation (NSTEMI) myocardial infarction: Secondary | ICD-10-CM | POA: Diagnosis not present

## 2016-01-20 DIAGNOSIS — B964 Proteus (mirabilis) (morganii) as the cause of diseases classified elsewhere: Secondary | ICD-10-CM | POA: Diagnosis present

## 2016-01-20 DIAGNOSIS — Z96652 Presence of left artificial knee joint: Secondary | ICD-10-CM | POA: Diagnosis not present

## 2016-01-20 DIAGNOSIS — R6521 Severe sepsis with septic shock: Secondary | ICD-10-CM

## 2016-01-20 DIAGNOSIS — R300 Dysuria: Secondary | ICD-10-CM | POA: Diagnosis not present

## 2016-01-20 DIAGNOSIS — N2889 Other specified disorders of kidney and ureter: Secondary | ICD-10-CM | POA: Diagnosis not present

## 2016-01-20 DIAGNOSIS — M25562 Pain in left knee: Secondary | ICD-10-CM | POA: Diagnosis not present

## 2016-01-20 DIAGNOSIS — A4159 Other Gram-negative sepsis: Secondary | ICD-10-CM | POA: Diagnosis not present

## 2016-01-20 DIAGNOSIS — J90 Pleural effusion, not elsewhere classified: Secondary | ICD-10-CM | POA: Diagnosis not present

## 2016-01-20 DIAGNOSIS — R7989 Other specified abnormal findings of blood chemistry: Secondary | ICD-10-CM | POA: Diagnosis not present

## 2016-01-20 DIAGNOSIS — N132 Hydronephrosis with renal and ureteral calculous obstruction: Secondary | ICD-10-CM | POA: Diagnosis not present

## 2016-01-20 DIAGNOSIS — E871 Hypo-osmolality and hyponatremia: Secondary | ICD-10-CM | POA: Diagnosis not present

## 2016-01-20 DIAGNOSIS — R9431 Abnormal electrocardiogram [ECG] [EKG]: Secondary | ICD-10-CM | POA: Diagnosis not present

## 2016-01-20 DIAGNOSIS — L8990 Pressure ulcer of unspecified site, unspecified stage: Secondary | ICD-10-CM | POA: Insufficient documentation

## 2016-01-20 DIAGNOSIS — R410 Disorientation, unspecified: Secondary | ICD-10-CM | POA: Diagnosis not present

## 2016-01-20 DIAGNOSIS — B009 Herpesviral infection, unspecified: Secondary | ICD-10-CM | POA: Diagnosis present

## 2016-01-20 DIAGNOSIS — N390 Urinary tract infection, site not specified: Secondary | ICD-10-CM | POA: Diagnosis not present

## 2016-01-20 DIAGNOSIS — R918 Other nonspecific abnormal finding of lung field: Secondary | ICD-10-CM | POA: Diagnosis not present

## 2016-01-20 DIAGNOSIS — I213 ST elevation (STEMI) myocardial infarction of unspecified site: Secondary | ICD-10-CM | POA: Diagnosis not present

## 2016-01-20 DIAGNOSIS — A4189 Other specified sepsis: Secondary | ICD-10-CM | POA: Diagnosis not present

## 2016-01-20 DIAGNOSIS — Z823 Family history of stroke: Secondary | ICD-10-CM

## 2016-01-20 DIAGNOSIS — R112 Nausea with vomiting, unspecified: Secondary | ICD-10-CM | POA: Diagnosis not present

## 2016-01-20 DIAGNOSIS — Z7901 Long term (current) use of anticoagulants: Secondary | ICD-10-CM

## 2016-01-20 DIAGNOSIS — R509 Fever, unspecified: Secondary | ICD-10-CM

## 2016-01-20 DIAGNOSIS — Z955 Presence of coronary angioplasty implant and graft: Secondary | ICD-10-CM

## 2016-01-20 DIAGNOSIS — G9341 Metabolic encephalopathy: Secondary | ICD-10-CM | POA: Diagnosis present

## 2016-01-20 DIAGNOSIS — Z79899 Other long term (current) drug therapy: Secondary | ICD-10-CM

## 2016-01-20 DIAGNOSIS — R079 Chest pain, unspecified: Secondary | ICD-10-CM | POA: Diagnosis not present

## 2016-01-20 DIAGNOSIS — N1330 Unspecified hydronephrosis: Secondary | ICD-10-CM | POA: Insufficient documentation

## 2016-01-20 DIAGNOSIS — Z452 Encounter for adjustment and management of vascular access device: Secondary | ICD-10-CM | POA: Diagnosis not present

## 2016-01-20 DIAGNOSIS — Z8673 Personal history of transient ischemic attack (TIA), and cerebral infarction without residual deficits: Secondary | ICD-10-CM

## 2016-01-20 DIAGNOSIS — D62 Acute posthemorrhagic anemia: Secondary | ICD-10-CM | POA: Diagnosis not present

## 2016-01-20 DIAGNOSIS — Z8614 Personal history of Methicillin resistant Staphylococcus aureus infection: Secondary | ICD-10-CM

## 2016-01-20 DIAGNOSIS — Z86718 Personal history of other venous thrombosis and embolism: Secondary | ICD-10-CM

## 2016-01-20 DIAGNOSIS — R1084 Generalized abdominal pain: Secondary | ICD-10-CM | POA: Diagnosis not present

## 2016-01-20 DIAGNOSIS — I10 Essential (primary) hypertension: Secondary | ICD-10-CM | POA: Diagnosis not present

## 2016-01-20 DIAGNOSIS — Z87891 Personal history of nicotine dependence: Secondary | ICD-10-CM

## 2016-01-20 DIAGNOSIS — N136 Pyonephrosis: Secondary | ICD-10-CM | POA: Diagnosis present

## 2016-01-20 DIAGNOSIS — R0600 Dyspnea, unspecified: Secondary | ICD-10-CM | POA: Diagnosis not present

## 2016-01-20 DIAGNOSIS — Z7982 Long term (current) use of aspirin: Secondary | ICD-10-CM

## 2016-01-20 DIAGNOSIS — I5021 Acute systolic (congestive) heart failure: Secondary | ICD-10-CM | POA: Diagnosis not present

## 2016-01-20 DIAGNOSIS — N135 Crossing vessel and stricture of ureter without hydronephrosis: Secondary | ICD-10-CM | POA: Diagnosis not present

## 2016-01-20 DIAGNOSIS — N179 Acute kidney failure, unspecified: Secondary | ICD-10-CM | POA: Diagnosis present

## 2016-01-20 DIAGNOSIS — K1379 Other lesions of oral mucosa: Secondary | ICD-10-CM | POA: Diagnosis not present

## 2016-01-20 LAB — COMPREHENSIVE METABOLIC PANEL
ALBUMIN: 2.6 g/dL — AB (ref 3.5–5.0)
ALK PHOS: 255 U/L — AB (ref 38–126)
ALT: 68 U/L — ABNORMAL HIGH (ref 14–54)
ANION GAP: 16 — AB (ref 5–15)
AST: 115 U/L — ABNORMAL HIGH (ref 15–41)
BILIRUBIN TOTAL: 1 mg/dL (ref 0.3–1.2)
BUN: 36 mg/dL — AB (ref 6–20)
CALCIUM: 10.4 mg/dL — AB (ref 8.9–10.3)
CO2: 16 mmol/L — ABNORMAL LOW (ref 22–32)
Chloride: 104 mmol/L (ref 101–111)
Creatinine, Ser: 1.52 mg/dL — ABNORMAL HIGH (ref 0.44–1.00)
GFR calc Af Amer: 38 mL/min — ABNORMAL LOW (ref >60)
GFR, EST NON AFRICAN AMERICAN: 33 mL/min — AB (ref >60)
GLUCOSE: 110 mg/dL — AB (ref 65–99)
POTASSIUM: 4.7 mmol/L (ref 3.5–5.1)
Sodium: 136 mmol/L (ref 135–145)
TOTAL PROTEIN: 6.9 g/dL (ref 6.5–8.1)

## 2016-01-20 LAB — I-STAT CG4 LACTIC ACID, ED
LACTIC ACID, VENOUS: 5.9 mmol/L — AB (ref 0.5–2.0)
Lactic Acid, Venous: 7.74 mmol/L (ref 0.5–2.0)

## 2016-01-20 LAB — DIFFERENTIAL
BASOS ABS: 0 10*3/uL (ref 0.0–0.1)
BASOS PCT: 0 %
EOS ABS: 0 10*3/uL (ref 0.0–0.7)
Eosinophils Relative: 0 %
Lymphocytes Relative: 6 %
Lymphs Abs: 0.4 10*3/uL — ABNORMAL LOW (ref 0.7–4.0)
Monocytes Absolute: 0.1 10*3/uL (ref 0.1–1.0)
Monocytes Relative: 1 %
NEUTROS PCT: 93 %
Neutro Abs: 6.3 10*3/uL (ref 1.7–7.7)

## 2016-01-20 LAB — URINE MICROSCOPIC-ADD ON

## 2016-01-20 LAB — CBC
HCT: 37.7 % (ref 36.0–46.0)
HEMOGLOBIN: 11.5 g/dL — AB (ref 12.0–15.0)
MCH: 26 pg (ref 26.0–34.0)
MCHC: 30.5 g/dL (ref 30.0–36.0)
MCV: 85.3 fL (ref 78.0–100.0)
PLATELETS: 389 10*3/uL (ref 150–400)
RBC: 4.42 MIL/uL (ref 3.87–5.11)
RDW: 15.7 % — AB (ref 11.5–15.5)
WBC: 6.9 10*3/uL (ref 4.0–10.5)

## 2016-01-20 LAB — URINALYSIS, ROUTINE W REFLEX MICROSCOPIC
Bilirubin Urine: NEGATIVE
GLUCOSE, UA: NEGATIVE mg/dL
Ketones, ur: NEGATIVE mg/dL
Nitrite: POSITIVE — AB
PH: 7 (ref 5.0–8.0)
PROTEIN: NEGATIVE mg/dL
SPECIFIC GRAVITY, URINE: 1.014 (ref 1.005–1.030)

## 2016-01-20 LAB — I-STAT TROPONIN, ED: TROPONIN I, POC: 0.02 ng/mL (ref 0.00–0.08)

## 2016-01-20 LAB — LIPASE, BLOOD: Lipase: 26 U/L (ref 11–51)

## 2016-01-20 MED ORDER — SODIUM CHLORIDE 0.9 % IV BOLUS (SEPSIS)
500.0000 mL | Freq: Once | INTRAVENOUS | Status: AC
  Administered 2016-01-20: 500 mL via INTRAVENOUS

## 2016-01-20 MED ORDER — LIDOCAINE HCL 1 % IJ SOLN
INTRAMUSCULAR | Status: AC
  Administered 2016-01-20: 10 mL
  Filled 2016-01-20: qty 20

## 2016-01-20 MED ORDER — SODIUM CHLORIDE 0.9 % IV SOLN
INTRAVENOUS | Status: DC
  Administered 2016-01-21: 13:00:00 via INTRAVENOUS

## 2016-01-20 MED ORDER — SODIUM CHLORIDE 0.9 % IV BOLUS (SEPSIS)
1000.0000 mL | Freq: Once | INTRAVENOUS | Status: AC
  Administered 2016-01-20: 1000 mL via INTRAVENOUS

## 2016-01-20 MED ORDER — SODIUM CHLORIDE 0.9 % IV SOLN: 250.0000 mL | INTRAVENOUS | Status: DC | PRN

## 2016-01-20 MED ORDER — AZITHROMYCIN 500 MG IV SOLR
500.0000 mg | Freq: Once | INTRAVENOUS | Status: AC
  Administered 2016-01-20: 500 mg via INTRAVENOUS
  Filled 2016-01-20: qty 500

## 2016-01-20 MED ORDER — HEPARIN SODIUM (PORCINE) 5000 UNIT/ML IJ SOLN
5000.0000 [IU] | Freq: Three times a day (TID) | INTRAMUSCULAR | Status: DC
  Administered 2016-01-21 (×3): 5000 [IU] via SUBCUTANEOUS
  Filled 2016-01-20 (×4): qty 1

## 2016-01-20 MED ORDER — VANCOMYCIN HCL IN DEXTROSE 1-5 GM/200ML-% IV SOLN: 1000.0000 mg | Freq: Once | INTRAVENOUS | Status: DC

## 2016-01-20 MED ORDER — FENTANYL CITRATE (PF) 100 MCG/2ML IJ SOLN
INTRAMUSCULAR | Status: AC
  Filled 2016-01-20: qty 2

## 2016-01-20 MED ORDER — MIDAZOLAM HCL 2 MG/2ML IJ SOLN
INTRAMUSCULAR | Status: AC | PRN
  Administered 2016-01-20 (×2): 0.5 mg via INTRAVENOUS

## 2016-01-20 MED ORDER — CEFTAZIDIME 2 G IJ SOLR
2.0000 g | Freq: Once | INTRAMUSCULAR | Status: AC
  Administered 2016-01-20: 2 g via INTRAVENOUS
  Filled 2016-01-20: qty 2

## 2016-01-20 MED ORDER — NOREPINEPHRINE BITARTRATE 1 MG/ML IV SOLN
2.0000 ug/min | INTRAVENOUS | Status: DC
  Administered 2016-01-20: 10 ug/min via INTRAVENOUS
  Filled 2016-01-20: qty 4

## 2016-01-20 MED ORDER — INSULIN ASPART 100 UNIT/ML ~~LOC~~ SOLN
0.0000 [IU] | SUBCUTANEOUS | Status: DC
  Administered 2016-01-21: 1 [IU] via SUBCUTANEOUS
  Administered 2016-01-21 (×2): 2 [IU] via SUBCUTANEOUS
  Administered 2016-01-22 (×3): 1 [IU] via SUBCUTANEOUS
  Administered 2016-01-25: 2 [IU] via SUBCUTANEOUS
  Administered 2016-01-25: 1 [IU] via SUBCUTANEOUS
  Administered 2016-01-26: 2 [IU] via SUBCUTANEOUS
  Administered 2016-01-27 – 2016-01-28 (×4): 1 [IU] via SUBCUTANEOUS
  Administered 2016-01-29: 2 [IU] via SUBCUTANEOUS
  Administered 2016-01-29: 3 [IU] via SUBCUTANEOUS
  Administered 2016-01-30 (×2): 1 [IU] via SUBCUTANEOUS
  Administered 2016-01-31 – 2016-02-01 (×2): 2 [IU] via SUBCUTANEOUS

## 2016-01-20 MED ORDER — NOREPINEPHRINE BITARTRATE 1 MG/ML IV SOLN
5.0000 ug/min | INTRAVENOUS | Status: DC
  Administered 2016-01-20: 20 ug/min via INTRAVENOUS
  Filled 2016-01-20 (×2): qty 4

## 2016-01-20 MED ORDER — MIDAZOLAM HCL 2 MG/2ML IJ SOLN
INTRAMUSCULAR | Status: AC
  Filled 2016-01-20: qty 2

## 2016-01-20 MED ORDER — ONDANSETRON HCL 4 MG/2ML IJ SOLN: 4.0000 mg | Freq: Four times a day (QID) | INTRAMUSCULAR | Status: DC | PRN

## 2016-01-20 MED ORDER — DEXTROSE 5 % IV SOLN
1.0000 g | INTRAVENOUS | Status: DC
  Filled 2016-01-20: qty 10

## 2016-01-20 MED ORDER — PANTOPRAZOLE SODIUM 40 MG PO TBEC
40.0000 mg | DELAYED_RELEASE_TABLET | Freq: Every day | ORAL | Status: DC
  Administered 2016-01-21: 40 mg via ORAL
  Filled 2016-01-20: qty 1

## 2016-01-20 MED ORDER — IOPAMIDOL (ISOVUE-300) INJECTION 61%
INTRAVENOUS | Status: AC
  Administered 2016-01-20: 100 mL
  Filled 2016-01-20: qty 100

## 2016-01-20 MED ORDER — HYDROCORTISONE NA SUCCINATE PF 100 MG IJ SOLR
50.0000 mg | Freq: Four times a day (QID) | INTRAMUSCULAR | Status: DC
  Administered 2016-01-21 – 2016-01-23 (×9): 50 mg via INTRAVENOUS
  Filled 2016-01-20 (×8): qty 1
  Filled 2016-01-20: qty 2
  Filled 2016-01-20: qty 1

## 2016-01-20 MED ORDER — ONDANSETRON HCL 4 MG/2ML IJ SOLN
4.0000 mg | Freq: Once | INTRAMUSCULAR | Status: AC
  Administered 2016-01-20: 4 mg via INTRAVENOUS
  Filled 2016-01-20: qty 2

## 2016-01-20 MED ORDER — FENTANYL CITRATE (PF) 100 MCG/2ML IJ SOLN
INTRAMUSCULAR | Status: AC | PRN
  Administered 2016-01-20 (×2): 12.5 ug via INTRAVENOUS

## 2016-01-20 MED ORDER — DEXTROSE 5 % IV SOLN
2.0000 g | Freq: Once | INTRAVENOUS | Status: AC
  Administered 2016-01-20: 2 g via INTRAVENOUS
  Filled 2016-01-20: qty 2

## 2016-01-20 MED ORDER — DEXTROSE 5 % IV SOLN
1.0000 g | INTRAVENOUS | Status: DC
  Filled 2016-01-20: qty 1

## 2016-01-20 MED ORDER — VANCOMYCIN HCL 500 MG IV SOLR
500.0000 mg | INTRAVENOUS | Status: DC
  Administered 2016-01-21: 500 mg via INTRAVENOUS
  Filled 2016-01-20 (×2): qty 500

## 2016-01-20 MED ORDER — IOPAMIDOL (ISOVUE-300) INJECTION 61%
INTRAVENOUS | Status: AC
  Administered 2016-01-20: 20 mL
  Filled 2016-01-20: qty 50

## 2016-01-20 MED ORDER — ACETAMINOPHEN 500 MG PO TABS
500.0000 mg | ORAL_TABLET | Freq: Once | ORAL | Status: AC
  Administered 2016-01-20: 500 mg via ORAL
  Filled 2016-01-20: qty 1

## 2016-01-20 MED ORDER — SODIUM CHLORIDE 0.9 % IV BOLUS (SEPSIS): 1000.0000 mL | Freq: Once | INTRAVENOUS | Status: DC

## 2016-01-20 NOTE — ED Provider Notes (Signed)
CSN: [id number]     Arrival date & time 01/20/16  1452 History   First MD Initiated Contact with Patient 01/20/16 1549     Chief Complaint  Patient presents with  . Emesis  . Shortness of Breath     (Consider location/radiation/quality/duration/timing/severity/associated sxs/prior Treatment) HPI  75 year old female presents with family for vomiting. Since around 11 AM she has vomited 3 times. No blood. No diarrhea. She has been complaining of right lower abdominal pain. She has been having all tubal issues with a left knee surgery. A couple months ago she had knee replacement and has had it infected and a graft placed. Currently saw her plastic surgeon today and was told to knee skin looks fine. Family states she's complaining of dysuria today and has been urinating frequently since yesterday. No cough/dyspnea. No chest pain. No reports of fever. Patient has been a little more confused today than typical. Vomited on her knee immobilizer, and was told to get a new one by the plastic surgeon.  Past Medical History  Diagnosis Date  . CAD (coronary artery disease)   . HTN (hypertension)   . Hyperlipidemia   . Cerebrovascular disease   . Arthritis   . PVD (peripheral vascular disease) (HCC)     dvt in leg  . MRSA (methicillin resistant staph aureus) culture positive   . Wears glasses   . Heart murmur   . History of bronchitis   . Memory impairment   . Complication of anesthesia     Three days after procedure pt statrted to suffer with memoy loss that took about 4-5 days to come back according to [name]."   Past Surgical History  Procedure Laterality Date  . Knee surgery      denies  . Abdominal hysterectomy    . Total hip arthroplasty Right 09/10/2012    Dr Sharol Given  . Total hip arthroplasty Right 09/10/2012    Procedure: TOTAL HIP ARTHROPLASTY;  Surgeon: Newt Minion, MD;  Location: [place];  Service: Orthopedics;  Laterality: Right;  Right Total Hip Arthroplasty  . Coronary  angioplasty  07    3 stents placed  . Total knee arthroplasty Left 09/28/2015    Procedure: TOTAL KNEE ARTHROPLASTY;  Surgeon: Newt Minion, MD;  Location: [place];  Service: Orthopedics;  Laterality: Left;  . I&d knee with poly exchange Left 10/12/2015    Procedure: Irrigation and Debridement , Poly Exchange, Antibiotic Beads Left Knee;  Surgeon: Newt Minion, MD;  Location: [place];  Service: Orthopedics;  Laterality: Left;  . I&d extremity Left 10/18/2015    Procedure: Left Knee Washout, Reclosure;  Surgeon: Newt Minion, MD;  Location: [place];  Service: Orthopedics;  Laterality: Left;  [name] Eye surgery Bilateral     cataracts  . Colonoscopy    . Esophagogastroduodenoscopy    . Skin split graft Left 11/16/2015    Procedure: LEFT GASTROC MUSCLE FLAP WITH SKIN GRAFT SPLIT THICKNESS AND VAC PLACEMENT;  Surgeon: Loel Lofty [name], DO;  Location: [place];  Service: Plastics;  Laterality: Left;  . Application of wound vac Left 11/16/2015    Procedure: APPLICATION OF WOUND VAC;  Surgeon: Loel Lofty [name], DO;  Location: [place];  Service: Plastics;  Laterality: Left;  . Free flap to extremity Left 11/16/2015    Procedure: FREE FLAP TO EXTREMITY;  Surgeon: Loel Lofty [name], DO;  Location: [place];  Service: Plastics;  Laterality: Left;  . I&d extremity Left 12/08/2015  Procedure: IRRIGATION AND DEBRIDEMENT knee;  Surgeon: Loel Lofty [name], DO;  Location: [place];  Service: Plastics;  Laterality: Left;  . Application of a-cell of extremity Left 12/08/2015    Procedure: APPLICATION OF A-CELL OF knee;  Surgeon: Loel Lofty [name], DO;  Location: [place];  Service: Plastics;  Laterality: Left;  . Application of wound vac Left 12/08/2015    Procedure: APPLICATION OF WOUND VAC  AND CAST to knee;  Surgeon: Loel Lofty [name], DO;  Location: [place];  Service: Plastics;  Laterality: Left;  . Incision and drainage of wound Left 01/04/2016    Procedure: DEBRIDEMENT OF LEFT KNEE WOUND WITH ACELL AND WOUND VAC PLACEMENT;   Surgeon: Loel Lofty [name], DO;  Location: [place];  Service: Plastics;  Laterality: Left;   Family History  Problem Relation Age of Onset  . Breast cancer Mother   . Leukemia Father   . Stroke Sister   . Bone cancer     Social History  Substance Use Topics  . Smoking status: Former Smoker -- 0.50 packs/day for 50 years    Types: Cigarettes    Quit date: 09/28/2015  . Smokeless tobacco: Never Used     Comment: quit 09/27/15  . Alcohol Use: No   OB History    No data available     Review of Systems  Constitutional: Negative for fever.  Respiratory: Negative for cough and shortness of breath.   Cardiovascular: Negative for chest pain.  Gastrointestinal: Positive for vomiting and abdominal pain.  Genitourinary: Positive for dysuria and frequency.  All other systems reviewed and are negative.     Allergies  Review of patient's allergies indicates no known allergies.  Home Medications   Prior to Admission medications   Medication Sig Start Date End Date Taking? Authorizing Provider  amLODipine (NORVASC) 10 MG tablet TAKE 1 TABLET (10 MG TOTAL) BY MOUTH DAILY. Patient taking differently: Take 10 mg by mouth daily.  08/27/14   Lelon Perla, MD  aspirin EC 81 MG tablet Take 81 mg by mouth daily.    Historical Provider, MD  atorvastatin (LIPITOR) 40 MG tablet TAKE ONE TABLET BY MOUTH DAILY. Patient taking differently: Take 40 mg by mouth daily.  09/15/15   Lelon Perla, MD  budesonide-formoterol (SYMBICORT) 160-4.5 MCG/ACT inhaler Inhale 2 puffs into the lungs 2 (two) times daily.    Historical Provider, MD  colchicine 0.6 MG tablet Take 0.6 mg by mouth daily as needed (gout flare).  08/30/15   Historical Provider, MD  diclofenac (VOLTAREN) 75 MG EC tablet Take 75 mg by mouth 2 (two) times daily.  12/18/11   Historical Provider, MD  doxycycline (VIBRA-TABS) 100 MG tablet Take 1 tablet (100 mg total) by mouth 2 (two) times daily. Patient taking differently: Take 100 mg by  mouth 2 (two) times daily. Continuous course until  05/25/16 11/24/15 05/25/16  Thayer Headings, MD  enoxaparin (LOVENOX) 30 MG/0.3ML injection Inject 0.3 mLs (30 mg total) into the skin daily. Patient taking differently: Inject 30 mg into the skin daily with supper.  11/17/15   Shawn Montgomery Rayburn, PA-C  Glucosamine-Chondroit-Vit C-Mn (GLUCOSAMINE-CHONDROITIN) CAPS Take 1 capsule by mouth daily.    Historical Provider, MD  hydrochlorothiazide (MICROZIDE) 12.5 MG capsule TAKE 1 CAPSULE (12.5 MG TOTAL) BY MOUTH EVERY MORNING. Patient taking differently: Take 12.5 mg by mouth daily.  08/27/14   Lelon Perla, MD  HYDROcodone-acetaminophen (NORCO/VICODIN) 5-325 MG tablet Take 1 tablet by mouth See admin instructions. Take 1  tablet by mouth daily at bedtime, may take an additional tablet during the day as needed for pain    Historical Provider, MD  lisinopril (PRINIVIL,ZESTRIL) 40 MG tablet TAKE 1 TABLET (40 MG TOTAL) BY MOUTH DAILY. Patient taking differently: Take 40 mg by mouth daily.  08/27/14   Lelon Perla, MD  Multiple Vitamin (MULITIVITAMIN WITH MINERALS) TABS Take 1 tablet by mouth daily.    Historical Provider, MD  polyethylene glycol (MIRALAX / GLYCOLAX) packet Take 17 g by mouth daily. Patient taking differently: Take 17 g by mouth every evening.  11/17/15   Shawn Montgomery Rayburn, PA-C   BP 94/57 mmHg  Pulse 122  Temp(Src) 98.2 F (36.8 C) (Oral)  Resp 26  Ht 5' 2.5" (1.588 m)  Wt 108 lb (48.988 kg)  BMI 19.43 kg/m2  SpO2 97% Physical Exam  Constitutional: She is oriented to person, place, and time. She appears well-developed and well-nourished.  HENT:  Head: Normocephalic and atraumatic.  Right Ear: External ear normal.  Left Ear: External ear normal.  Nose: Nose normal.  Eyes: Right eye exhibits no discharge. Left eye exhibits no discharge.  Cardiovascular: Regular rhythm and normal heart sounds.  Tachycardia present.   Pulmonary/Chest: Effort normal and breath  sounds normal. Tachypnea (mild) noted. She has no wheezes.  Abdominal: Soft. There is tenderness (mild) in the right lower quadrant. There is CVA tenderness (mild, bilateral).  Musculoskeletal:  Left knee is heavily wrapped in gauze and then ace and knee immobilizer. Family would like it to remain covered, saw plastic surgeon who states site looks fine  Neurological: She is alert and oriented to person, place, and time.  Skin: Skin is warm and dry.  Nursing note and vitals reviewed.   ED Course  .Central Line Date/Time: 01/20/2016 8:39 PM Performed by: Sherwood Gambler Authorized by: Sherwood Gambler Consent: Verbal consent obtained. Written consent obtained. Risks and benefits: risks, benefits and alternatives were discussed Consent given by: patient (and son) Required items: required blood products, implants, devices, and special equipment available Patient identity confirmed: verbally with patient and arm band Time out: Immediately prior to procedure a "time out" was called to verify the correct patient, procedure, equipment, support staff and site/side marked as required. Indications: vascular access Anesthesia: local infiltration Local anesthetic: lidocaine 1% without epinephrine Patient sedated: no Preparation: skin prepped with ChloraPrep Skin prep agent dried: skin prep agent completely dried prior to procedure Sterile barriers: all five maximum sterile barriers used - cap, mask, sterile gown, sterile gloves, and large sterile sheet Hand hygiene: hand hygiene performed prior to central venous catheter insertion Location details: right internal jugular Patient position: Trendelenburg Catheter type: triple lumen Pre-procedure: landmarks identified Ultrasound guidance: yes Sterile ultrasound techniques: sterile gel and sterile probe covers were used Number of attempts: 1 Successful placement: yes Post-procedure: line sutured and dressing applied Assessment: blood return through  all ports,  free fluid flow,  placement verified by x-ray and no pneumothorax on x-ray Patient tolerance: Patient tolerated the procedure well with no immediate complications   (including critical care time) Labs Review Labs Reviewed  COMPREHENSIVE METABOLIC PANEL - Abnormal; Notable for the following:    CO2 16 (*)    Glucose, Bld 110 (*)    BUN 36 (*)    Creatinine, Ser 1.52 (*)    Calcium 10.4 (*)    Albumin 2.6 (*)    AST 115 (*)    ALT 68 (*)    Alkaline Phosphatase 255 (*)    GFR  calc non Af Amer 33 (*)    GFR calc Af Amer 38 (*)    Anion gap 16 (*)    All other components within normal limits  CBC - Abnormal; Notable for the following:    Hemoglobin 11.5 (*)    RDW 15.7 (*)    All other components within normal limits  URINALYSIS, ROUTINE W REFLEX MICROSCOPIC (NOT AT [facility]) - Abnormal; Notable for the following:    Color, Urine AMBER (*)    Hgb urine dipstick MODERATE (*)    Nitrite POSITIVE (*)    Leukocytes, UA SMALL (*)    All other components within normal limits  DIFFERENTIAL - Abnormal; Notable for the following:    Lymphs Abs 0.4 (*)    All other components within normal limits  URINE MICROSCOPIC-ADD ON - Abnormal; Notable for the following:    Squamous Epithelial / LPF 0-5 (*)    Bacteria, UA FEW (*)    All other components within normal limits  I-STAT CG4 LACTIC ACID, ED - Abnormal; Notable for the following:    Lactic Acid, Venous 7.74 (*)    All other components within normal limits  I-STAT CG4 LACTIC ACID, ED - Abnormal; Notable for the following:    Lactic Acid, Venous 5.90 (*)    All other components within normal limits  CULTURE, BLOOD (ROUTINE X 2)  CULTURE, BLOOD (ROUTINE X 2)  URINE CULTURE  URINE CULTURE  LIPASE, BLOOD  CBC  CREATININE, SERUM  PROCALCITONIN  CORTISOL  CBC  BASIC METABOLIC PANEL  MAGNESIUM  PHOSPHORUS  PROCALCITONIN  TROPONIN I  BASIC METABOLIC PANEL  BASIC METABOLIC PANEL  BASIC METABOLIC PANEL  I-STAT TROPOININ,  ED  I-STAT CG4 LACTIC ACID, ED  TYPE AND SCREEN    Imaging Review Ct Abdomen Pelvis W Contrast  01/20/2016  CLINICAL DATA:  Right lower quadrant abdominal pain for 5 days with nausea and vomiting. EXAM: CT ABDOMEN AND PELVIS WITH CONTRAST TECHNIQUE: Multidetector CT imaging of the abdomen and pelvis was performed using the standard protocol following bolus administration of intravenous contrast. CONTRAST:  138m ISOVUE-300 IOPAMIDOL (ISOVUE-300) INJECTION 61% COMPARISON:  08/06/2005 CT abdomen/ pelvis. 02/14/2007 abdominal sonogram. FINDINGS: Lower chest: Centrilobular emphysema. Right middle lobe 3 mm pulmonary nodule (series 205/ image 6) is stable since 2007 and benign. Left main, left anterior descending, left circumflex and right coronary atherosclerosis. Hepatobiliary: Heterogeneous liver parenchymal attenuation, with geographic areas of low attenuation in the left liver lobe. No definite liver surface irregularity. No liver mass. Mild diffuse periportal edema. Numerous subcentimeter calcified gallstones layering in the nondistended gallbladder, with no definite gallbladder wall thickening or pericholecystic fluid. No biliary ductal dilatation. Pancreas: Normal, with no mass or duct dilation. Spleen: Normal size. No mass. Adrenals/Urinary Tract: Mild diffuse irregular thickening of both adrenal glands without discrete adrenal mass, not definitely changed back to 2007, suggesting adrenal hyperplasia . Probable 2 mm stone at the right ureterovesical junction, noting limited visualization of the pelvic segment of the right ureter due to streak artifact from the right hip hardware. Separate 3 mm stone in the mid right pelvic ureter, with mild right hydroureteronephrosis. Additional nonobstructing 4 mm interpolar and 5 mm lower right renal stones. Exophytic simple 3.2 cm renal cyst in the anterior mid to upper right kidney. No left hydronephrosis. Bladder poorly visualized due to streak artifact, with no  gross bladder abnormality. Stomach/Bowel: Grossly normal stomach. Normal caliber small bowel with no small bowel wall thickening. Normal appendix. Mild sigmoid diverticulosis, with no  large bowel wall thickening or pericolonic fat stranding. Prominent rectal stool with rectal diameter 8.0 cm. No definite rectal wall thickening. Vascular/Lymphatic: Atherosclerotic nonaneurysmal abdominal aorta. Insert vein No pathologically enlarged lymph nodes in the abdomen or pelvis. Reproductive: Status post hysterectomy, with no gross abnormal findings at the vaginal cuff. No adnexal mass. Other: No pneumoperitoneum, ascites or focal fluid collection. Musculoskeletal: No aggressive appearing focal osseous lesions. Right total hip arthroplasty is partially visualized. Moderate degenerative changes in the visualized thoracolumbar spine. Moderate to marked compression fracture of the T11 vertebral body of indeterminate chronicity, which appears new since the lateral 09/20/2015 chest radiograph. IMPRESSION: 1. Obstructing 2 mm right UVJ stone with mild right hydroureteronephrosis. Separate 3 mm stone in the mid right pelvic ureter. Additional nonobstructing right renal stones. 2. Suggestion of fecal impaction in the rectum. No definite rectal wall thickening. 3. Moderate to severe T11 vertebral compression fracture of indeterminate chronicity, which appears new since 09/20/2015. 4. Nonspecific liver parenchymal heterogeneity without discrete liver mass, favor hepatic steatosis, cannot exclude hepatic fibrosis. 5. Additional findings include left main and 3 vessel coronary atherosclerosis, centrilobular emphysema, mild sigmoid diverticulosis and cholelithiasis. Electronically Signed   By: Ilona Sorrel M.D.   On: 01/20/2016 18:38   Dg Chest Portable 1 View  01/20/2016  CLINICAL DATA:  Central line placement EXAM: PORTABLE CHEST 1 VIEW COMPARISON:  6/23/ 17 FINDINGS: Cardiomediastinal silhouette is stable. Streaky interstitial  prominence/ pneumonitis bilateral lower lobe and right upper lobe again noted. There is right IJ central line with tip in SVC. No pneumothorax. IMPRESSION: Right IJ central line in place.  No pneumothorax. Electronically Signed   By: Lahoma Crocker M.D.   On: 01/20/2016 20:51   Dg Chest Portable 1 View  01/20/2016  CLINICAL DATA:  Nausea and vomiting.  Pain. EXAM: PORTABLE CHEST 1 VIEW COMPARISON:  09/20/2015. FINDINGS: Mediastinum and hilar structures are normal. Mild bilateral upper lobe and bibasilar infiltrates. Small bilateral pleural effusions cannot be excluded. Heart size normal. Degenerative changes both shoulders. IMPRESSION: Mild bilateral upper lobe and bibasilar pulmonary infiltrates consistent with pneumonia. Electronically Signed   By: Marcello Moores  Register   On: 01/20/2016 17:00   I have personally reviewed and evaluated these images and lab results as part of my medical decision-making.   EKG Interpretation None      CRITICAL CARE Performed by: Sherwood Gambler T   Total critical care time: 70 minutes  Critical care time was exclusive of separately billable procedures and treating other patients.  Critical care was necessary to treat or prevent imminent or life-threatening deterioration.  Critical care was time spent personally by me on the following activities: development of treatment plan with patient and/or surrogate as well as nursing, discussions with consultants, evaluation of patient's response to treatment, examination of patient, obtaining history from patient or surrogate, ordering and performing treatments and interventions, ordering and review of laboratory studies, ordering and review of radiographic studies, pulse oximetry and re-evaluation of patient's condition.  MDM   Final diagnoses:  Septic shock ([place])    Patient initially. Is somewhat confused but blood pressure is adequate. However she is tachycardic and found to have a rectal temp of over 103. Treated per  sepsis protocol. CT shows ureteral stones on the right side. Appears to have a UTI. This is all concerning for an infected ureteral stone. She was in the ED despite IV fluids and antibiotics she has progressively declined. Is maintaining her airway and is awake and alert but her blood pressure is  getting worse. Discussed with ICU and a central line was put in as above. Discussed with urology, Dr. Diona Fanti , who recommends a Perc drain. Saw in ED. ICU to admit, Dr. Titus Mould has evaluated. BP better now that she's on levophed. Is currently in critical condition.    Sherwood Gambler, MD 01/21/16 [phone]

## 2016-01-20 NOTE — ED Notes (Signed)
MD made aware of patient's BP

## 2016-01-20 NOTE — ED Notes (Signed)
Pt transferred to IR 

## 2016-01-20 NOTE — ED Notes (Addendum)
Pt's O2 sats fluctuating 88-90% on room air; this tech placed pt on 2L O2 via nasal cannula; Pt's O2 sats now at 98% on 2L O2

## 2016-01-20 NOTE — ED Notes (Signed)
Central lined cleared for use per Dr. Regenia Skeeter

## 2016-01-20 NOTE — Procedures (Signed)
Interventional Radiology Procedure Note  Procedure:  Right percutaneous nephrostomy   Complications:  None  Estimated Blood Loss: < 10 mL  US shows moderate right hydronephrosis.  10 Fr PCN placed; formed in renal pelvis.  Attached to gravity bag.  Sample of urine sent for culture.  Venetia Night. Kathlene Cote, M.D Pager:  [phone]

## 2016-01-20 NOTE — Progress Notes (Signed)
Pharmacy Antibiotic Note  [name] is a 75 y.o. female admitted on 01/20/2016 with sepsis.  Pharmacy has been consulted for vancomycin and ceftazidime dosing. Pt with Tmax of 103.4 and WBC is WNL. Lactic acid is elevated at 5.9. SCr is elevated at 1.52.   Plan: - Vanc 1gm IV x 1 then '500mg'$  IV Q24H - Ceftaz 2gm IV x 1 then 1gm IV Q24H - F/u renal fxn, C&S, clinical status and trough at SS  Height: 5' 2.5" (158.8 cm) Weight: 108 lb (48.988 kg) IBW/kg (Calculated) : 51.25  Temp (24hrs), Avg:100.4 F (38 C), Min:98.2 F (36.8 C), Max:103.4 F (39.7 C)   Recent Labs Lab 01/20/16 1538 01/20/16 1648 01/20/16 1939  WBC 6.9  --   --   CREATININE 1.52*  --   --   LATICACIDVEN  --  7.74* 5.90*    Estimated Creatinine Clearance: 25.1 mL/min (by C-G formula based on Cr of 1.52).    No Known Allergies  Antimicrobials this admission: Vanc 6/23>> Ceftaz 6/23>> Azithro x 1 6/23 CTX x 1 6/23  Dose adjustments this admission: N/A  Microbiology results: Pending  Thank you for allowing pharmacy to be a part of this patient's care.  Leili Eskenazi, Rande Lawman 01/20/2016 8:45 PM

## 2016-01-20 NOTE — Sedation Documentation (Signed)
Patient denies pain and is resting comfortably.  

## 2016-01-20 NOTE — ED Notes (Signed)
Pt in c/o n/v onset today with reported x 3 vomiting episodes today, pt c/o RLQ abd pain onset yesterday, pt reports urinary frequency onset last night, pt denies diarrhea, denies CP & SOB, A&O x4, pt has had x 6 L knee surgeries x 3 mths

## 2016-01-20 NOTE — ED Notes (Signed)
IR at bedside

## 2016-01-20 NOTE — ED Notes (Signed)
Xray at the bedside.

## 2016-01-20 NOTE — ED Notes (Signed)
Pt was not moved into room in system. RN noticed feet from nurses station. Upon entering room, BP was noted to be 68/45 and pt's Levophed was on KVO. RN restarted drip and titrated per charting to get pt's BP within parameters.

## 2016-01-20 NOTE — H&P (Addendum)
PULMONARY / CRITICAL CARE MEDICINE   Name: [name] MRN: [id number] DOB: 1941-04-02    ADMISSION DATE:  01/20/2016 CONSULTATION DATE:  01/20/16  REFERRING MD:  Dr Regenia Skeeter   CHIEF COMPLAINT: sepsis  HISTORY OF PRESENT ILLNESS:   [name] is a 75 y.o. female who presented to the emergency room here at [facility] earlier today with a two-week history of crampy abdominal pain, frequency, urgency, dysuria.Pt in c/o n/v onset today with reported x 3 vomiting episodes today, pt c/o RLQ abd pain onset yesterday, pt reports urinary frequency onset last night, pt denies diarrhea, denies CP & SOB, A&O x4.  knee surgeries x 3 mths.  It was felt that the patient may have had a urinary tract infection. Earlier today, she went to the plastic surgeon who recently performed debridement of a left knee wound. She is status post knee replacement in March. Knee was cleared by plastics In the emergency room, she had sepsis dx, low BP and fever  103 f. CT of the abdomen  revealed mild to moderate right hydroureteronephrosis, 2 stones in the distal ureter-both small, but one at the UVJ and one slightly proximal. She had lactic acidosis and code sepsis called. Lactic was up also.   PAST MEDICAL HISTORY :  She  has a past medical history of CAD (coronary artery disease); HTN (hypertension); Hyperlipidemia; Cerebrovascular disease; Arthritis; PVD (peripheral vascular disease) ([place]); MRSA (methicillin resistant staph aureus) culture positive; Wears glasses; Heart murmur; History of bronchitis; Memory impairment; and Complication of anesthesia.  PAST SURGICAL HISTORY: She  has past surgical history that includes Knee surgery; Abdominal hysterectomy; Total hip arthroplasty (Right, 09/10/2012); Total hip arthroplasty (Right, 09/10/2012); Coronary angioplasty (07); Total knee arthroplasty (Left, 09/28/2015); I&D knee with poly exchange (Left, 10/12/2015); I&D extremity (Left, 10/18/2015); Eye surgery (Bilateral);  Colonoscopy; Esophagogastroduodenoscopy; Skin split graft (Left, 11/16/2015); Application if wound vac (Left, 11/16/2015); Free flap to extremity (Left, 11/16/2015); I&D extremity (Left, 12/08/2015); Application of a-cell of extremity (Left, 12/08/2015); Application if wound vac (Left, 12/08/2015); and Incision and drainage of wound (Left, 01/04/2016).  No Known Allergies  No current facility-administered medications on file prior to encounter.   Current Outpatient Prescriptions on File Prior to Encounter  Medication Sig  . amLODipine (NORVASC) 10 MG tablet TAKE 1 TABLET (10 MG TOTAL) BY MOUTH DAILY. (Patient taking differently: Take 10 mg by mouth daily. )  . aspirin EC 81 MG tablet Take 81 mg by mouth daily.  [name] atorvastatin (LIPITOR) 40 MG tablet TAKE ONE TABLET BY MOUTH DAILY. (Patient taking differently: Take 40 mg by mouth daily. )  . budesonide-formoterol (SYMBICORT) 160-4.5 MCG/ACT inhaler Inhale 2 puffs into the lungs 2 (two) times daily.  . colchicine 0.6 MG tablet Take 0.6 mg by mouth daily as needed (gout flare).   . diclofenac (VOLTAREN) 75 MG EC tablet Take 75 mg by mouth 2 (two) times daily.   [name] doxycycline (VIBRA-TABS) 100 MG tablet Take 1 tablet (100 mg total) by mouth 2 (two) times daily. (Patient taking differently: Take 100 mg by mouth 2 (two) times daily. Continuous course until  05/25/16)  . Glucosamine-Chondroit-Vit C-Mn (GLUCOSAMINE-CHONDROITIN) CAPS Take 1 capsule by mouth daily.  . hydrochlorothiazide (MICROZIDE) 12.5 MG capsule TAKE 1 CAPSULE (12.5 MG TOTAL) BY MOUTH EVERY MORNING. (Patient taking differently: Take 12.5 mg by mouth daily. )  . lisinopril (PRINIVIL,ZESTRIL) 40 MG tablet TAKE 1 TABLET (40 MG TOTAL) BY MOUTH DAILY. (Patient taking differently: Take 40 mg by mouth daily. )  .  Multiple Vitamin (MULITIVITAMIN WITH MINERALS) TABS Take 1 tablet by mouth daily.  . polyethylene glycol (MIRALAX / GLYCOLAX) packet Take 17 g by mouth daily. (Patient taking differently: Take  17 g by mouth daily as needed for moderate constipation. )  . enoxaparin (LOVENOX) 30 MG/0.3ML injection Inject 0.3 mLs (30 mg total) into the skin daily. (Patient taking differently: Inject 30 mg into the skin daily with supper. )    FAMILY HISTORY:  Her indicated that her mother is deceased. She indicated that her father is deceased.   SOCIAL HISTORY: She  reports that she quit smoking about 3 months ago. Her smoking use included Cigarettes. She has a 25 pack-year smoking history. She has never used smokeless tobacco. She reports that she does not drink alcohol or use illicit drugs.  REVIEW OF SYSTEMS:   Confused, unable  SUBJECTIVE:  Low BP  VITAL SIGNS: BP 83/48 mmHg  Pulse 97  Temp(Src) 101.7 F (38.7 C) (Rectal)  Resp 14  Ht 5' 2.5" (1.588 m)  Wt 48.988 kg (108 lb)  BMI 19.43 kg/m2  SpO2 92%  HEMODYNAMICS:    VENTILATOR SETTINGS:    INTAKE / OUTPUT: I/O last 3 completed shifts: In: 1500 [I.V.:1500] Out: -   PHYSICAL EXAMINATION: General: awake, no distress Neuro:  Confusion mild, nonfocal, perrl HEENT:  jvd flat,. Line clean Cardiovascular:  s1 s2 RRR no r Lungs:  CTA, no crackles Abdomen:  Soft, BS hypo, tender llq, no r/g Musculoskeletal:  Knee immobilzed, dry Skin:  Dry    Sepsis - Repeat Assessment  Performed at:   830 pm 01/20/16  Vitals     Blood pressure 88/53, pulse 48, temperature 101.7 F (38.7 C), temperature source Rectal, resp. rate 26, height 5' 2.5" (1.588 m), weight 48.988 kg (108 lb), SpO2 92 %.  Heart:     Regular rate and rhythm  Lungs:    CTA  Capillary Refill:   <2 sec  Peripheral Pulse:   Radial pulse palpable  Skin:     Dry      LABS:  BMET  Recent Labs Lab 01/20/16 1538  NA 136  K 4.7  CL 104  CO2 16*  BUN 36*  CREATININE 1.52*  GLUCOSE 110*    Electrolytes  Recent Labs Lab 01/20/16 1538  CALCIUM 10.4*    CBC  Recent Labs Lab 01/20/16 1538  WBC 6.9  HGB 11.5*  HCT 37.7  PLT 389     Coag's No results for input(s): APTT, INR in the last 168 hours.  Sepsis Markers  Recent Labs Lab 01/20/16 1648 01/20/16 1939  LATICACIDVEN 7.74* 5.90*    ABG No results for input(s): PHART, PCO2ART, PO2ART in the last 168 hours.  Liver Enzymes  Recent Labs Lab 01/20/16 1538  AST 115*  ALT 68*  ALKPHOS 255*  BILITOT 1.0  ALBUMIN 2.6*    Cardiac Enzymes No results for input(s): TROPONINI, PROBNP in the last 168 hours.  Glucose No results for input(s): GLUCAP in the last 168 hours.  Imaging Ct Abdomen Pelvis W Contrast  01/20/2016  CLINICAL DATA:  Right lower quadrant abdominal pain for 5 days with nausea and vomiting. EXAM: CT ABDOMEN AND PELVIS WITH CONTRAST TECHNIQUE: Multidetector CT imaging of the abdomen and pelvis was performed using the standard protocol following bolus administration of intravenous contrast. CONTRAST:  137m ISOVUE-300 IOPAMIDOL (ISOVUE-300) INJECTION 61% COMPARISON:  08/06/2005 CT abdomen/ pelvis. 02/14/2007 abdominal sonogram. FINDINGS: Lower chest: Centrilobular emphysema. Right middle lobe 3 mm pulmonary nodule (series  205/ image 6) is stable since 2007 and benign. Left main, left anterior descending, left circumflex and right coronary atherosclerosis. Hepatobiliary: Heterogeneous liver parenchymal attenuation, with geographic areas of low attenuation in the left liver lobe. No definite liver surface irregularity. No liver mass. Mild diffuse periportal edema. Numerous subcentimeter calcified gallstones layering in the nondistended gallbladder, with no definite gallbladder wall thickening or pericholecystic fluid. No biliary ductal dilatation. Pancreas: Normal, with no mass or duct dilation. Spleen: Normal size. No mass. Adrenals/Urinary Tract: Mild diffuse irregular thickening of both adrenal glands without discrete adrenal mass, not definitely changed back to 2007, suggesting adrenal hyperplasia . Probable 2 mm stone at the right ureterovesical  junction, noting limited visualization of the pelvic segment of the right ureter due to streak artifact from the right hip hardware. Separate 3 mm stone in the mid right pelvic ureter, with mild right hydroureteronephrosis. Additional nonobstructing 4 mm interpolar and 5 mm lower right renal stones. Exophytic simple 3.2 cm renal cyst in the anterior mid to upper right kidney. No left hydronephrosis. Bladder poorly visualized due to streak artifact, with no gross bladder abnormality. Stomach/Bowel: Grossly normal stomach. Normal caliber small bowel with no small bowel wall thickening. Normal appendix. Mild sigmoid diverticulosis, with no large bowel wall thickening or pericolonic fat stranding. Prominent rectal stool with rectal diameter 8.0 cm. No definite rectal wall thickening. Vascular/Lymphatic: Atherosclerotic nonaneurysmal abdominal aorta. Insert vein No pathologically enlarged lymph nodes in the abdomen or pelvis. Reproductive: Status post hysterectomy, with no gross abnormal findings at the vaginal cuff. No adnexal mass. Other: No pneumoperitoneum, ascites or focal fluid collection. Musculoskeletal: No aggressive appearing focal osseous lesions. Right total hip arthroplasty is partially visualized. Moderate degenerative changes in the visualized thoracolumbar spine. Moderate to marked compression fracture of the T11 vertebral body of indeterminate chronicity, which appears new since the lateral 09/20/2015 chest radiograph. IMPRESSION: 1. Obstructing 2 mm right UVJ stone with mild right hydroureteronephrosis. Separate 3 mm stone in the mid right pelvic ureter. Additional nonobstructing right renal stones. 2. Suggestion of fecal impaction in the rectum. No definite rectal wall thickening. 3. Moderate to severe T11 vertebral compression fracture of indeterminate chronicity, which appears new since 09/20/2015. 4. Nonspecific liver parenchymal heterogeneity without discrete liver mass, favor hepatic steatosis,  cannot exclude hepatic fibrosis. 5. Additional findings include left main and 3 vessel coronary atherosclerosis, centrilobular emphysema, mild sigmoid diverticulosis and cholelithiasis. Electronically Signed   By: Ilona Sorrel M.D.   On: 01/20/2016 18:38   Dg Chest Portable 1 View  01/20/2016  CLINICAL DATA:  Nausea and vomiting.  Pain. EXAM: PORTABLE CHEST 1 VIEW COMPARISON:  09/20/2015. FINDINGS: Mediastinum and hilar structures are normal. Mild bilateral upper lobe and bibasilar infiltrates. Small bilateral pleural effusions cannot be excluded. Heart size normal. Degenerative changes both shoulders. IMPRESSION: Mild bilateral upper lobe and bibasilar pulmonary infiltrates consistent with pneumonia. Electronically Signed   By: Marcello Moores  Register   On: 01/20/2016 17:00     STUDIES:  CT abdo>>>Obstructing 2 mm right UVJ stone with mild right hydroureteronephrosis. Separate 3 mm stone in the mid right pelvic ureter. Additional nonobstructing right renal stones. 2. Suggestion of fecal impaction in the rectum. No definite rectal wall thickening. 3. Moderate to severe T11 vertebral compression fracture of indeterminate chronicity  CULTURES: 6/23 BC>>> 6/23 urine>>>  ANTIBIOTICS: ceftaz 6/23>>> vanc 6/23>>>  SIGNIFICANT EVENTS: 6/23- septic shock, to perc nephrostomy  LINES/TUBES: 6/23 rt IJ>>>   ASSESSMENT / PLAN:  PULMONARY A: Chronic int changes P:   O2  Assess am pcxr for edema risk from resus  CARDIOVASCULAR A:  Septic shock P:  Sepsis protocol near completion Get cvp Cortisol then empiric roids Levophed to map 65, sys 90 goal Appears dry still, may need more volume If fib rvr tachy from levo will use neo Trop x 1  RENAL A:   ARF, obstructing uropathy P:   Stat perc nephrostomy, IR called they are here now to take pt At risk post obstructing diuresis, get bmet q8h Also at risk hyperchloremic NONAG, currently mostly ag from lactic, which is clearing If NONAG  worsen change to 1/2 NS  GASTROINTESTINAL A:   n/v P:   Treat sepsis Hydration ppi Npo Dc colchicine  HEMATOLOGIC A:   DVT prevention post Knee surgery P:  With renal failure some reluctance to use lovenox with crt clearance also Add sub q hep Concern also bleeding from IR Cbc in am   INFECTIOUS A:   Obstructing uropathy, UTI, septic shock, h/o MRSA, nosocomial exposure Doxy for knee recent P:   Dc ceftriaxone Add stat vanc, ceftaz BC Urine cult Dc doxy  ENDOCRINE A:   At risk hyperglcyemia P:   cbg  NEUROLOGIC A:   Mild septic enceph P:   RASS goal: 0 May need prn fent post IR   FAMILY  - Updates: I updated son and daughter in law in full at bedside  - Inter-disciplinary family meet or Palliative Care meeting due by:    Ccm time 35 min   Lavon Paganini. Titus Mould, MD, [place] Pgr: [facility]  Pulmonary and [place] Pager: [phone]  01/20/2016, 8:28 PM   The entire sepsis bundle had been completed in excellent time frame D/w [facility]

## 2016-01-20 NOTE — Consult Note (Signed)
Urology Consult   Physician requesting consult: Regenia Skeeter  Reason for consult: Right-sided ureteral stones, urinary infection, probable sepsis  History of Present Illness: [name] is a 74 y.o. female who presented to the emergency room here at [facility] earlier today with a two-week history of crampy abdominal pain, frequency, urgency, dysuria. It was felt that the patient may have had a urinary tract infection. Earlier today, she went to the plastic surgeon who recently performed debridement of a left knee wound. She is status post knee replacement in March of this year. The plastic surgeon, Dr. Marla Roe, evaluated her left knee wound and found it to be free of infection. She did recommend patient come to the emergency room, due to her overall condition.  In the emergency room, she was found to be febrile, and CT of the abdomen and pelvis was performed due to her abdominal pain. This revealed mild to moderate right hydroureteronephrosis, 2 stones in the distal ureter-both small, but one at the UVJ and one slightly proximal. The patient was found to have significantly elevated lactic acid level, and is being treated for sepsis. Due to the possibility of a urinary infection with her obstructing stones, urologic consultation is requested.  The patient denies long-standing history of frequent urinary tract infections, and denies any prior history of kidney stones. Medical history significant for prior stroke, hypertension, coronary artery disease, complications after right total knee arthroplasty.    Past Medical History  Diagnosis Date  . CAD (coronary artery disease)   . HTN (hypertension)   . Hyperlipidemia   . Cerebrovascular disease   . Arthritis   . PVD (peripheral vascular disease) (HCC)     dvt in leg  . MRSA (methicillin resistant staph aureus) culture positive   . Wears glasses   . Heart murmur   . History of bronchitis   . Memory impairment   . Complication of  anesthesia     Three days after procedure pt statrted to suffer with memoy loss that took about 4-5 days to come back according to [name]."    Past Surgical History  Procedure Laterality Date  . Knee surgery      denies  . Abdominal hysterectomy    . Total hip arthroplasty Right 09/10/2012    Dr Sharol Given  . Total hip arthroplasty Right 09/10/2012    Procedure: TOTAL HIP ARTHROPLASTY;  Surgeon: Newt Minion, MD;  Location: [place];  Service: Orthopedics;  Laterality: Right;  Right Total Hip Arthroplasty  . Coronary angioplasty  07    3 stents placed  . Total knee arthroplasty Left 09/28/2015    Procedure: TOTAL KNEE ARTHROPLASTY;  Surgeon: Newt Minion, MD;  Location: [place];  Service: Orthopedics;  Laterality: Left;  . I&d knee with poly exchange Left 10/12/2015    Procedure: Irrigation and Debridement , Poly Exchange, Antibiotic Beads Left Knee;  Surgeon: Newt Minion, MD;  Location: [place];  Service: Orthopedics;  Laterality: Left;  . I&d extremity Left 10/18/2015    Procedure: Left Knee Washout, Reclosure;  Surgeon: Newt Minion, MD;  Location: [place];  Service: Orthopedics;  Laterality: Left;  [name] Eye surgery Bilateral     cataracts  . Colonoscopy    . Esophagogastroduodenoscopy    . Skin split graft Left 11/16/2015    Procedure: LEFT GASTROC MUSCLE FLAP WITH SKIN GRAFT SPLIT THICKNESS AND VAC PLACEMENT;  Surgeon: Loel Lofty [name], DO;  Location: [place];  Service: Plastics;  Laterality: Left;  .  Application of wound vac Left 11/16/2015    Procedure: APPLICATION OF WOUND VAC;  Surgeon: Loel Lofty [name], DO;  Location: [place];  Service: Plastics;  Laterality: Left;  . Free flap to extremity Left 11/16/2015    Procedure: FREE FLAP TO EXTREMITY;  Surgeon: Loel Lofty [name], DO;  Location: [place];  Service: Plastics;  Laterality: Left;  . I&d extremity Left 12/08/2015    Procedure: IRRIGATION AND DEBRIDEMENT knee;  Surgeon: Loel Lofty [name], DO;  Location: [place];  Service: Plastics;   Laterality: Left;  . Application of a-cell of extremity Left 12/08/2015    Procedure: APPLICATION OF A-CELL OF knee;  Surgeon: Loel Lofty [name], DO;  Location: [place];  Service: Plastics;  Laterality: Left;  . Application of wound vac Left 12/08/2015    Procedure: APPLICATION OF WOUND VAC  AND CAST to knee;  Surgeon: Loel Lofty [name], DO;  Location: [place];  Service: Plastics;  Laterality: Left;  . Incision and drainage of wound Left 01/04/2016    Procedure: DEBRIDEMENT OF LEFT KNEE WOUND WITH ACELL AND WOUND VAC PLACEMENT;  Surgeon: Loel Lofty [name], DO;  Location: [place];  Service: Plastics;  Laterality: Left;     Current Hospital Medications: Scheduled Meds:  Continuous Infusions: . [START ON 01/21/2016] cefTRIAXone (ROCEPHIN)  IV    . norepinephrine (LEVOPHED) Adult infusion     PRN Meds:.  Allergies: No Known Allergies  Family History  Problem Relation Age of Onset  . Breast cancer Mother   . Leukemia Father   . Stroke Sister   . Bone cancer      Social History:  reports that she quit smoking about 3 months ago. Her smoking use included Cigarettes. She has a 25 pack-year smoking history. She has never used smokeless tobacco. She reports that she does not drink alcohol or use illicit drugs.  ROS: A complete review of systems was performed.  All systems are negative except for pertinent findings as noted.  Physical Exam:  Vital signs in last 24 hours: Temp:  [98.2 F (36.8 C)-103.4 F (39.7 C)] 101.7 F (38.7 C) (06/23 1839) Pulse Rate:  [96-135] 101 (06/23 1915) Resp:  [12-31] 23 (06/23 1915) BP: (80-125)/(45-65) 80/45 mmHg (06/23 1915) SpO2:  [89 %-98 %] 95 % (06/23 1915) Weight:  [48.988 kg (108 lb)] 48.988 kg (108 lb) (06/23 1522) General:  Somewhat lethargic. Answers questions appropriately. HEENT: Normocephalic, atraumatic. Mucous membranes dry. Neck: No JVD or lymphadenopathy Cardiovascular: Tachycardic Lungs: Normal inspiratory and expiratory  excursion Abdomen: Soft, no masses. Right upper, lower quadrant tenderness, right CVA tenderness. Extremities: No edema Neurologic: Grossly intact  Laboratory Data:   Recent Labs  01/20/16 1538  WBC 6.9  HGB 11.5*  HCT 37.7  PLT 389     Recent Labs  01/20/16 1538  NA 136  K 4.7  CL 104  GLUCOSE 110*  BUN 36*  CALCIUM 10.4*  CREATININE 1.52*     Results for orders placed or performed during the hospital encounter of 01/20/16 (from the past 24 hour(s))  Lipase, blood     Status: None   Collection Time: 01/20/16  3:38 PM  Result Value Ref Range   Lipase 26 11 - 51 U/L  Comprehensive metabolic panel     Status: Abnormal   Collection Time: 01/20/16  3:38 PM  Result Value Ref Range   Sodium 136 135 - 145 mmol/L   Potassium 4.7 3.5 - 5.1 mmol/L   Chloride 104 101 - 111 mmol/L  CO2 16 (L) 22 - 32 mmol/L   Glucose, Bld 110 (H) 65 - 99 mg/dL   BUN 36 (H) 6 - 20 mg/dL   Creatinine, Ser 1.52 (H) 0.44 - 1.00 mg/dL   Calcium 10.4 (H) 8.9 - 10.3 mg/dL   Total Protein 6.9 6.5 - 8.1 g/dL   Albumin 2.6 (L) 3.5 - 5.0 g/dL   AST 115 (H) 15 - 41 U/L   ALT 68 (H) 14 - 54 U/L   Alkaline Phosphatase 255 (H) 38 - 126 U/L   Total Bilirubin 1.0 0.3 - 1.2 mg/dL   GFR calc non Af Amer 33 (L) >60 mL/min   GFR calc Af Amer 38 (L) >60 mL/min   Anion gap 16 (H) 5 - 15  CBC     Status: Abnormal   Collection Time: 01/20/16  3:38 PM  Result Value Ref Range   WBC 6.9 4.0 - 10.5 K/uL   RBC 4.42 3.87 - 5.11 MIL/uL   Hemoglobin 11.5 (L) 12.0 - 15.0 g/dL   HCT 37.7 36.0 - 46.0 %   MCV 85.3 78.0 - 100.0 fL   MCH 26.0 26.0 - 34.0 pg   MCHC 30.5 30.0 - 36.0 g/dL   RDW 15.7 (H) 11.5 - 15.5 %   Platelets 389 150 - 400 K/uL  Differential     Status: Abnormal   Collection Time: 01/20/16  3:38 PM  Result Value Ref Range   Neutrophils Relative % 93 %   Neutro Abs 6.3 1.7 - 7.7 K/uL   Lymphocytes Relative 6 %   Lymphs Abs 0.4 (L) 0.7 - 4.0 K/uL   Monocytes Relative 1 %   Monocytes Absolute  0.1 0.1 - 1.0 K/uL   Eosinophils Relative 0 %   Eosinophils Absolute 0.0 0.0 - 0.7 K/uL   Basophils Relative 0 %   Basophils Absolute 0.0 0.0 - 0.1 K/uL  I-Stat Troponin, ED (not at [facility])     Status: None   Collection Time: 01/20/16  3:45 PM  Result Value Ref Range   Troponin i, poc 0.02 0.00 - 0.08 ng/mL   Comment 3          I-Stat CG4 Lactic Acid, ED     Status: Abnormal   Collection Time: 01/20/16  4:48 PM  Result Value Ref Range   Lactic Acid, Venous 7.74 (HH) 0.5 - 2.0 mmol/L   Comment NOTIFIED PHYSICIAN   Urinalysis, Routine w reflex microscopic     Status: Abnormal   Collection Time: 01/20/16  6:32 PM  Result Value Ref Range   Color, Urine AMBER (A) YELLOW   APPearance CLEAR CLEAR   Specific Gravity, Urine 1.014 1.005 - 1.030   pH 7.0 5.0 - 8.0   Glucose, UA NEGATIVE NEGATIVE mg/dL   Hgb urine dipstick MODERATE (A) NEGATIVE   Bilirubin Urine NEGATIVE NEGATIVE   Ketones, ur NEGATIVE NEGATIVE mg/dL   Protein, ur NEGATIVE NEGATIVE mg/dL   Nitrite POSITIVE (A) NEGATIVE   Leukocytes, UA SMALL (A) NEGATIVE  Urine microscopic-add on     Status: Abnormal   Collection Time: 01/20/16  6:32 PM  Result Value Ref Range   Squamous Epithelial / LPF 0-5 (A) NONE SEEN   WBC, UA 6-30 0 - 5 WBC/hpf   RBC / HPF 0-5 0 - 5 RBC/hpf   Bacteria, UA FEW (A) NONE SEEN  I-Stat CG4 Lactic Acid, ED  (not at  [facility])     Status: Abnormal   Collection Time: 01/20/16  7:39 PM  Result Value Ref Range   Lactic Acid, Venous 5.90 (HH) 0.5 - 2.0 mmol/L   Comment NOTIFIED PHYSICIAN    No results found for this or any previous visit (from the past 240 hour(s)).  Renal Function:  Recent Labs  01/20/16 1538  CREATININE 1.52*   Estimated Creatinine Clearance: 25.1 mL/min (by C-G formula based on Cr of 1.52).  Radiologic Imaging: Ct Abdomen Pelvis W Contrast  01/20/2016  CLINICAL DATA:  Right lower quadrant abdominal pain for 5 days with nausea and vomiting. EXAM: CT ABDOMEN AND PELVIS WITH CONTRAST  TECHNIQUE: Multidetector CT imaging of the abdomen and pelvis was performed using the standard protocol following bolus administration of intravenous contrast. CONTRAST:  162m ISOVUE-300 IOPAMIDOL (ISOVUE-300) INJECTION 61% COMPARISON:  08/06/2005 CT abdomen/ pelvis. 02/14/2007 abdominal sonogram. FINDINGS: Lower chest: Centrilobular emphysema. Right middle lobe 3 mm pulmonary nodule (series 205/ image 6) is stable since 2007 and benign. Left main, left anterior descending, left circumflex and right coronary atherosclerosis. Hepatobiliary: Heterogeneous liver parenchymal attenuation, with geographic areas of low attenuation in the left liver lobe. No definite liver surface irregularity. No liver mass. Mild diffuse periportal edema. Numerous subcentimeter calcified gallstones layering in the nondistended gallbladder, with no definite gallbladder wall thickening or pericholecystic fluid. No biliary ductal dilatation. Pancreas: Normal, with no mass or duct dilation. Spleen: Normal size. No mass. Adrenals/Urinary Tract: Mild diffuse irregular thickening of both adrenal glands without discrete adrenal mass, not definitely changed back to 2007, suggesting adrenal hyperplasia . Probable 2 mm stone at the right ureterovesical junction, noting limited visualization of the pelvic segment of the right ureter due to streak artifact from the right hip hardware. Separate 3 mm stone in the mid right pelvic ureter, with mild right hydroureteronephrosis. Additional nonobstructing 4 mm interpolar and 5 mm lower right renal stones. Exophytic simple 3.2 cm renal cyst in the anterior mid to upper right kidney. No left hydronephrosis. Bladder poorly visualized due to streak artifact, with no gross bladder abnormality. Stomach/Bowel: Grossly normal stomach. Normal caliber small bowel with no small bowel wall thickening. Normal appendix. Mild sigmoid diverticulosis, with no large bowel wall thickening or pericolonic fat stranding.  Prominent rectal stool with rectal diameter 8.0 cm. No definite rectal wall thickening. Vascular/Lymphatic: Atherosclerotic nonaneurysmal abdominal aorta. Insert vein No pathologically enlarged lymph nodes in the abdomen or pelvis. Reproductive: Status post hysterectomy, with no gross abnormal findings at the vaginal cuff. No adnexal mass. Other: No pneumoperitoneum, ascites or focal fluid collection. Musculoskeletal: No aggressive appearing focal osseous lesions. Right total hip arthroplasty is partially visualized. Moderate degenerative changes in the visualized thoracolumbar spine. Moderate to marked compression fracture of the T11 vertebral body of indeterminate chronicity, which appears new since the lateral 09/20/2015 chest radiograph. IMPRESSION: 1. Obstructing 2 mm right UVJ stone with mild right hydroureteronephrosis. Separate 3 mm stone in the mid right pelvic ureter. Additional nonobstructing right renal stones. 2. Suggestion of fecal impaction in the rectum. No definite rectal wall thickening. 3. Moderate to severe T11 vertebral compression fracture of indeterminate chronicity, which appears new since 09/20/2015. 4. Nonspecific liver parenchymal heterogeneity without discrete liver mass, favor hepatic steatosis, cannot exclude hepatic fibrosis. 5. Additional findings include left main and 3 vessel coronary atherosclerosis, centrilobular emphysema, mild sigmoid diverticulosis and cholelithiasis. Electronically Signed   By: JIlona SorrelM.D.   On: 01/20/2016 18:38   Dg Chest Portable 1 View  01/20/2016  CLINICAL DATA:  Nausea and vomiting.  Pain. EXAM: PORTABLE CHEST 1 VIEW COMPARISON:  09/20/2015. FINDINGS: Mediastinum  and hilar structures are normal. Mild bilateral upper lobe and bibasilar infiltrates. Small bilateral pleural effusions cannot be excluded. Heart size normal. Degenerative changes both shoulders. IMPRESSION: Mild bilateral upper lobe and bibasilar pulmonary infiltrates consistent with  pneumonia. Electronically Signed   By: Marcello Moores  Register   On: 01/20/2016 17:00    I independently reviewed the above laboratory and imaging studies.  Impression/Assessment:  1. Small but obstructing right distal ureteral stones. She has proximal hydronephrosis. Although urinalysis did not look significantly infected, there is a strong suspicion for proximal infection in her right renal unit.  2. Sepsis-probably due to urinary tract infection and an obstructed right renal unit  Plan:  I agree with critical care consultation and admission  With the patient's presence here at [facility], and her worsening sepsis, I would strongly recommend placement of percutaneous nephrostomy tube as initial management of her obstructed, probably infected right kidney  I discussed the above with the patient, her son and daughter-in-law. We will, following percutaneous drainage of her right renal unit and improvement in her clinical condition, at a later date manage her right ureteral stones.  Eyes spoken with Dr. Vernard Gambles from interventional radiology this evening about the percutaneous procedure.

## 2016-01-20 NOTE — Consult Note (Signed)
Chief Complaint: Right hydronephrosis and sepsis.  Referring Physician(s): Dr. Franchot Gallo  Patient Status: Inpatient  History of Present Illness: [name] is a 75 y.o. female being admitted for septic shock secondary to obstructing right ureteral calculi resulting in right hydronephrosis.  Past Medical History  Diagnosis Date  . CAD (coronary artery disease)   . HTN (hypertension)   . Hyperlipidemia   . Cerebrovascular disease   . Arthritis   . PVD (peripheral vascular disease) (HCC)     dvt in leg  . MRSA (methicillin resistant staph aureus) culture positive   . Wears glasses   . Heart murmur   . History of bronchitis   . Memory impairment   . Complication of anesthesia     Three days after procedure pt statrted to suffer with memoy loss that took about 4-5 days to come back according to pt son [name]."    Past Surgical History  Procedure Laterality Date  . Knee surgery      denies  . Abdominal hysterectomy    . Total hip arthroplasty Right 09/10/2012    Dr Sharol Given  . Total hip arthroplasty Right 09/10/2012    Procedure: TOTAL HIP ARTHROPLASTY;  Surgeon: Newt Minion, MD;  Location: [place];  Service: Orthopedics;  Laterality: Right;  Right Total Hip Arthroplasty  . Coronary angioplasty  07    3 stents placed  . Total knee arthroplasty Left 09/28/2015    Procedure: TOTAL KNEE ARTHROPLASTY;  Surgeon: Newt Minion, MD;  Location: [place];  Service: Orthopedics;  Laterality: Left;  . I&d knee with poly exchange Left 10/12/2015    Procedure: Irrigation and Debridement , Poly Exchange, Antibiotic Beads Left Knee;  Surgeon: Newt Minion, MD;  Location: [place];  Service: Orthopedics;  Laterality: Left;  . I&d extremity Left 10/18/2015    Procedure: Left Knee Washout, Reclosure;  Surgeon: Newt Minion, MD;  Location: [place];  Service: Orthopedics;  Laterality: Left;  [name] Eye surgery Bilateral     cataracts  . Colonoscopy    . Esophagogastroduodenoscopy    . Skin  split graft Left 11/16/2015    Procedure: LEFT GASTROC MUSCLE FLAP WITH SKIN GRAFT SPLIT THICKNESS AND VAC PLACEMENT;  Surgeon: Loel Lofty [name], DO;  Location: [place];  Service: Plastics;  Laterality: Left;  . Application of wound vac Left 11/16/2015    Procedure: APPLICATION OF WOUND VAC;  Surgeon: Loel Lofty [name], DO;  Location: [place];  Service: Plastics;  Laterality: Left;  . Free flap to extremity Left 11/16/2015    Procedure: FREE FLAP TO EXTREMITY;  Surgeon: Loel Lofty [name], DO;  Location: [place];  Service: Plastics;  Laterality: Left;  . I&d extremity Left 12/08/2015    Procedure: IRRIGATION AND DEBRIDEMENT knee;  Surgeon: Loel Lofty [name], DO;  Location: [place];  Service: Plastics;  Laterality: Left;  . Application of a-cell of extremity Left 12/08/2015    Procedure: APPLICATION OF A-CELL OF knee;  Surgeon: Loel Lofty [name], DO;  Location: [place];  Service: Plastics;  Laterality: Left;  . Application of wound vac Left 12/08/2015    Procedure: APPLICATION OF WOUND VAC  AND CAST to knee;  Surgeon: Loel Lofty [name], DO;  Location: [place];  Service: Plastics;  Laterality: Left;  . Incision and drainage of wound Left 01/04/2016    Procedure: DEBRIDEMENT OF LEFT KNEE WOUND WITH ACELL AND WOUND VAC PLACEMENT;  Surgeon: Loel Lofty [name], DO;  Location: [place];  Service: Clinical cytogeneticist;  Laterality: Left;    Allergies: Review of patient's allergies indicates no known allergies.  Medications: Prior to Admission medications   Medication Sig Start Date End Date Taking? Authorizing Provider  amLODipine (NORVASC) 10 MG tablet TAKE 1 TABLET (10 MG TOTAL) BY MOUTH DAILY. Patient taking differently: Take 10 mg by mouth daily.  08/27/14  Yes Lelon Perla, MD  aspirin EC 81 MG tablet Take 81 mg by mouth daily.   Yes Historical Provider, MD  atorvastatin (LIPITOR) 40 MG tablet TAKE ONE TABLET BY MOUTH DAILY. Patient taking differently: Take 40 mg by mouth daily.  09/15/15  Yes Lelon Perla,  MD  budesonide-formoterol (SYMBICORT) 160-4.5 MCG/ACT inhaler Inhale 2 puffs into the lungs 2 (two) times daily.   Yes Historical Provider, MD  colchicine 0.6 MG tablet Take 0.6 mg by mouth daily as needed (gout flare).  08/30/15  Yes Historical Provider, MD  diclofenac (VOLTAREN) 75 MG EC tablet Take 75 mg by mouth 2 (two) times daily.  12/18/11  Yes Historical Provider, MD  doxycycline (VIBRA-TABS) 100 MG tablet Take 1 tablet (100 mg total) by mouth 2 (two) times daily. Patient taking differently: Take 100 mg by mouth 2 (two) times daily. Continuous course until  05/25/16 11/24/15 05/25/16 Yes Thayer Headings, MD  Glucosamine-Chondroit-Vit C-Mn (GLUCOSAMINE-CHONDROITIN) CAPS Take 1 capsule by mouth daily.   Yes Historical Provider, MD  hydrochlorothiazide (MICROZIDE) 12.5 MG capsule TAKE 1 CAPSULE (12.5 MG TOTAL) BY MOUTH EVERY MORNING. Patient taking differently: Take 12.5 mg by mouth daily.  08/27/14  Yes Lelon Perla, MD  lisinopril (PRINIVIL,ZESTRIL) 40 MG tablet TAKE 1 TABLET (40 MG TOTAL) BY MOUTH DAILY. Patient taking differently: Take 40 mg by mouth daily.  08/27/14  Yes Lelon Perla, MD  Multiple Vitamin (MULITIVITAMIN WITH MINERALS) TABS Take 1 tablet by mouth daily.   Yes Historical Provider, MD  nitrofurantoin, macrocrystal-monohydrate, (MACROBID) 100 MG capsule Take 100 mg by mouth 2 (two) times daily.   Yes Historical Provider, MD  polyethylene glycol (MIRALAX / GLYCOLAX) packet Take 17 g by mouth daily. Patient taking differently: Take 17 g by mouth daily as needed for moderate constipation.  11/17/15  Yes Shawn Montgomery Rayburn, PA-C  traMADol (ULTRAM) 50 MG tablet Take 50 mg by mouth every 12 (twelve) hours as needed for moderate pain.    Yes Historical Provider, MD  enoxaparin (LOVENOX) 30 MG/0.3ML injection Inject 0.3 mLs (30 mg total) into the skin daily. Patient taking differently: Inject 30 mg into the skin daily with supper.  11/17/15   Shawn Montgomery Rayburn, PA-C      Family History  Problem Relation Age of Onset  . Breast cancer Mother   . Leukemia Father   . Stroke Sister   . Bone cancer      Social History   Social History  . Marital Status: Single    Spouse Name: N/A  . Number of Children: N/A  . Years of Education: N/A   Social History Main Topics  . Smoking status: Former Smoker -- 0.50 packs/day for 50 years    Types: Cigarettes    Quit date: 09/28/2015  . Smokeless tobacco: Never Used     Comment: quit 09/27/15  . Alcohol Use: No  . Drug Use: No  . Sexual Activity: No   Other Topics Concern  . None   Social History Narrative    Review of Systems: A 12 point ROS discussed and pertinent positives are indicated in the HPI  above.  All other systems are negative.  Review of Systems  Constitutional: Positive for fever and chills.  Respiratory: Negative.   Cardiovascular: Negative.   Gastrointestinal: Positive for abdominal pain.  Genitourinary: Positive for dysuria, urgency, frequency and flank pain.  Musculoskeletal: Negative.     Vital Signs: BP 94/52 mmHg  Pulse 92  Temp(Src) 101.7 F (38.7 C) (Rectal)  Resp 26  Ht 5' 2.5" (1.588 m)  Wt 108 lb (48.988 kg)  BMI 19.43 kg/m2  SpO2 93%  Physical Exam  Mallampati Score:  MD Evaluation Airway: WNL Heart: WNL Abdomen: WNL Chest/ Lungs: WNL ASA  Classification: 3 Mallampati/Airway Score: One  Imaging: Ct Abdomen Pelvis W Contrast  01/20/2016  CLINICAL DATA:  Right lower quadrant abdominal pain for 5 days with nausea and vomiting. EXAM: CT ABDOMEN AND PELVIS WITH CONTRAST TECHNIQUE: Multidetector CT imaging of the abdomen and pelvis was performed using the standard protocol following bolus administration of intravenous contrast. CONTRAST:  197m ISOVUE-300 IOPAMIDOL (ISOVUE-300) INJECTION 61% COMPARISON:  08/06/2005 CT abdomen/ pelvis. 02/14/2007 abdominal sonogram. FINDINGS: Lower chest: Centrilobular emphysema. Right middle lobe 3 mm pulmonary nodule (series  205/ image 6) is stable since 2007 and benign. Left main, left anterior descending, left circumflex and right coronary atherosclerosis. Hepatobiliary: Heterogeneous liver parenchymal attenuation, with geographic areas of low attenuation in the left liver lobe. No definite liver surface irregularity. No liver mass. Mild diffuse periportal edema. Numerous subcentimeter calcified gallstones layering in the nondistended gallbladder, with no definite gallbladder wall thickening or pericholecystic fluid. No biliary ductal dilatation. Pancreas: Normal, with no mass or duct dilation. Spleen: Normal size. No mass. Adrenals/Urinary Tract: Mild diffuse irregular thickening of both adrenal glands without discrete adrenal mass, not definitely changed back to 2007, suggesting adrenal hyperplasia . Probable 2 mm stone at the right ureterovesical junction, noting limited visualization of the pelvic segment of the right ureter due to streak artifact from the right hip hardware. Separate 3 mm stone in the mid right pelvic ureter, with mild right hydroureteronephrosis. Additional nonobstructing 4 mm interpolar and 5 mm lower right renal stones. Exophytic simple 3.2 cm renal cyst in the anterior mid to upper right kidney. No left hydronephrosis. Bladder poorly visualized due to streak artifact, with no gross bladder abnormality. Stomach/Bowel: Grossly normal stomach. Normal caliber small bowel with no small bowel wall thickening. Normal appendix. Mild sigmoid diverticulosis, with no large bowel wall thickening or pericolonic fat stranding. Prominent rectal stool with rectal diameter 8.0 cm. No definite rectal wall thickening. Vascular/Lymphatic: Atherosclerotic nonaneurysmal abdominal aorta. Insert vein No pathologically enlarged lymph nodes in the abdomen or pelvis. Reproductive: Status post hysterectomy, with no gross abnormal findings at the vaginal cuff. No adnexal mass. Other: No pneumoperitoneum, ascites or focal fluid  collection. Musculoskeletal: No aggressive appearing focal osseous lesions. Right total hip arthroplasty is partially visualized. Moderate degenerative changes in the visualized thoracolumbar spine. Moderate to marked compression fracture of the T11 vertebral body of indeterminate chronicity, which appears new since the lateral 09/20/2015 chest radiograph. IMPRESSION: 1. Obstructing 2 mm right UVJ stone with mild right hydroureteronephrosis. Separate 3 mm stone in the mid right pelvic ureter. Additional nonobstructing right renal stones. 2. Suggestion of fecal impaction in the rectum. No definite rectal wall thickening. 3. Moderate to severe T11 vertebral compression fracture of indeterminate chronicity, which appears new since 09/20/2015. 4. Nonspecific liver parenchymal heterogeneity without discrete liver mass, favor hepatic steatosis, cannot exclude hepatic fibrosis. 5. Additional findings include left main and 3 vessel coronary atherosclerosis, centrilobular emphysema, mild  sigmoid diverticulosis and cholelithiasis. Electronically Signed   By: Ilona Sorrel M.D.   On: 01/20/2016 18:38   Dg Chest Portable 1 View  01/20/2016  CLINICAL DATA:  Central line placement EXAM: PORTABLE CHEST 1 VIEW COMPARISON:  6/23/ 17 FINDINGS: Cardiomediastinal silhouette is stable. Streaky interstitial prominence/ pneumonitis bilateral lower lobe and right upper lobe again noted. There is right IJ central line with tip in SVC. No pneumothorax. IMPRESSION: Right IJ central line in place.  No pneumothorax. Electronically Signed   By: Lahoma Crocker M.D.   On: 01/20/2016 20:51   Dg Chest Portable 1 View  01/20/2016  CLINICAL DATA:  Nausea and vomiting.  Pain. EXAM: PORTABLE CHEST 1 VIEW COMPARISON:  09/20/2015. FINDINGS: Mediastinum and hilar structures are normal. Mild bilateral upper lobe and bibasilar infiltrates. Small bilateral pleural effusions cannot be excluded. Heart size normal. Degenerative changes both shoulders.  IMPRESSION: Mild bilateral upper lobe and bibasilar pulmonary infiltrates consistent with pneumonia. Electronically Signed   By: Marcello Moores  Register   On: 01/20/2016 17:00    Labs:  CBC:  Recent Labs  11/15/15 1435 12/08/15 1141 01/04/16 1204 01/20/16 1538  WBC 8.3 7.3 9.1 6.9  HGB 9.4* 9.6* 10.0* 11.5*  HCT 27.4* 30.3* 30.2* 37.7  PLT 402* 416* PLATELET CLUMPS NOTED ON SMEAR, COUNT APPEARS ADEQUATE 389    COAGS:  Recent Labs  09/20/15 1558 01/04/16 1204  INR 1.11 1.15  APTT 37  --     BMP:  Recent Labs  11/15/15 1435 12/08/15 1141 01/04/16 1204 01/20/16 1538  NA 135 137 138 136  K 4.2 4.8 4.6 4.7  CL 101 105 106 104  CO2 22 19* 21* 16*  GLUCOSE 153* 107* 107* 110*  BUN 9 35* 28* 36*  CALCIUM 9.4 9.4 10.0 10.4*  CREATININE 1.03* 1.45* 0.90 1.52*  GFRNONAA 52* 35* >60 33*  GFRAA >60 40* >60 38*    LIVER FUNCTION TESTS:  Recent Labs  09/20/15 1558 01/20/16 1538  BILITOT 0.3 1.0  AST 22 115*  ALT 18 68*  ALKPHOS 115 255*  PROT 7.4 6.9  ALBUMIN 2.8* 2.6*    Assessment and Plan:  For emergent right percutaneous nephrostomy tube placement tonight to relieve right renal obstruction as source of sepsis.  Consent obtained from patient's son. Risks and benefits discussed with the patient including, but not limited to infection, bleeding, significant bleeding causing loss or decrease in renal function or damage to adjacent structures.  All of the patient's son's questions were answered, patient's son is agreeable to proceed. Consent signed and in chart.  Electronically SignedAletta Edouard T 01/20/2016, 9:34 PM

## 2016-01-21 ENCOUNTER — Inpatient Hospital Stay (HOSPITAL_COMMUNITY): Payer: Medicare Other

## 2016-01-21 ENCOUNTER — Other Ambulatory Visit: Payer: Self-pay

## 2016-01-21 DIAGNOSIS — L8990 Pressure ulcer of unspecified site, unspecified stage: Secondary | ICD-10-CM | POA: Insufficient documentation

## 2016-01-21 DIAGNOSIS — N12 Tubulo-interstitial nephritis, not specified as acute or chronic: Secondary | ICD-10-CM

## 2016-01-21 LAB — BASIC METABOLIC PANEL
ANION GAP: 11 (ref 5–15)
ANION GAP: 9 (ref 5–15)
Anion gap: 10 (ref 5–15)
Anion gap: 10 (ref 5–15)
BUN: 35 mg/dL — ABNORMAL HIGH (ref 6–20)
BUN: 34 mg/dL — ABNORMAL HIGH (ref 6–20)
BUN: 30 mg/dL — AB (ref 6–20)
BUN: 28 mg/dL — AB (ref 6–20)
CALCIUM: 8.5 mg/dL — AB (ref 8.9–10.3)
CHLORIDE: 105 mmol/L (ref 101–111)
CO2: 17 mmol/L — ABNORMAL LOW (ref 22–32)
CO2: 18 mmol/L — AB (ref 22–32)
CO2: 16 mmol/L — ABNORMAL LOW (ref 22–32)
CO2: 17 mmol/L — ABNORMAL LOW (ref 22–32)
CREATININE: 1.36 mg/dL — AB (ref 0.44–1.00)
CREATININE: 0.94 mg/dL (ref 0.44–1.00)
Calcium: 8.4 mg/dL — ABNORMAL LOW (ref 8.9–10.3)
Calcium: 7.8 mg/dL — ABNORMAL LOW (ref 8.9–10.3)
Calcium: 8.1 mg/dL — ABNORMAL LOW (ref 8.9–10.3)
Chloride: 105 mmol/L (ref 101–111)
Chloride: 110 mmol/L (ref 101–111)
Chloride: 108 mmol/L (ref 101–111)
Creatinine, Ser: 1.43 mg/dL — ABNORMAL HIGH (ref 0.44–1.00)
Creatinine, Ser: 0.99 mg/dL (ref 0.44–1.00)
GFR calc non Af Amer: 35 mL/min — ABNORMAL LOW (ref >60)
GFR calc non Af Amer: 37 mL/min — ABNORMAL LOW (ref >60)
GFR, EST AFRICAN AMERICAN: 41 mL/min — AB (ref >60)
GFR, EST AFRICAN AMERICAN: 43 mL/min — AB (ref >60)
GFR, EST NON AFRICAN AMERICAN: 55 mL/min — AB (ref >60)
GFR, EST NON AFRICAN AMERICAN: 58 mL/min — AB (ref >60)
GLUCOSE: 161 mg/dL — AB (ref 65–99)
Glucose, Bld: 169 mg/dL — ABNORMAL HIGH (ref 65–99)
Glucose, Bld: 104 mg/dL — ABNORMAL HIGH (ref 65–99)
Glucose, Bld: 133 mg/dL — ABNORMAL HIGH (ref 65–99)
POTASSIUM: 3.4 mmol/L — AB (ref 3.5–5.1)
POTASSIUM: 3.9 mmol/L (ref 3.5–5.1)
Potassium: 3.5 mmol/L (ref 3.5–5.1)
Potassium: 3.6 mmol/L (ref 3.5–5.1)
SODIUM: 133 mmol/L — AB (ref 135–145)
SODIUM: 132 mmol/L — AB (ref 135–145)
SODIUM: 136 mmol/L (ref 135–145)
SODIUM: 135 mmol/L (ref 135–145)

## 2016-01-21 LAB — BLOOD GAS, ARTERIAL
ACID-BASE DEFICIT: 7.9 mmol/L — AB (ref 0.0–2.0)
Bicarbonate: 16.6 mEq/L — ABNORMAL LOW (ref 20.0–24.0)
DRAWN BY: 42624
O2 CONTENT: 1 L/min
O2 Saturation: 93 %
PATIENT TEMPERATURE: 98.6
PCO2 ART: 30.5 mmHg — AB (ref 35.0–45.0)
PH ART: 7.353 (ref 7.350–7.450)
TCO2: 17.5 mmol/L (ref 0–100)
pO2, Arterial: 73.4 mmHg — ABNORMAL LOW (ref 80.0–100.0)

## 2016-01-21 LAB — TYPE AND SCREEN
ABO/RH(D): O POS
Antibody Screen: NEGATIVE

## 2016-01-21 LAB — BLOOD CULTURE ID PANEL (REFLEXED)
ACINETOBACTER BAUMANNII: NOT DETECTED
CANDIDA GLABRATA: NOT DETECTED
CANDIDA KRUSEI: NOT DETECTED
CANDIDA TROPICALIS: NOT DETECTED
CARBAPENEM RESISTANCE: NOT DETECTED
Candida albicans: NOT DETECTED
Candida parapsilosis: NOT DETECTED
ESCHERICHIA COLI: NOT DETECTED
Enterobacter cloacae complex: NOT DETECTED
Enterobacteriaceae species: DETECTED — AB
Enterococcus species: NOT DETECTED
HAEMOPHILUS INFLUENZAE: NOT DETECTED
KLEBSIELLA OXYTOCA: NOT DETECTED
Klebsiella pneumoniae: NOT DETECTED
LISTERIA MONOCYTOGENES: NOT DETECTED
Methicillin resistance: NOT DETECTED
Neisseria meningitidis: NOT DETECTED
PROTEUS SPECIES: DETECTED — AB
Pseudomonas aeruginosa: NOT DETECTED
SERRATIA MARCESCENS: NOT DETECTED
STAPHYLOCOCCUS SPECIES: NOT DETECTED
STREPTOCOCCUS PYOGENES: NOT DETECTED
Staphylococcus aureus (BCID): NOT DETECTED
Streptococcus agalactiae: NOT DETECTED
Streptococcus pneumoniae: NOT DETECTED
Streptococcus species: NOT DETECTED
Vancomycin resistance: NOT DETECTED

## 2016-01-21 LAB — TROPONIN I
TROPONIN I: 0.07 ng/mL — AB (ref <0.031)
TROPONIN I: 0.52 ng/mL — AB (ref <0.031)

## 2016-01-21 LAB — CBC
HEMATOCRIT: 29.6 % — AB (ref 36.0–46.0)
HEMATOCRIT: 29.8 % — AB (ref 36.0–46.0)
HEMOGLOBIN: 9.2 g/dL — AB (ref 12.0–15.0)
HEMOGLOBIN: 9.3 g/dL — AB (ref 12.0–15.0)
MCH: 27.4 pg (ref 26.0–34.0)
MCH: 26.2 pg (ref 26.0–34.0)
MCHC: 31.1 g/dL (ref 30.0–36.0)
MCHC: 31.2 g/dL (ref 30.0–36.0)
MCV: 88.1 fL (ref 78.0–100.0)
MCV: 83.9 fL (ref 78.0–100.0)
Platelets: 200 10*3/uL (ref 150–400)
Platelets: 179 10*3/uL (ref 150–400)
RBC: 3.36 MIL/uL — AB (ref 3.87–5.11)
RBC: 3.55 MIL/uL — AB (ref 3.87–5.11)
RDW: 16.2 % — ABNORMAL HIGH (ref 11.5–15.5)
RDW: 16.1 % — ABNORMAL HIGH (ref 11.5–15.5)
WBC: 46.1 10*3/uL — ABNORMAL HIGH (ref 4.0–10.5)
WBC: 57.1 10*3/uL — AB (ref 4.0–10.5)

## 2016-01-21 LAB — MAGNESIUM: Magnesium: 1.9 mg/dL (ref 1.7–2.4)

## 2016-01-21 LAB — GLUCOSE, CAPILLARY
GLUCOSE-CAPILLARY: 168 mg/dL — AB (ref 65–99)
GLUCOSE-CAPILLARY: 140 mg/dL — AB (ref 65–99)
GLUCOSE-CAPILLARY: 112 mg/dL — AB (ref 65–99)
Glucose-Capillary: 186 mg/dL — ABNORMAL HIGH (ref 65–99)
Glucose-Capillary: 112 mg/dL — ABNORMAL HIGH (ref 65–99)
Glucose-Capillary: 118 mg/dL — ABNORMAL HIGH (ref 65–99)

## 2016-01-21 LAB — LACTIC ACID, PLASMA
LACTIC ACID, VENOUS: 3.1 mmol/L — AB (ref 0.5–2.0)
LACTIC ACID, VENOUS: 3 mmol/L — AB (ref 0.5–2.0)

## 2016-01-21 LAB — CREATININE, SERUM
Creatinine, Ser: 1.45 mg/dL — ABNORMAL HIGH (ref 0.44–1.00)
GFR calc Af Amer: 40 mL/min — ABNORMAL LOW (ref >60)
GFR calc non Af Amer: 35 mL/min — ABNORMAL LOW (ref >60)

## 2016-01-21 LAB — PHOSPHORUS: Phosphorus: 4.2 mg/dL (ref 2.5–4.6)

## 2016-01-21 LAB — MRSA PCR SCREENING: MRSA BY PCR: NEGATIVE

## 2016-01-21 LAB — CORTISOL: CORTISOL PLASMA: 31.9 ug/dL

## 2016-01-21 MED ORDER — POTASSIUM CHLORIDE CRYS ER 20 MEQ PO TBCR
40.0000 meq | EXTENDED_RELEASE_TABLET | Freq: Once | ORAL | Status: AC
  Administered 2016-01-21: 40 meq via ORAL
  Filled 2016-01-21: qty 2

## 2016-01-21 MED ORDER — LACTATED RINGERS IV BOLUS (SEPSIS)
1000.0000 mL | Freq: Once | INTRAVENOUS | Status: AC
  Administered 2016-01-21: 1000 mL via INTRAVENOUS

## 2016-01-21 MED ORDER — FUROSEMIDE 10 MG/ML IJ SOLN
40.0000 mg | Freq: Once | INTRAMUSCULAR | Status: AC
  Administered 2016-01-21: 40 mg via INTRAVENOUS
  Filled 2016-01-21 (×2): qty 4

## 2016-01-21 MED ORDER — SODIUM BICARBONATE 8.4 % IV SOLN
INTRAVENOUS | Status: DC
  Administered 2016-01-21 – 2016-01-22 (×2): via INTRAVENOUS
  Filled 2016-01-21 (×4): qty 150

## 2016-01-21 MED ORDER — VASOPRESSIN 20 UNIT/ML IV SOLN
0.0300 [IU]/min | INTRAVENOUS | Status: DC
  Administered 2016-01-21 – 2016-01-22 (×2): 0.03 [IU]/min via INTRAVENOUS
  Filled 2016-01-21 (×2): qty 2

## 2016-01-21 MED ORDER — HEPARIN (PORCINE) IN NACL 100-0.45 UNIT/ML-% IJ SOLN
1100.0000 [IU]/h | INTRAMUSCULAR | Status: DC
  Administered 2016-01-22: 600 [IU]/h via INTRAVENOUS
  Administered 2016-01-23: 800 [IU]/h via INTRAVENOUS
  Filled 2016-01-21 (×3): qty 250

## 2016-01-21 MED ORDER — NOREPINEPHRINE BITARTRATE 1 MG/ML IV SOLN
5.0000 ug/min | INTRAVENOUS | Status: DC
  Administered 2016-01-21: 20 ug/min via INTRAVENOUS
  Administered 2016-01-21: 30 ug/min via INTRAVENOUS
  Administered 2016-01-22: 5 ug/min via INTRAVENOUS
  Filled 2016-01-21 (×5): qty 16

## 2016-01-21 MED ORDER — MORPHINE SULFATE (PF) 2 MG/ML IV SOLN
2.0000 mg | INTRAVENOUS | Status: DC | PRN
  Administered 2016-01-21 – 2016-01-28 (×11): 2 mg via INTRAVENOUS
  Filled 2016-01-21 (×11): qty 1

## 2016-01-21 MED ORDER — ACETAMINOPHEN 325 MG PO TABS
650.0000 mg | ORAL_TABLET | Freq: Four times a day (QID) | ORAL | Status: DC | PRN
  Administered 2016-01-23 – 2016-01-29 (×6): 650 mg via ORAL
  Filled 2016-01-21 (×6): qty 2

## 2016-01-21 MED ORDER — HEPARIN BOLUS VIA INFUSION
2000.0000 [IU] | Freq: Once | INTRAVENOUS | Status: AC
  Administered 2016-01-22: 2000 [IU] via INTRAVENOUS
  Filled 2016-01-21: qty 2000

## 2016-01-21 MED ORDER — DEXTROSE 5 % IV SOLN
2.0000 g | Freq: Two times a day (BID) | INTRAVENOUS | Status: DC
  Administered 2016-01-21 – 2016-01-22 (×3): 2 g via INTRAVENOUS
  Filled 2016-01-21 (×5): qty 2

## 2016-01-21 MED ORDER — MORPHINE SULFATE (PF) 2 MG/ML IV SOLN
INTRAVENOUS | Status: AC
  Filled 2016-01-21: qty 1

## 2016-01-21 MED ORDER — FENTANYL CITRATE (PF) 100 MCG/2ML IJ SOLN
12.5000 ug | INTRAMUSCULAR | Status: DC | PRN
  Administered 2016-01-21 (×3): 50 ug via INTRAVENOUS
  Administered 2016-01-21: 25 ug via INTRAVENOUS
  Administered 2016-01-21 (×2): 50 ug via INTRAVENOUS
  Filled 2016-01-21 (×6): qty 2

## 2016-01-21 NOTE — Progress Notes (Signed)
Subjective: Patient reports no specific pain. Appreciate help from Dr Kathlene Cote re: PCN tube placement last pm.  Objective: Vital signs in last 24 hours: Temp:  [97.1 F (36.2 C)-103.4 F (39.7 C)] 97.1 F (36.2 C) (06/24 0833) Pulse Rate:  [48-135] 79 (06/24 1000) Resp:  [6-31] 11 (06/24 1000) BP: (68-125)/(36-71) 111/60 mmHg (06/24 1000) SpO2:  [89 %-100 %] 94 % (06/24 1000) Weight:  [48.988 kg (108 lb)-51.9 kg (114 lb 6.7 oz)] 51.9 kg (114 lb 6.7 oz) (06/24 0130)  Intake/Output from previous day: 06/23 0701 - 06/24 0700 In: 2086.7 [I.V.:2086.7] Out: 345 [Urine:345] Intake/Output this shift: Total I/O In: 133.8 [I.V.:133.8] Out: -   Physical Exam:  Constitutional: Vital signs reviewed. WD WN alert Eyes: PERRL, No scleral icterus.   Cardiovascular: RRR Pulmonary/Chest: Normal effort Abdominal: Soft. Non-tender   Neph tube draining well Lab Results:  Recent Labs  01/20/16 1538 01/21/16 0130  HGB 11.5* 9.2*  HCT 37.7 29.6*   BMET  Recent Labs  01/21/16 0209 01/21/16 0512  NA 133* 132*  K 3.5 3.6  CL 105 105  CO2 17* 18*  GLUCOSE 161* 169*  BUN 35* 34*  CREATININE 1.43* 1.36*  CALCIUM 8.5* 8.4*   No results for input(s): LABPT, INR in the last 72 hours. No results for input(s): LABURIN in the last 72 hours. Results for orders placed or performed during the hospital encounter of 01/20/16  Blood Culture (routine x 2)     Status: None (Preliminary result)   Collection Time: 01/20/16  4:30 PM  Result Value Ref Range Status   Specimen Description BLOOD RIGHT HAND  Final   Special Requests BOTTLES DRAWN AEROBIC ONLY 5CC  Final   Culture  Setup Time   Final    GRAM NEGATIVE RODS AEROBIC BOTTLE ONLY CRITICAL RESULT CALLED TO, READ BACK BY AND VERIFIED WITH: K COMBS,PHARMD AT 0743 01/21/16 BY L BENFIELD    Culture PENDING  Incomplete   Report Status PENDING  Incomplete  Blood Culture (routine x 2)     Status: None (Preliminary result)   Collection Time:  01/20/16  4:40 PM  Result Value Ref Range Status   Specimen Description BLOOD LEFT ANTECUBITAL  Final   Special Requests BOTTLES DRAWN AEROBIC AND ANAEROBIC 5CC  Final   Culture  Setup Time   Final    GRAM NEGATIVE RODS IN BOTH AEROBIC AND ANAEROBIC BOTTLES Organism ID to follow CRITICAL RESULT CALLED TO, READ BACK BY AND VERIFIED WITH: K COMBS,PHARMD AT 0743 01/21/16 BY L BENFIELD    Culture PENDING  Incomplete   Report Status PENDING  Incomplete  Blood Culture ID Panel (Reflexed)     Status: Abnormal   Collection Time: 01/20/16  4:40 PM  Result Value Ref Range Status   Enterococcus species NOT DETECTED NOT DETECTED Final   Vancomycin resistance NOT DETECTED NOT DETECTED Final   Listeria monocytogenes NOT DETECTED NOT DETECTED Final   Staphylococcus species NOT DETECTED NOT DETECTED Final   Staphylococcus aureus NOT DETECTED NOT DETECTED Final   Methicillin resistance NOT DETECTED NOT DETECTED Final   Streptococcus species NOT DETECTED NOT DETECTED Final   Streptococcus agalactiae NOT DETECTED NOT DETECTED Final   Streptococcus pneumoniae NOT DETECTED NOT DETECTED Final   Streptococcus pyogenes NOT DETECTED NOT DETECTED Final   Acinetobacter baumannii NOT DETECTED NOT DETECTED Final   Enterobacteriaceae species DETECTED (A) NOT DETECTED Final    Comment: CRITICAL RESULT CALLED TO, READ BACK BY AND VERIFIED WITH: K COMBS,PHARMD AT 3664 01/21/16  BY L BENFIELD    Enterobacter cloacae complex NOT DETECTED NOT DETECTED Final   Escherichia coli NOT DETECTED NOT DETECTED Final   Klebsiella oxytoca NOT DETECTED NOT DETECTED Final   Klebsiella pneumoniae NOT DETECTED NOT DETECTED Final   Proteus species DETECTED (A) NOT DETECTED Final    Comment: CRITICAL RESULT CALLED TO, READ BACK BY AND VERIFIED WITH: K COMBS,PHARMD AT 0743 01/21/16 BY L BENFIELD    Serratia marcescens NOT DETECTED NOT DETECTED Final   Carbapenem resistance NOT DETECTED NOT DETECTED Final   Haemophilus influenzae NOT  DETECTED NOT DETECTED Final   Neisseria meningitidis NOT DETECTED NOT DETECTED Final   Pseudomonas aeruginosa NOT DETECTED NOT DETECTED Final   Candida albicans NOT DETECTED NOT DETECTED Final   Candida glabrata NOT DETECTED NOT DETECTED Final   Candida krusei NOT DETECTED NOT DETECTED Final   Candida parapsilosis NOT DETECTED NOT DETECTED Final   Candida tropicalis NOT DETECTED NOT DETECTED Final  MRSA PCR Screening     Status: None   Collection Time: 01/21/16  1:00 AM  Result Value Ref Range Status   MRSA by PCR NEGATIVE NEGATIVE Final    Comment:        The GeneXpert MRSA Assay (FDA approved for NASAL specimens only), is one component of a comprehensive MRSA colonization surveillance program. It is not intended to diagnose MRSA infection nor to guide or monitor treatment for MRSA infections.     Studies/Results: Ct Abdomen Pelvis W Contrast  01/20/2016  CLINICAL DATA:  Right lower quadrant abdominal pain for 5 days with nausea and vomiting. EXAM: CT ABDOMEN AND PELVIS WITH CONTRAST TECHNIQUE: Multidetector CT imaging of the abdomen and pelvis was performed using the standard protocol following bolus administration of intravenous contrast. CONTRAST:  120m ISOVUE-300 IOPAMIDOL (ISOVUE-300) INJECTION 61% COMPARISON:  08/06/2005 CT abdomen/ pelvis. 02/14/2007 abdominal sonogram. FINDINGS: Lower chest: Centrilobular emphysema. Right middle lobe 3 mm pulmonary nodule (series 205/ image 6) is stable since 2007 and benign. Left main, left anterior descending, left circumflex and right coronary atherosclerosis. Hepatobiliary: Heterogeneous liver parenchymal attenuation, with geographic areas of low attenuation in the left liver lobe. No definite liver surface irregularity. No liver mass. Mild diffuse periportal edema. Numerous subcentimeter calcified gallstones layering in the nondistended gallbladder, with no definite gallbladder wall thickening or pericholecystic fluid. No biliary ductal  dilatation. Pancreas: Normal, with no mass or duct dilation. Spleen: Normal size. No mass. Adrenals/Urinary Tract: Mild diffuse irregular thickening of both adrenal glands without discrete adrenal mass, not definitely changed back to 2007, suggesting adrenal hyperplasia . Probable 2 mm stone at the right ureterovesical junction, noting limited visualization of the pelvic segment of the right ureter due to streak artifact from the right hip hardware. Separate 3 mm stone in the mid right pelvic ureter, with mild right hydroureteronephrosis. Additional nonobstructing 4 mm interpolar and 5 mm lower right renal stones. Exophytic simple 3.2 cm renal cyst in the anterior mid to upper right kidney. No left hydronephrosis. Bladder poorly visualized due to streak artifact, with no gross bladder abnormality. Stomach/Bowel: Grossly normal stomach. Normal caliber small bowel with no small bowel wall thickening. Normal appendix. Mild sigmoid diverticulosis, with no large bowel wall thickening or pericolonic fat stranding. Prominent rectal stool with rectal diameter 8.0 cm. No definite rectal wall thickening. Vascular/Lymphatic: Atherosclerotic nonaneurysmal abdominal aorta. Insert vein No pathologically enlarged lymph nodes in the abdomen or pelvis. Reproductive: Status post hysterectomy, with no gross abnormal findings at the vaginal cuff. No adnexal mass. Other: No pneumoperitoneum,  ascites or focal fluid collection. Musculoskeletal: No aggressive appearing focal osseous lesions. Right total hip arthroplasty is partially visualized. Moderate degenerative changes in the visualized thoracolumbar spine. Moderate to marked compression fracture of the T11 vertebral body of indeterminate chronicity, which appears new since the lateral 09/20/2015 chest radiograph. IMPRESSION: 1. Obstructing 2 mm right UVJ stone with mild right hydroureteronephrosis. Separate 3 mm stone in the mid right pelvic ureter. Additional nonobstructing right  renal stones. 2. Suggestion of fecal impaction in the rectum. No definite rectal wall thickening. 3. Moderate to severe T11 vertebral compression fracture of indeterminate chronicity, which appears new since 09/20/2015. 4. Nonspecific liver parenchymal heterogeneity without discrete liver mass, favor hepatic steatosis, cannot exclude hepatic fibrosis. 5. Additional findings include left main and 3 vessel coronary atherosclerosis, centrilobular emphysema, mild sigmoid diverticulosis and cholelithiasis. Electronically Signed   By: Ilona Sorrel M.D.   On: 01/20/2016 18:38   Dg Chest Portable 1 View  01/20/2016  CLINICAL DATA:  Central line placement EXAM: PORTABLE CHEST 1 VIEW COMPARISON:  6/23/ 17 FINDINGS: Cardiomediastinal silhouette is stable. Streaky interstitial prominence/ pneumonitis bilateral lower lobe and right upper lobe again noted. There is right IJ central line with tip in SVC. No pneumothorax. IMPRESSION: Right IJ central line in place.  No pneumothorax. Electronically Signed   By: Lahoma Crocker M.D.   On: 01/20/2016 20:51   Dg Chest Portable 1 View  01/20/2016  CLINICAL DATA:  Nausea and vomiting.  Pain. EXAM: PORTABLE CHEST 1 VIEW COMPARISON:  09/20/2015. FINDINGS: Mediastinum and hilar structures are normal. Mild bilateral upper lobe and bibasilar infiltrates. Small bilateral pleural effusions cannot be excluded. Heart size normal. Degenerative changes both shoulders. IMPRESSION: Mild bilateral upper lobe and bibasilar pulmonary infiltrates consistent with pneumonia. Electronically Signed   By: Marcello Moores  Register   On: 01/20/2016 17:00   Ir Nephrostomy Placement Right  01/21/2016  CLINICAL DATA:  Right ureteral obstruction from calculus causing hydronephrosis and acute sepsis. The patient requires emergent percutaneous nephrostomy tube to decompress the right kidney. EXAM: 1. ULTRASOUND GUIDANCE FOR PUNCTURE OF THE RIGHT RENAL COLLECTING SYSTEM. 2. RIGHT PERCUTANEOUS NEPHROSTOMY TUBE PLACEMENT.  COMPARISON:  CT of the abdomen and pelvis earlier today on 01/20/2016. ANESTHESIA/SEDATION: 1.0 mg IV Versed; 25 mcg IV Fentanyl. Total Moderate Sedation Time 15 minutes The patient's level of consciousness and physiologic status were continuously monitored during the procedure by Radiology nursing. CONTRAST:  20 ml Omnipaque 300 MEDICATIONS: No additional medications. Scheduled IV Fortaz and vancomycin were started just prior to the procedure. FLUOROSCOPY TIME:  1 minutes and 6 seconds. PROCEDURE: The procedure, risks, benefits, and alternatives were explained to the patient's son. Questions regarding the procedure were encouraged and answered. The patient's son understands and consents to the procedure. The right flank region was prepped with chlorhexidine in a sterile fashion, and a sterile drape was applied covering the operative field. A sterile gown and sterile gloves were used for the procedure. Local anesthesia was provided with 1% Lidocaine. Ultrasound was used to localize the right kidney. Under direct ultrasound guidance, a 21 gauge needle was advanced into the renal collecting system. Ultrasound image documentation was performed. Aspiration of urine sample was performed followed by contrast injection. A transitional dilator was advanced over a guidewire. Percutaneous tract dilatation was then performed over the guidewire. A 10 -French percutaneous nephrostomy tube was then advanced and formed in the collecting system. Catheter position was confirmed by fluoroscopy after contrast injection. The catheter was secured at the skin with a Prolene retention suture  and Stat-Lock device. A gravity bag was placed. COMPLICATIONS: None. FINDINGS: Ultrasound demonstrates moderate right hydronephrosis. After access of the collecting system, there was return of blood tinged urine. A sample was sent for culture analysis. The 10 French nephrostomy tube was formed in the renal pelvis. IMPRESSION: Right percutaneous  nephrostomy tube placement for acute decompression of the right kidney. A 10 French catheter was placed and formed in the renal pelvis. This tube was attached to a gravity drainage bag. A sample of urine was sent for culture analysis. Electronically Signed   By: Aletta Edouard M.D.   On: 01/21/2016 09:12    Assessment/Plan:   HD 2 for infected obstructing urolithiasis. On CC service, stable. Urine study positive for proteus. On broad spectrum abx coverage.  PCN tube access to right kidney functioning well. No new GU recs at present. Once pt recovered from current process, will perform ureteroscopic stone mgmt as an outpt   LOS: 1 day   Franchot Gallo M 01/21/2016, 11:33 AM

## 2016-01-21 NOTE — Progress Notes (Signed)
PHARMACY - PHYSICIAN COMMUNICATION CRITICAL VALUE ALERT - BLOOD CULTURE IDENTIFICATION (BCID)  Results for orders placed or performed during the hospital encounter of 01/20/16  Blood Culture ID Panel (Reflexed) (Collected: 01/20/2016  4:40 PM)  Result Value Ref Range   Enterococcus species NOT DETECTED NOT DETECTED   Vancomycin resistance NOT DETECTED NOT DETECTED   Listeria monocytogenes NOT DETECTED NOT DETECTED   Staphylococcus species NOT DETECTED NOT DETECTED   Staphylococcus aureus NOT DETECTED NOT DETECTED   Methicillin resistance NOT DETECTED NOT DETECTED   Streptococcus species NOT DETECTED NOT DETECTED   Streptococcus agalactiae NOT DETECTED NOT DETECTED   Streptococcus pneumoniae NOT DETECTED NOT DETECTED   Streptococcus pyogenes NOT DETECTED NOT DETECTED   Acinetobacter baumannii NOT DETECTED NOT DETECTED   Enterobacteriaceae species DETECTED (A) NOT DETECTED   Enterobacter cloacae complex NOT DETECTED NOT DETECTED   Escherichia coli NOT DETECTED NOT DETECTED   Klebsiella oxytoca NOT DETECTED NOT DETECTED   Klebsiella pneumoniae NOT DETECTED NOT DETECTED   Proteus species DETECTED (A) NOT DETECTED   Serratia marcescens NOT DETECTED NOT DETECTED   Carbapenem resistance NOT DETECTED NOT DETECTED   Haemophilus influenzae NOT DETECTED NOT DETECTED   Neisseria meningitidis NOT DETECTED NOT DETECTED   Pseudomonas aeruginosa NOT DETECTED NOT DETECTED   Candida albicans NOT DETECTED NOT DETECTED   Candida glabrata NOT DETECTED NOT DETECTED   Candida krusei NOT DETECTED NOT DETECTED   Candida parapsilosis NOT DETECTED NOT DETECTED   Candida tropicalis NOT DETECTED NOT DETECTED    Name of physician (or Provider) Contacted: Baltazar Apo, MD  Changes to prescribed antibiotics required: Continue Ceftazidime 1 g IV Q24h and Vancomycin 500 mg IV Q24H  Bennye Alm, PharmD Pharmacy Resident 01/21/2016  7:50 AM

## 2016-01-21 NOTE — Progress Notes (Signed)
[facility] Progress Note Patient Name: [name] DOB: Aug 01, 1940 MRN: [id number]   Date of Service  01/21/2016  HPI/Events of Note  Notified by bedside nurse of urinary incontinence.   eICU Interventions  Placing Foley catheter      Intervention Category Minor Interventions: Other:  Tera Partridge 01/21/2016, 6:05 AM

## 2016-01-21 NOTE — Progress Notes (Signed)
[facility] Progress Note Patient Name: [name] DOB: 02-27-1941 MRN: [id number]   Date of Service  01/21/2016  HPI/Events of Note  K+ = 3.4 and Creatinine = 0.99.  eICU Interventions  Will replete K+.     Intervention Category Intermediate Interventions: Electrolyte abnormality - evaluation and management  Sommer,Steven Eugene 01/21/2016, 5:56 PM

## 2016-01-21 NOTE — Consult Note (Signed)
WOC wound consult note Reason for Consult: Dressing orders needed for patient with A Cell placement.  Initial placement was 01/04/16, regular (every 3-4 days) contact with Dr. Marla Roe or PA, S. Rayburn since.  Last seen in office yesterday, 01/20/16 with new A cell placed.  No indication for dressing change until Tuesday, 01/24/16 Wound type:Surgical Pressure Ulcer POA: No Measurement:Not seen today Wound bed:Not seen today Drainage (amount, consistency, odor) Not seen today Periwound:Not seen today Dressing procedure/placement/frequency:Nursing provided with guidance via the orders for care of wound in that no change is needed until Tuesday, 01/24/16.  Orvan Seen, PA Plastics is contacted by son (via text) and she will come by to see patient either Monday or Tuesday. Guidance provided for Nursing to release knee immobilizer for periodic airflow not to exceed 30 minutes.  Note:  Patient's daught-in-law wishes it to be known that there is a urine stain on the posterior aspect of the cast measuring 3cm x 4cm.  The area is dry at this time and she is comforted by knowing that it is far from the surgical (and graft) site. [facility] nursing team will not follow, but will remain available to this patient, the nursing and medical teams.  Please re-consult if needed. Thanks, Maudie Flakes, MSN, RN, [place], Arther Abbott  Pager# [phone]

## 2016-01-21 NOTE — Progress Notes (Addendum)
[facility] Physician-Brief Progress Note Patient Name: [name] DOB: 15-Jun-1941 MRN: [id number]   Date of Service  01/21/2016  HPI/Events of Note  Review of STAT portable CXR reveals low lung volumes and pulmonary edema vs progression of infiltrates. BP = 112/68.  eICU Interventions  Will order: 1. 12 lead EKG now. 2. Cycle Troponin. 3. Lasix 40 mg IV now X 1.  4. Incentive spirometry Q 1 hour while awake.      Intervention Category Major Interventions: Respiratory failure - evaluation and management  [name] Cornelia Copa 01/21/2016, 9:16 PM

## 2016-01-21 NOTE — Progress Notes (Signed)
Referring Physician(s): Dr Merrie Roof  Supervising Physician: Aletta Edouard  Patient Status:  Inpatient  Chief Complaint:  Rt hydronephrosis  Subjective:  R PCN placed 6/23 pm Output 300 cc today Yellow urine Responds to me No complaints  Allergies: Review of patient's allergies indicates no known allergies.  Medications: Prior to Admission medications   Medication Sig Start Date End Date Taking? Authorizing Provider  amLODipine (NORVASC) 10 MG tablet TAKE 1 TABLET (10 MG TOTAL) BY MOUTH DAILY. Patient taking differently: Take 10 mg by mouth daily.  08/27/14  Yes Lelon Perla, MD  aspirin EC 81 MG tablet Take 81 mg by mouth daily.   Yes Historical Provider, MD  atorvastatin (LIPITOR) 40 MG tablet TAKE ONE TABLET BY MOUTH DAILY. Patient taking differently: Take 40 mg by mouth daily.  09/15/15  Yes Lelon Perla, MD  budesonide-formoterol (SYMBICORT) 160-4.5 MCG/ACT inhaler Inhale 2 puffs into the lungs 2 (two) times daily.   Yes Historical Provider, MD  colchicine 0.6 MG tablet Take 0.6 mg by mouth daily as needed (gout flare).  08/30/15  Yes Historical Provider, MD  diclofenac (VOLTAREN) 75 MG EC tablet Take 75 mg by mouth 2 (two) times daily.  12/18/11  Yes Historical Provider, MD  doxycycline (VIBRA-TABS) 100 MG tablet Take 1 tablet (100 mg total) by mouth 2 (two) times daily. Patient taking differently: Take 100 mg by mouth 2 (two) times daily. Continuous course until  05/25/16 11/24/15 05/25/16 Yes Thayer Headings, MD  Glucosamine-Chondroit-Vit C-Mn (GLUCOSAMINE-CHONDROITIN) CAPS Take 1 capsule by mouth daily.   Yes Historical Provider, MD  hydrochlorothiazide (MICROZIDE) 12.5 MG capsule TAKE 1 CAPSULE (12.5 MG TOTAL) BY MOUTH EVERY MORNING. Patient taking differently: Take 12.5 mg by mouth daily.  08/27/14  Yes Lelon Perla, MD  lisinopril (PRINIVIL,ZESTRIL) 40 MG tablet TAKE 1 TABLET (40 MG TOTAL) BY MOUTH DAILY. Patient taking differently: Take 40 mg by  mouth daily.  08/27/14  Yes Lelon Perla, MD  Multiple Vitamin (MULITIVITAMIN WITH MINERALS) TABS Take 1 tablet by mouth daily.   Yes Historical Provider, MD  nitrofurantoin, macrocrystal-monohydrate, (MACROBID) 100 MG capsule Take 100 mg by mouth 2 (two) times daily.   Yes Historical Provider, MD  polyethylene glycol (MIRALAX / GLYCOLAX) packet Take 17 g by mouth daily. Patient taking differently: Take 17 g by mouth daily as needed for moderate constipation.  11/17/15  Yes Shawn Montgomery Rayburn, PA-C  traMADol (ULTRAM) 50 MG tablet Take 50 mg by mouth every 12 (twelve) hours as needed for moderate pain.    Yes Historical Provider, MD  enoxaparin (LOVENOX) 30 MG/0.3ML injection Inject 0.3 mLs (30 mg total) into the skin daily. Patient taking differently: Inject 30 mg into the skin daily with supper.  11/17/15   Shawn Montgomery Rayburn, PA-C     Vital Signs: BP 123/56 mmHg  Pulse 81  Temp(Src) 97.1 F (36.2 C) (Oral)  Resp 14  Ht '5\' 2"'$  (1.575 m)  Wt 114 lb 6.7 oz (51.9 kg)  BMI 20.92 kg/m2  SpO2 96%  Physical Exam  Abdominal: Soft.  Skin: Skin is warm.  Site of R PCN clean and dry NT No bleeding Drain intact Output 300 cc yesterday Dark yellow  Nursing note and vitals reviewed.   Imaging: Ct Abdomen Pelvis W Contrast  01/20/2016  CLINICAL DATA:  Right lower quadrant abdominal pain for 5 days with nausea and vomiting. EXAM: CT ABDOMEN AND PELVIS WITH CONTRAST TECHNIQUE: Multidetector CT imaging of the abdomen and pelvis  was performed using the standard protocol following bolus administration of intravenous contrast. CONTRAST:  138m ISOVUE-300 IOPAMIDOL (ISOVUE-300) INJECTION 61% COMPARISON:  08/06/2005 CT abdomen/ pelvis. 02/14/2007 abdominal sonogram. FINDINGS: Lower chest: Centrilobular emphysema. Right middle lobe 3 mm pulmonary nodule (series 205/ image 6) is stable since 2007 and benign. Left main, left anterior descending, left circumflex and right coronary  atherosclerosis. Hepatobiliary: Heterogeneous liver parenchymal attenuation, with geographic areas of low attenuation in the left liver lobe. No definite liver surface irregularity. No liver mass. Mild diffuse periportal edema. Numerous subcentimeter calcified gallstones layering in the nondistended gallbladder, with no definite gallbladder wall thickening or pericholecystic fluid. No biliary ductal dilatation. Pancreas: Normal, with no mass or duct dilation. Spleen: Normal size. No mass. Adrenals/Urinary Tract: Mild diffuse irregular thickening of both adrenal glands without discrete adrenal mass, not definitely changed back to 2007, suggesting adrenal hyperplasia . Probable 2 mm stone at the right ureterovesical junction, noting limited visualization of the pelvic segment of the right ureter due to streak artifact from the right hip hardware. Separate 3 mm stone in the mid right pelvic ureter, with mild right hydroureteronephrosis. Additional nonobstructing 4 mm interpolar and 5 mm lower right renal stones. Exophytic simple 3.2 cm renal cyst in the anterior mid to upper right kidney. No left hydronephrosis. Bladder poorly visualized due to streak artifact, with no gross bladder abnormality. Stomach/Bowel: Grossly normal stomach. Normal caliber small bowel with no small bowel wall thickening. Normal appendix. Mild sigmoid diverticulosis, with no large bowel wall thickening or pericolonic fat stranding. Prominent rectal stool with rectal diameter 8.0 cm. No definite rectal wall thickening. Vascular/Lymphatic: Atherosclerotic nonaneurysmal abdominal aorta. Insert vein No pathologically enlarged lymph nodes in the abdomen or pelvis. Reproductive: Status post hysterectomy, with no gross abnormal findings at the vaginal cuff. No adnexal mass. Other: No pneumoperitoneum, ascites or focal fluid collection. Musculoskeletal: No aggressive appearing focal osseous lesions. Right total hip arthroplasty is partially visualized.  Moderate degenerative changes in the visualized thoracolumbar spine. Moderate to marked compression fracture of the T11 vertebral body of indeterminate chronicity, which appears new since the lateral 09/20/2015 chest radiograph. IMPRESSION: 1. Obstructing 2 mm right UVJ stone with mild right hydroureteronephrosis. Separate 3 mm stone in the mid right pelvic ureter. Additional nonobstructing right renal stones. 2. Suggestion of fecal impaction in the rectum. No definite rectal wall thickening. 3. Moderate to severe T11 vertebral compression fracture of indeterminate chronicity, which appears new since 09/20/2015. 4. Nonspecific liver parenchymal heterogeneity without discrete liver mass, favor hepatic steatosis, cannot exclude hepatic fibrosis. 5. Additional findings include left main and 3 vessel coronary atherosclerosis, centrilobular emphysema, mild sigmoid diverticulosis and cholelithiasis. Electronically Signed   By: JIlona SorrelM.D.   On: 01/20/2016 18:38   Dg Chest Portable 1 View  01/20/2016  CLINICAL DATA:  Central line placement EXAM: PORTABLE CHEST 1 VIEW COMPARISON:  6/23/ 17 FINDINGS: Cardiomediastinal silhouette is stable. Streaky interstitial prominence/ pneumonitis bilateral lower lobe and right upper lobe again noted. There is right IJ central line with tip in SVC. No pneumothorax. IMPRESSION: Right IJ central line in place.  No pneumothorax. Electronically Signed   By: LLahoma CrockerM.D.   On: 01/20/2016 20:51   Dg Chest Portable 1 View  01/20/2016  CLINICAL DATA:  Nausea and vomiting.  Pain. EXAM: PORTABLE CHEST 1 VIEW COMPARISON:  09/20/2015. FINDINGS: Mediastinum and hilar structures are normal. Mild bilateral upper lobe and bibasilar infiltrates. Small bilateral pleural effusions cannot be excluded. Heart size normal. Degenerative changes both shoulders. IMPRESSION:  Mild bilateral upper lobe and bibasilar pulmonary infiltrates consistent with pneumonia. Electronically Signed   By: Marcello Moores   Register   On: 01/20/2016 17:00   Ir Nephrostomy Placement Right  01/21/2016  CLINICAL DATA:  Right ureteral obstruction from calculus causing hydronephrosis and acute sepsis. The patient requires emergent percutaneous nephrostomy tube to decompress the right kidney. EXAM: 1. ULTRASOUND GUIDANCE FOR PUNCTURE OF THE RIGHT RENAL COLLECTING SYSTEM. 2. RIGHT PERCUTANEOUS NEPHROSTOMY TUBE PLACEMENT. COMPARISON:  CT of the abdomen and pelvis earlier today on 01/20/2016. ANESTHESIA/SEDATION: 1.0 mg IV Versed; 25 mcg IV Fentanyl. Total Moderate Sedation Time 15 minutes The patient's level of consciousness and physiologic status were continuously monitored during the procedure by Radiology nursing. CONTRAST:  20 ml Omnipaque 300 MEDICATIONS: No additional medications. Scheduled IV Fortaz and vancomycin were started just prior to the procedure. FLUOROSCOPY TIME:  1 minutes and 6 seconds. PROCEDURE: The procedure, risks, benefits, and alternatives were explained to the patient's son. Questions regarding the procedure were encouraged and answered. The patient's son understands and consents to the procedure. The right flank region was prepped with chlorhexidine in a sterile fashion, and a sterile drape was applied covering the operative field. A sterile gown and sterile gloves were used for the procedure. Local anesthesia was provided with 1% Lidocaine. Ultrasound was used to localize the right kidney. Under direct ultrasound guidance, a 21 gauge needle was advanced into the renal collecting system. Ultrasound image documentation was performed. Aspiration of urine sample was performed followed by contrast injection. A transitional dilator was advanced over a guidewire. Percutaneous tract dilatation was then performed over the guidewire. A 10 -French percutaneous nephrostomy tube was then advanced and formed in the collecting system. Catheter position was confirmed by fluoroscopy after contrast injection. The catheter was  secured at the skin with a Prolene retention suture and Stat-Lock device. A gravity bag was placed. COMPLICATIONS: None. FINDINGS: Ultrasound demonstrates moderate right hydronephrosis. After access of the collecting system, there was return of blood tinged urine. A sample was sent for culture analysis. The 10 French nephrostomy tube was formed in the renal pelvis. IMPRESSION: Right percutaneous nephrostomy tube placement for acute decompression of the right kidney. A 10 French catheter was placed and formed in the renal pelvis. This tube was attached to a gravity drainage bag. A sample of urine was sent for culture analysis. Electronically Signed   By: Aletta Edouard M.D.   On: 01/21/2016 09:12    Labs:  CBC:  Recent Labs  12/08/15 1141 01/04/16 1204 01/20/16 1538 01/21/16 0130  WBC 7.3 9.1 6.9 46.1*  HGB 9.6* 10.0* 11.5* 9.2*  HCT 30.3* 30.2* 37.7 29.6*  PLT 416* PLATELET CLUMPS NOTED ON SMEAR, COUNT APPEARS ADEQUATE 389 200    COAGS:  Recent Labs  09/20/15 1558 01/04/16 1204  INR 1.11 1.15  APTT 37  --     BMP:  Recent Labs  01/04/16 1204 01/20/16 1538 01/21/16 0130 01/21/16 0209 01/21/16 0512  NA 138 136  --  133* 132*  K 4.6 4.7  --  3.5 3.6  CL 106 104  --  105 105  CO2 21* 16*  --  17* 18*  GLUCOSE 107* 110*  --  161* 169*  BUN 28* 36*  --  35* 34*  CALCIUM 10.0 10.4*  --  8.5* 8.4*  CREATININE 0.90 1.52* 1.45* 1.43* 1.36*  GFRNONAA >60 33* 35* 35* 37*  GFRAA >60 38* 40* 41* 43*    LIVER FUNCTION TESTS:  Recent Labs  09/20/15 1558 01/20/16 1538  BILITOT 0.3 1.0  AST 22 115*  ALT 18 68*  ALKPHOS 115 255*  PROT 7.4 6.9  ALBUMIN 2.8* 2.6*    Assessment and Plan:  Rt PCN intact Working well Bun/Cr sl lower Wbc 46 today!!!---rechecking now afeb Will follow  Electronically Signed: Augusta Hilbert A 01/21/2016, 11:56 AM   I spent a total of 15 Minutes at the the patient's bedside AND on the patient's hospital floor or unit, greater than 50%  of which was counseling/coordinating care for Rt PCN

## 2016-01-21 NOTE — Progress Notes (Signed)
ANTICOAGULATION CONSULT NOTE - Initial Consult  Pharmacy Consult for heparin Indication: chest pain/ACS  No Known Allergies  Patient Measurements: Height: '5\' 2"'$  (157.5 cm) Weight: 114 lb 6.7 oz (51.9 kg) IBW/kg (Calculated) : 50.1 Heparin Dosing Weight: 51.9 kg  Vital Signs: Temp: 97.3 F (36.3 C) (06/24 1933) Temp Source: Oral (06/24 1933) BP: 124/64 mmHg (06/24 2200) Pulse Rate: 91 (06/24 2200)  Labs:  Recent Labs  01/20/16 1538 01/21/16 0130  01/21/16 0512 01/21/16 1159 01/21/16 1445 01/21/16 2100  HGB 11.5* 9.2*  --   --  9.3*  --   --   HCT 37.7 29.6*  --   --  29.8*  --   --   PLT 389 200  --   --  179  --   --   CREATININE 1.52* 1.45*  < > 1.36*  --  0.99 0.94  TROPONINI  --  0.07*  --   --   --   --  0.52*  < > = values in this interval not displayed.  Estimated Creatinine Clearance: 41.5 mL/min (by C-G formula based on Cr of 0.94).   Medical History: Past Medical History  Diagnosis Date  . CAD (coronary artery disease)   . HTN (hypertension)   . Hyperlipidemia   . Cerebrovascular disease   . Arthritis   . PVD (peripheral vascular disease) (HCC)     dvt in leg  . MRSA (methicillin resistant staph aureus) culture positive   . Wears glasses   . Heart murmur   . History of bronchitis   . Memory impairment   . Complication of anesthesia     Three days after procedure pt statrted to suffer with memoy loss that took about 4-5 days to come back according to [name]."    Medications:  Prescriptions prior to admission  Medication Sig Dispense Refill Last Dose  . amLODipine (NORVASC) 10 MG tablet TAKE 1 TABLET (10 MG TOTAL) BY MOUTH DAILY. (Patient taking differently: Take 10 mg by mouth daily. ) 90 tablet 3 01/20/2016 at Unknown time  . aspirin EC 81 MG tablet Take 81 mg by mouth daily.   01/20/2016 at Unknown time  . atorvastatin (LIPITOR) 40 MG tablet TAKE ONE TABLET BY MOUTH DAILY. (Patient taking differently: Take 40 mg by mouth daily. ) 90 tablet  0 01/20/2016 at Unknown time  . budesonide-formoterol (SYMBICORT) 160-4.5 MCG/ACT inhaler Inhale 2 puffs into the lungs 2 (two) times daily.   01/20/2016 at Unknown time  . colchicine 0.6 MG tablet Take 0.6 mg by mouth daily as needed (gout flare).    01/20/2016 at Unknown time  . diclofenac (VOLTAREN) 75 MG EC tablet Take 75 mg by mouth 2 (two) times daily.    01/20/2016 at Unknown time  . doxycycline (VIBRA-TABS) 100 MG tablet Take 1 tablet (100 mg total) by mouth 2 (two) times daily. (Patient taking differently: Take 100 mg by mouth 2 (two) times daily. Continuous course until  05/25/16) 60 tablet 5 01/20/2016 at Unknown time  . Glucosamine-Chondroit-Vit C-Mn (GLUCOSAMINE-CHONDROITIN) CAPS Take 1 capsule by mouth daily.   01/20/2016 at Unknown time  . hydrochlorothiazide (MICROZIDE) 12.5 MG capsule TAKE 1 CAPSULE (12.5 MG TOTAL) BY MOUTH EVERY MORNING. (Patient taking differently: Take 12.5 mg by mouth daily. ) 90 capsule 3 01/20/2016 at Unknown time  . lisinopril (PRINIVIL,ZESTRIL) 40 MG tablet TAKE 1 TABLET (40 MG TOTAL) BY MOUTH DAILY. (Patient taking differently: Take 40 mg by mouth daily. ) 90 tablet 3 01/20/2016  at Unknown time  . Multiple Vitamin (MULITIVITAMIN WITH MINERALS) TABS Take 1 tablet by mouth daily.   01/20/2016 at Unknown time  . nitrofurantoin, macrocrystal-monohydrate, (MACROBID) 100 MG capsule Take 100 mg by mouth 2 (two) times daily.   01/20/2016 at Unknown time  . polyethylene glycol (MIRALAX / GLYCOLAX) packet Take 17 g by mouth daily. (Patient taking differently: Take 17 g by mouth daily as needed for moderate constipation. ) 14 each 0 Past Month at Unknown time  . traMADol (ULTRAM) 50 MG tablet Take 50 mg by mouth every 12 (twelve) hours as needed for moderate pain.    01/20/2016 at am  . enoxaparin (LOVENOX) 30 MG/0.3ML injection Inject 0.3 mLs (30 mg total) into the skin daily. (Patient taking differently: Inject 30 mg into the skin daily with supper. ) 0 Syringe  01/01/2016     Assessment: 75 yo F in ICU with sepsis.  S/p perc drain by IR 6/23 pm for hydronephrosis.  Pharmacy consulted to dose heparin for ACS/STEMI in pt w/ troponin of 0.52.  She just received sq heparin 5000 units at 2138.  Wt 51.9 kg.  Hg 9.3, pltc 179.   Goal of Therapy:  Heparin level 0.3-0.7 units/ml Monitor platelets by anticoagulation protocol: Yes   Plan: dc sq heparin  Give 2000 units bolus x 1 Start heparin infusion at 600 units/hr Check anti-Xa level in 8 hours and daily while on heparin Continue to monitor H&H and platelets  Eudelia Bunch, Pharm.D. [phone] 01/21/2016 11:25 PM

## 2016-01-21 NOTE — Progress Notes (Signed)
CRITICAL VALUE ALERT  Critical value received:  Troponin 0.52  Date of notification:  01/21/2016  Time of notification:  2231  Critical value read back:Yes.    Nurse who received alert:  Philamena Kramar Thompson  MD notified (1st page):  Summer  Time of first page:  2231  MD notified (2nd page):  Time of second page:  Responding MD:  Summer  Time MD responded:  2240

## 2016-01-21 NOTE — Progress Notes (Signed)
[place] Progress Note Patient Name: KATHEY SIMER DOB: [birth year] MRN: [id number]   Date of Service  01/21/2016  HPI/Events of Note  Patient more confused and lethargic. Increased O2 requirement and decreased sats. Sat = 89%. RR = 21.   eICU Interventions  Will order: 1. ABG STAT. 2. Portable CXR STAT.     Intervention Category Intermediate Interventions: Respiratory distress - evaluation and management  Yue Glasheen Eugene 01/21/2016, 8:42 PM

## 2016-01-21 NOTE — Progress Notes (Signed)
PULMONARY / CRITICAL CARE MEDICINE   Name: [name] MRN: [id number] DOB: 04/17/1941    ADMISSION DATE:  01/20/2016 CONSULTATION DATE:  01/20/16  REFERRING MD:  Dr Regenia Skeeter   CHIEF COMPLAINT: sepsis  HISTORY OF PRESENT ILLNESS:   [name] is a 75 y.o. female who presented to the emergency room here at [facility] earlier today with a two-week history of crampy abdominal pain, frequency, urgency, dysuria.  Pt in c/o n/v onset today with reported x 3 vomiting episodes today, pt c/o RLQ abd pain onset yesterday, pt reports urinary frequency onset last night, pt denies diarrhea, denies CP & SOB, A&O x4.  It was felt that the patient may have had a urinary tract infection. Earlier 6/23, she went to the plastic surgeon who recently performed debridement of a left knee wound. She is status post knee replacement in March. Knee was cleared by plastics.  In the emergency room, she had sepsis dx, low BP and fever 103 f. CT of the abdomen revealed mild to moderate right hydroureteronephrosis, 2 stones in the distal ureter-both small, but one at the UVJ and one slightly proximal. She had lactic acidosis and code sepsis activated, PCCM called for admit.  To IR for perc drain placement.    SUBJECTIVE: RN reports pt asking for food, anxious to eat.  Remains on vasopressors - levophed at 20 mcg, vasopressin, stress steroids.  Afebrile.    VITAL SIGNS: BP 111/60 mmHg  Pulse 79  Temp(Src) 97.1 F (36.2 C) (Oral)  Resp 11  Ht '5\' 2"'$  (1.575 m)  Wt 114 lb 6.7 oz (51.9 kg)  BMI 20.92 kg/m2  SpO2 94%  HEMODYNAMICS: CVP:  [4 mmHg-9 mmHg] 4 mmHg  VENTILATOR SETTINGS:    INTAKE / OUTPUT: I/O last 3 completed shifts: In: 2086.7 [I.V.:2086.7] Out: 345 [Urine:345]  PHYSICAL EXAMINATION: General: awake, no distress Neuro:  Awake, alert, oriented, speech clear, MAE HEENT:  MM pink/dry, no jvd Cardiovascular:  s1s2 RRR, no m/rg Lungs:  Non-labored, lungs bilaterally with faint basilar  crackles Abdomen:  Soft, BS hypo, tender llq, no r/g Musculoskeletal:  Knee immobilzed, dry Skin:  Dry, warm   LABS:  BMET  Recent Labs Lab 01/20/16 1538 01/21/16 0130 01/21/16 0209 01/21/16 0512  NA 136  --  133* 132*  K 4.7  --  3.5 3.6  CL 104  --  105 105  CO2 16*  --  17* 18*  BUN 36*  --  35* 34*  CREATININE 1.52* 1.45* 1.43* 1.36*  GLUCOSE 110*  --  161* 169*    Electrolytes  Recent Labs Lab 01/20/16 1538 01/21/16 0209 01/21/16 0512  CALCIUM 10.4* 8.5* 8.4*  MG  --  1.9  --   PHOS  --  4.2  --     CBC  Recent Labs Lab 01/20/16 1538 01/21/16 0130  WBC 6.9 46.1*  HGB 11.5* 9.2*  HCT 37.7 29.6*  PLT 389 200    Coag's No results for input(s): APTT, INR in the last 168 hours.  Sepsis Markers  Recent Labs Lab 01/20/16 1939 01/21/16 0130 01/21/16 0512 01/21/16 0843  LATICACIDVEN 5.90*  --  3.1* 3.0*  PROCALCITON  --  >200.00 >200.00  --     ABG No results for input(s): PHART, PCO2ART, PO2ART in the last 168 hours.  Liver Enzymes  Recent Labs Lab 01/20/16 1538  AST 115*  ALT 68*  ALKPHOS 255*  BILITOT 1.0  ALBUMIN 2.6*    Cardiac Enzymes  Recent  Labs Lab 01/21/16 0130  TROPONINI 0.07*    Glucose  Recent Labs Lab 01/21/16 0139 01/21/16 0337 01/21/16 0832  GLUCAP 168* 186* 140*    Imaging Ct Abdomen Pelvis W Contrast  01/20/2016  CLINICAL DATA:  Right lower quadrant abdominal pain for 5 days with nausea and vomiting. EXAM: CT ABDOMEN AND PELVIS WITH CONTRAST TECHNIQUE: Multidetector CT imaging of the abdomen and pelvis was performed using the standard protocol following bolus administration of intravenous contrast. CONTRAST:  175m ISOVUE-300 IOPAMIDOL (ISOVUE-300) INJECTION 61% COMPARISON:  08/06/2005 CT abdomen/ pelvis. 02/14/2007 abdominal sonogram. FINDINGS: Lower chest: Centrilobular emphysema. Right middle lobe 3 mm pulmonary nodule (series 205/ image 6) is stable since 2007 and benign. Left main, left anterior  descending, left circumflex and right coronary atherosclerosis. Hepatobiliary: Heterogeneous liver parenchymal attenuation, with geographic areas of low attenuation in the left liver lobe. No definite liver surface irregularity. No liver mass. Mild diffuse periportal edema. Numerous subcentimeter calcified gallstones layering in the nondistended gallbladder, with no definite gallbladder wall thickening or pericholecystic fluid. No biliary ductal dilatation. Pancreas: Normal, with no mass or duct dilation. Spleen: Normal size. No mass. Adrenals/Urinary Tract: Mild diffuse irregular thickening of both adrenal glands without discrete adrenal mass, not definitely changed back to 2007, suggesting adrenal hyperplasia . Probable 2 mm stone at the right ureterovesical junction, noting limited visualization of the pelvic segment of the right ureter due to streak artifact from the right hip hardware. Separate 3 mm stone in the mid right pelvic ureter, with mild right hydroureteronephrosis. Additional nonobstructing 4 mm interpolar and 5 mm lower right renal stones. Exophytic simple 3.2 cm renal cyst in the anterior mid to upper right kidney. No left hydronephrosis. Bladder poorly visualized due to streak artifact, with no gross bladder abnormality. Stomach/Bowel: Grossly normal stomach. Normal caliber small bowel with no small bowel wall thickening. Normal appendix. Mild sigmoid diverticulosis, with no large bowel wall thickening or pericolonic fat stranding. Prominent rectal stool with rectal diameter 8.0 cm. No definite rectal wall thickening. Vascular/Lymphatic: Atherosclerotic nonaneurysmal abdominal aorta. Insert vein No pathologically enlarged lymph nodes in the abdomen or pelvis. Reproductive: Status post hysterectomy, with no gross abnormal findings at the vaginal cuff. No adnexal mass. Other: No pneumoperitoneum, ascites or focal fluid collection. Musculoskeletal: No aggressive appearing focal osseous lesions. Right  total hip arthroplasty is partially visualized. Moderate degenerative changes in the visualized thoracolumbar spine. Moderate to marked compression fracture of the T11 vertebral body of indeterminate chronicity, which appears new since the lateral 09/20/2015 chest radiograph. IMPRESSION: 1. Obstructing 2 mm right UVJ stone with mild right hydroureteronephrosis. Separate 3 mm stone in the mid right pelvic ureter. Additional nonobstructing right renal stones. 2. Suggestion of fecal impaction in the rectum. No definite rectal wall thickening. 3. Moderate to severe T11 vertebral compression fracture of indeterminate chronicity, which appears new since 09/20/2015. 4. Nonspecific liver parenchymal heterogeneity without discrete liver mass, favor hepatic steatosis, cannot exclude hepatic fibrosis. 5. Additional findings include left main and 3 vessel coronary atherosclerosis, centrilobular emphysema, mild sigmoid diverticulosis and cholelithiasis. Electronically Signed   By: JIlona SorrelM.D.   On: 01/20/2016 18:38   Dg Chest Portable 1 View  01/20/2016  CLINICAL DATA:  Central line placement EXAM: PORTABLE CHEST 1 VIEW COMPARISON:  6/23/ 17 FINDINGS: Cardiomediastinal silhouette is stable. Streaky interstitial prominence/ pneumonitis bilateral lower lobe and right upper lobe again noted. There is right IJ central line with tip in SVC. No pneumothorax. IMPRESSION: Right IJ central line in place.  No pneumothorax.  Electronically Signed   By: Lahoma Crocker M.D.   On: 01/20/2016 20:51   Dg Chest Portable 1 View  01/20/2016  CLINICAL DATA:  Nausea and vomiting.  Pain. EXAM: PORTABLE CHEST 1 VIEW COMPARISON:  09/20/2015. FINDINGS: Mediastinum and hilar structures are normal. Mild bilateral upper lobe and bibasilar infiltrates. Small bilateral pleural effusions cannot be excluded. Heart size normal. Degenerative changes both shoulders. IMPRESSION: Mild bilateral upper lobe and bibasilar pulmonary infiltrates consistent with  pneumonia. Electronically Signed   By: Marcello Moores  Register   On: 01/20/2016 17:00   Ir Nephrostomy Placement Right  01/21/2016  CLINICAL DATA:  Right ureteral obstruction from calculus causing hydronephrosis and acute sepsis. The patient requires emergent percutaneous nephrostomy tube to decompress the right kidney. EXAM: 1. ULTRASOUND GUIDANCE FOR PUNCTURE OF THE RIGHT RENAL COLLECTING SYSTEM. 2. RIGHT PERCUTANEOUS NEPHROSTOMY TUBE PLACEMENT. COMPARISON:  CT of the abdomen and pelvis earlier today on 01/20/2016. ANESTHESIA/SEDATION: 1.0 mg IV Versed; 25 mcg IV Fentanyl. Total Moderate Sedation Time 15 minutes The patient's level of consciousness and physiologic status were continuously monitored during the procedure by Radiology nursing. CONTRAST:  20 ml Omnipaque 300 MEDICATIONS: No additional medications. Scheduled IV Fortaz and vancomycin were started just prior to the procedure. FLUOROSCOPY TIME:  1 minutes and 6 seconds. PROCEDURE: The procedure, risks, benefits, and alternatives were explained to the patient's son. Questions regarding the procedure were encouraged and answered. The patient's son understands and consents to the procedure. The right flank region was prepped with chlorhexidine in a sterile fashion, and a sterile drape was applied covering the operative field. A sterile gown and sterile gloves were used for the procedure. Local anesthesia was provided with 1% Lidocaine. Ultrasound was used to localize the right kidney. Under direct ultrasound guidance, a 21 gauge needle was advanced into the renal collecting system. Ultrasound image documentation was performed. Aspiration of urine sample was performed followed by contrast injection. A transitional dilator was advanced over a guidewire. Percutaneous tract dilatation was then performed over the guidewire. A 10 -French percutaneous nephrostomy tube was then advanced and formed in the collecting system. Catheter position was confirmed by fluoroscopy  after contrast injection. The catheter was secured at the skin with a Prolene retention suture and Stat-Lock device. A gravity bag was placed. COMPLICATIONS: None. FINDINGS: Ultrasound demonstrates moderate right hydronephrosis. After access of the collecting system, there was return of blood tinged urine. A sample was sent for culture analysis. The 10 French nephrostomy tube was formed in the renal pelvis. IMPRESSION: Right percutaneous nephrostomy tube placement for acute decompression of the right kidney. A 10 French catheter was placed and formed in the renal pelvis. This tube was attached to a gravity drainage bag. A sample of urine was sent for culture analysis. Electronically Signed   By: Aletta Edouard M.D.   On: 01/21/2016 09:12     STUDIES:  CT abdo 6/23 >> Obstructing 2 mm right UVJ stone with mild right hydroureteronephrosis. Separate 3 mm stone in the mid right pelvic ureter. Additional nonobstructing right renal stones.  Suggestion of fecal impaction in the rectum. No definite rectal wall thickening.  Moderate to severe T11 vertebral compression fracture of indeterminate chronicity  CULTURES: 6/23 BC >> 6/23 BC Reflex >> enterobacter / proteus species in blood  6/23 urine >>  ANTIBIOTICS: ceftaz 6/23 >> vanc 6/23 >>   SIGNIFICANT EVENTS: 6/23 - septic shock, to IR for perc nephrostomy  LINES/TUBES: 6/23 rt IJ >> R nephrostomy 6/23 >>    ASSESSMENT /  PLAN:  PULMONARY A: At Risk Atelectasis  P:   O2 Trend CXR with volume resuscitation Pulmonary hygiene  CARDIOVASCULAR A:  Septic shock secondary to obstructive uropathy - negative troponin P:  Monitor CVP Stress dose steroids  Levophed for MAP >65 Vasopressin gtt ICU monitoring of hemodynamics    RENAL A:   ARF, obstructing uropathy s/p percutaneous nephrostomy 6/23 Hyponatremia P:   Appreciate IR assistance At risk post obstructive diuresis,BMP q8h x6  Monitor for NonAG  developement  GASTROINTESTINAL A:   Nausea / Vomiting P:   PPI Begin clear liquid diet and advance as tolerated   HEMATOLOGIC A:   DVT prevention post knee surgery P:  SQ Heparin for DVT prophylaxis  Trend CBC  INFECTIOUS A:   Obstructing uropathy, UTI, septic shock, h/o MRSA, nosocomial exposure Leukocytosis -  Recent knee debridement - on doxycycline PTA P:   Vanc, ceftaz Cultures as above Trend PCT, lactic acid   ENDOCRINE A:   At risk hyperglycemia Hx Gout P:   CBG Q4 Hold home colchicine  NEUROLOGIC A:   Mild septic encephalopathy  P:   RASS goal: 0 PRN tylenol, fentanyl for pain    FAMILY  - Updates:  Patient updated on plan of care 6/24.    - Inter-disciplinary family meet or Palliative Care meeting due by:    Noe Gens, NP-C [facility] Pgr: [phone] or if no answer [phone] 01/21/2016, 11:43 AM '  Attending Note:  I have examined patient, reviewed labs, studies and notes. I have discussed the case with B Ollis, and I agree with the data and plans as amended above. 75 yo woman with septic shock and proteus bacteremia due to R ureteral stones and obstruction with pyelonephritis. She is s/p percutaneous drainage on 6/23. On my eval she wakes to voice, interacts. She remains on norepi 20, vasopressin. Globally weak but non-focal. R knee is wrapped. We will continue her current abx for now, likely stop vanco in am 6/25. Wean pressors as able. Consider repeat bolus IVF. Appreciate IR and Urology's assistance. Independent critical care time is 35 minutes.   Baltazar Apo, MD, PhD 01/21/2016, 12:48 PM [facility] [phone] or if no answer [phone]

## 2016-01-21 NOTE — Progress Notes (Signed)
Called MD regarding low O2  In the low 80s on 10L [place]. NRB placed and making patient more agitated and restless. More confused and delirious. CXR, ABG, EKG, troponin, and lasix ordered. Will continue to monitor. Ruben Gottron, [place] 01/21/2016 2112

## 2016-01-21 NOTE — Progress Notes (Signed)
eLink Physician-Brief Progress Note Patient Name: [name] DOB: [birth year] MRN: [id number]   Date of Service  01/21/2016  HPI/Events of Note  New patient admitted to ICU with septic shock. Camera check shows patient resting comfortably. Patient complaining of pain in her leg. Status post percutaneous nephrostomy tube. Currently requiring high-dose Levophed infusion.   eICU Interventions  1. Fentanyl & Tylenol when necessary pain 2. Vasopressin for blood pressure augmentation  3. Continue trending electrolytes      Intervention Category Major Interventions: Shock - evaluation and management Evaluation Type: New Patient Evaluation  Tera Partridge 01/21/2016, 1:20 AM

## 2016-01-22 ENCOUNTER — Inpatient Hospital Stay (HOSPITAL_COMMUNITY): Payer: Medicare Other

## 2016-01-22 ENCOUNTER — Other Ambulatory Visit (HOSPITAL_COMMUNITY): Payer: Medicare Other

## 2016-01-22 DIAGNOSIS — I214 Non-ST elevation (NSTEMI) myocardial infarction: Secondary | ICD-10-CM

## 2016-01-22 DIAGNOSIS — I213 ST elevation (STEMI) myocardial infarction of unspecified site: Secondary | ICD-10-CM

## 2016-01-22 LAB — CBC
HCT: 26.5 % — ABNORMAL LOW (ref 36.0–46.0)
Hemoglobin: 8.3 g/dL — ABNORMAL LOW (ref 12.0–15.0)
MCH: 25.9 pg — AB (ref 26.0–34.0)
MCHC: 31.3 g/dL (ref 30.0–36.0)
MCV: 82.8 fL (ref 78.0–100.0)
PLATELETS: 134 10*3/uL — AB (ref 150–400)
RBC: 3.2 MIL/uL — AB (ref 3.87–5.11)
RDW: 16 % — ABNORMAL HIGH (ref 11.5–15.5)
WBC: 50.7 10*3/uL (ref 4.0–10.5)

## 2016-01-22 LAB — ECHOCARDIOGRAM COMPLETE
CHL CUP MV DEC (S): 190
CHL CUP STROKE VOLUME: 31 mL
E decel time: 190 msec
EERAT: 11.95
FS: 23 % — AB (ref 28–44)
HEIGHTINCHES: 62 in
IV/PV OW: 0.67
LA vol A4C: 85.1 ml
LA vol index: 57.6 mL/m2
LADIAMINDEX: 1.9 cm/m2
LASIZE: 29 mm
LAVOL: 88.1 mL
LEFT ATRIUM END SYS DIAM: 29 mm
LV E/e' medial: 11.95
LV PW d: 9.05 mm — AB (ref 0.6–1.1)
LV TDI E'LATERAL: 6.96
LV dias vol: 91 mL (ref 46–106)
LV sys vol index: 39 mL/m2
LV sys vol: 59 mL — AB (ref 14–42)
LVDIAVOLIN: 59 mL/m2
LVEEAVG: 11.95
LVELAT: 6.96 cm/s
LVOT area: 2.54 cm2
LVOTD: 18 mm
MV pk E vel: 83.2 m/s
MVPG: 3 mmHg
MVPKAVEL: 68.3 m/s
P 1/2 time: 384 ms
PISA EROA: 0.05 cm2
Simpson's disk: 34
TAPSE: 15.9 mm
TDI e' medial: 5.11
VTI: 161 cm
Weight: 1894.19 oz

## 2016-01-22 LAB — URINE CULTURE

## 2016-01-22 LAB — HEPARIN LEVEL (UNFRACTIONATED)
HEPARIN UNFRACTIONATED: 0.21 [IU]/mL — AB (ref 0.30–0.70)
HEPARIN UNFRACTIONATED: 0.35 [IU]/mL (ref 0.30–0.70)

## 2016-01-22 LAB — PROTIME-INR
INR: 2.03 — ABNORMAL HIGH (ref 0.00–1.49)
PROTHROMBIN TIME: 22.8 s — AB (ref 11.6–15.2)

## 2016-01-22 LAB — BASIC METABOLIC PANEL
Anion gap: 9 (ref 5–15)
Anion gap: 8 (ref 5–15)
BUN: 26 mg/dL — ABNORMAL HIGH (ref 6–20)
BUN: 30 mg/dL — ABNORMAL HIGH (ref 6–20)
CALCIUM: 8 mg/dL — AB (ref 8.9–10.3)
CALCIUM: 7.7 mg/dL — AB (ref 8.9–10.3)
CHLORIDE: 105 mmol/L (ref 101–111)
CO2: 22 mmol/L (ref 22–32)
CO2: 24 mmol/L (ref 22–32)
CREATININE: 1.1 mg/dL — AB (ref 0.44–1.00)
CREATININE: 0.97 mg/dL (ref 0.44–1.00)
Chloride: 103 mmol/L (ref 101–111)
GFR calc Af Amer: 56 mL/min — ABNORMAL LOW (ref >60)
GFR calc non Af Amer: 48 mL/min — ABNORMAL LOW (ref >60)
GFR, EST NON AFRICAN AMERICAN: 56 mL/min — AB (ref >60)
GLUCOSE: 161 mg/dL — AB (ref 65–99)
Glucose, Bld: 92 mg/dL (ref 65–99)
Potassium: 3.6 mmol/L (ref 3.5–5.1)
Potassium: 3.5 mmol/L (ref 3.5–5.1)
SODIUM: 137 mmol/L (ref 135–145)
Sodium: 134 mmol/L — ABNORMAL LOW (ref 135–145)

## 2016-01-22 LAB — CK TOTAL AND CKMB (NOT AT ARMC)
CK TOTAL: 352 U/L — AB (ref 38–234)
CK, MB: 12.1 ng/mL — ABNORMAL HIGH (ref 0.5–5.0)
Relative Index: 3.4 — ABNORMAL HIGH (ref 0.0–2.5)

## 2016-01-22 LAB — GLUCOSE, CAPILLARY
GLUCOSE-CAPILLARY: 129 mg/dL — AB (ref 65–99)
GLUCOSE-CAPILLARY: 72 mg/dL (ref 65–99)
Glucose-Capillary: 148 mg/dL — ABNORMAL HIGH (ref 65–99)
Glucose-Capillary: 150 mg/dL — ABNORMAL HIGH (ref 65–99)
Glucose-Capillary: 116 mg/dL — ABNORMAL HIGH (ref 65–99)
Glucose-Capillary: 73 mg/dL (ref 65–99)

## 2016-01-22 LAB — HEMOGLOBIN AND HEMATOCRIT, BLOOD
HCT: 26.8 % — ABNORMAL LOW (ref 36.0–46.0)
Hemoglobin: 8.5 g/dL — ABNORMAL LOW (ref 12.0–15.0)

## 2016-01-22 LAB — TROPONIN I
TROPONIN I: 1.36 ng/mL — AB (ref <0.031)
TROPONIN I: 1.52 ng/mL — AB (ref <0.031)
Troponin I: 0.79 ng/mL (ref <0.031)
Troponin I: 1.53 ng/mL (ref <0.031)

## 2016-01-22 LAB — PROCALCITONIN

## 2016-01-22 LAB — APTT: aPTT: 47 seconds — ABNORMAL HIGH (ref 24–37)

## 2016-01-22 MED ORDER — CETYLPYRIDINIUM CHLORIDE 0.05 % MT LIQD
7.0000 mL | Freq: Two times a day (BID) | OROMUCOSAL | Status: DC
  Administered 2016-01-22 – 2016-02-01 (×15): 7 mL via OROMUCOSAL

## 2016-01-22 MED ORDER — FUROSEMIDE 10 MG/ML IJ SOLN
20.0000 mg | Freq: Once | INTRAMUSCULAR | Status: AC
  Administered 2016-01-22: 20 mg via INTRAVENOUS
  Filled 2016-01-22: qty 2

## 2016-01-22 MED ORDER — SODIUM CHLORIDE 0.45 % IV SOLN
INTRAVENOUS | Status: DC
  Administered 2016-01-22 – 2016-01-23 (×2): via INTRAVENOUS

## 2016-01-22 MED ORDER — DEXMEDETOMIDINE HCL IN NACL 200 MCG/50ML IV SOLN
0.4000 ug/kg/h | INTRAVENOUS | Status: DC
  Administered 2016-01-22: 0.6 ug/kg/h via INTRAVENOUS
  Administered 2016-01-22: 0.4 ug/kg/h via INTRAVENOUS
  Filled 2016-01-22 (×2): qty 50

## 2016-01-22 MED ORDER — CHLORHEXIDINE GLUCONATE 0.12 % MT SOLN
15.0000 mL | Freq: Two times a day (BID) | OROMUCOSAL | Status: DC
  Administered 2016-01-22 – 2016-02-01 (×21): 15 mL via OROMUCOSAL
  Filled 2016-01-22 (×18): qty 15

## 2016-01-22 NOTE — Progress Notes (Signed)
ANTICOAGULATION CONSULT NOTE  Pharmacy Consult for heparin Indication: chest pain/ACS  No Known Allergies  Patient Measurements: Height: '5\' 2"'$  (157.5 cm) Weight: 118 lb 6.2 oz (53.7 kg) IBW/kg (Calculated) : 50.1 Heparin Dosing Weight: 51.9 kg  Vital Signs: Temp: 98.5 F (36.9 C) (06/25 1633) Temp Source: Oral (06/25 1633) BP: 103/58 mmHg (06/25 1815) Pulse Rate: 94 (06/25 1815)  Labs:  Recent Labs  01/21/16 0130  01/21/16 1159  01/21/16 2100 01/22/16 0020 01/22/16 0400 01/22/16 0404 01/22/16 0832 01/22/16 1230 01/22/16 1500 01/22/16 1657  HGB 9.2*  --  9.3*  --   --   --   --  8.3*  --   --   --  8.5*  HCT 29.6*  --  29.8*  --   --   --   --  26.5*  --   --   --  26.8*  PLT 200  --  179  --   --   --   --  134*  --   --   --   --   APTT  --   --   --   --   --  47*  --   --   --   --   --   --   LABPROT  --   --   --   --   --  22.8*  --   --   --   --   --   --   INR  --   --   --   --   --  2.03*  --   --   --   --   --   --   HEPARINUNFRC  --   --   --   --   --   --   --   --  0.21*  --   --  0.35  CREATININE 1.45*  < >  --   < > 0.94  --  1.10*  --   --   --  0.97  --   CKTOTAL  --   --   --   --   --  352*  --   --   --   --   --   --   CKMB  --   --   --   --   --  12.1*  --   --   --   --   --   --   TROPONINI 0.07*  --   --   --  0.52* 0.79*  --   --  1.36* 1.52* 1.53*  --   < > = values in this interval not displayed.  Estimated Creatinine Clearance: 40.2 mL/min (by C-G formula based on Cr of 0.97).   Medical History: Past Medical History  Diagnosis Date  . CAD (coronary artery disease)   . HTN (hypertension)   . Hyperlipidemia   . Cerebrovascular disease   . Arthritis   . PVD (peripheral vascular disease) (HCC)     dvt in leg  . MRSA (methicillin resistant staph aureus) culture positive   . Wears glasses   . Heart murmur   . History of bronchitis   . Memory impairment   . Complication of anesthesia     Three days after procedure pt  statrted to suffer with memoy loss that took about 4-5 days to come back according to [name]."     Assessment: 75 yo F  in ICU with sepsis.  S/p perc drain by IR 6/23 pm for hydronephrosis.  Pharmacy consulted to dose heparin for ACS/STEMI in pt w/ troponin of 0.52. Unusual spike in WBC, hgb 8.3, plts 134, both down a little.  Repeat HL is 0.35 after most recent rate increase to 800 units/hr. Most recent hgb is 8.5 and troponin 1.53.    Goal of Therapy:  Heparin level 0.3-0.7 units/ml Monitor platelets by anticoagulation protocol: Yes    Plan:  - Continue heparin at 800 units/hr - HL in am to serve as CHL - Daily HL and CBC   Vincenza Hews, PharmD, BCPS 01/22/2016, 6:28 PM Pager: [phone]

## 2016-01-22 NOTE — Progress Notes (Signed)
ANTICOAGULATION CONSULT NOTE  Pharmacy Consult for heparin Indication: chest pain/ACS  No Known Allergies  Patient Measurements: Height: '5\' 2"'$  (157.5 cm) Weight: 118 lb 6.2 oz (53.7 kg) IBW/kg (Calculated) : 50.1 Heparin Dosing Weight: 51.9 kg  Vital Signs: Temp: 98.3 F (36.8 C) (06/25 0802) Temp Source: Axillary (06/25 0802) BP: 120/64 mmHg (06/25 0930) Pulse Rate: 25 (06/25 0930)  Labs:  Recent Labs  01/21/16 0130  01/21/16 1159 01/21/16 1445 01/21/16 2100 01/22/16 0020 01/22/16 0400 01/22/16 0404 01/22/16 0832  HGB 9.2*  --  9.3*  --   --   --   --  8.3*  --   HCT 29.6*  --  29.8*  --   --   --   --  26.5*  --   PLT 200  --  179  --   --   --   --  134*  --   APTT  --   --   --   --   --  47*  --   --   --   LABPROT  --   --   --   --   --  22.8*  --   --   --   INR  --   --   --   --   --  2.03*  --   --   --   HEPARINUNFRC  --   --   --   --   --   --   --   --  0.21*  CREATININE 1.45*  < >  --  0.99 0.94  --  1.10*  --   --   CKTOTAL  --   --   --   --   --  352*  --   --   --   CKMB  --   --   --   --   --  12.1*  --   --   --   TROPONINI 0.07*  --   --   --  0.52* 0.79*  --   --  1.36*  < > = values in this interval not displayed.  Estimated Creatinine Clearance: 35.5 mL/min (by C-G formula based on Cr of 1.1).   Medical History: Past Medical History  Diagnosis Date  . CAD (coronary artery disease)   . HTN (hypertension)   . Hyperlipidemia   . Cerebrovascular disease   . Arthritis   . PVD (peripheral vascular disease) (HCC)     dvt in leg  . MRSA (methicillin resistant staph aureus) culture positive   . Wears glasses   . Heart murmur   . History of bronchitis   . Memory impairment   . Complication of anesthesia     Three days after procedure pt statrted to suffer with memoy loss that took about 4-5 days to come back according to [name]."     Assessment: 75 yo F in ICU with sepsis.  S/p perc drain by IR 6/23 pm for hydronephrosis.   Pharmacy consulted to dose heparin for ACS/STEMI in pt w/ troponin of 0.52. Unusual spike in WBC, hgb 8.3, plts 134, both down a little. Initial HL is SUBtherapeutic.    Goal of Therapy:  Heparin level 0.3-0.7 units/ml Monitor platelets by anticoagulation protocol: Yes    Plan:  -Increase heparin to 800 units/hr -Daily HL, CBC -Check level tonight    Hughes Better, PharmD, BCPS Clinical Pharmacist 01/22/2016 9:49 AM

## 2016-01-22 NOTE — Progress Notes (Signed)
eLink Physician-Brief Progress Note Patient Name: CING [place] DOB: 23-Jul-1941 MRN: [id number]   Date of Service  01/22/2016  HPI/Events of Note  Hgb 8.3 from 9.3. Currently on heparin gtt.   eICU Interventions  1. Type & Screen from 6/23 still active 2. Hgb/Hct at 1700 today     Intervention Category Intermediate Interventions: Other:  Tera Partridge 01/22/2016, 5:20 AM

## 2016-01-22 NOTE — Progress Notes (Signed)
Pt stable--still on pressors  Nephrostomy tube draining fine. Urine culture from IR still pending. On broad spectrum abx.  Will sign off at present. We will arrange proper post d/c followup and eventual procedure

## 2016-01-22 NOTE — Progress Notes (Signed)
eLink Physician-Brief Progress Note Patient Name: [name] DOB: November 09, 1940 MRN: [id number]   Date of Service  01/22/2016  HPI/Events of Note  Patient was continued delirium and mild agitation. Intermittently pulling nasal cannula oxygen off. Camera check shows patient restless in bed. Not oriented. Nurse at bedside. Mildly increased work of breathing.   eICU Interventions  1. Continue close ICU monitoring 2. Begin low-dose Precedex infusion      Intervention Category Major Interventions: Respiratory failure - evaluation and management;Delirium, psychosis, severe agitation - evaluation and management  Tera Partridge 01/22/2016, 1:12 AM

## 2016-01-22 NOTE — Progress Notes (Signed)
PULMONARY / CRITICAL CARE MEDICINE   Name: [name] MRN: [id number] DOB: 30-Sep-1940    ADMISSION DATE:  01/20/2016 CONSULTATION DATE:  01/20/16  REFERRING MD:  Dr Regenia Skeeter   CHIEF COMPLAINT: sepsis  HISTORY OF PRESENT ILLNESS:   [name] is a 75 y.o. female who presented to the emergency room here at [facility] earlier today with a two-week history of crampy abdominal pain, frequency, urgency, dysuria.  Pt in c/o n/v onset today with reported x 3 vomiting episodes today, pt c/o RLQ abd pain onset yesterday, pt reports urinary frequency onset last night, pt denies diarrhea, denies CP & SOB, A&O x4.  It was felt that the patient may have had a urinary tract infection. Earlier 6/23, she went to the plastic surgeon who recently performed debridement of a left knee wound. She is status post knee replacement in March. Knee was cleared by plastics.  In the emergency room, she had sepsis dx, low BP and fever 103 f. CT of the abdomen revealed mild to moderate right hydroureteronephrosis, 2 stones in the distal ureter-both small, but one at the UVJ and one slightly proximal. She had lactic acidosis and code sepsis activated, PCCM called for admit.  To IR for perc drain placement.    SUBJECTIVE:  Developed confusion, lethargy, desaturations overnight.  Sats dropped into low 80's on 10L.  Pt restless / agitated.  Troponin elevated with concern for possible EKG changes.  Heparin gtt started. CXR showed low lung volumes / possible edema.  ABG showed metabolic acidosis (non-gap), started on bicarbonate gtt  VITAL SIGNS: BP 108/56 mmHg  Pulse 25  Temp(Src) 98.3 F (36.8 C) (Axillary)  Resp 14  Ht '5\' 2"'$  (1.575 m)  Wt 118 lb 6.2 oz (53.7 kg)  BMI 21.65 kg/m2  SpO2 97%  HEMODYNAMICS: CVP:  [7 mmHg] 7 mmHg  VENTILATOR SETTINGS: Vent Mode:  [-]  FiO2 (%):  [0 %-100 %] 0 %  INTAKE / OUTPUT: I/O last 3 completed shifts: In: 4830.8 [I.V.:3640.8; Other:1040; IV Piggyback:150] Out: 3250  [Urine:3250]  PHYSICAL EXAMINATION: General: frail elderly female, appears agitated / confused Neuro:  Agitated / confused, picking at bed clothes, MAE, speech clear but not oriented HEENT:  MM pink/dry, no jvd Cardiovascular:  s1s2 RRR, no m/rg Lungs: periods of tachypnea with agitation, lungs bilaterally with faint basilar crackles Abdomen:  Soft, BS hypo, tender llq, no r/g Musculoskeletal:  Knee immobilzed, dry Skin:  Dry, warm   LABS:  BMET  Recent Labs Lab 01/21/16 1445 01/21/16 2100 01/22/16 0400  NA 136 135 134*  K 3.4* 3.9 3.6  CL 110 108 103  CO2 16* 17* 22  BUN 30* 28* 26*  CREATININE 0.99 0.94 1.10*  GLUCOSE 104* 133* 161*    Electrolytes  Recent Labs Lab 01/21/16 0209  01/21/16 1445 01/21/16 2100 01/22/16 0400  CALCIUM 8.5*  < > 7.8* 8.1* 8.0*  MG 1.9  --   --   --   --   PHOS 4.2  --   --   --   --   < > = values in this interval not displayed.  CBC  Recent Labs Lab 01/21/16 0130 01/21/16 1159 01/22/16 0404  WBC 46.1* 57.1* 50.7*  HGB 9.2* 9.3* 8.3*  HCT 29.6* 29.8* 26.5*  PLT 200 179 134*    Coag's  Recent Labs Lab 01/22/16 0020  APTT 47*  INR 2.03*    Sepsis Markers  Recent Labs Lab 01/20/16 1939 01/21/16 0130 01/21/16 0630  01/21/16 0843 01/22/16 0400  LATICACIDVEN 5.90*  --  3.1* 3.0*  --   PROCALCITON  --  >200.00 >200.00  --  >175.00    ABG  Recent Labs Lab 01/21/16 2110  PHART 7.353  PCO2ART 30.5*  PO2ART 73.4*    Liver Enzymes  Recent Labs Lab 01/20/16 1538  AST 115*  ALT 68*  ALKPHOS 255*  BILITOT 1.0  ALBUMIN 2.6*    Cardiac Enzymes  Recent Labs Lab 01/21/16 2100 01/22/16 0020 01/22/16 0832  TROPONINI 0.52* 0.79* 1.36*    Glucose  Recent Labs Lab 01/21/16 1221 01/21/16 1627 01/21/16 1931 01/22/16 0019 01/22/16 0418 01/22/16 0804  GLUCAP 112* 112* 118* 129* 148* 150*    Imaging Dg Chest Port 1 View  01/22/2016  CLINICAL DATA:  Sepsis EXAM: PORTABLE CHEST 1 VIEW  COMPARISON:  Portable exam 0502 hours compared 01/21/2016 FINDINGS: RIGHT jugular central venous catheter tip projecting over SVC. Minimal enlargement of cardiac silhouette. Calcified tortuous aorta. Mediastinal contours and pulmonary vascularity otherwise normal. Persistent infiltrates in mid to lower lungs bilaterally greater on RIGHT. Question underlying emphysematous changes. No gross pleural effusion or pneumothorax. IMPRESSION: Persistent pulmonary infiltrates. Aortic atherosclerosis. Electronically Signed   By: Lavonia Dana M.D.   On: 01/22/2016 08:41   Dg Chest Port 1 View  01/21/2016  CLINICAL DATA:  75 year old female with a history of hypoxia EXAM: PORTABLE CHEST 1 VIEW COMPARISON:  01/20/2016 FINDINGS: Cardiomediastinal silhouette unchanged, though partially obscured by overlying lung and pleural disease. Worsening interstitial and airspace opacities. Worsening interlobular septal thickening. Low lung volumes. Unchanged right IJ approach central venous catheter which appears to terminate superior vena cava. Calcifications of the aortic arch. No displaced fracture. IMPRESSION: Worsening interstitial and airspace opacities, which is most compatible with worsening edema and likely small pleural effusions. Underlying infection not excluded. Aortic atherosclerosis. Unchanged right IJ central catheter. Signed, Dulcy Fanny. Earleen Newport, DO [facility] Electronically Signed   By: Corrie Mckusick D.O.   On: 01/21/2016 21:31     STUDIES:  CT abdo 6/23 >> Obstructing 2 mm right UVJ stone with mild right hydroureteronephrosis. Separate 3 mm stone in the mid right pelvic ureter. Additional nonobstructing right renal stones.  Suggestion of fecal impaction in the rectum. No definite rectal wall thickening.  Moderate to severe T11 vertebral compression fracture of indeterminate chronicity  CULTURES: 6/23 BC >> proteus >>  6/23 BC Reflex >> enterobacter /  proteus species in blood  6/23 urine >> 7k insignificant growth  ANTIBIOTICS: ceftaz 6/23 >> vanc 6/23 >> 6/25  SIGNIFICANT EVENTS: 6/23 - septic shock, to IR for perc nephrostomy  LINES/TUBES: 6/23 rt IJ >> R nephrostomy 6/23 >>    ASSESSMENT / PLAN:  PULMONARY A: At Risk Atelectasis  P:   O2 as needed to supports sats > 92% Trend CXR  Pulmonary hygiene  CARDIOVASCULAR A:  Septic shock secondary to obstructive uropathy Elevated Troponin - suspect secondary to sepsis P:  Monitor CVP  Stress dose steroids  Levophed for MAP >65 Vasopressin gtt ICU monitoring of hemodynamics  Heparin gtt per pharmacy  If next troponin is decreased, would d/c heparin gtt  May need Cardiology evaluation  RENAL A:   ARF, obstructing uropathy s/p percutaneous nephrostomy 6/23 Hyponatremia P:   Appreciate IR assistance Change IVF to 1/2 NS  (non-gap)  GASTROINTESTINAL A:   Nausea / Vomiting P:   PPI NPO x ice chips until more alert   HEMATOLOGIC A:   DVT prevention post  knee surgery P:  Heparin gtt as above  Trend CBC  INFECTIOUS A:   Obstructing uropathy, Proteus UTI, septic shock, h/o MRSA, nosocomial exposure Leukocytosis - proteus in urine Recent knee debridement - on doxycycline PTA P:   D/C vanco 6/25  Continue ceftaz as above Cultures as above Trend PCT, lactic acid   ENDOCRINE A:   At risk hyperglycemia Hx Gout P:   CBG Q4 Hold home colchicine  NEUROLOGIC A:   Mild septic encephalopathy  Agitated Delirium  P:   RASS goal: 0 PRN tylenol, fentanyl for pain    FAMILY  - Updates:  Family updated at bedside 6/25 am.    - Inter-disciplinary family meet or Palliative Care meeting due by:    Noe Gens, NP-C [place] Pgr: [phone] or if no answer [phone] 01/22/2016, 10:07 AM '  Attending Note:  I have examined patient, reviewed labs, studies and notes. I have discussed the case with B Ollis, and I agree with the  data and plans as amended above. 75 yo woman with septic shock and proteus bacteremia due to R ureteral stones and obstruction with pyelonephritis. She is s/p percutaneous drainage on 6/23. Overnight she experienced some agitation and was started on precedex. She also had troponin drawn that was positive prompting initiation of heparin gtt, which she was able to tolerate without any evidence bleeding. Finally she was started on bicarb for a non-AG metabolic acidosis. This am on my exam she is much more lethargic, wakes to voice and stim but quickly back to sleep. Her hemodynamics are improved - norepi has been weaned significantly. Decreased BS at bases. We will stop her bicarb and precedex, follow her MS and metabolic status. Stop vanco today and continue coverage for proteus (+/- enterobacter based on reflex panel). I suspect that her cardiac injury is doe to stress / hypoperfusion, will d/c heparin today and plan to involve cardiology for risk stratification once she is stable from this illness.  Appreciate IR and Urology's assistance. Independent critical care time is 37 minutes.   Baltazar Apo, MD, PhD 01/22/2016, 1:44 PM [facility] [phone] or if no answer [phone]

## 2016-01-22 NOTE — Progress Notes (Addendum)
[facility] Progress Note Patient Name: [name] DOB: [birth year] MRN: [id number]   Date of Service  01/22/2016  HPI/Events of Note  Oliguria - BP = 99/51 and CVP = 12. LVEF = 30% to 35%.  eICU Interventions  Will order: 1. Lasix 20 mg IV now.      Intervention Category Intermediate Interventions: Oliguria - evaluation and management  Tuyen Uncapher Eugene 01/22/2016, 5:32 PM

## 2016-01-22 NOTE — Progress Notes (Signed)
Echocardiogram 2D Echocardiogram has been performed.  Aggie Cosier 01/22/2016, 8:56 AM

## 2016-01-23 ENCOUNTER — Encounter (HOSPITAL_COMMUNITY): Payer: Self-pay | Admitting: Physician Assistant

## 2016-01-23 DIAGNOSIS — L8990 Pressure ulcer of unspecified site, unspecified stage: Secondary | ICD-10-CM

## 2016-01-23 DIAGNOSIS — I248 Other forms of acute ischemic heart disease: Secondary | ICD-10-CM

## 2016-01-23 DIAGNOSIS — R931 Abnormal findings on diagnostic imaging of heart and coronary circulation: Secondary | ICD-10-CM | POA: Diagnosis present

## 2016-01-23 DIAGNOSIS — R0902 Hypoxemia: Secondary | ICD-10-CM | POA: Insufficient documentation

## 2016-01-23 DIAGNOSIS — R9439 Abnormal result of other cardiovascular function study: Secondary | ICD-10-CM

## 2016-01-23 DIAGNOSIS — R7989 Other specified abnormal findings of blood chemistry: Secondary | ICD-10-CM

## 2016-01-23 DIAGNOSIS — R778 Other specified abnormalities of plasma proteins: Secondary | ICD-10-CM | POA: Diagnosis present

## 2016-01-23 LAB — CBC
HCT: 25.5 % — ABNORMAL LOW (ref 36.0–46.0)
Hemoglobin: 8.1 g/dL — ABNORMAL LOW (ref 12.0–15.0)
MCH: 26.5 pg (ref 26.0–34.0)
MCHC: 31.8 g/dL (ref 30.0–36.0)
MCV: 83.3 fL (ref 78.0–100.0)
Platelets: 89 K/uL — ABNORMAL LOW (ref 150–400)
RBC: 3.06 MIL/uL — ABNORMAL LOW (ref 3.87–5.11)
RDW: 16.4 % — ABNORMAL HIGH (ref 11.5–15.5)
WBC: 53.3 K/uL (ref 4.0–10.5)

## 2016-01-23 LAB — CULTURE, BLOOD (ROUTINE X 2)

## 2016-01-23 LAB — URINE CULTURE

## 2016-01-23 LAB — BASIC METABOLIC PANEL
ANION GAP: 8 (ref 5–15)
BUN: 33 mg/dL — AB (ref 6–20)
CO2: 27 mmol/L (ref 22–32)
Calcium: 7.9 mg/dL — ABNORMAL LOW (ref 8.9–10.3)
Chloride: 105 mmol/L (ref 101–111)
Creatinine, Ser: 1.07 mg/dL — ABNORMAL HIGH (ref 0.44–1.00)
GFR, EST AFRICAN AMERICAN: 58 mL/min — AB (ref >60)
GFR, EST NON AFRICAN AMERICAN: 50 mL/min — AB (ref >60)
Glucose, Bld: 83 mg/dL (ref 65–99)
POTASSIUM: 3.1 mmol/L — AB (ref 3.5–5.1)
SODIUM: 140 mmol/L (ref 135–145)

## 2016-01-23 LAB — GLUCOSE, CAPILLARY
GLUCOSE-CAPILLARY: 68 mg/dL (ref 65–99)
GLUCOSE-CAPILLARY: 79 mg/dL (ref 65–99)
GLUCOSE-CAPILLARY: 80 mg/dL (ref 65–99)
GLUCOSE-CAPILLARY: 82 mg/dL (ref 65–99)
Glucose-Capillary: 85 mg/dL (ref 65–99)
Glucose-Capillary: 101 mg/dL — ABNORMAL HIGH (ref 65–99)
Glucose-Capillary: 90 mg/dL (ref 65–99)

## 2016-01-23 LAB — HEPARIN LEVEL (UNFRACTIONATED)
HEPARIN UNFRACTIONATED: 0.24 [IU]/mL — AB (ref 0.30–0.70)
HEPARIN UNFRACTIONATED: 0.21 [IU]/mL — AB (ref 0.30–0.70)

## 2016-01-23 LAB — PROCALCITONIN: PROCALCITONIN: 173.23 ng/mL

## 2016-01-23 MED ORDER — FUROSEMIDE 10 MG/ML IJ SOLN
40.0000 mg | Freq: Two times a day (BID) | INTRAMUSCULAR | Status: DC
  Administered 2016-01-23 (×2): 40 mg via INTRAVENOUS
  Filled 2016-01-23 (×3): qty 4

## 2016-01-23 MED ORDER — DEXTROSE 5 % IV SOLN
2.0000 g | INTRAVENOUS | Status: DC
  Administered 2016-01-23 – 2016-01-24 (×2): 2 g via INTRAVENOUS
  Filled 2016-01-23 (×4): qty 2

## 2016-01-23 MED ORDER — POTASSIUM CHLORIDE 10 MEQ/50ML IV SOLN
10.0000 meq | INTRAVENOUS | Status: AC
  Administered 2016-01-23 (×4): 10 meq via INTRAVENOUS
  Filled 2016-01-23 (×4): qty 50

## 2016-01-23 NOTE — Progress Notes (Signed)
ANTICOAGULATION CONSULT NOTE  Pharmacy Consult for heparin Indication: chest pain/ACS  No Known Allergies  Patient Measurements: Height: '5\' 2"'$  (157.5 cm) Weight: 119 lb 7.8 oz (54.2 kg) IBW/kg (Calculated) : 50.1 Heparin Dosing Weight: 51.9 kg  Vital Signs: Temp: 97.3 F (36.3 C) (06/26 0757) Temp Source: Oral (06/26 0757) BP: 106/53 mmHg (06/26 0800) Pulse Rate: 46 (06/26 0809)  Labs:  Recent Labs  01/21/16 1159  01/22/16 0020 01/22/16 0400 01/22/16 0404 01/22/16 0832 01/22/16 1230 01/22/16 1500 01/22/16 1657 01/23/16 0400  HGB 9.3*  --   --   --  8.3*  --   --   --  8.5* 8.1*  HCT 29.8*  --   --   --  26.5*  --   --   --  26.8* 25.5*  PLT 179  --   --   --  134*  --   --   --   --  89*  APTT  --   --  47*  --   --   --   --   --   --   --   LABPROT  --   --  22.8*  --   --   --   --   --   --   --   INR  --   --  2.03*  --   --   --   --   --   --   --   HEPARINUNFRC  --   --   --   --   --  0.21*  --   --  0.35 0.24*  CREATININE  --   < >  --  1.10*  --   --   --  0.97  --  1.07*  CKTOTAL  --   --  352*  --   --   --   --   --   --   --   CKMB  --   --  12.1*  --   --   --   --   --   --   --   TROPONINI  --   < > 0.79*  --   --  1.36* 1.52* 1.53*  --   --   < > = values in this interval not displayed.  Estimated Creatinine Clearance: 36.5 mL/min (by C-G formula based on Cr of 1.07).   Medical History: Past Medical History  Diagnosis Date  . CAD (coronary artery disease)   . HTN (hypertension)   . Hyperlipidemia   . Cerebrovascular disease   . Arthritis   . PVD (peripheral vascular disease) (HCC)     dvt in leg  . MRSA (methicillin resistant staph aureus) culture positive   . Wears glasses   . Heart murmur   . History of bronchitis   . Memory impairment   . Complication of anesthesia     Three days after procedure pt statrted to suffer with memoy loss that took about 4-5 days to come back according to [name]."     Assessment: 75 yo F in  ICU with sepsis.  S/p perc drain by IR 6/23 pm for hydronephrosis.  Pharmacy consulted to dose heparin for ACS/STEMI in pt w/ troponin of 0.52. Unusual spike in WBC, hgb 8.3, plts 134, both down a little.  Repeat HL is 0.35 after most recent rate increase to 800 units/hr. Most recent hgb is 8.5 and troponin 1.53.   This morning's  HL is subtherapeutic at 0.24 on heparin 800 units/hr. No issues with infusion or bleeding.  Goal of Therapy:  Heparin level 0.3-0.7 units/ml Monitor platelets by anticoagulation protocol: Yes   Plan:  Increase heparin to 1000 units/hr 8h HL Daily HL and CBC  Andrey Cota. Diona Foley, PharmD, [place] Clinical Pharmacist Pager [phone]  01/23/2016, 9:25 AM

## 2016-01-23 NOTE — Progress Notes (Signed)
PULMONARY / CRITICAL CARE MEDICINE   Name: [name] MRN: [id number] DOB: [birth year]    ADMISSION DATE:  01/20/2016 CONSULTATION DATE:  01/20/16  REFERRING MD:  Dr Regenia Skeeter   CHIEF COMPLAINT: sepsis  HISTORY OF PRESENT ILLNESS:   [name] is a 75 y.o. female who presented to the emergency room here at [facility] earlier today with a two-week history of crampy abdominal pain, frequency, urgency, dysuria.  Pt in c/o n/v onset today with reported x 3 vomiting episodes today, pt c/o RLQ abd pain onset yesterday, pt reports urinary frequency onset last night, pt denies diarrhea, denies CP & SOB, A&O x4.  It was felt that the patient may have had a urinary tract infection. Earlier 6/23, she went to the plastic surgeon who recently performed debridement of a left knee wound. She is status post knee replacement in March. Knee was cleared by plastics.  In the emergency room, she had sepsis dx, low BP and fever 103 f. CT of the abdomen revealed mild to moderate right hydroureteronephrosis, 2 stones in the distal ureter-both small, but one at the UVJ and one slightly proximal. She had lactic acidosis and code sepsis activated, PCCM called for admit.  To IR for perc drain placement.    SUBJECTIVE:  Off pressors No precedex  VITAL SIGNS: BP 105/60 mmHg  Pulse 80  Temp(Src) 97.5 F (36.4 C) (Oral)  Resp 10  Ht '5\' 2"'$  (1.575 m)  Wt 54.2 kg (119 lb 7.8 oz)  BMI 21.85 kg/m2  SpO2 94%  HEMODYNAMICS: CVP:  [11 mmHg-13 mmHg] 12 mmHg  VENTILATOR SETTINGS:    INTAKE / OUTPUT: I/O last 3 completed shifts: In: 5128.8 [I.V.:3888.8; Other:1040; IV Piggyback:200] Out: 3460 [Urine:3460]  PHYSICAL EXAMINATION: General: frail elderly female, less confused Neuro: less confused, moves all ext equally, perrl HEENT:  MM pink/dry, no jvd Cardiovascular:  s1s2 RRR, no m/rg Lungs: CTA Abdomen:  Soft, BS hypo, no r/g, no longer with tenderness Musculoskeletal:  Knee immobilzed, dry Skin:   Dry, warm   LABS:  BMET  Recent Labs Lab 01/22/16 0400 01/22/16 1500 01/23/16 0400  NA 134* 137 140  K 3.6 3.5 3.1*  CL 103 105 105  CO2 '22 24 27  '$ BUN 26* 30* 33*  CREATININE 1.10* 0.97 1.07*  GLUCOSE 161* 92 83    Electrolytes  Recent Labs Lab 01/21/16 0209  01/22/16 0400 01/22/16 1500 01/23/16 0400  CALCIUM 8.5*  < > 8.0* 7.7* 7.9*  MG 1.9  --   --   --   --   PHOS 4.2  --   --   --   --   < > = values in this interval not displayed.  CBC  Recent Labs Lab 01/21/16 1159 01/22/16 0404 01/22/16 1657 01/23/16 0400  WBC 57.1* 50.7*  --  53.3*  HGB 9.3* 8.3* 8.5* 8.1*  HCT 29.8* 26.5* 26.8* 25.5*  PLT 179 134*  --  89*    Coag's  Recent Labs Lab 01/22/16 0020  APTT 47*  INR 2.03*    Sepsis Markers  Recent Labs Lab 01/20/16 1939  01/21/16 0512 01/21/16 0843 01/22/16 0400 01/23/16 0400  LATICACIDVEN 5.90*  --  3.1* 3.0*  --   --   PROCALCITON  --   < > >200.00  --  >175.00 173.23  < > = values in this interval not displayed.  ABG  Recent Labs Lab 01/21/16 2110  PHART 7.353  PCO2ART 30.5*  PO2ART 73.4*  Liver Enzymes  Recent Labs Lab 01/20/16 1538  AST 115*  ALT 68*  ALKPHOS 255*  BILITOT 1.0  ALBUMIN 2.6*    Cardiac Enzymes  Recent Labs Lab 01/22/16 0832 01/22/16 1230 01/22/16 1500  TROPONINI 1.36* 1.52* 1.53*    Glucose  Recent Labs Lab 01/22/16 0804 01/22/16 1236 01/22/16 1634 01/22/16 1927 01/23/16 0008 01/23/16 0350  GLUCAP 150* 116* 73 72 79 82    Imaging No results found.   STUDIES:  CT abdo 6/23 >> Obstructing 2 mm right UVJ stone with mild right hydroureteronephrosis. Separate 3 mm stone in the mid right pelvic ureter. Additional nonobstructing right renal stones.  Suggestion of fecal impaction in the rectum. No definite rectal wall thickening.  Moderate to severe T11 vertebral compression fracture of indeterminate chronicity Echo 6/25>>>Septal , apical mid and distal inferior wall and   distal anterior wall hypokinesis The cavity size was moderately  dilated. There was mild focal basal hypertrophy of the septum.  Systolic function was severely reduced. The estimated ejection  fraction was in the range of 25% to 30%.  CULTURES: 6/23 BC >> proteus >>  6/23 BC Reflex >> enterobacter / proteus species in blood  6/23 urine >> 7k insignificant growth  ANTIBIOTICS: ceftaz 6/23 >> vanc 6/23 >> 6/25  SIGNIFICANT EVENTS: 6/23 - septic shock, to IR for perc nephrostomy 6/26- no pressors  LINES/TUBES: 6/23 rt IJ >>>6/26 R nephrostomy 6/23 >>   ASSESSMENT / PLAN:  PULMONARY A: At Risk Atelectasis, pulm edema P:   O2 as needed to supports sats > 92% Lasix to neg balance  CARDIOVASCULAR A:  Septic shock secondary to obstructive uropathy Elevated Troponin  - r./o MI, has regional changes Cardiomyoapthy P:  cvp 12, lasix Stress dose steroids - not needed cortisol 31 Levophed for MAP >60 Vasopressin gtt off Dc line ICU monitoring of hemodynamics  Heparin gtt per pharmacy  Needs cardiology to see her with echo resul  RENAL A:   ARF, obstructing uropathy s/p percutaneous nephrostomy 6/23 Hyponatremia resolved POs balance hypok P:   1/2 NS kvo k supp Lasix increase  GASTROINTESTINAL A:   Nausea / Vomiting P:   PPI advance  HEMATOLOGIC A:   DVT prevention post knee surgery Leukocytosis, clinical progress P:  Heparin gtt as above  Trend CBC in am with diff Dc line scd  INFECTIOUS A:   Obstructing uropathy, Proteus UTI, septic shock, h/o MRSA, nosocomial exposure Leukocytosis - proteus in urine Recent knee debridement - on doxycycline PTA P:   Continue ceftaz she has made clinical progress with this agent Dc line neck Repeat bc Trend PCT is down  ENDOCRINE A:   At risk hyperglycemia Hx Gout P:   CBG Q4  NEUROLOGIC A:   Mild septic encephalopathy  Agitated Delirium improved P:   RASS goal: 0 PRN tylenol, fentanyl for  pain Pt consult   FAMILY  - Updates:  none   - Inter-disciplinary family meet or Palliative Care meeting due by:    To triad, tele  Lavon Paganini. Titus Mould, MD, [place] Pgr: [facility]

## 2016-01-23 NOTE — Progress Notes (Signed)
PT Cancellation Note  Patient Details Name: [name] MRN: [id number] DOB: 1941-06-26   Cancelled Treatment:    Reason Eval/Treat Not Completed: Other (comment) Pt's daughter in law present and requests to let pt sleep as she just fell asleep. Obtained information on PLOF/history as daughter in law in caregiver. Will attempt to see in PM for evaluation as appropriate.   [name] 01/23/2016, 12:28 PM Wray Kearns, Nara Visa, DPT [phone]

## 2016-01-23 NOTE — Progress Notes (Signed)
Referring Physician(s): Dr. Preston Fleeting  Supervising Physician: Sandi Mariscal  Patient Status:  Inpatient  Chief Complaint: (R)hydronephrosis  Subjective: S/p (R)PCN 6/23 Good UOP Urology notes/plan reviewed.  Allergies: Review of patient's allergies indicates no known allergies.  Medications:  Current facility-administered medications:  .  0.45 % sodium chloride infusion, , Intravenous, Continuous, Raylene Miyamoto, MD, Last Rate: 20 mL/hr at 01/23/16 0900 .  0.9 %  sodium chloride infusion, 250 mL, Intravenous, PRN, Raylene Miyamoto, MD .  acetaminophen (TYLENOL) tablet 650 mg, 650 mg, Oral, Q6H PRN, Javier Glazier, MD .  antiseptic oral rinse (CPC / CETYLPYRIDINIUM CHLORIDE 0.05%) solution 7 mL, 7 mL, Mouth Rinse, q12n4p, Javier Glazier, MD, 7 mL at 01/22/16 1600 .  cefTRIAXone (ROCEPHIN) 2 g in dextrose 5 % 50 mL IVPB, 2 g, Intravenous, Q24H, Raylene Miyamoto, MD, 2 g at 01/23/16 1153 .  chlorhexidine (PERIDEX) 0.12 % solution 15 mL, 15 mL, Mouth Rinse, BID, Javier Glazier, MD, 15 mL at 01/23/16 1000 .  furosemide (LASIX) injection 40 mg, 40 mg, Intravenous, Q12H, Raylene Miyamoto, MD, 40 mg at 01/23/16 0741 .  heparin ADULT infusion 100 units/mL (25000 units/294m sodium chloride 0.45%), 1,000 Units/hr, Intravenous, Continuous, CRebecka Apley ROperating Room Services Last Rate: 10 mL/hr at 01/23/16 0927, 1,000 Units/hr at 01/23/16 0927 .  insulin aspart (novoLOG) injection 0-9 Units, 0-9 Units, Subcutaneous, Q4H, DRaylene Miyamoto MD, 1 Units at 01/22/16 [phone].  morphine 2 MG/ML injection 2 mg, 2 mg, Intravenous, Q1H PRN, OGuy Begin MD, 2 mg at 01/23/16 0507 .  ondansetron (ZOFRAN) injection 4 mg, 4 mg, Intravenous, Q6H PRN, DRaylene Miyamoto MD    Vital Signs: BP 127/74 mmHg  Pulse 88  Temp(Src) 97.8 F (36.6 C) (Tympanic)  Resp 18  Ht '5\' 2"'$  (1.575 m)  Wt 126 lb 12.2 oz (57.5 kg)  BMI 23.18 kg/m2  SpO2 95%  Physical Exam (R)PCN intact Site clean,  NT Output clear urine  Imaging: Ir Nephrostomy Placement Right  01/21/2016  CLINICAL DATA:  Right ureteral obstruction from calculus causing hydronephrosis and acute sepsis. The patient requires emergent percutaneous nephrostomy tube to decompress the right kidney. EXAM: 1. ULTRASOUND GUIDANCE FOR PUNCTURE OF THE RIGHT RENAL COLLECTING SYSTEM. 2. RIGHT PERCUTANEOUS NEPHROSTOMY TUBE PLACEMENT. COMPARISON:  CT of the abdomen and pelvis earlier today on 01/20/2016. ANESTHESIA/SEDATION: 1.0 mg IV Versed; 25 mcg IV Fentanyl. Total Moderate Sedation Time 15 minutes The patient's level of consciousness and physiologic status were continuously monitored during the procedure by Radiology nursing. CONTRAST:  20 ml Omnipaque 300 MEDICATIONS: No additional medications. Scheduled IV Fortaz and vancomycin were started just prior to the procedure. FLUOROSCOPY TIME:  1 minutes and 6 seconds. PROCEDURE: The procedure, risks, benefits, and alternatives were explained to the patient's son. Questions regarding the procedure were encouraged and answered. The patient's son understands and consents to the procedure. The right flank region was prepped with chlorhexidine in a sterile fashion, and a sterile drape was applied covering the operative field. A sterile gown and sterile gloves were used for the procedure. Local anesthesia was provided with 1% Lidocaine. Ultrasound was used to localize the right kidney. Under direct ultrasound guidance, a 21 gauge needle was advanced into the renal collecting system. Ultrasound image documentation was performed. Aspiration of urine sample was performed followed by contrast injection. A transitional dilator was advanced over a guidewire. Percutaneous tract dilatation was then performed over the guidewire. A 10 -French percutaneous nephrostomy tube was  then advanced and formed in the collecting system. Catheter position was confirmed by fluoroscopy after contrast injection. The catheter was  secured at the skin with a Prolene retention suture and Stat-Lock device. A gravity bag was placed. COMPLICATIONS: None. FINDINGS: Ultrasound demonstrates moderate right hydronephrosis. After access of the collecting system, there was return of blood tinged urine. A sample was sent for culture analysis. The 10 French nephrostomy tube was formed in the renal pelvis. IMPRESSION: Right percutaneous nephrostomy tube placement for acute decompression of the right kidney. A 10 French catheter was placed and formed in the renal pelvis. This tube was attached to a gravity drainage bag. A sample of urine was sent for culture analysis. Electronically Signed   By: Aletta Edouard M.D.   On: 01/21/2016 09:12    Labs:  CBC:  Recent Labs  01/21/16 0130 01/21/16 1159 01/22/16 0404 01/22/16 1657 01/23/16 0400  WBC 46.1* 57.1* 50.7*  --  53.3*  HGB 9.2* 9.3* 8.3* 8.5* 8.1*  HCT 29.6* 29.8* 26.5* 26.8* 25.5*  PLT 200 179 134*  --  89*    COAGS:  Recent Labs  09/20/15 1558 01/04/16 1204 01/22/16 0020  INR 1.11 1.15 2.03*  APTT 37  --  47*    BMP:  Recent Labs  01/21/16 2100 01/22/16 0400 01/22/16 1500 01/23/16 0400  NA 135 134* 137 140  K 3.9 3.6 3.5 3.1*  CL 108 103 105 105  CO2 17* '22 24 27  '$ GLUCOSE 133* 161* 92 83  BUN 28* 26* 30* 33*  CALCIUM 8.1* 8.0* 7.7* 7.9*  CREATININE 0.94 1.10* 0.97 1.07*  GFRNONAA 58* 48* 56* 50*  GFRAA >60 56* >60 58*    LIVER FUNCTION TESTS:  Recent Labs  09/20/15 1558 01/20/16 1538  BILITOT 0.3 1.0  AST 22 115*  ALT 18 68*  ALKPHOS 115 255*  PROT 7.4 6.9  ALBUMIN 2.8* 2.6*    Assessment and Plan: (R)hydronephrosis s/p (R)PCN 6/23 Good function/UOP Plan p Dc per Urology.  Electronically Signed: Ascencion Dike 01/23/2016, 2:49 PM   I spent a total of 15 Minutes at the the patient's bedside AND on the patient's hospital floor or unit, greater than 50% of which was counseling/coordinating care for (R)PCN

## 2016-01-23 NOTE — Progress Notes (Signed)
Dressing on right side of neck where central line removed is stable but does have shadowing on dressing.

## 2016-01-23 NOTE — Progress Notes (Signed)
Pt is confused.  Primary RN, Joellen Jersey made aware of central line being removed.    Due to first drsg being saturated due to being on Heparin I added another pressure drsg to the first one.   Encouraged not to get pt out of bed for an hour due to Heparin infusion and longer time required for hemostasis to be achieved.

## 2016-01-23 NOTE — Progress Notes (Signed)
ANTICOAGULATION CONSULT NOTE - Follow Up Consult  Pharmacy Consult for heparin Indication: chest pain/ACS  No Known Allergies  Patient Measurements: Height: '5\' 2"'$  (157.5 cm) Weight: 126 lb 12.2 oz (57.5 kg) IBW/kg (Calculated) : 50.1 Heparin Dosing Weight: 51.9 kg  Vital Signs: Temp: 97.8 F (36.6 C) (06/26 1404) Temp Source: Tympanic (06/26 1404) BP: 127/74 mmHg (06/26 1404) Pulse Rate: 88 (06/26 1404)  Labs:  Recent Labs  01/21/16 1159  01/22/16 0020 01/22/16 0400 01/22/16 0404  01/22/16 0832 01/22/16 1230 01/22/16 1500 01/22/16 1657 01/23/16 0400 01/23/16 1827  HGB 9.3*  --   --   --  8.3*  --   --   --   --  8.5* 8.1*  --   HCT 29.8*  --   --   --  26.5*  --   --   --   --  26.8* 25.5*  --   PLT 179  --   --   --  134*  --   --   --   --   --  89*  --   APTT  --   --  47*  --   --   --   --   --   --   --   --   --   LABPROT  --   --  22.8*  --   --   --   --   --   --   --   --   --   INR  --   --  2.03*  --   --   --   --   --   --   --   --   --   HEPARINUNFRC  --   --   --   --   --   < > 0.21*  --   --  0.35 0.24* 0.21*  CREATININE  --   < >  --  1.10*  --   --   --   --  0.97  --  1.07*  --   CKTOTAL  --   --  352*  --   --   --   --   --   --   --   --   --   CKMB  --   --  12.1*  --   --   --   --   --   --   --   --   --   TROPONINI  --   < > 0.79*  --   --   --  1.36* 1.52* 1.53*  --   --   --   < > = values in this interval not displayed.  Estimated Creatinine Clearance: 36.5 mL/min (by C-G formula based on Cr of 1.07).   Medications:  Scheduled:  . antiseptic oral rinse  7 mL Mouth Rinse q12n4p  . cefTRIAXone (ROCEPHIN)  IV  2 g Intravenous Q24H  . chlorhexidine  15 mL Mouth Rinse BID  . furosemide  40 mg Intravenous Q12H  . insulin aspart  0-9 Units Subcutaneous Q4H   Infusions:  . heparin 1,000 Units/hr (01/23/16 4665)    Assessment: 75 yo female with ACS is currently on subtherapeutic heparin.  Heparin level is 0.21.  Per RN, no  problem with infusion  Goal of Therapy:  Heparin level 0.3-0.7 units/ml Monitor platelets by anticoagulation protocol: Yes   Plan:  - increase heparin to 1100 units/hr -  8 hr heparin level  Helene Bernstein, Tsz-Yin 01/23/2016,7:41 PM

## 2016-01-23 NOTE — Progress Notes (Signed)
eLink Physician-Brief Progress Note Patient Name: [name] DOB: 1941/07/05 MRN: [id number]   Date of Service  01/23/2016  HPI/Events of Note  Hypokalemia 3.1. Has CVL access. Creatinine 1.07.  eICU Interventions  KCl 39mq IV x4 runs     Intervention Category Intermediate Interventions: Electrolyte abnormality - evaluation and management  JTera Partridge6/26/2017, 4:51 AM

## 2016-01-23 NOTE — Progress Notes (Signed)
NURSING PROGRESS NOTE  [name] [id number] Transfer Data: 01/23/2016 10:30 AM Attending Provider: Raylene Miyamoto, MD HXT:[id number] Courtney Breed, MD Code Status: FUll  [name] is a 75 y.o. female patient transferred from 2 MW   -No acute distress noted.  -No complaints of shortness of breath.  -No complaints of chest pain.   Cardiac Monitoring: Box # 4 in place.   Blood pressure 95/63, pulse 93, temperature 97.3 F (36.3 C), temperature source Oral, resp. rate 16, height '5\' 2"'$  (1.575 m), weight 57.5 kg (126 lb 12.2 oz), SpO2 95 %.   IV Fluids:  IV in place, May refer to [facility].  Allergies:  Review of patient's allergies indicates no known allergies.  Past Medical History:   has a past medical history of CAD (coronary artery disease); HTN (hypertension); Hyperlipidemia; Cerebrovascular disease; Arthritis; PVD (peripheral vascular disease) ([place]); MRSA (methicillin resistant staph aureus) culture positive; Wears glasses; Heart murmur; History of bronchitis; Memory impairment; and Complication of anesthesia.  Past Surgical History:   has past surgical history that includes Knee surgery; Abdominal hysterectomy; Total hip arthroplasty (Right, 09/10/2012); Total hip arthroplasty (Right, 09/10/2012); Coronary angioplasty (07); Total knee arthroplasty (Left, 09/28/2015); I&D knee with poly exchange (Left, 10/12/2015); I&D extremity (Left, 10/18/2015); Eye surgery (Bilateral); Colonoscopy; Esophagogastroduodenoscopy; Skin split graft (Left, 11/16/2015); Application if wound vac (Left, 11/16/2015); Free flap to extremity (Left, 11/16/2015); I&D extremity (Left, 12/08/2015); Application of a-cell of extremity (Left, 12/08/2015); Application if wound vac (Left, 12/08/2015); and Incision and drainage of wound (Left, 01/04/2016).  Social History:   reports that she quit smoking about 3 months ago. Her smoking use included Cigarettes. She has a 25 pack-year smoking history. She has never used smokeless tobacco.  She reports that she does not drink alcohol or use illicit drugs.  Skin: Intact, skin graft noted to Left hip area, foam noted as well.   Patient/Family orientated to room. Information packet given to patient/family. Admission inpatient armband information verified with patient/family to include name and date of birth and placed on patient arm. Side rails up x 2, fall assessment and education completed with patient/family. Patient/family able to verbalize understanding of risk associated with falls and verbalized understanding to call for assistance before getting out of bed. Call light within reach. Patient/family able to voice and demonstrate understanding of unit orientation instructions.    Will continue to evaluate and treat per MD orders.

## 2016-01-23 NOTE — Progress Notes (Signed)
Plastic Surgery Follow up:  Subjective: Events over weekend noted. Transferred out to floor today.  Daughter in law in room during dressing change and updated concerning the left knee wound which is stable.  Patient is doing better, but still somewhat disoriented and restless.   The left knee dressing was removed and the wound is stable. Superficially open now and about 8 x 5 cm and flush with surrounding skin. She has incorporated the Acell which was applied in the office.  No local signs of infection.   Objective: Vital signs in last 24 hours: Temp:  [97.3 F (36.3 C)-98.5 F (36.9 C)] 97.7 F (36.5 C) (06/26 1023) Pulse Rate:  [25-103] 93 (06/26 1023) Resp:  [9-24] 16 (06/26 1023) BP: (78-124)/(42-69) 95/63 mmHg (06/26 1023) SpO2:  [90 %-100 %] 95 % (06/26 1023) Weight:  [54.2 kg (119 lb 7.8 oz)-57.5 kg (126 lb 12.2 oz)] 57.5 kg (126 lb 12.2 oz) (06/26 1023) Last BM Date: 01/21/16  Intake/Output from previous day: 06/25 0701 - 06/26 0700 In: 1707.8 [I.V.:1507.8; IV Piggyback:200] Out: 9528 [Urine:1470] Intake/Output this shift: Total I/O In: 206 [I.V.:106; IV Piggyback:100] Out: 1375 [Urine:1375]  General appearance: mild distress and somnolent but arouses to voice. Does not seem to recognize me despite seeing me in office weekly.  The left knee dressing was removed and the Acell has incorporated. the area measures ~ 8 x 5 cm and is very superficial. No local signs of infection.   Lab Results:   Recent Labs  01/22/16 0404 01/22/16 1657 01/23/16 0400  WBC 50.7*  --  53.3*  HGB 8.3* 8.5* 8.1*  HCT 26.5* 26.8* 25.5*  PLT 134*  --  89*   BMET  Recent Labs  01/22/16 1500 01/23/16 0400  NA 137 140  K 3.5 3.1*  CL 105 105  CO2 24 27  GLUCOSE 92 83  BUN 30* 33*  CREATININE 0.97 1.07*  CALCIUM 7.7* 7.9*   PT/INR  Recent Labs  01/22/16 0020  LABPROT 22.8*  INR 2.03*   ABG  Recent Labs  01/21/16 2110  PHART 7.353  HCO3 16.6*     Studies/Results: Dg Chest Port 1 View  01/22/2016  CLINICAL DATA:  Sepsis EXAM: PORTABLE CHEST 1 VIEW COMPARISON:  Portable exam 0502 hours compared 01/21/2016 FINDINGS: RIGHT jugular central venous catheter tip projecting over SVC. Minimal enlargement of cardiac silhouette. Calcified tortuous aorta. Mediastinal contours and pulmonary vascularity otherwise normal. Persistent infiltrates in mid to lower lungs bilaterally greater on RIGHT. Question underlying emphysematous changes. No gross pleural effusion or pneumothorax. IMPRESSION: Persistent pulmonary infiltrates. Aortic atherosclerosis. Electronically Signed   By: Lavonia Dana M.D.   On: 01/22/2016 08:41   Dg Chest Port 1 View  01/21/2016  CLINICAL DATA:  75 year old female with a history of hypoxia EXAM: PORTABLE CHEST 1 VIEW COMPARISON:  01/20/2016 FINDINGS: Cardiomediastinal silhouette unchanged, though partially obscured by overlying lung and pleural disease. Worsening interstitial and airspace opacities. Worsening interlobular septal thickening. Low lung volumes. Unchanged right IJ approach central venous catheter which appears to terminate superior vena cava. Calcifications of the aortic arch. No displaced fracture. IMPRESSION: Worsening interstitial and airspace opacities, which is most compatible with worsening edema and likely small pleural effusions. Underlying infection not excluded. Aortic atherosclerosis. Unchanged right IJ central catheter. Signed, Dulcy Fanny. Earleen Newport, DO [facility] Electronically Signed   By: Corrie Mckusick D.O.   On: 01/21/2016 21:31    Anti-infectives: Anti-infectives    Start  Dose/Rate Route Frequency Ordered Stop   01/23/16 1030  cefTRIAXone (ROCEPHIN) 2 g in dextrose 5 % 50 mL IVPB     2 g 100 mL/hr over 30 Minutes Intravenous Every 24 hours 01/23/16 0953     01/21/16 2100  vancomycin (VANCOCIN) 500 mg in sodium chloride 0.9 % 100 mL IVPB  Status:   Discontinued     500 mg 100 mL/hr over 60 Minutes Intravenous Every 24 hours 01/20/16 2045 01/22/16 1044   01/21/16 2100  cefTAZidime (FORTAZ) 1 g in dextrose 5 % 50 mL IVPB  Status:  Discontinued     1 g 100 mL/hr over 30 Minutes Intravenous Every 24 hours 01/20/16 2045 01/21/16 1800   01/21/16 1815  cefTAZidime (FORTAZ) 2 g in dextrose 5 % 50 mL IVPB  Status:  Discontinued     2 g 100 mL/hr over 30 Minutes Intravenous Every 12 hours 01/21/16 1800 01/23/16 0953   01/21/16 1700  cefTRIAXone (ROCEPHIN) 1 g in dextrose 5 % 50 mL IVPB  Status:  Discontinued     1 g 100 mL/hr over 30 Minutes Intravenous Every 24 hours 01/20/16 1615 01/20/16 2036   01/20/16 2045  vancomycin (VANCOCIN) IVPB 1000 mg/200 mL premix  Status:  Discontinued     1,000 mg 200 mL/hr over 60 Minutes Intravenous  Once 01/20/16 2040 01/22/16 1000   01/20/16 2045  cefTAZidime (FORTAZ) 2 g in dextrose 5 % 50 mL IVPB     2 g 100 mL/hr over 30 Minutes Intravenous  Once 01/20/16 2040 01/20/16 2140   01/20/16 1745  azithromycin (ZITHROMAX) 500 mg in dextrose 5 % 250 mL IVPB     500 mg 250 mL/hr over 60 Minutes Intravenous  Once 01/20/16 1734 01/20/16 1948   01/20/16 1615  cefTRIAXone (ROCEPHIN) 2 g in dextrose 5 % 50 mL IVPB     2 g 100 mL/hr over 30 Minutes Intravenous  Once 01/20/16 1611 01/20/16 1753      Assessment/Plan: Recommend daily dressing changes to the left knee with surgical lubricant and gauze.   She will need to remain in the splint and the knee immobilizer for now to prevent knee flexion.  Will let Dr. Sharol Given know of her admit with sepsis.  Will not plan to see unless concerns about the wound.   LOS: 3 days   Waynesha Rammel,PA-C Plastic Surgery [phone]

## 2016-01-23 NOTE — Progress Notes (Signed)
PT Cancellation Note  Patient Details Name: [name] MRN: [id number] DOB: 11/03/1940   Cancelled Treatment:    Reason Eval/Treat Not Completed: Patient at procedure or test/unavailable Attempting to perform PT evaluation but IV team entering room to remove IJ IV. Attempted to see pt x2 today. Pt very emotional. Will follow up on 6/27 as time allows.   Marguarite Arbour A Lewanna Petrak 01/23/2016, 2:32 PM Wray Kearns, [place], DPT [phone]

## 2016-01-23 NOTE — Care Management Important Message (Signed)
Important Message  Patient Details  Name: [name] MRN: [id number] Date of Birth: 18-Feb-1941   Medicare Important Message Given:  Yes    Nathen May 01/23/2016, 10:31 AM

## 2016-01-23 NOTE — Consult Note (Signed)
CARDIOLOGY CONSULT NOTE   Patient ID: [name] MRN: [id number] DOB/AGE: 1941-07-09 75 y.o.  Admit date: 01/20/2016  Primary Physician   Courtney Ruths, MD Primary Cardiologist   Dr [name] Reason for Consultation   Elevated troponin, new LVD Requesting MD: Dr [name]  [id number] [name] is a 75 y.o. year old female with a history of DES CFX & RCA 2008, PAD w/ bilat CIA stenosis treated medically, HTN, HLD, SEM.  MV 07/2014 w/ nl LV function, no scar or ischemia, EF 68%  S/p L-TKR 09/28/2015, w/ subsequent infections, skin grafts x 2, third needed. Saw [facility] 12/28/2015 and was doing well.  Admitted 06/23 with R hydronephrosis, N&V, sepsis. Troponin elevated and EF now 25%, cards asked to see. She has a nephrostomy tube in place. Pt has been confused.  [name] is confused. She is oriented to name and place. She does not remember having any chest pain. Her daughter is present and confirms [name] has not complained of chest pain. She is not SOB.   She was started on IV Lasix 40 mg q 12 hr today and has been on heparin.   She was on BiPAP at one point, but is now on nasal cannula. She has not had LE edema.   She was on Precedex and pressors, but those have been discontinued. She was getting steroids, but those have been discontinued. ABX have been focused, she is now on [place] only.      Past Medical History  Diagnosis Date  . CAD (coronary artery disease)   . HTN (hypertension)   . Hyperlipidemia   . Cerebrovascular disease   . Arthritis   . PVD (peripheral vascular disease) (HCC)     dvt in leg  . MRSA (methicillin resistant staph aureus) culture positive   . Wears glasses   . Heart murmur   . History of bronchitis   . Memory impairment   . Complication of anesthesia     Three days after procedure pt statrted to suffer with memoy loss that took about 4-5 days to come back according to pt son [name]."     Past Surgical History  Procedure Laterality  Date  . Knee surgery      denies  . Abdominal hysterectomy    . Total hip arthroplasty Right 09/10/2012    Dr [name]  . Total hip arthroplasty Right 09/10/2012    Procedure: TOTAL HIP ARTHROPLASTY;  Surgeon: Courtney Minion, MD;  Location: [place];  Service: Orthopedics;  Laterality: Right;  Right Total Hip Arthroplasty  . Coronary angioplasty  07    3 stents placed  . Total knee arthroplasty Left 09/28/2015    Procedure: TOTAL KNEE ARTHROPLASTY;  Surgeon: Courtney Minion, MD;  Location: [place];  Service: Orthopedics;  Laterality: Left;  . I&d knee with poly exchange Left 10/12/2015    Procedure: Irrigation and Debridement , Poly Exchange, Antibiotic Beads Left Knee;  Surgeon: Courtney Minion, MD;  Location: [place];  Service: Orthopedics;  Laterality: Left;  . I&d extremity Left 10/18/2015    Procedure: Left Knee Washout, Reclosure;  Surgeon: Courtney Minion, MD;  Location: [place];  Service: Orthopedics;  Laterality: Left;  [name] Eye surgery Bilateral     cataracts  . Colonoscopy    . Esophagogastroduodenoscopy    . Skin split graft Left 11/16/2015    Procedure: LEFT GASTROC MUSCLE FLAP WITH SKIN GRAFT SPLIT THICKNESS AND VAC PLACEMENT;  Surgeon: Courtney Lofty  [name], DO;  Location: [place];  Service: Plastics;  Laterality: Left;  . Application of wound vac Left 11/16/2015    Procedure: APPLICATION OF WOUND VAC;  Surgeon: Courtney Lofty [name], DO;  Location: [place];  Service: Plastics;  Laterality: Left;  . Free flap to extremity Left 11/16/2015    Procedure: FREE FLAP TO EXTREMITY;  Surgeon: Courtney Lofty [name], DO;  Location: [place];  Service: Plastics;  Laterality: Left;  . I&d extremity Left 12/08/2015    Procedure: IRRIGATION AND DEBRIDEMENT knee;  Surgeon: Courtney Lofty [name], DO;  Location: [place];  Service: Plastics;  Laterality: Left;  . Application of a-cell of extremity Left 12/08/2015    Procedure: APPLICATION OF A-CELL OF knee;  Surgeon: Courtney Lofty [name], DO;  Location: [place];  Service: Plastics;   Laterality: Left;  . Application of wound vac Left 12/08/2015    Procedure: APPLICATION OF WOUND VAC  AND CAST to knee;  Surgeon: Courtney Lofty [name], DO;  Location: [place];  Service: Plastics;  Laterality: Left;  . Incision and drainage of wound Left 01/04/2016    Procedure: DEBRIDEMENT OF LEFT KNEE WOUND WITH ACELL AND WOUND VAC PLACEMENT;  Surgeon: Courtney Lofty [name], DO;  Location: [place];  Service: Plastics;  Laterality: Left;    No Known Allergies  I have reviewed the patient's current medications . antiseptic oral rinse  7 mL Mouth Rinse q12n4p  . cefTAZidime (FORTAZ)  IV  2 g Intravenous Q12H  . chlorhexidine  15 mL Mouth Rinse BID  . furosemide  40 mg Intravenous Q12H  . insulin aspart  0-9 Units Subcutaneous Q4H   . sodium chloride 20 mL/hr at 01/23/16 0800  . heparin 800 Units/hr (01/23/16 0800)   sodium chloride, acetaminophen, morphine injection, ondansetron (ZOFRAN) IV  Prior to Admission medications   Medication Sig Start Date End Date Taking? Authorizing Provider  amLODipine (NORVASC) 10 MG tablet TAKE 1 TABLET (10 MG TOTAL) BY MOUTH DAILY. Patient taking differently: Take 10 mg by mouth daily.  08/27/14  Yes Lelon Perla, MD  aspirin EC 81 MG tablet Take 81 mg by mouth daily.   Yes Historical Provider, MD  atorvastatin (LIPITOR) 40 MG tablet TAKE ONE TABLET BY MOUTH DAILY. Patient taking differently: Take 40 mg by mouth daily.  09/15/15  Yes Lelon Perla, MD  budesonide-formoterol (SYMBICORT) 160-4.5 MCG/ACT inhaler Inhale 2 puffs into the lungs 2 (two) times daily.   Yes Historical Provider, MD  colchicine 0.6 MG tablet Take 0.6 mg by mouth daily as needed (gout flare).  08/30/15  Yes Historical Provider, MD  diclofenac (VOLTAREN) 75 MG EC tablet Take 75 mg by mouth 2 (two) times daily.  12/18/11  Yes Historical Provider, MD  doxycycline (VIBRA-TABS) 100 MG tablet Take 1 tablet (100 mg total) by mouth 2 (two) times daily. Patient taking differently: Take 100 mg by  mouth 2 (two) times daily. Continuous course until  05/25/16 11/24/15 05/25/16 Yes Thayer Headings, MD  Glucosamine-Chondroit-Vit C-Mn (GLUCOSAMINE-CHONDROITIN) CAPS Take 1 capsule by mouth daily.   Yes Historical Provider, MD  hydrochlorothiazide (MICROZIDE) 12.5 MG capsule TAKE 1 CAPSULE (12.5 MG TOTAL) BY MOUTH EVERY MORNING. Patient taking differently: Take 12.5 mg by mouth daily.  08/27/14  Yes Lelon Perla, MD  lisinopril (PRINIVIL,ZESTRIL) 40 MG tablet TAKE 1 TABLET (40 MG TOTAL) BY MOUTH DAILY. Patient taking differently: Take 40 mg by mouth daily.  08/27/14  Yes Lelon Perla, MD  Multiple Vitamin (MULITIVITAMIN WITH MINERALS) TABS  Take 1 tablet by mouth daily.   Yes Historical Provider, MD  nitrofurantoin, macrocrystal-monohydrate, (MACROBID) 100 MG capsule Take 100 mg by mouth 2 (two) times daily.   Yes Historical Provider, MD  polyethylene glycol (MIRALAX / GLYCOLAX) packet Take 17 g by mouth daily. Patient taking differently: Take 17 g by mouth daily as needed for moderate constipation.  11/17/15  Yes Shawn Montgomery Rayburn, PA-C  traMADol (ULTRAM) 50 MG tablet Take 50 mg by mouth every 12 (twelve) hours as needed for moderate pain.    Yes Historical Provider, MD  enoxaparin (LOVENOX) 30 MG/0.3ML injection Inject 0.3 mLs (30 mg total) into the skin daily. Patient taking differently: Inject 30 mg into the skin daily with supper.  11/17/15   Shawn Annamarie Dawley, PA-C     Social History   Social History  . Marital Status: Single    Spouse Name: N/A  . Number of Children: N/A  . Years of Education: N/A   Occupational History  . Retired    Social History Main Topics  . Smoking status: Former Smoker -- 0.50 packs/day for 50 years    Types: Cigarettes    Quit date: 09/28/2015  . Smokeless tobacco: Never Used     Comment: quit 09/27/15  . Alcohol Use: No  . Drug Use: No  . Sexual Activity: No   Other Topics Concern  . Not on file   Social History Narrative   She  has been living with her daughter when not in rehab since the surgery in March.    Family Status  Relation Status Death Age  . Mother Deceased   . Father Deceased    Family History  Problem Relation Age of Onset  . Breast cancer Mother   . Leukemia Father   . Stroke Sister   . Bone cancer       ROS:  Full 14 point review of systems complete and found to be negative unless listed above.  Physical Exam: Blood pressure 106/53, pulse 46, temperature 97.3 F (36.3 C), temperature source Oral, resp. rate 14, height '5\' 2"'$  (1.575 m), weight 119 lb 7.8 oz (54.2 kg), SpO2 96 %.  General: Well developed, well nourished, female in no acute distress Head: Eyes PERRLA, No xanthomas.   Normocephalic and atraumatic, oropharynx without edema or exudate. Dentition: poor Lungs: decreased BS bases w/ some rales Heart: HRRR S1 S2, no rub/gallop, no obvious murmur. pulses are 2+ all 4 extrem.   Neck: No carotid bruits. No lymphadenopathy.  JVD 10 cm Abdomen: Bowel sounds present, abdomen soft and non-tender without masses or hernias noted. Msk:  No spine or cva tenderness. Generalized weakness, no joint deformities or effusions. Extremities: No clubbing or cyanosis. No edema.  Neuro: Alert and oriented X 2. No focal deficits noted. Skin: No rashes or lesions noted.  Labs:   Lab Results  Component Value Date   WBC 53.3* 01/23/2016   HGB 8.1* 01/23/2016   HCT 25.5* 01/23/2016   MCV 83.3 01/23/2016   PLT 89* 01/23/2016    Recent Labs  01/22/16 0020  INR 2.03*    Recent Labs Lab 01/20/16 1538  01/23/16 0400  NA 136  < > 140  K 4.7  < > 3.1*  CL 104  < > 105  CO2 16*  < > 27  BUN 36*  < > 33*  CREATININE 1.52*  < > 1.07*  CALCIUM 10.4*  < > 7.9*  PROT 6.9  --   --  BILITOT 1.0  --   --   ALKPHOS 255*  --   --   ALT 68*  --   --   AST 115*  --   --   GLUCOSE 110*  < > 83  ALBUMIN 2.6*  --   --   < > = values in this interval not displayed. MAGNESIUM  Date Value Ref Range  Status  01/21/2016 1.9 1.7 - 2.4 mg/dL Final    Recent Labs  01/22/16 0020 01/22/16 0832 01/22/16 1230 01/22/16 1500  CKTOTAL 352*  --   --   --   CKMB 12.1*  --   --   --   TROPONINI 0.79* 1.36* 1.52* 1.53*    Recent Labs  01/20/16 1545  TROPIPOC 0.02   No results found for: BNP Lab Results  Component Value Date   CHOL 127 01/09/2012   HDL 39.20 01/09/2012   LDLCALC 66 01/09/2012   TRIG 107.0 01/09/2012   No results found for: DDIMER LIPASE  Date/Time Value Ref Range Status  01/20/2016 03:38 PM 26 11 - 51 U/L Final   TSH  Date/Time Value Ref Range Status  01/09/2012 09:34 AM 1.84 0.35 - 5.50 uIU/mL Final   RETIC CT PCT  Date/Time Value Ref Range Status  03/12/2007 10:00 AM 1.7  Final    Echo: 06/25 - Left ventricle: Septal , apical mid and distal inferior wall and  distal anterior wall hypokinesis The cavity size was moderately  dilated. There was mild focal basal hypertrophy of the septum.  Systolic function was severely reduced. The estimated ejection  fraction was in the range of 25% to 30%. Left ventricular  diastolic function parameters were normal. - Aortic valve: There was mild regurgitation. - Mitral valve: There was mild regurgitation. - Left atrium: The atrium was mildly dilated. - Atrial septum: No defect or patent foramen ovale was identified.  ECG:  06/24 SR, PVCs, no acute ischemic changes  Cath: 02/2007 CONCLUSION: 1. Coronary artery disease with 40% narrowing in the proximal LAD, 80%  stenosis in the proximal circumflex artery, 80% proximal-to-mid  stenosis in the right coronary artery, with normal LV function. 2. Right iliac aneurysm. 3. Successful PCI of the tandem lesions in the proximal and proximal  to mid-right coronary using three overlapping Promus stents (the  second stent for a guiding catheter dissection and a third stent  for a probable edge dissection), with improvement of stented area  of  narrowing from 80% to 0%. 2.5 x 12 mm Promus DES, 3 balloons CONCLUSION: Successful PCI of the lesion in the proximal circumflex artery using a Promus drug-eluting stent with improvement in narrowing from 90% to 0%.   Radiology:  Dg Chest Port 1 View 01/22/2016  CLINICAL DATA:  Sepsis EXAM: PORTABLE CHEST 1 VIEW COMPARISON:  Portable exam 0502 hours compared 01/21/2016 FINDINGS: RIGHT jugular central venous catheter tip projecting over SVC. Minimal enlargement of cardiac silhouette. Calcified tortuous aorta. Mediastinal contours and pulmonary vascularity otherwise normal. Persistent infiltrates in mid to lower lungs bilaterally greater on RIGHT. Question underlying emphysematous changes. No gross pleural effusion or pneumothorax. IMPRESSION: Persistent pulmonary infiltrates. Aortic atherosclerosis. Electronically Signed   By: Lavonia Dana M.D.   On: 01/22/2016 08:41   Dg Chest Port 1 View 01/21/2016  CLINICAL DATA:  75 year old female with a history of hypoxia EXAM: PORTABLE CHEST 1 VIEW COMPARISON:  01/20/2016 FINDINGS: Cardiomediastinal silhouette unchanged, though partially obscured by overlying lung and pleural disease. Worsening interstitial and airspace opacities. Worsening interlobular  septal thickening. Low lung volumes. Unchanged right IJ approach central venous catheter which appears to terminate superior vena cava. Calcifications of the aortic arch. No displaced fracture. IMPRESSION: Worsening interstitial and airspace opacities, which is most compatible with worsening edema and likely small pleural effusions. Underlying infection not excluded. Aortic atherosclerosis. Unchanged right IJ central catheter. Signed, Dulcy Fanny. Earleen Newport, DO [facility] Electronically Signed   By: Corrie Mckusick D.O.   On: 01/21/2016 21:31    ASSESSMENT AND PLAN:   The patient was seen today by Dr Burt Knack, the patient evaluated and the data reviewed.  1.   Elevated troponin - in the setting of severe sepsis - hopefully demand ischemia  - hx CAD - agree with heparin, no BB 2nd hypotension, no statin 2nd abnl LFTs  2.  Abnormal echocardiogram - EF now 25% w/ WMA, ?Takotsubo - follow for now, repeat echo prior to d/c - if EF improved, continue to follow, if no change, may need ischemic eval.  Otherwise, per CCM Principal Problem:   Septic shock ([place]) Active Problems:   Pressure ulcer  Signed: Lenoard Aden 01/23/2016 9:31 AM Murlean Hark [phone]  Co-Sign MD  Patient seen, examined. Available data reviewed. Agree with findings, assessment, and plan as outlined by Rosaria Ferries, PA-C. Pt with multiple surgeries after undergoing TKA in March of this year - has been immobile since that time. Exam reveals pleasant but very confused woman in NAD. Lungs CTA, heart RRR without murmur, abdomen soft, NT, extremities without edema on the right, and the left leg is wrapped in an ACE bandage with a brace on the knee. EKG shows NSR with nonspecific ST-T changes. Troponin and other labs reviewed. She has no symptoms of ACS and I suspect her echo findings are related to stress-cardiomyopathy associated with acute sepsis. Will repeat a limited echo in about 48 hours to see if improvement in LV function (usually see at least modest improvement in a short period of time with this clinical scenario). If no change will consider further ischemic work-up at that time. Continue IV heparin today, will likely convert to prophylactic dose heparin tomorrow.   Sherren Mocha, M.D. 01/23/2016 9:53 AM

## 2016-01-24 DIAGNOSIS — A419 Sepsis, unspecified organism: Secondary | ICD-10-CM | POA: Insufficient documentation

## 2016-01-24 DIAGNOSIS — I5021 Acute systolic (congestive) heart failure: Secondary | ICD-10-CM

## 2016-01-24 LAB — CBC WITH DIFFERENTIAL/PLATELET
BASOS ABS: 0 10*3/uL (ref 0.0–0.1)
Basophils Relative: 0 %
EOS PCT: 0 %
Eosinophils Absolute: 0 10*3/uL (ref 0.0–0.7)
HEMATOCRIT: 25.5 % — AB (ref 36.0–46.0)
HEMOGLOBIN: 8.2 g/dL — AB (ref 12.0–15.0)
LYMPHS ABS: 1.7 10*3/uL (ref 0.7–4.0)
Lymphocytes Relative: 5 %
MCH: 26.4 pg (ref 26.0–34.0)
MCHC: 32.2 g/dL (ref 30.0–36.0)
MCV: 82 fL (ref 78.0–100.0)
MONO ABS: 0.7 10*3/uL (ref 0.1–1.0)
Monocytes Relative: 2 %
Neutro Abs: 32.4 10*3/uL — ABNORMAL HIGH (ref 1.7–7.7)
Neutrophils Relative %: 93 %
Platelets: 77 10*3/uL — ABNORMAL LOW (ref 150–400)
RBC: 3.11 MIL/uL — AB (ref 3.87–5.11)
RDW: 16.6 % — ABNORMAL HIGH (ref 11.5–15.5)
WBC Morphology: INCREASED
WBC: 34.8 10*3/uL — AB (ref 4.0–10.5)

## 2016-01-24 LAB — COMPREHENSIVE METABOLIC PANEL
ALK PHOS: 212 U/L — AB (ref 38–126)
ALT: 472 U/L — AB (ref 14–54)
AST: 164 U/L — AB (ref 15–41)
Albumin: 1.7 g/dL — ABNORMAL LOW (ref 3.5–5.0)
Anion gap: 10 (ref 5–15)
BILIRUBIN TOTAL: 0.7 mg/dL (ref 0.3–1.2)
BUN: 29 mg/dL — AB (ref 6–20)
CALCIUM: 8.3 mg/dL — AB (ref 8.9–10.3)
CO2: 28 mmol/L (ref 22–32)
CREATININE: 0.95 mg/dL (ref 0.44–1.00)
Chloride: 103 mmol/L (ref 101–111)
GFR calc Af Amer: >60 mL/min (ref >60)
GFR, EST NON AFRICAN AMERICAN: 58 mL/min — AB (ref >60)
Glucose, Bld: 99 mg/dL (ref 65–99)
Potassium: 2.3 mmol/L — CL (ref 3.5–5.1)
Sodium: 141 mmol/L (ref 135–145)
TOTAL PROTEIN: 4.9 g/dL — AB (ref 6.5–8.1)

## 2016-01-24 LAB — GLUCOSE, CAPILLARY
GLUCOSE-CAPILLARY: 85 mg/dL (ref 65–99)
GLUCOSE-CAPILLARY: 81 mg/dL (ref 65–99)
GLUCOSE-CAPILLARY: 82 mg/dL (ref 65–99)
GLUCOSE-CAPILLARY: 93 mg/dL (ref 65–99)
Glucose-Capillary: 72 mg/dL (ref 65–99)
Glucose-Capillary: 94 mg/dL (ref 65–99)

## 2016-01-24 LAB — HEPARIN LEVEL (UNFRACTIONATED)
HEPARIN UNFRACTIONATED: 0.27 [IU]/mL — AB (ref 0.30–0.70)
HEPARIN UNFRACTIONATED: 0.43 [IU]/mL (ref 0.30–0.70)
Heparin Unfractionated: 0.48 IU/mL (ref 0.30–0.70)

## 2016-01-24 MED ORDER — HEPARIN (PORCINE) IN NACL 100-0.45 UNIT/ML-% IJ SOLN
1200.0000 [IU]/h | INTRAMUSCULAR | Status: DC
  Administered 2016-01-24 – 2016-01-26 (×3): 1200 [IU]/h via INTRAVENOUS
  Filled 2016-01-24 (×3): qty 250

## 2016-01-24 MED ORDER — CARVEDILOL 3.125 MG PO TABS
3.1250 mg | ORAL_TABLET | Freq: Two times a day (BID) | ORAL | Status: DC
  Administered 2016-01-24 – 2016-02-01 (×15): 3.125 mg via ORAL
  Filled 2016-01-24 (×17): qty 1

## 2016-01-24 MED ORDER — POTASSIUM CHLORIDE 10 MEQ/100ML IV SOLN
10.0000 meq | INTRAVENOUS | Status: AC
  Administered 2016-01-24 (×4): 10 meq via INTRAVENOUS
  Filled 2016-01-24 (×4): qty 100

## 2016-01-24 MED ORDER — ADULT MULTIVITAMIN W/MINERALS CH
1.0000 | ORAL_TABLET | Freq: Every day | ORAL | Status: DC
  Administered 2016-01-24 – 2016-02-01 (×9): 1 via ORAL
  Filled 2016-01-24 (×9): qty 1

## 2016-01-24 MED ORDER — POTASSIUM CHLORIDE CRYS ER 20 MEQ PO TBCR
40.0000 meq | EXTENDED_RELEASE_TABLET | ORAL | Status: AC
  Administered 2016-01-24 (×3): 40 meq via ORAL
  Filled 2016-01-24 (×3): qty 2

## 2016-01-24 MED ORDER — SODIUM CHLORIDE 0.9 % IV SOLN
1.0000 g | Freq: Four times a day (QID) | INTRAVENOUS | Status: DC
  Administered 2016-01-24 – 2016-01-26 (×7): 1 g via INTRAVENOUS
  Filled 2016-01-24 (×9): qty 1000

## 2016-01-24 MED ORDER — DOCUSATE SODIUM 100 MG PO CAPS: 100.0000 mg | ORAL_CAPSULE | Freq: Two times a day (BID) | ORAL | Status: DC | PRN

## 2016-01-24 MED ORDER — ENSURE ENLIVE PO LIQD
237.0000 mL | Freq: Three times a day (TID) | ORAL | Status: DC
  Administered 2016-01-24 – 2016-02-01 (×21): 237 mL via ORAL

## 2016-01-24 MED ORDER — WHITE PETROLATUM GEL
Status: AC
  Administered 2016-01-24: 1
  Filled 2016-01-24: qty 1

## 2016-01-24 NOTE — Progress Notes (Signed)
    Subjective:  Remains confused. No chest pain or shortness of breath. Daughter-in-law at bedside.  Objective:  Vital Signs in the last 24 hours: Temp:  [97.6 F (36.4 C)-98.3 F (36.8 C)] 98.3 F (36.8 C) (06/27 0552) Pulse Rate:  [59-89] 87 (06/27 0837) Resp:  [18-20] 18 (06/27 0837) BP: (111-131)/(57-74) 111/71 mmHg (06/27 0552) SpO2:  [91 %-95 %] 95 % (06/27 0837) Weight:  [119 lb 0.8 oz (54 kg)] 119 lb 0.8 oz (54 kg) (06/27 0552)  Intake/Output from previous day: 06/26 0701 - 06/27 0700 In: 496 [P.O.:240; I.V.:106; IV Piggyback:150] Out: 7672 [Urine:3325]  Physical Exam: Pt is alert and pleasant, confused, NAD HEENT: normal Neck: JVP - mildly elevated Lungs: CTA bilaterally CV: RRR without murmur or gallop Abd: soft, NT, Positive BS, no hepatomegaly Ext: Left leg dressing in place, no right leg edema Skin: warm/dry no rash   Lab Results:  Recent Labs  01/23/16 0400 01/24/16 0342  WBC 53.3* 34.8*  HGB 8.1* 8.2*  PLT 89* 77*    Recent Labs  01/23/16 0400 01/24/16 0342  NA 140 141  K 3.1* 2.3*  CL 105 103  CO2 27 28  GLUCOSE 83 99  BUN 33* 29*  CREATININE 1.07* 0.95    Recent Labs  01/22/16 1230 01/22/16 1500  TROPONINI 1.52* 1.53*    Tele: Sinus rhythm with ventricular bigeminy  Assessment/Plan:  1. Acute systolic heart failure 2. Elevated troponin, consider demand ischemia is most likely etiology 3. Severe sepsis  Plan as outlined in yesterday's consultation note. The patient's blood pressure is improved today. Will add low-dose carvedilol. Plan to repeat an echocardiogram on Thursday to assess for interval improvement in LV function as might be expected with stress-induced cardiomyopathy. If LVEF remains severely depressed, may need to consider further ischemic evaluation depending on her overall clinical course.   Sherren Mocha, M.D. 01/24/2016, 12:13 PM

## 2016-01-24 NOTE — Consult Note (Signed)
ORTHOPAEDIC CONSULTATION  REQUESTING PHYSICIAN: Kelvin Cellar, MD  Chief Complaint: Urosepsis status post left total knee arthroplasty status post muscle transfer and skin grafting   HPI: [name] is a 75 y.o. female who presents with urosepsis. Patient has severe peripheral vascular disease in her left lower extremity status post total knee arthroplasty as well as rotation muscle flap and skin graft who is seen for evaluation for possible involvement of her left knee.  Past Medical History  Diagnosis Date  . CAD (coronary artery disease)   . HTN (hypertension)   . Hyperlipidemia   . Cerebrovascular disease   . Arthritis   . PVD (peripheral vascular disease) (HCC)     dvt in leg  . MRSA (methicillin resistant staph aureus) culture positive   . Wears glasses   . Heart murmur   . History of bronchitis   . Memory impairment   . Complication of anesthesia     Three days after procedure pt statrted to suffer with memoy loss that took about 4-5 days to come back according to [name]."   Past Surgical History  Procedure Laterality Date  . Knee surgery      denies  . Abdominal hysterectomy    . Total hip arthroplasty Right 09/10/2012    Dr Sharol Given  . Total hip arthroplasty Right 09/10/2012    Procedure: TOTAL HIP ARTHROPLASTY;  Surgeon: Newt Minion, MD;  Location: [place];  Service: Orthopedics;  Laterality: Right;  Right Total Hip Arthroplasty  . Coronary angioplasty  07    3 stents placed  . Total knee arthroplasty Left 09/28/2015    Procedure: TOTAL KNEE ARTHROPLASTY;  Surgeon: Newt Minion, MD;  Location: [place];  Service: Orthopedics;  Laterality: Left;  . I&d knee with poly exchange Left 10/12/2015    Procedure: Irrigation and Debridement , Poly Exchange, Antibiotic Beads Left Knee;  Surgeon: Newt Minion, MD;  Location: [place];  Service: Orthopedics;  Laterality: Left;  . I&d extremity Left 10/18/2015    Procedure: Left Knee Washout, Reclosure;  Surgeon: Newt Minion, MD;  Location: [place];  Service: Orthopedics;  Laterality: Left;  [name] Eye surgery Bilateral     cataracts  . Colonoscopy    . Esophagogastroduodenoscopy    . Skin split graft Left 11/16/2015    Procedure: LEFT GASTROC MUSCLE FLAP WITH SKIN GRAFT SPLIT THICKNESS AND VAC PLACEMENT;  Surgeon: Loel Lofty [name], DO;  Location: [place];  Service: Plastics;  Laterality: Left;  . Application of wound vac Left 11/16/2015    Procedure: APPLICATION OF WOUND VAC;  Surgeon: Loel Lofty [name], DO;  Location: [place];  Service: Plastics;  Laterality: Left;  . Free flap to extremity Left 11/16/2015    Procedure: FREE FLAP TO EXTREMITY;  Surgeon: Loel Lofty [name], DO;  Location: [place];  Service: Plastics;  Laterality: Left;  . I&d extremity Left 12/08/2015    Procedure: IRRIGATION AND DEBRIDEMENT knee;  Surgeon: Loel Lofty [name], DO;  Location: [place];  Service: Plastics;  Laterality: Left;  . Application of a-cell of extremity Left 12/08/2015    Procedure: APPLICATION OF A-CELL OF knee;  Surgeon: Loel Lofty [name], DO;  Location: [place];  Service: Plastics;  Laterality: Left;  . Application of wound vac Left 12/08/2015    Procedure: APPLICATION OF WOUND VAC  AND CAST to knee;  Surgeon: Loel Lofty [name], DO;  Location: [place];  Service: Plastics;  Laterality: Left;  . Incision  and drainage of wound Left 01/04/2016    Procedure: DEBRIDEMENT OF LEFT KNEE WOUND WITH ACELL AND WOUND VAC PLACEMENT;  Surgeon: Loel Lofty [name], DO;  Location: [place];  Service: Plastics;  Laterality: Left;   Social History   Social History  . Marital Status: Single    Spouse Name: N/A  . Number of Children: N/A  . Years of Education: N/A   Occupational History  . Retired    Social History Main Topics  . Smoking status: Former Smoker -- 0.50 packs/day for 50 years    Types: Cigarettes    Quit date: 09/28/2015  . Smokeless tobacco: Never Used     Comment: quit 09/27/15  . Alcohol Use: No  . Drug Use: No  . Sexual  Activity: No   Other Topics Concern  . None   Social History Narrative   She has been living with her daughter when not in rehab since the surgery in March.   Family History  Problem Relation Age of Onset  . Breast cancer Mother   . Leukemia Father   . Stroke Sister   . Bone cancer     - negative except otherwise stated in the family history section No Known Allergies Prior to Admission medications   Medication Sig Start Date End Date Taking? Authorizing Provider  amLODipine (NORVASC) 10 MG tablet TAKE 1 TABLET (10 MG TOTAL) BY MOUTH DAILY. Patient taking differently: Take 10 mg by mouth daily.  08/27/14  Yes Lelon Perla, MD  aspirin EC 81 MG tablet Take 81 mg by mouth daily.   Yes Historical Provider, MD  atorvastatin (LIPITOR) 40 MG tablet TAKE ONE TABLET BY MOUTH DAILY. Patient taking differently: Take 40 mg by mouth daily.  09/15/15  Yes Lelon Perla, MD  budesonide-formoterol (SYMBICORT) 160-4.5 MCG/ACT inhaler Inhale 2 puffs into the lungs 2 (two) times daily.   Yes Historical Provider, MD  colchicine 0.6 MG tablet Take 0.6 mg by mouth daily as needed (gout flare).  08/30/15  Yes Historical Provider, MD  diclofenac (VOLTAREN) 75 MG EC tablet Take 75 mg by mouth 2 (two) times daily.  12/18/11  Yes Historical Provider, MD  doxycycline (VIBRA-TABS) 100 MG tablet Take 1 tablet (100 mg total) by mouth 2 (two) times daily. Patient taking differently: Take 100 mg by mouth 2 (two) times daily. Continuous course until  05/25/16 11/24/15 05/25/16 Yes Thayer Headings, MD  Glucosamine-Chondroit-Vit C-Mn (GLUCOSAMINE-CHONDROITIN) CAPS Take 1 capsule by mouth daily.   Yes Historical Provider, MD  hydrochlorothiazide (MICROZIDE) 12.5 MG capsule TAKE 1 CAPSULE (12.5 MG TOTAL) BY MOUTH EVERY MORNING. Patient taking differently: Take 12.5 mg by mouth daily.  08/27/14  Yes Lelon Perla, MD  lisinopril (PRINIVIL,ZESTRIL) 40 MG tablet TAKE 1 TABLET (40 MG TOTAL) BY MOUTH DAILY. Patient taking  differently: Take 40 mg by mouth daily.  08/27/14  Yes Lelon Perla, MD  Multiple Vitamin (MULITIVITAMIN WITH MINERALS) TABS Take 1 tablet by mouth daily.   Yes Historical Provider, MD  nitrofurantoin, macrocrystal-monohydrate, (MACROBID) 100 MG capsule Take 100 mg by mouth 2 (two) times daily.   Yes Historical Provider, MD  polyethylene glycol (MIRALAX / GLYCOLAX) packet Take 17 g by mouth daily. Patient taking differently: Take 17 g by mouth daily as needed for moderate constipation.  11/17/15  Yes Shawn Montgomery Rayburn, PA-C  traMADol (ULTRAM) 50 MG tablet Take 50 mg by mouth every 12 (twelve) hours as needed for moderate pain.    Yes Historical Provider,  MD  enoxaparin (LOVENOX) 30 MG/0.3ML injection Inject 0.3 mLs (30 mg total) into the skin daily. Patient taking differently: Inject 30 mg into the skin daily with supper.  11/17/15   Shawn Montgomery Rayburn, PA-C   No results found. - pertinent xrays, CT, MRI studies were reviewed and independently interpreted  Positive ROS: All other systems have been reviewed and were otherwise negative with the exception of those mentioned in the HPI and as above.  Physical Exam: General: Alert, no acute distress,Patient answers questions appropriately. Cardiovascular: No pedal edema Respiratory: No cyanosis, no use of accessory musculature GI: No organomegaly, abdomen is soft and non-tender Skin: Left knee has excellent granulation tissue with a wound bed that shows no cellulitis no drainage no signs of infection. Neurologic: Sensation intact distally Psychiatric: Patient is competent for consent with normal mood and affect Lymphatic: No axillary or cervical lymphadenopathy  MUSCULOSKELETAL:  On examination the left knee there is no effusion no cellulitis no tenderness to palpation the wound bed is granulating nicely with no signs of infection 100% beefy granulation tissue.  Assessment: Assessment: Urosepsis status post left total knee status  post muscle rotation flap skin graft and severe peripheral vascular disease on the left lower extremity.  Plan: Plan: Patient is at high risk for infecting her left knee due to her peripheral vascular disease. Would recommend at least 4 weeks of antibiotic coverage to ensure her left knee does not become infected. With an infection of the left total knee arthroplasty patient most likely would require an above-the-knee amputation.  Thank you for the consult and the opportunity to see Ms. Bettina Gavia, [place] [phone] 7:06 AM

## 2016-01-24 NOTE — Progress Notes (Signed)
Referring Physician(s): Preston Fleeting Dr Ellison Carwin  Supervising Physician: Arne Cleveland  Patient Status:  Inpatient  Chief Complaint:  Rt hydronephrosis Obstructive stone PCN placed 6/23  Subjective:  Better daily Still somewhat confused PCN output great  Cr wnl Wbc trending down  Allergies: Review of patient's allergies indicates no known allergies.  Medications: Prior to Admission medications   Medication Sig Start Date End Date Taking? Authorizing Provider  amLODipine (NORVASC) 10 MG tablet TAKE 1 TABLET (10 MG TOTAL) BY MOUTH DAILY. Patient taking differently: Take 10 mg by mouth daily.  08/27/14  Yes Lelon Perla, MD  aspirin EC 81 MG tablet Take 81 mg by mouth daily.   Yes Historical Provider, MD  atorvastatin (LIPITOR) 40 MG tablet TAKE ONE TABLET BY MOUTH DAILY. Patient taking differently: Take 40 mg by mouth daily.  09/15/15  Yes Lelon Perla, MD  budesonide-formoterol (SYMBICORT) 160-4.5 MCG/ACT inhaler Inhale 2 puffs into the lungs 2 (two) times daily.   Yes Historical Provider, MD  colchicine 0.6 MG tablet Take 0.6 mg by mouth daily as needed (gout flare).  08/30/15  Yes Historical Provider, MD  diclofenac (VOLTAREN) 75 MG EC tablet Take 75 mg by mouth 2 (two) times daily.  12/18/11  Yes Historical Provider, MD  doxycycline (VIBRA-TABS) 100 MG tablet Take 1 tablet (100 mg total) by mouth 2 (two) times daily. Patient taking differently: Take 100 mg by mouth 2 (two) times daily. Continuous course until  05/25/16 11/24/15 05/25/16 Yes Thayer Headings, MD  Glucosamine-Chondroit-Vit C-Mn (GLUCOSAMINE-CHONDROITIN) CAPS Take 1 capsule by mouth daily.   Yes Historical Provider, MD  hydrochlorothiazide (MICROZIDE) 12.5 MG capsule TAKE 1 CAPSULE (12.5 MG TOTAL) BY MOUTH EVERY MORNING. Patient taking differently: Take 12.5 mg by mouth daily.  08/27/14  Yes Lelon Perla, MD  lisinopril (PRINIVIL,ZESTRIL) 40 MG tablet TAKE 1 TABLET (40 MG TOTAL) BY MOUTH  DAILY. Patient taking differently: Take 40 mg by mouth daily.  08/27/14  Yes Lelon Perla, MD  Multiple Vitamin (MULITIVITAMIN WITH MINERALS) TABS Take 1 tablet by mouth daily.   Yes Historical Provider, MD  nitrofurantoin, macrocrystal-monohydrate, (MACROBID) 100 MG capsule Take 100 mg by mouth 2 (two) times daily.   Yes Historical Provider, MD  polyethylene glycol (MIRALAX / GLYCOLAX) packet Take 17 g by mouth daily. Patient taking differently: Take 17 g by mouth daily as needed for moderate constipation.  11/17/15  Yes Shawn Montgomery Rayburn, PA-C  traMADol (ULTRAM) 50 MG tablet Take 50 mg by mouth every 12 (twelve) hours as needed for moderate pain.    Yes Historical Provider, MD  enoxaparin (LOVENOX) 30 MG/0.3ML injection Inject 0.3 mLs (30 mg total) into the skin daily. Patient taking differently: Inject 30 mg into the skin daily with supper.  11/17/15   Shawn Montgomery Rayburn, PA-C     Vital Signs: BP 126/55 mmHg  Pulse 91  Temp(Src) 98.2 F (36.8 C) (Oral)  Resp 18  Ht '5\' 2"'$  (1.575 m)  Wt 119 lb 0.8 oz (54 kg)  BMI 21.77 kg/m2  SpO2 97%  Physical Exam  Abdominal: Soft. Bowel sounds are normal.  Neurological:  Confusion Communicating minimally  Skin: Skin is warm.  Site of R PCN clean and dry No bleeding PCN intact Output 1.6 L yesterday; 500 cc in bag- yellow   Psychiatric:  Family at bedside  Vitals reviewed.   Imaging: Ct Abdomen Pelvis W Contrast  01/20/2016  CLINICAL DATA:  Right lower quadrant abdominal pain for 5  days with nausea and vomiting. EXAM: CT ABDOMEN AND PELVIS WITH CONTRAST TECHNIQUE: Multidetector CT imaging of the abdomen and pelvis was performed using the standard protocol following bolus administration of intravenous contrast. CONTRAST:  180m ISOVUE-300 IOPAMIDOL (ISOVUE-300) INJECTION 61% COMPARISON:  08/06/2005 CT abdomen/ pelvis. 02/14/2007 abdominal sonogram. FINDINGS: Lower chest: Centrilobular emphysema. Right middle lobe 3 mm  pulmonary nodule (series 205/ image 6) is stable since 2007 and benign. Left main, left anterior descending, left circumflex and right coronary atherosclerosis. Hepatobiliary: Heterogeneous liver parenchymal attenuation, with geographic areas of low attenuation in the left liver lobe. No definite liver surface irregularity. No liver mass. Mild diffuse periportal edema. Numerous subcentimeter calcified gallstones layering in the nondistended gallbladder, with no definite gallbladder wall thickening or pericholecystic fluid. No biliary ductal dilatation. Pancreas: Normal, with no mass or duct dilation. Spleen: Normal size. No mass. Adrenals/Urinary Tract: Mild diffuse irregular thickening of both adrenal glands without discrete adrenal mass, not definitely changed back to 2007, suggesting adrenal hyperplasia . Probable 2 mm stone at the right ureterovesical junction, noting limited visualization of the pelvic segment of the right ureter due to streak artifact from the right hip hardware. Separate 3 mm stone in the mid right pelvic ureter, with mild right hydroureteronephrosis. Additional nonobstructing 4 mm interpolar and 5 mm lower right renal stones. Exophytic simple 3.2 cm renal cyst in the anterior mid to upper right kidney. No left hydronephrosis. Bladder poorly visualized due to streak artifact, with no gross bladder abnormality. Stomach/Bowel: Grossly normal stomach. Normal caliber small bowel with no small bowel wall thickening. Normal appendix. Mild sigmoid diverticulosis, with no large bowel wall thickening or pericolonic fat stranding. Prominent rectal stool with rectal diameter 8.0 cm. No definite rectal wall thickening. Vascular/Lymphatic: Atherosclerotic nonaneurysmal abdominal aorta. Insert vein No pathologically enlarged lymph nodes in the abdomen or pelvis. Reproductive: Status post hysterectomy, with no gross abnormal findings at the vaginal cuff. No adnexal mass. Other: No pneumoperitoneum, ascites  or focal fluid collection. Musculoskeletal: No aggressive appearing focal osseous lesions. Right total hip arthroplasty is partially visualized. Moderate degenerative changes in the visualized thoracolumbar spine. Moderate to marked compression fracture of the T11 vertebral body of indeterminate chronicity, which appears new since the lateral 09/20/2015 chest radiograph. IMPRESSION: 1. Obstructing 2 mm right UVJ stone with mild right hydroureteronephrosis. Separate 3 mm stone in the mid right pelvic ureter. Additional nonobstructing right renal stones. 2. Suggestion of fecal impaction in the rectum. No definite rectal wall thickening. 3. Moderate to severe T11 vertebral compression fracture of indeterminate chronicity, which appears new since 09/20/2015. 4. Nonspecific liver parenchymal heterogeneity without discrete liver mass, favor hepatic steatosis, cannot exclude hepatic fibrosis. 5. Additional findings include left main and 3 vessel coronary atherosclerosis, centrilobular emphysema, mild sigmoid diverticulosis and cholelithiasis. Electronically Signed   By: JIlona SorrelM.D.   On: 01/20/2016 18:38   Dg Chest Port 1 View  01/22/2016  CLINICAL DATA:  Sepsis EXAM: PORTABLE CHEST 1 VIEW COMPARISON:  Portable exam 0502 hours compared 01/21/2016 FINDINGS: RIGHT jugular central venous catheter tip projecting over SVC. Minimal enlargement of cardiac silhouette. Calcified tortuous aorta. Mediastinal contours and pulmonary vascularity otherwise normal. Persistent infiltrates in mid to lower lungs bilaterally greater on RIGHT. Question underlying emphysematous changes. No gross pleural effusion or pneumothorax. IMPRESSION: Persistent pulmonary infiltrates. Aortic atherosclerosis. Electronically Signed   By: MLavonia DanaM.D.   On: 01/22/2016 08:41   Dg Chest Port 1 View  01/21/2016  CLINICAL DATA:  75year old female with a history of hypoxia  EXAM: PORTABLE CHEST 1 VIEW COMPARISON:  01/20/2016 FINDINGS:  Cardiomediastinal silhouette unchanged, though partially obscured by overlying lung and pleural disease. Worsening interstitial and airspace opacities. Worsening interlobular septal thickening. Low lung volumes. Unchanged right IJ approach central venous catheter which appears to terminate superior vena cava. Calcifications of the aortic arch. No displaced fracture. IMPRESSION: Worsening interstitial and airspace opacities, which is most compatible with worsening edema and likely small pleural effusions. Underlying infection not excluded. Aortic atherosclerosis. Unchanged right IJ central catheter. Signed, Dulcy Fanny. Earleen Newport, DO [facility] Radiology Electronically Signed   By: Corrie Mckusick D.O.   On: 01/21/2016 21:31   Dg Chest Portable 1 View  01/20/2016  CLINICAL DATA:  Central line placement EXAM: PORTABLE CHEST 1 VIEW COMPARISON:  6/23/ 17 FINDINGS: Cardiomediastinal silhouette is stable. Streaky interstitial prominence/ pneumonitis bilateral lower lobe and right upper lobe again noted. There is right IJ central line with tip in SVC. No pneumothorax. IMPRESSION: Right IJ central line in place.  No pneumothorax. Electronically Signed   By: Lahoma Crocker M.D.   On: 01/20/2016 20:51   Dg Chest Portable 1 View  01/20/2016  CLINICAL DATA:  Nausea and vomiting.  Pain. EXAM: PORTABLE CHEST 1 VIEW COMPARISON:  09/20/2015. FINDINGS: Mediastinum and hilar structures are normal. Mild bilateral upper lobe and bibasilar infiltrates. Small bilateral pleural effusions cannot be excluded. Heart size normal. Degenerative changes both shoulders. IMPRESSION: Mild bilateral upper lobe and bibasilar pulmonary infiltrates consistent with pneumonia. Electronically Signed   By: Marcello Moores  Register   On: 01/20/2016 17:00   Ir Nephrostomy Placement Right  01/21/2016  CLINICAL DATA:  Right ureteral obstruction from calculus causing hydronephrosis and acute sepsis. The patient requires  emergent percutaneous nephrostomy tube to decompress the right kidney. EXAM: 1. ULTRASOUND GUIDANCE FOR PUNCTURE OF THE RIGHT RENAL COLLECTING SYSTEM. 2. RIGHT PERCUTANEOUS NEPHROSTOMY TUBE PLACEMENT. COMPARISON:  CT of the abdomen and pelvis earlier today on 01/20/2016. ANESTHESIA/SEDATION: 1.0 mg IV Versed; 25 mcg IV Fentanyl. Total Moderate Sedation Time 15 minutes The patient's level of consciousness and physiologic status were continuously monitored during the procedure by Radiology nursing. CONTRAST:  20 ml Omnipaque 300 MEDICATIONS: No additional medications. Scheduled IV Fortaz and vancomycin were started just prior to the procedure. FLUOROSCOPY TIME:  1 minutes and 6 seconds. PROCEDURE: The procedure, risks, benefits, and alternatives were explained to the patient's son. Questions regarding the procedure were encouraged and answered. The patient's son understands and consents to the procedure. The right flank region was prepped with chlorhexidine in a sterile fashion, and a sterile drape was applied covering the operative field. A sterile gown and sterile gloves were used for the procedure. Local anesthesia was provided with 1% Lidocaine. Ultrasound was used to localize the right kidney. Under direct ultrasound guidance, a 21 gauge needle was advanced into the renal collecting system. Ultrasound image documentation was performed. Aspiration of urine sample was performed followed by contrast injection. A transitional dilator was advanced over a guidewire. Percutaneous tract dilatation was then performed over the guidewire. A 10 -French percutaneous nephrostomy tube was then advanced and formed in the collecting system. Catheter position was confirmed by fluoroscopy after contrast injection. The catheter was secured at the skin with a Prolene retention suture and Stat-Lock device. A gravity bag was placed. COMPLICATIONS: None. FINDINGS: Ultrasound demonstrates moderate right hydronephrosis. After access of  the collecting system, there was return of blood tinged urine. A sample was sent for culture analysis. The 10 French nephrostomy tube was  formed in the renal pelvis. IMPRESSION: Right percutaneous nephrostomy tube placement for acute decompression of the right kidney. A 10 French catheter was placed and formed in the renal pelvis. This tube was attached to a gravity drainage bag. A sample of urine was sent for culture analysis. Electronically Signed   By: Aletta Edouard M.D.   On: 01/21/2016 09:12    Labs:  CBC:  Recent Labs  01/21/16 1159 01/22/16 0404 01/22/16 1657 01/23/16 0400 01/24/16 0342  WBC 57.1* 50.7*  --  53.3* 34.8*  HGB 9.3* 8.3* 8.5* 8.1* 8.2*  HCT 29.8* 26.5* 26.8* 25.5* 25.5*  PLT 179 134*  --  89* 77*    COAGS:  Recent Labs  09/20/15 1558 01/04/16 1204 01/22/16 0020  INR 1.11 1.15 2.03*  APTT 37  --  47*    BMP:  Recent Labs  01/22/16 0400 01/22/16 1500 01/23/16 0400 01/24/16 0342  NA 134* 137 140 141  K 3.6 3.5 3.1* 2.3*  CL 103 105 105 103  CO2 '22 24 27 28  '$ GLUCOSE 161* 92 83 99  BUN 26* 30* 33* 29*  CALCIUM 8.0* 7.7* 7.9* 8.3*  CREATININE 1.10* 0.97 1.07* 0.95  GFRNONAA 48* 56* 50* 58*  GFRAA 56* >60 58* >60    LIVER FUNCTION TESTS:  Recent Labs  09/20/15 1558 01/20/16 1538 01/24/16 0342  BILITOT 0.3 1.0 0.7  AST 22 115* 164*  ALT 18 68* 472*  ALKPHOS 115 255* 212*  PROT 7.4 6.9 4.9*  ALBUMIN 2.8* 2.6* 1.7*    Assessment and Plan:  R Percutaneous nephrostomy in place Good output Wbc trending down Will follow Plan per Urology  Electronically Signed: Monia Sabal A 01/24/2016, 2:46 PM   I spent a total of 15 Minutes at the the patient's bedside AND on the patient's hospital floor or unit, greater than 50% of which was counseling/coordinating care for R PCN

## 2016-01-24 NOTE — Progress Notes (Signed)
ANTICOAGULATION CONSULT NOTE - Follow Up Consult  Pharmacy Consult for heparin Indication: chest pain/ACS  No Known Allergies  Patient Measurements: Height: '5\' 2"'$  (157.5 cm) Weight: 119 lb 0.8 oz (54 kg) IBW/kg (Calculated) : 50.1 Heparin Dosing Weight: 51.9 kg  Vital Signs: Temp: 99.1 F (37.3 C) (06/27 2112) Temp Source: Oral (06/27 1308) BP: 131/76 mmHg (06/27 2112) Pulse Rate: 94 (06/27 2112)  Labs:  Recent Labs  01/22/16 0020  01/22/16 0404  01/22/16 0832 01/22/16 1230 01/22/16 1500 01/22/16 1657 01/23/16 0400  01/24/16 0342 01/24/16 1159 01/24/16 2030  HGB  --   < > 8.3*  --   --   --   --  8.5* 8.1*  --  8.2*  --   --   HCT  --   < > 26.5*  --   --   --   --  26.8* 25.5*  --  25.5*  --   --   PLT  --   --  134*  --   --   --   --   --  89*  --  77*  --   --   APTT 47*  --   --   --   --   --   --   --   --   --   --   --   --   LABPROT 22.8*  --   --   --   --   --   --   --   --   --   --   --   --   INR 2.03*  --   --   --   --   --   --   --   --   --   --   --   --   HEPARINUNFRC  --   --   --   < > 0.21*  --   --  0.35 0.24*  < > 0.27* 0.48 0.43  CREATININE  --   < >  --   --   --   --  0.97  --  1.07*  --  0.95  --   --   CKTOTAL 352*  --   --   --   --   --   --   --   --   --   --   --   --   CKMB 12.1*  --   --   --   --   --   --   --   --   --   --   --   --   TROPONINI 0.79*  --   --   --  1.36* 1.52* 1.53*  --   --   --   --   --   --   < > = values in this interval not displayed.  Estimated Creatinine Clearance: 41.1 mL/min (by C-G formula based on Cr of 0.95).   Medications:  Scheduled:  . ampicillin (OMNIPEN) IV  1 g Intravenous Q6H  . antiseptic oral rinse  7 mL Mouth Rinse q12n4p  . carvedilol  3.125 mg Oral BID WC  . chlorhexidine  15 mL Mouth Rinse BID  . feeding supplement (ENSURE ENLIVE)  237 mL Oral TID BM  . insulin aspart  0-9 Units Subcutaneous Q4H  . multivitamin with minerals  1 tablet Oral Daily   Infusions:  . heparin  1,200 Units/hr (01/24/16 4696)  Assessment: 75 yo female with ACS is currently on therapeutic heparin.  Heparin level is 0.43.  Goal of Therapy:  Heparin level 0.3-0.7 units/ml Monitor platelets by anticoagulation protocol: Yes   Plan:  - continue heparin at 1200 units/hr - daily heparin level and CBC  Kyen Taite, Tsz-Yin 01/24/2016,9:24 PM

## 2016-01-24 NOTE — Progress Notes (Signed)
ANTICOAGULATION CONSULT NOTE - Follow Up Consult  Pharmacy Consult for heparin Indication: chest pain/ACS  No Known Allergies  Patient Measurements: Height: '5\' 2"'$  (157.5 cm) Weight: 119 lb 0.8 oz (54 kg) IBW/kg (Calculated) : 50.1 Heparin Dosing Weight: 51.9 kg  Vital Signs: Temp: 98.3 F (36.8 C) (06/27 0552) BP: 111/71 mmHg (06/27 0552) Pulse Rate: 87 (06/27 0837)  Labs:  Recent Labs  01/22/16 0020  01/22/16 0404  01/22/16 0832 01/22/16 1230 01/22/16 1500 01/22/16 1657 01/23/16 0400 01/23/16 1827 01/24/16 0342 01/24/16 1159  HGB  --   < > 8.3*  --   --   --   --  8.5* 8.1*  --  8.2*  --   HCT  --   < > 26.5*  --   --   --   --  26.8* 25.5*  --  25.5*  --   PLT  --   --  134*  --   --   --   --   --  89*  --  77*  --   APTT 47*  --   --   --   --   --   --   --   --   --   --   --   LABPROT 22.8*  --   --   --   --   --   --   --   --   --   --   --   INR 2.03*  --   --   --   --   --   --   --   --   --   --   --   HEPARINUNFRC  --   --   --   < > 0.21*  --   --  0.35 0.24* 0.21* 0.27* 0.48  CREATININE  --   < >  --   --   --   --  0.97  --  1.07*  --  0.95  --   CKTOTAL 352*  --   --   --   --   --   --   --   --   --   --   --   CKMB 12.1*  --   --   --   --   --   --   --   --   --   --   --   TROPONINI 0.79*  --   --   --  1.36* 1.52* 1.53*  --   --   --   --   --   < > = values in this interval not displayed.  Estimated Creatinine Clearance: 41.1 mL/min (by C-G formula based on Cr of 0.95).  Assessment: 75 yo female with elevated troponins possibly from demand ischemia. Cardiology is planning to reassess LVEF on Thursday to assess for stress-induced cardiomyopathy.  Remains on IV heparin for now. Level this afternoon is therapeutic at 0.48 units/mL on a rate of 1200 units/hr.  Hgb 8.2, plts 77- stable, no overt bleeding noted. Platelet drop possibly from sepsis/acute illness. Cont to follow.  Goal of Therapy:  Heparin level 0.3-0.7 units/ml Monitor  platelets by anticoagulation protocol: Yes   Plan:  -heparin 1200 units/hr -confirmatory HL at 2000 -Daily HL, CBC  Chanay Nugent D. Kensly Bowmer, PharmD, BCPS Clinical Pharmacist Pager: [phone] 01/24/2016 1:12 PM

## 2016-01-24 NOTE — Progress Notes (Signed)
CRITICAL VALUE ALERT  Critical value received:  Potasium 2.3  Date of notification:  01/24/2016  Time of notification: 0618  Critical value read back: yes  Nurse who received alert:  Bing Plume  MD notified (1st page):  Critical care  Time of first page: * 0620  MD notified (2nd page):not yet  Time of second page: not yet  Responding MD:  Awaiting response  Time MD responded: still waiting for call back

## 2016-01-24 NOTE — Care Management Note (Signed)
Case Management Note  Patient Details  Name: MINDEL FRISCIA MRN: [id number] Date of Birth: 1941/03/19  Subjective/Objective:                 Spoke with patient's daughter in law at the bedside. She states that in March when her MIL had her initial knee surgery she went to [facility] and then was discharged and followed by Arville Go for [facility] RN PT OT. At that time she has a wound vac that is since gone. She has a bedside commode, walker and a WC with a leg extender at home. Patient currently admitted with bacteremia. Patient is on 8L O2 via [place], she is not oxygen dependent. Her DIL states that she is open to SNF at time of DC if it will be covered by insurance. CM updated CSW. PT eval pending.    Action/Plan:  If DC to home, may need home O2, would like to continue with [place].  Expected Discharge Date:                  Expected Discharge Plan:  Skilled Nursing Facility  In-House Referral:  Clinical Social Work  Discharge planning Services  CM Consult  Post Acute Care Choice:    Choice offered to:     DME Arranged:    DME Agency:     HH Arranged:    [place] Agency:     Status of Service:  In process, will continue to follow  If discussed at Long Length of Stay Meetings, dates discussed:    Additional Comments:  Carles Collet, RN 01/24/2016, 2:17 PM

## 2016-01-24 NOTE — Progress Notes (Signed)
Pt potassium level 2.3 i called critical care and Elink twice and yet no doctor has called me back,  Scheduled iv lasix not given due to the potassium  Level waiting for the on call doctor to call me if he/ she wants me to give the lasix, i will continue to monitor pt

## 2016-01-24 NOTE — Progress Notes (Signed)
Initial Nutrition Assessment  DOCUMENTATION CODES:   Not applicable  INTERVENTION:   -Ensure Enlive po TID, each supplement provides 350 kcal and 20 grams of protein -MVI daily  NUTRITION DIAGNOSIS:   Inadequate oral intake related to poor appetite as evidenced by meal completion < 25%, percent weight loss, per patient/family report.  GOAL:   Patient will meet greater than or equal to 90% of their needs  MONITOR:   PO intake, Supplement acceptance, Labs, Weight trends, Skin, I & O's  REASON FOR ASSESSMENT:   Malnutrition Screening Tool    ASSESSMENT:   [name] is a 75 y.o. female who presented to the emergency room here at [facility] earlier today with a two-week history of crampy abdominal pain, frequency, urgency, dysuria.  Pt admitted with sepsis.   Pt underwent rt percutaneous nephrostomy rube placement on 01/20/16 for due to rt hydronephrosis.   Pt sleeping soundly at time of visit. Pt daughter-in-law at bedside requested this RD not disturb pt. Nutrition-focused physical exam deferred at this time.   Per pt daughter-in-law, pt has experienced a general decline in health over the past 3-4 months. Daughter-in-law reports pt has had a poor appetite during this time period. She generally consumes 3 very small meal daily and supplements with 3 Boost High Protein supplements daily. She is very concerned about pt's lack on intake; reviewed lunch tray with daughter and pt consumed about 25% of chicken breast. Daughter-in-law hopeful that pt's intake will improve now that she is receiving solid foods.   Daughter-in-law estimated pt has lost about 40# over the past 6 months, which is no consistent with wt hx. Per wt hx, pt has experienced a 19# (13.7%) wt loss over the past 6 months, which is significant for time frame.   Discussed importance of good nutrition to support healing, especially with presence of wounds and surgical incisions. Daughter-in-law very receptive  to RD recommendations and reports lt knee incision continues to heal well. Amenable to Ensure supplements, due to increased nutrient density.   RD suspects malnutrition, however, unable to identify at this time.  Labs reviewed: K: 2.3 (on PO supplementation).   Diet Order:  DIET SOFT Room service appropriate?: Yes; Fluid consistency:: Thin  Skin:  Wound (see comment) (DTPI lt heel, st II rt upper lumbar, closed lt knee incision)  Last BM:  01/24/16  Height:   Ht Readings from Last 1 Encounters:  01/23/16 '5\' 2"'$  (1.575 m)    Weight:   Wt Readings from Last 1 Encounters:  01/24/16 119 lb 0.8 oz (54 kg)    Ideal Body Weight:  50 kg  BMI:  Body mass index is 21.77 kg/(m^2).  Estimated Nutritional Needs:   Kcal:  1600-1800  Protein:  80-95 grams  Fluid:  >1.6 L  EDUCATION NEEDS:   Education needs addressed  Keeli Roberg A. Jimmye Norman, RD, LDN, CDE Pager: [phone] After hours Pager: [phone]

## 2016-01-24 NOTE — Progress Notes (Signed)
ANTICOAGULATION CONSULT NOTE - Follow Up Consult  Pharmacy Consult for Heparin  Indication: chest pain/ACS  No Known Allergies  Patient Measurements: Height: '5\' 2"'$  (157.5 cm) Weight: 126 lb 12.2 oz (57.5 kg) IBW/kg (Calculated) : 50.1  Vital Signs: Temp: 97.6 F (36.4 C) (06/26 2117) BP: 131/57 mmHg (06/26 2117) Pulse Rate: 89 (06/26 2117)  Labs:  Recent Labs  01/22/16 0020 01/22/16 0400 01/22/16 0404  01/22/16 0832 01/22/16 1230 01/22/16 1500 01/22/16 1657 01/23/16 0400 01/23/16 1827 01/24/16 0342  HGB  --   --  8.3*  --   --   --   --  8.5* 8.1*  --  8.2*  HCT  --   --  26.5*  --   --   --   --  26.8* 25.5*  --  25.5*  PLT  --   --  134*  --   --   --   --   --  89*  --  77*  APTT 47*  --   --   --   --   --   --   --   --   --   --   LABPROT 22.8*  --   --   --   --   --   --   --   --   --   --   INR 2.03*  --   --   --   --   --   --   --   --   --   --   HEPARINUNFRC  --   --   --   < > 0.21*  --   --  0.35 0.24* 0.21* 0.27*  CREATININE  --  1.10*  --   --   --   --  0.97  --  1.07*  --   --   CKTOTAL 352*  --   --   --   --   --   --   --   --   --   --   CKMB 12.1*  --   --   --   --   --   --   --   --   --   --   TROPONINI 0.79*  --   --   --  1.36* 1.52* 1.53*  --   --   --   --   < > = values in this interval not displayed.  Estimated Creatinine Clearance: 36.5 mL/min (by C-G formula based on Cr of 1.07).    Assessment: Heparin level is trending up but remains sub-therapeutic, no issues per RN.   Goal of Therapy:  Heparin level 0.3-0.7 units/ml Monitor platelets by anticoagulation protocol: Yes   Plan:  -Inc heparin drip to 1200 units/hr -1200 HL  Narda Bonds 01/24/2016,4:34 AM

## 2016-01-24 NOTE — Progress Notes (Signed)
PROGRESS NOTE    [name]  OIZ:[id number] DOB: 1940/09/11 DOA: 01/20/2016 PCP: Kirk Ruths, MD   Brief Narrative:  [name] is a 75 year old female with a past medical history of a total left knee arthroplasty seizure performed on 09/28/2015 by Dr. Sharol Given of orthopedic surgery, was complicated by infection undergoing removal of hardware and placement of antibiotic beads on 10/12/2015, procedure performed by Dr. Sharol Given. Unfortunately she had dehiscence of left knee arthroplasty undergoing irrigation and debridement on 10/18/2015. She was admitted to the pulmonary critical care service on 01/20/2016 presenting with abdominal pain, dysuria, nausea, vomiting, found have a temperature of 103, hypotensive, and required pressor support. She was further worked up with CT scan of abdomen and pelvis that revealed obstructing UVJ stone with associated hydroureteronephrosis. Urology was consulted, she underwent nephrostomy tube placement. Urine cultures and blood cultures both growing Proteus mirabilis, with repeat blood cultures performed on 01/23/2016 showing no growth to date. She is on ceftriaxone 2 g IV every 24 hours. During this hospitalization she was seen by her orthopedic surgeon Dr Sharol Given who recommended at least 4 weeks of antibiotic coverage. On 01/24/2016 her care was transferred from [facility] to medicine.    Assessment & Plan:   Principal Problem:   Septic shock ([place]) Active Problems:   Pressure ulcer   Elevated troponin   Abnormal echocardiogram   Hypoxia   1.  Septic shock -Courtney. Buschman is a 75 year old female newly she presented in shock likely secondary to urosepsis, requiring IV pressor support and ICU admission. -Source of infection likely pyelonephritis/UTI, as CT scan revealed evidence of obstructive uropathy. Radiology reported obstructing 2 mm right UVJ stone with associated hydronephrosis. -Both urine and blood cultures growing Proteus -She is now hemodynamically stable off  of pressor support and has been transferred to the medicine service. -Susceptibility testing showing organism is sensitive to beta lactams. -Plan to discontinue ceftriaxone and treat with ampicillin -Repeat blood cultures drawn on 01/23/2016 show no growth after overnight incubation. Would transition to oral antimicrobial therapy in the next 24 hours if repeat blood cultures remain negative.  2.  Obstructive uropathy. -She was admitted for urosepsis, with CT scan showing obstructing stone and right UVJ that was associated with hydronephrosis. -She underwent urostomy tube placement, which appears to be draining well. -Foley catheter remains in place -Urology was consulted during this hospitalization.  -Treating UTI with ampicillin IV  3.  History of left total knee arthroplasty -Complicated by infection -During this hospitalization she was seen by plastics and orthopedic surgery -Source of sepsis likely to be urinary -Dr. Sharol Given recommending one month of antibiotic therapy  4.  Hypokalemia. -A.m. lab work showing potassium of 2.3, she had been on Lasix therapy. -Replace with by mouth and IV potassium today, will repeat labs in a.m.  5.  History of hypertension. -Continue Coreg 3.125 mg by mouth twice a day  5.  Acute systolic congestive heart failure -Transthoracic echocardiogram showing EF of 25-30% -She is clinically euvolemic, had been on Lasix 40 mg IV twice a day -Replacing potassium -Cardiology following recommending repeating 2-D echo  6.  Probable demand ischemia -Evaluated by cardiology, lab work revealed peak troponin of 1.53 -Cardiology recommending a repeat echo, if LVEF remains low may consider ischemic workup. -Remains on IV heparin    DVT prophylaxis: Anticoagulated Code Status: Full code Family Communication: Spoke to her daughter at bedside Disposition Plan: Pending physical therapy evaluation  Consultants:    PCCM  Cardiology  IR  Plastics  Urology  Procedures:   Status post urostomy   Antimicrobials:   Amicillin   Subjective: She reports appetite remains poor. She denies chest pain or shortness of breath   Objective: Filed Vitals:   01/24/16 0552 01/24/16 0837 01/24/16 1308 01/24/16 1333  BP: 111/71  126/55   Pulse: 59 87 91   Temp: 98.3 F (36.8 C)  98.2 F (36.8 C)   TempSrc:   Oral   Resp: '20 18 18   '$ Height:      Weight: 54 kg (119 lb 0.8 oz)     SpO2: 91% 95% 97% 97%    Intake/Output Summary (Last 24 hours) at 01/24/16 1431 Last data filed at 01/24/16 1154  Gross per 24 hour  Intake    540 ml  Output   2250 ml  Net  -1710 ml   Filed Weights   01/23/16 0351 01/23/16 1023 01/24/16 0552  Weight: 54.2 kg (119 lb 7.8 oz) 57.5 kg (126 lb 12.2 oz) 54 kg (119 lb 0.8 oz)    Examination:  General exam: Nontoxic appearing, she is awake and alert, NAD Respiratory system: Clear to auscultation. Respiratory effort normal. Cardiovascular system: S1 & S2 heard, RRR. No JVD, murmurs, rubs, gallops or clicks. No pedal edema. Gastrointestinal system: Abdomen is nondistended, soft and nontender. No organomegaly or masses felt. Normal bowel sounds heard. Central nervous system: Alert and oriented. No focal neurological deficits. Extremities: Left knee is bandaged Skin: S/P urostomy tube placement to right  Psychiatry: having mild confusion     Data Reviewed: I have personally reviewed following labs and imaging studies  CBC:  Recent Labs Lab 01/20/16 1538 01/21/16 0130 01/21/16 1159 01/22/16 0404 01/22/16 1657 01/23/16 0400 01/24/16 0342  WBC 6.9 46.1* 57.1* 50.7*  --  53.3* 34.8*  NEUTROABS 6.3  --   --   --   --   --  32.4*  HGB 11.5* 9.2* 9.3* 8.3* 8.5* 8.1* 8.2*  HCT 37.7 29.6* 29.8* 26.5* 26.8* 25.5* 25.5*  MCV 85.3 88.1 83.9 82.8  --  83.3 82.0  PLT 389 200 179 134*  --  89* 77*   Basic Metabolic Panel:  Recent Labs Lab 01/21/16 0209   01/21/16 2100 01/22/16 0400 01/22/16 1500 01/23/16 0400 01/24/16 0342  NA 133*  < > 135 134* 137 140 141  K 3.5  < > 3.9 3.6 3.5 3.1* 2.3*  CL 105  < > 108 103 105 105 103  CO2 17*  < > 17* '22 24 27 28  '$ GLUCOSE 161*  < > 133* 161* 92 83 99  BUN 35*  < > 28* 26* 30* 33* 29*  CREATININE 1.43*  < > 0.94 1.10* 0.97 1.07* 0.95  CALCIUM 8.5*  < > 8.1* 8.0* 7.7* 7.9* 8.3*  MG 1.9  --   --   --   --   --   --   PHOS 4.2  --   --   --   --   --   --   < > = values in this interval not displayed. GFR: Estimated Creatinine Clearance: 41.1 mL/min (by C-G formula based on Cr of 0.95). Liver Function Tests:  Recent Labs Lab 01/20/16 1538 01/24/16 0342  AST 115* 164*  ALT 68* 472*  ALKPHOS 255* 212*  BILITOT 1.0 0.7  PROT 6.9 4.9*  ALBUMIN 2.6* 1.7*    Recent Labs Lab 01/20/16 1538  LIPASE 26   No results for input(s): AMMONIA in the last 168 hours. Coagulation Profile:  Recent Labs Lab 01/22/16 0020  INR 2.03*   Cardiac Enzymes:  Recent Labs Lab 01/21/16 2100 01/22/16 0020 01/22/16 0832 01/22/16 1230 01/22/16 1500  CKTOTAL  --  352*  --   --   --   CKMB  --  12.1*  --   --   --   TROPONINI 0.52* 0.79* 1.36* 1.52* 1.53*   BNP (last 3 results) No results for input(s): PROBNP in the last 8760 hours. HbA1C: No results for input(s): HGBA1C in the last 72 hours. CBG:  Recent Labs Lab 01/23/16 2113 01/24/16 0012 01/24/16 0516 01/24/16 0749 01/24/16 1159  GLUCAP 90 72 85 81 94   Lipid Profile: No results for input(s): CHOL, HDL, LDLCALC, TRIG, CHOLHDL, LDLDIRECT in the last 72 hours. Thyroid Function Tests: No results for input(s): TSH, T4TOTAL, FREET4, T3FREE, THYROIDAB in the last 72 hours. Anemia Panel: No results for input(s): VITAMINB12, FOLATE, FERRITIN, TIBC, IRON, RETICCTPCT in the last 72 hours. Sepsis Labs:  Recent Labs Lab 01/20/16 1648 01/20/16 1939 01/21/16 0130 01/21/16 0512 01/21/16 0843 01/22/16 0400 01/23/16 0400  PROCALCITON  --    --  >175.00 >175.00  --  >175.00 173.23  LATICACIDVEN 7.74* 5.90*  --  3.1* 3.0*  --   --     Recent Results (from the past 240 hour(s))  Blood Culture (routine x 2)     Status: Abnormal   Collection Time: 01/20/16  4:30 PM  Result Value Ref Range Status   Specimen Description BLOOD RIGHT HAND  Final   Special Requests BOTTLES DRAWN AEROBIC ONLY 5CC  Final   Culture  Setup Time   Final    GRAM NEGATIVE RODS AEROBIC BOTTLE ONLY CRITICAL RESULT CALLED TO, READ BACK BY AND VERIFIED WITH: K COMBS,PHARMD AT 0743 01/21/16 BY L BENFIELD    Culture (A)  Final    PROTEUS MIRABILIS SUSCEPTIBILITIES PERFORMED ON PREVIOUS CULTURE WITHIN THE LAST 5 DAYS.    Report Status 01/23/2016 FINAL  Final  Blood Culture (routine x 2)     Status: Abnormal   Collection Time: 01/20/16  4:40 PM  Result Value Ref Range Status   Specimen Description BLOOD LEFT ANTECUBITAL  Final   Special Requests BOTTLES DRAWN AEROBIC AND ANAEROBIC 5CC  Final   Culture  Setup Time   Final    GRAM NEGATIVE RODS IN BOTH AEROBIC AND ANAEROBIC BOTTLES CRITICAL RESULT CALLED TO, READ BACK BY AND VERIFIED WITH: K COMBS,PHARMD AT 9528 01/21/16 BY L BENFIELD    Culture PROTEUS MIRABILIS (A)  Final   Report Status 01/23/2016 FINAL  Final   Organism ID, Bacteria PROTEUS MIRABILIS  Final      Susceptibility   Proteus mirabilis - MIC*    AMPICILLIN <=2 SENSITIVE Sensitive     CEFAZOLIN <=4 SENSITIVE Sensitive     CEFEPIME <=1 SENSITIVE Sensitive     CEFTAZIDIME <=1 SENSITIVE Sensitive     CEFTRIAXONE <=1 SENSITIVE Sensitive     CIPROFLOXACIN <=0.25 SENSITIVE Sensitive     GENTAMICIN <=1 SENSITIVE Sensitive     IMIPENEM 2 SENSITIVE Sensitive     TRIMETH/SULFA <=20 SENSITIVE Sensitive     AMPICILLIN/SULBACTAM <=2 SENSITIVE Sensitive     PIP/TAZO <=4 SENSITIVE Sensitive     * PROTEUS MIRABILIS  Blood Culture ID Panel (Reflexed)     Status: Abnormal   Collection Time: 01/20/16  4:40 PM  Result Value Ref Range Status    Enterococcus species NOT DETECTED NOT DETECTED Final   Vancomycin resistance NOT DETECTED NOT  DETECTED Final   Listeria monocytogenes NOT DETECTED NOT DETECTED Final   Staphylococcus species NOT DETECTED NOT DETECTED Final   Staphylococcus aureus NOT DETECTED NOT DETECTED Final   Methicillin resistance NOT DETECTED NOT DETECTED Final   Streptococcus species NOT DETECTED NOT DETECTED Final   Streptococcus agalactiae NOT DETECTED NOT DETECTED Final   Streptococcus pneumoniae NOT DETECTED NOT DETECTED Final   Streptococcus pyogenes NOT DETECTED NOT DETECTED Final   Acinetobacter baumannii NOT DETECTED NOT DETECTED Final   Enterobacteriaceae species DETECTED (A) NOT DETECTED Final    Comment: CRITICAL RESULT CALLED TO, READ BACK BY AND VERIFIED WITH: K COMBS,PHARMD AT 0743 01/21/16 BY L BENFIELD    Enterobacter cloacae complex NOT DETECTED NOT DETECTED Final   Escherichia coli NOT DETECTED NOT DETECTED Final   Klebsiella oxytoca NOT DETECTED NOT DETECTED Final   Klebsiella pneumoniae NOT DETECTED NOT DETECTED Final   Proteus species DETECTED (A) NOT DETECTED Final    Comment: CRITICAL RESULT CALLED TO, READ BACK BY AND VERIFIED WITH: K COMBS,PHARMD AT 0743 01/21/16 BY L BENFIELD    Serratia marcescens NOT DETECTED NOT DETECTED Final   Carbapenem resistance NOT DETECTED NOT DETECTED Final   Haemophilus influenzae NOT DETECTED NOT DETECTED Final   Neisseria meningitidis NOT DETECTED NOT DETECTED Final   Pseudomonas aeruginosa NOT DETECTED NOT DETECTED Final   Candida albicans NOT DETECTED NOT DETECTED Final   Candida glabrata NOT DETECTED NOT DETECTED Final   Candida krusei NOT DETECTED NOT DETECTED Final   Candida parapsilosis NOT DETECTED NOT DETECTED Final   Candida tropicalis NOT DETECTED NOT DETECTED Final  Urine culture     Status: Abnormal   Collection Time: 01/20/16  6:32 PM  Result Value Ref Range Status   Specimen Description URINE, CATHETERIZED  Final   Special Requests NONE   Final   Culture 7,000 COLONIES/mL INSIGNIFICANT GROWTH (A)  Final   Report Status 01/22/2016 FINAL  Final  Urine culture     Status: Abnormal   Collection Time: 01/20/16 10:06 PM  Result Value Ref Range Status   Specimen Description URINE, CATHETERIZED  Final   Special Requests RIGHT KIDNEY  Final   Culture 10,000 COLONIES/mL PROTEUS MIRABILIS (A)  Final   Report Status 01/23/2016 FINAL  Final   Organism ID, Bacteria PROTEUS MIRABILIS (A)  Final      Susceptibility   Proteus mirabilis - MIC*    AMPICILLIN <=2 SENSITIVE Sensitive     CEFAZOLIN <=4 SENSITIVE Sensitive     CEFTRIAXONE <=1 SENSITIVE Sensitive     CIPROFLOXACIN <=0.25 SENSITIVE Sensitive     GENTAMICIN <=1 SENSITIVE Sensitive     IMIPENEM 1 SENSITIVE Sensitive     NITROFURANTOIN 128 RESISTANT Resistant     TRIMETH/SULFA <=20 SENSITIVE Sensitive     AMPICILLIN/SULBACTAM <=2 SENSITIVE Sensitive     PIP/TAZO <=4 SENSITIVE Sensitive     * 10,000 COLONIES/mL PROTEUS MIRABILIS  MRSA PCR Screening     Status: None   Collection Time: 01/21/16  1:00 AM  Result Value Ref Range Status   MRSA by PCR NEGATIVE NEGATIVE Final    Comment:        The GeneXpert MRSA Assay (FDA approved for NASAL specimens only), is one component of a comprehensive MRSA colonization surveillance program. It is not intended to diagnose MRSA infection nor to guide or monitor treatment for MRSA infections.          Radiology Studies: No results found.      Scheduled Meds: . antiseptic  oral rinse  7 mL Mouth Rinse q12n4p  . carvedilol  3.125 mg Oral BID WC  . cefTRIAXone (ROCEPHIN)  IV  2 g Intravenous Q24H  . chlorhexidine  15 mL Mouth Rinse BID  . feeding supplement (ENSURE ENLIVE)  237 mL Oral TID BM  . insulin aspart  0-9 Units Subcutaneous Q4H  . potassium chloride  40 mEq Oral Q4H   Continuous Infusions: . heparin 1,200 Units/hr (01/24/16 0823)     LOS: 4 days    Time spent: 35 min    Kelvin Cellar, MD [facility] Pager [phone]  If 7PM-7AM, please contact night-coverage [email or web address] Password [facility] 01/24/2016, 2:31 PM

## 2016-01-24 NOTE — Evaluation (Signed)
Physical Therapy Evaluation Patient Details Name: [name] MRN: [id number] DOB: 1940/12/18 Today's Date: 01/24/2016   History of Present Illness  pt is a 75 y/o female with pmh of CAD, HTN, CVD, PVD, MRSA, THR's R, TKA L with infection, I and D with poly exchange, skin grafting to L knee, admitted with 2 wk h/o crampy abdominal pain, frequency, urgency, dysuria and onset of n/v day of admit.  Working dx's--septic shock, ARF and obstructing uropathy, s/p right percutaneous nephostomy.  Clinical Impression  Pt admitted with/for obstructing uropathy and septic shock with delerium.  Pt currently limited functionally due to the problems listed. ( See problems list.)   Pt will benefit from PT to maximize function and safety in order to get ready for next venue listed below.     Follow Up Recommendations SNF    Equipment Recommendations  Other (comment) (TBA next venue)    Recommendations for Other Services       Precautions / Restrictions Precautions Precautions: Fall Restrictions Weight Bearing Restrictions: Yes Other Position/Activity Restrictions: WBAT LLE for transfers only per daughter in law- need to clarify orders      Mobility  Bed Mobility Overal bed mobility: Needs Assistance Bed Mobility: Supine to Sit;Sit to Supine     Supine to sit: Mod assist;+2 for safety/equipment Sit to supine: Mod assist;+2 for physical assistance   General bed mobility comments: cues for direction and assist  to come up via R elbow to sit  Transfers Overall transfer level: Needs assistance   Transfers: Sit to/from Stand Sit to Stand: Mod assist         General transfer comment: assist to come forward and up.  Once up, pt stood during pericare with min assist only  Ambulation/Gait                Stairs            Wheelchair Mobility    Modified Rankin (Stroke Patients Only)       Balance Overall balance assessment: Needs assistance Sitting-balance support:  No upper extremity supported;Bilateral upper extremity supported Sitting balance-Leahy Scale: Poor Sitting balance - Comments: generally poor, but after standing trial, pt able to maintain sitting without challenge with no assist   Standing balance support: Bilateral upper extremity supported Standing balance-Leahy Scale: Poor Standing balance comment: stood at EOB for about 5 min during pericare.                             Pertinent Vitals/Pain Pain Assessment: Faces Faces Pain Scale: Hurts little more Pain Location: vague sometimes saying not in pain and othertimes yes in pain' Pain Descriptors / Indicators: Grimacing;Moaning Pain Intervention(s): Monitored during session;Repositioned    Home Living Family/patient expects to be discharged to:: Private residence Living Arrangements: Other relatives Available Help at Discharge: Family;Available 24 hours/day Type of Home: House       Home Layout: One level Home Equipment: [place] - 2 wheels;Bedside commode;Wheelchair - manual      Prior Function Level of Independence: Needs assistance   Gait / Transfers Assistance Needed: Non ambulatory at this time, per ortho doc only WBAT for SPT with assist.  ADL's / Homemaking Assistance Needed: Able to help wash upper body but requires assist with LB.  Comments: Pt getting HHOT from [place].     Hand Dominance   Dominant Hand: Right    Extremity/Trunk Assessment   Upper Extremity Assessment: Defer to OT evaluation  Lower Extremity Assessment: Generalized weakness;LLE deficits/detail   LLE Deficits / Details: Await WBS and orders to begin knee mobility. presently significantly weak overall at 2+/5     Communication   Communication: No difficulties  Cognition Arousal/Alertness: Lethargic Behavior During Therapy:  (delerious) Overall Cognitive Status: Impaired/Different from baseline                      General Comments      Exercises         Assessment/Plan    PT Assessment Patient needs continued PT services  PT Diagnosis Generalized weakness;Acute pain   PT Problem List Decreased strength;Decreased activity tolerance;Decreased balance;Decreased mobility;Decreased cognition;Decreased knowledge of use of DME;Pain  PT Treatment Interventions DME instruction;Gait training;Functional mobility training;Therapeutic activities;Therapeutic exercise;Balance training;Patient/family education   PT Goals (Current goals can be found in the Care Plan section) Acute Rehab PT Goals Patient Stated Goal: To get more therapy and ultimately get home. PT Goal Formulation: With family Time For Goal Achievement: 02/07/16 Potential to Achieve Goals: Good    Frequency Min 3X/week   Barriers to discharge        Co-evaluation               End of Session Equipment Utilized During Treatment: Left knee immobilizer Activity Tolerance: Patient tolerated treatment well Patient left: in bed;with call bell/phone within reach;with family/visitor present Nurse Communication: Mobility status         Time: 3202-3343 PT Time Calculation (min) (ACUTE ONLY): 42 min   Charges:   PT Evaluation $PT Eval Moderate Complexity: 1 Procedure PT Treatments $Therapeutic Activity: 23-37 mins   PT G Codes:        [name], [name] 01/24/2016, 5:43 PM 01/24/2016  [name], [name] [phone]  (pager)

## 2016-01-25 DIAGNOSIS — N139 Obstructive and reflux uropathy, unspecified: Secondary | ICD-10-CM | POA: Insufficient documentation

## 2016-01-25 DIAGNOSIS — E876 Hypokalemia: Secondary | ICD-10-CM | POA: Insufficient documentation

## 2016-01-25 LAB — CBC
HCT: 26 % — ABNORMAL LOW (ref 36.0–46.0)
Hemoglobin: 8.5 g/dL — ABNORMAL LOW (ref 12.0–15.0)
MCH: 28.6 pg (ref 26.0–34.0)
MCHC: 32.7 g/dL (ref 30.0–36.0)
MCV: 87.5 fL (ref 78.0–100.0)
PLATELETS: 80 10*3/uL — AB (ref 150–400)
RBC: 2.97 MIL/uL — ABNORMAL LOW (ref 3.87–5.11)
RDW: 16.9 % — AB (ref 11.5–15.5)
WBC: 20.3 10*3/uL — ABNORMAL HIGH (ref 4.0–10.5)

## 2016-01-25 LAB — BASIC METABOLIC PANEL
Anion gap: 12 (ref 5–15)
BUN: 19 mg/dL (ref 6–20)
CALCIUM: 8.4 mg/dL — AB (ref 8.9–10.3)
CO2: 24 mmol/L (ref 22–32)
Chloride: 107 mmol/L (ref 101–111)
Creatinine, Ser: 0.72 mg/dL (ref 0.44–1.00)
GFR calc Af Amer: >60 mL/min (ref >60)
GLUCOSE: 85 mg/dL (ref 65–99)
Potassium: 3.8 mmol/L (ref 3.5–5.1)
Sodium: 143 mmol/L (ref 135–145)

## 2016-01-25 LAB — GLUCOSE, CAPILLARY
GLUCOSE-CAPILLARY: 106 mg/dL — AB (ref 65–99)
GLUCOSE-CAPILLARY: 89 mg/dL (ref 65–99)
GLUCOSE-CAPILLARY: 92 mg/dL (ref 65–99)
Glucose-Capillary: 98 mg/dL (ref 65–99)
Glucose-Capillary: 148 mg/dL — ABNORMAL HIGH (ref 65–99)
Glucose-Capillary: 158 mg/dL — ABNORMAL HIGH (ref 65–99)
Glucose-Capillary: 111 mg/dL — ABNORMAL HIGH (ref 65–99)

## 2016-01-25 LAB — HEPARIN LEVEL (UNFRACTIONATED): Heparin Unfractionated: 0.36 IU/mL (ref 0.30–0.70)

## 2016-01-25 MED ORDER — BLISTEX MEDICATED EX OINT
TOPICAL_OINTMENT | CUTANEOUS | Status: DC | PRN
  Administered 2016-01-25 – 2016-01-30 (×3): via TOPICAL
  Filled 2016-01-25 (×2): qty 6.3

## 2016-01-25 NOTE — Progress Notes (Signed)
Hospital Problem List     Principal Problem:   Septic shock ([place]) Active Problems:   Pressure ulcer   Elevated troponin   Abnormal echocardiogram   Hypoxia   Sepsis [facility])    Patient Profile:   Primary Cardiologist: Dr. [name]  75 yo female w/ PMH of CAD (s/p DES to Cx and RCA in 2008), PAD (bilateral CIA stenosis treated medically), HTN, and HLD who was admitted for hydronephrosis and sepsis. Cards consulted on 6/26 for newly reduced EF of 25%.   Subjective   Denies any chest pain or shortness of breath overnight. Reports seeing bugs in her room, none visible.   Inpatient Medications    . ampicillin (OMNIPEN) IV  1 g Intravenous Q6H  . antiseptic oral rinse  7 mL Mouth Rinse q12n4p  . carvedilol  3.125 mg Oral BID WC  . chlorhexidine  15 mL Mouth Rinse BID  . feeding supplement (ENSURE ENLIVE)  237 mL Oral TID BM  . insulin aspart  0-9 Units Subcutaneous Q4H  . multivitamin with minerals  1 tablet Oral Daily    Vital Signs    Filed Vitals:   01/24/16 2112 01/25/16 0200 01/25/16 0552 01/25/16 0836  BP: 131/76  122/72   Pulse: 94 92 96   Temp: 99.1 F (37.3 C)  98.3 F (36.8 C)   TempSrc:      Resp: '17 18 17 18  '$ Height:      Weight:   117 lb 15.1 oz (53.5 kg)   SpO2: 95% 96% 95%     Intake/Output Summary (Last 24 hours) at 01/25/16 1047 Last data filed at 01/25/16 0916  Gross per 24 hour  Intake    160 ml  Output    550 ml  Net   -390 ml   Filed Weights   01/23/16 1023 01/24/16 0552 01/25/16 0552  Weight: 126 lb 12.2 oz (57.5 kg) 119 lb 0.8 oz (54 kg) 117 lb 15.1 oz (53.5 kg)    Physical Exam    General: Well developed, well nourished, female appearing in no acute distress. Head: Normocephalic, atraumatic.  Neck: Supple without bruits, JVD at 9cm. Lungs:  Resp regular and unlabored, no wheezing or rales appreciated. Heart: RRR, S1, S2, no S3, S4, or murmur; no rub. Abdomen: Soft, non-tender, non-distended with normoactive bowel sounds. No  hepatomegaly. No rebound/guarding. No obvious abdominal masses. Extremities: No clubbing, cyanosis, or edema. Distal pedal pulses are 2+ bilaterally. Neuro: Alert and oriented X 2. Moves all extremities spontaneously. Psych: Normal affect.  Labs    CBC  Recent Labs  01/24/16 0342 01/25/16 0540  WBC 34.8* 20.3*  NEUTROABS 32.4*  --   HGB 8.2* 8.5*  HCT 25.5* 26.0*  MCV 82.0 87.5  PLT 77* 80*   Basic Metabolic Panel  Recent Labs  01/24/16 0342 01/25/16 0540  NA 141 143  K 2.3* 3.8  CL 103 107  CO2 28 24  GLUCOSE 99 85  BUN 29* 19  CREATININE 0.95 0.72  CALCIUM 8.3* 8.4*   Liver Function Tests  Recent Labs  01/24/16 0342  AST 164*  ALT 472*  ALKPHOS 212*  BILITOT 0.7  PROT 4.9*  ALBUMIN 1.7*   No results for input(s): LIPASE, AMYLASE in the last 72 hours. Cardiac Enzymes  Recent Labs  01/22/16 1230 01/22/16 1500  TROPONINI 1.52* 1.53*    Telemetry    NSR, HR in 80 - 90's with PVC's. Episodes of ventricular bigeminy.  ECG  No new tracings.   Cardiac Studies and Radiology    Ct Abdomen Pelvis W Contrast  01/20/2016  CLINICAL DATA:  Right lower quadrant abdominal pain for 5 days with nausea and vomiting. EXAM: CT ABDOMEN AND PELVIS WITH CONTRAST TECHNIQUE: Multidetector CT imaging of the abdomen and pelvis was performed using the standard protocol following bolus administration of intravenous contrast. CONTRAST:  15m ISOVUE-300 IOPAMIDOL (ISOVUE-300) INJECTION 61% COMPARISON:  08/06/2005 CT abdomen/ pelvis. 02/14/2007 abdominal sonogram. FINDINGS: Lower chest: Centrilobular emphysema. Right middle lobe 3 mm pulmonary nodule (series 205/ image 6) is stable since 2007 and benign. Left main, left anterior descending, left circumflex and right coronary atherosclerosis. Hepatobiliary: Heterogeneous liver parenchymal attenuation, with geographic areas of low attenuation in the left liver lobe. No definite liver surface irregularity. No liver mass. Mild  diffuse periportal edema. Numerous subcentimeter calcified gallstones layering in the nondistended gallbladder, with no definite gallbladder wall thickening or pericholecystic fluid. No biliary ductal dilatation. Pancreas: Normal, with no mass or duct dilation. Spleen: Normal size. No mass. Adrenals/Urinary Tract: Mild diffuse irregular thickening of both adrenal glands without discrete adrenal mass, not definitely changed back to 2007, suggesting adrenal hyperplasia . Probable 2 mm stone at the right ureterovesical junction, noting limited visualization of the pelvic segment of the right ureter due to streak artifact from the right hip hardware. Separate 3 mm stone in the mid right pelvic ureter, with mild right hydroureteronephrosis. Additional nonobstructing 4 mm interpolar and 5 mm lower right renal stones. Exophytic simple 3.2 cm renal cyst in the anterior mid to upper right kidney. No left hydronephrosis. Bladder poorly visualized due to streak artifact, with no gross bladder abnormality. Stomach/Bowel: Grossly normal stomach. Normal caliber small bowel with no small bowel wall thickening. Normal appendix. Mild sigmoid diverticulosis, with no large bowel wall thickening or pericolonic fat stranding. Prominent rectal stool with rectal diameter 8.0 cm. No definite rectal wall thickening. Vascular/Lymphatic: Atherosclerotic nonaneurysmal abdominal aorta. Insert vein No pathologically enlarged lymph nodes in the abdomen or pelvis. Reproductive: Status post hysterectomy, with no gross abnormal findings at the vaginal cuff. No adnexal mass. Other: No pneumoperitoneum, ascites or focal fluid collection. Musculoskeletal: No aggressive appearing focal osseous lesions. Right total hip arthroplasty is partially visualized. Moderate degenerative changes in the visualized thoracolumbar spine. Moderate to marked compression fracture of the T11 vertebral body of indeterminate chronicity, which appears new since the lateral  09/20/2015 chest radiograph. IMPRESSION: 1. Obstructing 2 mm right UVJ stone with mild right hydroureteronephrosis. Separate 3 mm stone in the mid right pelvic ureter. Additional nonobstructing right renal stones. 2. Suggestion of fecal impaction in the rectum. No definite rectal wall thickening. 3. Moderate to severe T11 vertebral compression fracture of indeterminate chronicity, which appears new since 09/20/2015. 4. Nonspecific liver parenchymal heterogeneity without discrete liver mass, favor hepatic steatosis, cannot exclude hepatic fibrosis. 5. Additional findings include left main and 3 vessel coronary atherosclerosis, centrilobular emphysema, mild sigmoid diverticulosis and cholelithiasis. Electronically Signed   By: JIlona SorrelM.D.   On: 01/20/2016 18:38   Dg Chest Port 1 View  01/22/2016  CLINICAL DATA:  Sepsis EXAM: PORTABLE CHEST 1 VIEW COMPARISON:  Portable exam 0502 hours compared 01/21/2016 FINDINGS: RIGHT jugular central venous catheter tip projecting over SVC. Minimal enlargement of cardiac silhouette. Calcified tortuous aorta. Mediastinal contours and pulmonary vascularity otherwise normal. Persistent infiltrates in mid to lower lungs bilaterally greater on RIGHT. Question underlying emphysematous changes. No gross pleural effusion or pneumothorax. IMPRESSION: Persistent pulmonary infiltrates. Aortic atherosclerosis. Electronically Signed  By: Lavonia Dana M.D.   On: 01/22/2016 08:41   Dg Chest Port 1 View  01/21/2016  CLINICAL DATA:  75 year old female with a history of hypoxia EXAM: PORTABLE CHEST 1 VIEW COMPARISON:  01/20/2016 FINDINGS: Cardiomediastinal silhouette unchanged, though partially obscured by overlying lung and pleural disease. Worsening interstitial and airspace opacities. Worsening interlobular septal thickening. Low lung volumes. Unchanged right IJ approach central venous catheter which appears to terminate superior vena cava. Calcifications of the aortic arch. No  displaced fracture. IMPRESSION: Worsening interstitial and airspace opacities, which is most compatible with worsening edema and likely small pleural effusions. Underlying infection not excluded. Aortic atherosclerosis. Unchanged right IJ central catheter. Signed, Dulcy Fanny. Earleen Newport, DO [facility] Electronically Signed   By: Corrie Mckusick D.O.   On: 01/21/2016 21:31   Dg Chest Portable 1 View  01/20/2016  CLINICAL DATA:  Central line placement EXAM: PORTABLE CHEST 1 VIEW COMPARISON:  6/23/ 17 FINDINGS: Cardiomediastinal silhouette is stable. Streaky interstitial prominence/ pneumonitis bilateral lower lobe and right upper lobe again noted. There is right IJ central line with tip in SVC. No pneumothorax. IMPRESSION: Right IJ central line in place.  No pneumothorax. Electronically Signed   By: Lahoma Crocker M.D.   On: 01/20/2016 20:51   Dg Chest Portable 1 View  01/20/2016  CLINICAL DATA:  Nausea and vomiting.  Pain. EXAM: PORTABLE CHEST 1 VIEW COMPARISON:  09/20/2015. FINDINGS: Mediastinum and hilar structures are normal. Mild bilateral upper lobe and bibasilar infiltrates. Small bilateral pleural effusions cannot be excluded. Heart size normal. Degenerative changes both shoulders. IMPRESSION: Mild bilateral upper lobe and bibasilar pulmonary infiltrates consistent with pneumonia. Electronically Signed   By: Marcello Moores  Register   On: 01/20/2016 17:00   Ir Nephrostomy Placement Right  01/21/2016  CLINICAL DATA:  Right ureteral obstruction from calculus causing hydronephrosis and acute sepsis. The patient requires emergent percutaneous nephrostomy tube to decompress the right kidney. EXAM: 1. ULTRASOUND GUIDANCE FOR PUNCTURE OF THE RIGHT RENAL COLLECTING SYSTEM. 2. RIGHT PERCUTANEOUS NEPHROSTOMY TUBE PLACEMENT. COMPARISON:  CT of the abdomen and pelvis earlier today on 01/20/2016. ANESTHESIA/SEDATION: 1.0 mg IV Versed; 25 mcg IV Fentanyl. Total Moderate  Sedation Time 15 minutes The patient's level of consciousness and physiologic status were continuously monitored during the procedure by Radiology nursing. CONTRAST:  20 ml Omnipaque 300 MEDICATIONS: No additional medications. Scheduled IV Fortaz and vancomycin were started just prior to the procedure. FLUOROSCOPY TIME:  1 minutes and 6 seconds. PROCEDURE: The procedure, risks, benefits, and alternatives were explained to the patient's son. Questions regarding the procedure were encouraged and answered. The patient's son understands and consents to the procedure. The right flank region was prepped with chlorhexidine in a sterile fashion, and a sterile drape was applied covering the operative field. A sterile gown and sterile gloves were used for the procedure. Local anesthesia was provided with 1% Lidocaine. Ultrasound was used to localize the right kidney. Under direct ultrasound guidance, a 21 gauge needle was advanced into the renal collecting system. Ultrasound image documentation was performed. Aspiration of urine sample was performed followed by contrast injection. A transitional dilator was advanced over a guidewire. Percutaneous tract dilatation was then performed over the guidewire. A 10 -French percutaneous nephrostomy tube was then advanced and formed in the collecting system. Catheter position was confirmed by fluoroscopy after contrast injection. The catheter was secured at the skin with a Prolene retention suture and Stat-Lock device. A gravity bag was placed. COMPLICATIONS: None. FINDINGS: Ultrasound demonstrates  moderate right hydronephrosis. After access of the collecting system, there was return of blood tinged urine. A sample was sent for culture analysis. The 10 French nephrostomy tube was formed in the renal pelvis. IMPRESSION: Right percutaneous nephrostomy tube placement for acute decompression of the right kidney. A 10 French catheter was placed and formed in the renal pelvis. This tube was  attached to a gravity drainage bag. A sample of urine was sent for culture analysis. Electronically Signed   By: Aletta Edouard M.D.   On: 01/21/2016 09:12    Echocardiogram: 01/22/2016 Study Conclusions - Left ventricle: Septal , apical mid and distal inferior wall and  distal anterior wall hypokinesis The cavity size was moderately  dilated. There was mild focal basal hypertrophy of the septum.  Systolic function was severely reduced. The estimated ejection  fraction was in the range of 25% to 30%. Left ventricular  diastolic function parameters were normal. - Aortic valve: There was mild regurgitation. - Mitral valve: There was mild regurgitation. - Left atrium: The atrium was mildly dilated. - Atrial septum: No defect or patent foramen ovale was identified.  Assessment & Plan    1. Acute Systolic CHF - Echo this admission shows a reduced EF of 25% with mild focal hypertrophy of the septum. Denies any recent anginal symptoms.  - echo findings possibly related to stress-cardiomyopathy associated with acute sepsis. Will repeat a limited echo tomorrow to assess for improvement in LV function. - if EF remains decreased, would require further ischemic evaluation pending improvement in her current condition. Platelets improving, currently at 80K. - continue Coreg. Consider addition of ACE-I prior to discharge if BP will allow and EF remains reduced.  2.  Elevated troponin - cyclic troponin values were 0.07, 0.52, 0.79, 1.36, and 1.52. - occurring in the setting of severe sepsis, most consistent with demand ischemia.  - remains on IV Heparin. If repeat echo tomorrow shows improved EF, would d/c.  3. Ventricular Bigeminy - episodes noted on telemetry.  - has been started on BB. Titrate as HR and BP allows.  4. Urosepsis - per admitting team   Signed, Erma Heritage , PA-C 10:47 AM 01/25/2016 Pager: [phone]  Patient seen, examined. Available data reviewed. Agree  with findings, assessment, and plan as outlined by Bernerd Pho, PA-C. Discussed plan with patient, son, and daughter-in-law at bedside. Will check 2D echo tomorrow to reassess LV function. Continue current medical therapy with carvedilol, possibly add ACE tomorrow pending echo results. Even if LV function remains severely depressed, not sure she will be candidate for invasive cardiac eval in the context of her multiple medical problems, infections, and ongoing confusion. Family understands.   Sherren Mocha, M.D. 01/25/2016 4:40 PM

## 2016-01-25 NOTE — Progress Notes (Signed)
PROGRESS NOTE    [name]  ZOX:[id number] DOB: 1941/06/14 DOA: 01/20/2016 PCP: Kirk Ruths, MD   Brief Narrative:  [name] is a 75 year old female with a past medical history of a total left knee arthroplasty seizure performed on 09/28/2015 by Dr. Sharol Given of orthopedic surgery, was complicated by infection undergoing removal of hardware and placement of antibiotic beads on 10/12/2015, procedure performed by Dr. Sharol Given. Unfortunately she had dehiscence of left knee arthroplasty undergoing irrigation and debridement on 10/18/2015. She was admitted to the pulmonary critical care service on 01/20/2016 presenting with abdominal pain, dysuria, nausea, vomiting, found have a temperature of 103, hypotensive, and required pressor support. She was further worked up with CT scan of abdomen and pelvis that revealed obstructing UVJ stone with associated hydroureteronephrosis. Urology was consulted, she underwent nephrostomy tube placement. Urine cultures and blood cultures both growing Proteus mirabilis, with repeat blood cultures performed on 01/23/2016 showing no growth to date. She is on ceftriaxone 2 g IV every 24 hours. During this hospitalization she was seen by her orthopedic surgeon Dr Sharol Given who recommended at least 4 weeks of antibiotic coverage. On 01/24/2016 her care was transferred from [facility] to medicine.    Assessment & Plan:   Principal Problem:   Septic shock ([place]) Active Problems:   Pressure ulcer   Elevated troponin   Abnormal echocardiogram   Hypoxia   Sepsis ([place])  1. Septic shock -Courtney. James is a 75 year old female she presented in shock likely secondary to urosepsis, requiring IV pressor support and ICU admission. -Source of infection likely pyelonephritis/UTI, as CT scan revealed evidence of obstructive uropathy. Radiology reported obstructing 2 mm right UVJ stone with associated hydronephrosis. -Both urine and blood cultures growing Proteus -She is now hemodynamically  stable off of pressor support and has been transferred to the medicine service. -Susceptibility testing showing organism is sensitive to beta lactams. WBC trending down and currently at 20.3 from 57.1. -Discontinued ceftriaxone and started with ampicillin -Repeat blood cultures drawn on 01/23/2016 show no growth after overnight incubation. Would transition to oral antimicrobial therapy in the next 24 hours if repeat blood cultures remain negative.  2. Obstructive uropathy. -She was admitted for urosepsis, with CT scan showing obstructing stone and right UVJ that was associated with hydronephrosis. -She underwent urostomy tube placement, which appears to be draining well. -Foley catheter remains in place -Urology was consulted during this hospitalization.  -Treating UTI with ampicillin IV  3. History of left total knee arthroplasty -Complicated by infection -During this hospitalization she was seen by plastics and orthopedic surgery -Source of sepsis likely to be urinary -Dr. Sharol Given recommending one month of antibiotic therapy  4. Hypokalemia. -A.m. lab work showing potassium of 2.3 on 01/25/2016, she had been on Lasix therapy. -Replaced with by mouth and IV potassium. Repeat labs with potassium of 3.8.   5. History of hypertension. -Continue Coreg 3.125 mg by mouth twice a day  5. Acute systolic congestive heart failure -Transthoracic echocardiogram showing EF of 25-30% -She is clinically euvolemic, had been on Lasix 40 mg IV twice a day -Replacing potassium -Cardiology following recommending repeating 2-D echo on 01/26/2016 with further recommendations to follow pending 2-D echo outcome.  6. Probable demand ischemia/Elevated troponin -Evaluated by cardiology, lab work revealed peak troponin of 1.53 -Cardiology recommending a repeat echo, if LVEF remains low may consider ischemic workup. -Remains on IV heparin     DVT prophylaxis: SCDs Code Status: Full Family  Communication: Updated patient and daughter-in-law at bedside. Disposition Plan:  Likely skilled nursing facility when medically stable.   Consultants:   Urology: Dr. Diona Fanti 01/20/2016  Interventional radiology Dr. Kathlene Cote 01/20/2016  Cardiology: Dr. Burt Knack 01/23/2016  Orthopedics: Dr. Sharol Given 01/24/2016  Plastic surgery  Procedures:   CT abdomen and pelvis 01/20/2016  Chest x-ray 01/20/2016, 01/21/2016, 01/22/2016  2-D echo 01/22/2016  Right percutaneous nephrostomy tube placement Dr. Kathlene Cote 01/20/2016  Antimicrobials:   IV Fortaz 01/20/2016>>>> 01/23/2016  IV Rocephin 01/23/2016>>>> 01/24/2016  IV vancomycin 01/20/2016>>>> 01/22/2016  IV ampicillin 01/24/2016   Subjective: Patient denies any chest pain. No shortness of breath. Patient complaining of pain while IV is been inserted.  Objective: Filed Vitals:   01/24/16 2112 01/25/16 0200 01/25/16 0552 01/25/16 0836  BP: 131/76  122/72   Pulse: 94 92 96   Temp: 99.1 F (37.3 C)  98.3 F (36.8 C)   TempSrc:      Resp: '17 18 17 18  '$ Height:      Weight:   53.5 kg (117 lb 15.1 oz)   SpO2: 95% 96% 95%     Intake/Output Summary (Last 24 hours) at 01/25/16 1212 Last data filed at 01/25/16 0916  Gross per 24 hour  Intake    160 ml  Output    250 ml  Net    -90 ml   Filed Weights   01/23/16 1023 01/24/16 0552 01/25/16 0552  Weight: 57.5 kg (126 lb 12.2 oz) 54 kg (119 lb 0.8 oz) 53.5 kg (117 lb 15.1 oz)    Examination:  General exam: Appears calm and comfortable  Respiratory system: Clear to auscultation anterior lung fields . Respiratory effort normal. Cardiovascular system: S1 & S2 heard, RRR. No JVD, murmurs, rubs, gallops or clicks. No pedal edema. Gastrointestinal system: Abdomen is nondistended, soft and nontender. No organomegaly or masses felt. Normal bowel sounds heard. Central nervous system: Alert and oriented. No focal neurological deficits. Extremities: Symmetric 5 x 5 power. Skin: No  rashes, lesions or ulcers Psychiatry: Judgement and insight appear normal. Mood & affect appropriate.     Data Reviewed: I have personally reviewed following labs and imaging studies  CBC:  Recent Labs Lab 01/20/16 1538  01/21/16 1159 01/22/16 0404 01/22/16 1657 01/23/16 0400 01/24/16 0342 01/25/16 0540  WBC 6.9  < > 57.1* 50.7*  --  53.3* 34.8* 20.3*  NEUTROABS 6.3  --   --   --   --   --  32.4*  --   HGB 11.5*  < > 9.3* 8.3* 8.5* 8.1* 8.2* 8.5*  HCT 37.7  < > 29.8* 26.5* 26.8* 25.5* 25.5* 26.0*  MCV 85.3  < > 83.9 82.8  --  83.3 82.0 87.5  PLT 389  < > 179 134*  --  89* 77* 80*  < > = values in this interval not displayed. Basic Metabolic Panel:  Recent Labs Lab 01/21/16 0209  01/22/16 0400 01/22/16 1500 01/23/16 0400 01/24/16 0342 01/25/16 0540  NA 133*  < > 134* 137 140 141 143  K 3.5  < > 3.6 3.5 3.1* 2.3* 3.8  CL 105  < > 103 105 105 103 107  CO2 17*  < > '22 24 27 28 24  '$ GLUCOSE 161*  < > 161* 92 83 99 85  BUN 35*  < > 26* 30* 33* 29* 19  CREATININE 1.43*  < > 1.10* 0.97 1.07* 0.95 0.72  CALCIUM 8.5*  < > 8.0* 7.7* 7.9* 8.3* 8.4*  MG 1.9  --   --   --   --   --   --  PHOS 4.2  --   --   --   --   --   --   < > = values in this interval not displayed. GFR: Estimated Creatinine Clearance: 48.8 mL/min (by C-G formula based on Cr of 0.72). Liver Function Tests:  Recent Labs Lab 01/20/16 1538 01/24/16 0342  AST 115* 164*  ALT 68* 472*  ALKPHOS 255* 212*  BILITOT 1.0 0.7  PROT 6.9 4.9*  ALBUMIN 2.6* 1.7*    Recent Labs Lab 01/20/16 1538  LIPASE 26   No results for input(s): AMMONIA in the last 168 hours. Coagulation Profile:  Recent Labs Lab 01/22/16 0020  INR 2.03*   Cardiac Enzymes:  Recent Labs Lab 01/21/16 2100 01/22/16 0020 01/22/16 0832 01/22/16 1230 01/22/16 1500  CKTOTAL  --  352*  --   --   --   CKMB  --  12.1*  --   --   --   TROPONINI 0.52* 0.79* 1.36* 1.52* 1.53*   BNP (last 3 results) No results for input(s):  PROBNP in the last 8760 hours. HbA1C: No results for input(s): HGBA1C in the last 72 hours. CBG:  Recent Labs Lab 01/24/16 2107 01/25/16 0111 01/25/16 0410 01/25/16 0806 01/25/16 1203  GLUCAP 93 98 89 92 148*   Lipid Profile: No results for input(s): CHOL, HDL, LDLCALC, TRIG, CHOLHDL, LDLDIRECT in the last 72 hours. Thyroid Function Tests: No results for input(s): TSH, T4TOTAL, FREET4, T3FREE, THYROIDAB in the last 72 hours. Anemia Panel: No results for input(s): VITAMINB12, FOLATE, FERRITIN, TIBC, IRON, RETICCTPCT in the last 72 hours. Sepsis Labs:  Recent Labs Lab 01/20/16 1648 01/20/16 1939 01/21/16 0130 01/21/16 0512 01/21/16 0843 01/22/16 0400 01/23/16 0400  PROCALCITON  --   --  >175.00 >175.00  --  >175.00 173.23  LATICACIDVEN 7.74* 5.90*  --  3.1* 3.0*  --   --     Recent Results (from the past 240 hour(s))  Blood Culture (routine x 2)     Status: Abnormal   Collection Time: 01/20/16  4:30 PM  Result Value Ref Range Status   Specimen Description BLOOD RIGHT HAND  Final   Special Requests BOTTLES DRAWN AEROBIC ONLY 5CC  Final   Culture  Setup Time   Final    GRAM NEGATIVE RODS AEROBIC BOTTLE ONLY CRITICAL RESULT CALLED TO, READ BACK BY AND VERIFIED WITH: K COMBS,PHARMD AT 0743 01/21/16 BY L BENFIELD    Culture (A)  Final    PROTEUS MIRABILIS SUSCEPTIBILITIES PERFORMED ON PREVIOUS CULTURE WITHIN THE LAST 5 DAYS.    Report Status 01/23/2016 FINAL  Final  Blood Culture (routine x 2)     Status: Abnormal   Collection Time: 01/20/16  4:40 PM  Result Value Ref Range Status   Specimen Description BLOOD LEFT ANTECUBITAL  Final   Special Requests BOTTLES DRAWN AEROBIC AND ANAEROBIC 5CC  Final   Culture  Setup Time   Final    GRAM NEGATIVE RODS IN BOTH AEROBIC AND ANAEROBIC BOTTLES CRITICAL RESULT CALLED TO, READ BACK BY AND VERIFIED WITH: K COMBS,PHARMD AT 0743 01/21/16 BY L BENFIELD    Culture PROTEUS MIRABILIS (A)  Final   Report Status 01/23/2016 FINAL   Final   Organism ID, Bacteria PROTEUS MIRABILIS  Final      Susceptibility   Proteus mirabilis - MIC*    AMPICILLIN <=2 SENSITIVE Sensitive     CEFAZOLIN <=4 SENSITIVE Sensitive     CEFEPIME <=1 SENSITIVE Sensitive     CEFTAZIDIME <=1 SENSITIVE Sensitive  CEFTRIAXONE <=1 SENSITIVE Sensitive     CIPROFLOXACIN <=0.25 SENSITIVE Sensitive     GENTAMICIN <=1 SENSITIVE Sensitive     IMIPENEM 2 SENSITIVE Sensitive     TRIMETH/SULFA <=20 SENSITIVE Sensitive     AMPICILLIN/SULBACTAM <=2 SENSITIVE Sensitive     PIP/TAZO <=4 SENSITIVE Sensitive     * PROTEUS MIRABILIS  Blood Culture ID Panel (Reflexed)     Status: Abnormal   Collection Time: 01/20/16  4:40 PM  Result Value Ref Range Status   Enterococcus species NOT DETECTED NOT DETECTED Final   Vancomycin resistance NOT DETECTED NOT DETECTED Final   Listeria monocytogenes NOT DETECTED NOT DETECTED Final   Staphylococcus species NOT DETECTED NOT DETECTED Final   Staphylococcus aureus NOT DETECTED NOT DETECTED Final   Methicillin resistance NOT DETECTED NOT DETECTED Final   Streptococcus species NOT DETECTED NOT DETECTED Final   Streptococcus agalactiae NOT DETECTED NOT DETECTED Final   Streptococcus pneumoniae NOT DETECTED NOT DETECTED Final   Streptococcus pyogenes NOT DETECTED NOT DETECTED Final   Acinetobacter baumannii NOT DETECTED NOT DETECTED Final   Enterobacteriaceae species DETECTED (A) NOT DETECTED Final    Comment: CRITICAL RESULT CALLED TO, READ BACK BY AND VERIFIED WITH: K COMBS,PHARMD AT 0743 01/21/16 BY L BENFIELD    Enterobacter cloacae complex NOT DETECTED NOT DETECTED Final   Escherichia coli NOT DETECTED NOT DETECTED Final   Klebsiella oxytoca NOT DETECTED NOT DETECTED Final   Klebsiella pneumoniae NOT DETECTED NOT DETECTED Final   Proteus species DETECTED (A) NOT DETECTED Final    Comment: CRITICAL RESULT CALLED TO, READ BACK BY AND VERIFIED WITH: K COMBS,PHARMD AT 0743 01/21/16 BY L BENFIELD    Serratia marcescens  NOT DETECTED NOT DETECTED Final   Carbapenem resistance NOT DETECTED NOT DETECTED Final   Haemophilus influenzae NOT DETECTED NOT DETECTED Final   Neisseria meningitidis NOT DETECTED NOT DETECTED Final   Pseudomonas aeruginosa NOT DETECTED NOT DETECTED Final   Candida albicans NOT DETECTED NOT DETECTED Final   Candida glabrata NOT DETECTED NOT DETECTED Final   Candida krusei NOT DETECTED NOT DETECTED Final   Candida parapsilosis NOT DETECTED NOT DETECTED Final   Candida tropicalis NOT DETECTED NOT DETECTED Final  Urine culture     Status: Abnormal   Collection Time: 01/20/16  6:32 PM  Result Value Ref Range Status   Specimen Description URINE, CATHETERIZED  Final   Special Requests NONE  Final   Culture 7,000 COLONIES/mL INSIGNIFICANT GROWTH (A)  Final   Report Status 01/22/2016 FINAL  Final  Urine culture     Status: Abnormal   Collection Time: 01/20/16 10:06 PM  Result Value Ref Range Status   Specimen Description URINE, CATHETERIZED  Final   Special Requests RIGHT KIDNEY  Final   Culture 10,000 COLONIES/mL PROTEUS MIRABILIS (A)  Final   Report Status 01/23/2016 FINAL  Final   Organism ID, Bacteria PROTEUS MIRABILIS (A)  Final      Susceptibility   Proteus mirabilis - MIC*    AMPICILLIN <=2 SENSITIVE Sensitive     CEFAZOLIN <=4 SENSITIVE Sensitive     CEFTRIAXONE <=1 SENSITIVE Sensitive     CIPROFLOXACIN <=0.25 SENSITIVE Sensitive     GENTAMICIN <=1 SENSITIVE Sensitive     IMIPENEM 1 SENSITIVE Sensitive     NITROFURANTOIN 128 RESISTANT Resistant     TRIMETH/SULFA <=20 SENSITIVE Sensitive     AMPICILLIN/SULBACTAM <=2 SENSITIVE Sensitive     PIP/TAZO <=4 SENSITIVE Sensitive     * 10,000 COLONIES/mL PROTEUS MIRABILIS  MRSA  PCR Screening     Status: None   Collection Time: 01/21/16  1:00 AM  Result Value Ref Range Status   MRSA by PCR NEGATIVE NEGATIVE Final    Comment:        The GeneXpert MRSA Assay (FDA approved for NASAL specimens only), is one component of  a comprehensive MRSA colonization surveillance program. It is not intended to diagnose MRSA infection nor to guide or monitor treatment for MRSA infections.   Culture, blood (Routine X 2) w Reflex to ID Panel     Status: None (Preliminary result)   Collection Time: 01/23/16 11:10 AM  Result Value Ref Range Status   Specimen Description BLOOD LEFT HAND  Final   Special Requests IN PEDIATRIC BOTTLE 1 CC  Final   Culture NO GROWTH 1 DAY  Final   Report Status PENDING  Incomplete  Culture, blood (Routine X 2) w Reflex to ID Panel     Status: None (Preliminary result)   Collection Time: 01/23/16 11:11 AM  Result Value Ref Range Status   Specimen Description BLOOD RIGHT HAND  Final   Special Requests IN PEDIATRIC BOTTLE 3 CC  Final   Culture NO GROWTH 1 DAY  Final   Report Status PENDING  Incomplete         Radiology Studies: No results found.      Scheduled Meds: . ampicillin (OMNIPEN) IV  1 g Intravenous Q6H  . antiseptic oral rinse  7 mL Mouth Rinse q12n4p  . carvedilol  3.125 mg Oral BID WC  . chlorhexidine  15 mL Mouth Rinse BID  . feeding supplement (ENSURE ENLIVE)  237 mL Oral TID BM  . insulin aspart  0-9 Units Subcutaneous Q4H  . multivitamin with minerals  1 tablet Oral Daily   Continuous Infusions: . heparin 1,200 Units/hr (01/25/16 0544)     LOS: 5 days    Time spent: 32 minutes    Durrell Barajas, MD [facility] Pager [phone]  If 7PM-7AM, please contact night-coverage [email or web address] Password [email or web address] 01/25/2016, 12:12 PM

## 2016-01-25 NOTE — NC FL2 (Signed)
[place] LEVEL OF CARE SCREENING TOOL     IDENTIFICATION  Patient Name: [name] Birthdate: 1941/07/13 Sex: female Admission Date (Current Location): 01/20/2016  [facility] and [place] Number:  Herbalist and Address:  The . [facility], [place] [address], [place], [place] [address]      Provider Number: [id number]  Attending Physician Name and Address:  Eugenie Filler, MD  Relative Name and Phone Number:       Current Level of Care: Hospital Recommended Level of Care: [place] Prior Approval Number:    Date Approved/Denied:   PASRR Number: [id number]  Discharge Plan: SNF    Current Diagnoses: Patient Active Problem List   Diagnosis Date Noted  . Sepsis ([place])   . Elevated troponin 01/23/2016  . Abnormal echocardiogram 01/23/2016  . Hypoxia   . Pressure ulcer 01/21/2016  . Septic shock ([place]) 01/20/2016  . Pyonephrosis   . Hypotension 12/28/2015  . Open knee wound 11/16/2015  . Wound dehiscence, surgical 10/18/2015  . Septic arthritis of knee, left ([place]) 10/12/2015  . Total knee replacement status 09/28/2015  . Bruit 07/10/2012  . Leukocytosis 09/19/2011  . Encephalopathy 09/19/2011  . AAA 01/19/2009  . ILIAC ARTERY ANEURYSM 01/19/2009  . Hyperlipidemia 01/14/2009  . TOBACCO ABUSE 01/14/2009  . Essential hypertension 01/14/2009  . Coronary atherosclerosis 01/14/2009  . Cerebrovascular disease 01/14/2009  . Peripheral vascular disease ([place]) 01/14/2009  . CHRONIC OBSTRUCTIVE PULMONARY DISEASE 01/14/2009  . CHEST PAIN-UNSPECIFIED 01/14/2009    Orientation RESPIRATION BLADDER Height & Weight     Self, Time, Situation, Place  Please see DC summary Incontinent Weight: 53.5 kg (117 lb 15.1 oz) Height:  '5\' 2"'$  (157.5 cm)  BEHAVIORAL SYMPTOMS/MOOD NEUROLOGICAL BOWEL NUTRITION STATUS      Continent Diet (soft)  AMBULATORY STATUS COMMUNICATION OF NEEDS Skin   Extensive Assist Verbally PU Stage and  Appropriate Care (DTI- left heel blister)   PU Stage 2 Dressing:  (upper lumber)                   Personal Care Assistance Level of Assistance  Bathing, Dressing Bathing Assistance: Maximum assistance   Dressing Assistance: Maximum assistance     Functional Limitations Info             SPECIAL CARE FACTORS FREQUENCY  PT (By licensed PT), OT (By licensed OT)     PT Frequency: 5/wk OT Frequency: 5/wk            Contractures      Additional Factors Info  Code Status, Allergies, Isolation Precautions Code Status Info: FULL Allergies Info: NKA     Isolation Precautions Info: MRSA     Current Medications (01/25/2016):  This is the current hospital active medication list Current Facility-Administered Medications  Medication Dose Route Frequency Provider Last Rate Last Dose  . 0.9 %  sodium chloride infusion  250 mL Intravenous PRN Raylene Miyamoto, MD      . acetaminophen (TYLENOL) tablet 650 mg  650 mg Oral Q6H PRN Javier Glazier, MD   650 mg at 01/23/16 2004  . ampicillin (OMNIPEN) 1 g in sodium chloride 0.9 % 50 mL IVPB  1 g Intravenous Q6H Kelvin Cellar, MD   1 g at 01/25/16 0544  . antiseptic oral rinse (CPC / CETYLPYRIDINIUM CHLORIDE 0.05%) solution 7 mL  7 mL Mouth Rinse q12n4p Javier Glazier, MD   7 mL at 01/24/16 1600  . carvedilol (COREG) tablet  3.125 mg  3.125 mg Oral BID WC Sherren Mocha, MD   3.125 mg at 01/25/16 0753  . chlorhexidine (PERIDEX) 0.12 % solution 15 mL  15 mL Mouth Rinse BID Javier Glazier, MD   15 mL at 01/24/16 0946  . docusate sodium (COLACE) capsule 100 mg  100 mg Oral BID PRN Kelvin Cellar, MD      . feeding supplement (ENSURE ENLIVE) (ENSURE ENLIVE) liquid 237 mL  237 mL Oral TID BM Kelvin Cellar, MD   237 mL at 01/24/16 2000  . heparin ADULT infusion 100 units/mL (25000 units/288m sodium chloride 0.45%)  1,200 Units/hr Intravenous Continuous JErenest Blank RPH 12 mL/hr at 01/25/16 0544 1,200 Units/hr at 01/25/16  0544  . insulin aspart (novoLOG) injection 0-9 Units  0-9 Units Subcutaneous Q4H DRaylene Miyamoto MD   1 Units at 01/22/16 [id number] . morphine 2 MG/ML injection 2 mg  2 mg Intravenous Q1H PRN OGuy Begin MD   2 mg at 01/23/16 0507  . multivitamin with minerals tablet 1 tablet  1 tablet Oral Daily EKelvin Cellar MD   1 tablet at 01/24/16 1717  . ondansetron (ZOFRAN) injection 4 mg  4 mg Intravenous Q6H PRN DRaylene Miyamoto MD         Discharge Medications: Please see discharge summary for a list of discharge medications.  Relevant Imaging Results:  Relevant Lab Results:   Additional Information SSN: [id number] [place]

## 2016-01-25 NOTE — Clinical Social Work Note (Signed)
Clinical Social Work Assessment  Patient Details  Name: [name] MRN: [id number] Date of Birth: 03/19/1941  Date of referral:  01/25/16               Reason for consult:  Facility Placement                Permission sought to share information with:  Family Supports Permission granted to share information::  Yes, Verbal Permission Granted  Name::        Agency::  [facility] SNFs  Relationship::  dtr-in-law  Contact Information:     Housing/Transportation Living arrangements for the past 2 months:  [place] of Information:  Adult Children (dtr-in-law) Patient Interpreter Needed:  None Criminal Activity/Legal Involvement Pertinent to Current Situation/Hospitalization:  No - Comment as needed Significant Relationships:  Adult Children Lives with:  Adult Children (past 2 months) Do you feel safe going back to the place where you live?  No Need for family participation in patient care:  Yes (Comment) (needing help with ADLs at home prior to admission)  Care giving concerns:  Pt has been living with son and dtr-in-law for past 2 months - they are unable to care for patient with currently level of assist.   Facilities manager / plan:  CSW spoke with pt and pt dtr-in-law at bedside.  Pt was lethargic- became alert when addressed but did not speak.  CSW discussed PT recommendation for SNF at time of DC.  Family is agreeable to this plan and has strong preference for [facility] where pt has been in the past.  Employment status:  Part-Time (prior to march) Insurance information:  Medicare PT Recommendations:  [place] / Referral to community resources:  [place]  Patient/Family's Response to care: Pt family is agreeable to recommendation for SNF.  Patient/Family's Understanding of and Emotional Response to Diagnosis, Current Treatment, and Prognosis:  Family is hopeful pt can regain some strength- are  disheartened that a few months a go patient was capable of living independently and working part time and now she is needing such high level of medical care.  Emotional Assessment Appearance:  Appears stated age Attitude/Demeanor/Rapport:    Affect (typically observed):  Appropriate Orientation:  Oriented to Self Alcohol / Substance use:  Not Applicable Psych involvement (Current and /or in the community):  No (Comment)  Discharge Needs  Concerns to be addressed:  Care Coordination Readmission within the last 30 days:  Yes Current discharge risk:  Physical Impairment Barriers to Discharge:  Continued Medical Work up   [employer], LCSW 01/25/2016, 10:44 AM

## 2016-01-25 NOTE — Care Management Important Message (Signed)
Important Message  Patient Details  Name: [name] MRN: [id number] Date of Birth: 26-Sep-1940   Medicare Important Message Given:  Yes    Loann Quill 01/25/2016, 10:13 AM

## 2016-01-25 NOTE — Progress Notes (Signed)
Plastic Surgery Follow up Subjective: Patient seen about 9:00 am this am with nursing and daughter in law at bedside.  Patient remains somewhat disoriented this am.   The right knee wound is stable with pink granulation present. Dressing with surgical lubricant and gauze and ABD applied, then kerlix, and Ace wrapping. The knee immobilizer was reapplied and she can be up WBAT in the immobilizer with PT. (Splint was removed as it was soiled and seemed to be causing patient some discomfort)   Objective: Vital signs in last 24 hours: Temp:  [98.3 F (36.8 C)-99.1 F (37.3 C)] 98.3 F (36.8 C) (06/28 0552) Pulse Rate:  [92-96] 96 (06/28 0552) Resp:  [17-18] 18 (06/28 0836) BP: (122-131)/(72-76) 122/72 mmHg (06/28 0552) SpO2:  [95 %-96 %] 95 % (06/28 0552) Weight:  [53.5 kg (117 lb 15.1 oz)] 53.5 kg (117 lb 15.1 oz) (06/28 0552) Last BM Date: 01/25/16  Intake/Output from previous day: 06/27 0701 - 06/28 0700 In: 350 [P.O.:300; IV Piggyback:50] Out: 550 [Urine:550] Intake/Output this shift: Total I/O In: 60 [P.O.:60] Out: -   General appearance: cooperative, mild distress and slowed mentation m right knee wound - pink granulation  Lab Results:   Recent Labs  01/24/16 0342 01/25/16 0540  WBC 34.8* 20.3*  HGB 8.2* 8.5*  HCT 25.5* 26.0*  PLT 77* 80*   BMET  Recent Labs  01/24/16 0342 01/25/16 0540  NA 141 143  K 2.3* 3.8  CL 103 107  CO2 28 24  GLUCOSE 99 85  BUN 29* 19  CREATININE 0.95 0.72  CALCIUM 8.3* 8.4*   PT/INR No results for input(s): LABPROT, INR in the last 72 hours. ABG No results for input(s): PHART, HCO3 in the last 72 hours.  Invalid input(s): PCO2, PO2  Studies/Results: No results found.  Anti-infectives: Anti-infectives    Start     Dose/Rate Route Frequency Ordered Stop   01/24/16 1800  ampicillin (OMNIPEN) 1 g in sodium chloride 0.9 % 50 mL IVPB     1 g 150 mL/hr over 20 Minutes Intravenous Every 6 hours 01/24/16 1453     01/23/16  1030  cefTRIAXone (ROCEPHIN) 2 g in dextrose 5 % 50 mL IVPB  Status:  Discontinued     2 g 100 mL/hr over 30 Minutes Intravenous Every 24 hours 01/23/16 0953 01/24/16 1453   01/21/16 2100  vancomycin (VANCOCIN) 500 mg in sodium chloride 0.9 % 100 mL IVPB  Status:  Discontinued     500 mg 100 mL/hr over 60 Minutes Intravenous Every 24 hours 01/20/16 2045 01/22/16 1044   01/21/16 2100  cefTAZidime (FORTAZ) 1 g in dextrose 5 % 50 mL IVPB  Status:  Discontinued     1 g 100 mL/hr over 30 Minutes Intravenous Every 24 hours 01/20/16 2045 01/21/16 1800   01/21/16 1815  cefTAZidime (FORTAZ) 2 g in dextrose 5 % 50 mL IVPB  Status:  Discontinued     2 g 100 mL/hr over 30 Minutes Intravenous Every 12 hours 01/21/16 1800 01/23/16 0953   01/21/16 1700  cefTRIAXone (ROCEPHIN) 1 g in dextrose 5 % 50 mL IVPB  Status:  Discontinued     1 g 100 mL/hr over 30 Minutes Intravenous Every 24 hours 01/20/16 1615 01/20/16 2036   01/20/16 2045  vancomycin (VANCOCIN) IVPB 1000 mg/200 mL premix  Status:  Discontinued     1,000 mg 200 mL/hr over 60 Minutes Intravenous  Once 01/20/16 2040 01/22/16 1000   01/20/16 2045  cefTAZidime (FORTAZ) 2 g  in dextrose 5 % 50 mL IVPB     2 g 100 mL/hr over 30 Minutes Intravenous  Once 01/20/16 2040 01/20/16 2140   01/20/16 1745  azithromycin (ZITHROMAX) 500 mg in dextrose 5 % 250 mL IVPB     500 mg 250 mL/hr over 60 Minutes Intravenous  Once 01/20/16 1734 01/20/16 1948   01/20/16 1615  cefTRIAXone (ROCEPHIN) 2 g in dextrose 5 % 50 mL IVPB     2 g 100 mL/hr over 30 Minutes Intravenous  Once 01/20/16 1611 01/20/16 1753      Assessment/Plan: Right knee wound- Continue daily dressing changes with surgical lubricant, gauze, ABD pad and kerlix, Ace wrap and knee immobilizer. May WBAT with PT.  Will plan to see in the office in follow up.   LOS: 5 days    RAYBURN,SHAWN,PA-C Plastic Surgery [phone]

## 2016-01-25 NOTE — Progress Notes (Signed)
ANTICOAGULATION CONSULT NOTE - Follow Up Consult  Pharmacy Consult for heparin Indication: chest pain/ACS  No Known Allergies  Patient Measurements: Height: '5\' 2"'$  (157.5 cm) Weight: 117 lb 15.1 oz (53.5 kg) IBW/kg (Calculated) : 50.1 Heparin Dosing Weight: 51.9 kg  Vital Signs: Temp: 98.3 F (36.8 C) (06/28 0552) BP: 122/72 mmHg (06/28 0552) Pulse Rate: 96 (06/28 0552)  Labs:  Recent Labs  01/22/16 1230  01/22/16 1500  01/23/16 0400  01/24/16 0342 01/24/16 1159 01/24/16 2030 01/25/16 0540  HGB  --   --   --   < > 8.1*  --  8.2*  --   --  8.5*  HCT  --   --   --   < > 25.5*  --  25.5*  --   --  26.0*  PLT  --   --   --   --  89*  --  77*  --   --  80*  HEPARINUNFRC  --   --   --   < > 0.24*  < > 0.27* 0.48 0.43 0.36  CREATININE  --   < > 0.97  --  1.07*  --  0.95  --   --  0.72  TROPONINI 1.52*  --  1.53*  --   --   --   --   --   --   --   < > = values in this interval not displayed.  Estimated Creatinine Clearance: 48.8 mL/min (by C-G formula based on Cr of 0.72).  Assessment: 75 yo female with elevated troponins possibly from demand ischemia. Cardiology is planning to reassess LVEF on Thursday to assess for stress-induced cardiomyopathy.  Remains on IV heparin for now. Level remains therapeutic on 1200 units/hr  Hgb 8.5, plts 80- stable, no overt bleeding noted. Platelet drop possibly from sepsis/acute illness. Cont to follow.  Goal of Therapy:  Heparin level 0.3-0.7 units/ml Monitor platelets by anticoagulation protocol: Yes   Plan:  -heparin 1200 units/hr -Daily HL, CBC -follow cardiology plans for continuing IV heparin  Etoile Looman D. Dekendrick Uzelac, PharmD, BCPS Clinical Pharmacist Pager: [phone] 01/25/2016 9:08 AM

## 2016-01-26 ENCOUNTER — Inpatient Hospital Stay (HOSPITAL_COMMUNITY): Payer: Medicare Other

## 2016-01-26 DIAGNOSIS — I509 Heart failure, unspecified: Secondary | ICD-10-CM

## 2016-01-26 LAB — ECHOCARDIOGRAM LIMITED
CHL CUP DOP CALC LVOT VTI: 23.3 cm
CHL CUP STROKE VOLUME: 24 mL
EWDT: 158 ms
FS: 17 % — AB (ref 28–44)
HEIGHTINCHES: 62 in
IVS/LV PW RATIO, ED: 0.97
LADIAMINDEX: 1.81 cm/m2
LASIZE: 28 mm
LEFT ATRIUM END SYS DIAM: 28 mm
LV SIMPSON'S DISK: 39
LV dias vol index: 39 mL/m2
LV sys vol index: 24 mL/m2
LVDIAVOL: 61 mL (ref 46–106)
LVOT peak grad rest: 7 mmHg
LVOT peak vel: 130 cm/s
LVSYSVOL: 37 mL (ref 14–42)
MV Dec: 158
MV Peak grad: 4 mmHg
MVPKAVEL: 106 m/s
MVPKEVEL: 97 m/s
P 1/2 time: 275 ms
PW: 7.73 mm — AB (ref 0.6–1.1)
WEIGHTICAEL: 1915.36 [oz_av]

## 2016-01-26 LAB — BASIC METABOLIC PANEL
ANION GAP: 7 (ref 5–15)
BUN: 10 mg/dL (ref 6–20)
CO2: 26 mmol/L (ref 22–32)
Calcium: 8 mg/dL — ABNORMAL LOW (ref 8.9–10.3)
Chloride: 107 mmol/L (ref 101–111)
Creatinine, Ser: 0.59 mg/dL (ref 0.44–1.00)
GFR calc Af Amer: >60 mL/min (ref >60)
GFR calc non Af Amer: >60 mL/min (ref >60)
GLUCOSE: 91 mg/dL (ref 65–99)
POTASSIUM: 3.3 mmol/L — AB (ref 3.5–5.1)
Sodium: 140 mmol/L (ref 135–145)

## 2016-01-26 LAB — GLUCOSE, CAPILLARY
GLUCOSE-CAPILLARY: 111 mg/dL — AB (ref 65–99)
GLUCOSE-CAPILLARY: 113 mg/dL — AB (ref 65–99)
GLUCOSE-CAPILLARY: 164 mg/dL — AB (ref 65–99)
Glucose-Capillary: 95 mg/dL (ref 65–99)
Glucose-Capillary: 96 mg/dL (ref 65–99)
Glucose-Capillary: 148 mg/dL — ABNORMAL HIGH (ref 65–99)

## 2016-01-26 MED ORDER — ENOXAPARIN SODIUM 40 MG/0.4ML ~~LOC~~ SOLN
40.0000 mg | SUBCUTANEOUS | Status: DC
  Administered 2016-01-26 – 2016-01-29 (×4): 40 mg via SUBCUTANEOUS
  Filled 2016-01-26 (×4): qty 0.4

## 2016-01-26 MED ORDER — AMOXICILLIN 500 MG PO CAPS
500.0000 mg | ORAL_CAPSULE | Freq: Three times a day (TID) | ORAL | Status: DC
  Administered 2016-01-26 – 2016-02-01 (×18): 500 mg via ORAL
  Filled 2016-01-26 (×20): qty 1

## 2016-01-26 MED ORDER — POTASSIUM CHLORIDE CRYS ER 20 MEQ PO TBCR
40.0000 meq | EXTENDED_RELEASE_TABLET | Freq: Once | ORAL | Status: AC
  Administered 2016-01-26: 40 meq via ORAL
  Filled 2016-01-26: qty 2

## 2016-01-26 MED ORDER — FUROSEMIDE 20 MG PO TABS
20.0000 mg | ORAL_TABLET | Freq: Every day | ORAL | Status: DC
  Administered 2016-01-26 – 2016-02-01 (×7): 20 mg via ORAL
  Filled 2016-01-26 (×7): qty 1

## 2016-01-26 NOTE — Progress Notes (Signed)
ANTICOAGULATION CONSULT NOTE - Follow Up Consult  Pharmacy Consult for heparin__DC heparin today & change to SQ Lovenox for VTE Prophylaxis Indication: changed to VTE Prophylaxis  No Known Allergies  Patient Measurements: Height: '5\' 2"'$  (157.5 cm) Weight: 119 lb 11.4 oz (54.3 kg) IBW/kg (Calculated) : 50.1 Heparin Dosing Weight: 51.9 kg  Vital Signs: Temp: 98.1 F (36.7 C) (06/29 0459) BP: 138/63 mmHg (06/29 0459) Pulse Rate: 63 (06/29 0825)  Labs:  Recent Labs  01/24/16 0342 01/24/16 1159 01/24/16 2030 01/25/16 0540 01/26/16 0759  HGB 8.2*  --   --  8.5*  --   HCT 25.5*  --   --  26.0*  --   PLT 77*  --   --  80*  --   HEPARINUNFRC 0.27* 0.48 0.43 0.36  --   CREATININE 0.95  --   --  0.72 0.59    Estimated Creatinine Clearance: 48.8 mL/min (by C-G formula based on Cr of 0.59).  Assessment: 75 yo female with elevated troponins possibly from demand ischemia. Cardiology is planning to reassess LVEFtoday to assess for stress-induced cardiomyopathy.  Remains on IV heparin for now. Level remains therapeutic on 1200 units/hr  CBC on 6/28: Hgb 8.5, plts 80- stable, no overt bleeding noted. Platelet drop possibly from sepsis/acute illness. No CBC done today. SCr today stable at 0.59  Goal of Therapy:  Heparin level 0.3-0.7 units/ml Monitor platelets by anticoagulation protocol: Yes   Plan:  -heparin IV drip DC'd this AM 01/26/16 Start Lovenox '40mg'$  SQ q24h for VTE prophylaxis   Nicole Cella, RPh Clinical Pharmacist Pager: [phone] 01/26/2016 9:36 AM

## 2016-01-26 NOTE — Progress Notes (Addendum)
    Subjective:  Sitting up eating breakfast this morning. No CP or dyspnea.   Objective:  Vital Signs in the last 24 hours: Temp:  [98.1 F (36.7 C)-98.4 F (36.9 C)] 98.1 F (36.7 C) (06/29 0459) Pulse Rate:  [63-92] 63 (06/29 0825) Resp:  [15-18] 15 (06/29 0825) BP: (122-138)/(41-63) 138/63 mmHg (06/29 0459) SpO2:  [94 %-98 %] 95 % (06/29 0825) Weight:  [119 lb 11.4 oz (54.3 kg)] 119 lb 11.4 oz (54.3 kg) (06/29 0459)  Intake/Output from previous day: 06/28 0701 - 06/29 0700 In: 180 [P.O.:180] Out: 825 [Urine:825]  Physical Exam: Pt is alert, pleasant, NAD HEENT: normal Neck: JVP - normal Lungs: CTA bilaterally CV: RRR with 2/6 systolic murmur at the RUSB Abd: soft, NT, Positive BS, no hepatomegaly Ext: no C/C/E on right, left leg wrapped Skin: warm/dry no rash   Lab Results:  Recent Labs  01/24/16 0342 01/25/16 0540  WBC 34.8* 20.3*  HGB 8.2* 8.5*  PLT 77* 80*    Recent Labs  01/25/16 0540 01/26/16 0759  NA 143 140  K 3.8 3.3*  CL 107 107  CO2 24 26  GLUCOSE 85 91  BUN 19 10  CREATININE 0.72 0.59   No results for input(s): TROPONINI in the last 72 hours.  Invalid input(s): CK, MB  Cardiac Studies: FU 2D Echo pending  Tele: personally reviewed: normal sinus  Assessment/Plan:  1. Acute systolic HF: stable, no congestive symptoms. Continue carvedilol. FU echo today. Will start ACE-I tomorrow if LVEF remains depressed.  2. Demand ischemia: stop heparin today. DVT dose Lovenox written. Await FU echo.  Sherren Mocha, M.D. 01/26/2016, 9:14 AM

## 2016-01-26 NOTE — Progress Notes (Signed)
Physical Therapy Treatment Patient Details Name: [name] MRN: [id number] DOB: 1941/05/24 Today's Date: 01/26/2016    History of Present Illness pt is a 75 y/o female with pmh of CAD, HTN, CVD, PVD, MRSA, THR's R, TKA L with infection, I and D with poly exchange, skin grafting to L knee, admitted with 2 wk h/o crampy abdominal pain, frequency, urgency, dysuria and onset of n/v day of admit.  Working dx's--septic shock, ARF and obstructing uropathy, s/p right percutaneous nephostomy.    PT Comments    Patient progressing with short distance ambulation this session.  Still limited with activity tolerance, but balance improved during session as well as ability to take steps more independently.  Continue to recommend  SNF level rehab at d/c.   Follow Up Recommendations  SNF     Equipment Recommendations  Other (comment) (TBA)    Recommendations for Other Services       Precautions / Restrictions Precautions Precautions: Fall Required Braces or Orthoses: Knee Immobilizer - Left Knee Immobilizer - Left: On at all times Restrictions Weight Bearing Restrictions: Yes LLE Weight Bearing: Weight bearing as tolerated    Mobility  Bed Mobility               General bed mobility comments: pt up in chair  Transfers Overall transfer level: Needs assistance Equipment used: Rolling walker (2 wheeled) Transfers: Sit to/from Stand Sit to Stand: Mod assist;+2 physical assistance         General transfer comment: increased time to get out to edge of chair with assist, cues and assist for hand placement assist with pad under pt to come upright  Ambulation/Gait Ambulation/Gait assistance: Mod assist;+2 physical assistance;Max assist Ambulation Distance (Feet): 5 Feet Assistive device: Rolling walker (2 wheeled) Gait Pattern/deviations: Decreased stride length;Step-through pattern;Wide base of support;Antalgic     General Gait Details: initially assist for progressing L LE,  assist for unweighting L to step with R, assist for balance and walker progression, very slow and weak with increased time for steps   Stairs            Wheelchair Mobility    Modified Rankin (Stroke Patients Only)       Balance Overall balance assessment: Needs assistance   Sitting balance-Leahy Scale: Poor Sitting balance - Comments: assist to come forward from back of chair UE support in sitting with S   Standing balance support: Bilateral upper extremity supported Standing balance-Leahy Scale: Poor Standing balance comment: initially max support for balance with UE support on walker due to posterior bias, then with cues for posture and support lessened to mod A                    Cognition Arousal/Alertness: Awake/alert Behavior During Therapy: WFL for tasks assessed/performed Overall Cognitive Status: Impaired/Different from baseline Area of Impairment: Problem solving;Following commands       Following Commands: Follows multi-step commands with increased time     Problem Solving: Slow processing;Decreased initiation;Requires verbal cues;Requires tactile cues General Comments: seems to be slowly clearing    Exercises General Exercises - Lower Extremity Ankle Circles/Pumps: AROM;10 reps;Both;Seated Heel Slides: AAROM;Right;10 reps;Seated    General Comments        Pertinent Vitals/Pain Faces Pain Scale: Hurts little more Pain Location: L LE Pain Descriptors / Indicators: Grimacing;Guarding Pain Intervention(s): Repositioned;Monitored during session;Limited activity within patient's tolerance    Home Living  Prior Function            PT Goals (current goals can now be found in the care plan section) Progress towards PT goals: Progressing toward goals    Frequency  Min 3X/week    PT Plan Current plan remains appropriate    Co-evaluation PT/OT/SLP Co-Evaluation/Treatment: Yes Reason for Co-Treatment: For  patient/therapist safety;Complexity of the patient's impairments (multi-system involvement) PT goals addressed during session: Balance;Mobility/safety with mobility;Strengthening/ROM       End of Session Equipment Utilized During Treatment: Gait belt;Left knee immobilizer Activity Tolerance: Patient limited by fatigue Patient left: in chair;with call bell/phone within reach;with chair alarm set;with family/visitor present     Time: 1133-1200 PT Time Calculation (min) (ACUTE ONLY): 27 min  Charges:  $Therapeutic Activity: 8-22 mins                    G Codes:      Reginia Naas February 10, 2016, 1:30 PM  Magda Kiel, [place] 10-Feb-2016

## 2016-01-26 NOTE — Progress Notes (Signed)
Occupational Therapy Evaluation Patient Details Name: [name] MRN: [id number] DOB: Feb 27, 1941 Today's Date: 01/26/2016    History of Present Illness pt is a 75 y/o female with pmh of CAD, HTN, CVD, PVD, MRSA, THR's R, TKA L with infection, I and D with poly exchange, skin grafting to L knee, admitted with 2 wk h/o crampy abdominal pain, frequency, urgency, dysuria and onset of n/v day of admit.  Working dx's--septic shock, ARF and obstructing uropathy, s/p right percutaneous nephostomy.   Clinical Impression   PTA, pt has not been ambulatory due to L TKA and required assistance for all ADLs. Pt currently presents with generalized weakness, confusion, and acute LLE pain and required mod +2 assist for LB ADLs and all transfers. Pt will benefit from continued acute OT to increase independence and safety with ADLs and mobility to allow for safe discharge to the venue listed below. Recommend SNF for post-acute rehab stay. Will continue to follow acutely.   Follow Up Recommendations  SNF;Supervision/Assistance - 24 hour    Equipment Recommendations  Other (comment) (TBD at next venue)    Recommendations for Other Services       Precautions / Restrictions Precautions Precautions: Fall Required Braces or Orthoses: Knee Immobilizer - Left Knee Immobilizer - Left: On at all times Restrictions Weight Bearing Restrictions: Yes LLE Weight Bearing: Weight bearing as tolerated      Mobility Bed Mobility               General bed mobility comments: Pt up in chair on OT arrival  Transfers Overall transfer level: Needs assistance Equipment used: Rolling walker (2 wheeled) Transfers: Sit to/from Stand Sit to Stand: Mod assist;+2 physical assistance         General transfer comment: Assist to scoot hips to edge of chair, boost to stand and to gain balance upon standing. Pt with severe posterior bias initially requiring max assist to maintain balance. VCs for hand  placement.    Balance Overall balance assessment: Needs assistance Sitting-balance support: No upper extremity supported;Feet supported Sitting balance-Leahy Scale: Poor Sitting balance - Comments: assist to lean forward from back of chair and required bilateral UE support sitting Postural control: Posterior lean Standing balance support: Bilateral upper extremity supported;During functional activity Standing balance-Leahy Scale: Poor Standing balance comment: Initially max assist to gain balance with BUE support on RW due to posterior bias. Eventually lessened to mod assist with multimodal cues for posture.                            ADL Overall ADL's : Needs assistance/impaired     Grooming: Wash/dry hands;Wash/dry face;Set up;Sitting                                       Vision Vision Assessment?: No apparent visual deficits   Perception     Praxis      Pertinent Vitals/Pain Pain Assessment: Faces Faces Pain Scale: Hurts little more Pain Location: LLE Pain Descriptors / Indicators: Grimacing;Guarding Pain Intervention(s): Limited activity within patient's tolerance;Monitored during session;Repositioned     Hand Dominance     Extremity/Trunk Assessment Upper Extremity Assessment Upper Extremity Assessment: Generalized weakness   Lower Extremity Assessment Lower Extremity Assessment: Generalized weakness;LLE deficits/detail LLE Deficits / Details: decreased ROM and strength due to TKA   Cervical / Trunk Assessment Cervical / Trunk Assessment: Kyphotic  Communication     Cognition Arousal/Alertness: Awake/alert Behavior During Therapy: WFL for tasks assessed/performed Overall Cognitive Status: Impaired/Different from baseline Area of Impairment: Problem solving;Following commands       Following Commands: Follows multi-step commands with increased time     Problem Solving: Slow processing;Decreased initiation;Requires verbal  cues;Requires tactile cues General Comments: seems to be slowly clearing   General Comments       Exercises       Shoulder Instructions      Home Living Family/patient expects to be discharged to:: Skilled nursing facility                                        Prior Functioning/Environment Level of Independence: Needs assistance  Gait / Transfers Assistance Needed: Non ambulatory at this time, per ortho doc only WBAT for SPT with assist. ADL's / Homemaking Assistance Needed: Able to help wash upper body but requires assist with LB.        OT Diagnosis: Generalized weakness;Cognitive deficits;Acute pain   OT Problem List: Decreased strength;Decreased range of motion;Decreased activity tolerance;Impaired balance (sitting and/or standing);Decreased coordination;Decreased safety awareness;Decreased cognition;Decreased knowledge of use of DME or AE;Decreased knowledge of precautions;Pain   OT Treatment/Interventions: Self-care/ADL training;Therapeutic exercise;DME and/or AE instruction;Energy conservation;Therapeutic activities;Cognitive remediation/compensation;Patient/family education;Balance training    OT Goals(Current goals can be found in the care plan section) Acute Rehab OT Goals Patient Stated Goal: To get more therapy and ultimately get home. OT Goal Formulation: With patient Time For Goal Achievement: 02/09/16 Potential to Achieve Goals: Good ADL Goals Pt Will Perform Grooming: with min assist;standing Pt Will Perform Upper Body Bathing: with set-up;sitting Pt Will Perform Lower Body Bathing: with mod assist;sit to/from stand Pt Will Transfer to Toilet: with mod assist;ambulating;bedside commode (over toilet) Pt Will Perform Toileting - Clothing Manipulation and hygiene: with mod assist;sit to/from stand  OT Frequency: Min 2X/week   Barriers to D/C:            Co-evaluation PT/OT/SLP Co-Evaluation/Treatment: Yes Reason for Co-Treatment:  Complexity of the patient's impairments (multi-system involvement);For patient/therapist safety PT goals addressed during session: Balance;Mobility/safety with mobility;Strengthening/ROM OT goals addressed during session: ADL's and self-care;Proper use of Adaptive equipment and DME      End of Session Equipment Utilized During Treatment: Gait belt;Rolling walker;Left knee immobilizer Nurse Communication: Mobility status  Activity Tolerance: Patient limited by fatigue Patient left: in chair;with call bell/phone within reach;with chair alarm set;with family/visitor present   Time: 1136-1200 OT Time Calculation (min): 24 min Charges:  OT General Charges $OT Visit: 1 Procedure OT Evaluation $OT Eval Moderate Complexity: 1 Procedure G-Codes:    Redmond Baseman, OTR/L Pager: [phone] 01/26/2016, 3:28 PM

## 2016-01-26 NOTE — Progress Notes (Signed)
CSW updated son on SNF bed offers. [facility] is not scheduled to have an open bed over the next few days. Son stated he would confer with family on a second SNF option.  Percell Locus Kirt Chew LCSWA [phone]

## 2016-01-26 NOTE — Progress Notes (Signed)
Echocardiogram 2D Echocardiogram has been performed.  [name] 01/26/2016, 4:44 PM

## 2016-01-26 NOTE — Progress Notes (Signed)
PROGRESS NOTE    [name]  VQQ:[id number] DOB: February 23, 1941 DOA: 01/20/2016 PCP: Kirk Ruths, MD   Brief Narrative:  [name] is a 75 year old female with a past medical history of a total left knee arthroplasty seizure performed on 09/28/2015 by Dr. Sharol Given of orthopedic surgery, was complicated by infection undergoing removal of hardware and placement of antibiotic beads on 10/12/2015, procedure performed by Dr. Sharol Given. Unfortunately she had dehiscence of left knee arthroplasty undergoing irrigation and debridement on 10/18/2015. She was admitted to the pulmonary critical care service on 01/20/2016 presenting with abdominal pain, dysuria, nausea, vomiting, found have a temperature of 103, hypotensive, and required pressor support. She was further worked up with CT scan of abdomen and pelvis that revealed obstructing UVJ stone with associated hydroureteronephrosis. Urology was consulted, she underwent nephrostomy tube placement. Urine cultures and blood cultures both growing Proteus mirabilis, with repeat blood cultures performed on 01/23/2016 showing no growth to date. She is on ceftriaxone 2 g IV every 24 hours. During this hospitalization she was seen by her orthopedic surgeon Dr Sharol Given who recommended at least 4 weeks of antibiotic coverage. On 01/24/2016 her care was transferred from [facility] to medicine.    Assessment & Plan:   Principal Problem:   Septic shock ([place]) Active Problems:   Pressure ulcer   Elevated troponin   Abnormal echocardiogram   Hypoxia   Sepsis (HCC)   Obstructive uropathy   Hypokalemia  1. Septic shock -Courtney. James is a 75 year old female she presented in shock likely secondary to urosepsis, requiring IV pressor support and ICU admission. -Source of infection likely pyelonephritis/UTI, as CT scan revealed evidence of obstructive uropathy. Radiology reported obstructing 2 mm right UVJ stone with associated hydronephrosis. -Both urine and blood cultures growing  Proteus -She is now hemodynamically stable off of pressor support and has been transferred to the medicine service. -Susceptibility testing showing organism is sensitive to beta lactams. WBC trending down and currently at 20.3 from 57.1. -Discontinued ceftriaxone and started with ampicillin -Repeat blood cultures drawn on 01/23/2016 show no growth after overnight incubation. Will transition to oral antimicrobial therapy with amoxicillin.   2. Obstructive uropathy. -She was admitted for urosepsis, with CT scan showing obstructing stone and right UVJ that was associated with hydronephrosis. -She underwent urostomy tube placement, which appears to be draining well. -Foley catheter remains in place -Urology was consulted during this hospitalization.  -Treating UTI with ampicillin IV. Change IV ampicillin to oral amoxicillin. Will likely need to follow-up with urology and interventional radiology as outpatient.  3. History of left total knee arthroplasty -Complicated by infection -During this hospitalization she was seen by plastics and orthopedic surgery -Source of sepsis likely to be urinary -Dr. Sharol Given recommending one month of antibiotic therapy  4. Hypokalemia. -A.m. lab work showing potassium of 2.3 on 01/25/2016, she had been on Lasix therapy. -Replaced with by mouth and IV potassium. Repeat labs with potassium of 3.8. Monitor closely with diuresis.  5. History of hypertension. -Continue Coreg 3.125 mg by mouth twice a day  5. Acute systolic congestive heart failure -Transthoracic echocardiogram showing EF of 25-30% -She is clinically euvolemic, had been on Lasix 40 mg IV twice a day -Replacing potassium -Cardiology following recommending repeating 2-D echo on 01/26/2016 with further recommendations to follow pending 2-D echo outcome. Patient off IV Lasix. Will place on Lasix 20 mg oral daily. Follow. Per cardiology.  6. Probable demand ischemia/Elevated troponin -Evaluated  by cardiology, lab work revealed peak troponin of 1.53 -Cardiology recommending  a repeat echo, if LVEF remains low may consider ischemic workup. -Remains on IV heparin     DVT prophylaxis: SCDs Code Status: Full Family Communication: Updated patient and daughter-in-law at bedside. Disposition Plan: Likely skilled nursing facility when medically stable.   Consultants:   Urology: Dr. Diona Fanti 01/20/2016  Interventional radiology Dr. Kathlene Cote 01/20/2016  Cardiology: Dr. Burt Knack 01/23/2016  Orthopedics: Dr. Sharol Given 01/24/2016  Plastic surgery  Procedures:   CT abdomen and pelvis 01/20/2016  Chest x-ray 01/20/2016, 01/21/2016, 01/22/2016  2-D echo 01/22/2016  Right percutaneous nephrostomy tube placement Dr. Kathlene Cote 01/20/2016  Antimicrobials:   IV Fortaz 01/20/2016>>>> 01/23/2016  IV Rocephin 01/23/2016>>>> 01/24/2016  IV vancomycin 01/20/2016>>>> 01/22/2016  IV ampicillin 01/24/2016>>>>> 01/26/2016  Oral amoxicillin 01/26/2016   Subjective: Patient denies any chest pain. No shortness of breath. Patient tolerating current diet.   Objective: Filed Vitals:   01/26/16 0224 01/26/16 0459 01/26/16 0825 01/26/16 1051  BP:  138/63    Pulse: 86 84 63   Temp:  98.1 F (36.7 C)    TempSrc:      Resp: '18 16 15   '$ Height:      Weight:  54.3 kg (119 lb 11.4 oz)    SpO2: 95% 95% 95% 91%    Intake/Output Summary (Last 24 hours) at 01/26/16 1231 Last data filed at 01/26/16 0932  Gross per 24 hour  Intake    230 ml  Output    825 ml  Net   -595 ml   Filed Weights   01/24/16 0552 01/25/16 0552 01/26/16 0459  Weight: 54 kg (119 lb 0.8 oz) 53.5 kg (117 lb 15.1 oz) 54.3 kg (119 lb 11.4 oz)    Examination:  General exam: Appears calm and comfortable  Respiratory system: Bibasilar crackles. Respiratory effort normal. Cardiovascular system: S1 & S2 heard, RRR. No JVD, murmurs, rubs, gallops or clicks. No pedal edema. Gastrointestinal system: Abdomen is nondistended,  soft and nontender. No organomegaly or masses felt. Normal bowel sounds heard. Central nervous system: Alert and oriented. No focal neurological deficits. Extremities: Left lower extremity with bandage and in immobilizer. Trace bilateral lower extremity edema. Skin: No rashes, lesions or ulcers Psychiatry: Judgement and insight appear normal. Mood & affect appropriate.     Data Reviewed: I have personally reviewed following labs and imaging studies  CBC:  Recent Labs Lab 01/20/16 1538  01/21/16 1159 01/22/16 0404 01/22/16 1657 01/23/16 0400 01/24/16 0342 01/25/16 0540  WBC 6.9  < > 57.1* 50.7*  --  53.3* 34.8* 20.3*  NEUTROABS 6.3  --   --   --   --   --  32.4*  --   HGB 11.5*  < > 9.3* 8.3* 8.5* 8.1* 8.2* 8.5*  HCT 37.7  < > 29.8* 26.5* 26.8* 25.5* 25.5* 26.0*  MCV 85.3  < > 83.9 82.8  --  83.3 82.0 87.5  PLT 389  < > 179 134*  --  89* 77* 80*  < > = values in this interval not displayed. Basic Metabolic Panel:  Recent Labs Lab 01/21/16 0209  01/22/16 1500 01/23/16 0400 01/24/16 0342 01/25/16 0540 01/26/16 0759  NA 133*  < > 137 140 141 143 140  K 3.5  < > 3.5 3.1* 2.3* 3.8 3.3*  CL 105  < > 105 105 103 107 107  CO2 17*  < > '24 27 28 24 26  '$ GLUCOSE 161*  < > 92 83 99 85 91  BUN 35*  < > 30* 33* 29* 19 10  CREATININE 1.43*  < > 0.97 1.07* 0.95 0.72 0.59  CALCIUM 8.5*  < > 7.7* 7.9* 8.3* 8.4* 8.0*  MG 1.9  --   --   --   --   --   --   PHOS 4.2  --   --   --   --   --   --   < > = values in this interval not displayed. GFR: Estimated Creatinine Clearance: 48.8 mL/min (by C-G formula based on Cr of 0.59). Liver Function Tests:  Recent Labs Lab 01/20/16 1538 01/24/16 0342  AST 115* 164*  ALT 68* 472*  ALKPHOS 255* 212*  BILITOT 1.0 0.7  PROT 6.9 4.9*  ALBUMIN 2.6* 1.7*    Recent Labs Lab 01/20/16 1538  LIPASE 26   No results for input(s): AMMONIA in the last 168 hours. Coagulation Profile:  Recent Labs Lab 01/22/16 0020  INR 2.03*   Cardiac  Enzymes:  Recent Labs Lab 01/21/16 2100 01/22/16 0020 01/22/16 0832 01/22/16 1230 01/22/16 1500  CKTOTAL  --  352*  --   --   --   CKMB  --  12.1*  --   --   --   TROPONINI 0.52* 0.79* 1.36* 1.52* 1.53*   BNP (last 3 results) No results for input(s): PROBNP in the last 8760 hours. HbA1C: No results for input(s): HGBA1C in the last 72 hours. CBG:  Recent Labs Lab 01/25/16 2055 01/25/16 2357 01/26/16 0456 01/26/16 0751 01/26/16 1158  GLUCAP 111* 106* 95 96 164*   Lipid Profile: No results for input(s): CHOL, HDL, LDLCALC, TRIG, CHOLHDL, LDLDIRECT in the last 72 hours. Thyroid Function Tests: No results for input(s): TSH, T4TOTAL, FREET4, T3FREE, THYROIDAB in the last 72 hours. Anemia Panel: No results for input(s): VITAMINB12, FOLATE, FERRITIN, TIBC, IRON, RETICCTPCT in the last 72 hours. Sepsis Labs:  Recent Labs Lab 01/20/16 1648 01/20/16 1939 01/21/16 0130 01/21/16 0512 01/21/16 0843 01/22/16 0400 01/23/16 0400  PROCALCITON  --   --  >175.00 >175.00  --  >175.00 173.23  LATICACIDVEN 7.74* 5.90*  --  3.1* 3.0*  --   --     Recent Results (from the past 240 hour(s))  Blood Culture (routine x 2)     Status: Abnormal   Collection Time: 01/20/16  4:30 PM  Result Value Ref Range Status   Specimen Description BLOOD RIGHT HAND  Final   Special Requests BOTTLES DRAWN AEROBIC ONLY 5CC  Final   Culture  Setup Time   Final    GRAM NEGATIVE RODS AEROBIC BOTTLE ONLY CRITICAL RESULT CALLED TO, READ BACK BY AND VERIFIED WITH: K COMBS,PHARMD AT 0743 01/21/16 BY L BENFIELD    Culture (A)  Final    PROTEUS MIRABILIS SUSCEPTIBILITIES PERFORMED ON PREVIOUS CULTURE WITHIN THE LAST 5 DAYS.    Report Status 01/23/2016 FINAL  Final  Blood Culture (routine x 2)     Status: Abnormal   Collection Time: 01/20/16  4:40 PM  Result Value Ref Range Status   Specimen Description BLOOD LEFT ANTECUBITAL  Final   Special Requests BOTTLES DRAWN AEROBIC AND ANAEROBIC 5CC  Final    Culture  Setup Time   Final    GRAM NEGATIVE RODS IN BOTH AEROBIC AND ANAEROBIC BOTTLES CRITICAL RESULT CALLED TO, READ BACK BY AND VERIFIED WITH: K COMBS,PHARMD AT 0743 01/21/16 BY L BENFIELD    Culture PROTEUS MIRABILIS (A)  Final   Report Status 01/23/2016 FINAL  Final   Organism ID, Bacteria PROTEUS MIRABILIS  Final  Susceptibility   Proteus mirabilis - MIC*    AMPICILLIN <=2 SENSITIVE Sensitive     CEFAZOLIN <=4 SENSITIVE Sensitive     CEFEPIME <=1 SENSITIVE Sensitive     CEFTAZIDIME <=1 SENSITIVE Sensitive     CEFTRIAXONE <=1 SENSITIVE Sensitive     CIPROFLOXACIN <=0.25 SENSITIVE Sensitive     GENTAMICIN <=1 SENSITIVE Sensitive     IMIPENEM 2 SENSITIVE Sensitive     TRIMETH/SULFA <=20 SENSITIVE Sensitive     AMPICILLIN/SULBACTAM <=2 SENSITIVE Sensitive     PIP/TAZO <=4 SENSITIVE Sensitive     * PROTEUS MIRABILIS  Blood Culture ID Panel (Reflexed)     Status: Abnormal   Collection Time: 01/20/16  4:40 PM  Result Value Ref Range Status   Enterococcus species NOT DETECTED NOT DETECTED Final   Vancomycin resistance NOT DETECTED NOT DETECTED Final   Listeria monocytogenes NOT DETECTED NOT DETECTED Final   Staphylococcus species NOT DETECTED NOT DETECTED Final   Staphylococcus aureus NOT DETECTED NOT DETECTED Final   Methicillin resistance NOT DETECTED NOT DETECTED Final   Streptococcus species NOT DETECTED NOT DETECTED Final   Streptococcus agalactiae NOT DETECTED NOT DETECTED Final   Streptococcus pneumoniae NOT DETECTED NOT DETECTED Final   Streptococcus pyogenes NOT DETECTED NOT DETECTED Final   Acinetobacter baumannii NOT DETECTED NOT DETECTED Final   Enterobacteriaceae species DETECTED (A) NOT DETECTED Final    Comment: CRITICAL RESULT CALLED TO, READ BACK BY AND VERIFIED WITH: K COMBS,PHARMD AT 0743 01/21/16 BY L BENFIELD    Enterobacter cloacae complex NOT DETECTED NOT DETECTED Final   Escherichia coli NOT DETECTED NOT DETECTED Final   Klebsiella oxytoca NOT  DETECTED NOT DETECTED Final   Klebsiella pneumoniae NOT DETECTED NOT DETECTED Final   Proteus species DETECTED (A) NOT DETECTED Final    Comment: CRITICAL RESULT CALLED TO, READ BACK BY AND VERIFIED WITH: K COMBS,PHARMD AT 0743 01/21/16 BY L BENFIELD    Serratia marcescens NOT DETECTED NOT DETECTED Final   Carbapenem resistance NOT DETECTED NOT DETECTED Final   Haemophilus influenzae NOT DETECTED NOT DETECTED Final   Neisseria meningitidis NOT DETECTED NOT DETECTED Final   Pseudomonas aeruginosa NOT DETECTED NOT DETECTED Final   Candida albicans NOT DETECTED NOT DETECTED Final   Candida glabrata NOT DETECTED NOT DETECTED Final   Candida krusei NOT DETECTED NOT DETECTED Final   Candida parapsilosis NOT DETECTED NOT DETECTED Final   Candida tropicalis NOT DETECTED NOT DETECTED Final  Urine culture     Status: Abnormal   Collection Time: 01/20/16  6:32 PM  Result Value Ref Range Status   Specimen Description URINE, CATHETERIZED  Final   Special Requests NONE  Final   Culture 7,000 COLONIES/mL INSIGNIFICANT GROWTH (A)  Final   Report Status 01/22/2016 FINAL  Final  Urine culture     Status: Abnormal   Collection Time: 01/20/16 10:06 PM  Result Value Ref Range Status   Specimen Description URINE, CATHETERIZED  Final   Special Requests RIGHT KIDNEY  Final   Culture 10,000 COLONIES/mL PROTEUS MIRABILIS (A)  Final   Report Status 01/23/2016 FINAL  Final   Organism ID, Bacteria PROTEUS MIRABILIS (A)  Final      Susceptibility   Proteus mirabilis - MIC*    AMPICILLIN <=2 SENSITIVE Sensitive     CEFAZOLIN <=4 SENSITIVE Sensitive     CEFTRIAXONE <=1 SENSITIVE Sensitive     CIPROFLOXACIN <=0.25 SENSITIVE Sensitive     GENTAMICIN <=1 SENSITIVE Sensitive     IMIPENEM 1 SENSITIVE Sensitive  NITROFURANTOIN 128 RESISTANT Resistant     TRIMETH/SULFA <=20 SENSITIVE Sensitive     AMPICILLIN/SULBACTAM <=2 SENSITIVE Sensitive     PIP/TAZO <=4 SENSITIVE Sensitive     * 10,000 COLONIES/mL PROTEUS  MIRABILIS  MRSA PCR Screening     Status: None   Collection Time: 01/21/16  1:00 AM  Result Value Ref Range Status   MRSA by PCR NEGATIVE NEGATIVE Final    Comment:        The GeneXpert MRSA Assay (FDA approved for NASAL specimens only), is one component of a comprehensive MRSA colonization surveillance program. It is not intended to diagnose MRSA infection nor to guide or monitor treatment for MRSA infections.   Culture, blood (Routine X 2) w Reflex to ID Panel     Status: None (Preliminary result)   Collection Time: 01/23/16 11:10 AM  Result Value Ref Range Status   Specimen Description BLOOD LEFT HAND  Final   Special Requests IN PEDIATRIC BOTTLE 1 CC  Final   Culture NO GROWTH 3 DAYS  Final   Report Status PENDING  Incomplete  Culture, blood (Routine X 2) w Reflex to ID Panel     Status: None (Preliminary result)   Collection Time: 01/23/16 11:11 AM  Result Value Ref Range Status   Specimen Description BLOOD RIGHT HAND  Final   Special Requests IN PEDIATRIC BOTTLE 3 CC  Final   Culture NO GROWTH 3 DAYS  Final   Report Status PENDING  Incomplete         Radiology Studies: No results found.      Scheduled Meds: . ampicillin (OMNIPEN) IV  1 g Intravenous Q6H  . antiseptic oral rinse  7 mL Mouth Rinse q12n4p  . carvedilol  3.125 mg Oral BID WC  . chlorhexidine  15 mL Mouth Rinse BID  . enoxaparin (LOVENOX) injection  40 mg Subcutaneous Q24H  . feeding supplement (ENSURE ENLIVE)  237 mL Oral TID BM  . insulin aspart  0-9 Units Subcutaneous Q4H  . multivitamin with minerals  1 tablet Oral Daily  . potassium chloride  40 mEq Oral Once   Continuous Infusions:     LOS: 6 days    Time spent: 35 minutes    THOMPSON,DANIEL, MD [facility] Pager [phone]  If 7PM-7AM, please contact night-coverage [email or web address] Password [facility] 01/26/2016, 12:31 PM

## 2016-01-27 DIAGNOSIS — I5023 Acute on chronic systolic (congestive) heart failure: Secondary | ICD-10-CM

## 2016-01-27 LAB — BASIC METABOLIC PANEL
Anion gap: 6 (ref 5–15)
BUN: 10 mg/dL (ref 6–20)
CHLORIDE: 106 mmol/L (ref 101–111)
CO2: 25 mmol/L (ref 22–32)
CREATININE: 0.64 mg/dL (ref 0.44–1.00)
Calcium: 8.4 mg/dL — ABNORMAL LOW (ref 8.9–10.3)
GFR calc Af Amer: >60 mL/min (ref >60)
GFR calc non Af Amer: >60 mL/min (ref >60)
Glucose, Bld: 111 mg/dL — ABNORMAL HIGH (ref 65–99)
POTASSIUM: 3.7 mmol/L (ref 3.5–5.1)
Sodium: 137 mmol/L (ref 135–145)

## 2016-01-27 LAB — GLUCOSE, CAPILLARY
GLUCOSE-CAPILLARY: 115 mg/dL — AB (ref 65–99)
GLUCOSE-CAPILLARY: 130 mg/dL — AB (ref 65–99)
Glucose-Capillary: 109 mg/dL — ABNORMAL HIGH (ref 65–99)
Glucose-Capillary: 114 mg/dL — ABNORMAL HIGH (ref 65–99)
Glucose-Capillary: 143 mg/dL — ABNORMAL HIGH (ref 65–99)
Glucose-Capillary: 87 mg/dL (ref 65–99)

## 2016-01-27 LAB — CBC
HCT: 29 % — ABNORMAL LOW (ref 36.0–46.0)
Hemoglobin: 8.9 g/dL — ABNORMAL LOW (ref 12.0–15.0)
MCH: 26.6 pg (ref 26.0–34.0)
MCHC: 30.7 g/dL (ref 30.0–36.0)
MCV: 86.6 fL (ref 78.0–100.0)
PLATELETS: 147 10*3/uL — AB (ref 150–400)
RBC: 3.35 MIL/uL — ABNORMAL LOW (ref 3.87–5.11)
RDW: 16.9 % — ABNORMAL HIGH (ref 11.5–15.5)
WBC: 14 10*3/uL — ABNORMAL HIGH (ref 4.0–10.5)

## 2016-01-27 MED ORDER — VALACYCLOVIR HCL 500 MG PO TABS
1000.0000 mg | ORAL_TABLET | Freq: Three times a day (TID) | ORAL | Status: DC
  Administered 2016-01-27 – 2016-02-01 (×16): 1000 mg via ORAL
  Filled 2016-01-27 (×16): qty 2

## 2016-01-27 NOTE — Progress Notes (Signed)
    Subjective:  No CP or dyspnea.   Objective:  Vital Signs in the last 24 hours: Temp:  [97.7 F (36.5 C)-100.2 F (37.9 C)] 100.2 F (37.9 C) (06/30 1405) Pulse Rate:  [94-98] 95 (06/30 1405) Resp:  [16-21] 21 (06/30 1405) BP: (116-141)/(48-57) 116/48 mmHg (06/30 1405) SpO2:  [92 %-93 %] 92 % (06/30 1405)  Intake/Output from previous day: 06/29 0701 - 06/30 0700 In: 630 [P.O.:620] Out: 1475 [Urine:1475]  Physical Exam: Pt is alert and answers questions appropriately. In NAD.  HEENT: normal Neck: JVP - normal Lungs: CTA bilaterally CV: RRR with 2/6 systolic murmur at the LSB Abd: soft, NT, Positive BS, no hepatomegaly Ext: left leg dressing in place Skin: warm/dry no rash   Lab Results:  Recent Labs  01/25/16 0540 01/27/16 0531  WBC 20.3* 14.0*  HGB 8.5* 8.9*  PLT 80* 147*    Recent Labs  01/26/16 0759 01/27/16 0531  NA 140 137  K 3.3* 3.7  CL 107 106  CO2 26 25  GLUCOSE 91 111*  BUN 10 10  CREATININE 0.59 0.64   No results for input(s): TROPONINI in the last 72 hours.  Invalid input(s): CK, MB  Cardiac Studies: FU 2D Echo: Study Conclusions  - Study data: M-mode, limited 2D, limited spectral Doppler, and  color Doppler. - Left ventricle: The cavity size was normal. Wall thickness was  normal. Systolic function was moderately reduced. The estimated  ejection fraction was in the range of 35% to 40%. There is  inferior, distal apical, apical and mid to distal septal  hypokinesis. - Aortic valve: Calcified leaflets with restriction. There was  moderate regurgitation.  Impressions:  - Limited study. Compared to a prior echo on 01/22/2016, the LVEF  has improved to 35-40%.  Assessment/Plan:  1. Acute systolic heart failure, without congestive symptoms 2. Demand ischemia 3. Septic shock, improved  The patient's follow-up echocardiogram is reviewed. LVEF has improved from 25-30% now up to 35-40%. With early improvement in LV  function and a periapical wall motion abnormality, I think stress-induced cardiomyopathy is most likely etiology considering her clinical presentation with septic shock. Recommend continuing her on carvedilol at current dose. I do not think the risk/benefit of adding an ACE inhibitor is favorable at this time considering her multiple medical problems and risk of acute kidney injury. Overall her cardiac situation is stable and I will arrange outpatient follow-up. She should have a repeat echocardiogram in about 3 months. Please call if any other cardiac issues arise.  Sherren Mocha, M.D. 01/27/2016, 5:53 PM

## 2016-01-27 NOTE — Progress Notes (Signed)
Nutrition Follow-up  DOCUMENTATION CODES:   Not applicable  INTERVENTION:   -Continue Ensure Enlive po TID, each supplement provides 350 kcal and 20 grams of protein  NUTRITION DIAGNOSIS:   Inadequate oral intake related to poor appetite as evidenced by meal completion < 25%, percent weight loss, per patient/family report.  Progressing  GOAL:   Patient will meet greater than or equal to 90% of their needs  Progressing  MONITOR:   PO intake, Supplement acceptance, Labs, Weight trends, Skin, I & O's  REASON FOR ASSESSMENT:   Malnutrition Screening Tool    ASSESSMENT:   [name] is a 75 y.o. female who presented to the emergency room here at [facility] earlier today with a two-week history of crampy abdominal pain, frequency, urgency, dysuria.  Pt sleeping soundly at time of visit.   Spoke with daughter-in-law at bedside. She reports appetite has improved over the past 3-4 days. She feels encouraged that pt consumed about 75% of her eggs this morning. Meal completion 15%. Pt is also consuming Ensure supplements. Per daughter-in law report, pt consumed approximately 1.5 Ensure supplements yesterday. Noted pt had consumed about 50% of this morning's dose of Ensure.   Per MD notes, plan to d/c to SNF once medically stable.   Labs reviewed: CBGS: 113-115.   Diet Order:  DIET SOFT Room service appropriate?: Yes; Fluid consistency:: Thin  Skin:  Wound (see comment) (DTPI lt heel, st II rt upper lumbar, closed lt knee incision)  Last BM:  01/27/15  Height:   Ht Readings from Last 1 Encounters:  01/23/16 '5\' 2"'$  (1.575 m)    Weight:   Wt Readings from Last 1 Encounters:  01/26/16 119 lb 11.4 oz (54.3 kg)    Ideal Body Weight:  50 kg  BMI:  Body mass index is 21.89 kg/(m^2).  Estimated Nutritional Needs:   Kcal:  1600-1800  Protein:  80-95 grams  Fluid:  >1.6 L  EDUCATION NEEDS:   Education needs addressed  Imo Cumbie A. Jimmye [facility], RD, LDN,  CDE Pager: [phone] After hours Pager: [phone]

## 2016-01-27 NOTE — Progress Notes (Signed)
PROGRESS NOTE    [name]  HYQ:[id number] DOB: [birth year] DOA: 01/20/2016 PCP: Kirk Ruths, MD   Brief Narrative:  [name] is a 75 year old female with a past medical history of a total left knee arthroplasty seizure performed on 09/28/2015 by Dr. Sharol Given of orthopedic surgery, was complicated by infection undergoing removal of hardware and placement of antibiotic beads on 10/12/2015, procedure performed by Dr. Sharol Given. Unfortunately she had dehiscence of left knee arthroplasty undergoing irrigation and debridement on 10/18/2015. She was admitted to the pulmonary critical care service on 01/20/2016 presenting with abdominal pain, dysuria, nausea, vomiting, found have a temperature of 103, hypotensive, and required pressor support. She was further worked up with CT scan of abdomen and pelvis that revealed obstructing UVJ stone with associated hydroureteronephrosis. Urology was consulted, she underwent nephrostomy tube placement. Urine cultures and blood cultures both growing Proteus mirabilis, with repeat blood cultures performed on 01/23/2016 showing no growth to date. She is on ceftriaxone 2 g IV every 24 hours. During this hospitalization she was seen by her orthopedic surgeon Dr Sharol Given who recommended at least 4 weeks of antibiotic coverage. On 01/24/2016 her care was transferred from [facility] to medicine.    Assessment & Plan:   Principal Problem:   Septic shock ([place]) Active Problems:   Pressure ulcer   Elevated troponin   Abnormal echocardiogram   Hypoxia   Sepsis (HCC)   Obstructive uropathy   Hypokalemia  1. Septic shock -[name] is a 75 year old female she presented in shock likely secondary to urosepsis, requiring IV pressor support and ICU admission. -Source of infection likely pyelonephritis/UTI, as CT scan revealed evidence of obstructive uropathy. Radiology reported obstructing 2 mm right UVJ stone with associated hydronephrosis. -Both urine and blood cultures growing  Proteus -She is now hemodynamically stable off of pressor support and has been transferred to the medicine service. -Susceptibility testing showing organism is sensitive to beta lactams. WBC trending down and currently at 20.3 from 57.1. -Discontinued ceftriaxone and started with ampicillin -Repeat blood cultures drawn on 01/23/2016 show no growth after overnight incubation. Patient has been transitioned to oral amoxicillin.   2. Obstructive uropathy. -She was admitted for urosepsis, with CT scan showing obstructing stone and right UVJ that was associated with hydronephrosis. -She underwent urostomy tube placement, which appears to be draining well. -Foley catheter remains in place -Urology was consulted during this hospitalization.  -Treating UTI with ampicillin IV. Changed IV ampicillin to oral amoxicillin.  Discussed obstructive uropathy and nephrostomy tubes with urology were recommending internalized in of [place] stents. IR has been contacted and this will be done on Monday, 01/30/2016. Will likely need to follow-up with urology and interventional radiology as outpatient.  3. History of left total knee arthroplasty -Complicated by infection -During this hospitalization she was seen by plastics and orthopedic surgery -Source of sepsis likely to be urinary -Dr. Sharol Given recommending one month of antibiotic therapy  4. Hypokalemia. -A.m. lab work showing potassium of 2.3 on 01/25/2016, she had been on Lasix therapy. -Replaced with by mouth and IV potassium. Repeat labs with potassium of 3.7. Monitor closely with diuresis.  5. History of hypertension. -Continue Coreg 3.125 mg by mouth twice a day  5. Acute systolic congestive heart failure -Transthoracic echocardiogram showing EF of 25-30% -She is clinically euvolemic, had been on Lasix 40 mg IV twice a day -Replacing potassium -Cardiology following recommending repeating 2-D echo on 01/26/2016 with further recommendations to follow  pending 2-D echo outcome. 2-D echo done on 01/26/2016 with  a EF of 35-40% with inferior, distal, apical and mid to distal septal hypokinesis. EF with some improvement from 01/22/2016 when EF was 25-30%. Patient has been transitioned to oral Lasix 20 mg daily. Continue Coreg. Cardiology considering the addition of a low-dose ACE inhibitor have a blood pressure soft and will defer decision to cardiology. Per cardiology.  6. Probable demand ischemia/Elevated troponin -Evaluated by cardiology, lab work revealed peak troponin of 1.53 -Cardiology recommending a repeat echo, if LVEF remains low may consider ischemic workup. IV heparin has been discontinued per cardiology.    DVT prophylaxis: SCDs Code Status: Full Family Communication: Updated patient and daughter-in-law at bedside. Disposition Plan: Likely skilled nursing facility hopefully early next week after and JJ stent has been internalized.    Consultants:   Urology: Dr. Diona Fanti 01/20/2016  Interventional radiology Dr. Kathlene Cote 01/20/2016  Cardiology: Dr. Burt Knack 01/23/2016  Orthopedics: Dr. Sharol Given 01/24/2016  Plastic surgery  Procedures:   CT abdomen and pelvis 01/20/2016  Chest x-ray 01/20/2016, 01/21/2016, 01/22/2016  2-D echo 01/22/2016, 01/26/2016  Right percutaneous nephrostomy tube placement Dr. Kathlene Cote 01/20/2016  Antimicrobials:   IV Fortaz 01/20/2016>>>> 01/23/2016  IV Rocephin 01/23/2016>>>> 01/24/2016  IV vancomycin 01/20/2016>>>> 01/22/2016  IV ampicillin 01/24/2016>>>>> 01/26/2016  Oral amoxicillin 01/26/2016   Subjective: Patient sleeping however easily arousable. Patient denies any chest pain. No shortness of breath. Patient tolerating current diet.   Objective: Filed Vitals:   01/26/16 1349 01/26/16 2001 01/27/16 0518 01/27/16 1405  BP: 128/57 139/55 141/57 116/48  Pulse: 88 94 98 95  Temp: 98.4 F (36.9 C) 98.9 F (37.2 C) 97.7 F (36.5 C) 100.2 F (37.9 C)  TempSrc: Oral Oral  Axillary   Resp: '16 16 16 21  '$ Height:      Weight:      SpO2: 95% 92% 93% 92%    Intake/Output Summary (Last 24 hours) at 01/27/16 1636 Last data filed at 01/27/16 1414  Gross per 24 hour  Intake    640 ml  Output   1400 ml  Net   -760 ml   Filed Weights   01/24/16 0552 01/25/16 0552 01/26/16 0459  Weight: 54 kg (119 lb 0.8 oz) 53.5 kg (117 lb 15.1 oz) 54.3 kg (119 lb 11.4 oz)    Examination:  General exam: Appears calm and comfortable  Respiratory system: Bibasilar crackles. Respiratory effort normal. Cardiovascular system: S1 & S2 heard, RRR. No JVD, murmurs, rubs, gallops or clicks. No pedal edema. Gastrointestinal system: Abdomen is nondistended, soft and nontender. No organomegaly or masses felt. Normal bowel sounds heard. Central nervous system: Alert and oriented. No focal neurological deficits. Extremities: Left lower extremity with bandage and in immobilizer. Trace bilateral lower extremity edema. Skin: No rashes, lesions or ulcers Psychiatry: Judgement and insight appear normal. Mood & affect appropriate.     Data Reviewed: I have personally reviewed following labs and imaging studies  CBC:  Recent Labs Lab 01/22/16 0404 01/22/16 1657 01/23/16 0400 01/24/16 0342 01/25/16 0540 01/27/16 0531  WBC 50.7*  --  53.3* 34.8* 20.3* 14.0*  NEUTROABS  --   --   --  32.4*  --   --   HGB 8.3* 8.5* 8.1* 8.2* 8.5* 8.9*  HCT 26.5* 26.8* 25.5* 25.5* 26.0* 29.0*  MCV 82.8  --  83.3 82.0 87.5 86.6  PLT 134*  --  89* 77* 80* 646*   Basic Metabolic Panel:  Recent Labs Lab 01/21/16 0209  01/23/16 0400 01/24/16 0342 01/25/16 0540 01/26/16 0759 01/27/16 0531  NA 133*  < >  140 141 143 140 137  K 3.5  < > 3.1* 2.3* 3.8 3.3* 3.7  CL 105  < > 105 103 107 107 106  CO2 17*  < > '27 28 24 26 25  '$ GLUCOSE 161*  < > 83 99 85 91 111*  BUN 35*  < > 33* 29* '19 10 10  '$ CREATININE 1.43*  < > 1.07* 0.95 0.72 0.59 0.64  CALCIUM 8.5*  < > 7.9* 8.3* 8.4* 8.0* 8.4*  MG 1.9  --   --    --   --   --   --   PHOS 4.2  --   --   --   --   --   --   < > = values in this interval not displayed. GFR: Estimated Creatinine Clearance: 48.8 mL/min (by C-G formula based on Cr of 0.64). Liver Function Tests:  Recent Labs Lab 01/24/16 0342  AST 164*  ALT 472*  ALKPHOS 212*  BILITOT 0.7  PROT 4.9*  ALBUMIN 1.7*   No results for input(s): LIPASE, AMYLASE in the last 168 hours. No results for input(s): AMMONIA in the last 168 hours. Coagulation Profile:  Recent Labs Lab 01/22/16 0020  INR 2.03*   Cardiac Enzymes:  Recent Labs Lab 01/21/16 2100 01/22/16 0020 01/22/16 0832 01/22/16 1230 01/22/16 1500  CKTOTAL  --  352*  --   --   --   CKMB  --  12.1*  --   --   --   TROPONINI 0.52* 0.79* 1.36* 1.52* 1.53*   BNP (last 3 results) No results for input(s): PROBNP in the last 8760 hours. HbA1C: No results for input(s): HGBA1C in the last 72 hours. CBG:  Recent Labs Lab 01/26/16 2012 01/26/16 2343 01/27/16 0339 01/27/16 0842 01/27/16 1156  GLUCAP 148* 113* 115* 109* 130*   Lipid Profile: No results for input(s): CHOL, HDL, LDLCALC, TRIG, CHOLHDL, LDLDIRECT in the last 72 hours. Thyroid Function Tests: No results for input(s): TSH, T4TOTAL, FREET4, T3FREE, THYROIDAB in the last 72 hours. Anemia Panel: No results for input(s): VITAMINB12, FOLATE, FERRITIN, TIBC, IRON, RETICCTPCT in the last 72 hours. Sepsis Labs:  Recent Labs Lab 01/20/16 1648 01/20/16 1939 01/21/16 0130 01/21/16 0512 01/21/16 0843 01/22/16 0400 01/23/16 0400  PROCALCITON  --   --  >175.00 >175.00  --  >175.00 173.23  LATICACIDVEN 7.74* 5.90*  --  3.1* 3.0*  --   --     Recent Results (from the past 240 hour(s))  Blood Culture (routine x 2)     Status: Abnormal   Collection Time: 01/20/16  4:30 PM  Result Value Ref Range Status   Specimen Description BLOOD RIGHT HAND  Final   Special Requests BOTTLES DRAWN AEROBIC ONLY 5CC  Final   Culture  Setup Time   Final    GRAM NEGATIVE  RODS AEROBIC BOTTLE ONLY CRITICAL RESULT CALLED TO, READ BACK BY AND VERIFIED WITH: K COMBS,PHARMD AT 0743 01/21/16 BY L BENFIELD    Culture (A)  Final    PROTEUS MIRABILIS SUSCEPTIBILITIES PERFORMED ON PREVIOUS CULTURE WITHIN THE LAST 5 DAYS.    Report Status 01/23/2016 FINAL  Final  Blood Culture (routine x 2)     Status: Abnormal   Collection Time: 01/20/16  4:40 PM  Result Value Ref Range Status   Specimen Description BLOOD LEFT ANTECUBITAL  Final   Special Requests BOTTLES DRAWN AEROBIC AND ANAEROBIC 5CC  Final   Culture  Setup Time   Final  GRAM NEGATIVE RODS IN BOTH AEROBIC AND ANAEROBIC BOTTLES CRITICAL RESULT CALLED TO, READ BACK BY AND VERIFIED WITH: K COMBS,PHARMD AT 0743 01/21/16 BY L BENFIELD    Culture PROTEUS MIRABILIS (A)  Final   Report Status 01/23/2016 FINAL  Final   Organism ID, Bacteria PROTEUS MIRABILIS  Final      Susceptibility   Proteus mirabilis - MIC*    AMPICILLIN <=2 SENSITIVE Sensitive     CEFAZOLIN <=4 SENSITIVE Sensitive     CEFEPIME <=1 SENSITIVE Sensitive     CEFTAZIDIME <=1 SENSITIVE Sensitive     CEFTRIAXONE <=1 SENSITIVE Sensitive     CIPROFLOXACIN <=0.25 SENSITIVE Sensitive     GENTAMICIN <=1 SENSITIVE Sensitive     IMIPENEM 2 SENSITIVE Sensitive     TRIMETH/SULFA <=20 SENSITIVE Sensitive     AMPICILLIN/SULBACTAM <=2 SENSITIVE Sensitive     PIP/TAZO <=4 SENSITIVE Sensitive     * PROTEUS MIRABILIS  Blood Culture ID Panel (Reflexed)     Status: Abnormal   Collection Time: 01/20/16  4:40 PM  Result Value Ref Range Status   Enterococcus species NOT DETECTED NOT DETECTED Final   Vancomycin resistance NOT DETECTED NOT DETECTED Final   Listeria monocytogenes NOT DETECTED NOT DETECTED Final   Staphylococcus species NOT DETECTED NOT DETECTED Final   Staphylococcus aureus NOT DETECTED NOT DETECTED Final   Methicillin resistance NOT DETECTED NOT DETECTED Final   Streptococcus species NOT DETECTED NOT DETECTED Final   Streptococcus agalactiae  NOT DETECTED NOT DETECTED Final   Streptococcus pneumoniae NOT DETECTED NOT DETECTED Final   Streptococcus pyogenes NOT DETECTED NOT DETECTED Final   Acinetobacter baumannii NOT DETECTED NOT DETECTED Final   Enterobacteriaceae species DETECTED (A) NOT DETECTED Final    Comment: CRITICAL RESULT CALLED TO, READ BACK BY AND VERIFIED WITH: K COMBS,PHARMD AT 0743 01/21/16 BY L BENFIELD    Enterobacter cloacae complex NOT DETECTED NOT DETECTED Final   Escherichia coli NOT DETECTED NOT DETECTED Final   Klebsiella oxytoca NOT DETECTED NOT DETECTED Final   Klebsiella pneumoniae NOT DETECTED NOT DETECTED Final   Proteus species DETECTED (A) NOT DETECTED Final    Comment: CRITICAL RESULT CALLED TO, READ BACK BY AND VERIFIED WITH: K COMBS,PHARMD AT 0743 01/21/16 BY L BENFIELD    Serratia marcescens NOT DETECTED NOT DETECTED Final   Carbapenem resistance NOT DETECTED NOT DETECTED Final   Haemophilus influenzae NOT DETECTED NOT DETECTED Final   Neisseria meningitidis NOT DETECTED NOT DETECTED Final   Pseudomonas aeruginosa NOT DETECTED NOT DETECTED Final   Candida albicans NOT DETECTED NOT DETECTED Final   Candida glabrata NOT DETECTED NOT DETECTED Final   Candida krusei NOT DETECTED NOT DETECTED Final   Candida parapsilosis NOT DETECTED NOT DETECTED Final   Candida tropicalis NOT DETECTED NOT DETECTED Final  Urine culture     Status: Abnormal   Collection Time: 01/20/16  6:32 PM  Result Value Ref Range Status   Specimen Description URINE, CATHETERIZED  Final   Special Requests NONE  Final   Culture 7,000 COLONIES/mL INSIGNIFICANT GROWTH (A)  Final   Report Status 01/22/2016 FINAL  Final  Urine culture     Status: Abnormal   Collection Time: 01/20/16 10:06 PM  Result Value Ref Range Status   Specimen Description URINE, CATHETERIZED  Final   Special Requests RIGHT KIDNEY  Final   Culture 10,000 COLONIES/mL PROTEUS MIRABILIS (A)  Final   Report Status 01/23/2016 FINAL  Final   Organism ID,  Bacteria PROTEUS MIRABILIS (A)  Final  Susceptibility   Proteus mirabilis - MIC*    AMPICILLIN <=2 SENSITIVE Sensitive     CEFAZOLIN <=4 SENSITIVE Sensitive     CEFTRIAXONE <=1 SENSITIVE Sensitive     CIPROFLOXACIN <=0.25 SENSITIVE Sensitive     GENTAMICIN <=1 SENSITIVE Sensitive     IMIPENEM 1 SENSITIVE Sensitive     NITROFURANTOIN 128 RESISTANT Resistant     TRIMETH/SULFA <=20 SENSITIVE Sensitive     AMPICILLIN/SULBACTAM <=2 SENSITIVE Sensitive     PIP/TAZO <=4 SENSITIVE Sensitive     * 10,000 COLONIES/mL PROTEUS MIRABILIS  MRSA PCR Screening     Status: None   Collection Time: 01/21/16  1:00 AM  Result Value Ref Range Status   MRSA by PCR NEGATIVE NEGATIVE Final    Comment:        The GeneXpert MRSA Assay (FDA approved for NASAL specimens only), is one component of a comprehensive MRSA colonization surveillance program. It is not intended to diagnose MRSA infection nor to guide or monitor treatment for MRSA infections.   Culture, blood (Routine X 2) w Reflex to ID Panel     Status: None (Preliminary result)   Collection Time: 01/23/16 11:10 AM  Result Value Ref Range Status   Specimen Description BLOOD LEFT HAND  Final   Special Requests IN PEDIATRIC BOTTLE 1 CC  Final   Culture NO GROWTH 4 DAYS  Final   Report Status PENDING  Incomplete  Culture, blood (Routine X 2) w Reflex to ID Panel     Status: None (Preliminary result)   Collection Time: 01/23/16 11:11 AM  Result Value Ref Range Status   Specimen Description BLOOD RIGHT HAND  Final   Special Requests IN PEDIATRIC BOTTLE 3 CC  Final   Culture NO GROWTH 4 DAYS  Final   Report Status PENDING  Incomplete         Radiology Studies: No results found.      Scheduled Meds: . amoxicillin  500 mg Oral Q8H  . antiseptic oral rinse  7 mL Mouth Rinse q12n4p  . carvedilol  3.125 mg Oral BID WC  . chlorhexidine  15 mL Mouth Rinse BID  . enoxaparin (LOVENOX) injection  40 mg Subcutaneous Q24H  . feeding  supplement (ENSURE ENLIVE)  237 mL Oral TID BM  . furosemide  20 mg Oral Daily  . insulin aspart  0-9 Units Subcutaneous Q4H  . multivitamin with minerals  1 tablet Oral Daily  . valACYclovir  1,000 mg Oral TID   Continuous Infusions:     LOS: 7 days    Time spent: 104 minutes    Mara Favero, MD [facility] Pager [phone]  If 7PM-7AM, please contact night-coverage [email or web address] Password [facility] 01/27/2016, 4:36 PM

## 2016-01-27 NOTE — Progress Notes (Signed)
Patient ID: [name], female   DOB: [birth year], 75 y.o.   MRN: [id number]   Request for JJ stent placement/conversion  Rt PCN placed 6/24 in IR Hydronephrosis secondary renal stone  Request received this afternoon Pt is not npo and Lovenox not held   Will plan for Mon 7/3 in IR  IR PA will see pt over weekend to prepare for Monday  procedure

## 2016-01-27 NOTE — Care Management Important Message (Signed)
Important Message  Patient Details  Name: [name] MRN: [id number] Date of Birth: 1941-02-18   Medicare Important Message Given:  Yes    Nathen May 01/27/2016, 12:14 PM

## 2016-01-28 ENCOUNTER — Encounter (HOSPITAL_COMMUNITY): Payer: Self-pay | Admitting: Physician Assistant

## 2016-01-28 DIAGNOSIS — B009 Herpesviral infection, unspecified: Secondary | ICD-10-CM | POA: Diagnosis present

## 2016-01-28 DIAGNOSIS — N1330 Unspecified hydronephrosis: Secondary | ICD-10-CM | POA: Insufficient documentation

## 2016-01-28 LAB — CULTURE, BLOOD (ROUTINE X 2)
Culture: NO GROWTH
Culture: NO GROWTH

## 2016-01-28 LAB — BASIC METABOLIC PANEL
ANION GAP: 7 (ref 5–15)
BUN: 11 mg/dL (ref 6–20)
CHLORIDE: 106 mmol/L (ref 101–111)
CO2: 25 mmol/L (ref 22–32)
Calcium: 8.4 mg/dL — ABNORMAL LOW (ref 8.9–10.3)
Creatinine, Ser: 0.63 mg/dL (ref 0.44–1.00)
GFR calc non Af Amer: >60 mL/min (ref >60)
Glucose, Bld: 97 mg/dL (ref 65–99)
Potassium: 3.7 mmol/L (ref 3.5–5.1)
SODIUM: 138 mmol/L (ref 135–145)

## 2016-01-28 LAB — GLUCOSE, CAPILLARY
Glucose-Capillary: 94 mg/dL (ref 65–99)
Glucose-Capillary: 104 mg/dL — ABNORMAL HIGH (ref 65–99)
Glucose-Capillary: 132 mg/dL — ABNORMAL HIGH (ref 65–99)
Glucose-Capillary: 129 mg/dL — ABNORMAL HIGH (ref 65–99)
Glucose-Capillary: 109 mg/dL — ABNORMAL HIGH (ref 65–99)

## 2016-01-28 LAB — CBC
HEMATOCRIT: 27.5 % — AB (ref 36.0–46.0)
HEMOGLOBIN: 8.2 g/dL — AB (ref 12.0–15.0)
MCH: 25.8 pg — ABNORMAL LOW (ref 26.0–34.0)
MCHC: 29.8 g/dL — ABNORMAL LOW (ref 30.0–36.0)
MCV: 86.5 fL (ref 78.0–100.0)
Platelets: 183 10*3/uL (ref 150–400)
RBC: 3.18 MIL/uL — AB (ref 3.87–5.11)
RDW: 17 % — ABNORMAL HIGH (ref 11.5–15.5)
WBC: 13.5 10*3/uL — AB (ref 4.0–10.5)

## 2016-01-28 NOTE — H&P (Signed)
Chief Complaint: Hydronephrosis secondary to renal calculi  Referring Physician(s): Franchot Gallo  Supervising Physician: Arne Cleveland  Patient Status: Inpatient  History of Present Illness: [name] is a 75 y.o. female who was admitted for septic shock secondary to obstructing right ureteral calculi resulting in right hydronephrosis.  She is known to our service.  She underwent placement of a percutaneous nephrostomy tube by Dr. Kathlene Cote on 01/21/2016.  Dr Diona Fanti has requested we place an internal DJ stent now.  Her Urine cultures and blood cultures both grew Proteus mirabilis  Repeat blood cultures performed on 01/23/2016 show no growth to date.   She is on ceftriaxone 2 g IV every 24 hours.  WBC is trending down. Today is 13.5 (was 20.3 3 days ago)  Last fever was yesterday at 14:05 = 100.2  Her son and daughter in law were at her bedside during my visit.   Past Medical History  Diagnosis Date  . CAD (coronary artery disease)   . HTN (hypertension)   . Hyperlipidemia   . Cerebrovascular disease   . Arthritis   . PVD (peripheral vascular disease) (HCC)     dvt in leg  . MRSA (methicillin resistant staph aureus) culture positive   . Wears glasses   . Heart murmur   . History of bronchitis   . Memory impairment   . Complication of anesthesia     Three days after procedure pt statrted to suffer with memoy loss that took about 4-5 days to come back according to [name]."    Past Surgical History  Procedure Laterality Date  . Knee surgery      denies  . Abdominal hysterectomy    . Total hip arthroplasty Right 09/10/2012    Dr Sharol Given  . Total hip arthroplasty Right 09/10/2012    Procedure: TOTAL HIP ARTHROPLASTY;  Surgeon: Newt Minion, MD;  Location: [place];  Service: Orthopedics;  Laterality: Right;  Right Total Hip Arthroplasty  . Coronary angioplasty  07    3 stents placed  . Total knee arthroplasty Left 09/28/2015    Procedure: TOTAL  KNEE ARTHROPLASTY;  Surgeon: Newt Minion, MD;  Location: [place];  Service: Orthopedics;  Laterality: Left;  . I&d knee with poly exchange Left 10/12/2015    Procedure: Irrigation and Debridement , Poly Exchange, Antibiotic Beads Left Knee;  Surgeon: Newt Minion, MD;  Location: [place];  Service: Orthopedics;  Laterality: Left;  . I&d extremity Left 10/18/2015    Procedure: Left Knee Washout, Reclosure;  Surgeon: Newt Minion, MD;  Location: [place];  Service: Orthopedics;  Laterality: Left;  [name] Eye surgery Bilateral     cataracts  . Colonoscopy    . Esophagogastroduodenoscopy    . Skin split graft Left 11/16/2015    Procedure: LEFT GASTROC MUSCLE FLAP WITH SKIN GRAFT SPLIT THICKNESS AND VAC PLACEMENT;  Surgeon: Loel Lofty [name], DO;  Location: [place];  Service: Plastics;  Laterality: Left;  . Application of wound vac Left 11/16/2015    Procedure: APPLICATION OF WOUND VAC;  Surgeon: Loel Lofty [name], DO;  Location: [place];  Service: Plastics;  Laterality: Left;  . Free flap to extremity Left 11/16/2015    Procedure: FREE FLAP TO EXTREMITY;  Surgeon: Loel Lofty [name], DO;  Location: [place];  Service: Plastics;  Laterality: Left;  . I&d extremity Left 12/08/2015    Procedure: IRRIGATION AND DEBRIDEMENT knee;  Surgeon: Loel Lofty [name], DO;  Location: [place];  Service: Clinical cytogeneticist;  Laterality: Left;  . Application of a-cell of extremity Left 12/08/2015    Procedure: APPLICATION OF A-CELL OF knee;  Surgeon: Loel Lofty [name], DO;  Location: [place];  Service: Plastics;  Laterality: Left;  . Application of wound vac Left 12/08/2015    Procedure: APPLICATION OF WOUND VAC  AND CAST to knee;  Surgeon: Loel Lofty [name], DO;  Location: [place];  Service: Plastics;  Laterality: Left;  . Incision and drainage of wound Left 01/04/2016    Procedure: DEBRIDEMENT OF LEFT KNEE WOUND WITH ACELL AND WOUND VAC PLACEMENT;  Surgeon: Loel Lofty [name], DO;  Location: [place];  Service: Plastics;  Laterality: Left;     Allergies: Review of patient's allergies indicates no known allergies.  Medications: Prior to Admission medications   Medication Sig Start Date End Date Taking? Authorizing Provider  amLODipine (NORVASC) 10 MG tablet TAKE 1 TABLET (10 MG TOTAL) BY MOUTH DAILY. Patient taking differently: Take 10 mg by mouth daily.  08/27/14  Yes Lelon Perla, MD  aspirin EC 81 MG tablet Take 81 mg by mouth daily.   Yes Historical Provider, MD  atorvastatin (LIPITOR) 40 MG tablet TAKE ONE TABLET BY MOUTH DAILY. Patient taking differently: Take 40 mg by mouth daily.  09/15/15  Yes Lelon Perla, MD  budesonide-formoterol (SYMBICORT) 160-4.5 MCG/ACT inhaler Inhale 2 puffs into the lungs 2 (two) times daily.   Yes Historical Provider, MD  colchicine 0.6 MG tablet Take 0.6 mg by mouth daily as needed (gout flare).  08/30/15  Yes Historical Provider, MD  diclofenac (VOLTAREN) 75 MG EC tablet Take 75 mg by mouth 2 (two) times daily.  12/18/11  Yes Historical Provider, MD  doxycycline (VIBRA-TABS) 100 MG tablet Take 1 tablet (100 mg total) by mouth 2 (two) times daily. Patient taking differently: Take 100 mg by mouth 2 (two) times daily. Continuous course until  05/25/16 11/24/15 05/25/16 Yes Thayer Headings, MD  Glucosamine-Chondroit-Vit C-Mn (GLUCOSAMINE-CHONDROITIN) CAPS Take 1 capsule by mouth daily.   Yes Historical Provider, MD  hydrochlorothiazide (MICROZIDE) 12.5 MG capsule TAKE 1 CAPSULE (12.5 MG TOTAL) BY MOUTH EVERY MORNING. Patient taking differently: Take 12.5 mg by mouth daily.  08/27/14  Yes Lelon Perla, MD  lisinopril (PRINIVIL,ZESTRIL) 40 MG tablet TAKE 1 TABLET (40 MG TOTAL) BY MOUTH DAILY. Patient taking differently: Take 40 mg by mouth daily.  08/27/14  Yes Lelon Perla, MD  Multiple Vitamin (MULITIVITAMIN WITH MINERALS) TABS Take 1 tablet by mouth daily.   Yes Historical Provider, MD  nitrofurantoin, macrocrystal-monohydrate, (MACROBID) 100 MG capsule Take 100 mg by mouth 2 (two)  times daily.   Yes Historical Provider, MD  polyethylene glycol (MIRALAX / GLYCOLAX) packet Take 17 g by mouth daily. Patient taking differently: Take 17 g by mouth daily as needed for moderate constipation.  11/17/15  Yes Shawn Montgomery Rayburn, PA-C  traMADol (ULTRAM) 50 MG tablet Take 50 mg by mouth every 12 (twelve) hours as needed for moderate pain.    Yes Historical Provider, MD  enoxaparin (LOVENOX) 30 MG/0.3ML injection Inject 0.3 mLs (30 mg total) into the skin daily. Patient taking differently: Inject 30 mg into the skin daily with supper.  11/17/15   Shawn Montgomery Rayburn, PA-C     Family History  Problem Relation Age of Onset  . Breast cancer Mother   . Leukemia Father   . Stroke Sister   . Bone cancer      Social History   Social History  .  Marital Status: Single    Spouse Name: N/A  . Number of Children: N/A  . Years of Education: N/A   Occupational History  . Retired    Social History Main Topics  . Smoking status: Former Smoker -- 0.50 packs/day for 50 years    Types: Cigarettes    Quit date: 09/28/2015  . Smokeless tobacco: Never Used     Comment: quit 09/27/15  . Alcohol Use: No  . Drug Use: No  . Sexual Activity: No   Other Topics Concern  . None   Social History Narrative   She has been living with her daughter when not in rehab since the surgery in March.    Review of Systems  Vital Signs: BP 141/58 mmHg  Pulse 91  Temp(Src) 98.2 F (36.8 C) (Oral)  Resp 22  Ht '5\' 2"'$  (1.575 m)  Wt 116 lb 6.5 oz (52.8 kg)  BMI 21.28 kg/m2  SpO2 94%  Physical Exam  Constitutional: She is oriented to person, place, and time.  Thin, frail  HENT:  Head: Atraumatic.  Herpetic blisters left upper lip  Neck: Neck supple.  Cardiovascular: Normal rate, regular rhythm and normal heart sounds.   Pulmonary/Chest: Effort normal and breath sounds normal. No respiratory distress.  Abdominal: Soft. Bowel sounds are normal.  Neurological: She is alert and  oriented to person, place, and time.  Skin: Skin is warm and dry.  Psychiatric: She has a normal mood and affect. Her behavior is normal. Judgment and thought content normal.  Vitals reviewed.   Mallampati Score:  MD Evaluation Airway: WNL Heart: WNL Abdomen: WNL Chest/ Lungs: WNL ASA  Classification: 3 Mallampati/Airway Score: Two  Imaging: Ct Abdomen Pelvis W Contrast  01/20/2016  CLINICAL DATA:  Right lower quadrant abdominal pain for 5 days with nausea and vomiting. EXAM: CT ABDOMEN AND PELVIS WITH CONTRAST TECHNIQUE: Multidetector CT imaging of the abdomen and pelvis was performed using the standard protocol following bolus administration of intravenous contrast. CONTRAST:  187m ISOVUE-300 IOPAMIDOL (ISOVUE-300) INJECTION 61% COMPARISON:  08/06/2005 CT abdomen/ pelvis. 02/14/2007 abdominal sonogram. FINDINGS: Lower chest: Centrilobular emphysema. Right middle lobe 3 mm pulmonary nodule (series 205/ image 6) is stable since 2007 and benign. Left main, left anterior descending, left circumflex and right coronary atherosclerosis. Hepatobiliary: Heterogeneous liver parenchymal attenuation, with geographic areas of low attenuation in the left liver lobe. No definite liver surface irregularity. No liver mass. Mild diffuse periportal edema. Numerous subcentimeter calcified gallstones layering in the nondistended gallbladder, with no definite gallbladder wall thickening or pericholecystic fluid. No biliary ductal dilatation. Pancreas: Normal, with no mass or duct dilation. Spleen: Normal size. No mass. Adrenals/Urinary Tract: Mild diffuse irregular thickening of both adrenal glands without discrete adrenal mass, not definitely changed back to 2007, suggesting adrenal hyperplasia . Probable 2 mm stone at the right ureterovesical junction, noting limited visualization of the pelvic segment of the right ureter due to streak artifact from the right hip hardware. Separate 3 mm stone in the mid right  pelvic ureter, with mild right hydroureteronephrosis. Additional nonobstructing 4 mm interpolar and 5 mm lower right renal stones. Exophytic simple 3.2 cm renal cyst in the anterior mid to upper right kidney. No left hydronephrosis. Bladder poorly visualized due to streak artifact, with no gross bladder abnormality. Stomach/Bowel: Grossly normal stomach. Normal caliber small bowel with no small bowel wall thickening. Normal appendix. Mild sigmoid diverticulosis, with no large bowel wall thickening or pericolonic fat stranding. Prominent rectal stool with rectal diameter 8.0 cm. No  definite rectal wall thickening. Vascular/Lymphatic: Atherosclerotic nonaneurysmal abdominal aorta. Insert vein No pathologically enlarged lymph nodes in the abdomen or pelvis. Reproductive: Status post hysterectomy, with no gross abnormal findings at the vaginal cuff. No adnexal mass. Other: No pneumoperitoneum, ascites or focal fluid collection. Musculoskeletal: No aggressive appearing focal osseous lesions. Right total hip arthroplasty is partially visualized. Moderate degenerative changes in the visualized thoracolumbar spine. Moderate to marked compression fracture of the T11 vertebral body of indeterminate chronicity, which appears new since the lateral 09/20/2015 chest radiograph. IMPRESSION: 1. Obstructing 2 mm right UVJ stone with mild right hydroureteronephrosis. Separate 3 mm stone in the mid right pelvic ureter. Additional nonobstructing right renal stones. 2. Suggestion of fecal impaction in the rectum. No definite rectal wall thickening. 3. Moderate to severe T11 vertebral compression fracture of indeterminate chronicity, which appears new since 09/20/2015. 4. Nonspecific liver parenchymal heterogeneity without discrete liver mass, favor hepatic steatosis, cannot exclude hepatic fibrosis. 5. Additional findings include left main and 3 vessel coronary atherosclerosis, centrilobular emphysema, mild sigmoid diverticulosis and  cholelithiasis. Electronically Signed   By: Ilona Sorrel M.D.   On: 01/20/2016 18:38   Dg Chest Port 1 View  01/22/2016  CLINICAL DATA:  Sepsis EXAM: PORTABLE CHEST 1 VIEW COMPARISON:  Portable exam 0502 hours compared 01/21/2016 FINDINGS: RIGHT jugular central venous catheter tip projecting over SVC. Minimal enlargement of cardiac silhouette. Calcified tortuous aorta. Mediastinal contours and pulmonary vascularity otherwise normal. Persistent infiltrates in mid to lower lungs bilaterally greater on RIGHT. Question underlying emphysematous changes. No gross pleural effusion or pneumothorax. IMPRESSION: Persistent pulmonary infiltrates. Aortic atherosclerosis. Electronically Signed   By: Lavonia Dana M.D.   On: 01/22/2016 08:41   Dg Chest Port 1 View  01/21/2016  CLINICAL DATA:  75 year old female with a history of hypoxia EXAM: PORTABLE CHEST 1 VIEW COMPARISON:  01/20/2016 FINDINGS: Cardiomediastinal silhouette unchanged, though partially obscured by overlying lung and pleural disease. Worsening interstitial and airspace opacities. Worsening interlobular septal thickening. Low lung volumes. Unchanged right IJ approach central venous catheter which appears to terminate superior vena cava. Calcifications of the aortic arch. No displaced fracture. IMPRESSION: Worsening interstitial and airspace opacities, which is most compatible with worsening edema and likely small pleural effusions. Underlying infection not excluded. Aortic atherosclerosis. Unchanged right IJ central catheter. Signed, Dulcy Fanny. Earleen Newport, DO [facility] Radiology Electronically Signed   By: Corrie Mckusick D.O.   On: 01/21/2016 21:31   Dg Chest Portable 1 View  01/20/2016  CLINICAL DATA:  Central line placement EXAM: PORTABLE CHEST 1 VIEW COMPARISON:  6/23/ 17 FINDINGS: Cardiomediastinal silhouette is stable. Streaky interstitial prominence/ pneumonitis bilateral lower lobe and right upper lobe  again noted. There is right IJ central line with tip in SVC. No pneumothorax. IMPRESSION: Right IJ central line in place.  No pneumothorax. Electronically Signed   By: Lahoma Crocker M.D.   On: 01/20/2016 20:51   Dg Chest Portable 1 View  01/20/2016  CLINICAL DATA:  Nausea and vomiting.  Pain. EXAM: PORTABLE CHEST 1 VIEW COMPARISON:  09/20/2015. FINDINGS: Mediastinum and hilar structures are normal. Mild bilateral upper lobe and bibasilar infiltrates. Small bilateral pleural effusions cannot be excluded. Heart size normal. Degenerative changes both shoulders. IMPRESSION: Mild bilateral upper lobe and bibasilar pulmonary infiltrates consistent with pneumonia. Electronically Signed   By: Marcello Moores  Register   On: 01/20/2016 17:00   Ir Nephrostomy Placement Right  01/21/2016  CLINICAL DATA:  Right ureteral obstruction from calculus causing hydronephrosis and acute sepsis. The patient  requires emergent percutaneous nephrostomy tube to decompress the right kidney. EXAM: 1. ULTRASOUND GUIDANCE FOR PUNCTURE OF THE RIGHT RENAL COLLECTING SYSTEM. 2. RIGHT PERCUTANEOUS NEPHROSTOMY TUBE PLACEMENT. COMPARISON:  CT of the abdomen and pelvis earlier today on 01/20/2016. ANESTHESIA/SEDATION: 1.0 mg IV Versed; 25 mcg IV Fentanyl. Total Moderate Sedation Time 15 minutes The patient's level of consciousness and physiologic status were continuously monitored during the procedure by Radiology nursing. CONTRAST:  20 ml Omnipaque 300 MEDICATIONS: No additional medications. Scheduled IV Fortaz and vancomycin were started just prior to the procedure. FLUOROSCOPY TIME:  1 minutes and 6 seconds. PROCEDURE: The procedure, risks, benefits, and alternatives were explained to the patient's son. Questions regarding the procedure were encouraged and answered. The patient's son understands and consents to the procedure. The right flank region was prepped with chlorhexidine in a sterile fashion, and a sterile drape was applied covering the operative  field. A sterile gown and sterile gloves were used for the procedure. Local anesthesia was provided with 1% Lidocaine. Ultrasound was used to localize the right kidney. Under direct ultrasound guidance, a 21 gauge needle was advanced into the renal collecting system. Ultrasound image documentation was performed. Aspiration of urine sample was performed followed by contrast injection. A transitional dilator was advanced over a guidewire. Percutaneous tract dilatation was then performed over the guidewire. A 10 -French percutaneous nephrostomy tube was then advanced and formed in the collecting system. Catheter position was confirmed by fluoroscopy after contrast injection. The catheter was secured at the skin with a Prolene retention suture and Stat-Lock device. A gravity bag was placed. COMPLICATIONS: None. FINDINGS: Ultrasound demonstrates moderate right hydronephrosis. After access of the collecting system, there was return of blood tinged urine. A sample was sent for culture analysis. The 10 French nephrostomy tube was formed in the renal pelvis. IMPRESSION: Right percutaneous nephrostomy tube placement for acute decompression of the right kidney. A 10 French catheter was placed and formed in the renal pelvis. This tube was attached to a gravity drainage bag. A sample of urine was sent for culture analysis. Electronically Signed   By: Aletta Edouard M.D.   On: 01/21/2016 09:12    Labs:  CBC:  Recent Labs  01/24/16 0342 01/25/16 0540 01/27/16 0531 01/28/16 0545  WBC 34.8* 20.3* 14.0* 13.5*  HGB 8.2* 8.5* 8.9* 8.2*  HCT 25.5* 26.0* 29.0* 27.5*  PLT 77* 80* 147* 183    COAGS:  Recent Labs  09/20/15 1558 01/04/16 1204 01/22/16 0020  INR 1.11 1.15 2.03*  APTT 37  --  47*    BMP:  Recent Labs  01/25/16 0540 01/26/16 0759 01/27/16 0531 01/28/16 0545  NA 143 140 137 138  K 3.8 3.3* 3.7 3.7  CL 107 107 106 106  CO2 '24 26 25 25  '$ GLUCOSE 85 91 111* 97  BUN '19 10 10 11  '$ CALCIUM  8.4* 8.0* 8.4* 8.4*  CREATININE 0.72 0.59 0.64 0.63  GFRNONAA >60 >60 >60 >60  GFRAA >60 >60 >60 >60    LIVER FUNCTION TESTS:  Recent Labs  09/20/15 1558 01/20/16 1538 01/24/16 0342  BILITOT 0.3 1.0 0.7  AST 22 115* 164*  ALT 18 68* 472*  ALKPHOS 115 255* 212*  PROT 7.4 6.9 4.9*  ALBUMIN 2.8* 2.6* 1.7*    TUMOR MARKERS: No results for input(s): AFPTM, CEA, CA199, CHROMGRNA in the last 8760 hours.  Assessment and Plan:  Hydronephrosis secondary to renal calculi  Will proceed with Nephrostogram and placement of internal ureteral stent on Monday.  Risks and Benefits discussed with the patient including, but not limited to infection, bleeding, significant bleeding causing loss or decrease in renal function or damage to adjacent structures.   All of the patient's questions were answered, patient is agreeable to proceed. Consent signed and in chart.  Electronically Signed: Murrell Redden PA-C 01/28/2016, 10:23 AM   I spent a total of 15 Minutes in face to face in clinical consultation, greater than 50% of which was counseling/coordinating care for nephrostogram and internalization of stent.

## 2016-01-28 NOTE — Progress Notes (Signed)
PROGRESS NOTE    MAKAYLIA HEWETT  YBW:[id number] DOB: Jan 11, 1941 DOA: 01/20/2016 PCP: Kirk Ruths, MD   Brief Narrative:  Mrs Deschamps is a 75 year old female with a past medical history of a total left knee arthroplasty seizure performed on 09/28/2015 by Dr. Sharol Given of orthopedic surgery, was complicated by infection undergoing removal of hardware and placement of antibiotic beads on 10/12/2015, procedure performed by Dr. Sharol Given. Unfortunately she had dehiscence of left knee arthroplasty undergoing irrigation and debridement on 10/18/2015. She was admitted to the pulmonary critical care service on 01/20/2016 presenting with abdominal pain, dysuria, nausea, vomiting, found have a temperature of 103, hypotensive, and required pressor support. She was further worked up with CT scan of abdomen and pelvis that revealed obstructing UVJ stone with associated hydroureteronephrosis. Urology was consulted, she underwent nephrostomy tube placement. Urine cultures and blood cultures both growing Proteus mirabilis, with repeat blood cultures performed on 01/23/2016 showing no growth to date. She is on ceftriaxone 2 g IV every 24 hours. During this hospitalization she was seen by her orthopedic surgeon Dr Sharol Given who recommended at least 4 weeks of antibiotic coverage. On 01/24/2016 her care was transferred from [facility] to medicine.    Assessment & Plan:   Principal Problem:   Septic shock ([place]) Active Problems:   Pressure ulcer   Elevated troponin   Abnormal echocardiogram   Hypoxia   Sepsis (HCC)   Obstructive uropathy   Hypokalemia   Acute on chronic systolic heart failure (HCC)   Herpetic lesions  1. Septic shock -Mrs. Cottier is a 75 year old female she presented in shock likely secondary to urosepsis, requiring IV pressor support and ICU admission. -Source of infection likely pyelonephritis/UTI, as CT scan revealed evidence of obstructive uropathy. Radiology reported obstructing 2 mm right UVJ stone  with associated hydronephrosis. -Both urine and blood cultures growing Proteus -She is now hemodynamically stable off of pressor support and has been transferred to the medicine service. -Susceptibility testing showing organism is sensitive to beta lactams. WBC trending down and currently at 20.3 from 57.1. -Discontinued ceftriaxone and started with ampicillin -Repeat blood cultures drawn on 01/23/2016 show no growth after overnight incubation. Patient has been transitioned to oral amoxicillin.   2. Obstructive uropathy. -She was admitted for urosepsis, with CT scan showing obstructing stone and right UVJ that was associated with hydronephrosis. -She underwent urostomy tube placement, which appears to be draining well. -Foley catheter remains in place -Urology was consulted during this hospitalization.  -Treating UTI with ampicillin IV. Changed IV ampicillin to oral amoxicillin.  Discussed obstructive uropathy and nephrostomy tubes with urology were recommending internalized in of [place] stents. IR has been contacted and this will be done on Monday, 01/30/2016. Will likely need to follow-up with urology and interventional radiology as outpatient.  3. History of left total knee arthroplasty -Complicated by infection -During this hospitalization she was seen by plastics and orthopedic surgery -Source of sepsis likely to be urinary -Dr. Sharol Given recommending one month of antibiotic therapy  4. Hypokalemia. -A.m. lab work showing potassium of 2.3 on 01/25/2016, she had been on Lasix therapy. -Replaced with by mouth and IV potassium. Repeat labs with potassium of 3.7. Monitor closely with diuresis.  5. History of hypertension. -Continue Coreg 3.125 mg by mouth twice a day  5. Acute systolic congestive heart failure -Transthoracic echocardiogram showing EF of 25-30% -She is clinically euvolemic, had been on Lasix 40 mg IV twice a day -Replacing potassium -Cardiology following recommending  repeating 2-D echo on 01/26/2016 with further  recommendations to follow pending 2-D echo outcome. 2-D echo done on 01/26/2016 with a EF of 35-40% with inferior, distal, apical and mid to distal septal hypokinesis. EF with some improvement from 01/22/2016 when EF was 25-30%. Patient has been transitioned to oral Lasix 20 mg daily. Continue Coreg. Per Cardiology will hold off on starting the ACE inhibitor due to patient's multiple comorbidities and increased risk of acute kidney injury.   6. Probable demand ischemia/Elevated troponin -Evaluated by cardiology, lab work revealed peak troponin of 1.53 -Cardiology recommended a repeat echo. Repeat 2-D echo was done 01/26/2016 which are EF of 35-40% with inferior, distal apical, apical and mid to distal septal hypokinesis. Ejection fraction improved from prior ejection fraction of 25-30%. This felt per cardiology that patient likely had a stress-induced cardiomyopathy. It was felt patient would not benefit from the addition of ACE inhibitor due to her multiple comorbidities and increased risk of acute kidney injury. IV heparin been discontinued. Continue medical management. Patient likely need repeat 2-D echo done approximately 3 months. Outpatient follow-up with cardiology.  7. Herpetic lesion oral mucosa Improved clinically after starting patient on Valtrex 1 g 3 times a day. Follow.    DVT prophylaxis: SCDs Code Status: Full Family Communication: Updated patient and daughter-in-law at bedside. Disposition Plan: Likely skilled nursing facility hopefully early next week after and JJ stent has been internalized.    Consultants:   Urology: Dr. Diona Fanti 01/20/2016  Interventional radiology Dr. Kathlene Cote 01/20/2016  Cardiology: Dr. Burt Knack 01/23/2016  Orthopedics: Dr. Sharol Given 01/24/2016  Plastic surgery  Procedures:   CT abdomen and pelvis 01/20/2016  Chest x-ray 01/20/2016, 01/21/2016, 01/22/2016  2-D echo 01/22/2016, 01/26/2016  Right  percutaneous nephrostomy tube placement Dr. Kathlene Cote 01/20/2016  Antimicrobials:   IV Fortaz 01/20/2016>>>> 01/23/2016  IV Rocephin 01/23/2016>>>> 01/24/2016  IV vancomycin 01/20/2016>>>> 01/22/2016  IV ampicillin 01/24/2016>>>>> 01/26/2016  Oral amoxicillin 01/26/2016   Subjective: Patient sleeping however easily arousable. Patient denies any chest pain. No shortness of breath. Patient tolerating current diet. Patient states has been able to urinate after Foley catheter was discontinued.  Objective: Filed Vitals:   01/28/16 0500 01/28/16 0528 01/28/16 0839 01/28/16 1509  BP:  128/57 141/58 136/56  Pulse:  73 91 95  Temp:  98.2 F (36.8 C)  98.4 F (36.9 C)  TempSrc:  Oral  Oral  Resp:  22  20  Height:      Weight: 52.8 kg (116 lb 6.5 oz)     SpO2:  94%  99%    Intake/Output Summary (Last 24 hours) at 01/28/16 1523 Last data filed at 01/28/16 0645  Gross per 24 hour  Intake    250 ml  Output    625 ml  Net   -375 ml   Filed Weights   01/25/16 0552 01/26/16 0459 01/28/16 0500  Weight: 53.5 kg (117 lb 15.1 oz) 54.3 kg (119 lb 11.4 oz) 52.8 kg (116 lb 6.5 oz)    Examination:  General exam: Appears calm and comfortable. Herpetic lesions on the oral mucosa improving. Respiratory system: Bibasilar crackles. Respiratory effort normal. Cardiovascular system: S1 & S2 heard, RRR. No JVD, murmurs, rubs, gallops or clicks. No pedal edema. Gastrointestinal system: Abdomen is nondistended, soft and nontender. No organomegaly or masses felt. Normal bowel sounds heard. Central nervous system: Alert and oriented. No focal neurological deficits. Extremities: Left lower extremity with bandage and in immobilizer. Trace bilateral lower extremity edema. Skin: No rashes, lesions or ulcers Psychiatry: Judgement and insight appear normal. Mood & affect appropriate.  Data Reviewed: I have personally reviewed following labs and imaging studies  CBC:  Recent Labs Lab  01/23/16 0400 01/24/16 0342 01/25/16 0540 01/27/16 0531 01/28/16 0545  WBC 53.3* 34.8* 20.3* 14.0* 13.5*  NEUTROABS  --  32.4*  --   --   --   HGB 8.1* 8.2* 8.5* 8.9* 8.2*  HCT 25.5* 25.5* 26.0* 29.0* 27.5*  MCV 83.3 82.0 87.5 86.6 86.5  PLT 89* 77* 80* 147* 606   Basic Metabolic Panel:  Recent Labs Lab 01/24/16 0342 01/25/16 0540 01/26/16 0759 01/27/16 0531 01/28/16 0545  NA 141 143 140 137 138  K 2.3* 3.8 3.3* 3.7 3.7  CL 103 107 107 106 106  CO2 '28 24 26 25 25  '$ GLUCOSE 99 85 91 111* 97  BUN 29* '19 10 10 11  '$ CREATININE 0.95 0.72 0.59 0.64 0.63  CALCIUM 8.3* 8.4* 8.0* 8.4* 8.4*   GFR: Estimated Creatinine Clearance: 48.8 mL/min (by C-G formula based on Cr of 0.63). Liver Function Tests:  Recent Labs Lab 01/24/16 0342  AST 164*  ALT 472*  ALKPHOS 212*  BILITOT 0.7  PROT 4.9*  ALBUMIN 1.7*   No results for input(s): LIPASE, AMYLASE in the last 168 hours. No results for input(s): AMMONIA in the last 168 hours. Coagulation Profile:  Recent Labs Lab 01/22/16 0020  INR 2.03*   Cardiac Enzymes:  Recent Labs Lab 01/21/16 2100 01/22/16 0020 01/22/16 0832 01/22/16 1230 01/22/16 1500  CKTOTAL  --  352*  --   --   --   CKMB  --  12.1*  --   --   --   TROPONINI 0.52* 0.79* 1.36* 1.52* 1.53*   BNP (last 3 results) No results for input(s): PROBNP in the last 8760 hours. HbA1C: No results for input(s): HGBA1C in the last 72 hours. CBG:  Recent Labs Lab 01/27/16 1944 01/27/16 2348 01/28/16 0404 01/28/16 0744 01/28/16 1152  GLUCAP 143* 87 94 104* 132*   Lipid Profile: No results for input(s): CHOL, HDL, LDLCALC, TRIG, CHOLHDL, LDLDIRECT in the last 72 hours. Thyroid Function Tests: No results for input(s): TSH, T4TOTAL, FREET4, T3FREE, THYROIDAB in the last 72 hours. Anemia Panel: No results for input(s): VITAMINB12, FOLATE, FERRITIN, TIBC, IRON, RETICCTPCT in the last 72 hours. Sepsis Labs:  Recent Labs Lab 01/22/16 0400 01/23/16 0400   PROCALCITON >175.00 173.23    Recent Results (from the past 240 hour(s))  Blood Culture (routine x 2)     Status: Abnormal   Collection Time: 01/20/16  4:30 PM  Result Value Ref Range Status   Specimen Description BLOOD RIGHT HAND  Final   Special Requests BOTTLES DRAWN AEROBIC ONLY 5CC  Final   Culture  Setup Time   Final    GRAM NEGATIVE RODS AEROBIC BOTTLE ONLY CRITICAL RESULT CALLED TO, READ BACK BY AND VERIFIED WITH: K COMBS,PHARMD AT 0743 01/21/16 BY L BENFIELD    Culture (A)  Final    PROTEUS MIRABILIS SUSCEPTIBILITIES PERFORMED ON PREVIOUS CULTURE WITHIN THE LAST 5 DAYS.    Report Status 01/23/2016 FINAL  Final  Blood Culture (routine x 2)     Status: Abnormal   Collection Time: 01/20/16  4:40 PM  Result Value Ref Range Status   Specimen Description BLOOD LEFT ANTECUBITAL  Final   Special Requests BOTTLES DRAWN AEROBIC AND ANAEROBIC 5CC  Final   Culture  Setup Time   Final    GRAM NEGATIVE RODS IN BOTH AEROBIC AND ANAEROBIC BOTTLES CRITICAL RESULT CALLED TO, READ BACK BY  AND VERIFIED WITH: K COMBS,PHARMD AT 7564 01/21/16 BY L BENFIELD    Culture PROTEUS MIRABILIS (A)  Final   Report Status 01/23/2016 FINAL  Final   Organism ID, Bacteria PROTEUS MIRABILIS  Final      Susceptibility   Proteus mirabilis - MIC*    AMPICILLIN <=2 SENSITIVE Sensitive     CEFAZOLIN <=4 SENSITIVE Sensitive     CEFEPIME <=1 SENSITIVE Sensitive     CEFTAZIDIME <=1 SENSITIVE Sensitive     CEFTRIAXONE <=1 SENSITIVE Sensitive     CIPROFLOXACIN <=0.25 SENSITIVE Sensitive     GENTAMICIN <=1 SENSITIVE Sensitive     IMIPENEM 2 SENSITIVE Sensitive     TRIMETH/SULFA <=20 SENSITIVE Sensitive     AMPICILLIN/SULBACTAM <=2 SENSITIVE Sensitive     PIP/TAZO <=4 SENSITIVE Sensitive     * PROTEUS MIRABILIS  Blood Culture ID Panel (Reflexed)     Status: Abnormal   Collection Time: 01/20/16  4:40 PM  Result Value Ref Range Status   Enterococcus species NOT DETECTED NOT DETECTED Final   Vancomycin  resistance NOT DETECTED NOT DETECTED Final   Listeria monocytogenes NOT DETECTED NOT DETECTED Final   Staphylococcus species NOT DETECTED NOT DETECTED Final   Staphylococcus aureus NOT DETECTED NOT DETECTED Final   Methicillin resistance NOT DETECTED NOT DETECTED Final   Streptococcus species NOT DETECTED NOT DETECTED Final   Streptococcus agalactiae NOT DETECTED NOT DETECTED Final   Streptococcus pneumoniae NOT DETECTED NOT DETECTED Final   Streptococcus pyogenes NOT DETECTED NOT DETECTED Final   Acinetobacter baumannii NOT DETECTED NOT DETECTED Final   Enterobacteriaceae species DETECTED (A) NOT DETECTED Final    Comment: CRITICAL RESULT CALLED TO, READ BACK BY AND VERIFIED WITH: K COMBS,PHARMD AT 0743 01/21/16 BY L BENFIELD    Enterobacter cloacae complex NOT DETECTED NOT DETECTED Final   Escherichia coli NOT DETECTED NOT DETECTED Final   Klebsiella oxytoca NOT DETECTED NOT DETECTED Final   Klebsiella pneumoniae NOT DETECTED NOT DETECTED Final   Proteus species DETECTED (A) NOT DETECTED Final    Comment: CRITICAL RESULT CALLED TO, READ BACK BY AND VERIFIED WITH: K COMBS,PHARMD AT 0743 01/21/16 BY L BENFIELD    Serratia marcescens NOT DETECTED NOT DETECTED Final   Carbapenem resistance NOT DETECTED NOT DETECTED Final   Haemophilus influenzae NOT DETECTED NOT DETECTED Final   Neisseria meningitidis NOT DETECTED NOT DETECTED Final   Pseudomonas aeruginosa NOT DETECTED NOT DETECTED Final   Candida albicans NOT DETECTED NOT DETECTED Final   Candida glabrata NOT DETECTED NOT DETECTED Final   Candida krusei NOT DETECTED NOT DETECTED Final   Candida parapsilosis NOT DETECTED NOT DETECTED Final   Candida tropicalis NOT DETECTED NOT DETECTED Final  Urine culture     Status: Abnormal   Collection Time: 01/20/16  6:32 PM  Result Value Ref Range Status   Specimen Description URINE, CATHETERIZED  Final   Special Requests NONE  Final   Culture 7,000 COLONIES/mL INSIGNIFICANT GROWTH (A)  Final    Report Status 01/22/2016 FINAL  Final  Urine culture     Status: Abnormal   Collection Time: 01/20/16 10:06 PM  Result Value Ref Range Status   Specimen Description URINE, CATHETERIZED  Final   Special Requests RIGHT KIDNEY  Final   Culture 10,000 COLONIES/mL PROTEUS MIRABILIS (A)  Final   Report Status 01/23/2016 FINAL  Final   Organism ID, Bacteria PROTEUS MIRABILIS (A)  Final      Susceptibility   Proteus mirabilis - MIC*    AMPICILLIN <=2  SENSITIVE Sensitive     CEFAZOLIN <=4 SENSITIVE Sensitive     CEFTRIAXONE <=1 SENSITIVE Sensitive     CIPROFLOXACIN <=0.25 SENSITIVE Sensitive     GENTAMICIN <=1 SENSITIVE Sensitive     IMIPENEM 1 SENSITIVE Sensitive     NITROFURANTOIN 128 RESISTANT Resistant     TRIMETH/SULFA <=20 SENSITIVE Sensitive     AMPICILLIN/SULBACTAM <=2 SENSITIVE Sensitive     PIP/TAZO <=4 SENSITIVE Sensitive     * 10,000 COLONIES/mL PROTEUS MIRABILIS  MRSA PCR Screening     Status: None   Collection Time: 01/21/16  1:00 AM  Result Value Ref Range Status   MRSA by PCR NEGATIVE NEGATIVE Final    Comment:        The GeneXpert MRSA Assay (FDA approved for NASAL specimens only), is one component of a comprehensive MRSA colonization surveillance program. It is not intended to diagnose MRSA infection nor to guide or monitor treatment for MRSA infections.   Culture, blood (Routine X 2) w Reflex to ID Panel     Status: None (Preliminary result)   Collection Time: 01/23/16 11:10 AM  Result Value Ref Range Status   Specimen Description BLOOD LEFT HAND  Final   Special Requests IN PEDIATRIC BOTTLE 1 CC  Final   Culture NO GROWTH 4 DAYS  Final   Report Status PENDING  Incomplete  Culture, blood (Routine X 2) w Reflex to ID Panel     Status: None (Preliminary result)   Collection Time: 01/23/16 11:11 AM  Result Value Ref Range Status   Specimen Description BLOOD RIGHT HAND  Final   Special Requests IN PEDIATRIC BOTTLE 3 CC  Final   Culture NO GROWTH 4 DAYS  Final    Report Status PENDING  Incomplete         Radiology Studies: No results found.      Scheduled Meds: . amoxicillin  500 mg Oral Q8H  . antiseptic oral rinse  7 mL Mouth Rinse q12n4p  . carvedilol  3.125 mg Oral BID WC  . chlorhexidine  15 mL Mouth Rinse BID  . enoxaparin (LOVENOX) injection  40 mg Subcutaneous Q24H  . feeding supplement (ENSURE ENLIVE)  237 mL Oral TID BM  . furosemide  20 mg Oral Daily  . insulin aspart  0-9 Units Subcutaneous Q4H  . multivitamin with minerals  1 tablet Oral Daily  . valACYclovir  1,000 mg Oral TID   Continuous Infusions:     LOS: 8 days    Time spent: 72 minutes    Randy Castrejon, MD [facility] Pager [phone]  If 7PM-7AM, please contact night-coverage [email or web address] Password [facility] 01/28/2016, 3:23 PM

## 2016-01-29 LAB — MAGNESIUM: Magnesium: 1.6 mg/dL — ABNORMAL LOW (ref 1.7–2.4)

## 2016-01-29 LAB — GLUCOSE, CAPILLARY
GLUCOSE-CAPILLARY: 118 mg/dL — AB (ref 65–99)
GLUCOSE-CAPILLARY: 109 mg/dL — AB (ref 65–99)
GLUCOSE-CAPILLARY: 234 mg/dL — AB (ref 65–99)
GLUCOSE-CAPILLARY: 166 mg/dL — AB (ref 65–99)
Glucose-Capillary: 112 mg/dL — ABNORMAL HIGH (ref 65–99)
Glucose-Capillary: 120 mg/dL — ABNORMAL HIGH (ref 65–99)

## 2016-01-29 LAB — BASIC METABOLIC PANEL
ANION GAP: 9 (ref 5–15)
BUN: 12 mg/dL (ref 6–20)
CALCIUM: 8.5 mg/dL — AB (ref 8.9–10.3)
CO2: 24 mmol/L (ref 22–32)
CREATININE: 0.67 mg/dL (ref 0.44–1.00)
Chloride: 104 mmol/L (ref 101–111)
GFR calc non Af Amer: >60 mL/min (ref >60)
GLUCOSE: 109 mg/dL — AB (ref 65–99)
Potassium: 3.7 mmol/L (ref 3.5–5.1)
SODIUM: 137 mmol/L (ref 135–145)

## 2016-01-29 MED ORDER — ENOXAPARIN SODIUM 40 MG/0.4ML ~~LOC~~ SOLN
40.0000 mg | SUBCUTANEOUS | Status: DC
  Administered 2016-01-31 – 2016-02-01 (×2): 40 mg via SUBCUTANEOUS
  Filled 2016-01-29 (×2): qty 0.4

## 2016-01-29 MED ORDER — MAGNESIUM OXIDE 400 (241.3 MG) MG PO TABS
400.0000 mg | ORAL_TABLET | Freq: Two times a day (BID) | ORAL | Status: DC
  Administered 2016-01-29 – 2016-02-01 (×7): 400 mg via ORAL
  Filled 2016-01-29 (×7): qty 1

## 2016-01-29 MED ORDER — TRAMADOL HCL 50 MG PO TABS
50.0000 mg | ORAL_TABLET | Freq: Four times a day (QID) | ORAL | Status: DC | PRN
  Administered 2016-01-29 – 2016-02-01 (×8): 50 mg via ORAL
  Filled 2016-01-29 (×8): qty 1

## 2016-01-29 MED ORDER — MAGNESIUM SULFATE 4 GM/100ML IV SOLN
4.0000 g | Freq: Once | INTRAVENOUS | Status: AC
  Administered 2016-01-29: 4 g via INTRAVENOUS
  Filled 2016-01-29: qty 100

## 2016-01-29 NOTE — Progress Notes (Signed)
PROGRESS NOTE    [name]  ZOX:[id number] DOB: June 13, 1941 DOA: 01/20/2016 PCP: Kirk Ruths, MD   Brief Narrative:  [name] is a 75 year old female with a past medical history of a total left knee arthroplasty seizure performed on 09/28/2015 by Dr. Sharol Given of orthopedic surgery, was complicated by infection undergoing removal of hardware and placement of antibiotic beads on 10/12/2015, procedure performed by Dr. Sharol Given. Unfortunately she had dehiscence of left knee arthroplasty undergoing irrigation and debridement on 10/18/2015. She was admitted to the pulmonary critical care service on 01/20/2016 presenting with abdominal pain, dysuria, nausea, vomiting, found have a temperature of 103, hypotensive, and required pressor support. She was further worked up with CT scan of abdomen and pelvis that revealed obstructing UVJ stone with associated hydroureteronephrosis. Urology was consulted, she underwent nephrostomy tube placement. Urine cultures and blood cultures both growing Proteus mirabilis, with repeat blood cultures performed on 01/23/2016 showing no growth to date. She is on ceftriaxone 2 g IV every 24 hours. During this hospitalization she was seen by her orthopedic surgeon Dr Sharol Given who recommended at least 4 weeks of antibiotic coverage. On 01/24/2016 her care was transferred from [facility] to medicine.    Assessment & Plan:   Principal Problem:   Septic shock ([place]) Active Problems:   Pressure ulcer   Elevated troponin   Abnormal echocardiogram   Hypoxia   Sepsis ([place])   Obstructive uropathy   Hypokalemia   Acute on chronic systolic heart failure (HCC)   Herpetic lesions   Hydronephrosis  1. Septic shock -Courtney. James is a 76 year old female she presented in shock likely secondary to urosepsis, requiring IV pressor support and ICU admission. -Source of infection likely pyelonephritis/UTI, as CT scan revealed evidence of obstructive uropathy. Radiology reported obstructing 2 mm  right UVJ stone with associated hydronephrosis. -Both urine and blood cultures growing Proteus -She is now hemodynamically stable off of pressor support and has been transferred to the medicine service. -Susceptibility testing showing organism is sensitive to beta lactams. WBC trending down and currently at 13.5 from 14.0 from 20.3 from 57.1. -Discontinued ceftriaxone and started with ampicillin -Repeat blood cultures drawn on 01/23/2016 show no growth after overnight incubation. Patient has been transitioned to oral amoxicillin.   2. Obstructive uropathy. -She was admitted for urosepsis, with CT scan showing obstructing stone and right UVJ that was associated with hydronephrosis. -She underwent urostomy tube placement, which appears to be draining well. -Foley catheter remains in place -Urology was consulted during this hospitalization.  -Treating UTI with ampicillin IV. Changed IV ampicillin to oral amoxicillin.  Discussed obstructive uropathy and nephrostomy tubes with urology were recommending internalized in of [place] stents. IR has been contacted and this will be done on Monday, 01/30/2016. Will likely need to follow-up with urology and interventional radiology as outpatient.  3. History of left total knee arthroplasty -Complicated by infection -During this hospitalization she was seen by plastics and orthopedic surgery -Source of sepsis likely to be urinary -Dr. Sharol Given recommending one month of antibiotic therapy  4. Hypokalemia/hypomagnesemia -A.m. lab work showing potassium of 2.3 on 01/25/2016, she had been on Lasix therapy. -Replaced with by mouth and IV potassium. Repeat labs with potassium of 3.7. Monitor closely with diuresis. Replete magnesium.  5. History of hypertension. -Continue Coreg 3.125 mg by mouth twice a day.  5. Acute systolic congestive heart failure -Transthoracic echocardiogram showing EF of 25-30% -She is clinically euvolemic, had been on Lasix 40 mg IV  twice a day -Replacing potassium -Cardiology  following recommending repeating 2-D echo done on 01/26/2016 with a EF of 35-40% with inferior, distal, apical and mid to distal septal hypokinesis. EF with some improvement from 01/22/2016 when EF was 25-30%. Patient has been transitioned to oral Lasix 20 mg daily. Continue Coreg. Per Cardiology will hold off on starting the ACE inhibitor due to patient's multiple comorbidities and increased risk of acute kidney injury.   6. Probable demand ischemia/Elevated troponin -Evaluated by cardiology, lab work revealed peak troponin of 1.53 -Cardiology recommended a repeat echo. Repeat 2-D echo was done 01/26/2016 which are EF of 35-40% with inferior, distal apical, apical and mid to distal septal hypokinesis. Ejection fraction improved from prior ejection fraction of 25-30%. This felt per cardiology that patient likely had a stress-induced cardiomyopathy. It was felt patient would not benefit from the addition of ACE inhibitor due to her multiple comorbidities and increased risk of acute kidney injury. IV heparin been discontinued. Continue medical management. Patient likely need repeat 2-D echo done approximately 3 months. Outpatient follow-up with cardiology.  7. Herpetic lesion oral mucosa Improving after starting patient on Valtrex 1 g 3 times a day. Follow.    DVT prophylaxis: SCDs Code Status: Full Family Communication: Updated patient. No family at bedside.  Disposition Plan: Likely skilled nursing facility hopefully early next week after and JJ stent has been internalized.    Consultants:   Urology: Dr. Diona Fanti 01/20/2016  Interventional radiology Dr. Kathlene Cote 01/20/2016  Cardiology: Dr. Burt Knack 01/23/2016  Orthopedics: Dr. Sharol Given 01/24/2016  Plastic surgery  Procedures:   CT abdomen and pelvis 01/20/2016  Chest x-ray 01/20/2016, 01/21/2016, 01/22/2016  2-D echo 01/22/2016, 01/26/2016  Right percutaneous nephrostomy tube placement  Dr. Kathlene Cote 01/20/2016  Antimicrobials:   IV Fortaz 01/20/2016>>>> 01/23/2016  IV Rocephin 01/23/2016>>>> 01/24/2016  IV vancomycin 01/20/2016>>>> 01/22/2016  IV ampicillin 01/24/2016>>>>> 01/26/2016  Oral amoxicillin 01/26/2016   Subjective: Patient sitting up in bed. No chest pain. No shortness of breath. Feeling better.  Objective: Filed Vitals:   01/28/16 1509 01/28/16 1811 01/29/16 0506 01/29/16 1006  BP: 136/56 145/61 132/50 122/58  Pulse: 95 96 53 93  Temp: 98.4 F (36.9 C)  98.7 F (37.1 C)   TempSrc: Oral  Oral   Resp: 20     Height:      Weight:   52.5 kg (115 lb 11.9 oz)   SpO2: 99%  98%     Intake/Output Summary (Last 24 hours) at 01/29/16 1250 Last data filed at 01/29/16 0307  Gross per 24 hour  Intake      0 ml  Output    350 ml  Net   -350 ml   Filed Weights   01/26/16 0459 01/28/16 0500 01/29/16 0506  Weight: 54.3 kg (119 lb 11.4 oz) 52.8 kg (116 lb 6.5 oz) 52.5 kg (115 lb 11.9 oz)    Examination:  General exam: Appears calm and comfortable. Herpetic lesions on the oral mucosa improving. Respiratory system: CTAB. Respiratory effort normal. Cardiovascular system: S1 & S2 heard, RRR. No JVD, murmurs, rubs, gallops or clicks. No pedal edema. Gastrointestinal system: Abdomen is nondistended, soft and nontender. No organomegaly or masses felt. Normal bowel sounds heard. Central nervous system: Alert and oriented. No focal neurological deficits. Extremities: Left lower extremity with bandage and in immobilizer. No bilateral lower extremity edema. Skin: Herpetic lesions on oral mucosa improving. Psychiatry: Judgement and insight appear normal. Mood & affect appropriate.     Data Reviewed: I have personally reviewed following labs and imaging studies  CBC:  Recent Labs Lab 01/23/16 0400 01/24/16 0342 01/25/16 0540 01/27/16 0531 01/28/16 0545  WBC 53.3* 34.8* 20.3* 14.0* 13.5*  NEUTROABS  --  32.4*  --   --   --   HGB 8.1* 8.2* 8.5* 8.9*  8.2*  HCT 25.5* 25.5* 26.0* 29.0* 27.5*  MCV 83.3 82.0 87.5 86.6 86.5  PLT 89* 77* 80* 147* 973   Basic Metabolic Panel:  Recent Labs Lab 01/25/16 0540 01/26/16 0759 01/27/16 0531 01/28/16 0545 01/29/16 0404  NA 143 140 137 138 137  K 3.8 3.3* 3.7 3.7 3.7  CL 107 107 106 106 104  CO2 '24 26 25 25 24  '$ GLUCOSE 85 91 111* 97 109*  BUN '19 10 10 11 12  '$ CREATININE 0.72 0.59 0.64 0.63 0.67  CALCIUM 8.4* 8.0* 8.4* 8.4* 8.5*  MG  --   --   --   --  1.6*   GFR: Estimated Creatinine Clearance: 48.8 mL/min (by C-G formula based on Cr of 0.67). Liver Function Tests:  Recent Labs Lab 01/24/16 0342  AST 164*  ALT 472*  ALKPHOS 212*  BILITOT 0.7  PROT 4.9*  ALBUMIN 1.7*   No results for input(s): LIPASE, AMYLASE in the last 168 hours. No results for input(s): AMMONIA in the last 168 hours. Coagulation Profile: No results for input(s): INR, PROTIME in the last 168 hours. Cardiac Enzymes:  Recent Labs Lab 01/22/16 1500  TROPONINI 1.53*   BNP (last 3 results) No results for input(s): PROBNP in the last 8760 hours. HbA1C: No results for input(s): HGBA1C in the last 72 hours. CBG:  Recent Labs Lab 01/28/16 2024 01/29/16 0006 01/29/16 0423 01/29/16 0819 01/29/16 1205  GLUCAP 109* 120* 112* 118* 109*   Lipid Profile: No results for input(s): CHOL, HDL, LDLCALC, TRIG, CHOLHDL, LDLDIRECT in the last 72 hours. Thyroid Function Tests: No results for input(s): TSH, T4TOTAL, FREET4, T3FREE, THYROIDAB in the last 72 hours. Anemia Panel: No results for input(s): VITAMINB12, FOLATE, FERRITIN, TIBC, IRON, RETICCTPCT in the last 72 hours. Sepsis Labs:  Recent Labs Lab 01/23/16 0400  PROCALCITON 173.23    Recent Results (from the past 240 hour(s))  Blood Culture (routine x 2)     Status: Abnormal   Collection Time: 01/20/16  4:30 PM  Result Value Ref Range Status   Specimen Description BLOOD RIGHT HAND  Final   Special Requests BOTTLES DRAWN AEROBIC ONLY 5CC  Final    Culture  Setup Time   Final    GRAM NEGATIVE RODS AEROBIC BOTTLE ONLY CRITICAL RESULT CALLED TO, READ BACK BY AND VERIFIED WITH: K COMBS,PHARMD AT 5329 01/21/16 BY L BENFIELD    Culture (A)  Final    PROTEUS MIRABILIS SUSCEPTIBILITIES PERFORMED ON PREVIOUS CULTURE WITHIN THE LAST 5 DAYS.    Report Status 01/23/2016 FINAL  Final  Blood Culture (routine x 2)     Status: Abnormal   Collection Time: 01/20/16  4:40 PM  Result Value Ref Range Status   Specimen Description BLOOD LEFT ANTECUBITAL  Final   Special Requests BOTTLES DRAWN AEROBIC AND ANAEROBIC 5CC  Final   Culture  Setup Time   Final    GRAM NEGATIVE RODS IN BOTH AEROBIC AND ANAEROBIC BOTTLES CRITICAL RESULT CALLED TO, READ BACK BY AND VERIFIED WITH: K COMBS,PHARMD AT 0743 01/21/16 BY L BENFIELD    Culture PROTEUS MIRABILIS (A)  Final   Report Status 01/23/2016 FINAL  Final   Organism ID, Bacteria PROTEUS MIRABILIS  Final      Susceptibility  Proteus mirabilis - MIC*    AMPICILLIN <=2 SENSITIVE Sensitive     CEFAZOLIN <=4 SENSITIVE Sensitive     CEFEPIME <=1 SENSITIVE Sensitive     CEFTAZIDIME <=1 SENSITIVE Sensitive     CEFTRIAXONE <=1 SENSITIVE Sensitive     CIPROFLOXACIN <=0.25 SENSITIVE Sensitive     GENTAMICIN <=1 SENSITIVE Sensitive     IMIPENEM 2 SENSITIVE Sensitive     TRIMETH/SULFA <=20 SENSITIVE Sensitive     AMPICILLIN/SULBACTAM <=2 SENSITIVE Sensitive     PIP/TAZO <=4 SENSITIVE Sensitive     * PROTEUS MIRABILIS  Blood Culture ID Panel (Reflexed)     Status: Abnormal   Collection Time: 01/20/16  4:40 PM  Result Value Ref Range Status   Enterococcus species NOT DETECTED NOT DETECTED Final   Vancomycin resistance NOT DETECTED NOT DETECTED Final   Listeria monocytogenes NOT DETECTED NOT DETECTED Final   Staphylococcus species NOT DETECTED NOT DETECTED Final   Staphylococcus aureus NOT DETECTED NOT DETECTED Final   Methicillin resistance NOT DETECTED NOT DETECTED Final   Streptococcus species NOT DETECTED NOT  DETECTED Final   Streptococcus agalactiae NOT DETECTED NOT DETECTED Final   Streptococcus pneumoniae NOT DETECTED NOT DETECTED Final   Streptococcus pyogenes NOT DETECTED NOT DETECTED Final   Acinetobacter baumannii NOT DETECTED NOT DETECTED Final   Enterobacteriaceae species DETECTED (A) NOT DETECTED Final    Comment: CRITICAL RESULT CALLED TO, READ BACK BY AND VERIFIED WITH: K COMBS,PHARMD AT 0743 01/21/16 BY L BENFIELD    Enterobacter cloacae complex NOT DETECTED NOT DETECTED Final   Escherichia coli NOT DETECTED NOT DETECTED Final   Klebsiella oxytoca NOT DETECTED NOT DETECTED Final   Klebsiella pneumoniae NOT DETECTED NOT DETECTED Final   Proteus species DETECTED (A) NOT DETECTED Final    Comment: CRITICAL RESULT CALLED TO, READ BACK BY AND VERIFIED WITH: K COMBS,PHARMD AT 0743 01/21/16 BY L BENFIELD    Serratia marcescens NOT DETECTED NOT DETECTED Final   Carbapenem resistance NOT DETECTED NOT DETECTED Final   Haemophilus influenzae NOT DETECTED NOT DETECTED Final   Neisseria meningitidis NOT DETECTED NOT DETECTED Final   Pseudomonas aeruginosa NOT DETECTED NOT DETECTED Final   Candida albicans NOT DETECTED NOT DETECTED Final   Candida glabrata NOT DETECTED NOT DETECTED Final   Candida krusei NOT DETECTED NOT DETECTED Final   Candida parapsilosis NOT DETECTED NOT DETECTED Final   Candida tropicalis NOT DETECTED NOT DETECTED Final  Urine culture     Status: Abnormal   Collection Time: 01/20/16  6:32 PM  Result Value Ref Range Status   Specimen Description URINE, CATHETERIZED  Final   Special Requests NONE  Final   Culture 7,000 COLONIES/mL INSIGNIFICANT GROWTH (A)  Final   Report Status 01/22/2016 FINAL  Final  Urine culture     Status: Abnormal   Collection Time: 01/20/16 10:06 PM  Result Value Ref Range Status   Specimen Description URINE, CATHETERIZED  Final   Special Requests RIGHT KIDNEY  Final   Culture 10,000 COLONIES/mL PROTEUS MIRABILIS (A)  Final   Report Status  01/23/2016 FINAL  Final   Organism ID, Bacteria PROTEUS MIRABILIS (A)  Final      Susceptibility   Proteus mirabilis - MIC*    AMPICILLIN <=2 SENSITIVE Sensitive     CEFAZOLIN <=4 SENSITIVE Sensitive     CEFTRIAXONE <=1 SENSITIVE Sensitive     CIPROFLOXACIN <=0.25 SENSITIVE Sensitive     GENTAMICIN <=1 SENSITIVE Sensitive     IMIPENEM 1 SENSITIVE Sensitive  NITROFURANTOIN 128 RESISTANT Resistant     TRIMETH/SULFA <=20 SENSITIVE Sensitive     AMPICILLIN/SULBACTAM <=2 SENSITIVE Sensitive     PIP/TAZO <=4 SENSITIVE Sensitive     * 10,000 COLONIES/mL PROTEUS MIRABILIS  MRSA PCR Screening     Status: None   Collection Time: 01/21/16  1:00 AM  Result Value Ref Range Status   MRSA by PCR NEGATIVE NEGATIVE Final    Comment:        The GeneXpert MRSA Assay (FDA approved for NASAL specimens only), is one component of a comprehensive MRSA colonization surveillance program. It is not intended to diagnose MRSA infection nor to guide or monitor treatment for MRSA infections.   Culture, blood (Routine X 2) w Reflex to ID Panel     Status: None   Collection Time: 01/23/16 11:10 AM  Result Value Ref Range Status   Specimen Description BLOOD LEFT HAND  Final   Special Requests IN PEDIATRIC BOTTLE 1 CC  Final   Culture NO GROWTH 5 DAYS  Final   Report Status 01/28/2016 FINAL  Final  Culture, blood (Routine X 2) w Reflex to ID Panel     Status: None   Collection Time: 01/23/16 11:11 AM  Result Value Ref Range Status   Specimen Description BLOOD RIGHT HAND  Final   Special Requests IN PEDIATRIC BOTTLE 3 CC  Final   Culture NO GROWTH 5 DAYS  Final   Report Status 01/28/2016 FINAL  Final         Radiology Studies: No results found.      Scheduled Meds: . amoxicillin  500 mg Oral Q8H  . antiseptic oral rinse  7 mL Mouth Rinse q12n4p  . carvedilol  3.125 mg Oral BID WC  . chlorhexidine  15 mL Mouth Rinse BID  . [START ON 01/31/2016] enoxaparin (LOVENOX) injection  40 mg  Subcutaneous Q24H  . feeding supplement (ENSURE ENLIVE)  237 mL Oral TID BM  . furosemide  20 mg Oral Daily  . insulin aspart  0-9 Units Subcutaneous Q4H  . magnesium oxide  400 mg Oral BID  . multivitamin with minerals  1 tablet Oral Daily  . valACYclovir  1,000 mg Oral TID   Continuous Infusions:     LOS: 9 days    Time spent: 14 minutes    Sameena Artus, MD [facility] Pager [phone]  If 7PM-7AM, please contact night-coverage [email or web address] Password [email or web address] 01/29/2016, 12:50 PM

## 2016-01-30 ENCOUNTER — Inpatient Hospital Stay (HOSPITAL_COMMUNITY): Payer: Medicare Other

## 2016-01-30 DIAGNOSIS — Q620 Congenital hydronephrosis: Secondary | ICD-10-CM

## 2016-01-30 LAB — GLUCOSE, CAPILLARY
GLUCOSE-CAPILLARY: 134 mg/dL — AB (ref 65–99)
GLUCOSE-CAPILLARY: 125 mg/dL — AB (ref 65–99)
GLUCOSE-CAPILLARY: 120 mg/dL — AB (ref 65–99)
Glucose-Capillary: 116 mg/dL — ABNORMAL HIGH (ref 65–99)
Glucose-Capillary: 117 mg/dL — ABNORMAL HIGH (ref 65–99)
Glucose-Capillary: 120 mg/dL — ABNORMAL HIGH (ref 65–99)

## 2016-01-30 LAB — BASIC METABOLIC PANEL
Anion gap: 7 (ref 5–15)
BUN: 15 mg/dL (ref 6–20)
CALCIUM: 8.4 mg/dL — AB (ref 8.9–10.3)
CHLORIDE: 105 mmol/L (ref 101–111)
CO2: 24 mmol/L (ref 22–32)
CREATININE: 0.61 mg/dL (ref 0.44–1.00)
GFR calc Af Amer: >60 mL/min (ref >60)
GFR calc non Af Amer: >60 mL/min (ref >60)
GLUCOSE: 114 mg/dL — AB (ref 65–99)
Potassium: 3.5 mmol/L (ref 3.5–5.1)
Sodium: 136 mmol/L (ref 135–145)

## 2016-01-30 LAB — CBC WITH DIFFERENTIAL/PLATELET
BASOS PCT: 0 %
Basophils Absolute: 0 10*3/uL (ref 0.0–0.1)
EOS PCT: 1 %
Eosinophils Absolute: 0.1 10*3/uL (ref 0.0–0.7)
HEMATOCRIT: 23.5 % — AB (ref 36.0–46.0)
Hemoglobin: 7.8 g/dL — ABNORMAL LOW (ref 12.0–15.0)
Lymphocytes Relative: 11 %
Lymphs Abs: 1.5 10*3/uL (ref 0.7–4.0)
MCH: 30.1 pg (ref 26.0–34.0)
MCHC: 33.2 g/dL (ref 30.0–36.0)
MCV: 90.7 fL (ref 78.0–100.0)
MONOS PCT: 10 %
Monocytes Absolute: 1.3 10*3/uL — ABNORMAL HIGH (ref 0.1–1.0)
Neutro Abs: 10.3 10*3/uL — ABNORMAL HIGH (ref 1.7–7.7)
Neutrophils Relative %: 78 %
Platelets: 350 10*3/uL (ref 150–400)
RBC: 2.59 MIL/uL — AB (ref 3.87–5.11)
RDW: 17.4 % — ABNORMAL HIGH (ref 11.5–15.5)
WBC: 13.2 10*3/uL — AB (ref 4.0–10.5)

## 2016-01-30 LAB — MAGNESIUM: Magnesium: 2.1 mg/dL (ref 1.7–2.4)

## 2016-01-30 MED ORDER — POTASSIUM CHLORIDE CRYS ER 20 MEQ PO TBCR
20.0000 meq | EXTENDED_RELEASE_TABLET | Freq: Every day | ORAL | Status: DC
  Administered 2016-01-31 – 2016-02-01 (×2): 20 meq via ORAL
  Filled 2016-01-30 (×2): qty 1

## 2016-01-30 MED ORDER — LIDOCAINE HCL 1 % IJ SOLN
INTRAMUSCULAR | Status: AC
  Administered 2016-01-30: 10 mL
  Filled 2016-01-30: qty 20

## 2016-01-30 MED ORDER — IOPAMIDOL (ISOVUE-300) INJECTION 61%
INTRAVENOUS | Status: AC
  Administered 2016-01-30: 20 mL
  Filled 2016-01-30: qty 50

## 2016-01-30 MED ORDER — POTASSIUM CHLORIDE CRYS ER 20 MEQ PO TBCR
40.0000 meq | EXTENDED_RELEASE_TABLET | Freq: Once | ORAL | Status: AC
  Administered 2016-01-30: 40 meq via ORAL
  Filled 2016-01-30: qty 2

## 2016-01-30 MED ORDER — MIDAZOLAM HCL 2 MG/2ML IJ SOLN
INTRAMUSCULAR | Status: AC | PRN
  Administered 2016-01-30 (×2): 0.5 mg via INTRAVENOUS

## 2016-01-30 MED ORDER — MIDAZOLAM HCL 2 MG/2ML IJ SOLN
INTRAMUSCULAR | Status: AC
  Filled 2016-01-30: qty 2

## 2016-01-30 MED ORDER — FENTANYL CITRATE (PF) 100 MCG/2ML IJ SOLN
INTRAMUSCULAR | Status: AC
  Filled 2016-01-30: qty 2

## 2016-01-30 MED ORDER — FENTANYL CITRATE (PF) 100 MCG/2ML IJ SOLN
INTRAMUSCULAR | Status: AC | PRN
  Administered 2016-01-30 (×2): 25 ug via INTRAVENOUS

## 2016-01-30 NOTE — Procedures (Signed)
R JJ ureteral stent No comp/EBL

## 2016-01-30 NOTE — Progress Notes (Signed)
PROGRESS NOTE    KNOX HOLDMAN  KGM:[id number] DOB: 1941-02-15 DOA: 01/20/2016 PCP: Kirk Ruths, MD   Brief Narrative:  [name] is a 75 year old female with a past medical history of a total left knee arthroplasty seizure performed on 09/28/2015 by Dr. Sharol Given of orthopedic surgery, was complicated by infection undergoing removal of hardware and placement of antibiotic beads on 10/12/2015, procedure performed by Dr. Sharol Given. Unfortunately she had dehiscence of left knee arthroplasty undergoing irrigation and debridement on 10/18/2015. She was admitted to the pulmonary critical care service on 01/20/2016 presenting with abdominal pain, dysuria, nausea, vomiting, found have a temperature of 103, hypotensive, and required pressor support. She was further worked up with CT scan of abdomen and pelvis that revealed obstructing UVJ stone with associated hydroureteronephrosis. Urology was consulted, she underwent nephrostomy tube placement. Urine cultures and blood cultures both growing Proteus mirabilis, with repeat blood cultures performed on 01/23/2016 showing no growth to date. She is on ceftriaxone 2 g IV every 24 hours. During this hospitalization she was seen by her orthopedic surgeon Dr Sharol Given who recommended at least 4 weeks of antibiotic coverage. On 01/24/2016 her care was transferred from [facility] to medicine.    Assessment & Plan:   Principal Problem:   Septic shock ([place]) Active Problems:   Pressure ulcer   Elevated troponin   Abnormal echocardiogram   Hypoxia   Sepsis ([place])   Obstructive uropathy   Hypokalemia   Acute on chronic systolic heart failure (HCC)   Herpetic lesions   Hydronephrosis  1. Septic shock -Courtney. James is a 75 year old female she presented in shock likely secondary to urosepsis, requiring IV pressor support and ICU admission. -Source of infection likely pyelonephritis/UTI, as CT scan revealed evidence of obstructive uropathy. Radiology reported obstructing 2 mm  right UVJ stone with associated hydronephrosis. -Both urine and blood cultures growing Proteus -She is now hemodynamically stable off of pressor support and has been transferred to the medicine service. -Susceptibility testing showing organism is sensitive to beta lactams. WBC trending down and currently at 13.5 from 14.0 from 20.3 from 57.1. -Discontinued ceftriaxone and started with ampicillin -Repeat blood cultures drawn on 01/23/2016 show no growth after overnight incubation. Patient has been transitioned to oral amoxicillin.   2. Obstructive uropathy. -She was admitted for urosepsis, with CT scan showing obstructing stone and right UVJ that was associated with hydronephrosis. -She underwent urostomy tube placement, which appears to be draining well. -Foley catheter remains in place -Urology was consulted during this hospitalization.  -Treating UTI with ampicillin IV. Changed IV ampicillin to oral amoxicillin.  Discussed obstructive uropathy and nephrostomy tubes with urology were recommending internalized in of [place] stents. IR has been contacted and this will be done on today, 01/30/2016. Will likely need to follow-up with urology and interventional radiology as outpatient.  3. History of left total knee arthroplasty -Complicated by infection -During this hospitalization she was seen by plastics and orthopedic surgery -Source of sepsis likely to be urinary -Dr. Sharol Given recommending one month of antibiotic therapy  4. Hypokalemia/hypomagnesemia -A.m. lab work showing potassium of 2.3 on 01/25/2016, she had been on Lasix therapy. -Replaced with by mouth and IV potassium. Repeat labs with potassium of 3.5. Monitor closely with diuresis. Replete magnesium. Will place on kdur 20 mEq daily.  5. History of hypertension. -Continue Coreg 3.125 mg by mouth twice a day.  5. Acute systolic congestive heart failure -Transthoracic echocardiogram showing EF of 25-30% -She is clinically  euvolemic, had been on Lasix 40 mg  IV twice a day -Replacing potassium -Cardiology following recommending repeating 2-D echo done on 01/26/2016 with a EF of 35-40% with inferior, distal, apical and mid to distal septal hypokinesis. EF with some improvement from 01/22/2016 when EF was 25-30%. Patient has been transitioned to oral Lasix 20 mg daily. Continue Coreg. Per Cardiology will hold off on starting the ACE inhibitor due to patient's multiple comorbidities and increased risk of acute kidney injury.   6. Probable demand ischemia/Elevated troponin -Evaluated by cardiology, lab work revealed peak troponin of 1.53 -Cardiology recommended a repeat echo. Repeat 2-D echo was done 01/26/2016 which are EF of 35-40% with inferior, distal apical, apical and mid to distal septal hypokinesis. Ejection fraction improved from prior ejection fraction of 25-30%. This felt per cardiology that patient likely had a stress-induced cardiomyopathy. It was felt patient would not benefit from the addition of ACE inhibitor due to her multiple comorbidities and increased risk of acute kidney injury. IV heparin been discontinued. Continue medical management. Patient likely need repeat 2-D echo done approximately 3 months. Outpatient follow-up with cardiology.  7. Herpetic lesion oral mucosa Improving after starting patient on Valtrex 1 g 3 times a day. Follow.    DVT prophylaxis: SCDs Code Status: Full Family Communication: Updated patient. No family at bedside.  Disposition Plan: Likely skilled nursing facility hopefully early next week after and JJ stent has been internalized.    Consultants:   Urology: Dr. Diona Fanti 01/20/2016  Interventional radiology Dr. Kathlene Cote 01/20/2016  Cardiology: Dr. Burt Knack 01/23/2016  Orthopedics: Dr. Sharol Given 01/24/2016  Plastic surgery  Procedures:   CT abdomen and pelvis 01/20/2016  Chest x-ray 01/20/2016, 01/21/2016, 01/22/2016  2-D echo 01/22/2016, 01/26/2016  Right  percutaneous nephrostomy tube placement Dr. Kathlene Cote 01/20/2016  Antimicrobials:   IV Fortaz 01/20/2016>>>> 01/23/2016  IV Rocephin 01/23/2016>>>> 01/24/2016  IV vancomycin 01/20/2016>>>> 01/22/2016  IV ampicillin 01/24/2016>>>>> 01/26/2016  Oral amoxicillin 01/26/2016   Subjective: Patient sleeping easily arousable. No chest pain. No shortness of breath. Feeling better. Patient noted to have low-grade temp of 100 this morning.  Objective: Filed Vitals:   01/29/16 2121 01/30/16 0535 01/30/16 0643 01/30/16 0934  BP: 136/61 132/78 153/57 122/58  Pulse: 95 98 97 97  Temp: 98.3 F (36.8 C) 100 F (37.8 C)    TempSrc:  Axillary    Resp: 16 16    Height:      Weight:  53.3 kg (117 lb 8.1 oz)    SpO2: 93% 92%      Intake/Output Summary (Last 24 hours) at 01/30/16 1015 Last data filed at 01/30/16 0549  Gross per 24 hour  Intake     75 ml  Output    650 ml  Net   -575 ml   Filed Weights   01/28/16 0500 01/29/16 0506 01/30/16 0535  Weight: 52.8 kg (116 lb 6.5 oz) 52.5 kg (115 lb 11.9 oz) 53.3 kg (117 lb 8.1 oz)    Examination:  General exam: Appears calm and comfortable. Herpetic lesions on the oral mucosa improving. Respiratory system: CTAB anterior lung fields. Respiratory effort normal. Cardiovascular system: S1 & S2 heard, RRR. No JVD, murmurs, rubs, gallops or clicks. No pedal edema. Gastrointestinal system: Abdomen is nondistended, soft and nontender. No organomegaly or masses felt. Normal bowel sounds heard. Central nervous system: Alert and oriented. No focal neurological deficits. Extremities: Left lower extremity with bandage and in immobilizer. No bilateral lower extremity edema. Skin: Herpetic lesions on oral mucosa improving. Psychiatry: Judgement and insight appear normal. Mood & affect appropriate.  Data Reviewed: I have personally reviewed following labs and imaging studies  CBC:  Recent Labs Lab 01/24/16 0342 01/25/16 0540 01/27/16 0531  01/28/16 0545  WBC 34.8* 20.3* 14.0* 13.5*  NEUTROABS 32.4*  --   --   --   HGB 8.2* 8.5* 8.9* 8.2*  HCT 25.5* 26.0* 29.0* 27.5*  MCV 82.0 87.5 86.6 86.5  PLT 77* 80* 147* 371   Basic Metabolic Panel:  Recent Labs Lab 01/26/16 0759 01/27/16 0531 01/28/16 0545 01/29/16 0404 01/30/16 0524  NA 140 137 138 137 136  K 3.3* 3.7 3.7 3.7 3.5  CL 107 106 106 104 105  CO2 '26 25 25 24 24  '$ GLUCOSE 91 111* 97 109* 114*  BUN '10 10 11 12 15  '$ CREATININE 0.59 0.64 0.63 0.67 0.61  CALCIUM 8.0* 8.4* 8.4* 8.5* 8.4*  MG  --   --   --  1.6* 2.1   GFR: Estimated Creatinine Clearance: 48.8 mL/min (by C-G formula based on Cr of 0.61). Liver Function Tests:  Recent Labs Lab 01/24/16 0342  AST 164*  ALT 472*  ALKPHOS 212*  BILITOT 0.7  PROT 4.9*  ALBUMIN 1.7*   No results for input(s): LIPASE, AMYLASE in the last 168 hours. No results for input(s): AMMONIA in the last 168 hours. Coagulation Profile: No results for input(s): INR, PROTIME in the last 168 hours. Cardiac Enzymes: No results for input(s): CKTOTAL, CKMB, CKMBINDEX, TROPONINI in the last 168 hours. BNP (last 3 results) No results for input(s): PROBNP in the last 8760 hours. HbA1C: No results for input(s): HGBA1C in the last 72 hours. CBG:  Recent Labs Lab 01/29/16 1746 01/29/16 2024 01/30/16 0019 01/30/16 0402 01/30/16 0736  GLUCAP 234* 166* 116* 134* 125*   Lipid Profile: No results for input(s): CHOL, HDL, LDLCALC, TRIG, CHOLHDL, LDLDIRECT in the last 72 hours. Thyroid Function Tests: No results for input(s): TSH, T4TOTAL, FREET4, T3FREE, THYROIDAB in the last 72 hours. Anemia Panel: No results for input(s): VITAMINB12, FOLATE, FERRITIN, TIBC, IRON, RETICCTPCT in the last 72 hours. Sepsis Labs: No results for input(s): PROCALCITON, LATICACIDVEN in the last 168 hours.  Recent Results (from the past 240 hour(s))  Blood Culture (routine x 2)     Status: Abnormal   Collection Time: 01/20/16  4:30 PM  Result  Value Ref Range Status   Specimen Description BLOOD RIGHT HAND  Final   Special Requests BOTTLES DRAWN AEROBIC ONLY 5CC  Final   Culture  Setup Time   Final    GRAM NEGATIVE RODS AEROBIC BOTTLE ONLY CRITICAL RESULT CALLED TO, READ BACK BY AND VERIFIED WITH: K COMBS,PHARMD AT 0626 01/21/16 BY L BENFIELD    Culture (A)  Final    PROTEUS MIRABILIS SUSCEPTIBILITIES PERFORMED ON PREVIOUS CULTURE WITHIN THE LAST 5 DAYS.    Report Status 01/23/2016 FINAL  Final  Blood Culture (routine x 2)     Status: Abnormal   Collection Time: 01/20/16  4:40 PM  Result Value Ref Range Status   Specimen Description BLOOD LEFT ANTECUBITAL  Final   Special Requests BOTTLES DRAWN AEROBIC AND ANAEROBIC 5CC  Final   Culture  Setup Time   Final    GRAM NEGATIVE RODS IN BOTH AEROBIC AND ANAEROBIC BOTTLES CRITICAL RESULT CALLED TO, READ BACK BY AND VERIFIED WITH: K COMBS,PHARMD AT 0743 01/21/16 BY L BENFIELD    Culture PROTEUS MIRABILIS (A)  Final   Report Status 01/23/2016 FINAL  Final   Organism ID, Bacteria PROTEUS MIRABILIS  Final  Susceptibility   Proteus mirabilis - MIC*    AMPICILLIN <=2 SENSITIVE Sensitive     CEFAZOLIN <=4 SENSITIVE Sensitive     CEFEPIME <=1 SENSITIVE Sensitive     CEFTAZIDIME <=1 SENSITIVE Sensitive     CEFTRIAXONE <=1 SENSITIVE Sensitive     CIPROFLOXACIN <=0.25 SENSITIVE Sensitive     GENTAMICIN <=1 SENSITIVE Sensitive     IMIPENEM 2 SENSITIVE Sensitive     TRIMETH/SULFA <=20 SENSITIVE Sensitive     AMPICILLIN/SULBACTAM <=2 SENSITIVE Sensitive     PIP/TAZO <=4 SENSITIVE Sensitive     * PROTEUS MIRABILIS  Blood Culture ID Panel (Reflexed)     Status: Abnormal   Collection Time: 01/20/16  4:40 PM  Result Value Ref Range Status   Enterococcus species NOT DETECTED NOT DETECTED Final   Vancomycin resistance NOT DETECTED NOT DETECTED Final   Listeria monocytogenes NOT DETECTED NOT DETECTED Final   Staphylococcus species NOT DETECTED NOT DETECTED Final   Staphylococcus aureus  NOT DETECTED NOT DETECTED Final   Methicillin resistance NOT DETECTED NOT DETECTED Final   Streptococcus species NOT DETECTED NOT DETECTED Final   Streptococcus agalactiae NOT DETECTED NOT DETECTED Final   Streptococcus pneumoniae NOT DETECTED NOT DETECTED Final   Streptococcus pyogenes NOT DETECTED NOT DETECTED Final   Acinetobacter baumannii NOT DETECTED NOT DETECTED Final   Enterobacteriaceae species DETECTED (A) NOT DETECTED Final    Comment: CRITICAL RESULT CALLED TO, READ BACK BY AND VERIFIED WITH: K COMBS,PHARMD AT 0743 01/21/16 BY L BENFIELD    Enterobacter cloacae complex NOT DETECTED NOT DETECTED Final   Escherichia coli NOT DETECTED NOT DETECTED Final   Klebsiella oxytoca NOT DETECTED NOT DETECTED Final   Klebsiella pneumoniae NOT DETECTED NOT DETECTED Final   Proteus species DETECTED (A) NOT DETECTED Final    Comment: CRITICAL RESULT CALLED TO, READ BACK BY AND VERIFIED WITH: K COMBS,PHARMD AT 0743 01/21/16 BY L BENFIELD    Serratia marcescens NOT DETECTED NOT DETECTED Final   Carbapenem resistance NOT DETECTED NOT DETECTED Final   Haemophilus influenzae NOT DETECTED NOT DETECTED Final   Neisseria meningitidis NOT DETECTED NOT DETECTED Final   Pseudomonas aeruginosa NOT DETECTED NOT DETECTED Final   Candida albicans NOT DETECTED NOT DETECTED Final   Candida glabrata NOT DETECTED NOT DETECTED Final   Candida krusei NOT DETECTED NOT DETECTED Final   Candida parapsilosis NOT DETECTED NOT DETECTED Final   Candida tropicalis NOT DETECTED NOT DETECTED Final  Urine culture     Status: Abnormal   Collection Time: 01/20/16  6:32 PM  Result Value Ref Range Status   Specimen Description URINE, CATHETERIZED  Final   Special Requests NONE  Final   Culture 7,000 COLONIES/mL INSIGNIFICANT GROWTH (A)  Final   Report Status 01/22/2016 FINAL  Final  Urine culture     Status: Abnormal   Collection Time: 01/20/16 10:06 PM  Result Value Ref Range Status   Specimen Description URINE,  CATHETERIZED  Final   Special Requests RIGHT KIDNEY  Final   Culture 10,000 COLONIES/mL PROTEUS MIRABILIS (A)  Final   Report Status 01/23/2016 FINAL  Final   Organism ID, Bacteria PROTEUS MIRABILIS (A)  Final      Susceptibility   Proteus mirabilis - MIC*    AMPICILLIN <=2 SENSITIVE Sensitive     CEFAZOLIN <=4 SENSITIVE Sensitive     CEFTRIAXONE <=1 SENSITIVE Sensitive     CIPROFLOXACIN <=0.25 SENSITIVE Sensitive     GENTAMICIN <=1 SENSITIVE Sensitive     IMIPENEM 1 SENSITIVE Sensitive  NITROFURANTOIN 128 RESISTANT Resistant     TRIMETH/SULFA <=20 SENSITIVE Sensitive     AMPICILLIN/SULBACTAM <=2 SENSITIVE Sensitive     PIP/TAZO <=4 SENSITIVE Sensitive     * 10,000 COLONIES/mL PROTEUS MIRABILIS  MRSA PCR Screening     Status: None   Collection Time: 01/21/16  1:00 AM  Result Value Ref Range Status   MRSA by PCR NEGATIVE NEGATIVE Final    Comment:        The GeneXpert MRSA Assay (FDA approved for NASAL specimens only), is one component of a comprehensive MRSA colonization surveillance program. It is not intended to diagnose MRSA infection nor to guide or monitor treatment for MRSA infections.   Culture, blood (Routine X 2) w Reflex to ID Panel     Status: None   Collection Time: 01/23/16 11:10 AM  Result Value Ref Range Status   Specimen Description BLOOD LEFT HAND  Final   Special Requests IN PEDIATRIC BOTTLE 1 CC  Final   Culture NO GROWTH 5 DAYS  Final   Report Status 01/28/2016 FINAL  Final  Culture, blood (Routine X 2) w Reflex to ID Panel     Status: None   Collection Time: 01/23/16 11:11 AM  Result Value Ref Range Status   Specimen Description BLOOD RIGHT HAND  Final   Special Requests IN PEDIATRIC BOTTLE 3 CC  Final   Culture NO GROWTH 5 DAYS  Final   Report Status 01/28/2016 FINAL  Final         Radiology Studies: Dg Chest 2 View  01/30/2016  CLINICAL DATA:  Fever and weakness. EXAM: CHEST  2 VIEW COMPARISON:  01/22/2016 . FINDINGS: Mediastinum hilar  structures normal. Heart size normal. Persistent bowel from interstitial infiltrates present. Small bilateral pleural effusion . No pneumothorax. No acute bony abnormality. IMPRESSION: Interim removal of right IJ line. Persistent bilateral from interstitial infiltrates. Small bilateral pleural effusions . Electronically Signed   By: Marcello Moores  Register   On: 01/30/2016 09:08        Scheduled Meds: . amoxicillin  500 mg Oral Q8H  . antiseptic oral rinse  7 mL Mouth Rinse q12n4p  . carvedilol  3.125 mg Oral BID WC  . chlorhexidine  15 mL Mouth Rinse BID  . [START ON 01/31/2016] enoxaparin (LOVENOX) injection  40 mg Subcutaneous Q24H  . feeding supplement (ENSURE ENLIVE)  237 mL Oral TID BM  . furosemide  20 mg Oral Daily  . insulin aspart  0-9 Units Subcutaneous Q4H  . magnesium oxide  400 mg Oral BID  . multivitamin with minerals  1 tablet Oral Daily  . valACYclovir  1,000 mg Oral TID   Continuous Infusions:     LOS: 10 days    Time spent: 98 minutes    THOMPSON,DANIEL, MD [facility] Pager [phone]  If 7PM-7AM, please contact night-coverage [email or web address] Password [email or web address] 01/30/2016, 10:15 AM

## 2016-01-30 NOTE — Progress Notes (Signed)
Physical Therapy Treatment Patient Details Name: ARVIE VILLARRUEL MRN: [id number] DOB: 04-10-1941 Today's Date: 01/30/2016    History of Present Illness pt is a 75 y/o female with pmh of CAD, HTN, CVD, PVD, MRSA, THR's R, TKA L with infection, I and D with poly exchange, skin grafting to L knee, admitted with 2 wk h/o crampy abdominal pain, frequency, urgency, dysuria and onset of n/v day of admit.  Working dx's--septic shock, ARF and obstructing uropathy, s/p right percutaneous nephostomy.    PT Comments    Progressing slow.  Lethargic, stiff and painful L immobilized knee and newly painful R knee limiting mobility.   Follow Up Recommendations  SNF     Equipment Recommendations  Other (comment) (TBA at SNF)    Recommendations for Other Services       Precautions / Restrictions Precautions Precautions: Fall Required Braces or Orthoses: Knee Immobilizer - Left Knee Immobilizer - Left: On at all times Restrictions Weight Bearing Restrictions: Yes LLE Weight Bearing: Weight bearing as tolerated    Mobility  Bed Mobility Overal bed mobility: Needs Assistance Bed Mobility: Supine to Sit;Sit to Supine     Supine to sit: Total assist Sit to supine: Max assist      Transfers Overall transfer level: Needs assistance Equipment used: Rolling walker (2 wheeled);1 person hand held assist Transfers: Sit to/from [facility] Sit to Stand: Max assist;Total assist;+2 physical assistance Stand pivot transfers: Total assist;+2 physical assistance       General transfer comment: Due to pain in the R knee, pt unable to assist as well as in past treatments  Ambulation/Gait             General Gait Details: unable today   Stairs            Wheelchair Mobility    Modified Rankin (Stroke Patients Only)       Balance Overall balance assessment: Needs assistance Sitting-balance support: Single extremity supported;No upper extremity supported Sitting  balance-Leahy Scale: Poor Sitting balance - Comments: listing R     Standing balance-Leahy Scale: Zero Standing balance comment: stood from bed and BSC with pt unable to assist                    Cognition Arousal/Alertness: Awake/alert Behavior During Therapy: WFL for tasks assessed/performed Overall Cognitive Status: Impaired/Different from baseline Area of Impairment: Problem solving;Following commands       Following Commands: Follows one step commands with increased time     Problem Solving: Slow processing;Decreased initiation;Requires verbal cues;Requires tactile cues      Exercises General Exercises - Lower Extremity Heel Slides: AAROM;Right;10 reps;Supine    General Comments General comments (skin integrity, edema, etc.): Still have not heard from plastics wheth it is okay to begin AROM L Knee.      Pertinent Vitals/Pain Pain Assessment: Faces Faces Pain Scale: Hurts even more Pain Location: R knee Pain Descriptors / Indicators: Grimacing;Moaning Pain Intervention(s): Monitored during session;Repositioned    Home Living                      Prior Function            PT Goals (current goals can now be found in the care plan section) Acute Rehab PT Goals Patient Stated Goal: To get more therapy and ultimately get home. PT Goal Formulation: With family Time For Goal Achievement: 02/07/16 Potential to Achieve Goals: Good Progress towards PT goals: Progressing toward goals  Frequency  Min 3X/week    PT Plan Current plan remains appropriate    Co-evaluation             End of Session   Activity Tolerance: Patient limited by pain Patient left: in bed;with call bell/phone within reach;with family/visitor present     Time: 1130-1200 PT Time Calculation (min) (ACUTE ONLY): 30 min  Charges:  $Therapeutic Activity: 23-37 mins                    G Codes:      Sorina Derrig, Tessie Fass 01/30/2016, 12:14 PM 01/30/2016  Donnella Sham, PT [phone]  (pager)

## 2016-01-30 NOTE — Care Management Important Message (Signed)
Important Message  Patient Details  Name: [name] MRN: [id number] Date of Birth: Feb 19, 1941   Medicare Important Message Given:  Yes    [name] 01/30/2016, 11:48 AM

## 2016-01-31 LAB — CBC WITH DIFFERENTIAL/PLATELET
Basophils Absolute: 0 10*3/uL (ref 0.0–0.1)
Basophils Relative: 0 %
EOS ABS: 0.1 10*3/uL (ref 0.0–0.7)
Eosinophils Relative: 1 %
HEMATOCRIT: 23.8 % — AB (ref 36.0–46.0)
HEMOGLOBIN: 7.5 g/dL — AB (ref 12.0–15.0)
LYMPHS ABS: 1.4 10*3/uL (ref 0.7–4.0)
Lymphocytes Relative: 11 %
MCH: 28.6 pg (ref 26.0–34.0)
MCHC: 31.5 g/dL (ref 30.0–36.0)
MCV: 90.8 fL (ref 78.0–100.0)
MONOS PCT: 11 %
Monocytes Absolute: 1.5 10*3/uL — ABNORMAL HIGH (ref 0.1–1.0)
NEUTROS PCT: 77 %
Neutro Abs: 9.8 10*3/uL — ABNORMAL HIGH (ref 1.7–7.7)
Platelets: 443 10*3/uL — ABNORMAL HIGH (ref 150–400)
RBC: 2.62 MIL/uL — ABNORMAL LOW (ref 3.87–5.11)
RDW: 18 % — ABNORMAL HIGH (ref 11.5–15.5)
WBC: 12.8 10*3/uL — ABNORMAL HIGH (ref 4.0–10.5)

## 2016-01-31 LAB — BASIC METABOLIC PANEL
Anion gap: 8 (ref 5–15)
BUN: 13 mg/dL (ref 6–20)
CHLORIDE: 106 mmol/L (ref 101–111)
CO2: 23 mmol/L (ref 22–32)
CREATININE: 0.61 mg/dL (ref 0.44–1.00)
Calcium: 8.7 mg/dL — ABNORMAL LOW (ref 8.9–10.3)
GFR calc Af Amer: >60 mL/min (ref >60)
GFR calc non Af Amer: >60 mL/min (ref >60)
GLUCOSE: 105 mg/dL — AB (ref 65–99)
Potassium: 4.4 mmol/L (ref 3.5–5.1)
SODIUM: 137 mmol/L (ref 135–145)

## 2016-01-31 LAB — MAGNESIUM: MAGNESIUM: 1.8 mg/dL (ref 1.7–2.4)

## 2016-01-31 LAB — GLUCOSE, CAPILLARY
GLUCOSE-CAPILLARY: 106 mg/dL — AB (ref 65–99)
GLUCOSE-CAPILLARY: 118 mg/dL — AB (ref 65–99)
GLUCOSE-CAPILLARY: 139 mg/dL — AB (ref 65–99)
GLUCOSE-CAPILLARY: 177 mg/dL — AB (ref 65–99)
GLUCOSE-CAPILLARY: 89 mg/dL (ref 65–99)
GLUCOSE-CAPILLARY: 95 mg/dL (ref 65–99)
Glucose-Capillary: 109 mg/dL — ABNORMAL HIGH (ref 65–99)

## 2016-01-31 LAB — HEMOGLOBIN AND HEMATOCRIT, BLOOD
HCT: 35.6 % — ABNORMAL LOW (ref 36.0–46.0)
HEMOGLOBIN: 11.7 g/dL — AB (ref 12.0–15.0)

## 2016-01-31 LAB — PREPARE RBC (CROSSMATCH)

## 2016-01-31 LAB — LACTIC ACID, PLASMA: LACTIC ACID, VENOUS: 0.9 mmol/L (ref 0.5–1.9)

## 2016-01-31 MED ORDER — DIPHENHYDRAMINE HCL 25 MG PO CAPS
25.0000 mg | ORAL_CAPSULE | Freq: Once | ORAL | Status: AC
Start: 2016-01-31 — End: 2016-01-31
  Administered 2016-01-31: 25 mg via ORAL
  Filled 2016-01-31: qty 1

## 2016-01-31 MED ORDER — FUROSEMIDE 10 MG/ML IJ SOLN
20.0000 mg | Freq: Once | INTRAMUSCULAR | Status: AC
  Administered 2016-01-31: 20 mg via INTRAVENOUS
  Filled 2016-01-31: qty 2

## 2016-01-31 MED ORDER — SODIUM CHLORIDE 0.9 % IV SOLN
Freq: Once | INTRAVENOUS | Status: AC
  Administered 2016-01-31: 10:00:00 via INTRAVENOUS

## 2016-01-31 MED ORDER — ACETAMINOPHEN 325 MG PO TABS
650.0000 mg | ORAL_TABLET | Freq: Once | ORAL | Status: AC
  Administered 2016-01-31: 650 mg via ORAL
  Filled 2016-01-31: qty 2

## 2016-01-31 NOTE — Progress Notes (Signed)
PROGRESS NOTE    [name]  ATF:[id number] DOB: 05/20/1941 DOA: 01/20/2016 PCP: Courtney Ruths, MD   Brief Narrative:  [name] is a 75 year old female with a past medical history of a total left knee arthroplasty seizure performed on 09/28/2015 by Dr. Sharol Given of orthopedic surgery, was complicated by infection undergoing removal of hardware and placement of antibiotic beads on 10/12/2015, procedure performed by Dr. Sharol Given. Unfortunately she had dehiscence of left knee arthroplasty undergoing irrigation and debridement on 10/18/2015. She was admitted to the pulmonary critical care service on 01/20/2016 presenting with abdominal pain, dysuria, nausea, vomiting, found have a temperature of 103, hypotensive, and required pressor support. She was further worked up with CT scan of abdomen and pelvis that revealed obstructing UVJ stone with associated hydroureteronephrosis. Urology was consulted, she underwent nephrostomy tube placement. Urine cultures and blood cultures both growing Proteus mirabilis, with repeat blood cultures performed on 01/23/2016 showing no growth to date. She is on ceftriaxone 2 g IV every 24 hours. During this hospitalization she was seen by her orthopedic surgeon Dr Sharol Given who recommended at least 4 weeks of antibiotic coverage. On 01/24/2016 her care was transferred from [facility] to medicine.    Assessment & Plan:   Principal Problem:   Septic shock ([place]) Active Problems:   Pressure ulcer   Elevated troponin   Abnormal echocardiogram   Hypoxia   Sepsis ([place])   Obstructive uropathy   Hypokalemia   Acute on chronic systolic heart failure (HCC)   Herpetic lesions   Hydronephrosis  1. Septic shock -[name] is a 75 year old female she presented in shock likely secondary to urosepsis, requiring IV pressor support and ICU admission. -Source of infection likely pyelonephritis/UTI, as CT scan revealed evidence of obstructive uropathy. Radiology reported obstructing 2 mm  right UVJ stone with associated hydronephrosis. -Both urine and blood cultures growing Proteus -She is now hemodynamically stable off of pressor support and has been transferred to the medicine service. -Susceptibility testing showing organism is sensitive to beta lactams. WBC trending down and currently at 12.8 from 13.5 from 14.0 from 20.3 from 57.1. -Discontinued ceftriaxone and started with ampicillin -Repeat blood cultures drawn on 01/23/2016 show no growth after overnight incubation. Patient has been transitioned to oral amoxicillin.   2. Obstructive uropathy. -She was admitted for urosepsis, with CT scan showing obstructing stone and right UVJ that was associated with hydronephrosis. -She underwent urostomy tube placement, which appears to be draining well. -Foley catheter remains in place -Urology was consulted during this hospitalization.  -Treating UTI with ampicillin IV. Changed IV ampicillin to oral amoxicillin.  Discussed obstructive uropathy and nephrostomy tubes with urology were recommending internalized in of [place] stents. IR has been contacted and patient underwent internalization of JJ stents, 01/30/2016. Will likely need to follow-up with urology and interventional radiology as outpatient.  3. History of left total knee arthroplasty -Complicated by infection -During this hospitalization she was seen by plastics and orthopedic surgery -Source of sepsis likely to be urinary -Dr. Sharol Given recommending one month of antibiotic therapy  4. Hypokalemia/hypomagnesemia -A.m. lab work showing potassium of 2.3 on 01/25/2016, she had been on Lasix therapy. -Replaced with by mouth and IV potassium. Repeat labs with potassium of 4.4. Monitor closely with diuresis. Replete magnesium. Continue kdur 20 mEq daily.  5. History of hypertension. -Continue Coreg 3.125 mg by mouth twice a day.  5. Acute systolic congestive heart failure -Transthoracic echocardiogram showing EF of  25-30% -She is clinically euvolemic, had been on Lasix 40 mg  IV twice a day -Replacing potassium -Cardiology following recommending repeating 2-D echo done on 01/26/2016 with a EF of 35-40% with inferior, distal, apical and mid to distal septal hypokinesis. EF with some improvement from 01/22/2016 when EF was 25-30%. Patient has been transitioned to oral Lasix 20 mg daily. Continue Coreg. Per Cardiology will hold off on starting the ACE inhibitor due to patient's multiple comorbidities and increased risk of acute kidney injury.   6. Probable demand ischemia/Elevated troponin -Evaluated by cardiology, lab work revealed peak troponin of 1.53 -Cardiology recommended a repeat echo. Repeat 2-D echo was done 01/26/2016 which are EF of 35-40% with inferior, distal apical, apical and mid to distal septal hypokinesis. Ejection fraction improved from prior ejection fraction of 25-30%. This felt per cardiology that patient likely had a stress-induced cardiomyopathy. It was felt patient would not benefit from the addition of ACE inhibitor due to her multiple comorbidities and increased risk of acute kidney injury. IV heparin been discontinued. Continue medical management. Patient likely need repeat 2-D echo done approximately 3 months. Patient noted to have a hemoglobin of 7.5 this morning. Transfuse 2 units packed red blood cells. Outpatient follow-up with cardiology.  7. Herpetic lesion oral mucosa Improving after starting patient on Valtrex 1 g 3 times a day D4/7. Follow.  8. Anemia Patient with a hemoglobin of 7.5. Patient with no overt bleeding. Due to demand ischemia/elevated troponins during this hospitalization will transfuse 2 units of packed red blood cells to keep hemoglobin around 9-10.    DVT prophylaxis: SCDs Code Status: Full Family Communication: Updated patient. No family at bedside.  Disposition Plan: Likely skilled nursing facility tomorrow if hemoglobin remains stable and patient is  afebrile.    Consultants:   Urology: Dr. Diona Fanti 01/20/2016  Interventional radiology Dr. Kathlene Cote 01/20/2016  Cardiology: Dr. Burt Knack 01/23/2016  Orthopedics: Dr. Sharol Given 01/24/2016  Plastic surgery  Procedures:   CT abdomen and pelvis 01/20/2016  Chest x-ray 01/20/2016, 01/21/2016, 01/22/2016  2-D echo 01/22/2016, 01/26/2016  Right percutaneous nephrostomy tube placement Dr. Kathlene Cote 01/20/2016  Successful right double-J urethral stent placement for right urethral obstruction. Nephrostomy tube removed. Per Dr. Barbie Banner 01/30/2016  2 units packed red blood cells 01/31/2016  Antimicrobials:   IV Fortaz 01/20/2016>>>> 01/23/2016  IV Rocephin 01/23/2016>>>> 01/24/2016  IV vancomycin 01/20/2016>>>> 01/22/2016  IV ampicillin 01/24/2016>>>>> 01/26/2016  Oral amoxicillin 01/26/2016   Subjective: Patient sitting up in bed receiving blood. No chest pain. No shortness of breath. Feeling better.   Objective: Filed Vitals:   01/31/16 0944 01/31/16 1015 01/31/16 1245 01/31/16 1315  BP: 133/36 115/43 115/32 109/45  Pulse: 60 66 50 52  Temp: 98.4 F (36.9 C) 98.8 F (37.1 C) 98 F (36.7 C) 98 F (36.7 C)  TempSrc: Oral Oral Oral Oral  Resp: '19 24 20 20  '$ Height:      Weight:      SpO2: 97% 99% 98% 92%    Intake/Output Summary (Last 24 hours) at 01/31/16 1601 Last data filed at 01/31/16 1424  Gross per 24 hour  Intake    300 ml  Output      0 ml  Net    300 ml   Filed Weights   01/29/16 0506 01/30/16 0535 01/31/16 0548  Weight: 52.5 kg (115 lb 11.9 oz) 53.3 kg (117 lb 8.1 oz) 52.4 kg (115 lb 8.3 oz)    Examination:  General exam: Appears calm and comfortable. Herpetic lesions on the oral mucosa improved. Respiratory system: CTAB anterior lung fields. Respiratory effort normal.  Cardiovascular system: S1 & S2 heard, RRR. No JVD, murmurs, rubs, gallops or clicks. No pedal edema. Gastrointestinal system: Abdomen is nondistended, soft and nontender. No organomegaly  or masses felt. Normal bowel sounds heard. Central nervous system: Alert and oriented. No focal neurological deficits. Extremities: Left lower extremity with bandage and in immobilizer. No bilateral lower extremity edema. Skin: Herpetic lesions on oral mucosa improving. Psychiatry: Judgement and insight appear normal. Mood & affect appropriate.     Data Reviewed: I have personally reviewed following labs and imaging studies  CBC:  Recent Labs Lab 01/25/16 0540 01/27/16 0531 01/28/16 0545 01/30/16 0524 01/31/16 0521  WBC 20.3* 14.0* 13.5* 13.2* 12.8*  NEUTROABS  --   --   --  10.3* 9.8*  HGB 8.5* 8.9* 8.2* 7.8* 7.5*  HCT 26.0* 29.0* 27.5* 23.5* 23.8*  MCV 87.5 86.6 86.5 90.7 90.8  PLT 80* 147* 183 350 476*   Basic Metabolic Panel:  Recent Labs Lab 01/27/16 0531 01/28/16 0545 01/29/16 0404 01/30/16 0524 01/31/16 0521  NA 137 138 137 136 137  K 3.7 3.7 3.7 3.5 4.4  CL 106 106 104 105 106  CO2 '25 25 24 24 23  '$ GLUCOSE 111* 97 109* 114* 105*  BUN '10 11 12 15 13  '$ CREATININE 0.64 0.63 0.67 0.61 0.61  CALCIUM 8.4* 8.4* 8.5* 8.4* 8.7*  MG  --   --  1.6* 2.1 1.8   GFR: Estimated Creatinine Clearance: 48.8 mL/min (by C-G formula based on Cr of 0.61). Liver Function Tests: No results for input(s): AST, ALT, ALKPHOS, BILITOT, PROT, ALBUMIN in the last 168 hours. No results for input(s): LIPASE, AMYLASE in the last 168 hours. No results for input(s): AMMONIA in the last 168 hours. Coagulation Profile: No results for input(s): INR, PROTIME in the last 168 hours. Cardiac Enzymes: No results for input(s): CKTOTAL, CKMB, CKMBINDEX, TROPONINI in the last 168 hours. BNP (last 3 results) No results for input(s): PROBNP in the last 8760 hours. HbA1C: No results for input(s): HGBA1C in the last 72 hours. CBG:  Recent Labs Lab 01/30/16 2009 01/31/16 0041 01/31/16 0420 01/31/16 0733 01/31/16 1139  GLUCAP 120* 106* 95 109* 118*   Lipid Profile: No results for input(s):  CHOL, HDL, LDLCALC, TRIG, CHOLHDL, LDLDIRECT in the last 72 hours. Thyroid Function Tests: No results for input(s): TSH, T4TOTAL, FREET4, T3FREE, THYROIDAB in the last 72 hours. Anemia Panel: No results for input(s): VITAMINB12, FOLATE, FERRITIN, TIBC, IRON, RETICCTPCT in the last 72 hours. Sepsis Labs:  Recent Labs Lab 01/31/16 0521  LATICACIDVEN 0.9    Recent Results (from the past 240 hour(s))  Culture, blood (Routine X 2) w Reflex to ID Panel     Status: None   Collection Time: 01/23/16 11:10 AM  Result Value Ref Range Status   Specimen Description BLOOD LEFT HAND  Final   Special Requests IN PEDIATRIC BOTTLE 1 CC  Final   Culture NO GROWTH 5 DAYS  Final   Report Status 01/28/2016 FINAL  Final  Culture, blood (Routine X 2) w Reflex to ID Panel     Status: None   Collection Time: 01/23/16 11:11 AM  Result Value Ref Range Status   Specimen Description BLOOD RIGHT HAND  Final   Special Requests IN PEDIATRIC BOTTLE 3 CC  Final   Culture NO GROWTH 5 DAYS  Final   Report Status 01/28/2016 FINAL  Final         Radiology Studies: Dg Chest 2 View  01/30/2016  CLINICAL DATA:  Fever and weakness. EXAM: CHEST  2 VIEW COMPARISON:  01/22/2016 . FINDINGS: Mediastinum hilar structures normal. Heart size normal. Persistent bowel from interstitial infiltrates present. Small bilateral pleural effusion . No pneumothorax. No acute bony abnormality. IMPRESSION: Interim removal of right IJ line. Persistent bilateral from interstitial infiltrates. Small bilateral pleural effusions . Electronically Signed   By: Marcello Moores  Register   On: 01/30/2016 09:08   Ir Nephrostogram Right Thru Existing Access  01/30/2016  INDICATION: Right ureteral obstruction EXAM: IR NEPHROSTOGRAM EXISTING ACCESS RIGHT COMPARISON:  None. MEDICATIONS: The patient is on amoxicillin ANESTHESIA/SEDATION: Fentanyl 50 mcg IV; Versed 1 mg IV Moderate Sedation Time:  14 The patient was continuously monitored during the procedure by the  interventional radiology nurse under my direct supervision. CONTRAST:  10 cc Omnipaque 300 - administered into the collecting system(s) FLUOROSCOPY TIME:  Fluoroscopy Time: 3 minutes 12 seconds (7 mGy). COMPLICATIONS: None immediate. PROCEDURE: Informed written consent was obtained from the patient after a thorough discussion of the procedural risks, benefits and alternatives. All questions were addressed. Maximal Sterile Barrier Technique was utilized including caps, mask, sterile gowns, sterile gloves, sterile drape, hand hygiene and skin antiseptic. A timeout was performed prior to the initiation of the procedure. The right low back was prepped and draped in a sterile fashion. 1% lidocaine was utilized for local anesthesia. The nephrostomy was cut and exchanged over a Bentson wire for a Kumpe catheter. Contrast was injected for a nephrostogram. The Kumpe be catheter was advanced over a Bentson wire into the bladder then removed over an Amplatz. A 24 cm 8.5 French double-J ureteral stent was then advanced to the bladder. It was deployed with its tips coiled in the renal pelvis and bladder. The retention string was removed. Contrast was injected into the pusher and imaging was obtained. The pusher was removed. FINDINGS: Antegrade right nephrostogram demonstrates distal ureteral obstruction. Final image demonstrates a right double-J ureteral stent extending from the right renal pelvis to the bladder. IMPRESSION: Successful right double-J ureteral stent placement for right ureteral obstruction. The nephrostomy was removed. Electronically Signed   By: Marybelle Killings M.D.   On: 01/30/2016 15:04        Scheduled Meds: . amoxicillin  500 mg Oral Q8H  . antiseptic oral rinse  7 mL Mouth Rinse q12n4p  . carvedilol  3.125 mg Oral BID WC  . chlorhexidine  15 mL Mouth Rinse BID  . enoxaparin (LOVENOX) injection  40 mg Subcutaneous Q24H  . feeding supplement (ENSURE ENLIVE)  237 mL Oral TID BM  . furosemide  20 mg  Oral Daily  . insulin aspart  0-9 Units Subcutaneous Q4H  . magnesium oxide  400 mg Oral BID  . multivitamin with minerals  1 tablet Oral Daily  . potassium chloride  20 mEq Oral Daily  . valACYclovir  1,000 mg Oral TID   Continuous Infusions:     LOS: 11 days    Time spent: 75 minutes    [name] Osier, MD [facility] Pager [phone]  If 7PM-7AM, please contact night-coverage [email or web address] Password [email or web address] 01/31/2016, 4:01 PM

## 2016-01-31 NOTE — Progress Notes (Signed)
Pt receiving blood today and hemoglobin levels will need to be retested this evening- plan for DC tomorrow to SNF.  1st choice is [facility] but they have been full 2nd choice is [facility] who will have a bed available tomorrow  CSW will continue to follow  Domenica Reamer, [place] Social Worker [phone]

## 2016-01-31 NOTE — Progress Notes (Signed)
Physical Therapy Treatment Patient Details Name: [name] MRN: [id number] DOB: [birth year] Today's Date: 01/31/2016    History of Present Illness pt is a 75 y/o female with pmh of CAD, HTN, CVD, PVD, MRSA, THR's R, TKA L with infection, I and D with poly exchange, skin grafting to L knee, admitted with 2 wk h/o crampy abdominal pain, frequency, urgency, dysuria and onset of n/v day of admit.  Working dx's--septic shock, ARF and obstructing uropathy, s/p right percutaneous nephostomy.    PT Comments    Progressing slowly.  Await word from Plastics on clearance for ROM to L knee.  Follow Up Recommendations  SNF     Equipment Recommendations   (TBA`)    Recommendations for Other Services       Precautions / Restrictions Precautions Precautions: Fall Required Braces or Orthoses: Knee Immobilizer - Left Knee Immobilizer - Left: On at all times Restrictions Weight Bearing Restrictions: Yes LLE Weight Bearing: Weight bearing as tolerated    Mobility  Bed Mobility Overal bed mobility: Needs Assistance Bed Mobility: Supine to Sit;Sit to Supine     Supine to sit: Mod assist;HOB elevated Sit to supine: Mod assist   General bed mobility comments: Assist to move RLE off bed, to pivot and scoot hips to EOB with bed pad and for trunk support to come to sitting position. Pt able to do most of the steps with mod assist throughout. VCs to initiate and sequence through task.  Transfers Overall transfer level: Needs assistance Equipment used: Rolling walker (2 wheeled);1 person hand held assist Transfers: Sit to/from Stand Sit to Stand: Mod assist;+2 physical assistance Stand pivot transfers: Max assist;+2 physical assistance       General transfer comment: Mod +2 assist for sit-stand transfers for boost to stand, stabilize balance, and to take steps with RLE due to pain. Max +2 assist for initial stand-pivot transfer due to incontinence of bowel and RLE pain. However, 2nd  stand-pivot transfer attempt, pt able to complete with mod hand held assist. VCs for initiation and sequencing throughout.  Ambulation/Gait                 Stairs            Wheelchair Mobility    Modified Rankin (Stroke Patients Only)       Balance Overall balance assessment: Needs assistance Sitting-balance support: Feet supported;No upper extremity supported Sitting balance-Leahy Scale: Poor Sitting balance - Comments: posterior lean requiring min assist to maintain upright position initially. Able to progress to maintain sitting position with min guard assist Postural control: Posterior lean Standing balance support: Bilateral upper extremity supported Standing balance-Leahy Scale: Zero Standing balance comment: Reliant on UE support.  Stood at [facility] for 3-5 min for pericare, with face to face support.  Pt standing mostly on L LE due to pain in R knee with w/bearing.                    Cognition Arousal/Alertness: Awake/alert Behavior During Therapy: WFL for tasks assessed/performed Overall Cognitive Status: Impaired/Different from baseline Area of Impairment: Following commands;Problem solving;Memory     Memory: Decreased short-term memory Following Commands: Follows one step commands with increased time     Problem Solving: Slow processing;Decreased initiation;Requires verbal cues;Requires tactile cues General Comments: seems to be slowly clearing, but pt still mumbling incoherently at times.     Exercises General Exercises - Lower Extremity Heel Slides: AAROM;Right;10 reps;Supine    General Comments  Pertinent Vitals/Pain Pain Assessment: Faces Faces Pain Scale: Hurts even more Pain Location: R knee Pain Descriptors / Indicators: Grimacing;Moaning Pain Intervention(s): Monitored during session;Repositioned    Home Living                      Prior Function            PT Goals (current goals can now be found in the  care plan section) Acute Rehab PT Goals Patient Stated Goal: To get more therapy and ultimately get home. Time For Goal Achievement: 02/07/16 Potential to Achieve Goals: Good    Frequency  Min 3X/week    PT Plan Current plan remains appropriate    Co-evaluation PT/OT/SLP Co-Evaluation/Treatment: Yes Reason for Co-Treatment: Complexity of the patient's impairments (multi-system involvement);For patient/therapist safety PT goals addressed during session: Mobility/safety with mobility OT goals addressed during session: ADL's and self-care;Proper use of Adaptive equipment and DME;Strengthening/ROM     End of Session Equipment Utilized During Treatment: Left knee immobilizer Activity Tolerance: Patient limited by pain Patient left: in bed;with call bell/phone within reach;with bed alarm set;with nursing/sitter in room     Time: 1450-1539 PT Time Calculation (min) (ACUTE ONLY): 49 min  Charges:  $Therapeutic Activity: 8-22 mins                    G Codes:      Davinci Glotfelty, Tessie Fass 01/31/2016, 5:12 PM 01/31/2016  Donnella Sham, PT [phone]  (pager)

## 2016-01-31 NOTE — Progress Notes (Signed)
Occupational Therapy Treatment Patient Details Name: [name] MRN: [id number] DOB: September 11, 1940 Today's Date: 01/31/2016    History of present illness pt is a 75 y/o female with pmh of CAD, HTN, CVD, PVD, MRSA, THR's R, TKA L with infection, I and D with poly exchange, skin grafting to L knee, admitted with 2 wk h/o crampy abdominal pain, frequency, urgency, dysuria and onset of n/v day of admit.  Working dx's--septic shock, ARF and obstructing uropathy, s/p right percutaneous nephostomy.   OT comments  Pt continues to make slow, but steady progress. Pt able to tolerate 3-5 minutes of standing and completed x3 sit-stand transfers and x2 stand-pivot transfers with overall mod +2 assist. Pt continues to require max +2 assist for LB ADLs. POC and discharge disposition remain appropriate. Will continue to follow acutely.   Follow Up Recommendations  SNF;Supervision/Assistance - 24 hour    Equipment Recommendations  Other (comment) (TBD at next venue)    Recommendations for Other Services      Precautions / Restrictions Precautions Precautions: Fall Required Braces or Orthoses: Knee Immobilizer - Left Knee Immobilizer - Left: On at all times Restrictions Weight Bearing Restrictions: Yes LLE Weight Bearing: Weight bearing as tolerated       Mobility Bed Mobility Overal bed mobility: Needs Assistance Bed Mobility: Supine to Sit;Sit to Supine     Supine to sit: Mod assist;HOB elevated Sit to supine: Mod assist   General bed mobility comments: Assist to move RLE off bed, to pivot and scoot hips to EOB with bed pad and for trunk support to come to sitting position. Pt able to do most of the steps with mod assist throughout. VCs to initiate and sequence through task.  Transfers Overall transfer level: Needs assistance Equipment used: Rolling walker (2 wheeled);1 person hand held assist Transfers: Sit to/from [facility] Sit to Stand: Mod assist;+2 physical  assistance Stand pivot transfers: Max assist;+2 physical assistance       General transfer comment: Mod +2 assist for sit-stand transfers for boost to stand, stabilize balance, and to take steps with RLE due to pain. Max +2 assist for initial stand-pivot transfer due to incontinence of bowel and RLE pain. However, 2nd stand-pivot transfer attempt, pt able to complete with mod hand held assist. VCs for initiation and sequencing throughout.    Balance Overall balance assessment: Needs assistance Sitting-balance support: No upper extremity supported;Feet supported Sitting balance-Leahy Scale: Poor Sitting balance - Comments: posterior lean requiring min assist to maintain upright position initially. Able to progress to maintain sitting position with min guard assist Postural control: Posterior lean Standing balance support: Bilateral upper extremity supported;During functional activity Standing balance-Leahy Scale: Zero Standing balance comment: Completely reliant on UE support and +2 assist for balance                   ADL Overall ADL's : Needs assistance/impaired     Grooming: Wash/dry hands;Wash/dry face;Set up;Sitting   Upper Body Bathing: Moderate assistance;Sitting   Lower Body Bathing: Maximal assistance;+2 for physical assistance;Sit to/from stand   Upper Body Dressing : Moderate assistance;Sitting   Lower Body Dressing: Maximal assistance;+2 for physical assistance;Sit to/from stand   Toilet Transfer: Moderate assistance;+2 for physical assistance;Stand-pivot;BSC;RW;Cueing for safety   Toileting- Clothing Manipulation and Hygiene: Maximal assistance;+2 for physical assistance;Cueing for safety;Sit to/from stand       Functional mobility during ADLs: Moderate assistance;+2 for physical assistance;Rolling walker General ADL Comments: Pt able to tolerate 3-5 minutes of standing practice during  [facility] and weight-bearing primarily on LLE due to gout pain in R knee.       Vision                     Perception     Praxis      Cognition   Behavior During Therapy: WFL for tasks assessed/performed Overall Cognitive Status: Impaired/Different from baseline Area of Impairment: Following commands;Problem solving;Memory     Memory: Decreased short-term memory  Following Commands: Follows one step commands with increased time     Problem Solving: Slow processing;Decreased initiation;Requires verbal cues;Requires tactile cues General Comments: seems to be slowly clearing, but pt still mumbling incoherently at times.     Extremity/Trunk Assessment               Exercises     Shoulder Instructions       General Comments      Pertinent Vitals/ Pain       Pain Assessment: Faces Faces Pain Scale: Hurts even more Pain Location: R knee Pain Descriptors / Indicators: Aching;Grimacing;Guarding Pain Intervention(s): Limited activity within patient's tolerance;Monitored during session;Repositioned  Home Living                                          Prior Functioning/Environment              Frequency Min 2X/week     Progress Toward Goals  OT Goals(current goals can now be found in the care plan section)  Progress towards OT goals: Progressing toward goals  Acute Rehab OT Goals Patient Stated Goal: To get more therapy and ultimately get home. OT Goal Formulation: With patient Time For Goal Achievement: 02/09/16 Potential to Achieve Goals: Good ADL Goals Pt Will Perform Grooming: with min assist;standing Pt Will Perform Upper Body Bathing: with set-up;sitting Pt Will Perform Lower Body Bathing: with mod assist;sit to/from stand Pt Will Transfer to Toilet: with mod assist;ambulating;bedside commode Pt Will Perform Toileting - Clothing Manipulation and hygiene: with mod assist;sit to/from stand  Plan Discharge plan remains appropriate    Co-evaluation    PT/OT/SLP Co-Evaluation/Treatment:  Yes Reason for Co-Treatment: For patient/therapist safety   OT goals addressed during session: ADL's and self-care;Proper use of Adaptive equipment and DME;Strengthening/ROM      End of Session Equipment Utilized During Treatment: Gait belt;Rolling walker;Left knee immobilizer   Activity Tolerance Patient tolerated treatment well   Patient Left     Nurse Communication Mobility status;Other (comment) (Fecal matter on L knee immobilizer - will need to be cleaned)        Time: 1450-1539 OT Time Calculation (min): 49 min  Charges: OT General Charges $OT Visit: 1 Procedure OT Treatments $Self Care/Home Management : 23-37 mins  Redmond Baseman, OTR/L Pager: [phone] 01/31/2016, 4:28 PM

## 2016-02-01 DIAGNOSIS — K625 Hemorrhage of anus and rectum: Secondary | ICD-10-CM | POA: Diagnosis not present

## 2016-02-01 DIAGNOSIS — Z436 Encounter for attention to other artificial openings of urinary tract: Secondary | ICD-10-CM | POA: Diagnosis not present

## 2016-02-01 DIAGNOSIS — N390 Urinary tract infection, site not specified: Secondary | ICD-10-CM | POA: Diagnosis not present

## 2016-02-01 DIAGNOSIS — I4581 Long QT syndrome: Secondary | ICD-10-CM | POA: Diagnosis not present

## 2016-02-01 DIAGNOSIS — A419 Sepsis, unspecified organism: Secondary | ICD-10-CM | POA: Diagnosis not present

## 2016-02-01 DIAGNOSIS — N139 Obstructive and reflux uropathy, unspecified: Secondary | ICD-10-CM | POA: Diagnosis not present

## 2016-02-01 DIAGNOSIS — K648 Other hemorrhoids: Secondary | ICD-10-CM | POA: Diagnosis present

## 2016-02-01 DIAGNOSIS — I5023 Acute on chronic systolic (congestive) heart failure: Secondary | ICD-10-CM | POA: Diagnosis not present

## 2016-02-01 DIAGNOSIS — I10 Essential (primary) hypertension: Secondary | ICD-10-CM | POA: Diagnosis not present

## 2016-02-01 DIAGNOSIS — I959 Hypotension, unspecified: Secondary | ICD-10-CM | POA: Diagnosis not present

## 2016-02-01 DIAGNOSIS — R262 Difficulty in walking, not elsewhere classified: Secondary | ICD-10-CM | POA: Diagnosis not present

## 2016-02-01 DIAGNOSIS — K5730 Diverticulosis of large intestine without perforation or abscess without bleeding: Secondary | ICD-10-CM | POA: Diagnosis present

## 2016-02-01 DIAGNOSIS — R6521 Severe sepsis with septic shock: Secondary | ICD-10-CM | POA: Diagnosis not present

## 2016-02-01 DIAGNOSIS — I248 Other forms of acute ischemic heart disease: Secondary | ICD-10-CM | POA: Diagnosis not present

## 2016-02-01 DIAGNOSIS — K1379 Other lesions of oral mucosa: Secondary | ICD-10-CM | POA: Diagnosis not present

## 2016-02-01 DIAGNOSIS — D62 Acute posthemorrhagic anemia: Secondary | ICD-10-CM | POA: Diagnosis not present

## 2016-02-01 DIAGNOSIS — K449 Diaphragmatic hernia without obstruction or gangrene: Secondary | ICD-10-CM | POA: Diagnosis present

## 2016-02-01 DIAGNOSIS — E8809 Other disorders of plasma-protein metabolism, not elsewhere classified: Secondary | ICD-10-CM | POA: Diagnosis not present

## 2016-02-01 DIAGNOSIS — G9341 Metabolic encephalopathy: Secondary | ICD-10-CM | POA: Diagnosis not present

## 2016-02-01 DIAGNOSIS — J449 Chronic obstructive pulmonary disease, unspecified: Secondary | ICD-10-CM | POA: Diagnosis not present

## 2016-02-01 DIAGNOSIS — I5021 Acute systolic (congestive) heart failure: Secondary | ICD-10-CM | POA: Diagnosis not present

## 2016-02-01 DIAGNOSIS — K31819 Angiodysplasia of stomach and duodenum without bleeding: Secondary | ICD-10-CM | POA: Diagnosis present

## 2016-02-01 DIAGNOSIS — R34 Anuria and oliguria: Secondary | ICD-10-CM | POA: Diagnosis not present

## 2016-02-01 DIAGNOSIS — B964 Proteus (mirabilis) (morganii) as the cause of diseases classified elsewhere: Secondary | ICD-10-CM | POA: Diagnosis not present

## 2016-02-01 DIAGNOSIS — N132 Hydronephrosis with renal and ureteral calculous obstruction: Secondary | ICD-10-CM | POA: Diagnosis not present

## 2016-02-01 DIAGNOSIS — Z87891 Personal history of nicotine dependence: Secondary | ICD-10-CM | POA: Diagnosis not present

## 2016-02-01 DIAGNOSIS — N200 Calculus of kidney: Secondary | ICD-10-CM | POA: Diagnosis not present

## 2016-02-01 DIAGNOSIS — R6520 Severe sepsis without septic shock: Secondary | ICD-10-CM | POA: Diagnosis not present

## 2016-02-01 DIAGNOSIS — R231 Pallor: Secondary | ICD-10-CM | POA: Diagnosis not present

## 2016-02-01 DIAGNOSIS — R112 Nausea with vomiting, unspecified: Secondary | ICD-10-CM | POA: Diagnosis not present

## 2016-02-01 DIAGNOSIS — I509 Heart failure, unspecified: Secondary | ICD-10-CM | POA: Diagnosis not present

## 2016-02-01 DIAGNOSIS — K51311 Ulcerative (chronic) rectosigmoiditis with rectal bleeding: Secondary | ICD-10-CM | POA: Diagnosis present

## 2016-02-01 DIAGNOSIS — E876 Hypokalemia: Secondary | ICD-10-CM | POA: Diagnosis not present

## 2016-02-01 DIAGNOSIS — K922 Gastrointestinal hemorrhage, unspecified: Secondary | ICD-10-CM | POA: Diagnosis not present

## 2016-02-01 DIAGNOSIS — R0902 Hypoxemia: Secondary | ICD-10-CM | POA: Diagnosis not present

## 2016-02-01 DIAGNOSIS — B009 Herpesviral infection, unspecified: Secondary | ICD-10-CM

## 2016-02-01 DIAGNOSIS — N179 Acute kidney failure, unspecified: Secondary | ICD-10-CM | POA: Diagnosis not present

## 2016-02-01 DIAGNOSIS — R931 Abnormal findings on diagnostic imaging of heart and coronary circulation: Secondary | ICD-10-CM | POA: Diagnosis not present

## 2016-02-01 DIAGNOSIS — R7989 Other specified abnormal findings of blood chemistry: Secondary | ICD-10-CM | POA: Diagnosis not present

## 2016-02-01 DIAGNOSIS — Z471 Aftercare following joint replacement surgery: Secondary | ICD-10-CM | POA: Diagnosis not present

## 2016-02-01 DIAGNOSIS — Z66 Do not resuscitate: Secondary | ICD-10-CM | POA: Diagnosis present

## 2016-02-01 DIAGNOSIS — D649 Anemia, unspecified: Secondary | ICD-10-CM | POA: Diagnosis not present

## 2016-02-01 DIAGNOSIS — Z96652 Presence of left artificial knee joint: Secondary | ICD-10-CM | POA: Diagnosis present

## 2016-02-01 DIAGNOSIS — A4159 Other Gram-negative sepsis: Secondary | ICD-10-CM | POA: Diagnosis not present

## 2016-02-01 LAB — TYPE AND SCREEN
ABO/RH(D): O POS
Antibody Screen: NEGATIVE
Unit division: 0
Unit division: 0

## 2016-02-01 LAB — CBC
HEMATOCRIT: 33 % — AB (ref 36.0–46.0)
Hemoglobin: 10.5 g/dL — ABNORMAL LOW (ref 12.0–15.0)
MCH: 27.8 pg (ref 26.0–34.0)
MCHC: 31.8 g/dL (ref 30.0–36.0)
MCV: 87.3 fL (ref 78.0–100.0)
PLATELETS: 438 10*3/uL — AB (ref 150–400)
RBC: 3.78 MIL/uL — ABNORMAL LOW (ref 3.87–5.11)
RDW: 16.1 % — AB (ref 11.5–15.5)
WBC: 9.9 10*3/uL (ref 4.0–10.5)

## 2016-02-01 LAB — BASIC METABOLIC PANEL
Anion gap: 9 (ref 5–15)
BUN: 15 mg/dL (ref 6–20)
CALCIUM: 8.8 mg/dL — AB (ref 8.9–10.3)
CO2: 25 mmol/L (ref 22–32)
CREATININE: 0.61 mg/dL (ref 0.44–1.00)
Chloride: 105 mmol/L (ref 101–111)
GFR calc Af Amer: >60 mL/min (ref >60)
GFR calc non Af Amer: >60 mL/min (ref >60)
GLUCOSE: 126 mg/dL — AB (ref 65–99)
Potassium: 3.5 mmol/L (ref 3.5–5.1)
Sodium: 139 mmol/L (ref 135–145)

## 2016-02-01 LAB — GLUCOSE, CAPILLARY
Glucose-Capillary: 112 mg/dL — ABNORMAL HIGH (ref 65–99)
Glucose-Capillary: 101 mg/dL — ABNORMAL HIGH (ref 65–99)
Glucose-Capillary: 171 mg/dL — ABNORMAL HIGH (ref 65–99)

## 2016-02-01 LAB — MAGNESIUM: Magnesium: 1.8 mg/dL (ref 1.7–2.4)

## 2016-02-01 MED ORDER — MAGNESIUM OXIDE 400 (241.3 MG) MG PO TABS: 400.0000 mg | ORAL_TABLET | Freq: Two times a day (BID) | ORAL | Status: DC

## 2016-02-01 MED ORDER — ENSURE ENLIVE PO LIQD: 237.0000 mL | Freq: Three times a day (TID) | ORAL | Status: DC

## 2016-02-01 MED ORDER — CARVEDILOL 3.125 MG PO TABS: 3.1250 mg | ORAL_TABLET | Freq: Two times a day (BID) | ORAL | Status: DC

## 2016-02-01 MED ORDER — DOCUSATE SODIUM 100 MG PO CAPS: 100.0000 mg | ORAL_CAPSULE | Freq: Two times a day (BID) | ORAL | Status: DC | PRN

## 2016-02-01 MED ORDER — VALACYCLOVIR HCL 1 G PO TABS: 1000.0000 mg | ORAL_TABLET | Freq: Three times a day (TID) | ORAL | Status: DC

## 2016-02-01 MED ORDER — POTASSIUM CHLORIDE CRYS ER 20 MEQ PO TBCR
20.0000 meq | EXTENDED_RELEASE_TABLET | Freq: Every day | ORAL | Status: DC
Start: 2016-02-01 — End: 2016-04-03

## 2016-02-01 MED ORDER — AMOXICILLIN 500 MG PO CAPS: 500.0000 mg | ORAL_CAPSULE | Freq: Three times a day (TID) | ORAL | Status: DC

## 2016-02-01 MED ORDER — FUROSEMIDE 20 MG PO TABS: 20.0000 mg | ORAL_TABLET | Freq: Every day | ORAL | Status: DC

## 2016-02-01 MED ORDER — TRAMADOL HCL 50 MG PO TABS: 50.0000 mg | ORAL_TABLET | Freq: Four times a day (QID) | ORAL | Status: DC | PRN

## 2016-02-01 NOTE — Progress Notes (Addendum)
Nsg Discharge Note  Admit Date:  01/20/2016 Discharge date: 02/01/2016   [name] to be D/C'd Nursing Home per MD order.  AVS completed.  Copy for chart, and copy for patient signed, and dated. Patient/caregiver able to verbalize understanding.  Discharge Medication:   Medication List    STOP taking these medications        amLODipine 10 MG tablet  Commonly known as:  NORVASC     diclofenac 75 MG EC tablet  Commonly known as:  VOLTAREN     doxycycline 100 MG tablet  Commonly known as:  VIBRA-TABS     enoxaparin 30 MG/0.3ML injection  Commonly known as:  LOVENOX     hydrochlorothiazide 12.5 MG capsule  Commonly known as:  MICROZIDE     lisinopril 40 MG tablet  Commonly known as:  PRINIVIL,ZESTRIL     nitrofurantoin (macrocrystal-monohydrate) 100 MG capsule  Commonly known as:  MACROBID      TAKE these medications        amoxicillin 500 MG capsule  Commonly known as:  AMOXIL  Take 1 capsule (500 mg total) by mouth every 8 (eight) hours. X 4 weeks     aspirin EC 81 MG tablet  Take 81 mg by mouth daily.     atorvastatin 40 MG tablet  Commonly known as:  LIPITOR  TAKE ONE TABLET BY MOUTH DAILY.     budesonide-formoterol 160-4.5 MCG/ACT inhaler  Commonly known as:  SYMBICORT  Inhale 2 puffs into the lungs 2 (two) times daily.     carvedilol 3.125 MG tablet  Commonly known as:  COREG  Take 1 tablet (3.125 mg total) by mouth 2 (two) times daily with a meal.     colchicine 0.6 MG tablet  Take 0.6 mg by mouth daily as needed (gout flare).     docusate sodium 100 MG capsule  Commonly known as:  COLACE  Take 1 capsule (100 mg total) by mouth 2 (two) times daily as needed for mild constipation.     feeding supplement (ENSURE ENLIVE) Liqd  Take 237 mLs by mouth 3 (three) times daily between meals.     furosemide 20 MG tablet  Commonly known as:  LASIX  Take 1 tablet (20 mg total) by mouth daily.     Glucosamine-Chondroitin Caps  Take 1 capsule by mouth  daily.     magnesium oxide 400 (241.3 Mg) MG tablet  Commonly known as:  MAG-OX  Take 1 tablet (400 mg total) by mouth 2 (two) times daily.     multivitamin with minerals Tabs tablet  Take 1 tablet by mouth daily.     polyethylene glycol packet  Commonly known as:  MIRALAX / GLYCOLAX  Take 17 g by mouth daily.     potassium chloride SA 20 MEQ tablet  Commonly known as:  K-DUR,KLOR-CON  Take 1 tablet (20 mEq total) by mouth daily.     traMADol 50 MG tablet  Commonly known as:  ULTRAM  Take 1 tablet (50 mg total) by mouth every 6 (six) hours as needed for moderate pain or severe pain.     valACYclovir 1000 MG tablet  Commonly known as:  VALTREX  Take 1 tablet (1,000 mg total) by mouth 3 (three) times daily. For 3 more days        Discharge Assessment: Filed Vitals:   01/31/16 2101 02/01/16 0544  BP: 127/36 116/46  Pulse: 57 88  Temp: 97.6 F (36.4 C) 97.8 F (36.6 C)  Resp: 21 16   Skin clean, dry and intact without evidence of skin break down, no evidence of skin tears noted. IV catheter discontinued intact. Site without signs and symptoms of complications - no redness or edema noted at insertion site, patient denies c/o pain - only slight tenderness at site.  Dressing with slight pressure applied. Stage II noted to sacrum area. Stage II is circular, size of a pencil eraser tip. Also, left knee area treatment done earlier before discharge.   D/c Instructions-Education: Discharge instructions given to patient/family with verbalized understanding. D/c education completed with patient/family including follow up instructions, medication list, d/c activities limitations if indicated, with other d/c instructions as indicated by MD - patient able to verbalize understanding, all questions fully answered. Patient instructed to return to ED, call 911, or call MD for any changes in condition.  Patient escorted via EMS. Report called into [name].  Courtney Points, RN 02/01/2016 2:14  PM

## 2016-02-01 NOTE — Clinical Social Work Placement (Signed)
   CLINICAL SOCIAL WORK PLACEMENT  NOTE  Date:  02/01/2016  Patient Details  Name: [name] MRN: [id number] Date of Birth: 1941/03/18  Clinical Social Work is seeking post-discharge placement for this patient at the [place] level of care (*CSW will initial, date and re-position this form in  chart as items are completed):  Yes   Patient/family provided with [facility] Work Department's list of facilities offering this level of care within the geographic area requested by the patient (or if unable, by the patient's family).  Yes   Patient/family informed of their freedom to choose among providers that offer the needed level of care, that participate in Medicare, Medicaid or managed care program needed by the patient, have an available bed and are willing to accept the patient.  Yes   Patient/family informed of [facility]'s ownership interest in [facility] and [facility], as well as of the fact that they are under no obligation to receive care at these facilities.  PASRR submitted to EDS on       PASRR number received on       Existing PASRR number confirmed on 01/25/16     FL2 transmitted to all facilities in geographic area requested by pt/family on 01/25/16     FL2 transmitted to all facilities within larger geographic area on       Patient informed that his/her managed care company has contracts with or will negotiate with certain facilities, including the following:        Yes   Patient/family informed of bed offers received.  Patient chooses bed at [facility]     Physician recommends and patient chooses bed at      Patient to be transferred to [facility] on 02/01/16.  Patient to be transferred to facility by [facility]     Patient family notified on 02/01/16 of transfer.  Name of family member notified:  Jules Schick     PHYSICIAN       Additional Comment:    _______________________________________________ Benard Halsted, [place] 02/01/2016, 10:54 AM

## 2016-02-01 NOTE — Progress Notes (Signed)
[facility] will DC to: [facility] Anticipated DC date: 02/01/16 Family notified: Jules Schick Transport by: Corey Harold   Per MD patient ready for DC to [facility]. RN, patient, patient's family, and facility notified of DC. RN given number for report. DC packet on chart. Ambulance transport requested for patient.   CSW signing off.  Cedric Fishman, [place] Social Worker [phone]

## 2016-02-01 NOTE — Progress Notes (Signed)
Physical Therapy Treatment Patient Details Name: [name] MRN: [id number] DOB: [birth year] Today's Date: 02/01/2016    History of Present Illness pt is a 75 y/o female with pmh of CAD, HTN, CVD, PVD, MRSA, THR's R, TKA L with infection, I and D with poly exchange, skin grafting to L knee, admitted with 2 wk h/o crampy abdominal pain, frequency, urgency, dysuria and onset of n/v day of admit.  Working dx's--septic shock, ARF and obstructing uropathy, s/p right percutaneous nephostomy.    PT Comments    Progress every day, but slow due to mentation, decreased initiation, imobilized L knee and painfull R knee.  Follow Up Recommendations  SNF     Equipment Recommendations  Other (comment) (TBA)    Recommendations for Other Services       Precautions / Restrictions Precautions Precautions: Fall Required Braces or Orthoses: Knee Immobilizer - Left Knee Immobilizer - Left: On at all times Restrictions Weight Bearing Restrictions: Yes LLE Weight Bearing: Weight bearing as tolerated Other Position/Activity Restrictions: WBAT LLE for transfers only per daughter in law- need to clarify orders    Mobility  Bed Mobility Overal bed mobility: Needs Assistance Bed Mobility: Supine to Sit     Supine to sit: Mod assist Sit to supine: +2 for safety/equipment   General bed mobility comments: cues for initiation and guidance.  Pt helping a little more each session.  Transfers     Transfers: Sit to/from [facility] Sit to Stand: Mod assist;+2 physical assistance;+2 safety/equipment Stand pivot transfers: Mod assist;+2 physical assistance;+2 safety/equipment       General transfer comment: cues for hand placement, general guidance, assist to come forward and boost to standing, help to w/shift and pivot to [facility] and bed.  Ambulation/Gait             General Gait Details: still at pre-gait stage.   Stairs            Wheelchair Mobility    Modified  Rankin (Stroke Patients Only)       Balance Overall balance assessment: Needs assistance Sitting-balance support: Single extremity supported Sitting balance-Leahy Scale: Poor (moving toward fair with pt able to sit EOB without UE assist) Sitting balance - Comments: still listing posteriorly with fatigue     Standing balance-Leahy Scale: Poor Standing balance comment: reliant on UE support, but bearing weight throug LE's L >R                    Cognition Arousal/Alertness: Awake/alert Behavior During Therapy: WFL for tasks assessed/performed Overall Cognitive Status: Impaired/Different from baseline       Memory: Decreased short-term memory Following Commands: Follows one step commands with increased time     Problem Solving: Slow processing;Decreased initiation;Requires verbal cues;Requires tactile cues General Comments: clearing more each day    Exercises General Exercises - Lower Extremity Hip Flexion/Marching: AAROM;Right;10 reps;Supine    General Comments        Pertinent Vitals/Pain Pain Assessment: Faces Faces Pain Scale: Hurts even more Pain Location: R knee, L knee Pain Descriptors / Indicators: Sore Pain Intervention(s): Monitored during session;Repositioned;Limited activity within patient's tolerance    Home Living                      Prior Function            PT Goals (current goals can now be found in the care plan section) Acute Rehab PT Goals Patient Stated Goal: To get more  therapy and ultimately get home. PT Goal Formulation: With family Time For Goal Achievement: 02/07/16 Potential to Achieve Goals: Good Progress towards PT goals: Progressing toward goals    Frequency  Min 3X/week    PT Plan Current plan remains appropriate    Co-evaluation             End of Session Equipment Utilized During Treatment: Left knee immobilizer Activity Tolerance: Patient limited by fatigue;Patient limited by pain Patient left:  in chair;with call bell/phone within reach;with family/visitor present     Time: 1115-1204 PT Time Calculation (min) (ACUTE ONLY): 49 min  Charges:  $Therapeutic Activity: 23-37 mins $Self Care/Home Management: 04/14/23                    G Codes:      Naasir Carreira, Tessie Fass 02/01/2016, 12:22 PM 02/01/2016  Donnella Sham, PT [phone]  (pager)

## 2016-02-01 NOTE — Care Management Note (Signed)
Case Management Note  Patient Details  Name: [name] MRN: [id number] Date of Birth: 1941-06-07  Subjective/Objective:                    Action/Plan: Plan is to d/c to SNF/ [facility] today.  Expected Discharge Date:    02/01/2016              Expected Discharge Plan:  [place]  In-House Referral:  Clinical Social Work  Discharge planning Services  CM Consult  Status of Service:  Completed, signed off     Whitman Hero [place], [place] 02/01/2016, 10:45 AM

## 2016-02-01 NOTE — Discharge Summary (Signed)
Physician Discharge Summary   Patient ID: [name] MRN: [id number] DOB/AGE: 09/25/1940 75 y.o.  Admit date: 01/20/2016 Discharge date: 02/01/2016  Primary Care Physician:  Kirk Ruths, MD  Discharge Diagnoses:    . Septic shock ([place])   Obstructive uropathy, urosepsis with obstructing stone at right UVJ   Hydronephrosis with a urostomy tube  . Elevated troponin . Abnormal echocardiogram . Herpetic lesions on the oral mucosa   History of left total knee arthroplasty    Hypokalemia, hypomagnesemia   Acute systolic CHF exacerbation   Elevated troponin/probable demand ischemia  Acute on chronic Anemia  Consults:  Urology, Dr. Beatrix Fetters  Interventional radiology Cardiology Orthopedics Plastic surgery  Recommendations for Outpatient Follow-up:  1. Continue antibiotics for 1 month 2. Please repeat CBC/BMET at next visit 3. Patient needs close follow-ups with interventional radiology, urology, cardiology, orthopedics/plastic surgery    DIET:  heart healthy diet   Allergies:  No Known Allergies   DISCHARGE MEDICATIONS: Current Discharge Medication List    START taking these medications   Details  amoxicillin (AMOXIL) 500 MG capsule Take 1 capsule (500 mg total) by mouth every 8 (eight) hours. X 4 weeks    carvedilol (COREG) 3.125 MG tablet Take 1 tablet (3.125 mg total) by mouth 2 (two) times daily with a meal.    docusate sodium (COLACE) 100 MG capsule Take 1 capsule (100 mg total) by mouth 2 (two) times daily as needed for mild constipation. Qty: 10 capsule, Refills: 0    feeding supplement, ENSURE ENLIVE, (ENSURE ENLIVE) LIQD Take 237 mLs by mouth 3 (three) times daily between meals. Qty: 237 mL, Refills: 12    furosemide (LASIX) 20 MG tablet Take 1 tablet (20 mg total) by mouth daily. Qty: 30 tablet    magnesium oxide (MAG-OX) 400 (241.3 Mg) MG tablet Take 1 tablet (400 mg total) by mouth 2 (two) times daily.    potassium chloride SA (K-DUR,KLOR-CON)  20 MEQ tablet Take 1 tablet (20 mEq total) by mouth daily.    valACYclovir (VALTREX) 1000 MG tablet Take 1 tablet (1,000 mg total) by mouth 3 (three) times daily. For 3 more days      CONTINUE these medications which have CHANGED   Details  traMADol (ULTRAM) 50 MG tablet Take 1 tablet (50 mg total) by mouth every 6 (six) hours as needed for moderate pain or severe pain. Qty: 15 tablet, Refills: 0      CONTINUE these medications which have NOT CHANGED   Details  aspirin EC 81 MG tablet Take 81 mg by mouth daily.    atorvastatin (LIPITOR) 40 MG tablet TAKE ONE TABLET BY MOUTH DAILY. Qty: 90 tablet, Refills: 0    budesonide-formoterol (SYMBICORT) 160-4.5 MCG/ACT inhaler Inhale 2 puffs into the lungs 2 (two) times daily.    colchicine 0.6 MG tablet Take 0.6 mg by mouth daily as needed (gout flare).     Glucosamine-Chondroit-Vit C-Mn (GLUCOSAMINE-CHONDROITIN) CAPS Take 1 capsule by mouth daily.    Multiple Vitamin (MULITIVITAMIN WITH MINERALS) TABS Take 1 tablet by mouth daily.    polyethylene glycol (MIRALAX / GLYCOLAX) packet Take 17 g by mouth daily. Qty: 14 each, Refills: 0      STOP taking these medications     amLODipine (NORVASC) 10 MG tablet      diclofenac (VOLTAREN) 75 MG EC tablet      doxycycline (VIBRA-TABS) 100 MG tablet      hydrochlorothiazide (MICROZIDE) 12.5 MG capsule      lisinopril (PRINIVIL,ZESTRIL) 40  MG tablet      nitrofurantoin, macrocrystal-monohydrate, (MACROBID) 100 MG capsule      enoxaparin (LOVENOX) 30 MG/0.3ML injection          Brief H and P: For complete details please refer to admission H and P, but in brief [name] is a 75 year old female with a past medical history of a total left knee arthroplasty seizure performed on 09/28/2015 by Dr. Sharol Given of orthopedic surgery, was complicated by infection undergoing removal of hardware and placement of antibiotic beads on 10/12/2015, procedure performed by Dr. Sharol Given. Unfortunately she had  dehiscence of left knee arthroplasty undergoing irrigation and debridement on 10/18/2015.   She was admitted to the pulmonary critical care service on 01/20/2016 presenting with abdominal pain, dysuria, nausea, vomiting, found have a temperature of 103, hypotensive, and required pressor support. She was further worked up with CT scan of abdomen and pelvis that revealed obstructing UVJ stone with associated hydroureteronephrosis. Urology was consulted, she underwent nephrostomy tube placement. Urine cultures and blood cultures both growing Proteus mirabilis, with repeat blood cultures performed on 01/23/2016 showing no growth to date. She is on ceftriaxone 2 g IV every 24 hours. During this hospitalization she was seen by her orthopedic surgeon Dr Sharol Given who recommended at least 4 weeks of antibiotic coverage. On 01/24/2016 her care was transferred from [facility] to medicine.   Hospital Course:   Septic shock likely secondary to urinary source, nephrolithiasis and associated hydronephrosis, Proteus bacteremia, urine culture with Proteus -Patient was admitted to intensive care unit with septic shock  due to urosepsis, requiring IV pressor support and ICU admission. -Source of infection likely pyelonephritis/UTI, as CT scan revealed evidence of obstructive uropathy. Radiology reported obstructing 2 mm right UVJ stone with associated hydronephrosis. Both urine and blood cultures grew Proteus -Susceptibility testing showing organism is sensitive to beta lactams.  WBC count of 57.1 on 6/24 improved to 9.9 at the time of discharge on 7/5. Repeat blood cultures on 6/26 has shown no growth. Patient was placed on IV antibiotics and transitioned to oral amoxicillin per sensitivities to continue for 4 weeks.  Obstructive uropathy. -She was admitted for urosepsis, with CT scan showing obstructing stone and right UVJ that was associated with hydronephrosis. -She underwent urostomy tube placement, which appears to be  draining well. Foley catheter remains in place. Urology was consulted during this hospitalization.  -Treating UTI with ampicillin IV. Changed IV ampicillin to oral amoxicillin.  - Patient underwent internalization of JJ stents, 01/30/2016 by IR. Will likely need to follow-up with urology and interventional radiology as outpatient.   History of left total knee arthroplasty -Complicated by infection -During this hospitalization she was seen by plastics and orthopedic surgery -Source of sepsis likely to be urinary -Dr. Sharol Given recommending one month of antibiotic therapy   Hypokalemia/hypomagnesemia -Replaced   History of hypertension. -Continue Coreg 3.125 mg by mouth twice a day.   Acute systolic congestive heart failure -Transthoracic echocardiogram showing EF of 25-30%. She is clinically euvolemic, had been on Lasix 40 mg IV twice a day -Cardiology following recommending repeating 2-D echo done on 01/26/2016 with a EF of 35-40% with inferior, distal, apical and mid to distal septal hypokinesis. EF with some improvement from 01/22/2016 when EF was 25-30%. Patient has been transitioned to oral Lasix 20 mg daily. Continue Coreg. Per Cardiology will hold off on starting the ACE inhibitor due to patient's multiple comorbidities and increased risk of acute kidney injury.    Probable demand ischemia/Elevated troponin -Evaluated by cardiology,  lab work revealed peak troponin of 1.53 -Cardiology recommended a repeat echo. Repeat 2-D echo was done 01/26/2016 which are EF of 35-40% with inferior, distal apical, apical and mid to distal septal hypokinesis. Ejection fraction improved from prior ejection fraction of 25-30%. Per cardiology, patient likely had a stress-induced cardiomyopathy. It was felt patient would not benefit from the addition of ACE inhibitor due to her multiple comorbidities and increased risk of acute kidney injury. IV heparin been discontinued. Continue medical management. Patient  likely need repeat 2-D echo done approximately 3 months. -  Outpatient follow-up with cardiology.   Herpetic lesion oral mucosa Improving after starting patient on Valtrex 1 g 3 times, continue for 3 more days to complete full course    mild acute on chronic Anemia-no overt bleeding  Patient with a hemoglobin of 7.5. Patient with no overt bleeding. Due to demand ischemia/elevated troponins during this hospitalization, patient was transfused 2 units packed RBCs. Hemoglobin 10.5 at the time of discharge.   Day of Discharge BP 116/46 mmHg  Pulse 88  Temp(Src) 97.8 F (36.6 C) (Axillary)  Resp 16  Ht '5\' 2"'$  (1.575 m)  Wt 49.9 kg (110 lb 0.2 oz)  BMI 20.12 kg/m2  SpO2 97%  Physical Exam: General: Alert and awake oriented x3 not in any acute distress. HEENT: anicteric sclera, pupils reactive to light and accommodation, herpetic lesions on the oral mucosa improving  CVS: S1-S2 clear no murmur rubs or gallops Chest: clear to auscultation bilaterally, no wheezing rales or rhonchi Abdomen: soft nontender, nondistended, normal bowel sounds Extremities: no cyanosis, clubbing or edema noted bilaterally, left lower extremity dressing intact Neuro: Cranial nerves II-XII intact, no focal neurological deficits   The results of significant diagnostics from this hospitalization (including imaging, microbiology, ancillary and laboratory) are listed below for reference.    LAB RESULTS: Basic Metabolic Panel:  Recent Labs Lab 01/31/16 0521 02/01/16 0536  NA 137 139  K 4.4 3.5  CL 106 105  CO2 23 25  GLUCOSE 105* 126*  BUN 13 15  CREATININE 0.61 0.61  CALCIUM 8.7* 8.8*  MG 1.8 1.8   Liver Function Tests: No results for input(s): AST, ALT, ALKPHOS, BILITOT, PROT, ALBUMIN in the last 168 hours. No results for input(s): LIPASE, AMYLASE in the last 168 hours. No results for input(s): AMMONIA in the last 168 hours. CBC:  Recent Labs Lab 01/31/16 0521 01/31/16 1803 02/01/16 0536  WBC  12.8*  --  9.9  NEUTROABS 9.8*  --   --   HGB 7.5* 11.7* 10.5*  HCT 23.8* 35.6* 33.0*  MCV 90.8  --  87.3  PLT 443*  --  438*   Cardiac Enzymes: No results for input(s): CKTOTAL, CKMB, CKMBINDEX, TROPONINI in the last 168 hours. BNP: Invalid input(s): POCBNP CBG:  Recent Labs Lab 02/01/16 0353 02/01/16 0808  GLUCAP 112* 101*    Significant Diagnostic Studies:  Ct Abdomen Pelvis W Contrast  01/20/2016  CLINICAL DATA:  Right lower quadrant abdominal pain for 5 days with nausea and vomiting. EXAM: CT ABDOMEN AND PELVIS WITH CONTRAST TECHNIQUE: Multidetector CT imaging of the abdomen and pelvis was performed using the standard protocol following bolus administration of intravenous contrast. CONTRAST:  175m ISOVUE-300 IOPAMIDOL (ISOVUE-300) INJECTION 61% COMPARISON:  08/06/2005 CT abdomen/ pelvis. 02/14/2007 abdominal sonogram. FINDINGS: Lower chest: Centrilobular emphysema. Right middle lobe 3 mm pulmonary nodule (series 205/ image 6) is stable since 2007 and benign. Left main, left anterior descending, left circumflex and right coronary atherosclerosis. Hepatobiliary: Heterogeneous liver  parenchymal attenuation, with geographic areas of low attenuation in the left liver lobe. No definite liver surface irregularity. No liver mass. Mild diffuse periportal edema. Numerous subcentimeter calcified gallstones layering in the nondistended gallbladder, with no definite gallbladder wall thickening or pericholecystic fluid. No biliary ductal dilatation. Pancreas: Normal, with no mass or duct dilation. Spleen: Normal size. No mass. Adrenals/Urinary Tract: Mild diffuse irregular thickening of both adrenal glands without discrete adrenal mass, not definitely changed back to 2007, suggesting adrenal hyperplasia . Probable 2 mm stone at the right ureterovesical junction, noting limited visualization of the pelvic segment of the right ureter due to streak artifact from the right hip hardware. Separate 3 mm stone  in the mid right pelvic ureter, with mild right hydroureteronephrosis. Additional nonobstructing 4 mm interpolar and 5 mm lower right renal stones. Exophytic simple 3.2 cm renal cyst in the anterior mid to upper right kidney. No left hydronephrosis. Bladder poorly visualized due to streak artifact, with no gross bladder abnormality. Stomach/Bowel: Grossly normal stomach. Normal caliber small bowel with no small bowel wall thickening. Normal appendix. Mild sigmoid diverticulosis, with no large bowel wall thickening or pericolonic fat stranding. Prominent rectal stool with rectal diameter 8.0 cm. No definite rectal wall thickening. Vascular/Lymphatic: Atherosclerotic nonaneurysmal abdominal aorta. Insert vein No pathologically enlarged lymph nodes in the abdomen or pelvis. Reproductive: Status post hysterectomy, with no gross abnormal findings at the vaginal cuff. No adnexal mass. Other: No pneumoperitoneum, ascites or focal fluid collection. Musculoskeletal: No aggressive appearing focal osseous lesions. Right total hip arthroplasty is partially visualized. Moderate degenerative changes in the visualized thoracolumbar spine. Moderate to marked compression fracture of the T11 vertebral body of indeterminate chronicity, which appears new since the lateral 09/20/2015 chest radiograph. IMPRESSION: 1. Obstructing 2 mm right UVJ stone with mild right hydroureteronephrosis. Separate 3 mm stone in the mid right pelvic ureter. Additional nonobstructing right renal stones. 2. Suggestion of fecal impaction in the rectum. No definite rectal wall thickening. 3. Moderate to severe T11 vertebral compression fracture of indeterminate chronicity, which appears new since 09/20/2015. 4. Nonspecific liver parenchymal heterogeneity without discrete liver mass, favor hepatic steatosis, cannot exclude hepatic fibrosis. 5. Additional findings include left main and 3 vessel coronary atherosclerosis, centrilobular emphysema, mild sigmoid  diverticulosis and cholelithiasis. Electronically Signed   By: Ilona Sorrel M.D.   On: 01/20/2016 18:38   Dg Chest Port 1 View  01/21/2016  CLINICAL DATA:  75 year old female with a history of hypoxia EXAM: PORTABLE CHEST 1 VIEW COMPARISON:  01/20/2016 FINDINGS: Cardiomediastinal silhouette unchanged, though partially obscured by overlying lung and pleural disease. Worsening interstitial and airspace opacities. Worsening interlobular septal thickening. Low lung volumes. Unchanged right IJ approach central venous catheter which appears to terminate superior vena cava. Calcifications of the aortic arch. No displaced fracture. IMPRESSION: Worsening interstitial and airspace opacities, which is most compatible with worsening edema and likely small pleural effusions. Underlying infection not excluded. Aortic atherosclerosis. Unchanged right IJ central catheter. Signed, Dulcy Fanny. Earleen Newport, DO [facility] Radiology Electronically Signed   By: Corrie Mckusick D.O.   On: 01/21/2016 21:31   Dg Chest Portable 1 View  01/20/2016  CLINICAL DATA:  Central line placement EXAM: PORTABLE CHEST 1 VIEW COMPARISON:  6/23/ 17 FINDINGS: Cardiomediastinal silhouette is stable. Streaky interstitial prominence/ pneumonitis bilateral lower lobe and right upper lobe again noted. There is right IJ central line with tip in SVC. No pneumothorax. IMPRESSION: Right IJ central line in place.  No pneumothorax. Electronically Signed  By: Lahoma Crocker M.D.   On: 01/20/2016 20:51   Dg Chest Portable 1 View  01/20/2016  CLINICAL DATA:  Nausea and vomiting.  Pain. EXAM: PORTABLE CHEST 1 VIEW COMPARISON:  09/20/2015. FINDINGS: Mediastinum and hilar structures are normal. Mild bilateral upper lobe and bibasilar infiltrates. Small bilateral pleural effusions cannot be excluded. Heart size normal. Degenerative changes both shoulders. IMPRESSION: Mild bilateral upper lobe and bibasilar pulmonary infiltrates  consistent with pneumonia. Electronically Signed   By: Marcello Moores  Register   On: 01/20/2016 17:00   Ir Nephrostomy Placement Right  01/21/2016  CLINICAL DATA:  Right ureteral obstruction from calculus causing hydronephrosis and acute sepsis. The patient requires emergent percutaneous nephrostomy tube to decompress the right kidney. EXAM: 1. ULTRASOUND GUIDANCE FOR PUNCTURE OF THE RIGHT RENAL COLLECTING SYSTEM. 2. RIGHT PERCUTANEOUS NEPHROSTOMY TUBE PLACEMENT. COMPARISON:  CT of the abdomen and pelvis earlier today on 01/20/2016. ANESTHESIA/SEDATION: 1.0 mg IV Versed; 25 mcg IV Fentanyl. Total Moderate Sedation Time 15 minutes The patient's level of consciousness and physiologic status were continuously monitored during the procedure by Radiology nursing. CONTRAST:  20 ml Omnipaque 300 MEDICATIONS: No additional medications. Scheduled IV Fortaz and vancomycin were started just prior to the procedure. FLUOROSCOPY TIME:  1 minutes and 6 seconds. PROCEDURE: The procedure, risks, benefits, and alternatives were explained to the patient's son. Questions regarding the procedure were encouraged and answered. The patient's son understands and consents to the procedure. The right flank region was prepped with chlorhexidine in a sterile fashion, and a sterile drape was applied covering the operative field. A sterile gown and sterile gloves were used for the procedure. Local anesthesia was provided with 1% Lidocaine. Ultrasound was used to localize the right kidney. Under direct ultrasound guidance, a 21 gauge needle was advanced into the renal collecting system. Ultrasound image documentation was performed. Aspiration of urine sample was performed followed by contrast injection. A transitional dilator was advanced over a guidewire. Percutaneous tract dilatation was then performed over the guidewire. A 10 -French percutaneous nephrostomy tube was then advanced and formed in the collecting system. Catheter position was confirmed  by fluoroscopy after contrast injection. The catheter was secured at the skin with a Prolene retention suture and Stat-Lock device. A gravity bag was placed. COMPLICATIONS: None. FINDINGS: Ultrasound demonstrates moderate right hydronephrosis. After access of the collecting system, there was return of blood tinged urine. A sample was sent for culture analysis. The 10 French nephrostomy tube was formed in the renal pelvis. IMPRESSION: Right percutaneous nephrostomy tube placement for acute decompression of the right kidney. A 10 French catheter was placed and formed in the renal pelvis. This tube was attached to a gravity drainage bag. A sample of urine was sent for culture analysis. Electronically Signed   By: Aletta Edouard M.D.   On: 01/21/2016 09:12    2D ECHO: Study Conclusions  - Study data: M-mode, limited 2D, limited spectral Doppler, and  color Doppler. - Left ventricle: The cavity size was normal. Wall thickness was  normal. Systolic function was moderately reduced. The estimated  ejection fraction was in the range of 35% to 40%. There is  inferior, distal apical, apical and mid to distal septal  hypokinesis. - Aortic valve: Calcified leaflets with restriction. There was  moderate regurgitation.  Impressions:  - Limited study. Compared to a prior echo on 01/22/2016, the LVEF  has improved to 35-40%.   Disposition and Follow-up:    [place] Follow-up Information  Follow up with DAHLSTEDT, Lillette Boxer, MD. Schedule an appointment as soon as possible for a visit in 10 days.   Specialty:  Urology   Why:  we will call to schedule followup appt, for hospital follow-up   Contact information:   [place] [address] [phone]       Follow up with Kirk Ruths, MD. Schedule an appointment as soon as possible for a visit in 10 days.   Specialty:  Cardiology   Why:  for hospital follow-up   Contact  information:   [place] [address] [phone]       Follow up with DUDA,MARCUS V, MD. Schedule an appointment as soon as possible for a visit in 10 days.   Specialty:  Orthopedic Surgery   Why:  for hospital follow-up   Contact information:   [place] [address] [phone]       Follow up with HOSS, ART A, MD. Schedule an appointment as soon as possible for a visit in 1 week.   Specialty:  Interventional Radiology   Why:  for hospital follow-up in 7-10 days    Contact information:   [place] [address] [phone]        Time spent on Discharge: 49mns   Signed:   RAI,RIPUDEEP M.D. [facility] 02/01/2016, 10:19 AM Pager: [phone]

## 2016-02-03 DIAGNOSIS — R262 Difficulty in walking, not elsewhere classified: Secondary | ICD-10-CM | POA: Diagnosis not present

## 2016-02-03 DIAGNOSIS — N200 Calculus of kidney: Secondary | ICD-10-CM | POA: Diagnosis not present

## 2016-02-03 DIAGNOSIS — N390 Urinary tract infection, site not specified: Secondary | ICD-10-CM | POA: Diagnosis not present

## 2016-02-03 DIAGNOSIS — Z471 Aftercare following joint replacement surgery: Secondary | ICD-10-CM | POA: Diagnosis not present

## 2016-02-04 DIAGNOSIS — J449 Chronic obstructive pulmonary disease, unspecified: Secondary | ICD-10-CM | POA: Diagnosis not present

## 2016-02-04 DIAGNOSIS — K625 Hemorrhage of anus and rectum: Secondary | ICD-10-CM | POA: Diagnosis not present

## 2016-02-04 DIAGNOSIS — R7989 Other specified abnormal findings of blood chemistry: Secondary | ICD-10-CM | POA: Diagnosis not present

## 2016-02-04 DIAGNOSIS — D649 Anemia, unspecified: Secondary | ICD-10-CM | POA: Diagnosis not present

## 2016-02-04 DIAGNOSIS — E876 Hypokalemia: Secondary | ICD-10-CM | POA: Diagnosis not present

## 2016-02-04 DIAGNOSIS — Z66 Do not resuscitate: Secondary | ICD-10-CM | POA: Diagnosis present

## 2016-02-04 DIAGNOSIS — K922 Gastrointestinal hemorrhage, unspecified: Secondary | ICD-10-CM | POA: Diagnosis not present

## 2016-02-04 DIAGNOSIS — K648 Other hemorrhoids: Secondary | ICD-10-CM | POA: Diagnosis present

## 2016-02-04 DIAGNOSIS — K51311 Ulcerative (chronic) rectosigmoiditis with rectal bleeding: Secondary | ICD-10-CM | POA: Diagnosis not present

## 2016-02-04 DIAGNOSIS — E8809 Other disorders of plasma-protein metabolism, not elsewhere classified: Secondary | ICD-10-CM | POA: Diagnosis not present

## 2016-02-04 DIAGNOSIS — K1379 Other lesions of oral mucosa: Secondary | ICD-10-CM | POA: Diagnosis not present

## 2016-02-04 DIAGNOSIS — R231 Pallor: Secondary | ICD-10-CM | POA: Diagnosis not present

## 2016-02-04 DIAGNOSIS — I5021 Acute systolic (congestive) heart failure: Secondary | ICD-10-CM | POA: Diagnosis not present

## 2016-02-04 DIAGNOSIS — K633 Ulcer of intestine: Secondary | ICD-10-CM | POA: Diagnosis not present

## 2016-02-04 DIAGNOSIS — K5660 Unspecified intestinal obstruction: Secondary | ICD-10-CM | POA: Diagnosis not present

## 2016-02-04 DIAGNOSIS — I959 Hypotension, unspecified: Secondary | ICD-10-CM | POA: Diagnosis not present

## 2016-02-04 DIAGNOSIS — K31819 Angiodysplasia of stomach and duodenum without bleeding: Secondary | ICD-10-CM | POA: Diagnosis present

## 2016-02-04 DIAGNOSIS — D62 Acute posthemorrhagic anemia: Secondary | ICD-10-CM | POA: Diagnosis not present

## 2016-02-04 DIAGNOSIS — K921 Melena: Secondary | ICD-10-CM | POA: Diagnosis not present

## 2016-02-04 DIAGNOSIS — Z436 Encounter for attention to other artificial openings of urinary tract: Secondary | ICD-10-CM | POA: Diagnosis not present

## 2016-02-04 DIAGNOSIS — K5730 Diverticulosis of large intestine without perforation or abscess without bleeding: Secondary | ICD-10-CM | POA: Diagnosis present

## 2016-02-04 DIAGNOSIS — N132 Hydronephrosis with renal and ureteral calculous obstruction: Secondary | ICD-10-CM | POA: Diagnosis not present

## 2016-02-04 DIAGNOSIS — Z87891 Personal history of nicotine dependence: Secondary | ICD-10-CM | POA: Diagnosis not present

## 2016-02-04 DIAGNOSIS — R6521 Severe sepsis with septic shock: Secondary | ICD-10-CM | POA: Diagnosis not present

## 2016-02-04 DIAGNOSIS — K626 Ulcer of anus and rectum: Secondary | ICD-10-CM | POA: Diagnosis not present

## 2016-02-04 DIAGNOSIS — N390 Urinary tract infection, site not specified: Secondary | ICD-10-CM | POA: Diagnosis not present

## 2016-02-04 DIAGNOSIS — Q2733 Arteriovenous malformation of digestive system vessel: Secondary | ICD-10-CM | POA: Diagnosis not present

## 2016-02-04 DIAGNOSIS — D500 Iron deficiency anemia secondary to blood loss (chronic): Secondary | ICD-10-CM | POA: Diagnosis not present

## 2016-02-04 DIAGNOSIS — R931 Abnormal findings on diagnostic imaging of heart and coronary circulation: Secondary | ICD-10-CM | POA: Diagnosis not present

## 2016-02-04 DIAGNOSIS — I248 Other forms of acute ischemic heart disease: Secondary | ICD-10-CM | POA: Diagnosis not present

## 2016-02-04 DIAGNOSIS — I10 Essential (primary) hypertension: Secondary | ICD-10-CM | POA: Diagnosis not present

## 2016-02-04 DIAGNOSIS — I509 Heart failure, unspecified: Secondary | ICD-10-CM | POA: Diagnosis not present

## 2016-02-04 DIAGNOSIS — K523 Indeterminate colitis: Secondary | ICD-10-CM | POA: Diagnosis not present

## 2016-02-04 DIAGNOSIS — I4581 Long QT syndrome: Secondary | ICD-10-CM | POA: Diagnosis not present

## 2016-02-04 DIAGNOSIS — B964 Proteus (mirabilis) (morganii) as the cause of diseases classified elsewhere: Secondary | ICD-10-CM | POA: Diagnosis not present

## 2016-02-04 DIAGNOSIS — K449 Diaphragmatic hernia without obstruction or gangrene: Secondary | ICD-10-CM | POA: Diagnosis present

## 2016-02-04 DIAGNOSIS — Z96652 Presence of left artificial knee joint: Secondary | ICD-10-CM | POA: Diagnosis present

## 2016-02-04 DIAGNOSIS — N139 Obstructive and reflux uropathy, unspecified: Secondary | ICD-10-CM | POA: Diagnosis not present

## 2016-02-06 ENCOUNTER — Telehealth: Payer: Self-pay | Admitting: Cardiology

## 2016-02-07 DIAGNOSIS — S065X0A Traumatic subdural hemorrhage without loss of consciousness, initial encounter: Secondary | ICD-10-CM | POA: Diagnosis not present

## 2016-02-07 DIAGNOSIS — K529 Noninfective gastroenteritis and colitis, unspecified: Secondary | ICD-10-CM | POA: Diagnosis not present

## 2016-02-07 DIAGNOSIS — S0990XD Unspecified injury of head, subsequent encounter: Secondary | ICD-10-CM | POA: Diagnosis not present

## 2016-02-07 DIAGNOSIS — M6281 Muscle weakness (generalized): Secondary | ICD-10-CM | POA: Diagnosis not present

## 2016-02-07 DIAGNOSIS — I420 Dilated cardiomyopathy: Secondary | ICD-10-CM | POA: Diagnosis not present

## 2016-02-07 DIAGNOSIS — N132 Hydronephrosis with renal and ureteral calculous obstruction: Secondary | ICD-10-CM | POA: Diagnosis not present

## 2016-02-07 DIAGNOSIS — E785 Hyperlipidemia, unspecified: Secondary | ICD-10-CM | POA: Diagnosis not present

## 2016-02-07 DIAGNOSIS — I5022 Chronic systolic (congestive) heart failure: Secondary | ICD-10-CM | POA: Diagnosis not present

## 2016-02-07 DIAGNOSIS — Z87891 Personal history of nicotine dependence: Secondary | ICD-10-CM | POA: Diagnosis not present

## 2016-02-07 DIAGNOSIS — I110 Hypertensive heart disease with heart failure: Secondary | ICD-10-CM | POA: Diagnosis not present

## 2016-02-07 DIAGNOSIS — D649 Anemia, unspecified: Secondary | ICD-10-CM | POA: Diagnosis not present

## 2016-02-07 DIAGNOSIS — I10 Essential (primary) hypertension: Secondary | ICD-10-CM | POA: Diagnosis not present

## 2016-02-07 DIAGNOSIS — Z79899 Other long term (current) drug therapy: Secondary | ICD-10-CM | POA: Diagnosis not present

## 2016-02-07 DIAGNOSIS — M170 Bilateral primary osteoarthritis of knee: Secondary | ICD-10-CM | POA: Diagnosis not present

## 2016-02-07 DIAGNOSIS — Q2733 Arteriovenous malformation of digestive system vessel: Secondary | ICD-10-CM | POA: Diagnosis not present

## 2016-02-07 DIAGNOSIS — R7989 Other specified abnormal findings of blood chemistry: Secondary | ICD-10-CM | POA: Diagnosis not present

## 2016-02-07 DIAGNOSIS — R6521 Severe sepsis with septic shock: Secondary | ICD-10-CM | POA: Diagnosis not present

## 2016-02-07 DIAGNOSIS — R220 Localized swelling, mass and lump, head: Secondary | ICD-10-CM | POA: Diagnosis not present

## 2016-02-07 DIAGNOSIS — N139 Obstructive and reflux uropathy, unspecified: Secondary | ICD-10-CM | POA: Diagnosis not present

## 2016-02-07 DIAGNOSIS — N131 Hydronephrosis with ureteral stricture, not elsewhere classified: Secondary | ICD-10-CM | POA: Diagnosis not present

## 2016-02-07 DIAGNOSIS — M25511 Pain in right shoulder: Secondary | ICD-10-CM | POA: Diagnosis not present

## 2016-02-07 DIAGNOSIS — I5021 Acute systolic (congestive) heart failure: Secondary | ICD-10-CM | POA: Diagnosis not present

## 2016-02-07 DIAGNOSIS — F0390 Unspecified dementia without behavioral disturbance: Secondary | ICD-10-CM | POA: Diagnosis not present

## 2016-02-07 DIAGNOSIS — L89620 Pressure ulcer of left heel, unstageable: Secondary | ICD-10-CM | POA: Diagnosis not present

## 2016-02-07 DIAGNOSIS — R262 Difficulty in walking, not elsewhere classified: Secondary | ICD-10-CM | POA: Diagnosis not present

## 2016-02-07 DIAGNOSIS — I509 Heart failure, unspecified: Secondary | ICD-10-CM | POA: Diagnosis not present

## 2016-02-07 DIAGNOSIS — Z96651 Presence of right artificial knee joint: Secondary | ICD-10-CM | POA: Diagnosis not present

## 2016-02-07 DIAGNOSIS — S0011XA Contusion of right eyelid and periocular area, initial encounter: Secondary | ICD-10-CM | POA: Diagnosis not present

## 2016-02-07 DIAGNOSIS — I2510 Atherosclerotic heart disease of native coronary artery without angina pectoris: Secondary | ICD-10-CM | POA: Diagnosis not present

## 2016-02-07 DIAGNOSIS — Z043 Encounter for examination and observation following other accident: Secondary | ICD-10-CM | POA: Diagnosis not present

## 2016-02-07 DIAGNOSIS — M25561 Pain in right knee: Secondary | ICD-10-CM | POA: Diagnosis not present

## 2016-02-07 DIAGNOSIS — D509 Iron deficiency anemia, unspecified: Secondary | ICD-10-CM | POA: Diagnosis not present

## 2016-02-07 DIAGNOSIS — S0990XA Unspecified injury of head, initial encounter: Secondary | ICD-10-CM | POA: Diagnosis not present

## 2016-02-07 DIAGNOSIS — S0083XA Contusion of other part of head, initial encounter: Secondary | ICD-10-CM | POA: Diagnosis not present

## 2016-02-07 DIAGNOSIS — B964 Proteus (mirabilis) (morganii) as the cause of diseases classified elsewhere: Secondary | ICD-10-CM | POA: Diagnosis not present

## 2016-02-07 DIAGNOSIS — K625 Hemorrhage of anus and rectum: Secondary | ICD-10-CM | POA: Diagnosis not present

## 2016-02-07 DIAGNOSIS — Z436 Encounter for attention to other artificial openings of urinary tract: Secondary | ICD-10-CM | POA: Diagnosis not present

## 2016-02-07 DIAGNOSIS — R931 Abnormal findings on diagnostic imaging of heart and coronary circulation: Secondary | ICD-10-CM | POA: Diagnosis not present

## 2016-02-07 DIAGNOSIS — M25562 Pain in left knee: Secondary | ICD-10-CM | POA: Diagnosis not present

## 2016-02-07 DIAGNOSIS — I248 Other forms of acute ischemic heart disease: Secondary | ICD-10-CM | POA: Diagnosis not present

## 2016-02-07 DIAGNOSIS — N390 Urinary tract infection, site not specified: Secondary | ICD-10-CM | POA: Diagnosis not present

## 2016-02-07 DIAGNOSIS — K922 Gastrointestinal hemorrhage, unspecified: Secondary | ICD-10-CM | POA: Diagnosis not present

## 2016-02-07 DIAGNOSIS — N201 Calculus of ureter: Secondary | ICD-10-CM | POA: Diagnosis not present

## 2016-02-07 DIAGNOSIS — K633 Ulcer of intestine: Secondary | ICD-10-CM | POA: Diagnosis not present

## 2016-02-07 DIAGNOSIS — Z96652 Presence of left artificial knee joint: Secondary | ICD-10-CM | POA: Diagnosis not present

## 2016-02-07 DIAGNOSIS — S81002D Unspecified open wound, left knee, subsequent encounter: Secondary | ICD-10-CM | POA: Diagnosis not present

## 2016-02-07 DIAGNOSIS — D62 Acute posthemorrhagic anemia: Secondary | ICD-10-CM | POA: Diagnosis not present

## 2016-02-07 DIAGNOSIS — L8995 Pressure ulcer of unspecified site, unstageable: Secondary | ICD-10-CM | POA: Diagnosis not present

## 2016-02-07 DIAGNOSIS — E876 Hypokalemia: Secondary | ICD-10-CM | POA: Diagnosis not present

## 2016-02-07 DIAGNOSIS — K1379 Other lesions of oral mucosa: Secondary | ICD-10-CM | POA: Diagnosis not present

## 2016-02-08 DIAGNOSIS — D509 Iron deficiency anemia, unspecified: Secondary | ICD-10-CM | POA: Diagnosis not present

## 2016-02-08 DIAGNOSIS — N390 Urinary tract infection, site not specified: Secondary | ICD-10-CM | POA: Diagnosis not present

## 2016-02-08 DIAGNOSIS — K625 Hemorrhage of anus and rectum: Secondary | ICD-10-CM | POA: Diagnosis not present

## 2016-02-08 DIAGNOSIS — R262 Difficulty in walking, not elsewhere classified: Secondary | ICD-10-CM | POA: Diagnosis not present

## 2016-02-09 ENCOUNTER — Telehealth: Payer: Self-pay | Admitting: Nurse Practitioner

## 2016-02-09 DIAGNOSIS — L8995 Pressure ulcer of unspecified site, unstageable: Secondary | ICD-10-CM | POA: Diagnosis not present

## 2016-02-09 NOTE — Telephone Encounter (Signed)
Closed encounter °

## 2016-02-09 NOTE — Telephone Encounter (Signed)
Received records from [facility] for appointment on 03/02/16 with Ignacia Bayley, NP.  Records given to [employer] (medical records) for Chris's schedule on 03/02/16. lp

## 2016-02-10 DIAGNOSIS — M6281 Muscle weakness (generalized): Secondary | ICD-10-CM | POA: Diagnosis not present

## 2016-02-21 DIAGNOSIS — S81002D Unspecified open wound, left knee, subsequent encounter: Secondary | ICD-10-CM | POA: Diagnosis not present

## 2016-02-21 DIAGNOSIS — Z96652 Presence of left artificial knee joint: Secondary | ICD-10-CM | POA: Diagnosis not present

## 2016-02-22 DIAGNOSIS — M25562 Pain in left knee: Secondary | ICD-10-CM | POA: Diagnosis not present

## 2016-02-22 DIAGNOSIS — M25561 Pain in right knee: Secondary | ICD-10-CM | POA: Diagnosis not present

## 2016-02-22 DIAGNOSIS — M170 Bilateral primary osteoarthritis of knee: Secondary | ICD-10-CM | POA: Diagnosis not present

## 2016-02-24 DIAGNOSIS — N131 Hydronephrosis with ureteral stricture, not elsewhere classified: Secondary | ICD-10-CM | POA: Diagnosis not present

## 2016-02-24 DIAGNOSIS — N201 Calculus of ureter: Secondary | ICD-10-CM | POA: Diagnosis not present

## 2016-02-24 DIAGNOSIS — N132 Hydronephrosis with renal and ureteral calculous obstruction: Secondary | ICD-10-CM | POA: Diagnosis not present

## 2016-02-28 DIAGNOSIS — N132 Hydronephrosis with renal and ureteral calculous obstruction: Secondary | ICD-10-CM | POA: Diagnosis not present

## 2016-02-28 DIAGNOSIS — E785 Hyperlipidemia, unspecified: Secondary | ICD-10-CM | POA: Diagnosis not present

## 2016-02-28 DIAGNOSIS — Z79899 Other long term (current) drug therapy: Secondary | ICD-10-CM | POA: Diagnosis not present

## 2016-02-28 DIAGNOSIS — K1379 Other lesions of oral mucosa: Secondary | ICD-10-CM | POA: Diagnosis not present

## 2016-02-28 DIAGNOSIS — M25511 Pain in right shoulder: Secondary | ICD-10-CM | POA: Diagnosis not present

## 2016-02-28 DIAGNOSIS — R931 Abnormal findings on diagnostic imaging of heart and coronary circulation: Secondary | ICD-10-CM | POA: Diagnosis not present

## 2016-02-28 DIAGNOSIS — Z87891 Personal history of nicotine dependence: Secondary | ICD-10-CM | POA: Diagnosis not present

## 2016-02-28 DIAGNOSIS — Z96652 Presence of left artificial knee joint: Secondary | ICD-10-CM | POA: Diagnosis not present

## 2016-02-28 DIAGNOSIS — B964 Proteus (mirabilis) (morganii) as the cause of diseases classified elsewhere: Secondary | ICD-10-CM | POA: Diagnosis not present

## 2016-02-28 DIAGNOSIS — L89620 Pressure ulcer of left heel, unstageable: Secondary | ICD-10-CM | POA: Diagnosis not present

## 2016-02-28 DIAGNOSIS — R220 Localized swelling, mass and lump, head: Secondary | ICD-10-CM | POA: Diagnosis not present

## 2016-02-28 DIAGNOSIS — Z043 Encounter for examination and observation following other accident: Secondary | ICD-10-CM | POA: Diagnosis not present

## 2016-02-28 DIAGNOSIS — I5022 Chronic systolic (congestive) heart failure: Secondary | ICD-10-CM | POA: Diagnosis not present

## 2016-02-28 DIAGNOSIS — D62 Acute posthemorrhagic anemia: Secondary | ICD-10-CM | POA: Diagnosis not present

## 2016-02-28 DIAGNOSIS — S0011XA Contusion of right eyelid and periocular area, initial encounter: Secondary | ICD-10-CM | POA: Diagnosis not present

## 2016-02-28 DIAGNOSIS — I2510 Atherosclerotic heart disease of native coronary artery without angina pectoris: Secondary | ICD-10-CM | POA: Diagnosis not present

## 2016-02-28 DIAGNOSIS — Z96651 Presence of right artificial knee joint: Secondary | ICD-10-CM | POA: Diagnosis not present

## 2016-02-28 DIAGNOSIS — I110 Hypertensive heart disease with heart failure: Secondary | ICD-10-CM | POA: Diagnosis not present

## 2016-02-28 DIAGNOSIS — N390 Urinary tract infection, site not specified: Secondary | ICD-10-CM | POA: Diagnosis not present

## 2016-02-28 DIAGNOSIS — S065X0A Traumatic subdural hemorrhage without loss of consciousness, initial encounter: Secondary | ICD-10-CM | POA: Diagnosis not present

## 2016-02-28 DIAGNOSIS — S0083XA Contusion of other part of head, initial encounter: Secondary | ICD-10-CM | POA: Diagnosis not present

## 2016-02-28 DIAGNOSIS — K922 Gastrointestinal hemorrhage, unspecified: Secondary | ICD-10-CM | POA: Diagnosis not present

## 2016-02-28 DIAGNOSIS — S81002D Unspecified open wound, left knee, subsequent encounter: Secondary | ICD-10-CM | POA: Diagnosis not present

## 2016-02-28 DIAGNOSIS — K529 Noninfective gastroenteritis and colitis, unspecified: Secondary | ICD-10-CM | POA: Diagnosis not present

## 2016-02-28 DIAGNOSIS — F0390 Unspecified dementia without behavioral disturbance: Secondary | ICD-10-CM | POA: Diagnosis not present

## 2016-02-28 DIAGNOSIS — D649 Anemia, unspecified: Secondary | ICD-10-CM | POA: Diagnosis not present

## 2016-02-28 DIAGNOSIS — I248 Other forms of acute ischemic heart disease: Secondary | ICD-10-CM | POA: Diagnosis not present

## 2016-02-28 DIAGNOSIS — R7989 Other specified abnormal findings of blood chemistry: Secondary | ICD-10-CM | POA: Diagnosis not present

## 2016-02-28 DIAGNOSIS — N139 Obstructive and reflux uropathy, unspecified: Secondary | ICD-10-CM | POA: Diagnosis not present

## 2016-02-28 DIAGNOSIS — E876 Hypokalemia: Secondary | ICD-10-CM | POA: Diagnosis not present

## 2016-02-28 DIAGNOSIS — S0990XD Unspecified injury of head, subsequent encounter: Secondary | ICD-10-CM | POA: Diagnosis not present

## 2016-02-28 DIAGNOSIS — I420 Dilated cardiomyopathy: Secondary | ICD-10-CM | POA: Diagnosis not present

## 2016-02-28 DIAGNOSIS — I10 Essential (primary) hypertension: Secondary | ICD-10-CM | POA: Diagnosis not present

## 2016-02-28 DIAGNOSIS — N201 Calculus of ureter: Secondary | ICD-10-CM | POA: Diagnosis not present

## 2016-02-28 DIAGNOSIS — I509 Heart failure, unspecified: Secondary | ICD-10-CM | POA: Diagnosis not present

## 2016-02-28 DIAGNOSIS — R6521 Severe sepsis with septic shock: Secondary | ICD-10-CM | POA: Diagnosis not present

## 2016-02-28 DIAGNOSIS — I5021 Acute systolic (congestive) heart failure: Secondary | ICD-10-CM | POA: Diagnosis not present

## 2016-02-28 DIAGNOSIS — Q2733 Arteriovenous malformation of digestive system vessel: Secondary | ICD-10-CM | POA: Diagnosis not present

## 2016-02-28 DIAGNOSIS — Z436 Encounter for attention to other artificial openings of urinary tract: Secondary | ICD-10-CM | POA: Diagnosis not present

## 2016-02-28 DIAGNOSIS — M25561 Pain in right knee: Secondary | ICD-10-CM | POA: Diagnosis not present

## 2016-02-28 DIAGNOSIS — S0990XA Unspecified injury of head, initial encounter: Secondary | ICD-10-CM | POA: Diagnosis not present

## 2016-03-02 ENCOUNTER — Ambulatory Visit (INDEPENDENT_AMBULATORY_CARE_PROVIDER_SITE_OTHER): Payer: Medicare Other | Admitting: Nurse Practitioner

## 2016-03-02 ENCOUNTER — Encounter: Payer: Self-pay | Admitting: Nurse Practitioner

## 2016-03-02 ENCOUNTER — Encounter (INDEPENDENT_AMBULATORY_CARE_PROVIDER_SITE_OTHER): Payer: Self-pay

## 2016-03-02 VITALS — BP 115/72 | HR 98 | Ht 62.0 in | Wt 98.6 lb

## 2016-03-02 DIAGNOSIS — I110 Hypertensive heart disease with heart failure: Secondary | ICD-10-CM | POA: Diagnosis not present

## 2016-03-02 DIAGNOSIS — I420 Dilated cardiomyopathy: Secondary | ICD-10-CM

## 2016-03-02 DIAGNOSIS — E785 Hyperlipidemia, unspecified: Secondary | ICD-10-CM

## 2016-03-02 DIAGNOSIS — I5022 Chronic systolic (congestive) heart failure: Secondary | ICD-10-CM

## 2016-03-02 DIAGNOSIS — I2510 Atherosclerotic heart disease of native coronary artery without angina pectoris: Secondary | ICD-10-CM | POA: Diagnosis not present

## 2016-03-02 DIAGNOSIS — I119 Hypertensive heart disease without heart failure: Secondary | ICD-10-CM | POA: Insufficient documentation

## 2016-03-02 NOTE — Patient Instructions (Signed)
Medication Instructions: Ignacia Bayley, NP, recommends that you continue on your current medications as directed. Please refer to the Current Medication list given to you today.  Labwork: NONE ORDERED  Testing/Procedures: 1. Echocardiogram - Your physician has requested that you have an echocardiogram. Echocardiography is a painless test that uses sound waves to create images of your heart. It provides your doctor with information about the size and shape of your heart and how well your heart's chambers and valves are working. This procedure takes approximately one hour. There are no restrictions for this procedure. This will be performed at our [facility] location - [address], [address].  Follow-up: Gerald Stabs recommends that you schedule a follow-up appointment after the echocardiogram with Dr [name].  If you need a refill on your cardiac medications before your next appointment, please call your pharmacy.

## 2016-03-02 NOTE — Progress Notes (Signed)
Office Visit    Patient Name: [name] Date of Encounter: 03/02/2016  Primary Care Provider:  Kirk Ruths, MD Primary Cardiologist:  B. [name], MD   Chief Complaint    75 year old female with a history of CAD, peripheral arterial disease, hypertension, hyperlipidemia, and recent admission for urosepsis and CHF finding of new card of myopathy, who presents for follow-up.  Past Medical History    Past Medical History:  Diagnosis Date  . Arthritis   . CAD (coronary artery disease)    a. 02/2007 s/p PCI/DES ot LCX and RCA;  b. 07/2014 MV: no ischemia/infarct, EF 68%.  . Carotid arterial disease ([place])    a. 05/2015 Carotid U/S: RICA 1-42%, LICA 39-53%, RECA 2-02%, LECA >50% - overall stable, f/u 1 yr.  . Complication of anesthesia    Three days after procedure pt statrted to suffer with memoy loss that took about 4-5 days to come back according to [name]."  . Dilated cardiomyopathy ([place])    a. 05/2005 Echo: EF 65%; b. 01/22/2016 Echo: EF 25-30% (in setting of admission for urosepsis w/ elev trop); c. 01/26/2016 Echo: EF 35-40%, inf, distal apical, apical, and mid to distal septal HK-->felt to be 2/2 stress induced CM.  [name] Heart murmur   . History of bronchitis   . Hyperlipidemia   . Hypertensive heart disease   . Memory impairment   . MRSA (methicillin resistant staph aureus) culture positive   . Nephrolithiasis    a. 12/2015 UVJ stone w/ hydroureteronephrosis req nephrostomy tube placement.  . Osteoarthritis    a. 09/3433 s/p L TKA complicated by infections req skin grafting.  [name] PVD (peripheral vascular disease) ([place])    a. Bilat common iliac dzs, nl ABI's --> med rx.  . Sepsis ([place])    a. 12/2015 admitted with Proteus mirabilis urosepsis/bacteremia.  . Wears glasses    Past Surgical History:  Procedure Laterality Date  . ABDOMINAL HYSTERECTOMY    . APPLICATION OF A-CELL OF EXTREMITY Left 12/08/2015   Procedure: APPLICATION OF A-CELL OF knee;  Surgeon: Loel Lofty  [name], DO;  Location: [place];  Service: Plastics;  Laterality: Left;  . APPLICATION OF WOUND VAC Left 11/16/2015   Procedure: APPLICATION OF WOUND VAC;  Surgeon: Loel Lofty [name], DO;  Location: [place];  Service: Plastics;  Laterality: Left;  . APPLICATION OF WOUND VAC Left 12/08/2015   Procedure: APPLICATION OF WOUND VAC  AND CAST to knee;  Surgeon: Loel Lofty [name], DO;  Location: [place];  Service: Plastics;  Laterality: Left;  . COLONOSCOPY    . CORONARY ANGIOPLASTY  07   3 stents placed  . ESOPHAGOGASTRODUODENOSCOPY    . EYE SURGERY Bilateral    cataracts  . FREE FLAP TO EXTREMITY Left 11/16/2015   Procedure: FREE FLAP TO EXTREMITY;  Surgeon: Loel Lofty [name], DO;  Location: [place];  Service: Plastics;  Laterality: Left;  . I&D EXTREMITY Left 10/18/2015   Procedure: Left Knee Washout, Reclosure;  Surgeon: Newt Minion, MD;  Location: [place];  Service: Orthopedics;  Laterality: Left;  . I&D EXTREMITY Left 12/08/2015   Procedure: IRRIGATION AND DEBRIDEMENT knee;  Surgeon: Loel Lofty [name], DO;  Location: [place];  Service: Plastics;  Laterality: Left;  . I&D KNEE WITH POLY EXCHANGE Left 10/12/2015   Procedure: Irrigation and Debridement , Poly Exchange, Antibiotic Beads Left Knee;  Surgeon: Newt Minion, MD;  Location: [place];  Service: Orthopedics;  Laterality: Left;  . INCISION AND  DRAINAGE OF WOUND Left 01/04/2016   Procedure: DEBRIDEMENT OF LEFT KNEE WOUND WITH ACELL AND WOUND VAC PLACEMENT;  Surgeon: Loel Lofty [name], DO;  Location: [place];  Service: Plastics;  Laterality: Left;  . KNEE SURGERY     denies  . SKIN SPLIT GRAFT Left 11/16/2015   Procedure: LEFT GASTROC MUSCLE FLAP WITH SKIN GRAFT SPLIT THICKNESS AND VAC PLACEMENT;  Surgeon: Loel Lofty [name], DO;  Location: [place];  Service: Plastics;  Laterality: Left;  . TOTAL HIP ARTHROPLASTY Right 09/10/2012   Dr Sharol Given  . TOTAL HIP ARTHROPLASTY Right 09/10/2012   Procedure: TOTAL HIP ARTHROPLASTY;  Surgeon: Newt Minion, MD;   Location: [place];  Service: Orthopedics;  Laterality: Right;  Right Total Hip Arthroplasty  . TOTAL KNEE ARTHROPLASTY Left 09/28/2015   Procedure: TOTAL KNEE ARTHROPLASTY;  Surgeon: Newt Minion, MD;  Location: [place];  Service: Orthopedics;  Laterality: Left;    Allergies  No Known Allergies  History of Present Illness    76 year old female with the above complex past medical history. She has a coronary artery disease history that dates back to 2008, when she underwent drug-eluting stent placement to the circumflex and right coronary artery. Other history includes hypertension, hyperlipidemia, peripheral arterial disease with known bilateral common iliac artery stenoses, which have been treated medically. Earlier this year, she underwent left total knee arthroplasty and her postoperative course is complicated by recurrent infection requiring skin grafting. In June, she was admitted with nausea, vomiting, fever, and urosepsis, given by right hydronephrosis. She was treated aggressively with antibiotics and also required nephrostomy tube placement. Troponin was mildly elevated and echocardiogram was performed revealing an EF of 25%. She was diuresed and medically managed and follow-up echo 4 days later showed some improvement in LV function with EF 3540% with inferior, distal apical, apical, and mid to distal septal hypokinesis. It is felt that this most likely represented a stress-induced cardiomyopathy and recommendation was made for ongoing medical therapy with the plan to follow-up echocardiogram in 3 months later. In the setting of acute illness, it was not felt that an ischemic evaluation would be prudent.  After discharge from [facility], she was admitted to hospital in [facility] in early July related to hematochezia and anemia. She underwent EGD and colonoscopy and was found to have a gastric AVM. This was ablated. Colonoscopy showed ulcerated colon and rectum. She was subsequently discharged and has  since been in a skilled nursing facility. She has been working with physical therapy and has steadily regained some strength. She has lost a fair amount of weight over the past few months, dating back to March-down approximately 40 pounds. Her appetite is improving however and her by mouth intake has been much better. She has some dyspnea on exertion with physical therapy but she says it's not too bad. She thinks overall she is tolerating it well. She denies chest pain, palpitations, PND, orthopnea, dizziness, syncope, edema, or early satiety. She has not been having any significant left knee pain and is much more mobile. It is not anticipated that she will require any additional surgeries at this point.  Home Medications    Prior to Admission medications   Medication Sig Start Date End Date Taking? Authorizing Provider  doxycycline (DORYX) 100 MG EC tablet Take 100 mg by mouth 2 (two) times daily.   Yes Historical Provider, MD  budesonide-formoterol (SYMBICORT) 160-4.5 MCG/ACT inhaler Inhale 2 puffs into the lungs 2 (two) times daily.    Historical Provider,  MD  carvedilol (COREG) 3.125 MG tablet Take 1 tablet (3.125 mg total) by mouth 2 (two) times daily with a meal. 02/01/16   Ripudeep K Rai, MD  feeding supplement, ENSURE ENLIVE, (ENSURE ENLIVE) LIQD Take 237 mLs by mouth 3 (three) times daily between meals. 02/01/16   Ripudeep Krystal Eaton, MD  furosemide (LASIX) 20 MG tablet Take 1 tablet (20 mg total) by mouth daily. 02/01/16   Ripudeep Krystal Eaton, MD  Glucosamine-Chondroit-Vit C-Mn (GLUCOSAMINE-CHONDROITIN) CAPS Take 1 capsule by mouth daily.    Historical Provider, MD  magnesium oxide (MAG-OX) 400 (241.3 Mg) MG tablet Take 1 tablet (400 mg total) by mouth 2 (two) times daily. 02/01/16   Ripudeep Krystal Eaton, MD  Multiple Vitamin (MULITIVITAMIN WITH MINERALS) TABS Take 1 tablet by mouth daily.    Historical Provider, MD  polyethylene glycol (MIRALAX / GLYCOLAX) packet Take 17 g by mouth daily. Patient taking  differently: Take 17 g by mouth daily as needed for moderate constipation.  11/17/15   Shawn Montgomery Rayburn, PA-C  potassium chloride SA (K-DUR,KLOR-CON) 20 MEQ tablet Take 1 tablet (20 mEq total) by mouth daily. 02/01/16   Ripudeep Krystal Eaton, MD  traMADol (ULTRAM) 50 MG tablet Take 1 tablet (50 mg total) by mouth every 6 (six) hours as needed for moderate pain or severe pain. 02/01/16   Ripudeep Krystal Eaton, MD    Review of Systems    As above, she is slowly recovering from multiple illnesses dating back to March. She has not been having any chest pain, palpitations, PND, orthopnea, dizziness, syncopal, edema, or early satiety. She does fatigue fairly easily but is working with physical therapy and improving. Appetite is improving.  All other systems reviewed and are otherwise negative except as noted above.  Physical Exam    VS:  BP 115/72   Pulse 98   Ht '5\' 2"'$  (1.575 m)   Wt 98 lb 9.6 oz (44.7 kg)   BMI 18.03 kg/m  , BMI Body mass index is 18.03 kg/m. GEN: Well nourished, well developed, in no acute distress.  HEENT: normal.  Neck: Supple, no JVD, carotid bruits, or masses. Cardiac: RRR, no murmurs, rubs, or gallops. No clubbing, cyanosis, edema.  Radials/DP/PT 2+ and equal bilaterally.  Respiratory:  Respirations regular and unlabored, clear to auscultation bilaterally. GI: Soft, nontender, nondistended, BS + x 4. MS: no deformity or atrophy. Skin: warm and dry, no rash. Neuro:  Strength and sensation are intact. Psych: Normal affect.  Accessory Clinical Findings    ECG - Reassess rhythm with PACs, 98, no acute ST or T changes.  Assessment & Plan    1.  Chronic systolic congestive heart failure/dilated cardio myopathy: Patient was recently admitted in the setting of urosepsis and volume overload. She was found to have LV dysfunction with EF initially documented to be 25% with improvement to 35-40% just 4 days later. There was evidence of apical ballooning suggestive of a stress-induced  cardiomyopathy. At the time, recommendation was made for medical therapy given acute illness and she has been on beta blocker and low-dose Lasix. Her blood pressure has been too soft for the addition of ACE inhibitor, ARB, ARNI, or spiro. She has been doing well without any significant dyspnea or evidence of volume overload. She is euvolemic today. As previously recommended, I will arrange for a follow-up echocardiogram in approximately 2 months (3 months after event) to reassess LV function on medical therapy.  2. Coronary artery disease: Prior circumflex and RCA stenting. Negative Myoview  in 2016. As above, she was recently found to have LV dysfunction. Troponin is mildly elevated during that admission with a peak of 1.53. This was felt to be secondary to demand ischemia and stress-induced cardiomyopathy. She has not been having chest pain or significant dyspnea. She will continue to be medically managed on statin and beta blocker therapy. Her aspirin was discontinued following admission for upper GI bleed and hematochezia in the setting of gastric AVM and rectal ulcers.  3. Hypertensive heart disease: Stable on beta blocker.  4. Hyperlipidemia: Continue statin therapy.  5. Disposition: Follow-up echo in approximately one month and follow-up with Dr. [name] shortly thereafter.  Murray Hodgkins, NP 03/02/2016, 5:06 PM

## 2016-03-03 DIAGNOSIS — Z87891 Personal history of nicotine dependence: Secondary | ICD-10-CM | POA: Diagnosis not present

## 2016-03-03 DIAGNOSIS — Z96651 Presence of right artificial knee joint: Secondary | ICD-10-CM | POA: Diagnosis not present

## 2016-03-03 DIAGNOSIS — S065X0A Traumatic subdural hemorrhage without loss of consciousness, initial encounter: Secondary | ICD-10-CM | POA: Diagnosis not present

## 2016-03-03 DIAGNOSIS — I110 Hypertensive heart disease with heart failure: Secondary | ICD-10-CM | POA: Diagnosis not present

## 2016-03-03 DIAGNOSIS — S0990XA Unspecified injury of head, initial encounter: Secondary | ICD-10-CM | POA: Diagnosis not present

## 2016-03-03 DIAGNOSIS — Z79899 Other long term (current) drug therapy: Secondary | ICD-10-CM | POA: Diagnosis not present

## 2016-03-03 DIAGNOSIS — Z043 Encounter for examination and observation following other accident: Secondary | ICD-10-CM | POA: Diagnosis not present

## 2016-03-03 DIAGNOSIS — I509 Heart failure, unspecified: Secondary | ICD-10-CM | POA: Diagnosis not present

## 2016-03-04 DIAGNOSIS — S0990XA Unspecified injury of head, initial encounter: Secondary | ICD-10-CM | POA: Diagnosis not present

## 2016-03-04 DIAGNOSIS — S0990XD Unspecified injury of head, subsequent encounter: Secondary | ICD-10-CM | POA: Diagnosis not present

## 2016-03-04 DIAGNOSIS — R220 Localized swelling, mass and lump, head: Secondary | ICD-10-CM | POA: Diagnosis not present

## 2016-03-04 DIAGNOSIS — S0011XA Contusion of right eyelid and periocular area, initial encounter: Secondary | ICD-10-CM | POA: Diagnosis not present

## 2016-03-04 DIAGNOSIS — I10 Essential (primary) hypertension: Secondary | ICD-10-CM | POA: Diagnosis not present

## 2016-03-07 DIAGNOSIS — D62 Acute posthemorrhagic anemia: Secondary | ICD-10-CM | POA: Diagnosis not present

## 2016-03-07 DIAGNOSIS — K922 Gastrointestinal hemorrhage, unspecified: Secondary | ICD-10-CM | POA: Diagnosis not present

## 2016-03-07 DIAGNOSIS — K529 Noninfective gastroenteritis and colitis, unspecified: Secondary | ICD-10-CM | POA: Diagnosis not present

## 2016-03-07 DIAGNOSIS — Q2733 Arteriovenous malformation of digestive system vessel: Secondary | ICD-10-CM | POA: Diagnosis not present

## 2016-03-08 DIAGNOSIS — L89620 Pressure ulcer of left heel, unstageable: Secondary | ICD-10-CM | POA: Diagnosis not present

## 2016-03-08 DIAGNOSIS — M25511 Pain in right shoulder: Secondary | ICD-10-CM | POA: Diagnosis not present

## 2016-03-08 DIAGNOSIS — M25561 Pain in right knee: Secondary | ICD-10-CM | POA: Diagnosis not present

## 2016-03-12 DIAGNOSIS — N201 Calculus of ureter: Secondary | ICD-10-CM | POA: Diagnosis not present

## 2016-03-20 DIAGNOSIS — I10 Essential (primary) hypertension: Secondary | ICD-10-CM | POA: Diagnosis not present

## 2016-03-20 DIAGNOSIS — F0390 Unspecified dementia without behavioral disturbance: Secondary | ICD-10-CM | POA: Diagnosis not present

## 2016-03-20 DIAGNOSIS — E785 Hyperlipidemia, unspecified: Secondary | ICD-10-CM | POA: Diagnosis not present

## 2016-03-20 DIAGNOSIS — S81002D Unspecified open wound, left knee, subsequent encounter: Secondary | ICD-10-CM | POA: Diagnosis not present

## 2016-03-20 DIAGNOSIS — I2510 Atherosclerotic heart disease of native coronary artery without angina pectoris: Secondary | ICD-10-CM | POA: Diagnosis not present

## 2016-03-23 DIAGNOSIS — I5020 Unspecified systolic (congestive) heart failure: Secondary | ICD-10-CM | POA: Diagnosis not present

## 2016-03-23 DIAGNOSIS — Z471 Aftercare following joint replacement surgery: Secondary | ICD-10-CM | POA: Diagnosis not present

## 2016-03-23 DIAGNOSIS — I110 Hypertensive heart disease with heart failure: Secondary | ICD-10-CM | POA: Diagnosis not present

## 2016-03-23 DIAGNOSIS — S90822D Blister (nonthermal), left foot, subsequent encounter: Secondary | ICD-10-CM | POA: Diagnosis not present

## 2016-03-23 DIAGNOSIS — R419 Unspecified symptoms and signs involving cognitive functions and awareness: Secondary | ICD-10-CM | POA: Diagnosis not present

## 2016-03-23 DIAGNOSIS — I2510 Atherosclerotic heart disease of native coronary artery without angina pectoris: Secondary | ICD-10-CM | POA: Diagnosis not present

## 2016-03-27 ENCOUNTER — Telehealth: Payer: Self-pay | Admitting: Cardiology

## 2016-03-27 DIAGNOSIS — I5020 Unspecified systolic (congestive) heart failure: Secondary | ICD-10-CM | POA: Diagnosis not present

## 2016-03-27 DIAGNOSIS — S90822D Blister (nonthermal), left foot, subsequent encounter: Secondary | ICD-10-CM | POA: Diagnosis not present

## 2016-03-27 DIAGNOSIS — Z471 Aftercare following joint replacement surgery: Secondary | ICD-10-CM | POA: Diagnosis not present

## 2016-03-27 DIAGNOSIS — R419 Unspecified symptoms and signs involving cognitive functions and awareness: Secondary | ICD-10-CM | POA: Diagnosis not present

## 2016-03-27 DIAGNOSIS — I2510 Atherosclerotic heart disease of native coronary artery without angina pectoris: Secondary | ICD-10-CM | POA: Diagnosis not present

## 2016-03-27 DIAGNOSIS — I110 Hypertensive heart disease with heart failure: Secondary | ICD-10-CM | POA: Diagnosis not present

## 2016-03-27 NOTE — Telephone Encounter (Signed)
[name], SLP calling from [facility].  Pt currently living w family (son and daughter-inlaw) - was just discharged from [facility] on [facility]rd, eventually supposed to go home.  [name] would like to see patient 2x a week x6 weeks for assistance w/ her cognitive deficits in preparation/goal of getting patient back to her own home. She understands patient does not have a PCP and sees Dr. [name] for PCP services as well as cardiology. Will route to Dr. [name] for review/OK on home health orders.  Erin notes OK to leave msg on her machine if she does not answer.

## 2016-03-27 NOTE — Telephone Encounter (Signed)
This should come from her primary care; she needs to establish with one Kirk Ruths

## 2016-03-27 NOTE — Telephone Encounter (Signed)
Junie Panning is calling to see if she can get verbal orders for speech therapy for 2wk 6 . Please call    Thanks

## 2016-03-28 ENCOUNTER — Telehealth: Payer: Self-pay | Admitting: Cardiology

## 2016-03-28 DIAGNOSIS — Z471 Aftercare following joint replacement surgery: Secondary | ICD-10-CM | POA: Diagnosis not present

## 2016-03-28 DIAGNOSIS — R419 Unspecified symptoms and signs involving cognitive functions and awareness: Secondary | ICD-10-CM | POA: Diagnosis not present

## 2016-03-28 DIAGNOSIS — I110 Hypertensive heart disease with heart failure: Secondary | ICD-10-CM | POA: Diagnosis not present

## 2016-03-28 DIAGNOSIS — S90822D Blister (nonthermal), left foot, subsequent encounter: Secondary | ICD-10-CM | POA: Diagnosis not present

## 2016-03-28 DIAGNOSIS — I2510 Atherosclerotic heart disease of native coronary artery without angina pectoris: Secondary | ICD-10-CM | POA: Diagnosis not present

## 2016-03-28 DIAGNOSIS — I5020 Unspecified systolic (congestive) heart failure: Secondary | ICD-10-CM | POA: Diagnosis not present

## 2016-03-28 NOTE — Telephone Encounter (Signed)
LM for [name] that MD recommends patient establish w/PCP and these home health type orders come from PCP.

## 2016-03-28 NOTE — Telephone Encounter (Signed)
Returned call - goes to [place]. Left message for caller that per review of Dr. Jacalyn Lefevre notes regarding previous request for home services, would be most appropriate to defer this to PCP.

## 2016-03-28 NOTE — Telephone Encounter (Signed)
New Message:    She needs a verbal order for Home Health Occupational Therapy. This would be for 2 times a weeks for 6 weeks please.

## 2016-03-29 ENCOUNTER — Ambulatory Visit: Payer: Medicare Other | Admitting: Cardiology

## 2016-03-29 DIAGNOSIS — S90822D Blister (nonthermal), left foot, subsequent encounter: Secondary | ICD-10-CM | POA: Diagnosis not present

## 2016-03-29 DIAGNOSIS — I2510 Atherosclerotic heart disease of native coronary artery without angina pectoris: Secondary | ICD-10-CM | POA: Diagnosis not present

## 2016-03-29 DIAGNOSIS — R419 Unspecified symptoms and signs involving cognitive functions and awareness: Secondary | ICD-10-CM | POA: Diagnosis not present

## 2016-03-29 DIAGNOSIS — Z471 Aftercare following joint replacement surgery: Secondary | ICD-10-CM | POA: Diagnosis not present

## 2016-03-29 DIAGNOSIS — I5020 Unspecified systolic (congestive) heart failure: Secondary | ICD-10-CM | POA: Diagnosis not present

## 2016-03-29 DIAGNOSIS — I110 Hypertensive heart disease with heart failure: Secondary | ICD-10-CM | POA: Diagnosis not present

## 2016-03-30 DIAGNOSIS — R419 Unspecified symptoms and signs involving cognitive functions and awareness: Secondary | ICD-10-CM | POA: Diagnosis not present

## 2016-03-30 DIAGNOSIS — Z471 Aftercare following joint replacement surgery: Secondary | ICD-10-CM | POA: Diagnosis not present

## 2016-03-30 DIAGNOSIS — S90822D Blister (nonthermal), left foot, subsequent encounter: Secondary | ICD-10-CM | POA: Diagnosis not present

## 2016-03-30 DIAGNOSIS — I5020 Unspecified systolic (congestive) heart failure: Secondary | ICD-10-CM | POA: Diagnosis not present

## 2016-03-30 DIAGNOSIS — I2510 Atherosclerotic heart disease of native coronary artery without angina pectoris: Secondary | ICD-10-CM | POA: Diagnosis not present

## 2016-03-30 DIAGNOSIS — I110 Hypertensive heart disease with heart failure: Secondary | ICD-10-CM | POA: Diagnosis not present

## 2016-04-03 ENCOUNTER — Telehealth: Payer: Self-pay | Admitting: *Deleted

## 2016-04-03 DIAGNOSIS — I110 Hypertensive heart disease with heart failure: Secondary | ICD-10-CM | POA: Diagnosis not present

## 2016-04-03 DIAGNOSIS — Z471 Aftercare following joint replacement surgery: Secondary | ICD-10-CM | POA: Diagnosis not present

## 2016-04-03 DIAGNOSIS — R419 Unspecified symptoms and signs involving cognitive functions and awareness: Secondary | ICD-10-CM | POA: Diagnosis not present

## 2016-04-03 DIAGNOSIS — S90822D Blister (nonthermal), left foot, subsequent encounter: Secondary | ICD-10-CM | POA: Diagnosis not present

## 2016-04-03 DIAGNOSIS — I2510 Atherosclerotic heart disease of native coronary artery without angina pectoris: Secondary | ICD-10-CM | POA: Diagnosis not present

## 2016-04-03 DIAGNOSIS — I5020 Unspecified systolic (congestive) heart failure: Secondary | ICD-10-CM | POA: Diagnosis not present

## 2016-04-03 MED ORDER — POTASSIUM CHLORIDE CRYS ER 20 MEQ PO TBCR: 20.0000 meq | EXTENDED_RELEASE_TABLET | Freq: Every day | ORAL | 0 refills | Status: DC

## 2016-04-03 MED ORDER — FUROSEMIDE 20 MG PO TABS: 20.0000 mg | ORAL_TABLET | Freq: Every day | ORAL | 0 refills | Status: DC

## 2016-04-03 MED ORDER — CARVEDILOL 3.125 MG PO TABS: 3.1250 mg | ORAL_TABLET | Freq: Two times a day (BID) | ORAL | 0 refills | Status: DC

## 2016-04-03 NOTE — Telephone Encounter (Signed)
Filled medications

## 2016-04-04 DIAGNOSIS — R419 Unspecified symptoms and signs involving cognitive functions and awareness: Secondary | ICD-10-CM | POA: Diagnosis not present

## 2016-04-04 DIAGNOSIS — I5020 Unspecified systolic (congestive) heart failure: Secondary | ICD-10-CM | POA: Diagnosis not present

## 2016-04-04 DIAGNOSIS — I2510 Atherosclerotic heart disease of native coronary artery without angina pectoris: Secondary | ICD-10-CM | POA: Diagnosis not present

## 2016-04-04 DIAGNOSIS — Z471 Aftercare following joint replacement surgery: Secondary | ICD-10-CM | POA: Diagnosis not present

## 2016-04-04 DIAGNOSIS — S90822D Blister (nonthermal), left foot, subsequent encounter: Secondary | ICD-10-CM | POA: Diagnosis not present

## 2016-04-04 DIAGNOSIS — I110 Hypertensive heart disease with heart failure: Secondary | ICD-10-CM | POA: Diagnosis not present

## 2016-04-05 DIAGNOSIS — I2510 Atherosclerotic heart disease of native coronary artery without angina pectoris: Secondary | ICD-10-CM | POA: Diagnosis not present

## 2016-04-05 DIAGNOSIS — S90822D Blister (nonthermal), left foot, subsequent encounter: Secondary | ICD-10-CM | POA: Diagnosis not present

## 2016-04-05 DIAGNOSIS — I110 Hypertensive heart disease with heart failure: Secondary | ICD-10-CM | POA: Diagnosis not present

## 2016-04-05 DIAGNOSIS — I5020 Unspecified systolic (congestive) heart failure: Secondary | ICD-10-CM | POA: Diagnosis not present

## 2016-04-05 DIAGNOSIS — R419 Unspecified symptoms and signs involving cognitive functions and awareness: Secondary | ICD-10-CM | POA: Diagnosis not present

## 2016-04-05 DIAGNOSIS — Z471 Aftercare following joint replacement surgery: Secondary | ICD-10-CM | POA: Diagnosis not present

## 2016-04-06 DIAGNOSIS — I2510 Atherosclerotic heart disease of native coronary artery without angina pectoris: Secondary | ICD-10-CM | POA: Diagnosis not present

## 2016-04-06 DIAGNOSIS — S90822D Blister (nonthermal), left foot, subsequent encounter: Secondary | ICD-10-CM | POA: Diagnosis not present

## 2016-04-06 DIAGNOSIS — I110 Hypertensive heart disease with heart failure: Secondary | ICD-10-CM | POA: Diagnosis not present

## 2016-04-06 DIAGNOSIS — R419 Unspecified symptoms and signs involving cognitive functions and awareness: Secondary | ICD-10-CM | POA: Diagnosis not present

## 2016-04-06 DIAGNOSIS — Z471 Aftercare following joint replacement surgery: Secondary | ICD-10-CM | POA: Diagnosis not present

## 2016-04-06 DIAGNOSIS — I5020 Unspecified systolic (congestive) heart failure: Secondary | ICD-10-CM | POA: Diagnosis not present

## 2016-04-09 DIAGNOSIS — I2510 Atherosclerotic heart disease of native coronary artery without angina pectoris: Secondary | ICD-10-CM | POA: Diagnosis not present

## 2016-04-09 DIAGNOSIS — I5020 Unspecified systolic (congestive) heart failure: Secondary | ICD-10-CM | POA: Diagnosis not present

## 2016-04-09 DIAGNOSIS — Z471 Aftercare following joint replacement surgery: Secondary | ICD-10-CM | POA: Diagnosis not present

## 2016-04-09 DIAGNOSIS — I110 Hypertensive heart disease with heart failure: Secondary | ICD-10-CM | POA: Diagnosis not present

## 2016-04-09 DIAGNOSIS — R419 Unspecified symptoms and signs involving cognitive functions and awareness: Secondary | ICD-10-CM | POA: Diagnosis not present

## 2016-04-09 DIAGNOSIS — S90822D Blister (nonthermal), left foot, subsequent encounter: Secondary | ICD-10-CM | POA: Diagnosis not present

## 2016-04-10 DIAGNOSIS — R419 Unspecified symptoms and signs involving cognitive functions and awareness: Secondary | ICD-10-CM | POA: Diagnosis not present

## 2016-04-10 DIAGNOSIS — I5020 Unspecified systolic (congestive) heart failure: Secondary | ICD-10-CM | POA: Diagnosis not present

## 2016-04-10 DIAGNOSIS — I2510 Atherosclerotic heart disease of native coronary artery without angina pectoris: Secondary | ICD-10-CM | POA: Diagnosis not present

## 2016-04-10 DIAGNOSIS — I110 Hypertensive heart disease with heart failure: Secondary | ICD-10-CM | POA: Diagnosis not present

## 2016-04-10 DIAGNOSIS — Z471 Aftercare following joint replacement surgery: Secondary | ICD-10-CM | POA: Diagnosis not present

## 2016-04-10 DIAGNOSIS — S90822D Blister (nonthermal), left foot, subsequent encounter: Secondary | ICD-10-CM | POA: Diagnosis not present

## 2016-04-11 DIAGNOSIS — M75121 Complete rotator cuff tear or rupture of right shoulder, not specified as traumatic: Secondary | ICD-10-CM | POA: Diagnosis not present

## 2016-04-11 DIAGNOSIS — M25562 Pain in left knee: Secondary | ICD-10-CM | POA: Diagnosis not present

## 2016-04-11 DIAGNOSIS — M25561 Pain in right knee: Secondary | ICD-10-CM | POA: Diagnosis not present

## 2016-04-12 DIAGNOSIS — Z471 Aftercare following joint replacement surgery: Secondary | ICD-10-CM | POA: Diagnosis not present

## 2016-04-12 DIAGNOSIS — I110 Hypertensive heart disease with heart failure: Secondary | ICD-10-CM | POA: Diagnosis not present

## 2016-04-12 DIAGNOSIS — I2510 Atherosclerotic heart disease of native coronary artery without angina pectoris: Secondary | ICD-10-CM | POA: Diagnosis not present

## 2016-04-12 DIAGNOSIS — R419 Unspecified symptoms and signs involving cognitive functions and awareness: Secondary | ICD-10-CM | POA: Diagnosis not present

## 2016-04-12 DIAGNOSIS — I5020 Unspecified systolic (congestive) heart failure: Secondary | ICD-10-CM | POA: Diagnosis not present

## 2016-04-12 DIAGNOSIS — S90822D Blister (nonthermal), left foot, subsequent encounter: Secondary | ICD-10-CM | POA: Diagnosis not present

## 2016-04-13 ENCOUNTER — Telehealth: Payer: Self-pay | Admitting: Cardiology

## 2016-04-13 DIAGNOSIS — I5020 Unspecified systolic (congestive) heart failure: Secondary | ICD-10-CM | POA: Diagnosis not present

## 2016-04-13 DIAGNOSIS — S90822D Blister (nonthermal), left foot, subsequent encounter: Secondary | ICD-10-CM | POA: Diagnosis not present

## 2016-04-13 DIAGNOSIS — R419 Unspecified symptoms and signs involving cognitive functions and awareness: Secondary | ICD-10-CM | POA: Diagnosis not present

## 2016-04-13 DIAGNOSIS — I2510 Atherosclerotic heart disease of native coronary artery without angina pectoris: Secondary | ICD-10-CM | POA: Diagnosis not present

## 2016-04-13 DIAGNOSIS — Z471 Aftercare following joint replacement surgery: Secondary | ICD-10-CM | POA: Diagnosis not present

## 2016-04-13 DIAGNOSIS — I110 Hypertensive heart disease with heart failure: Secondary | ICD-10-CM | POA: Diagnosis not present

## 2016-04-13 MED ORDER — LISINOPRIL 5 MG PO TABS: 5.0000 mg | ORAL_TABLET | Freq: Every day | ORAL | 12 refills | Status: DC

## 2016-04-13 NOTE — Telephone Encounter (Signed)
Spoke with Claiborne Billings RN with home health States blood pressures have been trending up States systolic has been running in the upper 160's, today 160/78. Heart rates mid 60's-80's Patient currently taking Carvedilol 3.125 mg twice a day and Furosemide 20 mg daily. She had been on Clonidine 0.1 mg three times a day but was d/c secondary to hypotension Will forward to Dr [name] for review

## 2016-04-13 NOTE — Telephone Encounter (Signed)
Lisinopril 5 mg daily; bmet one week Kirk Ruths

## 2016-04-13 NOTE — Telephone Encounter (Signed)
New Message  [name] at home nurse voiced she was calling to report pts Bp's.  [name] at home nurse voiced pt is not having CP nor SOB.  Please f/u with pt.

## 2016-04-13 NOTE — Telephone Encounter (Signed)
Left detailed message for sherri of dr Jacalyn Lefevre recommendations. Left message for pt to call  New script sent to the pharmacy

## 2016-04-16 DIAGNOSIS — I5020 Unspecified systolic (congestive) heart failure: Secondary | ICD-10-CM | POA: Diagnosis not present

## 2016-04-16 DIAGNOSIS — S90822D Blister (nonthermal), left foot, subsequent encounter: Secondary | ICD-10-CM | POA: Diagnosis not present

## 2016-04-16 DIAGNOSIS — R419 Unspecified symptoms and signs involving cognitive functions and awareness: Secondary | ICD-10-CM | POA: Diagnosis not present

## 2016-04-16 DIAGNOSIS — I110 Hypertensive heart disease with heart failure: Secondary | ICD-10-CM | POA: Diagnosis not present

## 2016-04-16 DIAGNOSIS — Z471 Aftercare following joint replacement surgery: Secondary | ICD-10-CM | POA: Diagnosis not present

## 2016-04-16 DIAGNOSIS — I2510 Atherosclerotic heart disease of native coronary artery without angina pectoris: Secondary | ICD-10-CM | POA: Diagnosis not present

## 2016-04-16 NOTE — Telephone Encounter (Signed)
Spoke with pt dtr, Aware of dr crenshaw's recommendations.  

## 2016-04-17 ENCOUNTER — Ambulatory Visit (HOSPITAL_COMMUNITY): Payer: Medicare Other | Attending: Cardiology

## 2016-04-17 ENCOUNTER — Other Ambulatory Visit: Payer: Self-pay

## 2016-04-17 DIAGNOSIS — I517 Cardiomegaly: Secondary | ICD-10-CM | POA: Insufficient documentation

## 2016-04-17 DIAGNOSIS — I5189 Other ill-defined heart diseases: Secondary | ICD-10-CM | POA: Insufficient documentation

## 2016-04-17 DIAGNOSIS — S90822D Blister (nonthermal), left foot, subsequent encounter: Secondary | ICD-10-CM | POA: Diagnosis not present

## 2016-04-17 DIAGNOSIS — I2510 Atherosclerotic heart disease of native coronary artery without angina pectoris: Secondary | ICD-10-CM | POA: Diagnosis not present

## 2016-04-17 DIAGNOSIS — I059 Rheumatic mitral valve disease, unspecified: Secondary | ICD-10-CM | POA: Diagnosis not present

## 2016-04-17 DIAGNOSIS — I5020 Unspecified systolic (congestive) heart failure: Secondary | ICD-10-CM | POA: Diagnosis not present

## 2016-04-17 DIAGNOSIS — I351 Nonrheumatic aortic (valve) insufficiency: Secondary | ICD-10-CM | POA: Diagnosis not present

## 2016-04-17 DIAGNOSIS — I5022 Chronic systolic (congestive) heart failure: Secondary | ICD-10-CM | POA: Insufficient documentation

## 2016-04-17 DIAGNOSIS — I509 Heart failure, unspecified: Secondary | ICD-10-CM | POA: Diagnosis present

## 2016-04-17 DIAGNOSIS — I110 Hypertensive heart disease with heart failure: Secondary | ICD-10-CM | POA: Diagnosis not present

## 2016-04-17 DIAGNOSIS — R419 Unspecified symptoms and signs involving cognitive functions and awareness: Secondary | ICD-10-CM | POA: Diagnosis not present

## 2016-04-17 DIAGNOSIS — Z471 Aftercare following joint replacement surgery: Secondary | ICD-10-CM | POA: Diagnosis not present

## 2016-04-18 DIAGNOSIS — I110 Hypertensive heart disease with heart failure: Secondary | ICD-10-CM | POA: Diagnosis not present

## 2016-04-18 DIAGNOSIS — I2510 Atherosclerotic heart disease of native coronary artery without angina pectoris: Secondary | ICD-10-CM | POA: Diagnosis not present

## 2016-04-18 DIAGNOSIS — I5020 Unspecified systolic (congestive) heart failure: Secondary | ICD-10-CM | POA: Diagnosis not present

## 2016-04-18 DIAGNOSIS — S90822D Blister (nonthermal), left foot, subsequent encounter: Secondary | ICD-10-CM | POA: Diagnosis not present

## 2016-04-18 DIAGNOSIS — R419 Unspecified symptoms and signs involving cognitive functions and awareness: Secondary | ICD-10-CM | POA: Diagnosis not present

## 2016-04-18 DIAGNOSIS — Z471 Aftercare following joint replacement surgery: Secondary | ICD-10-CM | POA: Diagnosis not present

## 2016-04-19 DIAGNOSIS — I110 Hypertensive heart disease with heart failure: Secondary | ICD-10-CM | POA: Diagnosis not present

## 2016-04-19 DIAGNOSIS — S90822D Blister (nonthermal), left foot, subsequent encounter: Secondary | ICD-10-CM | POA: Diagnosis not present

## 2016-04-19 DIAGNOSIS — R419 Unspecified symptoms and signs involving cognitive functions and awareness: Secondary | ICD-10-CM | POA: Diagnosis not present

## 2016-04-19 DIAGNOSIS — I5020 Unspecified systolic (congestive) heart failure: Secondary | ICD-10-CM | POA: Diagnosis not present

## 2016-04-19 DIAGNOSIS — Z471 Aftercare following joint replacement surgery: Secondary | ICD-10-CM | POA: Diagnosis not present

## 2016-04-19 DIAGNOSIS — I2510 Atherosclerotic heart disease of native coronary artery without angina pectoris: Secondary | ICD-10-CM | POA: Diagnosis not present

## 2016-04-20 ENCOUNTER — Telehealth: Payer: Self-pay | Admitting: Cardiology

## 2016-04-20 DIAGNOSIS — Z23 Encounter for immunization: Secondary | ICD-10-CM | POA: Diagnosis not present

## 2016-04-20 NOTE — Telephone Encounter (Signed)
Pt's daughter in-law called the office back for pt's test results  Please give her a call back.

## 2016-04-20 NOTE — Progress Notes (Signed)
HPI: FU coronary artery disease. She is status post PCI of her circumflex and right coronary artery with drug-eluting stents in August 2008. Abdominal ultrasound in January 2014 showed moderate right and severe left common iliac stenosis. ABIs in January of 2014 were normal. Patient seen by Dr Fletcher Anon and medical therapy recommended for now. Nuclear study 1/16 showed EF 68 and normal perfusion. Carotid dopplers 11/16 showed 40-59 left and 1-39 right stenosis. Echo 9/17 showed normal LV function, grade 1 DD, mild AI and mild LAE. Pt had probable stress induced CM 6/17 in setting of urosepsis. Also with GI bleed 7/17 related to gastic AVM; ablated. Since I last saw her, the patient denies any dyspnea on exertion, orthopnea, PND, pedal edema, palpitations, syncope or chest pain.   Current Outpatient Prescriptions  Medication Sig Dispense Refill  . acidophilus (RISAQUAD) CAPS capsule Take 1 capsule by mouth daily.    [name] aspirin EC 81 MG tablet Take 81 mg by mouth daily.    [name] atorvastatin (LIPITOR) 40 MG tablet Take 40 mg by mouth daily.    . budesonide-formoterol (SYMBICORT) 160-4.5 MCG/ACT inhaler Inhale 2 puffs into the lungs 2 (two) times daily.    . carvedilol (COREG) 3.125 MG tablet Take 1 tablet (3.125 mg total) by mouth 2 (two) times daily with a meal. 60 tablet 0  . feeding supplement, ENSURE ENLIVE, (ENSURE ENLIVE) LIQD Take 237 mLs by mouth 3 (three) times daily between meals. 237 mL 12  . furosemide (LASIX) 20 MG tablet Take 1 tablet (20 mg total) by mouth daily. 30 tablet 0  . lisinopril (PRINIVIL,ZESTRIL) 5 MG tablet Take 1 tablet (5 mg total) by mouth daily. 30 tablet 12  . magnesium oxide (MAG-OX) 400 (241.3 Mg) MG tablet Take 1 tablet (400 mg total) by mouth 2 (two) times daily.    . mesalamine (LIALDA) 1.2 g EC tablet Take 2.4 g by mouth daily with breakfast.    . Multiple Vitamin (MULITIVITAMIN WITH MINERALS) TABS Take 1 tablet by mouth daily.    . pantoprazole (PROTONIX) 40 MG  tablet Take 40 mg by mouth daily.    . potassium chloride SA (K-DUR,KLOR-CON) 20 MEQ tablet Take 1 tablet (20 mEq total) by mouth daily. 30 tablet 0   No current facility-administered medications for this visit.      Past Medical History:  Diagnosis Date  . Arthritis   . CAD (coronary artery disease)    a. 02/2007 s/p PCI/DES ot LCX and RCA;  b. 07/2014 MV: no ischemia/infarct, EF 68%.  . Carotid arterial disease ([place])    a. 05/2015 Carotid U/S: RICA 2-77%, LICA 41-28%, RECA 7-86%, LECA >50% - overall stable, f/u 1 yr.  . Complication of anesthesia    Three days after procedure pt statrted to suffer with memoy loss that took about 4-5 days to come back according to [name]."  . Dilated cardiomyopathy ([place])    a. 05/2005 Echo: EF 65%; b. 01/22/2016 Echo: EF 25-30% (in setting of admission for urosepsis w/ elev trop); c. 01/26/2016 Echo: EF 35-40%, inf, distal apical, apical, and mid to distal septal HK-->felt to be 2/2 stress induced CM.  [name] Heart murmur   . History of bronchitis   . Hyperlipidemia   . Hypertensive heart disease   . Memory impairment   . MRSA (methicillin resistant staph aureus) culture positive   . Nephrolithiasis    a. 12/2015 UVJ stone w/ hydroureteronephrosis req nephrostomy tube placement.  . Osteoarthritis  a. 12/628 s/p L TKA complicated by infections req skin grafting.  [name] PVD (peripheral vascular disease) ([place])    a. Bilat common iliac dzs, nl ABI's --> med rx.  . Sepsis ([place])    a. 12/2015 admitted with Proteus mirabilis urosepsis/bacteremia.  . Wears glasses     Past Surgical History:  Procedure Laterality Date  . ABDOMINAL HYSTERECTOMY    . APPLICATION OF A-CELL OF EXTREMITY Left 12/08/2015   Procedure: APPLICATION OF A-CELL OF knee;  Surgeon: Loel Lofty [name], DO;  Location: [place];  Service: Plastics;  Laterality: Left;  . APPLICATION OF WOUND VAC Left 11/16/2015   Procedure: APPLICATION OF WOUND VAC;  Surgeon: Loel Lofty [name], DO;  Location:  [place];  Service: Plastics;  Laterality: Left;  . APPLICATION OF WOUND VAC Left 12/08/2015   Procedure: APPLICATION OF WOUND VAC  AND CAST to knee;  Surgeon: Loel Lofty [name], DO;  Location: [place];  Service: Plastics;  Laterality: Left;  . COLONOSCOPY    . CORONARY ANGIOPLASTY  07   3 stents placed  . ESOPHAGOGASTRODUODENOSCOPY    . EYE SURGERY Bilateral    cataracts  . FREE FLAP TO EXTREMITY Left 11/16/2015   Procedure: FREE FLAP TO EXTREMITY;  Surgeon: Loel Lofty [name], DO;  Location: [place];  Service: Plastics;  Laterality: Left;  . I&D EXTREMITY Left 10/18/2015   Procedure: Left Knee Washout, Reclosure;  Surgeon: Newt Minion, MD;  Location: [place];  Service: Orthopedics;  Laterality: Left;  . I&D EXTREMITY Left 12/08/2015   Procedure: IRRIGATION AND DEBRIDEMENT knee;  Surgeon: Loel Lofty [name], DO;  Location: [place];  Service: Plastics;  Laterality: Left;  . I&D KNEE WITH POLY EXCHANGE Left 10/12/2015   Procedure: Irrigation and Debridement , Poly Exchange, Antibiotic Beads Left Knee;  Surgeon: Newt Minion, MD;  Location: [place];  Service: Orthopedics;  Laterality: Left;  . INCISION AND DRAINAGE OF WOUND Left 01/04/2016   Procedure: DEBRIDEMENT OF LEFT KNEE WOUND WITH ACELL AND WOUND VAC PLACEMENT;  Surgeon: Loel Lofty [name], DO;  Location: [place];  Service: Plastics;  Laterality: Left;  . KNEE SURGERY     denies  . SKIN SPLIT GRAFT Left 11/16/2015   Procedure: LEFT GASTROC MUSCLE FLAP WITH SKIN GRAFT SPLIT THICKNESS AND VAC PLACEMENT;  Surgeon: Loel Lofty [name], DO;  Location: [place];  Service: Plastics;  Laterality: Left;  . TOTAL HIP ARTHROPLASTY Right 09/10/2012   Dr Sharol Given  . TOTAL HIP ARTHROPLASTY Right 09/10/2012   Procedure: TOTAL HIP ARTHROPLASTY;  Surgeon: Newt Minion, MD;  Location: [place];  Service: Orthopedics;  Laterality: Right;  Right Total Hip Arthroplasty  . TOTAL KNEE ARTHROPLASTY Left 09/28/2015   Procedure: TOTAL KNEE ARTHROPLASTY;  Surgeon: Newt Minion, MD;   Location: [place];  Service: Orthopedics;  Laterality: Left;    Social History   Social History  . Marital status: Single    Spouse name: N/A  . Number of children: N/A  . Years of education: N/A   Occupational History  . Retired    Social History Main Topics  . Smoking status: Former Smoker    Packs/day: 0.50    Years: 50.00    Types: Cigarettes    Quit date: 09/28/2015  . Smokeless tobacco: Never Used     Comment: quit 09/27/15  . Alcohol use No  . Drug use: No  . Sexual activity: No   Other Topics Concern  . Not on file   Social History  Narrative   She has been living with her daughter when not in rehab since the surgery in March.    Family History  Problem Relation Age of Onset  . Breast cancer Mother   . Leukemia Father   . Stroke Sister   . Bone cancer      ROS: no fevers or chills, productive cough, hemoptysis, dysphasia, odynophagia, melena, hematochezia, dysuria, hematuria, rash, seizure activity, orthopnea, PND, pedal edema, claudication. Remaining systems are negative.  Physical Exam: Well-developed well-nourished in no acute distress.  Skin is warm and dry.  HEENT is normal.  Neck is supple.  Chest is clear to auscultation with normal expansion.  Cardiovascular exam is regular rate and rhythm.  Abdominal exam nontender or distended. No masses palpated. Extremities show no edema. neuro grossly intact  ECG  A/P  1 Hypertension-blood pressure controlled. Continue present medications.  2 coronary artery disease-continue aspirin and statin.  3 carotid artery disease-continue aspirin and statin. Schedule follow-up carotid Dopplers.  4 Vascular disease-no claudication. Continue aspirin and statin.  5 hyperlipidemia-continue statin.  6 stress induced cardiomyopathy-improved on most recent echocardiogram. Continue present medications.  7 tobacco abuse-patient has discontinued.  Kirk Ruths, MD

## 2016-04-20 NOTE — Telephone Encounter (Signed)
Advised daughter-in-law  Notes Recorded by Rogelia Mire, NP on 04/19/2016 at 8:19 AM EDT LV function (heart pumping function) has normalized. This is great news. Continue current doses of carvedilol and lisinopril as previously Rx.

## 2016-04-23 ENCOUNTER — Encounter: Payer: Self-pay | Admitting: Cardiology

## 2016-04-23 ENCOUNTER — Ambulatory Visit (INDEPENDENT_AMBULATORY_CARE_PROVIDER_SITE_OTHER): Payer: Medicare Other | Admitting: Cardiology

## 2016-04-23 VITALS — BP 112/80 | HR 86 | Ht 62.5 in | Wt 106.4 lb

## 2016-04-23 DIAGNOSIS — I10 Essential (primary) hypertension: Secondary | ICD-10-CM | POA: Diagnosis not present

## 2016-04-23 DIAGNOSIS — I679 Cerebrovascular disease, unspecified: Secondary | ICD-10-CM

## 2016-04-23 DIAGNOSIS — Z471 Aftercare following joint replacement surgery: Secondary | ICD-10-CM | POA: Diagnosis not present

## 2016-04-23 DIAGNOSIS — I2510 Atherosclerotic heart disease of native coronary artery without angina pectoris: Secondary | ICD-10-CM

## 2016-04-23 DIAGNOSIS — E785 Hyperlipidemia, unspecified: Secondary | ICD-10-CM

## 2016-04-23 DIAGNOSIS — S90822D Blister (nonthermal), left foot, subsequent encounter: Secondary | ICD-10-CM | POA: Diagnosis not present

## 2016-04-23 DIAGNOSIS — R419 Unspecified symptoms and signs involving cognitive functions and awareness: Secondary | ICD-10-CM | POA: Diagnosis not present

## 2016-04-23 DIAGNOSIS — I5020 Unspecified systolic (congestive) heart failure: Secondary | ICD-10-CM | POA: Diagnosis not present

## 2016-04-23 DIAGNOSIS — I110 Hypertensive heart disease with heart failure: Secondary | ICD-10-CM | POA: Diagnosis not present

## 2016-04-23 NOTE — Patient Instructions (Signed)
Medication Instructions:   NO CHANGE  Testing/Procedures:  Your physician has requested that you have a carotid duplex. This test is an ultrasound of the carotid arteries in your neck. It looks at blood flow through these arteries that supply the brain with blood. Allow one hour for this exam. There are no restrictions or special instructions.    Follow-Up:  Your physician wants you to follow-up in: [place] will receive a reminder letter in the mail two months in advance. If you don't receive a letter, please call our office to schedule the follow-up appointment.   If you need a refill on your cardiac medications before your next appointment, please call your pharmacy.

## 2016-04-24 DIAGNOSIS — R419 Unspecified symptoms and signs involving cognitive functions and awareness: Secondary | ICD-10-CM | POA: Diagnosis not present

## 2016-04-24 DIAGNOSIS — I110 Hypertensive heart disease with heart failure: Secondary | ICD-10-CM | POA: Diagnosis not present

## 2016-04-24 DIAGNOSIS — I5020 Unspecified systolic (congestive) heart failure: Secondary | ICD-10-CM | POA: Diagnosis not present

## 2016-04-24 DIAGNOSIS — I2510 Atherosclerotic heart disease of native coronary artery without angina pectoris: Secondary | ICD-10-CM | POA: Diagnosis not present

## 2016-04-24 DIAGNOSIS — S90822D Blister (nonthermal), left foot, subsequent encounter: Secondary | ICD-10-CM | POA: Diagnosis not present

## 2016-04-24 DIAGNOSIS — Z471 Aftercare following joint replacement surgery: Secondary | ICD-10-CM | POA: Diagnosis not present

## 2016-04-25 DIAGNOSIS — I110 Hypertensive heart disease with heart failure: Secondary | ICD-10-CM | POA: Diagnosis not present

## 2016-04-25 DIAGNOSIS — I2510 Atherosclerotic heart disease of native coronary artery without angina pectoris: Secondary | ICD-10-CM | POA: Diagnosis not present

## 2016-04-25 DIAGNOSIS — S90822D Blister (nonthermal), left foot, subsequent encounter: Secondary | ICD-10-CM | POA: Diagnosis not present

## 2016-04-25 DIAGNOSIS — Z471 Aftercare following joint replacement surgery: Secondary | ICD-10-CM | POA: Diagnosis not present

## 2016-04-25 DIAGNOSIS — R419 Unspecified symptoms and signs involving cognitive functions and awareness: Secondary | ICD-10-CM | POA: Diagnosis not present

## 2016-04-25 DIAGNOSIS — I5020 Unspecified systolic (congestive) heart failure: Secondary | ICD-10-CM | POA: Diagnosis not present

## 2016-04-26 DIAGNOSIS — Z471 Aftercare following joint replacement surgery: Secondary | ICD-10-CM | POA: Diagnosis not present

## 2016-04-26 DIAGNOSIS — R419 Unspecified symptoms and signs involving cognitive functions and awareness: Secondary | ICD-10-CM | POA: Diagnosis not present

## 2016-04-26 DIAGNOSIS — S90822D Blister (nonthermal), left foot, subsequent encounter: Secondary | ICD-10-CM | POA: Diagnosis not present

## 2016-04-26 DIAGNOSIS — I110 Hypertensive heart disease with heart failure: Secondary | ICD-10-CM | POA: Diagnosis not present

## 2016-04-26 DIAGNOSIS — I5020 Unspecified systolic (congestive) heart failure: Secondary | ICD-10-CM | POA: Diagnosis not present

## 2016-04-26 DIAGNOSIS — I2510 Atherosclerotic heart disease of native coronary artery without angina pectoris: Secondary | ICD-10-CM | POA: Diagnosis not present

## 2016-05-01 DIAGNOSIS — Z471 Aftercare following joint replacement surgery: Secondary | ICD-10-CM | POA: Diagnosis not present

## 2016-05-01 DIAGNOSIS — I110 Hypertensive heart disease with heart failure: Secondary | ICD-10-CM | POA: Diagnosis not present

## 2016-05-01 DIAGNOSIS — R419 Unspecified symptoms and signs involving cognitive functions and awareness: Secondary | ICD-10-CM | POA: Diagnosis not present

## 2016-05-01 DIAGNOSIS — I2510 Atherosclerotic heart disease of native coronary artery without angina pectoris: Secondary | ICD-10-CM | POA: Diagnosis not present

## 2016-05-01 DIAGNOSIS — S90822D Blister (nonthermal), left foot, subsequent encounter: Secondary | ICD-10-CM | POA: Diagnosis not present

## 2016-05-01 DIAGNOSIS — I5020 Unspecified systolic (congestive) heart failure: Secondary | ICD-10-CM | POA: Diagnosis not present

## 2016-05-02 DIAGNOSIS — R419 Unspecified symptoms and signs involving cognitive functions and awareness: Secondary | ICD-10-CM | POA: Diagnosis not present

## 2016-05-02 DIAGNOSIS — I110 Hypertensive heart disease with heart failure: Secondary | ICD-10-CM | POA: Diagnosis not present

## 2016-05-02 DIAGNOSIS — S90822D Blister (nonthermal), left foot, subsequent encounter: Secondary | ICD-10-CM | POA: Diagnosis not present

## 2016-05-02 DIAGNOSIS — Z471 Aftercare following joint replacement surgery: Secondary | ICD-10-CM | POA: Diagnosis not present

## 2016-05-02 DIAGNOSIS — I2510 Atherosclerotic heart disease of native coronary artery without angina pectoris: Secondary | ICD-10-CM | POA: Diagnosis not present

## 2016-05-02 DIAGNOSIS — I5020 Unspecified systolic (congestive) heart failure: Secondary | ICD-10-CM | POA: Diagnosis not present

## 2016-05-03 ENCOUNTER — Other Ambulatory Visit: Payer: Self-pay

## 2016-05-03 ENCOUNTER — Other Ambulatory Visit: Payer: Self-pay | Admitting: Cardiology

## 2016-05-03 DIAGNOSIS — R419 Unspecified symptoms and signs involving cognitive functions and awareness: Secondary | ICD-10-CM | POA: Diagnosis not present

## 2016-05-03 DIAGNOSIS — I2510 Atherosclerotic heart disease of native coronary artery without angina pectoris: Secondary | ICD-10-CM | POA: Diagnosis not present

## 2016-05-03 DIAGNOSIS — S90822D Blister (nonthermal), left foot, subsequent encounter: Secondary | ICD-10-CM | POA: Diagnosis not present

## 2016-05-03 DIAGNOSIS — I5020 Unspecified systolic (congestive) heart failure: Secondary | ICD-10-CM | POA: Diagnosis not present

## 2016-05-03 DIAGNOSIS — I110 Hypertensive heart disease with heart failure: Secondary | ICD-10-CM | POA: Diagnosis not present

## 2016-05-03 DIAGNOSIS — Z471 Aftercare following joint replacement surgery: Secondary | ICD-10-CM | POA: Diagnosis not present

## 2016-05-03 MED ORDER — FUROSEMIDE 20 MG PO TABS: 20.0000 mg | ORAL_TABLET | Freq: Every day | ORAL | 6 refills | Status: DC

## 2016-05-03 MED ORDER — POTASSIUM CHLORIDE CRYS ER 20 MEQ PO TBCR: 20.0000 meq | EXTENDED_RELEASE_TABLET | Freq: Every day | ORAL | 6 refills | Status: DC

## 2016-05-03 MED ORDER — CARVEDILOL 3.125 MG PO TABS: 3.1250 mg | ORAL_TABLET | Freq: Two times a day (BID) | ORAL | 11 refills | Status: DC

## 2016-05-04 DIAGNOSIS — R419 Unspecified symptoms and signs involving cognitive functions and awareness: Secondary | ICD-10-CM | POA: Diagnosis not present

## 2016-05-04 DIAGNOSIS — I5020 Unspecified systolic (congestive) heart failure: Secondary | ICD-10-CM | POA: Diagnosis not present

## 2016-05-04 DIAGNOSIS — S90822D Blister (nonthermal), left foot, subsequent encounter: Secondary | ICD-10-CM | POA: Diagnosis not present

## 2016-05-04 DIAGNOSIS — Z471 Aftercare following joint replacement surgery: Secondary | ICD-10-CM | POA: Diagnosis not present

## 2016-05-04 DIAGNOSIS — I2510 Atherosclerotic heart disease of native coronary artery without angina pectoris: Secondary | ICD-10-CM | POA: Diagnosis not present

## 2016-05-04 DIAGNOSIS — I110 Hypertensive heart disease with heart failure: Secondary | ICD-10-CM | POA: Diagnosis not present

## 2016-05-07 DIAGNOSIS — I110 Hypertensive heart disease with heart failure: Secondary | ICD-10-CM | POA: Diagnosis not present

## 2016-05-07 DIAGNOSIS — Z471 Aftercare following joint replacement surgery: Secondary | ICD-10-CM | POA: Diagnosis not present

## 2016-05-07 DIAGNOSIS — R419 Unspecified symptoms and signs involving cognitive functions and awareness: Secondary | ICD-10-CM | POA: Diagnosis not present

## 2016-05-07 DIAGNOSIS — S90822D Blister (nonthermal), left foot, subsequent encounter: Secondary | ICD-10-CM | POA: Diagnosis not present

## 2016-05-07 DIAGNOSIS — I2510 Atherosclerotic heart disease of native coronary artery without angina pectoris: Secondary | ICD-10-CM | POA: Diagnosis not present

## 2016-05-07 DIAGNOSIS — I5020 Unspecified systolic (congestive) heart failure: Secondary | ICD-10-CM | POA: Diagnosis not present

## 2016-05-08 DIAGNOSIS — I5020 Unspecified systolic (congestive) heart failure: Secondary | ICD-10-CM | POA: Diagnosis not present

## 2016-05-08 DIAGNOSIS — I110 Hypertensive heart disease with heart failure: Secondary | ICD-10-CM | POA: Diagnosis not present

## 2016-05-08 DIAGNOSIS — I2510 Atherosclerotic heart disease of native coronary artery without angina pectoris: Secondary | ICD-10-CM | POA: Diagnosis not present

## 2016-05-08 DIAGNOSIS — S90822D Blister (nonthermal), left foot, subsequent encounter: Secondary | ICD-10-CM | POA: Diagnosis not present

## 2016-05-08 DIAGNOSIS — Z471 Aftercare following joint replacement surgery: Secondary | ICD-10-CM | POA: Diagnosis not present

## 2016-05-08 DIAGNOSIS — R419 Unspecified symptoms and signs involving cognitive functions and awareness: Secondary | ICD-10-CM | POA: Diagnosis not present

## 2016-05-09 ENCOUNTER — Other Ambulatory Visit: Payer: Self-pay | Admitting: Cardiology

## 2016-05-09 ENCOUNTER — Ambulatory Visit (INDEPENDENT_AMBULATORY_CARE_PROVIDER_SITE_OTHER): Payer: Medicare Other | Admitting: Orthopedic Surgery

## 2016-05-09 DIAGNOSIS — I70262 Atherosclerosis of native arteries of extremities with gangrene, left leg: Secondary | ICD-10-CM | POA: Diagnosis not present

## 2016-05-09 DIAGNOSIS — M25561 Pain in right knee: Secondary | ICD-10-CM | POA: Diagnosis not present

## 2016-05-09 DIAGNOSIS — M25462 Effusion, left knee: Secondary | ICD-10-CM | POA: Diagnosis not present

## 2016-05-09 DIAGNOSIS — M25562 Pain in left knee: Secondary | ICD-10-CM

## 2016-05-10 ENCOUNTER — Telehealth: Payer: Self-pay | Admitting: *Deleted

## 2016-05-10 ENCOUNTER — Other Ambulatory Visit (HOSPITAL_COMMUNITY): Payer: Self-pay | Admitting: Family

## 2016-05-10 DIAGNOSIS — I5020 Unspecified systolic (congestive) heart failure: Secondary | ICD-10-CM | POA: Diagnosis not present

## 2016-05-10 DIAGNOSIS — I110 Hypertensive heart disease with heart failure: Secondary | ICD-10-CM | POA: Diagnosis not present

## 2016-05-10 DIAGNOSIS — S90822D Blister (nonthermal), left foot, subsequent encounter: Secondary | ICD-10-CM | POA: Diagnosis not present

## 2016-05-10 DIAGNOSIS — T8454XS Infection and inflammatory reaction due to internal left knee prosthesis, sequela: Secondary | ICD-10-CM

## 2016-05-10 DIAGNOSIS — Z471 Aftercare following joint replacement surgery: Secondary | ICD-10-CM | POA: Diagnosis not present

## 2016-05-10 DIAGNOSIS — R419 Unspecified symptoms and signs involving cognitive functions and awareness: Secondary | ICD-10-CM | POA: Diagnosis not present

## 2016-05-10 DIAGNOSIS — I2510 Atherosclerotic heart disease of native coronary artery without angina pectoris: Secondary | ICD-10-CM | POA: Diagnosis not present

## 2016-05-11 ENCOUNTER — Ambulatory Visit (HOSPITAL_COMMUNITY)
Admission: RE | Admit: 2016-05-11 | Discharge: 2016-05-11 | Disposition: A | Discharge status: Home / Self Care | Payer: Medicare Other | Source: Ambulatory Visit | Attending: Family | Admitting: Family

## 2016-05-11 ENCOUNTER — Encounter (HOSPITAL_COMMUNITY): Payer: Self-pay | Admitting: Interventional Radiology

## 2016-05-11 ENCOUNTER — Other Ambulatory Visit (HOSPITAL_COMMUNITY): Payer: Self-pay | Admitting: Family

## 2016-05-11 DIAGNOSIS — T8454XS Infection and inflammatory reaction due to internal left knee prosthesis, sequela: Secondary | ICD-10-CM

## 2016-05-11 DIAGNOSIS — Z452 Encounter for adjustment and management of vascular access device: Secondary | ICD-10-CM | POA: Diagnosis not present

## 2016-05-11 DIAGNOSIS — I878 Other specified disorders of veins: Secondary | ICD-10-CM | POA: Insufficient documentation

## 2016-05-11 HISTORY — PX: IR GENERIC HISTORICAL: IMG1180011

## 2016-05-11 MED ORDER — LIDOCAINE HCL 1 % IJ SOLN
INTRAMUSCULAR | Status: AC
  Filled 2016-05-11: qty 20

## 2016-05-11 MED ORDER — IOPAMIDOL (ISOVUE-300) INJECTION 61%
INTRAVENOUS | Status: AC
  Administered 2016-05-11: 10 mL
  Filled 2016-05-11: qty 50

## 2016-05-11 MED ORDER — LIDOCAINE HCL 1 % IJ SOLN
INTRAMUSCULAR | Status: DC | PRN
  Administered 2016-05-11: 5 mL

## 2016-05-11 MED ORDER — LIDOCAINE-EPINEPHRINE (PF) 1 %-1:200000 IJ SOLN
INTRAMUSCULAR | Status: AC
  Filled 2016-05-11: qty 30

## 2016-05-11 MED ORDER — HEPARIN SOD (PORK) LOCK FLUSH 100 UNIT/ML IV SOLN
INTRAVENOUS | Status: AC
  Filled 2016-05-11: qty 5

## 2016-05-11 NOTE — Procedures (Signed)
Successful placement of right brachial vein approach single lumen PICC line with tip at the superior caval-atrial junction.  EBL: Minimal No immediate post procedural complication. The PICC line is ready for immediate use.  Ronny Bacon, MD Pager #: [phone]

## 2016-05-11 NOTE — Telephone Encounter (Signed)
Received message from Dr. Jess Barters office that patient was set up for a PICC placement on 10/13 at 8 am.  Per message, Dr. Baxter Flattery would be setting up and managing antibiotic orders. Cassie paged Dr. Baxter Flattery, received verbal order for ceftriaxone 2 gm IV daily for 6 weeks, and vancomycin per protocol for 6 weeks with labs as per protocol via [place].  Patient currently with [facility] for PT/OT/speech therapy - they are willing to do the Skilled Nursing visits as well if [facility] will supply and dose the medication.  Cassie spoke with patient who was unaware that she would need a PICC or IV antibiotics again. RN spoke with patient's son and daughter-in-law. They were in communication with Dr. Jess Barters office and were aware of the PICC placement and plan for IV antibiotics but not aware of the appointment at 8am nor how to actually administer the medication.  They were planning to speak with the patient later that evening to let her know what was going on. RN spoke with daughter in law regarding the patient's needs to assess if she and her family would be able to administer the medication as much as twice a day, and if there would be a capable family member there for teaching. Per her daughter in law, the patient went to a rehab facility for PICC management last time.  Unforturnately, she is unable to do that this time due to her insurance (she has already used her 100 days).  Patient's family will make accommodations for her care while they have travel plans.  They will make sure someone is available both for teaching and to assist the patient throughout the 6 weeks of antibiotics.  Start of antibiotics scheduled for 10/14. Per Dr. Baxter Flattery, ok to pull picc at end of 6 week therapy.  Landis Gandy, RN

## 2016-05-12 DIAGNOSIS — I110 Hypertensive heart disease with heart failure: Secondary | ICD-10-CM | POA: Diagnosis not present

## 2016-05-12 DIAGNOSIS — R419 Unspecified symptoms and signs involving cognitive functions and awareness: Secondary | ICD-10-CM | POA: Diagnosis not present

## 2016-05-12 DIAGNOSIS — I5020 Unspecified systolic (congestive) heart failure: Secondary | ICD-10-CM | POA: Diagnosis not present

## 2016-05-12 DIAGNOSIS — Z471 Aftercare following joint replacement surgery: Secondary | ICD-10-CM | POA: Diagnosis not present

## 2016-05-12 DIAGNOSIS — I2510 Atherosclerotic heart disease of native coronary artery without angina pectoris: Secondary | ICD-10-CM | POA: Diagnosis not present

## 2016-05-12 DIAGNOSIS — S90822D Blister (nonthermal), left foot, subsequent encounter: Secondary | ICD-10-CM | POA: Diagnosis not present

## 2016-05-14 DIAGNOSIS — Z471 Aftercare following joint replacement surgery: Secondary | ICD-10-CM | POA: Diagnosis not present

## 2016-05-14 DIAGNOSIS — I2510 Atherosclerotic heart disease of native coronary artery without angina pectoris: Secondary | ICD-10-CM | POA: Diagnosis not present

## 2016-05-14 DIAGNOSIS — S90822D Blister (nonthermal), left foot, subsequent encounter: Secondary | ICD-10-CM | POA: Diagnosis not present

## 2016-05-14 DIAGNOSIS — R419 Unspecified symptoms and signs involving cognitive functions and awareness: Secondary | ICD-10-CM | POA: Diagnosis not present

## 2016-05-14 DIAGNOSIS — I5020 Unspecified systolic (congestive) heart failure: Secondary | ICD-10-CM | POA: Diagnosis not present

## 2016-05-14 DIAGNOSIS — I110 Hypertensive heart disease with heart failure: Secondary | ICD-10-CM | POA: Diagnosis not present

## 2016-05-15 ENCOUNTER — Encounter: Payer: Self-pay | Admitting: Internal Medicine

## 2016-05-15 DIAGNOSIS — Z471 Aftercare following joint replacement surgery: Secondary | ICD-10-CM | POA: Diagnosis not present

## 2016-05-15 DIAGNOSIS — I5020 Unspecified systolic (congestive) heart failure: Secondary | ICD-10-CM | POA: Diagnosis not present

## 2016-05-15 DIAGNOSIS — I110 Hypertensive heart disease with heart failure: Secondary | ICD-10-CM | POA: Diagnosis not present

## 2016-05-15 DIAGNOSIS — S90822D Blister (nonthermal), left foot, subsequent encounter: Secondary | ICD-10-CM | POA: Diagnosis not present

## 2016-05-15 DIAGNOSIS — R419 Unspecified symptoms and signs involving cognitive functions and awareness: Secondary | ICD-10-CM | POA: Diagnosis not present

## 2016-05-15 DIAGNOSIS — Z452 Encounter for adjustment and management of vascular access device: Secondary | ICD-10-CM | POA: Diagnosis not present

## 2016-05-15 DIAGNOSIS — I2510 Atherosclerotic heart disease of native coronary artery without angina pectoris: Secondary | ICD-10-CM | POA: Diagnosis not present

## 2016-05-16 ENCOUNTER — Ambulatory Visit (INDEPENDENT_AMBULATORY_CARE_PROVIDER_SITE_OTHER): Payer: Medicare Other | Admitting: Orthopedic Surgery

## 2016-05-16 DIAGNOSIS — M25562 Pain in left knee: Secondary | ICD-10-CM

## 2016-05-16 DIAGNOSIS — R419 Unspecified symptoms and signs involving cognitive functions and awareness: Secondary | ICD-10-CM | POA: Diagnosis not present

## 2016-05-16 DIAGNOSIS — S90822D Blister (nonthermal), left foot, subsequent encounter: Secondary | ICD-10-CM | POA: Diagnosis not present

## 2016-05-16 DIAGNOSIS — Z471 Aftercare following joint replacement surgery: Secondary | ICD-10-CM | POA: Diagnosis not present

## 2016-05-16 DIAGNOSIS — I5020 Unspecified systolic (congestive) heart failure: Secondary | ICD-10-CM | POA: Diagnosis not present

## 2016-05-16 DIAGNOSIS — I110 Hypertensive heart disease with heart failure: Secondary | ICD-10-CM | POA: Diagnosis not present

## 2016-05-16 DIAGNOSIS — I2510 Atherosclerotic heart disease of native coronary artery without angina pectoris: Secondary | ICD-10-CM | POA: Diagnosis not present

## 2016-05-17 ENCOUNTER — Encounter: Payer: Self-pay | Admitting: Internal Medicine

## 2016-05-17 DIAGNOSIS — Z471 Aftercare following joint replacement surgery: Secondary | ICD-10-CM | POA: Diagnosis not present

## 2016-05-17 DIAGNOSIS — S90822D Blister (nonthermal), left foot, subsequent encounter: Secondary | ICD-10-CM | POA: Diagnosis not present

## 2016-05-17 DIAGNOSIS — I5020 Unspecified systolic (congestive) heart failure: Secondary | ICD-10-CM | POA: Diagnosis not present

## 2016-05-17 DIAGNOSIS — R419 Unspecified symptoms and signs involving cognitive functions and awareness: Secondary | ICD-10-CM | POA: Diagnosis not present

## 2016-05-17 DIAGNOSIS — I2510 Atherosclerotic heart disease of native coronary artery without angina pectoris: Secondary | ICD-10-CM | POA: Diagnosis not present

## 2016-05-17 DIAGNOSIS — I110 Hypertensive heart disease with heart failure: Secondary | ICD-10-CM | POA: Diagnosis not present

## 2016-05-17 DIAGNOSIS — A419 Sepsis, unspecified organism: Secondary | ICD-10-CM | POA: Diagnosis not present

## 2016-05-17 DIAGNOSIS — K922 Gastrointestinal hemorrhage, unspecified: Secondary | ICD-10-CM | POA: Diagnosis not present

## 2016-05-18 DIAGNOSIS — I2510 Atherosclerotic heart disease of native coronary artery without angina pectoris: Secondary | ICD-10-CM | POA: Diagnosis not present

## 2016-05-18 DIAGNOSIS — I5020 Unspecified systolic (congestive) heart failure: Secondary | ICD-10-CM | POA: Diagnosis not present

## 2016-05-18 DIAGNOSIS — Z471 Aftercare following joint replacement surgery: Secondary | ICD-10-CM | POA: Diagnosis not present

## 2016-05-18 DIAGNOSIS — R419 Unspecified symptoms and signs involving cognitive functions and awareness: Secondary | ICD-10-CM | POA: Diagnosis not present

## 2016-05-18 DIAGNOSIS — S90822D Blister (nonthermal), left foot, subsequent encounter: Secondary | ICD-10-CM | POA: Diagnosis not present

## 2016-05-18 DIAGNOSIS — I110 Hypertensive heart disease with heart failure: Secondary | ICD-10-CM | POA: Diagnosis not present

## 2016-05-21 ENCOUNTER — Encounter: Payer: Self-pay | Admitting: Internal Medicine

## 2016-05-21 DIAGNOSIS — R419 Unspecified symptoms and signs involving cognitive functions and awareness: Secondary | ICD-10-CM | POA: Diagnosis not present

## 2016-05-21 DIAGNOSIS — I5020 Unspecified systolic (congestive) heart failure: Secondary | ICD-10-CM | POA: Diagnosis not present

## 2016-05-21 DIAGNOSIS — Z452 Encounter for adjustment and management of vascular access device: Secondary | ICD-10-CM | POA: Diagnosis not present

## 2016-05-21 DIAGNOSIS — Z471 Aftercare following joint replacement surgery: Secondary | ICD-10-CM | POA: Diagnosis not present

## 2016-05-21 DIAGNOSIS — I2510 Atherosclerotic heart disease of native coronary artery without angina pectoris: Secondary | ICD-10-CM | POA: Diagnosis not present

## 2016-05-21 DIAGNOSIS — I110 Hypertensive heart disease with heart failure: Secondary | ICD-10-CM | POA: Diagnosis not present

## 2016-05-21 DIAGNOSIS — S90822D Blister (nonthermal), left foot, subsequent encounter: Secondary | ICD-10-CM | POA: Diagnosis not present

## 2016-05-22 ENCOUNTER — Telehealth (INDEPENDENT_AMBULATORY_CARE_PROVIDER_SITE_OTHER): Payer: Self-pay | Admitting: Orthopedic Surgery

## 2016-05-22 DIAGNOSIS — I739 Peripheral vascular disease, unspecified: Secondary | ICD-10-CM | POA: Diagnosis not present

## 2016-05-22 DIAGNOSIS — M009 Pyogenic arthritis, unspecified: Secondary | ICD-10-CM | POA: Diagnosis not present

## 2016-05-22 DIAGNOSIS — J449 Chronic obstructive pulmonary disease, unspecified: Secondary | ICD-10-CM | POA: Diagnosis not present

## 2016-05-22 DIAGNOSIS — T8454XA Infection and inflammatory reaction due to internal left knee prosthesis, initial encounter: Secondary | ICD-10-CM | POA: Diagnosis not present

## 2016-05-22 DIAGNOSIS — I5023 Acute on chronic systolic (congestive) heart failure: Secondary | ICD-10-CM | POA: Diagnosis not present

## 2016-05-22 DIAGNOSIS — I110 Hypertensive heart disease with heart failure: Secondary | ICD-10-CM | POA: Diagnosis not present

## 2016-05-22 NOTE — Telephone Encounter (Signed)
[name] from [place] at [facility] is requesting extending [place] for this patient.     Verbal Order: 2 times a week for 5 weeks

## 2016-05-23 DIAGNOSIS — I110 Hypertensive heart disease with heart failure: Secondary | ICD-10-CM | POA: Diagnosis not present

## 2016-05-23 DIAGNOSIS — T8454XA Infection and inflammatory reaction due to internal left knee prosthesis, initial encounter: Secondary | ICD-10-CM | POA: Diagnosis not present

## 2016-05-23 DIAGNOSIS — A419 Sepsis, unspecified organism: Secondary | ICD-10-CM | POA: Diagnosis not present

## 2016-05-23 DIAGNOSIS — J449 Chronic obstructive pulmonary disease, unspecified: Secondary | ICD-10-CM | POA: Diagnosis not present

## 2016-05-23 DIAGNOSIS — I739 Peripheral vascular disease, unspecified: Secondary | ICD-10-CM | POA: Diagnosis not present

## 2016-05-23 DIAGNOSIS — M009 Pyogenic arthritis, unspecified: Secondary | ICD-10-CM | POA: Diagnosis not present

## 2016-05-23 DIAGNOSIS — I5023 Acute on chronic systolic (congestive) heart failure: Secondary | ICD-10-CM | POA: Diagnosis not present

## 2016-05-24 DIAGNOSIS — I5023 Acute on chronic systolic (congestive) heart failure: Secondary | ICD-10-CM | POA: Diagnosis not present

## 2016-05-24 DIAGNOSIS — T8454XA Infection and inflammatory reaction due to internal left knee prosthesis, initial encounter: Secondary | ICD-10-CM | POA: Diagnosis not present

## 2016-05-24 DIAGNOSIS — M009 Pyogenic arthritis, unspecified: Secondary | ICD-10-CM | POA: Diagnosis not present

## 2016-05-24 DIAGNOSIS — I110 Hypertensive heart disease with heart failure: Secondary | ICD-10-CM | POA: Diagnosis not present

## 2016-05-24 DIAGNOSIS — I739 Peripheral vascular disease, unspecified: Secondary | ICD-10-CM | POA: Diagnosis not present

## 2016-05-24 DIAGNOSIS — J449 Chronic obstructive pulmonary disease, unspecified: Secondary | ICD-10-CM | POA: Diagnosis not present

## 2016-05-25 ENCOUNTER — Other Ambulatory Visit: Payer: Self-pay | Admitting: Sports Medicine

## 2016-05-25 NOTE — Telephone Encounter (Signed)
Called and lm on vm to advise per protocol verbal ok to continue with HHPT as requested and to call with questions. Pt was s/p a left total knee.

## 2016-05-28 DIAGNOSIS — T8454XA Infection and inflammatory reaction due to internal left knee prosthesis, initial encounter: Secondary | ICD-10-CM | POA: Diagnosis not present

## 2016-05-28 DIAGNOSIS — A419 Sepsis, unspecified organism: Secondary | ICD-10-CM | POA: Diagnosis not present

## 2016-05-28 DIAGNOSIS — I739 Peripheral vascular disease, unspecified: Secondary | ICD-10-CM | POA: Diagnosis not present

## 2016-05-28 DIAGNOSIS — I5023 Acute on chronic systolic (congestive) heart failure: Secondary | ICD-10-CM | POA: Diagnosis not present

## 2016-05-28 DIAGNOSIS — M009 Pyogenic arthritis, unspecified: Secondary | ICD-10-CM | POA: Diagnosis not present

## 2016-05-28 DIAGNOSIS — J449 Chronic obstructive pulmonary disease, unspecified: Secondary | ICD-10-CM | POA: Diagnosis not present

## 2016-05-28 DIAGNOSIS — I110 Hypertensive heart disease with heart failure: Secondary | ICD-10-CM | POA: Diagnosis not present

## 2016-05-28 NOTE — Telephone Encounter (Signed)
Rx refill

## 2016-05-29 ENCOUNTER — Telehealth: Payer: Self-pay | Admitting: Pharmacist

## 2016-05-29 ENCOUNTER — Encounter: Payer: Self-pay | Admitting: Internal Medicine

## 2016-05-29 ENCOUNTER — Ambulatory Visit (INDEPENDENT_AMBULATORY_CARE_PROVIDER_SITE_OTHER): Payer: Medicare Other | Admitting: Internal Medicine

## 2016-05-29 ENCOUNTER — Telehealth (INDEPENDENT_AMBULATORY_CARE_PROVIDER_SITE_OTHER): Payer: Self-pay | Admitting: *Deleted

## 2016-05-29 DIAGNOSIS — I2510 Atherosclerotic heart disease of native coronary artery without angina pectoris: Secondary | ICD-10-CM

## 2016-05-29 DIAGNOSIS — J449 Chronic obstructive pulmonary disease, unspecified: Secondary | ICD-10-CM | POA: Diagnosis not present

## 2016-05-29 DIAGNOSIS — I5023 Acute on chronic systolic (congestive) heart failure: Secondary | ICD-10-CM | POA: Diagnosis not present

## 2016-05-29 DIAGNOSIS — T8454XA Infection and inflammatory reaction due to internal left knee prosthesis, initial encounter: Secondary | ICD-10-CM | POA: Diagnosis not present

## 2016-05-29 DIAGNOSIS — M009 Pyogenic arthritis, unspecified: Secondary | ICD-10-CM | POA: Diagnosis not present

## 2016-05-29 DIAGNOSIS — I110 Hypertensive heart disease with heart failure: Secondary | ICD-10-CM | POA: Diagnosis not present

## 2016-05-29 DIAGNOSIS — I739 Peripheral vascular disease, unspecified: Secondary | ICD-10-CM | POA: Diagnosis not present

## 2016-05-29 MED ORDER — RIFAMPIN 300 MG PO CAPS: 600.0000 mg | ORAL_CAPSULE | Freq: Every day | ORAL | 11 refills | Status: DC

## 2016-05-29 NOTE — Telephone Encounter (Signed)
Talked with patient pharmacy and advised them of msg concerning Rx for Colchicine.  Pharmacy stated that they would contact the patient to inform her about the Rx.

## 2016-05-29 NOTE — Telephone Encounter (Signed)
Per Dr. Hale Bogus order, called Jeani Hawking at [facility]: Lambert Rhonda W and gave verbal to stop ceftriaxone.  Patient's culture + with MRSA. Will continue vancomycin.

## 2016-05-29 NOTE — Progress Notes (Signed)
[place] for Infectious Disease  Reason for Consult: Persistent MRSA left prosthetic knee infection Referring Physician: Dr. Meridee Score  Patient Active Problem List   Diagnosis Date Noted  . Infection of prosthetic left knee joint ([place]) 10/12/2015    Priority: High  . Wound dehiscence, surgical 10/18/2015    Priority: Medium  . Total knee replacement status 09/28/2015    Priority: Medium  . Hypertensive heart disease   . Herpetic lesions 01/28/2016  . Hydronephrosis   . Acute on chronic systolic heart failure ([place])   . Obstructive uropathy   . Hypokalemia   . Elevated troponin 01/23/2016  . Abnormal echocardiogram 01/23/2016  . Hypoxia   . Pressure ulcer 01/21/2016  . Pyonephrosis   . Hypotension 12/28/2015  . Open knee wound 11/16/2015  . Bruit 07/10/2012  . Leukocytosis 09/19/2011  . Encephalopathy 09/19/2011  . AAA 01/19/2009  . ILIAC ARTERY ANEURYSM 01/19/2009  . Hyperlipidemia 01/14/2009  . TOBACCO ABUSE 01/14/2009  . Essential hypertension 01/14/2009  . Coronary atherosclerosis 01/14/2009  . Cerebrovascular disease 01/14/2009  . Peripheral vascular disease ([place]) 01/14/2009  . CHRONIC OBSTRUCTIVE PULMONARY DISEASE 01/14/2009  . CHEST PAIN-UNSPECIFIED 01/14/2009    Patient's Medications  New Prescriptions   No medications on file  Previous Medications   ACIDOPHILUS (RISAQUAD) CAPS CAPSULE    Take 1 capsule by mouth daily.   ASPIRIN EC 81 MG TABLET    Take 81 mg by mouth daily.   ATORVASTATIN (LIPITOR) 40 MG TABLET    TAKE ONE TABLET BY MOUTH DAILY.   BUDESONIDE-FORMOTEROL (SYMBICORT) 160-4.5 MCG/ACT INHALER    Inhale 2 puffs into the lungs 2 (two) times daily.   CARVEDILOL (COREG) 3.125 MG TABLET    Take 1 tablet (3.125 mg total) by mouth 2 (two) times daily with a meal.   COLCHICINE 0.6 MG TABLET    TAKE ONE TO TWO TABLETS DAILY AS NEEDED FOR GOUT.   FEEDING SUPPLEMENT, ENSURE ENLIVE, (ENSURE ENLIVE) LIQD    Take 237 mLs by mouth 3 (three)  times daily between meals.   FUROSEMIDE (LASIX) 20 MG TABLET    Take 1 tablet (20 mg total) by mouth daily.   LISINOPRIL (PRINIVIL,ZESTRIL) 5 MG TABLET    Take 1 tablet (5 mg total) by mouth daily.   MAGNESIUM OXIDE (MAG-OX) 400 (241.3 MG) MG TABLET    Take 1 tablet (400 mg total) by mouth 2 (two) times daily.   MESALAMINE (LIALDA) 1.2 G EC TABLET    Take 2.4 g by mouth daily with breakfast.   MULTIPLE VITAMIN (MULITIVITAMIN WITH MINERALS) TABS    Take 1 tablet by mouth daily.   PANTOPRAZOLE (PROTONIX) 40 MG TABLET    Take 40 mg by mouth daily.   POTASSIUM CHLORIDE SA (K-DUR,KLOR-CON) 20 MEQ TABLET    Take 1 tablet (20 mEq total) by mouth daily.  Modified Medications   No medications on file  Discontinued Medications   No medications on file    Recommendations: 1. Continue IV vancomycin for 4 more weeks then convert to chronic oral, suppressive doxycycline therapy  2. Start rifampin 600 mg by mouth daily 3. Discontinue ceftriaxone 4. Follow-up in 4 weeks  Assessment: I cannot be certain if the small abscess that was drained is contiguous with her prosthetic joint but I'm very concerned about persistent MRSA prosthetic joint infection. I will stop ceftriaxone and continue IV vancomycin. I will add oral rifampin for its synergistic activity against MRSA. We will  need to watch for potential drug drug interactions that could make her atorvastatin and carvedilol less active. I will see her back in 4 weeks and consider switching to oral doxycycline and rifampin.   HPI: [name] is a 75 y.o. female who underwent left total knee arthroplasty on 09/28/2015. Very soon after her surgery she developed signs of acute infection and underwent incision and drainage with poly-exchange on 10/12/2015. Operative cultures grew MRSA. She was discharged before culture results were available on vancomycin and levofloxacin. She underwent a second incision and drainage procedure for wound dehiscence on  10/18/2015. She required a lateral gastrocnemius muscle flap repair on 11/16/2015. She followed up with my partner in clinic in April and was switched to oral doxycycline with the intention of continuing that for up to 6 months total. She was in and out of several skilled nursing facilities at this time. She had 2 more procedures done on her knee in May and then again in June for skin grafting. She was readmitted to the hospital on 01/20/2016 with sepsis and Proteus bacteremia from a urinary source. Her admission notes indicates that she was on doxycycline at that time. However doxycycline was stopped during that hospitalization and it was not continued upon discharge. She missed her follow-up visit here in clinic. In early October she developed sudden onset of severe pain and redness in her left knee. She was seen back in the office by Dr. Sharol Given who noted a small area of cellulitis approximate 2 cm in diameter over the patellar tendon. He incised and drained a small abscess which has grown MRSA again. A PICC line was placed and she was started back on IV vancomycin and ceftriaxone pending culture results. She has now completed 18 days of IV antibiotic therapy. She is feeling much better with a decrease in pain, redness and swelling.  Review of Systems: Review of Systems  Constitutional: Positive for malaise/fatigue and weight loss. Negative for chills, diaphoresis and fever.       She lost about 40 pounds since her initial surgery in March but has had an improved appetite and has gained about 8 or 9 pounds since she restarted antibiotics.  HENT: Negative for sore throat.   Respiratory: Negative for cough, sputum production and shortness of breath.   Cardiovascular: Negative for chest pain.  Gastrointestinal: Negative for abdominal pain, diarrhea, nausea and vomiting.       She is having more frequent soft stools but no diarrhea.  Genitourinary: Negative for dysuria and frequency.  Musculoskeletal:  Positive for joint pain. Negative for myalgias.  Skin: Negative for rash.  Neurological: Negative for dizziness and headaches.  Psychiatric/Behavioral: Negative for depression.      Past Medical History:  Diagnosis Date  . Arthritis   . CAD (coronary artery disease)    a. 02/2007 s/p PCI/DES ot LCX and RCA;  b. 07/2014 MV: no ischemia/infarct, EF 68%.  . Carotid arterial disease ([place])    a. 05/2015 Carotid U/S: RICA 0-34%, LICA 74-25%, RECA 9-56%, LECA >50% - overall stable, f/u 1 yr.  . Complication of anesthesia    Three days after procedure pt statrted to suffer with memoy loss that took about 4-5 days to come back according to [name]."  . Dilated cardiomyopathy ([place])    a. 05/2005 Echo: EF 65%; b. 01/22/2016 Echo: EF 25-30% (in setting of admission for urosepsis w/ elev trop); c. 01/26/2016 Echo: EF 35-40%, inf, distal apical, apical, and mid to distal septal HK-->felt  to be 2/2 stress induced CM.  [name] Heart murmur   . History of bronchitis   . Hyperlipidemia   . Hypertensive heart disease   . Memory impairment   . MRSA (methicillin resistant staph aureus) culture positive   . Nephrolithiasis    a. 12/2015 UVJ stone w/ hydroureteronephrosis req nephrostomy tube placement.  . Osteoarthritis    a. 12/8614 s/p L TKA complicated by infections req skin grafting.  [name] PVD (peripheral vascular disease) ([place])    a. Bilat common iliac dzs, nl ABI's --> med rx.  . Sepsis ([place])    a. 12/2015 admitted with Proteus mirabilis urosepsis/bacteremia.  . Wears glasses     Social History  Substance Use Topics  . Smoking status: Former Smoker    Packs/day: 0.50    Years: 50.00    Types: Cigarettes    Quit date: 09/28/2015  . Smokeless tobacco: Never Used     Comment: quit 09/27/15  . Alcohol use No    Family History  Problem Relation Age of Onset  . Breast cancer Mother   . Leukemia Father   . Stroke Sister   . Bone cancer     No Known Allergies  OBJECTIVE: Vitals:   05/29/16 1508    BP: (!) 163/78  Pulse: 89  Weight: 109 lb 12 oz (49.8 kg)  Height: 5' 2.5" (1.588 m)   Body mass index is 19.75 kg/m.   Physical Exam  Constitutional: She is oriented to person, place, and time.  She is in no distress. He is accompanied by her son, [name].  Cardiovascular: Normal rate and regular rhythm.   No murmur heard. Pulmonary/Chest: Effort normal and breath sounds normal.  Abdominal: Soft. There is no tenderness.  Musculoskeletal:  She has healed incisions over her left knee. There is a nickel-sized area of superficial scab and scaling skin over her lower patellar tendon. There is no fluctuance. There is only mild surrounding erythema. She has good range of motion with minimal pain.  Neurological: She is alert and oriented to person, place, and time.  Skin: No rash noted.  Right arm PICC site appears normal.  Psychiatric: Mood and affect normal.    Microbiology: No results found for this or any previous visit (from the past 240 hour(s)).  Michel Bickers, MD [facility] [phone] pager   [phone] cell 05/29/2016, 3:42 PM

## 2016-05-29 NOTE — Telephone Encounter (Signed)
Okay to Discontinue colchicine at this time and see how she does.  Allopurinol will not be helpful since this is pseudogout & not typical gout. I do not recommend starting this either way. Please call the pharmacy now she can cancel this medicine.

## 2016-05-29 NOTE — Telephone Encounter (Signed)
See msg below concerning Rx change for patient. Thank You

## 2016-05-29 NOTE — Telephone Encounter (Signed)
Pharmacy called asking if pt can be switched to allopurinol instead of colchecine. The allpurinol is more affordable for the patient.

## 2016-05-29 NOTE — Assessment & Plan Note (Signed)
I cannot be certain if the small abscess that was drained is contiguous with her prosthetic joint but I'm very concerned about persistent MRSA prosthetic joint infection. I will stop ceftriaxone and continue IV vancomycin. I will add oral rifampin for its synergistic activity against MRSA. We will need to watch for potential drug drug interactions that could make her atorvastatin and carvedilol less active. I will see her back in 4 weeks and consider switching to oral doxycycline and rifampin.

## 2016-05-30 ENCOUNTER — Ambulatory Visit (HOSPITAL_COMMUNITY)
Admission: RE | Admit: 2016-05-30 | Discharge: 2016-05-30 | Disposition: A | Discharge status: Home / Self Care | Payer: Medicare Other | Source: Ambulatory Visit | Attending: Cardiology | Admitting: Cardiology

## 2016-05-30 DIAGNOSIS — I2510 Atherosclerotic heart disease of native coronary artery without angina pectoris: Secondary | ICD-10-CM | POA: Diagnosis not present

## 2016-05-30 DIAGNOSIS — I739 Peripheral vascular disease, unspecified: Secondary | ICD-10-CM | POA: Insufficient documentation

## 2016-05-30 DIAGNOSIS — E785 Hyperlipidemia, unspecified: Secondary | ICD-10-CM | POA: Insufficient documentation

## 2016-05-30 DIAGNOSIS — Z951 Presence of aortocoronary bypass graft: Secondary | ICD-10-CM | POA: Insufficient documentation

## 2016-05-30 DIAGNOSIS — J449 Chronic obstructive pulmonary disease, unspecified: Secondary | ICD-10-CM | POA: Insufficient documentation

## 2016-05-30 DIAGNOSIS — I6523 Occlusion and stenosis of bilateral carotid arteries: Secondary | ICD-10-CM | POA: Insufficient documentation

## 2016-05-30 DIAGNOSIS — M009 Pyogenic arthritis, unspecified: Secondary | ICD-10-CM | POA: Diagnosis not present

## 2016-05-30 DIAGNOSIS — I10 Essential (primary) hypertension: Secondary | ICD-10-CM | POA: Diagnosis not present

## 2016-05-30 DIAGNOSIS — I110 Hypertensive heart disease with heart failure: Secondary | ICD-10-CM | POA: Diagnosis not present

## 2016-05-30 DIAGNOSIS — I5023 Acute on chronic systolic (congestive) heart failure: Secondary | ICD-10-CM | POA: Diagnosis not present

## 2016-05-30 DIAGNOSIS — T8454XA Infection and inflammatory reaction due to internal left knee prosthesis, initial encounter: Secondary | ICD-10-CM | POA: Diagnosis not present

## 2016-05-30 DIAGNOSIS — I679 Cerebrovascular disease, unspecified: Secondary | ICD-10-CM

## 2016-05-31 ENCOUNTER — Encounter: Payer: Self-pay | Admitting: Internal Medicine

## 2016-05-31 DIAGNOSIS — I5023 Acute on chronic systolic (congestive) heart failure: Secondary | ICD-10-CM | POA: Diagnosis not present

## 2016-05-31 DIAGNOSIS — I110 Hypertensive heart disease with heart failure: Secondary | ICD-10-CM | POA: Diagnosis not present

## 2016-05-31 DIAGNOSIS — J449 Chronic obstructive pulmonary disease, unspecified: Secondary | ICD-10-CM | POA: Diagnosis not present

## 2016-05-31 DIAGNOSIS — I739 Peripheral vascular disease, unspecified: Secondary | ICD-10-CM | POA: Diagnosis not present

## 2016-05-31 DIAGNOSIS — A419 Sepsis, unspecified organism: Secondary | ICD-10-CM | POA: Diagnosis not present

## 2016-05-31 DIAGNOSIS — Z452 Encounter for adjustment and management of vascular access device: Secondary | ICD-10-CM | POA: Diagnosis not present

## 2016-05-31 DIAGNOSIS — T8454XA Infection and inflammatory reaction due to internal left knee prosthesis, initial encounter: Secondary | ICD-10-CM | POA: Diagnosis not present

## 2016-05-31 DIAGNOSIS — M009 Pyogenic arthritis, unspecified: Secondary | ICD-10-CM | POA: Diagnosis not present

## 2016-06-01 DIAGNOSIS — I110 Hypertensive heart disease with heart failure: Secondary | ICD-10-CM | POA: Diagnosis not present

## 2016-06-01 DIAGNOSIS — J449 Chronic obstructive pulmonary disease, unspecified: Secondary | ICD-10-CM | POA: Diagnosis not present

## 2016-06-01 DIAGNOSIS — I5023 Acute on chronic systolic (congestive) heart failure: Secondary | ICD-10-CM | POA: Diagnosis not present

## 2016-06-01 DIAGNOSIS — M009 Pyogenic arthritis, unspecified: Secondary | ICD-10-CM | POA: Diagnosis not present

## 2016-06-01 DIAGNOSIS — T8454XA Infection and inflammatory reaction due to internal left knee prosthesis, initial encounter: Secondary | ICD-10-CM | POA: Diagnosis not present

## 2016-06-01 DIAGNOSIS — I739 Peripheral vascular disease, unspecified: Secondary | ICD-10-CM | POA: Diagnosis not present

## 2016-06-04 DIAGNOSIS — I739 Peripheral vascular disease, unspecified: Secondary | ICD-10-CM | POA: Diagnosis not present

## 2016-06-04 DIAGNOSIS — Z452 Encounter for adjustment and management of vascular access device: Secondary | ICD-10-CM | POA: Diagnosis not present

## 2016-06-04 DIAGNOSIS — I110 Hypertensive heart disease with heart failure: Secondary | ICD-10-CM | POA: Diagnosis not present

## 2016-06-04 DIAGNOSIS — T8454XA Infection and inflammatory reaction due to internal left knee prosthesis, initial encounter: Secondary | ICD-10-CM | POA: Diagnosis not present

## 2016-06-04 DIAGNOSIS — I5023 Acute on chronic systolic (congestive) heart failure: Secondary | ICD-10-CM | POA: Diagnosis not present

## 2016-06-04 DIAGNOSIS — M009 Pyogenic arthritis, unspecified: Secondary | ICD-10-CM | POA: Diagnosis not present

## 2016-06-04 DIAGNOSIS — J449 Chronic obstructive pulmonary disease, unspecified: Secondary | ICD-10-CM | POA: Diagnosis not present

## 2016-06-04 DIAGNOSIS — A419 Sepsis, unspecified organism: Secondary | ICD-10-CM | POA: Diagnosis not present

## 2016-06-05 DIAGNOSIS — I5023 Acute on chronic systolic (congestive) heart failure: Secondary | ICD-10-CM | POA: Diagnosis not present

## 2016-06-05 DIAGNOSIS — T8454XA Infection and inflammatory reaction due to internal left knee prosthesis, initial encounter: Secondary | ICD-10-CM | POA: Diagnosis not present

## 2016-06-05 DIAGNOSIS — M009 Pyogenic arthritis, unspecified: Secondary | ICD-10-CM | POA: Diagnosis not present

## 2016-06-05 DIAGNOSIS — I739 Peripheral vascular disease, unspecified: Secondary | ICD-10-CM | POA: Diagnosis not present

## 2016-06-05 DIAGNOSIS — I110 Hypertensive heart disease with heart failure: Secondary | ICD-10-CM | POA: Diagnosis not present

## 2016-06-05 DIAGNOSIS — J449 Chronic obstructive pulmonary disease, unspecified: Secondary | ICD-10-CM | POA: Diagnosis not present

## 2016-06-06 ENCOUNTER — Ambulatory Visit (INDEPENDENT_AMBULATORY_CARE_PROVIDER_SITE_OTHER): Payer: Medicare Other | Admitting: Orthopedic Surgery

## 2016-06-06 ENCOUNTER — Encounter (INDEPENDENT_AMBULATORY_CARE_PROVIDER_SITE_OTHER): Payer: Self-pay | Admitting: Orthopedic Surgery

## 2016-06-06 VITALS — Ht 62.0 in | Wt 109.0 lb

## 2016-06-06 DIAGNOSIS — Z96659 Presence of unspecified artificial knee joint: Secondary | ICD-10-CM

## 2016-06-06 DIAGNOSIS — I2510 Atherosclerotic heart disease of native coronary artery without angina pectoris: Secondary | ICD-10-CM | POA: Diagnosis not present

## 2016-06-06 DIAGNOSIS — T8459XS Infection and inflammatory reaction due to other internal joint prosthesis, sequela: Secondary | ICD-10-CM

## 2016-06-06 DIAGNOSIS — M1711 Unilateral primary osteoarthritis, right knee: Secondary | ICD-10-CM | POA: Diagnosis not present

## 2016-06-06 MED ORDER — METHYLPREDNISOLONE ACETATE 40 MG/ML IJ SUSP
40.0000 mg | INTRAMUSCULAR | Status: AC | PRN
  Administered 2016-06-06: 40 mg via INTRA_ARTICULAR

## 2016-06-06 MED ORDER — LIDOCAINE HCL 1 % IJ SOLN
5.0000 mL | INTRAMUSCULAR | Status: AC | PRN
  Administered 2016-06-06: 5 mL

## 2016-06-06 NOTE — Progress Notes (Signed)
Wound Care Note   Patient: [name]           Date of Birth: Nov 21, 1940           MRN: [id number]             PCP: Kirk Ruths, MD Visit Date: 06/06/2016   Assessment & Plan: Visit Diagnoses: No diagnosis found.  Plan: Plan to follow-up in 4 weeks. Patient's right knee was injected for osteoarthritis of the right knee with steroid injection she tolerated this well left knee is showing excellent improvement she is currently on vancomycin and rifampin for 6 weeks IV with vancomycin and oral rifampin. Anticipate she will be on a longer oral regimen.   Follow-Up Instructions: No Follow-up on file.  Orders:  No orders of the defined types were placed in this encounter.  No orders of the defined types were placed in this encounter.     Procedures: No notes on file   Clinical Data: No additional findings.   No images are attached to the encounter.   Subjective: Chief Complaint  Patient presents with  . Left Knee - Follow-up    Left total knee arthroplasty with infection.    Patient presents for 3 week follow up left total knee with infection. She is currently on IV vancomycin and rifampin. She is working with Dr. Wilder Glade from [facility]. She is still having considerable pain with her left knee.     Review of Systems    Objective: Vital Signs: Ht '5\' 2"'$  (1.575 m)   Wt 109 lb (49.4 kg)   BMI 19.94 kg/m   Physical Exam: Examination patient has small area of redness about a centimeter in diameter the wound is completely healed closer is no drainage no cellulitis there is no knee effusion.  Specialty Comments: No specialty comments available.   PMFS History: Patient Active Problem List   Diagnosis Date Noted  . Hypertensive heart disease   . Herpetic lesions 01/28/2016  . Hydronephrosis   . Acute on chronic systolic heart failure ([place])   . Obstructive uropathy   . Hypokalemia   . Elevated troponin 01/23/2016  . Abnormal echocardiogram  01/23/2016  . Hypoxia   . Pressure ulcer 01/21/2016  . Pyonephrosis   . Hypotension 12/28/2015  . Open knee wound 11/16/2015  . Wound dehiscence, surgical 10/18/2015  . Infection of prosthetic left knee joint ([place]) 10/12/2015  . Total knee replacement status 09/28/2015  . Bruit 07/10/2012  . Leukocytosis 09/19/2011  . Encephalopathy 09/19/2011  . AAA 01/19/2009  . ILIAC ARTERY ANEURYSM 01/19/2009  . Hyperlipidemia 01/14/2009  . TOBACCO ABUSE 01/14/2009  . Essential hypertension 01/14/2009  . Coronary atherosclerosis 01/14/2009  . Cerebrovascular disease 01/14/2009  . Peripheral vascular disease ([place]) 01/14/2009  . CHRONIC OBSTRUCTIVE PULMONARY DISEASE 01/14/2009  . CHEST PAIN-UNSPECIFIED 01/14/2009   Past Medical History:  Diagnosis Date  . Arthritis   . CAD (coronary artery disease)    a. 02/2007 s/p PCI/DES ot LCX and RCA;  b. 07/2014 MV: no ischemia/infarct, EF 68%.  . Carotid arterial disease ([place])    a. 05/2015 Carotid U/S: RICA 8-30%, LICA 94-07%, RECA 6-80%, LECA >50% - overall stable, f/u 1 yr.  . Complication of anesthesia    Three days after procedure pt statrted to suffer with memoy loss that took about 4-5 days to come back according to [name]."  . Dilated cardiomyopathy ([place])    a. 05/2005 Echo: EF 65%; b. 01/22/2016 Echo: EF 25-30% (in  setting of admission for urosepsis w/ elev trop); c. 01/26/2016 Echo: EF 35-40%, inf, distal apical, apical, and mid to distal septal HK-->felt to be 2/2 stress induced CM.  [name] Heart murmur   . History of bronchitis   . Hyperlipidemia   . Hypertensive heart disease   . Memory impairment   . MRSA (methicillin resistant staph aureus) culture positive   . Nephrolithiasis    a. 12/2015 UVJ stone w/ hydroureteronephrosis req nephrostomy tube placement.  . Osteoarthritis    a. 12/8339 s/p L TKA complicated by infections req skin grafting.  [name] PVD (peripheral vascular disease) ([place])    a. Bilat common iliac dzs, nl ABI's --> med rx.  .  Sepsis ([place])    a. 12/2015 admitted with Proteus mirabilis urosepsis/bacteremia.  . Wears glasses     Family History  Problem Relation Age of Onset  . Breast cancer Mother   . Leukemia Father   . Stroke Sister   . Bone cancer     Past Surgical History:  Procedure Laterality Date  . ABDOMINAL HYSTERECTOMY    . APPLICATION OF A-CELL OF EXTREMITY Left 12/08/2015   Procedure: APPLICATION OF A-CELL OF knee;  Surgeon: Loel Lofty [name], DO;  Location: [facility];  Service: Plastics;  Laterality: Left;  . APPLICATION OF WOUND VAC Left 11/16/2015   Procedure: APPLICATION OF WOUND VAC;  Surgeon: Loel Lofty [name], DO;  Location: [place];  Service: Plastics;  Laterality: Left;  . APPLICATION OF WOUND VAC Left 12/08/2015   Procedure: APPLICATION OF WOUND VAC  AND CAST to knee;  Surgeon: Loel Lofty [name], DO;  Location: [facility];  Service: Plastics;  Laterality: Left;  . COLONOSCOPY    . CORONARY ANGIOPLASTY  07   3 stents placed  . ESOPHAGOGASTRODUODENOSCOPY    . EYE SURGERY Bilateral    cataracts  . FREE FLAP TO EXTREMITY Left 11/16/2015   Procedure: FREE FLAP TO EXTREMITY;  Surgeon: Loel Lofty [name], DO;  Location: [facility];  Service: Plastics;  Laterality: Left;  . I&D EXTREMITY Left 10/18/2015   Procedure: Left Knee Washout, Reclosure;  Surgeon: Newt Minion, MD;  Location: [facility];  Service: Orthopedics;  Laterality: Left;  . I&D EXTREMITY Left 12/08/2015   Procedure: IRRIGATION AND DEBRIDEMENT knee;  Surgeon: Loel Lofty [name], DO;  Location: [place];  Service: Plastics;  Laterality: Left;  . I&D KNEE WITH POLY EXCHANGE Left 10/12/2015   Procedure: Irrigation and Debridement , Poly Exchange, Antibiotic Beads Left Knee;  Surgeon: Newt Minion, MD;  Location: [facility];  Service: Orthopedics;  Laterality: Left;  . INCISION AND DRAINAGE OF WOUND Left 01/04/2016   Procedure: DEBRIDEMENT OF LEFT KNEE WOUND WITH ACELL AND WOUND VAC PLACEMENT;  Surgeon: Loel Lofty [name], DO;  Location: [facility];  Service:  Plastics;  Laterality: Left;  . IR GENERIC HISTORICAL  05/11/2016   IR FLUORO GUIDE CV MIDLINE PICC RIGHT 05/11/2016 Sandi Mariscal, MD [facility]  . IR GENERIC HISTORICAL  05/11/2016   IR US GUIDE VASC ACCESS RIGHT 05/11/2016 Sandi Mariscal, MD [facility]  . [place]     denies  . SKIN SPLIT GRAFT Left 11/16/2015   Procedure: LEFT GASTROC MUSCLE FLAP WITH SKIN GRAFT SPLIT THICKNESS AND VAC PLACEMENT;  Surgeon: Loel Lofty [name], DO;  Location: [place];  Service: Plastics;  Laterality: Left;  . TOTAL HIP ARTHROPLASTY Right 09/10/2012   Dr Sharol Given  . TOTAL HIP ARTHROPLASTY Right 09/10/2012   Procedure: TOTAL HIP ARTHROPLASTY;  Surgeon: Beverely Low  Fernanda Drum, MD;  Location: [facility];  Service: Orthopedics;  Laterality: Right;  Right Total Hip Arthroplasty  . TOTAL KNEE ARTHROPLASTY Left 09/28/2015   Procedure: TOTAL KNEE ARTHROPLASTY;  Surgeon: Newt Minion, MD;  Location: [place];  Service: Orthopedics;  Laterality: Left;   Social History   Occupational History  . Retired    Social History Main Topics  . Smoking status: Former Smoker    Packs/day: 0.50    Years: 50.00    Types: Cigarettes    Quit date: 09/28/2015  . Smokeless tobacco: Never Used     Comment: quit 09/27/15  . Alcohol use No  . Drug use: No  . Sexual activity: No

## 2016-06-06 NOTE — Progress Notes (Signed)
   Procedure Note  Patient: [name]             Date of Birth: [birth year]           MRN: [id number]             Visit Date: 06/06/2016  Procedures: Visit Diagnoses: No diagnosis found.  Large Joint Inj Date/Time: 06/06/2016 2:12 PM Performed by: Pamella Pert Authorized by: Newt Minion   Consent Given by:  Patient Site marked: the procedure site was marked   Timeout: prior to procedure the correct patient, procedure, and site was verified   Indications:  Pain and diagnostic evaluation Location:  Knee Site:  R knee Needle Size:  22 G Needle Length:  1.5 inches Ultrasound Guidance: No   Fluoroscopic Guidance: No   Arthrogram: No Medications:  5 mL lidocaine 1 %; 40 mg methylPREDNISolone acetate 40 MG/ML Aspiration Attempted: No   Patient tolerance:  Patient tolerated the procedure well with no immediate complications

## 2016-06-07 DIAGNOSIS — I739 Peripheral vascular disease, unspecified: Secondary | ICD-10-CM | POA: Diagnosis not present

## 2016-06-07 DIAGNOSIS — M009 Pyogenic arthritis, unspecified: Secondary | ICD-10-CM | POA: Diagnosis not present

## 2016-06-07 DIAGNOSIS — T8454XA Infection and inflammatory reaction due to internal left knee prosthesis, initial encounter: Secondary | ICD-10-CM | POA: Diagnosis not present

## 2016-06-07 DIAGNOSIS — Z452 Encounter for adjustment and management of vascular access device: Secondary | ICD-10-CM | POA: Diagnosis not present

## 2016-06-07 DIAGNOSIS — I110 Hypertensive heart disease with heart failure: Secondary | ICD-10-CM | POA: Diagnosis not present

## 2016-06-07 DIAGNOSIS — J449 Chronic obstructive pulmonary disease, unspecified: Secondary | ICD-10-CM | POA: Diagnosis not present

## 2016-06-07 DIAGNOSIS — I5023 Acute on chronic systolic (congestive) heart failure: Secondary | ICD-10-CM | POA: Diagnosis not present

## 2016-06-07 DIAGNOSIS — A419 Sepsis, unspecified organism: Secondary | ICD-10-CM | POA: Diagnosis not present

## 2016-06-08 DIAGNOSIS — T8454XA Infection and inflammatory reaction due to internal left knee prosthesis, initial encounter: Secondary | ICD-10-CM | POA: Diagnosis not present

## 2016-06-08 DIAGNOSIS — J449 Chronic obstructive pulmonary disease, unspecified: Secondary | ICD-10-CM | POA: Diagnosis not present

## 2016-06-08 DIAGNOSIS — I739 Peripheral vascular disease, unspecified: Secondary | ICD-10-CM | POA: Diagnosis not present

## 2016-06-08 DIAGNOSIS — I5023 Acute on chronic systolic (congestive) heart failure: Secondary | ICD-10-CM | POA: Diagnosis not present

## 2016-06-08 DIAGNOSIS — I110 Hypertensive heart disease with heart failure: Secondary | ICD-10-CM | POA: Diagnosis not present

## 2016-06-08 DIAGNOSIS — M009 Pyogenic arthritis, unspecified: Secondary | ICD-10-CM | POA: Diagnosis not present

## 2016-06-11 ENCOUNTER — Encounter: Payer: Self-pay | Admitting: Internal Medicine

## 2016-06-11 DIAGNOSIS — J449 Chronic obstructive pulmonary disease, unspecified: Secondary | ICD-10-CM | POA: Diagnosis not present

## 2016-06-11 DIAGNOSIS — I739 Peripheral vascular disease, unspecified: Secondary | ICD-10-CM | POA: Diagnosis not present

## 2016-06-11 DIAGNOSIS — A419 Sepsis, unspecified organism: Secondary | ICD-10-CM | POA: Diagnosis not present

## 2016-06-11 DIAGNOSIS — Z452 Encounter for adjustment and management of vascular access device: Secondary | ICD-10-CM | POA: Diagnosis not present

## 2016-06-11 DIAGNOSIS — T8454XA Infection and inflammatory reaction due to internal left knee prosthesis, initial encounter: Secondary | ICD-10-CM | POA: Diagnosis not present

## 2016-06-11 DIAGNOSIS — I110 Hypertensive heart disease with heart failure: Secondary | ICD-10-CM | POA: Diagnosis not present

## 2016-06-11 DIAGNOSIS — I5023 Acute on chronic systolic (congestive) heart failure: Secondary | ICD-10-CM | POA: Diagnosis not present

## 2016-06-11 DIAGNOSIS — M009 Pyogenic arthritis, unspecified: Secondary | ICD-10-CM | POA: Diagnosis not present

## 2016-06-12 DIAGNOSIS — M009 Pyogenic arthritis, unspecified: Secondary | ICD-10-CM | POA: Diagnosis not present

## 2016-06-12 DIAGNOSIS — I5023 Acute on chronic systolic (congestive) heart failure: Secondary | ICD-10-CM | POA: Diagnosis not present

## 2016-06-12 DIAGNOSIS — T8454XA Infection and inflammatory reaction due to internal left knee prosthesis, initial encounter: Secondary | ICD-10-CM | POA: Diagnosis not present

## 2016-06-12 DIAGNOSIS — I110 Hypertensive heart disease with heart failure: Secondary | ICD-10-CM | POA: Diagnosis not present

## 2016-06-12 DIAGNOSIS — I739 Peripheral vascular disease, unspecified: Secondary | ICD-10-CM | POA: Diagnosis not present

## 2016-06-12 DIAGNOSIS — J449 Chronic obstructive pulmonary disease, unspecified: Secondary | ICD-10-CM | POA: Diagnosis not present

## 2016-06-13 DIAGNOSIS — I739 Peripheral vascular disease, unspecified: Secondary | ICD-10-CM | POA: Diagnosis not present

## 2016-06-13 DIAGNOSIS — I110 Hypertensive heart disease with heart failure: Secondary | ICD-10-CM | POA: Diagnosis not present

## 2016-06-13 DIAGNOSIS — T8454XA Infection and inflammatory reaction due to internal left knee prosthesis, initial encounter: Secondary | ICD-10-CM | POA: Diagnosis not present

## 2016-06-13 DIAGNOSIS — M009 Pyogenic arthritis, unspecified: Secondary | ICD-10-CM | POA: Diagnosis not present

## 2016-06-13 DIAGNOSIS — J449 Chronic obstructive pulmonary disease, unspecified: Secondary | ICD-10-CM | POA: Diagnosis not present

## 2016-06-13 DIAGNOSIS — I5023 Acute on chronic systolic (congestive) heart failure: Secondary | ICD-10-CM | POA: Diagnosis not present

## 2016-06-14 DIAGNOSIS — J449 Chronic obstructive pulmonary disease, unspecified: Secondary | ICD-10-CM | POA: Diagnosis not present

## 2016-06-14 DIAGNOSIS — M009 Pyogenic arthritis, unspecified: Secondary | ICD-10-CM | POA: Diagnosis not present

## 2016-06-14 DIAGNOSIS — I5023 Acute on chronic systolic (congestive) heart failure: Secondary | ICD-10-CM | POA: Diagnosis not present

## 2016-06-14 DIAGNOSIS — I110 Hypertensive heart disease with heart failure: Secondary | ICD-10-CM | POA: Diagnosis not present

## 2016-06-14 DIAGNOSIS — T8454XA Infection and inflammatory reaction due to internal left knee prosthesis, initial encounter: Secondary | ICD-10-CM | POA: Diagnosis not present

## 2016-06-14 DIAGNOSIS — I739 Peripheral vascular disease, unspecified: Secondary | ICD-10-CM | POA: Diagnosis not present

## 2016-06-14 DIAGNOSIS — A419 Sepsis, unspecified organism: Secondary | ICD-10-CM | POA: Diagnosis not present

## 2016-06-15 ENCOUNTER — Telehealth (INDEPENDENT_AMBULATORY_CARE_PROVIDER_SITE_OTHER): Payer: Self-pay | Admitting: Orthopedic Surgery

## 2016-06-15 ENCOUNTER — Telehealth: Payer: Self-pay | Admitting: *Deleted

## 2016-06-15 DIAGNOSIS — I5023 Acute on chronic systolic (congestive) heart failure: Secondary | ICD-10-CM | POA: Diagnosis not present

## 2016-06-15 DIAGNOSIS — J449 Chronic obstructive pulmonary disease, unspecified: Secondary | ICD-10-CM | POA: Diagnosis not present

## 2016-06-15 DIAGNOSIS — M009 Pyogenic arthritis, unspecified: Secondary | ICD-10-CM | POA: Diagnosis not present

## 2016-06-15 DIAGNOSIS — I739 Peripheral vascular disease, unspecified: Secondary | ICD-10-CM | POA: Diagnosis not present

## 2016-06-15 DIAGNOSIS — I110 Hypertensive heart disease with heart failure: Secondary | ICD-10-CM | POA: Diagnosis not present

## 2016-06-15 DIAGNOSIS — T8454XA Infection and inflammatory reaction due to internal left knee prosthesis, initial encounter: Secondary | ICD-10-CM | POA: Diagnosis not present

## 2016-06-15 NOTE — Telephone Encounter (Signed)
Brett Canales with [facility] called stating patient's IV medication runs out on 06/22/16 and she will need a new order called to [facility] to continue her meds. Her appt with Dr. Megan Salon is 06/26/16 and per his last office note states he will access at that time regarding switching to oral antibiotic. Please advise

## 2016-06-15 NOTE — Telephone Encounter (Signed)
I called left voicemail for [name] with [facility] to give verbal ok for extension of occupational therapy.

## 2016-06-15 NOTE — Telephone Encounter (Signed)
Tanzania from [facility] called requesting an extension for home health Occupational Therapy provided for [name]. She's requesting and extension for twice a week for three weeks. Please give her a phone call regarding this.  [name]'s ph# [phone] Thank you.

## 2016-06-17 DIAGNOSIS — I5023 Acute on chronic systolic (congestive) heart failure: Secondary | ICD-10-CM | POA: Diagnosis not present

## 2016-06-17 DIAGNOSIS — I739 Peripheral vascular disease, unspecified: Secondary | ICD-10-CM | POA: Diagnosis not present

## 2016-06-17 DIAGNOSIS — J449 Chronic obstructive pulmonary disease, unspecified: Secondary | ICD-10-CM | POA: Diagnosis not present

## 2016-06-17 DIAGNOSIS — T8454XA Infection and inflammatory reaction due to internal left knee prosthesis, initial encounter: Secondary | ICD-10-CM | POA: Diagnosis not present

## 2016-06-17 DIAGNOSIS — I110 Hypertensive heart disease with heart failure: Secondary | ICD-10-CM | POA: Diagnosis not present

## 2016-06-17 DIAGNOSIS — M009 Pyogenic arthritis, unspecified: Secondary | ICD-10-CM | POA: Diagnosis not present

## 2016-06-18 ENCOUNTER — Other Ambulatory Visit: Payer: Self-pay | Admitting: Cardiology

## 2016-06-18 DIAGNOSIS — T8454XA Infection and inflammatory reaction due to internal left knee prosthesis, initial encounter: Secondary | ICD-10-CM | POA: Diagnosis not present

## 2016-06-18 DIAGNOSIS — Z452 Encounter for adjustment and management of vascular access device: Secondary | ICD-10-CM | POA: Diagnosis not present

## 2016-06-18 DIAGNOSIS — Z79899 Other long term (current) drug therapy: Secondary | ICD-10-CM | POA: Diagnosis not present

## 2016-06-18 DIAGNOSIS — I110 Hypertensive heart disease with heart failure: Secondary | ICD-10-CM | POA: Diagnosis not present

## 2016-06-18 DIAGNOSIS — I5023 Acute on chronic systolic (congestive) heart failure: Secondary | ICD-10-CM | POA: Diagnosis not present

## 2016-06-18 DIAGNOSIS — I739 Peripheral vascular disease, unspecified: Secondary | ICD-10-CM | POA: Diagnosis not present

## 2016-06-18 DIAGNOSIS — M009 Pyogenic arthritis, unspecified: Secondary | ICD-10-CM | POA: Diagnosis not present

## 2016-06-18 DIAGNOSIS — A419 Sepsis, unspecified organism: Secondary | ICD-10-CM | POA: Diagnosis not present

## 2016-06-18 DIAGNOSIS — J449 Chronic obstructive pulmonary disease, unspecified: Secondary | ICD-10-CM | POA: Diagnosis not present

## 2016-06-18 NOTE — Telephone Encounter (Signed)
Please give them an order to extend IV vancomycin through her appointment on 06/26/2016.

## 2016-06-18 NOTE — Telephone Encounter (Signed)
Coretta at [facility] notified. Myrtis Hopping

## 2016-06-19 ENCOUNTER — Other Ambulatory Visit: Payer: Self-pay

## 2016-06-19 DIAGNOSIS — I5023 Acute on chronic systolic (congestive) heart failure: Secondary | ICD-10-CM | POA: Diagnosis not present

## 2016-06-19 DIAGNOSIS — T8454XA Infection and inflammatory reaction due to internal left knee prosthesis, initial encounter: Secondary | ICD-10-CM | POA: Diagnosis not present

## 2016-06-19 DIAGNOSIS — I110 Hypertensive heart disease with heart failure: Secondary | ICD-10-CM | POA: Diagnosis not present

## 2016-06-19 DIAGNOSIS — J449 Chronic obstructive pulmonary disease, unspecified: Secondary | ICD-10-CM | POA: Diagnosis not present

## 2016-06-19 DIAGNOSIS — M009 Pyogenic arthritis, unspecified: Secondary | ICD-10-CM | POA: Diagnosis not present

## 2016-06-19 DIAGNOSIS — I739 Peripheral vascular disease, unspecified: Secondary | ICD-10-CM | POA: Diagnosis not present

## 2016-06-19 MED ORDER — ATORVASTATIN CALCIUM 40 MG PO TABS: 40.0000 mg | ORAL_TABLET | Freq: Every day | ORAL | 4 refills | Status: DC

## 2016-06-20 DIAGNOSIS — I110 Hypertensive heart disease with heart failure: Secondary | ICD-10-CM | POA: Diagnosis not present

## 2016-06-20 DIAGNOSIS — I739 Peripheral vascular disease, unspecified: Secondary | ICD-10-CM | POA: Diagnosis not present

## 2016-06-20 DIAGNOSIS — J449 Chronic obstructive pulmonary disease, unspecified: Secondary | ICD-10-CM | POA: Diagnosis not present

## 2016-06-20 DIAGNOSIS — M009 Pyogenic arthritis, unspecified: Secondary | ICD-10-CM | POA: Diagnosis not present

## 2016-06-20 DIAGNOSIS — T8454XA Infection and inflammatory reaction due to internal left knee prosthesis, initial encounter: Secondary | ICD-10-CM | POA: Diagnosis not present

## 2016-06-20 DIAGNOSIS — I5023 Acute on chronic systolic (congestive) heart failure: Secondary | ICD-10-CM | POA: Diagnosis not present

## 2016-06-22 ENCOUNTER — Emergency Department (HOSPITAL_COMMUNITY)
Admission: EM | Admit: 2016-06-22 | Discharge: 2016-06-23 | Disposition: A | Discharge status: Home / Self Care | Payer: Medicare Other | Source: Home / Self Care | Attending: Emergency Medicine | Admitting: Emergency Medicine

## 2016-06-22 ENCOUNTER — Emergency Department (HOSPITAL_COMMUNITY): Payer: Medicare Other

## 2016-06-22 ENCOUNTER — Encounter (HOSPITAL_COMMUNITY): Payer: Self-pay

## 2016-06-22 DIAGNOSIS — I739 Peripheral vascular disease, unspecified: Secondary | ICD-10-CM | POA: Diagnosis not present

## 2016-06-22 DIAGNOSIS — Z96652 Presence of left artificial knee joint: Secondary | ICD-10-CM

## 2016-06-22 DIAGNOSIS — I110 Hypertensive heart disease with heart failure: Secondary | ICD-10-CM

## 2016-06-22 DIAGNOSIS — M009 Pyogenic arthritis, unspecified: Secondary | ICD-10-CM | POA: Diagnosis not present

## 2016-06-22 DIAGNOSIS — M25562 Pain in left knee: Secondary | ICD-10-CM

## 2016-06-22 DIAGNOSIS — Z7982 Long term (current) use of aspirin: Secondary | ICD-10-CM | POA: Insufficient documentation

## 2016-06-22 DIAGNOSIS — S72002A Fracture of unspecified part of neck of left femur, initial encounter for closed fracture: Secondary | ICD-10-CM | POA: Diagnosis not present

## 2016-06-22 DIAGNOSIS — M7989 Other specified soft tissue disorders: Secondary | ICD-10-CM | POA: Diagnosis not present

## 2016-06-22 DIAGNOSIS — I5023 Acute on chronic systolic (congestive) heart failure: Secondary | ICD-10-CM | POA: Insufficient documentation

## 2016-06-22 DIAGNOSIS — R000 Tachycardia, unspecified: Secondary | ICD-10-CM | POA: Diagnosis not present

## 2016-06-22 DIAGNOSIS — R739 Hyperglycemia, unspecified: Secondary | ICD-10-CM | POA: Diagnosis not present

## 2016-06-22 DIAGNOSIS — Z96641 Presence of right artificial hip joint: Secondary | ICD-10-CM

## 2016-06-22 DIAGNOSIS — L89620 Pressure ulcer of left heel, unstageable: Secondary | ICD-10-CM | POA: Diagnosis not present

## 2016-06-22 DIAGNOSIS — I2510 Atherosclerotic heart disease of native coronary artery without angina pectoris: Secondary | ICD-10-CM

## 2016-06-22 DIAGNOSIS — Z66 Do not resuscitate: Secondary | ICD-10-CM | POA: Diagnosis not present

## 2016-06-22 DIAGNOSIS — Z452 Encounter for adjustment and management of vascular access device: Secondary | ICD-10-CM | POA: Diagnosis not present

## 2016-06-22 DIAGNOSIS — Z79899 Other long term (current) drug therapy: Secondary | ICD-10-CM | POA: Diagnosis not present

## 2016-06-22 DIAGNOSIS — A419 Sepsis, unspecified organism: Secondary | ICD-10-CM | POA: Diagnosis not present

## 2016-06-22 DIAGNOSIS — J449 Chronic obstructive pulmonary disease, unspecified: Secondary | ICD-10-CM

## 2016-06-22 DIAGNOSIS — I420 Dilated cardiomyopathy: Secondary | ICD-10-CM | POA: Diagnosis not present

## 2016-06-22 DIAGNOSIS — S72091A Other fracture of head and neck of right femur, initial encounter for closed fracture: Secondary | ICD-10-CM | POA: Diagnosis not present

## 2016-06-22 DIAGNOSIS — T8454XA Infection and inflammatory reaction due to internal left knee prosthesis, initial encounter: Secondary | ICD-10-CM | POA: Diagnosis not present

## 2016-06-22 DIAGNOSIS — Z87891 Personal history of nicotine dependence: Secondary | ICD-10-CM

## 2016-06-22 LAB — BASIC METABOLIC PANEL
Anion gap: 7 (ref 5–15)
BUN: 17 mg/dL (ref 6–20)
CHLORIDE: 103 mmol/L (ref 101–111)
CO2: 25 mmol/L (ref 22–32)
CREATININE: 0.67 mg/dL (ref 0.44–1.00)
Calcium: 9.1 mg/dL (ref 8.9–10.3)
GFR calc Af Amer: >60 mL/min (ref >60)
GFR calc non Af Amer: >60 mL/min (ref >60)
GLUCOSE: 133 mg/dL — AB (ref 65–99)
POTASSIUM: 4.2 mmol/L (ref 3.5–5.1)
SODIUM: 135 mmol/L (ref 135–145)

## 2016-06-22 LAB — CBC WITH DIFFERENTIAL/PLATELET
Basophils Absolute: 0 10*3/uL (ref 0.0–0.1)
Basophils Relative: 0 %
EOS ABS: 0.2 10*3/uL (ref 0.0–0.7)
EOS PCT: 1 %
HCT: 35.7 % — ABNORMAL LOW (ref 36.0–46.0)
HEMOGLOBIN: 11.6 g/dL — AB (ref 12.0–15.0)
LYMPHS ABS: 1.6 10*3/uL (ref 0.7–4.0)
LYMPHS PCT: 12 %
MCH: 29.4 pg (ref 26.0–34.0)
MCHC: 32.5 g/dL (ref 30.0–36.0)
MCV: 90.6 fL (ref 78.0–100.0)
MONOS PCT: 6 %
Monocytes Absolute: 0.8 10*3/uL (ref 0.1–1.0)
Neutro Abs: 10.8 10*3/uL — ABNORMAL HIGH (ref 1.7–7.7)
Neutrophils Relative %: 81 %
PLATELETS: 346 10*3/uL (ref 150–400)
RBC: 3.94 MIL/uL (ref 3.87–5.11)
RDW: 15.6 % — ABNORMAL HIGH (ref 11.5–15.5)
WBC: 13.4 10*3/uL — ABNORMAL HIGH (ref 4.0–10.5)

## 2016-06-22 LAB — C-REACTIVE PROTEIN: CRP: 3 mg/dL — ABNORMAL HIGH (ref <1.0)

## 2016-06-22 LAB — SEDIMENTATION RATE: SED RATE: 113 mm/h — AB (ref 0–22)

## 2016-06-22 MED ORDER — OXYCODONE-ACETAMINOPHEN 5-325 MG PO TABS
1.0000 | ORAL_TABLET | Freq: Once | ORAL | Status: AC
  Administered 2016-06-22: 1 via ORAL
  Filled 2016-06-22: qty 1

## 2016-06-22 NOTE — ED Provider Notes (Signed)
The patient is a 75 year old female, she has a reported history of multiple surgical procedures on her left knee in the past including a total knee replacement followed by several washout procedures because of ongoing infection and infected hardware. She had a fall this evening where she landed on her knee, she has had significant amount of pain and increased swelling since that time. She denies any other injuries. She has a PICC line in her right upper extremity which is clear of any redness or drainage. She is tachycardic intermittently, her abdomen is soft and lungs are clear. On exam her left knee is held in the flexed position, she has a swollen tender and warm knee.  No fractures Labs c/w known infection Pt desires d/c Ortho was in agreement  I saw and evaluated the patient, reviewed the resident's note and I agree with the findings and plan.   EKG Interpretation  Date/Time:  Friday June 22 2016 21:33:16 EST Ventricular Rate:  107 PR Interval:    QRS Duration: 82 QT Interval:  326 QTC Calculation: 435 R Axis:   39 Text Interpretation:  Sinus tachycardia Since last tracing rate faster Confirmed by Maritssa Haughton  MD, Marylynne Keelin ([id number]) on 06/22/2016 9:49:21 PM       I personally interpreted the EKG as well as the resident and agree with the interpretation on the resident's chart.  Final diagnoses:  Left knee pain, unspecified chronicity      Noemi Chapel, MD 06/23/16 1443

## 2016-06-22 NOTE — ED Provider Notes (Signed)
[facility] DEPT Provider Note   CSN: [id number] Arrival date & time: 06/22/16  2119     History   Chief Complaint Chief Complaint  Patient presents with  . Leg Pain    HPI [name] is a 75 y.o. female.  HPI Patient is a 75 year old female past medical history significant for left knee replacement in February/2017 with subsequent prosthetic joint MRSA infection, status post several washout procedures, currently on IV vancomycin via PICC line, who presents with worsening left knee pain and swelling. She reports she think she has had increased swelling over the past 4-5 days. Today patient reports she fell directly onto her left knee while getting up from sitting. She thinks that she fell onto her knee but she is not sure. She's had increased pain and swelling in the left knee since the fall. She's been unable to bear weight or to perform range of motion of her left knee. Denies fevers, chills or other recent infectious symptoms.  Past Medical History:  Diagnosis Date  . Arthritis   . CAD (coronary artery disease)    a. 02/2007 s/p PCI/DES ot LCX and RCA;  b. 07/2014 MV: no ischemia/infarct, EF 68%.  . Carotid arterial disease ([place])    a. 05/2015 Carotid U/S: RICA 4-78%, LICA 29-56%, RECA 2-13%, LECA >50% - overall stable, f/u 1 yr.  . Complication of anesthesia    Three days after procedure pt statrted to suffer with memoy loss that took about 4-5 days to come back according to [name]."  . Dilated cardiomyopathy ([place])    a. 05/2005 Echo: EF 65%; b. 01/22/2016 Echo: EF 25-30% (in setting of admission for urosepsis w/ elev trop); c. 01/26/2016 Echo: EF 35-40%, inf, distal apical, apical, and mid to distal septal HK-->felt to be 2/2 stress induced CM.  [name] Heart murmur   . History of bronchitis   . Hyperlipidemia   . Hypertensive heart disease   . Memory impairment   . MRSA (methicillin resistant staph aureus) culture positive   . Nephrolithiasis    a. 12/2015 UVJ stone w/  hydroureteronephrosis req nephrostomy tube placement.  . Osteoarthritis    a. 0/8657 s/p L TKA complicated by infections req skin grafting.  [name] PVD (peripheral vascular disease) ([place])    a. Bilat common iliac dzs, nl ABI's --> med rx.  . Sepsis ([place])    a. 12/2015 admitted with Proteus mirabilis urosepsis/bacteremia.  . Wears glasses     Patient Active Problem List   Diagnosis Date Noted  . Hypertensive heart disease   . Herpetic lesions 01/28/2016  . Hydronephrosis   . Acute on chronic systolic heart failure ([place])   . Obstructive uropathy   . Hypokalemia   . Elevated troponin 01/23/2016  . Abnormal echocardiogram 01/23/2016  . Hypoxia   . Pressure ulcer 01/21/2016  . Pyonephrosis   . Hypotension 12/28/2015  . Open knee wound 11/16/2015  . Wound dehiscence, surgical 10/18/2015  . Chronic infection of prosthetic knee ([place]) 10/12/2015  . Total knee replacement status 09/28/2015  . Bruit 07/10/2012  . Leukocytosis 09/19/2011  . Encephalopathy 09/19/2011  . AAA 01/19/2009  . ILIAC ARTERY ANEURYSM 01/19/2009  . Hyperlipidemia 01/14/2009  . TOBACCO ABUSE 01/14/2009  . Essential hypertension 01/14/2009  . Coronary atherosclerosis 01/14/2009  . Cerebrovascular disease 01/14/2009  . Peripheral vascular disease ([place]) 01/14/2009  . CHRONIC OBSTRUCTIVE PULMONARY DISEASE 01/14/2009  . CHEST PAIN-UNSPECIFIED 01/14/2009    Past Surgical History:  Procedure Laterality Date  . ABDOMINAL  HYSTERECTOMY    . APPLICATION OF A-CELL OF EXTREMITY Left 12/08/2015   Procedure: APPLICATION OF A-CELL OF knee;  Surgeon: Loel Lofty [name], DO;  Location: [facility];  Service: Plastics;  Laterality: Left;  . APPLICATION OF WOUND VAC Left 11/16/2015   Procedure: APPLICATION OF WOUND VAC;  Surgeon: Loel Lofty [name], DO;  Location: [facility];  Service: Plastics;  Laterality: Left;  . APPLICATION OF WOUND VAC Left 12/08/2015   Procedure: APPLICATION OF WOUND VAC  AND CAST to knee;  Surgeon: Loel Lofty  [name], DO;  Location: [facility];  Service: Plastics;  Laterality: Left;  . COLONOSCOPY    . CORONARY ANGIOPLASTY  07   3 stents placed  . ESOPHAGOGASTRODUODENOSCOPY    . EYE SURGERY Bilateral    cataracts  . FREE FLAP TO EXTREMITY Left 11/16/2015   Procedure: FREE FLAP TO EXTREMITY;  Surgeon: Loel Lofty [name], DO;  Location: [facility];  Service: Plastics;  Laterality: Left;  . I&D EXTREMITY Left 10/18/2015   Procedure: Left Knee Washout, Reclosure;  Surgeon: Newt Minion, MD;  Location: [facility];  Service: Orthopedics;  Laterality: Left;  . I&D EXTREMITY Left 12/08/2015   Procedure: IRRIGATION AND DEBRIDEMENT knee;  Surgeon: Loel Lofty [name], DO;  Location: [facility];  Service: Plastics;  Laterality: Left;  . I&D KNEE WITH POLY EXCHANGE Left 10/12/2015   Procedure: Irrigation and Debridement , Poly Exchange, Antibiotic Beads Left Knee;  Surgeon: Newt Minion, MD;  Location: [facility];  Service: Orthopedics;  Laterality: Left;  . INCISION AND DRAINAGE OF WOUND Left 01/04/2016   Procedure: DEBRIDEMENT OF LEFT KNEE WOUND WITH ACELL AND WOUND VAC PLACEMENT;  Surgeon: Loel Lofty [name], DO;  Location: [facility];  Service: Plastics;  Laterality: Left;  . IR GENERIC HISTORICAL  05/11/2016   IR FLUORO GUIDE CV MIDLINE PICC RIGHT 05/11/2016 Sandi Mariscal, MD [facility]  . IR GENERIC HISTORICAL  05/11/2016   IR US GUIDE VASC ACCESS RIGHT 05/11/2016 Sandi Mariscal, MD [facility]  . [place]     denies  . SKIN SPLIT GRAFT Left 11/16/2015   Procedure: LEFT GASTROC MUSCLE FLAP WITH SKIN GRAFT SPLIT THICKNESS AND VAC PLACEMENT;  Surgeon: Loel Lofty [name], DO;  Location: [facility];  Service: Plastics;  Laterality: Left;  . TOTAL HIP ARTHROPLASTY Right 09/10/2012   Dr Sharol Given  . TOTAL HIP ARTHROPLASTY Right 09/10/2012   Procedure: TOTAL HIP ARTHROPLASTY;  Surgeon: Newt Minion, MD;  Location: [facility];  Service: Orthopedics;  Laterality: Right;  Right Total Hip Arthroplasty  . TOTAL KNEE ARTHROPLASTY Left 09/28/2015    Procedure: TOTAL KNEE ARTHROPLASTY;  Surgeon: Newt Minion, MD;  Location: [facility];  Service: Orthopedics;  Laterality: Left;    OB History    No data available       Home Medications    Prior to Admission medications   Medication Sig Start Date End Date Taking? Authorizing Provider  acidophilus (RISAQUAD) CAPS capsule Take 1 capsule by mouth daily.   Yes Historical Provider, MD  aspirin EC 81 MG tablet Take 81 mg by mouth daily.   Yes Historical Provider, MD  atorvastatin (LIPITOR) 40 MG tablet Take 1 tablet (40 mg total) by mouth daily. 06/19/16  Yes Lelon Perla, MD  budesonide-formoterol (SYMBICORT) 160-4.5 MCG/ACT inhaler Inhale 2 puffs into the lungs 2 (two) times daily.   Yes Historical Provider, MD  carvedilol (COREG) 3.125 MG tablet Take 1 tablet (3.125 mg total) by mouth 2 (two) times daily with  a meal. 05/03/16  Yes Ok Anis, NP  colchicine 0.6 MG tablet TAKE ONE TO TWO TABLETS DAILY AS NEEDED FOR GOUT. 05/28/16  Yes Andrena Mews, DO  feeding supplement, ENSURE ENLIVE, (ENSURE ENLIVE) LIQD Take 237 mLs by mouth 3 (three) times daily between meals. 02/01/16  Yes Ripudeep Jenna Luo, MD  furosemide (LASIX) 20 MG tablet Take 1 tablet (20 mg total) by mouth daily. 05/03/16  Yes Lewayne Bunting, MD  lisinopril (PRINIVIL,ZESTRIL) 5 MG tablet Take 1 tablet (5 mg total) by mouth daily. 04/13/16 07/12/16 Yes Lewayne Bunting, MD  mesalamine (LIALDA) 1.2 g EC tablet Take 2.4 g by mouth daily with breakfast. 04/11/16 04/11/17 Yes Historical Provider, MD  Multiple Vitamin (MULITIVITAMIN WITH MINERALS) TABS Take 1 tablet by mouth daily.   Yes Historical Provider, MD  pantoprazole (PROTONIX) 40 MG tablet Take 40 mg by mouth daily.   Yes Historical Provider, MD  rifampin (RIFADIN) 300 MG capsule Take 2 capsules (600 mg total) by mouth daily. 05/29/16  Yes Cliffton Asters, MD  vancomycin [facility]) 10 G SOLR injection Inject 10 g into the vein 2 (two) times daily.   Yes Historical Provider,  MD  HYDROcodone-acetaminophen (NORCO/VICODIN) 5-325 MG tablet Take 1 tablet by mouth every 4 (four) hours as needed. 06/23/16   Isa Rankin, MD  potassium chloride SA (K-DUR,KLOR-CON) 20 MEQ tablet Take 1 tablet (20 mEq total) by mouth daily. 05/03/16   Lewayne Bunting, MD    Family History Family History  Problem Relation Age of Onset  . Breast cancer Mother   . Leukemia Father   . Stroke Sister   . Bone cancer      Social History Social History  Substance Use Topics  . Smoking status: Former Smoker    Packs/day: 0.50    Years: 50.00    Types: Cigarettes    Quit date: 09/28/2015  . Smokeless tobacco: Never Used     Comment: quit 09/27/15  . Alcohol use No     Allergies   Patient has no known allergies.   Review of Systems Review of Systems  Constitutional: Negative for chills and fever.  HENT: Negative for ear pain and sore throat.   Eyes: Negative for visual disturbance.  Respiratory: Negative for cough and shortness of breath.   Cardiovascular: Negative for chest pain and palpitations.  Gastrointestinal: Negative for abdominal pain and vomiting.  Genitourinary: Negative for dysuria and hematuria.  Musculoskeletal: Positive for gait problem and joint swelling.  Skin: Negative for color change and rash.  Neurological: Negative for seizures and syncope.  All other systems reviewed and are negative.    Physical Exam Updated Vital Signs BP 163/89   Pulse 116   Temp 98.2 F (36.8 C) (Oral)   Resp 24   Ht 5\' 2"  (1.575 m)   Wt 50.3 kg   SpO2 95%   BMI 20.30 kg/m   Physical Exam  Constitutional: She appears well-developed and well-nourished. She appears distressed (mild distress secondary to left knee pain).  HENT:  Head: Normocephalic and atraumatic.  Eyes: Conjunctivae are normal.  Neck: Neck supple.  Cardiovascular: Regular rhythm.  Tachycardia present.   No murmur heard. Pulmonary/Chest: Effort normal and breath sounds normal. No respiratory distress.    Abdominal: Soft. There is no tenderness.  Musculoskeletal: She exhibits no edema.  RUE PICC line site c/d/i. Left knee is held flexed, with +swelling, mild warmth, no erythema, or fluctuance. Well healed surgical scars. Unable actively extend knee without  significant discomfort; able to minimally extend knee to 45 degrees w/ passive ROM.   Neurological: She is alert.  Skin: Skin is warm and dry.  Psychiatric: She has a normal mood and affect.  Nursing note and vitals reviewed.    ED Treatments / Results  Labs (all labs ordered are listed, but only abnormal results are displayed) Labs Reviewed  CBC WITH DIFFERENTIAL/PLATELET - Abnormal; Notable for the following:       Result Value   WBC 13.4 (*)    Hemoglobin 11.6 (*)    HCT 35.7 (*)    RDW 15.6 (*)    Neutro Abs 10.8 (*)    All other components within normal limits  BASIC METABOLIC PANEL - Abnormal; Notable for the following:    Glucose, Bld 133 (*)    All other components within normal limits  SEDIMENTATION RATE - Abnormal; Notable for the following:    Sed Rate 113 (*)    All other components within normal limits  C-REACTIVE PROTEIN - Abnormal; Notable for the following:    CRP 3.0 (*)    All other components within normal limits    EKG  EKG Interpretation  Date/Time:  Friday June 22 2016 21:33:16 EST Ventricular Rate:  107 PR Interval:    QRS Duration: 82 QT Interval:  326 QTC Calculation: 435 R Axis:   39 Text Interpretation:  Sinus tachycardia Since last tracing rate faster Confirmed by MILLER  MD, BRIAN ([id number]) on 06/22/2016 9:49:21 PM       Radiology Dg Knee 2 Views Left  Result Date: 06/22/2016 CLINICAL DATA:  Patient fell today with left knee swelling and increased pain. Previous knee replacement with hardware infection. EXAM: LEFT KNEE - 1-2 VIEW COMPARISON:  05/16/2016 FINDINGS: Left total knee arthroplasty with patellar femoral component. Components appear well seated. No lucency at the bone  hardware interfaces to suggest loosening or infection. No evidence of acute fracture or dislocation of the left knee. No focal bone lesion or bone destruction. Vascular calcifications. Large calcification in the posterior fossa is unchanged since 09/19/2011. IMPRESSION: Postoperative left total knee arthroplasty. Components appear well seated. No acute bony abnormalities. Electronically Signed   By: Lucienne Capers M.D.   On: 06/22/2016 22:28    Procedures Procedures (including critical care time)  Medications Ordered in ED Medications  oxyCODONE-acetaminophen (PERCOCET/ROXICET) 5-325 MG per tablet 1 tablet (1 tablet Oral Given 06/22/16 2215)  oxyCODONE-acetaminophen (PERCOCET/ROXICET) 5-325 MG per tablet 1 tablet (1 tablet Oral Given 06/23/16 0059)     Initial Impression / Assessment and Plan / ED Course  I have reviewed the triage vital signs and the nursing notes.  Pertinent labs & imaging results that were available during my care of the patient were reviewed by me and considered in my medical decision making (see chart for details).  Clinical Course     Patient is a 75 year old female with significant past medical history left knee replacement with subsequent hardware infection requiring multiple I&D surgeries and IV antibiotics via PICC line who presents with worsening left knee pain after a fall today. Afebrile, but tachycardic on presentation. Exam as above with swollen, tender, mildly warm left knee. Patient able to flex the knee but reports significant pain with extension. By mouth pain medicine given. X-ray obtained and shows no acute fracture, hardware is in place, no large joint effusion. CBC with mild leukocytosis. Sedimentation rate and ESR elevated. Likely consistent with known infection. Patient was discussed by telephone with orthopedic surgery on-call,  Dr Erlinda Hong, who recommends no further intervention. On evaluation after po pain medicine patient has a slightly increased range  of motion. She is able to engage her quadriceps muscle. Patient reports she has daily home physical therapy or occupational therapy. She has a home health nurse who visits twice a week. She lives with her nephew. She has a wheelchair and a walker at home. She prefers to be discharged home with outpatient follow-up. At this time I think discharge is reasonable as patient has a care plan in place. Discharged in stable condition. Prescription for small amount of pain medicine given. Medication precautions given. Strict return precautions given. Patient and son report understanding and agreement with plan.  Patient seen and discussed with Dr. Sabra Heck, ED attending  Final Clinical Impressions(s) / ED Diagnoses   Final diagnoses:  Left knee pain, unspecified chronicity    New Prescriptions New Prescriptions   HYDROCODONE-ACETAMINOPHEN (NORCO/VICODIN) 5-325 MG TABLET    Take 1 tablet by mouth every 4 (four) hours as needed.     Gibson Ramp, MD 06/23/16 [id number]    Noemi Chapel, MD 06/23/16 [phone]

## 2016-06-22 NOTE — ED Triage Notes (Signed)
Pt comes from home. She is being treated with vibramycin iv for infection in left knee. Pt states she has been having pain in left knee for a while but has gotten worse and swelling has increased over the past week. Pt fell at home and landed on left knee causing increased pain and pt states she cant bear weight on leg.She received 154mg Fentanyl IN PTA via EMS

## 2016-06-23 ENCOUNTER — Telehealth: Payer: Self-pay | Admitting: *Deleted

## 2016-06-23 MED ORDER — OXYCODONE-ACETAMINOPHEN 5-325 MG PO TABS
1.0000 | ORAL_TABLET | Freq: Once | ORAL | Status: AC
  Administered 2016-06-23: 1 via ORAL
  Filled 2016-06-23: qty 1

## 2016-06-23 MED ORDER — HYDROCODONE-ACETAMINOPHEN 5-325 MG PO TABS: 1.0000 | ORAL_TABLET | ORAL | 0 refills | Status: DC | PRN

## 2016-06-23 NOTE — Telephone Encounter (Signed)
CM was able to give her the name of the attending MD in the ED supervising the care of this pt. Noemi Chapel, MD. Waited for the pharmacist to make sure the Rx would be approved before we disconnected the call. No further CM needs.

## 2016-06-23 NOTE — Discharge Instructions (Signed)
There were no fractures or hardware malfunctions identified on your x-ray imaging today. Your white blood cell count and inflammatory markers were mildly elevated which may be due to your known infection but could also be due to worsening infection.   Elevate your leg as much as possible and stay off of it for the next 24 hours. Take pain medicine as needed. If you take Norco it may be constipating you should take stool softeners in addition. You may also take Tylenol or ibuprofen as needed or mild pain.  If you experience fevers, severely worsening pain, redness that is expanding around your knee or any new or worsening symptoms return to the emergency department for reevaluation. Otherwise call your doctor on Monday to schedule an appointment next week.

## 2016-06-25 ENCOUNTER — Telehealth (INDEPENDENT_AMBULATORY_CARE_PROVIDER_SITE_OTHER): Payer: Self-pay | Admitting: Orthopedic Surgery

## 2016-06-25 ENCOUNTER — Encounter: Payer: Self-pay | Admitting: Internal Medicine

## 2016-06-25 DIAGNOSIS — I5023 Acute on chronic systolic (congestive) heart failure: Secondary | ICD-10-CM | POA: Diagnosis not present

## 2016-06-25 DIAGNOSIS — A419 Sepsis, unspecified organism: Secondary | ICD-10-CM | POA: Diagnosis not present

## 2016-06-25 DIAGNOSIS — J449 Chronic obstructive pulmonary disease, unspecified: Secondary | ICD-10-CM | POA: Diagnosis not present

## 2016-06-25 DIAGNOSIS — I739 Peripheral vascular disease, unspecified: Secondary | ICD-10-CM | POA: Diagnosis not present

## 2016-06-25 DIAGNOSIS — Z452 Encounter for adjustment and management of vascular access device: Secondary | ICD-10-CM | POA: Diagnosis not present

## 2016-06-25 DIAGNOSIS — T8454XA Infection and inflammatory reaction due to internal left knee prosthesis, initial encounter: Secondary | ICD-10-CM | POA: Diagnosis not present

## 2016-06-25 DIAGNOSIS — I110 Hypertensive heart disease with heart failure: Secondary | ICD-10-CM | POA: Diagnosis not present

## 2016-06-25 DIAGNOSIS — M009 Pyogenic arthritis, unspecified: Secondary | ICD-10-CM | POA: Diagnosis not present

## 2016-06-25 DIAGNOSIS — Z79899 Other long term (current) drug therapy: Secondary | ICD-10-CM | POA: Diagnosis not present

## 2016-06-25 NOTE — Telephone Encounter (Signed)
[name] from [place] called requesting orders to extend the [place] sessions.  She's asking for twice a week x's four weeks starting this week. Please give her a call regarding this.  [name]'s ph# [phone] Thank you.

## 2016-06-25 NOTE — Telephone Encounter (Signed)
I called and left voicemail to advise verbal ok for extension of physical therapy.

## 2016-06-26 ENCOUNTER — Encounter: Payer: Self-pay | Admitting: Internal Medicine

## 2016-06-26 ENCOUNTER — Encounter (HOSPITAL_COMMUNITY): Payer: Self-pay | Admitting: Internal Medicine

## 2016-06-26 ENCOUNTER — Inpatient Hospital Stay (HOSPITAL_COMMUNITY)
Admission: AD | Admit: 2016-06-26 | Discharge: 2016-06-30 | DRG: 470 | Disposition: A | Discharge status: Skilled Nursing Facility | Payer: Medicare Other | Source: Ambulatory Visit | Attending: Internal Medicine | Admitting: Internal Medicine

## 2016-06-26 ENCOUNTER — Ambulatory Visit (INDEPENDENT_AMBULATORY_CARE_PROVIDER_SITE_OTHER): Payer: Medicare Other

## 2016-06-26 ENCOUNTER — Inpatient Hospital Stay: Admit: 2016-06-26 | Payer: Medicare Other | Admitting: Orthopaedic Surgery

## 2016-06-26 ENCOUNTER — Ambulatory Visit (INDEPENDENT_AMBULATORY_CARE_PROVIDER_SITE_OTHER): Payer: Medicare Other | Admitting: Internal Medicine

## 2016-06-26 ENCOUNTER — Inpatient Hospital Stay (HOSPITAL_COMMUNITY): Payer: Medicare Other

## 2016-06-26 ENCOUNTER — Ambulatory Visit (INDEPENDENT_AMBULATORY_CARE_PROVIDER_SITE_OTHER): Payer: Medicare Other | Admitting: Orthopaedic Surgery

## 2016-06-26 DIAGNOSIS — L89620 Pressure ulcer of left heel, unstageable: Secondary | ICD-10-CM | POA: Diagnosis present

## 2016-06-26 DIAGNOSIS — I2510 Atherosclerotic heart disease of native coronary artery without angina pectoris: Secondary | ICD-10-CM

## 2016-06-26 DIAGNOSIS — A4902 Methicillin resistant Staphylococcus aureus infection, unspecified site: Secondary | ICD-10-CM

## 2016-06-26 DIAGNOSIS — S72002A Fracture of unspecified part of neck of left femur, initial encounter for closed fracture: Secondary | ICD-10-CM

## 2016-06-26 DIAGNOSIS — Z66 Do not resuscitate: Secondary | ICD-10-CM | POA: Diagnosis present

## 2016-06-26 DIAGNOSIS — S72091A Other fracture of head and neck of right femur, initial encounter for closed fracture: Secondary | ICD-10-CM | POA: Diagnosis not present

## 2016-06-26 DIAGNOSIS — T8459XD Infection and inflammatory reaction due to other internal joint prosthesis, subsequent encounter: Secondary | ICD-10-CM

## 2016-06-26 DIAGNOSIS — Z8614 Personal history of Methicillin resistant Staphylococcus aureus infection: Secondary | ICD-10-CM

## 2016-06-26 DIAGNOSIS — M25552 Pain in left hip: Secondary | ICD-10-CM

## 2016-06-26 DIAGNOSIS — Z87891 Personal history of nicotine dependence: Secondary | ICD-10-CM | POA: Diagnosis not present

## 2016-06-26 DIAGNOSIS — Z955 Presence of coronary angioplasty implant and graft: Secondary | ICD-10-CM | POA: Diagnosis not present

## 2016-06-26 DIAGNOSIS — I10 Essential (primary) hypertension: Secondary | ICD-10-CM | POA: Diagnosis not present

## 2016-06-26 DIAGNOSIS — K8080 Other cholelithiasis without obstruction: Secondary | ICD-10-CM | POA: Diagnosis present

## 2016-06-26 DIAGNOSIS — S79911A Unspecified injury of right hip, initial encounter: Secondary | ICD-10-CM | POA: Diagnosis not present

## 2016-06-26 DIAGNOSIS — Z87442 Personal history of urinary calculi: Secondary | ICD-10-CM

## 2016-06-26 DIAGNOSIS — R739 Hyperglycemia, unspecified: Secondary | ICD-10-CM | POA: Diagnosis present

## 2016-06-26 DIAGNOSIS — Z96641 Presence of right artificial hip joint: Secondary | ICD-10-CM | POA: Diagnosis present

## 2016-06-26 DIAGNOSIS — K8020 Calculus of gallbladder without cholecystitis without obstruction: Secondary | ICD-10-CM | POA: Diagnosis not present

## 2016-06-26 DIAGNOSIS — I739 Peripheral vascular disease, unspecified: Secondary | ICD-10-CM | POA: Diagnosis present

## 2016-06-26 DIAGNOSIS — M12551 Traumatic arthropathy, right hip: Secondary | ICD-10-CM | POA: Diagnosis not present

## 2016-06-26 DIAGNOSIS — Z96652 Presence of left artificial knee joint: Secondary | ICD-10-CM | POA: Diagnosis present

## 2016-06-26 DIAGNOSIS — W1830XA Fall on same level, unspecified, initial encounter: Secondary | ICD-10-CM | POA: Diagnosis present

## 2016-06-26 DIAGNOSIS — L8990 Pressure ulcer of unspecified site, unspecified stage: Secondary | ICD-10-CM | POA: Insufficient documentation

## 2016-06-26 DIAGNOSIS — D649 Anemia, unspecified: Secondary | ICD-10-CM | POA: Diagnosis present

## 2016-06-26 DIAGNOSIS — Z452 Encounter for adjustment and management of vascular access device: Secondary | ICD-10-CM

## 2016-06-26 DIAGNOSIS — Z471 Aftercare following joint replacement surgery: Secondary | ICD-10-CM | POA: Diagnosis not present

## 2016-06-26 DIAGNOSIS — Z9181 History of falling: Secondary | ICD-10-CM | POA: Diagnosis not present

## 2016-06-26 DIAGNOSIS — R011 Cardiac murmur, unspecified: Secondary | ICD-10-CM | POA: Diagnosis present

## 2016-06-26 DIAGNOSIS — Z96659 Presence of unspecified artificial knee joint: Secondary | ICD-10-CM | POA: Diagnosis not present

## 2016-06-26 DIAGNOSIS — Z96649 Presence of unspecified artificial hip joint: Secondary | ICD-10-CM

## 2016-06-26 DIAGNOSIS — Z419 Encounter for procedure for purposes other than remedying health state, unspecified: Secondary | ICD-10-CM

## 2016-06-26 DIAGNOSIS — N1330 Unspecified hydronephrosis: Secondary | ICD-10-CM | POA: Diagnosis not present

## 2016-06-26 DIAGNOSIS — I420 Dilated cardiomyopathy: Secondary | ICD-10-CM | POA: Diagnosis present

## 2016-06-26 DIAGNOSIS — Z993 Dependence on wheelchair: Secondary | ICD-10-CM | POA: Diagnosis not present

## 2016-06-26 DIAGNOSIS — M1990 Unspecified osteoarthritis, unspecified site: Secondary | ICD-10-CM | POA: Diagnosis present

## 2016-06-26 DIAGNOSIS — Z8673 Personal history of transient ischemic attack (TIA), and cerebral infarction without residual deficits: Secondary | ICD-10-CM | POA: Diagnosis not present

## 2016-06-26 DIAGNOSIS — M25559 Pain in unspecified hip: Secondary | ICD-10-CM | POA: Diagnosis not present

## 2016-06-26 DIAGNOSIS — Z7982 Long term (current) use of aspirin: Secondary | ICD-10-CM

## 2016-06-26 DIAGNOSIS — T8454XA Infection and inflammatory reaction due to internal left knee prosthesis, initial encounter: Secondary | ICD-10-CM

## 2016-06-26 DIAGNOSIS — Z79891 Long term (current) use of opiate analgesic: Secondary | ICD-10-CM

## 2016-06-26 DIAGNOSIS — E785 Hyperlipidemia, unspecified: Secondary | ICD-10-CM | POA: Diagnosis present

## 2016-06-26 DIAGNOSIS — Z96642 Presence of left artificial hip joint: Secondary | ICD-10-CM | POA: Diagnosis not present

## 2016-06-26 DIAGNOSIS — J449 Chronic obstructive pulmonary disease, unspecified: Secondary | ICD-10-CM | POA: Diagnosis not present

## 2016-06-26 DIAGNOSIS — S72002D Fracture of unspecified part of neck of left femur, subsequent encounter for closed fracture with routine healing: Secondary | ICD-10-CM | POA: Diagnosis not present

## 2016-06-26 DIAGNOSIS — I714 Abdominal aortic aneurysm, without rupture: Secondary | ICD-10-CM | POA: Diagnosis not present

## 2016-06-26 DIAGNOSIS — R0989 Other specified symptoms and signs involving the circulatory and respiratory systems: Secondary | ICD-10-CM | POA: Diagnosis not present

## 2016-06-26 DIAGNOSIS — R112 Nausea with vomiting, unspecified: Secondary | ICD-10-CM | POA: Diagnosis not present

## 2016-06-26 DIAGNOSIS — R109 Unspecified abdominal pain: Secondary | ICD-10-CM

## 2016-06-26 DIAGNOSIS — B9562 Methicillin resistant Staphylococcus aureus infection as the cause of diseases classified elsewhere: Secondary | ICD-10-CM | POA: Diagnosis present

## 2016-06-26 DIAGNOSIS — I723 Aneurysm of iliac artery: Secondary | ICD-10-CM | POA: Diagnosis not present

## 2016-06-26 DIAGNOSIS — R1110 Vomiting, unspecified: Secondary | ICD-10-CM

## 2016-06-26 DIAGNOSIS — S72009A Fracture of unspecified part of neck of unspecified femur, initial encounter for closed fracture: Secondary | ICD-10-CM | POA: Diagnosis present

## 2016-06-26 HISTORY — DX: Essential (primary) hypertension: I10

## 2016-06-26 LAB — COMPREHENSIVE METABOLIC PANEL
ALK PHOS: 129 U/L — AB (ref 38–126)
ALT: 47 U/L (ref 14–54)
ANION GAP: 7 (ref 5–15)
AST: 47 U/L — ABNORMAL HIGH (ref 15–41)
Albumin: 2.2 g/dL — ABNORMAL LOW (ref 3.5–5.0)
BILIRUBIN TOTAL: 0.6 mg/dL (ref 0.3–1.2)
BUN: 21 mg/dL — ABNORMAL HIGH (ref 6–20)
CALCIUM: 8.6 mg/dL — AB (ref 8.9–10.3)
CO2: 26 mmol/L (ref 22–32)
Chloride: 104 mmol/L (ref 101–111)
Creatinine, Ser: 0.68 mg/dL (ref 0.44–1.00)
GFR calc non Af Amer: >60 mL/min (ref >60)
Glucose, Bld: 204 mg/dL — ABNORMAL HIGH (ref 65–99)
POTASSIUM: 3.9 mmol/L (ref 3.5–5.1)
Sodium: 137 mmol/L (ref 135–145)
TOTAL PROTEIN: 5.9 g/dL — AB (ref 6.5–8.1)

## 2016-06-26 LAB — APTT: aPTT: 38 s — ABNORMAL HIGH (ref 24–36)

## 2016-06-26 LAB — PROTIME-INR
INR: 1.2
Prothrombin Time: 15.3 s — ABNORMAL HIGH (ref 11.4–15.2)

## 2016-06-26 LAB — CBC WITH DIFFERENTIAL/PLATELET
BASOS PCT: 1 %
Basophils Absolute: 0 10*3/uL (ref 0.0–0.1)
EOS ABS: 0.3 10*3/uL (ref 0.0–0.7)
Eosinophils Relative: 4 %
HCT: 30.1 % — ABNORMAL LOW (ref 36.0–46.0)
HEMOGLOBIN: 9.4 g/dL — AB (ref 12.0–15.0)
LYMPHS ABS: 1.7 10*3/uL (ref 0.7–4.0)
Lymphocytes Relative: 23 %
MCH: 28.7 pg (ref 26.0–34.0)
MCHC: 31.2 g/dL (ref 30.0–36.0)
MCV: 91.8 fL (ref 78.0–100.0)
MONO ABS: 0.6 10*3/uL (ref 0.1–1.0)
MONOS PCT: 8 %
NEUTROS ABS: 4.9 10*3/uL (ref 1.7–7.7)
NEUTROS PCT: 64 %
Platelets: 310 10*3/uL (ref 150–400)
RBC: 3.28 MIL/uL — ABNORMAL LOW (ref 3.87–5.11)
RDW: 16.7 % — AB (ref 11.5–15.5)
WBC: 7.5 10*3/uL (ref 4.0–10.5)

## 2016-06-26 LAB — MRSA PCR SCREENING: MRSA by PCR: NEGATIVE

## 2016-06-26 MED ORDER — LISINOPRIL 5 MG PO TABS
5.0000 mg | ORAL_TABLET | Freq: Every day | ORAL | Status: DC
  Administered 2016-06-27 – 2016-06-29 (×3): 5 mg via ORAL
  Filled 2016-06-26 (×4): qty 1

## 2016-06-26 MED ORDER — ONDANSETRON HCL 4 MG/2ML IJ SOLN: 4.0000 mg | Freq: Four times a day (QID) | INTRAMUSCULAR | Status: DC | PRN

## 2016-06-26 MED ORDER — FUROSEMIDE 20 MG PO TABS
20.0000 mg | ORAL_TABLET | Freq: Every day | ORAL | Status: DC
  Administered 2016-06-27 – 2016-06-30 (×4): 20 mg via ORAL
  Filled 2016-06-26 (×4): qty 1

## 2016-06-26 MED ORDER — CARVEDILOL 3.125 MG PO TABS
3.1250 mg | ORAL_TABLET | Freq: Two times a day (BID) | ORAL | Status: DC
  Administered 2016-06-27 – 2016-06-30 (×6): 3.125 mg via ORAL
  Filled 2016-06-26 (×6): qty 1

## 2016-06-26 MED ORDER — MESALAMINE 1.2 G PO TBEC
2.4000 g | DELAYED_RELEASE_TABLET | Freq: Every day | ORAL | Status: DC
  Administered 2016-06-27 – 2016-06-30 (×4): 2.4 g via ORAL
  Filled 2016-06-26 (×5): qty 2

## 2016-06-26 MED ORDER — ATORVASTATIN CALCIUM 40 MG PO TABS
40.0000 mg | ORAL_TABLET | Freq: Every day | ORAL | Status: DC
  Administered 2016-06-27 – 2016-06-30 (×4): 40 mg via ORAL
  Filled 2016-06-26 (×4): qty 1

## 2016-06-26 MED ORDER — SODIUM CHLORIDE 0.9% FLUSH
10.0000 mL | Freq: Two times a day (BID) | INTRAVENOUS | Status: DC
  Administered 2016-06-26: 10 mL

## 2016-06-26 MED ORDER — HYDROCODONE-ACETAMINOPHEN 5-325 MG PO TABS
1.0000 | ORAL_TABLET | ORAL | Status: DC | PRN
  Administered 2016-06-26 – 2016-06-27 (×2): 1 via ORAL
  Filled 2016-06-26 (×2): qty 1

## 2016-06-26 MED ORDER — RISAQUAD PO CAPS
1.0000 | ORAL_CAPSULE | Freq: Every day | ORAL | Status: DC
  Administered 2016-06-27 – 2016-06-30 (×4): 1 via ORAL
  Filled 2016-06-26 (×4): qty 1

## 2016-06-26 MED ORDER — DOXYCYCLINE HYCLATE 100 MG PO TABS
100.0000 mg | ORAL_TABLET | Freq: Two times a day (BID) | ORAL | Status: DC
  Administered 2016-06-27 – 2016-06-30 (×6): 100 mg via ORAL
  Filled 2016-06-26 (×6): qty 1

## 2016-06-26 MED ORDER — SODIUM CHLORIDE 0.9% FLUSH
10.0000 mL | INTRAVENOUS | Status: DC | PRN
  Administered 2016-06-27 – 2016-06-30 (×3): 10 mL
  Filled 2016-06-26 (×3): qty 40

## 2016-06-26 MED ORDER — TRAMADOL HCL 50 MG PO TABS
50.0000 mg | ORAL_TABLET | Freq: Four times a day (QID) | ORAL | Status: DC | PRN
  Administered 2016-06-26 – 2016-06-30 (×4): 50 mg via ORAL
  Filled 2016-06-26 (×5): qty 1

## 2016-06-26 MED ORDER — ENSURE ENLIVE PO LIQD
237.0000 mL | Freq: Three times a day (TID) | ORAL | Status: DC
  Administered 2016-06-28 – 2016-06-29 (×4): 237 mL via ORAL

## 2016-06-26 MED ORDER — PANTOPRAZOLE SODIUM 40 MG PO TBEC
40.0000 mg | DELAYED_RELEASE_TABLET | Freq: Every day | ORAL | Status: DC
  Administered 2016-06-27 – 2016-06-30 (×4): 40 mg via ORAL
  Filled 2016-06-26 (×4): qty 1

## 2016-06-26 MED ORDER — ASPIRIN EC 81 MG PO TBEC
81.0000 mg | DELAYED_RELEASE_TABLET | Freq: Every day | ORAL | Status: DC
  Administered 2016-06-26 – 2016-06-29 (×4): 81 mg via ORAL
  Filled 2016-06-26 (×5): qty 1

## 2016-06-26 MED ORDER — ADULT MULTIVITAMIN W/MINERALS CH
1.0000 | ORAL_TABLET | Freq: Every day | ORAL | Status: DC
  Administered 2016-06-26 – 2016-06-30 (×5): 1 via ORAL
  Filled 2016-06-26 (×5): qty 1

## 2016-06-26 MED ORDER — POTASSIUM CHLORIDE CRYS ER 20 MEQ PO TBCR
20.0000 meq | EXTENDED_RELEASE_TABLET | Freq: Every day | ORAL | Status: DC
  Administered 2016-06-27 – 2016-06-30 (×4): 20 meq via ORAL
  Filled 2016-06-26 (×4): qty 1

## 2016-06-26 MED ORDER — RIFAMPIN 300 MG PO CAPS
600.0000 mg | ORAL_CAPSULE | Freq: Every day | ORAL | Status: DC
  Administered 2016-06-26 – 2016-06-30 (×5): 600 mg via ORAL
  Filled 2016-06-26 (×5): qty 2

## 2016-06-26 MED ORDER — MOMETASONE FURO-FORMOTEROL FUM 200-5 MCG/ACT IN AERO
2.0000 | INHALATION_SPRAY | Freq: Two times a day (BID) | RESPIRATORY_TRACT | Status: DC
  Administered 2016-06-28 – 2016-06-30 (×4): 2 via RESPIRATORY_TRACT
  Filled 2016-06-26: qty 8.8

## 2016-06-26 NOTE — Progress Notes (Signed)
IV team stated theres's an exposure of the catheter of the PICC line.  Notified the no call NP K. Schorr for possible order for x ray of the PICC line for placement.  Will continue to monitor

## 2016-06-26 NOTE — Assessment & Plan Note (Signed)
Her initial course of antibiotic therapy for MRSA infection of her left prosthetic knee was inadvertently cut short at 3 months. She then developed MRSA wound infection requiring incision and drainage in October. Although it was not clear that the joint was infected at that time I'm certainly concerned about chronic, smoldering prosthetic joint infection. She seemed to be improving prior to her recent fall. She is going to be evaluated by her orthopedic surgeon, Dr. Sharol Given, this afternoon. If he is in agreement I would recommend stopping her vancomycin tomorrow and having the PICC removed. I would then restart oral doxycycline along with rifampin. I discussed the possibility of chronic suppressive oral antibiotic therapy with her. This decision can be reevaluated at each visit. She will follow-up here in 6 weeks.

## 2016-06-26 NOTE — Progress Notes (Signed)
Office Visit Note   Patient: [name]           Date of Birth: 1941/06/01           MRN: [id number] Visit Date: 06/26/2016              Requested by: Lelon Perla, MD [address] [place], [place] [address] PCP: Kirk Ruths, MD   Assessment & Plan: Visit Diagnoses:  1. Pain in left hip   2. Left displaced femoral neck fracture ([place])     Plan: X-rays were reviewed with the patient and the family. Plan is to admit her to hospitalist overnight and surgery tomorrow for left total hip replacement as patient already has degenerative joint disease at baseline. She understands risks benefits alternatives to surgery.  Follow-Up Instructions: No Follow-up on file.   Orders:  Orders Placed This Encounter  Procedures  . XR HIP UNILAT W OR W/O PELVIS 2-3 VIEWS LEFT   No orders of the defined types were placed in this encounter.     Procedures: No procedures performed   Clinical Data: No additional findings.   Subjective: No chief complaint on file.   Patient is coming in today for left hip pain status post mechanical fall earlier this weekend. She went to the ER and stated her knee was hurting but x-rays were negative. She comes in today with significant pain coming from her left hip and inability to weight-bear. X-rays revealed a displaced left femoral neck fracture. Dr. Sharol Given asked me to see the patient and evaluate for surgical treatment. Patient did just finish IV antibiotics for a periprosthetic knee infection and is supposed to go on oral antibiotics for another 6 months or so.    Review of Systems  Constitutional: Negative.   HENT: Negative.   Eyes: Negative.   Respiratory: Negative.   Cardiovascular: Negative.   Endocrine: Negative.   Musculoskeletal: Negative.   Neurological: Negative.   Hematological: Negative.   Psychiatric/Behavioral: Negative.   All other systems reviewed and are negative.    Objective: Vital Signs: There were  no vitals taken for this visit.  Physical Exam  Constitutional: She is oriented to person, place, and time. She appears well-developed and well-nourished.  HENT:  Head: Atraumatic.  Eyes: EOM are normal.  Neck: Neck supple.  Cardiovascular: Intact distal pulses.   Pulmonary/Chest: Effort normal.  Abdominal: Soft.  Neurological: She is alert and oriented to person, place, and time.  Skin: Skin is warm. Capillary refill takes less than 2 seconds.  Psychiatric: She has a normal mood and affect. Her behavior is normal. Judgment and thought content normal.  Nursing note and vitals reviewed.   Ortho Exam Exam of the left hip shows very painful range of motion of the hip. The extremity is neurovascularly intact. Specialty Comments:  No specialty comments available.  Imaging: Xr Hip Unilat W Or W/o Pelvis 2-3 Views Left  Result Date: 06/26/2016 Displaced left femoral neck fracture.    PMFS History: Patient Active Problem List   Diagnosis Date Noted  . Left displaced femoral neck fracture ([place]) 06/26/2016  . Hypertensive heart disease   . Herpetic lesions 01/28/2016  . Hydronephrosis   . Acute on chronic systolic heart failure ([place])   . Obstructive uropathy   . Hypokalemia   . Elevated troponin 01/23/2016  . Abnormal echocardiogram 01/23/2016  . Hypoxia   . Pressure ulcer 01/21/2016  . Pyonephrosis   . Hypotension 12/28/2015  . Open knee wound  11/16/2015  . Wound dehiscence, surgical 10/18/2015  . Chronic infection of prosthetic knee ([place]) 10/12/2015  . Total knee replacement status 09/28/2015  . Bruit 07/10/2012  . Leukocytosis 09/19/2011  . Encephalopathy 09/19/2011  . AAA 01/19/2009  . ILIAC ARTERY ANEURYSM 01/19/2009  . Hyperlipidemia 01/14/2009  . TOBACCO ABUSE 01/14/2009  . Essential hypertension 01/14/2009  . Coronary atherosclerosis 01/14/2009  . Cerebrovascular disease 01/14/2009  . Peripheral vascular disease ([place]) 01/14/2009  . CHRONIC OBSTRUCTIVE  PULMONARY DISEASE 01/14/2009  . CHEST PAIN-UNSPECIFIED 01/14/2009   Past Medical History:  Diagnosis Date  . Arthritis   . CAD (coronary artery disease)    a. 02/2007 s/p PCI/DES ot LCX and RCA;  b. 07/2014 MV: no ischemia/infarct, EF 68%.  . Carotid arterial disease ([place])    a. 05/2015 Carotid U/S: RICA 6-26%, LICA 94-85%, RECA 4-62%, LECA >50% - overall stable, f/u 1 yr.  . Complication of anesthesia    Three days after procedure pt statrted to suffer with memoy loss that took about 4-5 days to come back according to [name]."  . Dilated cardiomyopathy ([place])    a. 05/2005 Echo: EF 65%; b. 01/22/2016 Echo: EF 25-30% (in setting of admission for urosepsis w/ elev trop); c. 01/26/2016 Echo: EF 35-40%, inf, distal apical, apical, and mid to distal septal HK-->felt to be 2/2 stress induced CM.  [name] Heart murmur   . History of bronchitis   . Hyperlipidemia   . Hypertensive heart disease   . Memory impairment   . MRSA (methicillin resistant staph aureus) culture positive   . Nephrolithiasis    a. 12/2015 UVJ stone w/ hydroureteronephrosis req nephrostomy tube placement.  . Osteoarthritis    a. 01/349 s/p L TKA complicated by infections req skin grafting.  [name] PVD (peripheral vascular disease) ([place])    a. Bilat common iliac dzs, nl ABI's --> med rx.  . Sepsis ([place])    a. 12/2015 admitted with Proteus mirabilis urosepsis/bacteremia.  . Wears glasses     Family History  Problem Relation Age of Onset  . Breast cancer Mother   . Leukemia Father   . Stroke Sister   . Bone cancer      Past Surgical History:  Procedure Laterality Date  . ABDOMINAL HYSTERECTOMY    . APPLICATION OF A-CELL OF EXTREMITY Left 12/08/2015   Procedure: APPLICATION OF A-CELL OF knee;  Surgeon: Loel Lofty [name], DO;  Location: [facility];  Service: Plastics;  Laterality: Left;  . APPLICATION OF WOUND VAC Left 11/16/2015   Procedure: APPLICATION OF WOUND VAC;  Surgeon: Loel Lofty [name], DO;  Location: [facility];  Service:  Plastics;  Laterality: Left;  . APPLICATION OF WOUND VAC Left 12/08/2015   Procedure: APPLICATION OF WOUND VAC  AND CAST to knee;  Surgeon: Loel Lofty [name], DO;  Location: [facility];  Service: Plastics;  Laterality: Left;  . COLONOSCOPY    . CORONARY ANGIOPLASTY  07   3 stents placed  . ESOPHAGOGASTRODUODENOSCOPY    . EYE SURGERY Bilateral    cataracts  . FREE FLAP TO EXTREMITY Left 11/16/2015   Procedure: FREE FLAP TO EXTREMITY;  Surgeon: Loel Lofty [name], DO;  Location: [facility];  Service: Plastics;  Laterality: Left;  . I&D EXTREMITY Left 10/18/2015   Procedure: Left Knee Washout, Reclosure;  Surgeon: Newt Minion, MD;  Location: [facility];  Service: Orthopedics;  Laterality: Left;  . I&D EXTREMITY Left 12/08/2015   Procedure: IRRIGATION AND DEBRIDEMENT knee;  Surgeon: Loel Lofty [name], DO;  Location: [facility];  Service: Plastics;  Laterality: Left;  . I&D KNEE WITH POLY EXCHANGE Left 10/12/2015   Procedure: Irrigation and Debridement , Poly Exchange, Antibiotic Beads Left Knee;  Surgeon: Newt Minion, MD;  Location: [facility];  Service: Orthopedics;  Laterality: Left;  . INCISION AND DRAINAGE OF WOUND Left 01/04/2016   Procedure: DEBRIDEMENT OF LEFT KNEE WOUND WITH ACELL AND WOUND VAC PLACEMENT;  Surgeon: Loel Lofty [name], DO;  Location: [facility];  Service: Plastics;  Laterality: Left;  . IR GENERIC HISTORICAL  05/11/2016   IR FLUORO GUIDE CV MIDLINE PICC RIGHT 05/11/2016 Sandi Mariscal, MD [facility]  . IR GENERIC HISTORICAL  05/11/2016   IR US GUIDE VASC ACCESS RIGHT 05/11/2016 Sandi Mariscal, MD [facility]  . [place]     denies  . SKIN SPLIT GRAFT Left 11/16/2015   Procedure: LEFT GASTROC MUSCLE FLAP WITH SKIN GRAFT SPLIT THICKNESS AND VAC PLACEMENT;  Surgeon: Loel Lofty [name], DO;  Location: [facility];  Service: Plastics;  Laterality: Left;  . TOTAL HIP ARTHROPLASTY Right 09/10/2012   Dr Sharol Given  . TOTAL HIP ARTHROPLASTY Right 09/10/2012   Procedure: TOTAL HIP ARTHROPLASTY;  Surgeon: Newt Minion, MD;  Location: [facility];  Service: Orthopedics;  Laterality: Right;  Right Total Hip Arthroplasty  . TOTAL KNEE ARTHROPLASTY Left 09/28/2015   Procedure: TOTAL KNEE ARTHROPLASTY;  Surgeon: Newt Minion, MD;  Location: [facility];  Service: Orthopedics;  Laterality: Left;   Social History   Occupational History  . Retired    Social History Main Topics  . Smoking status: Former Smoker    Packs/day: 0.50    Years: 50.00    Types: Cigarettes    Quit date: 09/28/2015  . Smokeless tobacco: Never Used     Comment: quit 09/27/15  . Alcohol use No  . Drug use: No  . Sexual activity: No

## 2016-06-26 NOTE — Progress Notes (Signed)
Pt arrived at the unit at 1800 with family. Admission MD. Notified about pt. Arrival. Called received from admission officer that admission process is being done.

## 2016-06-26 NOTE — Progress Notes (Signed)
[facility] for Infectious Disease  Patient Active Problem List   Diagnosis Date Noted  . Chronic infection of prosthetic knee ([place]) 10/12/2015    Priority: High  . Wound dehiscence, surgical 10/18/2015    Priority: Medium  . Total knee replacement status 09/28/2015    Priority: Medium  . Hypertensive heart disease   . Herpetic lesions 01/28/2016  . Hydronephrosis   . Acute on chronic systolic heart failure ([place])   . Obstructive uropathy   . Hypokalemia   . Elevated troponin 01/23/2016  . Abnormal echocardiogram 01/23/2016  . Hypoxia   . Pressure ulcer 01/21/2016  . Pyonephrosis   . Hypotension 12/28/2015  . Open knee wound 11/16/2015  . Bruit 07/10/2012  . Leukocytosis 09/19/2011  . Encephalopathy 09/19/2011  . AAA 01/19/2009  . ILIAC ARTERY ANEURYSM 01/19/2009  . Hyperlipidemia 01/14/2009  . TOBACCO ABUSE 01/14/2009  . Essential hypertension 01/14/2009  . Coronary atherosclerosis 01/14/2009  . Cerebrovascular disease 01/14/2009  . Peripheral vascular disease ([place]) 01/14/2009  . CHRONIC OBSTRUCTIVE PULMONARY DISEASE 01/14/2009  . CHEST PAIN-UNSPECIFIED 01/14/2009    Patient's Medications  New Prescriptions   No medications on file  Previous Medications   ACIDOPHILUS (RISAQUAD) CAPS CAPSULE    Take 1 capsule by mouth daily.   ASPIRIN EC 81 MG TABLET    Take 81 mg by mouth daily.   ATORVASTATIN (LIPITOR) 40 MG TABLET    Take 1 tablet (40 mg total) by mouth daily.   BUDESONIDE-FORMOTEROL (SYMBICORT) 160-4.5 MCG/ACT INHALER    Inhale 2 puffs into the lungs 2 (two) times daily.   CARVEDILOL (COREG) 3.125 MG TABLET    Take 1 tablet (3.125 mg total) by mouth 2 (two) times daily with a meal.   COLCHICINE 0.6 MG TABLET    TAKE ONE TO TWO TABLETS DAILY AS NEEDED FOR GOUT.   FEEDING SUPPLEMENT, ENSURE ENLIVE, (ENSURE ENLIVE) LIQD    Take 237 mLs by mouth 3 (three) times daily between meals.   FUROSEMIDE (LASIX) 20 MG TABLET    Take 1 tablet (20 mg total) by  mouth daily.   HYDROCODONE-ACETAMINOPHEN (NORCO/VICODIN) 5-325 MG TABLET    Take 1 tablet by mouth every 4 (four) hours as needed.   LISINOPRIL (PRINIVIL,ZESTRIL) 5 MG TABLET    Take 1 tablet (5 mg total) by mouth daily.   MESALAMINE (LIALDA) 1.2 G EC TABLET    Take 2.4 g by mouth daily with breakfast.   MULTIPLE VITAMIN (MULITIVITAMIN WITH MINERALS) TABS    Take 1 tablet by mouth daily.   PANTOPRAZOLE (PROTONIX) 40 MG TABLET    Take 40 mg by mouth daily.   POTASSIUM CHLORIDE SA (K-DUR,KLOR-CON) 20 MEQ TABLET    Take 1 tablet (20 mEq total) by mouth daily.   RIFAMPIN (RIFADIN) 300 MG CAPSULE    Take 2 capsules (600 mg total) by mouth daily.   VANCOMYCIN (VANCOCIN) 10 G SOLR INJECTION    Inject 10 g into the vein 2 (two) times daily.  Modified Medications   No medications on file  Discontinued Medications   No medications on file    Subjective: [name] is a 75 y.o. female who underwent left total knee arthroplasty on 09/28/2015. Very soon after her surgery she developed signs of acute infection and underwent incision and drainage with poly-exchange on 10/12/2015. Operative cultures grew MRSA. She was discharged before culture results were available on vancomycin and levofloxacin. She underwent a second incision and drainage procedure for  wound dehiscence on 10/18/2015. She required a lateral gastrocnemius muscle flap repair on 11/16/2015. She followed up with my partner in clinic in April and was switched to oral doxycycline with the intention of continuing that for up to 6 months total. She was in and out of several skilled nursing facilities at this time. She had 2 more procedures done on her knee in May and then again in June for skin grafting. She was readmitted to the hospital on 01/20/2016 with sepsis and Proteus bacteremia from a urinary source. Her admission notes indicates that she was on doxycycline at that time. However doxycycline was stopped during that hospitalization and it was not  continued upon discharge. She missed her follow-up visit here in clinic. In early October she developed sudden onset of severe pain and redness in her left knee. She was seen back in the office by Dr. Sharol Given who noted a small area of cellulitis approximate 2 cm in diameter over the patellar tendon. He incised and drained a small abscess which has grown MRSA again. A PICC line was placed and she was started back on IV vancomycin and rifampin was added. She has now completed 46 days of antibiotic therapy.   She is in for her routine follow-up with her son and daughter today. She was making progress and was able to do more walking with the aid of her walker until she fell when trying to get out of a chair on 06/22/2016. She believes she fell right on to her left knee. She has had new and more severe pain extending from her left knee up into her left groin since that time. She was seen in the emergency department. X-rays of her left knee did not show any acute abnormalities. Her hip was not x-rayed. She has not been able to stand and walk since then because of pain.  She's had no problems tolerating her PICC or antibiotics. She has had no fever, chills or sweats. Her appetite is very good.  Review of Systems: Review of Systems  Constitutional: Negative for chills, diaphoresis, fever and weight loss.  Gastrointestinal: Negative for abdominal pain, diarrhea, nausea and vomiting.  Musculoskeletal: Positive for joint pain.    Past Medical History:  Diagnosis Date  . Arthritis   . CAD (coronary artery disease)    a. 02/2007 s/p PCI/DES ot LCX and RCA;  b. 07/2014 MV: no ischemia/infarct, EF 68%.  . Carotid arterial disease ([place])    a. 05/2015 Carotid U/S: RICA 1-91%, LICA 47-82%, RECA 9-56%, LECA >50% - overall stable, f/u 1 yr.  . Complication of anesthesia    Three days after procedure pt statrted to suffer with memoy loss that took about 4-5 days to come back according to pt son [name]."  . Dilated  cardiomyopathy ([place])    a. 05/2005 Echo: EF 65%; b. 01/22/2016 Echo: EF 25-30% (in setting of admission for urosepsis w/ elev trop); c. 01/26/2016 Echo: EF 35-40%, inf, distal apical, apical, and mid to distal septal HK-->felt to be 2/2 stress induced CM.  [name] Heart murmur   . History of bronchitis   . Hyperlipidemia   . Hypertensive heart disease   . Memory impairment   . MRSA (methicillin resistant staph aureus) culture positive   . Nephrolithiasis    a. 12/2015 UVJ stone w/ hydroureteronephrosis req nephrostomy tube placement.  . Osteoarthritis    a. 08/1306 s/p L TKA complicated by infections req skin grafting.  [name] PVD (peripheral vascular disease) ([place])  a. Bilat common iliac dzs, nl ABI's --> med rx.  . Sepsis ([place])    a. 12/2015 admitted with Proteus mirabilis urosepsis/bacteremia.  . Wears glasses     Social History  Substance Use Topics  . Smoking status: Former Smoker    Packs/day: 0.50    Years: 50.00    Types: Cigarettes    Quit date: 09/28/2015  . Smokeless tobacco: Never Used     Comment: quit 09/27/15  . Alcohol use No    Family History  Problem Relation Age of Onset  . Breast cancer Mother   . Leukemia Father   . Stroke Sister   . Bone cancer      No Known Allergies  Objective: Vitals:   06/26/16 1338  BP: 126/75  Pulse: 91   There is no height or weight on file to calculate BMI.  Physical Exam  Constitutional: She is oriented to person, place, and time. No distress.  She is seated in a wheelchair.  Neurological: She is alert and oriented to person, place, and time.  Skin: No rash noted.  Right arm PICC site looks good. There is a small, chronic scabbed area over her left knee incision. She believes that scab was knocked off when she fell recently. There is a new linear scab just proximal to the wound. There is slight erythema surrounding the scabs.  Psychiatric: Mood and affect normal.    Lab Results Vancomycin trough level 06/22/2016: 16.3    Creatinine 06/22/2016: 0.67 C-reactive protein 06/18/2016: 32.6 Sedimentation rate 06/18/2016: 74   Problem List Items Addressed This Visit      High   Chronic infection of prosthetic knee ([facility])    Her initial course of antibiotic therapy for MRSA infection of her left prosthetic knee was inadvertently cut short at 3 months. She then developed MRSA wound infection requiring incision and drainage in October. Although it was not clear that the joint was infected at that time I'm certainly concerned about chronic, smoldering prosthetic joint infection. She seemed to be improving prior to her recent fall. She is going to be evaluated by her orthopedic surgeon, Dr. Sharol Given, this afternoon. If he is in agreement I would recommend stopping her vancomycin tomorrow and having the PICC removed. I would then restart oral doxycycline along with rifampin. I discussed the possibility of chronic suppressive oral antibiotic therapy with her. This decision can be reevaluated at each visit. She will follow-up here in 6 weeks.          Michel Bickers, MD [facility] [phone] pager   [phone] cell 06/26/2016, 2:18 PM

## 2016-06-26 NOTE — H&P (Signed)
History and Physical    [name] ZOX:[id number] DOB: 1941-02-10 DOA: 06/26/2016  PCP: Lamar Blinks, MD  Ortho: Freddi Che IDMegan Salon Cardiology: [name]  Patient coming from: Dr. Phoebe Sharps office  Chief Complaint: Left hip pain  HPI: [name] is a 75 y.o. woman with a history of CAD S/P 3 stents, HTN, HLD, and carotid artery disease.  She has a complicated orthopedic history that began in March with total knee replacement on the left.  This procedure was complicated by MRSA infection.  She has required multiple surgeries since then, including multiple I and D's, a lateral gastrocnemius muscle flap repair, and a skin graft.  She has been on multiple rounds of antibiotics.  Most recently, she has been on IV vancomycin for the past six weeks (followed by Dr. Megan Salon).  She has been in and out of SNFs this years but had actually improved to the point where she was back in her own home(a grandson lives with her and she has had home health support) and ambulating with a walker, until she fell on on 11/24.  She presented to the ED.  She complained of knee pain, but xrays of her left knee did not show acute fracture or dislocation.  She was discharged in stable condition with plans to follow up in ortho clinic.  Today in clinic, she complained of progressive left hip pain and ambulatory dysfunction (she reports that she has essentially been wheelchair bound since her fall).  She was referred for imaging of her left hip, which shows a displaced fracture of the left femoral neck.  Hospitalist asked to facilitate direct admission.  She is accompanied by her son and DIL tonight.  She maintains that her fall on 11/24 was accidental.  She denies any unexplained chest pain, pressure, or tightness.  She denies shortness of breath, palpitations, or leg swelling.    Her last hospitalization was in June.  She was admitted to the hospital in [facility] for what sounds like an acute GI bleed related to a  gastric ulcer.  She required a blood transfusion.    Regarding her MRSA infection of her left knee prosthesis, she also saw Dr. Megan Salon in clinic today, prior to this admission.  The plan is to stop IV vancomycin now and start therapy with oral rifampin and doxycycline.    Of note, the patient presents with a PICC that will need to be removed prior to discharge.   Review of Systems: As per HPI otherwise 10 point review of systems negative.    Past Medical History:  Diagnosis Date  . Arthritis   . CAD (coronary artery disease)    a. 02/2007 s/p PCI/DES ot LCX and RCA;  b. 07/2014 MV: no ischemia/infarct, EF 68%.  . Carotid arterial disease ([place])    a. 05/2015 Carotid U/S: RICA 8-11%, LICA 91-47%, RECA 8-29%, LECA >50% - overall stable, f/u 1 yr.  . Complication of anesthesia    Three days after procedure pt statrted to suffer with memoy loss that took about 4-5 days to come back according to [name]."  . Dilated cardiomyopathy ([place])    a. 05/2005 Echo: EF 65%; b. 01/22/2016 Echo: EF 25-30% (in setting of admission for urosepsis w/ elev trop); c. 01/26/2016 Echo: EF 35-40%, inf, distal apical, apical, and mid to distal septal HK-->felt to be 2/2 stress induced CM.  [name] Heart murmur   . History of bronchitis   . Hyperlipidemia   . Hypertensive heart disease   .  Memory impairment   . MRSA (methicillin resistant staph aureus) culture positive   . Nephrolithiasis    a. 12/2015 UVJ stone w/ hydroureteronephrosis req nephrostomy tube placement.  . Osteoarthritis    a. 02/4131 s/p L TKA complicated by infections req skin grafting.  [name] PVD (peripheral vascular disease) ([place])    a. Bilat common iliac dzs, nl ABI's --> med rx.  . Sepsis ([place])    a. 12/2015 admitted with Proteus mirabilis urosepsis/bacteremia.  . Wears glasses     Past Surgical History:  Procedure Laterality Date  . ABDOMINAL HYSTERECTOMY    . APPLICATION OF A-CELL OF EXTREMITY Left 12/08/2015   Procedure: APPLICATION OF  A-CELL OF knee;  Surgeon: Loel Lofty [name], DO;  Location: [place];  Service: Plastics;  Laterality: Left;  . APPLICATION OF WOUND VAC Left 11/16/2015   Procedure: APPLICATION OF WOUND VAC;  Surgeon: Loel Lofty [name], DO;  Location: [place];  Service: Plastics;  Laterality: Left;  . APPLICATION OF WOUND VAC Left 12/08/2015   Procedure: APPLICATION OF WOUND VAC  AND CAST to knee;  Surgeon: Loel Lofty [name], DO;  Location: [place];  Service: Plastics;  Laterality: Left;  . COLONOSCOPY    . CORONARY ANGIOPLASTY  07   3 stents placed  . ESOPHAGOGASTRODUODENOSCOPY    . EYE SURGERY Bilateral    cataracts  . FREE FLAP TO EXTREMITY Left 11/16/2015   Procedure: FREE FLAP TO EXTREMITY;  Surgeon: Loel Lofty [name], DO;  Location: [place];  Service: Plastics;  Laterality: Left;  . I&D EXTREMITY Left 10/18/2015   Procedure: Left Knee Washout, Reclosure;  Surgeon: Newt Minion, MD;  Location: [place];  Service: Orthopedics;  Laterality: Left;  . I&D EXTREMITY Left 12/08/2015   Procedure: IRRIGATION AND DEBRIDEMENT knee;  Surgeon: Loel Lofty [name], DO;  Location: [place];  Service: Plastics;  Laterality: Left;  . I&D KNEE WITH POLY EXCHANGE Left 10/12/2015   Procedure: Irrigation and Debridement , Poly Exchange, Antibiotic Beads Left Knee;  Surgeon: Newt Minion, MD;  Location: [place];  Service: Orthopedics;  Laterality: Left;  . INCISION AND DRAINAGE OF WOUND Left 01/04/2016   Procedure: DEBRIDEMENT OF LEFT KNEE WOUND WITH ACELL AND WOUND VAC PLACEMENT;  Surgeon: Loel Lofty [name], DO;  Location: [place];  Service: Plastics;  Laterality: Left;  . IR GENERIC HISTORICAL  05/11/2016   IR FLUORO GUIDE CV MIDLINE PICC RIGHT 05/11/2016 Sandi Mariscal, MD [facility]  . IR GENERIC HISTORICAL  05/11/2016   IR US GUIDE VASC ACCESS RIGHT 05/11/2016 Sandi Mariscal, MD [facility]  . [place]     denies  . SKIN SPLIT GRAFT Left 11/16/2015   Procedure: LEFT GASTROC MUSCLE FLAP WITH SKIN GRAFT SPLIT THICKNESS AND VAC  PLACEMENT;  Surgeon: Loel Lofty [name], DO;  Location: [place];  Service: Plastics;  Laterality: Left;  . TOTAL HIP ARTHROPLASTY Right 09/10/2012   Dr Sharol Given  . TOTAL HIP ARTHROPLASTY Right 09/10/2012   Procedure: TOTAL HIP ARTHROPLASTY;  Surgeon: Newt Minion, MD;  Location: [place];  Service: Orthopedics;  Laterality: Right;  Right Total Hip Arthroplasty  . TOTAL KNEE ARTHROPLASTY Left 09/28/2015   Procedure: TOTAL KNEE ARTHROPLASTY;  Surgeon: Newt Minion, MD;  Location: [place];  Service: Orthopedics;  Laterality: Left;     reports that she quit smoking about 8 months ago. Her smoking use included Cigarettes. She has a 25.00 pack-year smoking history. She has never used smokeless tobacco. She reports that she does not  drink alcohol or use drugs.  No Known Allergies  Family History  Problem Relation Age of Onset  . Breast cancer Mother   . Leukemia Father   . Stroke Sister   . Bone cancer       Prior to Admission medications   Medication Sig Start Date End Date Taking? Authorizing Provider  acidophilus (RISAQUAD) CAPS capsule Take 1 capsule by mouth daily.   Yes Historical Provider, MD  aspirin EC 81 MG tablet Take 81 mg by mouth daily.   Yes Historical Provider, MD  atorvastatin (LIPITOR) 40 MG tablet Take 1 tablet (40 mg total) by mouth daily. 06/19/16  Yes Lelon Perla, MD  budesonide-formoterol (SYMBICORT) 160-4.5 MCG/ACT inhaler Inhale 2 puffs into the lungs 2 (two) times daily.   Yes Historical Provider, MD  carvedilol (COREG) 3.125 MG tablet Take 1 tablet (3.125 mg total) by mouth 2 (two) times daily with a meal. 05/03/16  Yes Rogelia Mire, NP  colchicine 0.6 MG tablet TAKE ONE TO TWO TABLETS DAILY AS NEEDED FOR GOUT. 05/28/16  Yes Gerda Diss, DO  feeding supplement, ENSURE ENLIVE, (ENSURE ENLIVE) LIQD Take 237 mLs by mouth 3 (three) times daily between meals. 02/01/16  Yes Ripudeep Krystal Eaton, MD  furosemide (LASIX) 20 MG tablet Take 1 tablet (20 mg total) by mouth daily.  05/03/16  Yes Lelon Perla, MD  HYDROcodone-acetaminophen (NORCO/VICODIN) 5-325 MG tablet Take 1 tablet by mouth every 4 (four) hours as needed. 06/23/16  Yes Gibson Ramp, MD  lisinopril (PRINIVIL,ZESTRIL) 5 MG tablet Take 1 tablet (5 mg total) by mouth daily. 04/13/16 07/12/16 Yes Lelon Perla, MD  mesalamine (LIALDA) 1.2 g EC tablet Take 2.4 g by mouth daily with breakfast. 04/11/16 04/11/17 Yes Historical Provider, MD  Multiple Vitamin (MULITIVITAMIN WITH MINERALS) TABS Take 1 tablet by mouth daily.   Yes Historical Provider, MD  pantoprazole (PROTONIX) 40 MG tablet Take 40 mg by mouth daily.   Yes Historical Provider, MD  potassium chloride SA (K-DUR,KLOR-CON) 20 MEQ tablet Take 1 tablet (20 mEq total) by mouth daily. 05/03/16  Yes Lelon Perla, MD  rifampin (RIFADIN) 300 MG capsule Take 2 capsules (600 mg total) by mouth daily. 05/29/16  Yes Michel Bickers, MD  vancomycin [facility]) 10 G SOLR injection Inject 10 g into the vein 2 (two) times daily.   Yes Historical Provider, MD    Physical Exam: Vitals:   06/26/16 2040 06/26/16 2226 06/26/16 2335 06/27/16 0048  BP: (!) 157/45 (!) 142/56 127/66 (!) 137/58  Pulse: 91 97 99 89  Resp: '17 16 16 16  '$ Temp: 97.8 F (36.6 C) 97.7 F (36.5 C) 98.2 F (36.8 C)   TempSrc: Oral Oral Oral   SpO2: 98% 98% 96% 100%  Weight: 52 kg (114 lb 9.6 oz)     Height: '5\' 2"'$  (1.575 m)         Constitutional: NAD, calm, comfortable as long as she does not move. Vitals:   06/26/16 2040 06/26/16 2226 06/26/16 2335 06/27/16 0048  BP: (!) 157/45 (!) 142/56 127/66 (!) 137/58  Pulse: 91 97 99 89  Resp: '17 16 16 16  '$ Temp: 97.8 F (36.6 C) 97.7 F (36.5 C) 98.2 F (36.8 C)   TempSrc: Oral Oral Oral   SpO2: 98% 98% 96% 100%  Weight: 52 kg (114 lb 9.6 oz)     Height: '5\' 2"'$  (1.575 m)      Eyes: PERRL, lids and conjunctivae normal ENMT: Mucous membranes are  moist. Posterior pharynx clear of any exudate or lesions. Normal dentition.  Neck: normal  appearance, supple Respiratory: clear to auscultation bilaterally, no wheezing, no crackles. Normal respiratory effort. No accessory muscle use.  Cardiovascular: Normal rate, regular rhythm, no murmurs / rubs / gallops. No extremity edema. 2+ pedal pulses.  GI: abdomen is soft and compressible.  No distention.  No tenderness.  Bowel sounds are present. Musculoskeletal:  No joint deformity in upper extremities. ROM reduced in left lower extremity due to pain.  No contractures. Normal muscle tone.  Skin: no rashes, warm and dry Neurologic: No focal deficits. Psychiatric: Normal judgment and insight. Alert and oriented x 3. Normal mood.     Labs on Admission: I have personally reviewed following labs and imaging studies  CBC:  Recent Labs Lab 06/22/16 2225  WBC 13.4*  NEUTROABS 10.8*  HGB 11.6*  HCT 35.7*  MCV 90.6  PLT 641   Basic Metabolic Panel:  Recent Labs Lab 06/22/16 2225  NA 135  K 4.2  CL 103  CO2 25  GLUCOSE 133*  BUN 17  CREATININE 0.67  CALCIUM 9.1   GFR: Estimated Creatinine Clearance: 48.1 mL/min (by C-G formula based on SCr of 0.67 mg/dL).   Radiological Exams on Admission: Xr Hip Unilat W Or W/o Pelvis 2-3 Views Left  Result Date: 06/26/2016 Displaced left femoral neck fracture.   EKG: From 11/24, mild sinus tachycardia.  No acute ST segment changes.  Assessment/Plan Principal Problem:   Left displaced femoral neck fracture (HCC) Active Problems:   Essential hypertension   Coronary atherosclerosis   MRSA (methicillin resistant Staphylococcus aureus) infection   Hip fracture (HCC)      Displaced left femoral neck fracture --Anticipate surgical intervention tomorrow per Dr. Erlinda Hong. --NPO after midnight --Analgesics as needed (the patient wants to try tramadol; she would like to avoid IV narcotics if possible) --SCDs for now  History of MRSA infection of left knee prosthesis --Continue oral rifampin and doxycycline --Consider notifying Dr.  Megan Salon of admission and need for additional surgery in the morning --PICC line will need to come out prior to discharge --Contact precautions  HTN --Continue Coreg, lasix, lisinopril  History of CAD --Continue baby aspirin, BB, ACE-I, statin   DVT prophylaxis: SCDs Code Status: DNR/DNI Family Communication: Patient's son and daughter-in-law present in the room at time of admission. Disposition Plan: Expect she will need short term SNF placement post-operatively. Consults called: Orthopedic surgery Admission status: Inpatient, med surg   TIME SPENT: 53 mintues   Eber Jones MD [facility] Pager [phone]  If 7PM-7AM, please contact night-coverage [email or web address] Password [facility]  06/26/2016, 8:26 PM

## 2016-06-26 NOTE — Progress Notes (Signed)
Patient will be admitted this evening for hemiarthroplasty tomorrow for her left femoral neck fracture. We will leave her PICC line in through her hospitalization and discontinue this after her hospitalization. I agree with continued suppressive oral antibiotic therapy.

## 2016-06-27 ENCOUNTER — Inpatient Hospital Stay (HOSPITAL_COMMUNITY): Payer: Medicare Other | Admitting: Anesthesiology

## 2016-06-27 ENCOUNTER — Encounter (HOSPITAL_COMMUNITY)
Admission: AD | Disposition: A | Discharge status: Skilled Nursing Facility | Payer: Self-pay | Source: Ambulatory Visit | Attending: Internal Medicine

## 2016-06-27 ENCOUNTER — Telehealth: Payer: Self-pay | Admitting: Internal Medicine

## 2016-06-27 ENCOUNTER — Inpatient Hospital Stay (HOSPITAL_COMMUNITY): Payer: Medicare Other

## 2016-06-27 ENCOUNTER — Encounter (HOSPITAL_COMMUNITY): Payer: Self-pay | Admitting: Anesthesiology

## 2016-06-27 DIAGNOSIS — L8990 Pressure ulcer of unspecified site, unspecified stage: Secondary | ICD-10-CM | POA: Insufficient documentation

## 2016-06-27 DIAGNOSIS — S72002A Fracture of unspecified part of neck of left femur, initial encounter for closed fracture: Principal | ICD-10-CM

## 2016-06-27 DIAGNOSIS — I10 Essential (primary) hypertension: Secondary | ICD-10-CM

## 2016-06-27 DIAGNOSIS — M12551 Traumatic arthropathy, right hip: Secondary | ICD-10-CM

## 2016-06-27 DIAGNOSIS — A4902 Methicillin resistant Staphylococcus aureus infection, unspecified site: Secondary | ICD-10-CM

## 2016-06-27 HISTORY — PX: TOTAL HIP ARTHROPLASTY: SHX124

## 2016-06-27 LAB — SURGICAL PCR SCREEN
MRSA, PCR: NEGATIVE
STAPHYLOCOCCUS AUREUS: NEGATIVE

## 2016-06-27 SURGERY — ARTHROPLASTY, HIP, TOTAL, ANTERIOR APPROACH
Anesthesia: Spinal | Laterality: Left

## 2016-06-27 MED ORDER — PROPOFOL 500 MG/50ML IV EMUL
INTRAVENOUS | Status: DC | PRN
Start: 2016-06-27 — End: 2016-06-27
  Administered 2016-06-27: 50 ug/kg/min via INTRAVENOUS

## 2016-06-27 MED ORDER — ENOXAPARIN SODIUM 40 MG/0.4ML ~~LOC~~ SOLN: 40.0000 mg | Freq: Every day | SUBCUTANEOUS | 0 refills | Status: DC

## 2016-06-27 MED ORDER — CEFAZOLIN SODIUM-DEXTROSE 2-4 GM/100ML-% IV SOLN
2.0000 g | Freq: Four times a day (QID) | INTRAVENOUS | Status: AC
  Administered 2016-06-27 – 2016-06-28 (×3): 2 g via INTRAVENOUS
  Filled 2016-06-27 (×3): qty 100

## 2016-06-27 MED ORDER — HYDROCODONE-ACETAMINOPHEN 7.5-325 MG PO TABS: 1.0000 | ORAL_TABLET | Freq: Four times a day (QID) | ORAL | 0 refills | Status: DC | PRN

## 2016-06-27 MED ORDER — DEXTROSE 5 % IV SOLN
INTRAVENOUS | Status: DC | PRN
  Administered 2016-06-27: 20 ug/min via INTRAVENOUS

## 2016-06-27 MED ORDER — HYDROCODONE-ACETAMINOPHEN 5-325 MG PO TABS
1.0000 | ORAL_TABLET | Freq: Four times a day (QID) | ORAL | Status: DC | PRN
  Administered 2016-06-27 (×2): 1 via ORAL
  Administered 2016-06-28: 2 via ORAL
  Administered 2016-06-28: 1 via ORAL
  Administered 2016-06-28: 2 via ORAL
  Filled 2016-06-27 (×2): qty 1
  Filled 2016-06-27: qty 2
  Filled 2016-06-27: qty 1
  Filled 2016-06-27: qty 2
  Filled 2016-06-27: qty 1

## 2016-06-27 MED ORDER — ONDANSETRON HCL 4 MG PO TABS: 4.0000 mg | ORAL_TABLET | Freq: Four times a day (QID) | ORAL | Status: DC | PRN

## 2016-06-27 MED ORDER — METOCLOPRAMIDE HCL 5 MG/ML IJ SOLN: 5.0000 mg | Freq: Three times a day (TID) | INTRAMUSCULAR | Status: DC | PRN

## 2016-06-27 MED ORDER — FENTANYL CITRATE (PF) 100 MCG/2ML IJ SOLN
100.0000 ug | Freq: Once | INTRAMUSCULAR | Status: AC
Start: 2016-06-27 — End: 2016-06-27
  Administered 2016-06-27: 50 ug via INTRAVENOUS
  Filled 2016-06-27: qty 2

## 2016-06-27 MED ORDER — PROPOFOL 10 MG/ML IV BOLUS
INTRAVENOUS | Status: DC | PRN
  Administered 2016-06-27: 30 mg via INTRAVENOUS

## 2016-06-27 MED ORDER — KETAMINE HCL-SODIUM CHLORIDE 100-0.9 MG/10ML-% IV SOSY
PREFILLED_SYRINGE | INTRAVENOUS | Status: AC
  Filled 2016-06-27: qty 10

## 2016-06-27 MED ORDER — PHENYLEPHRINE HCL 10 MG/ML IJ SOLN
INTRAMUSCULAR | Status: DC | PRN
  Administered 2016-06-27 (×2): 80 ug via INTRAVENOUS

## 2016-06-27 MED ORDER — METHOCARBAMOL 500 MG PO TABS
500.0000 mg | ORAL_TABLET | Freq: Four times a day (QID) | ORAL | Status: DC | PRN
Start: 2016-06-27 — End: 2016-06-30

## 2016-06-27 MED ORDER — PHENOL 1.4 % MT LIQD: 1.0000 | OROMUCOSAL | Status: DC | PRN

## 2016-06-27 MED ORDER — METHOCARBAMOL 1000 MG/10ML IJ SOLN
500.0000 mg | Freq: Four times a day (QID) | INTRAVENOUS | Status: DC | PRN
  Filled 2016-06-27: qty 5

## 2016-06-27 MED ORDER — MENTHOL 3 MG MT LOZG: 1.0000 | LOZENGE | OROMUCOSAL | Status: DC | PRN

## 2016-06-27 MED ORDER — BUPIVACAINE IN DEXTROSE 0.75-8.25 % IT SOLN
INTRATHECAL | Status: DC | PRN
  Administered 2016-06-27: 1.8 mL via INTRATHECAL

## 2016-06-27 MED ORDER — SODIUM CHLORIDE 0.9 % IR SOLN
Status: DC | PRN
  Administered 2016-06-27: 3000 mL

## 2016-06-27 MED ORDER — SODIUM CHLORIDE 0.9 % IV SOLN
INTRAVENOUS | Status: DC
  Administered 2016-06-27 – 2016-06-29 (×2): via INTRAVENOUS

## 2016-06-27 MED ORDER — POVIDONE-IODINE 10 % EX SOLN
CUTANEOUS | Status: DC | PRN
  Administered 2016-06-27: 1 via TOPICAL

## 2016-06-27 MED ORDER — ALUM & MAG HYDROXIDE-SIMETH 200-200-20 MG/5ML PO SUSP: 30.0000 mL | ORAL | Status: DC | PRN

## 2016-06-27 MED ORDER — ACETAMINOPHEN 650 MG RE SUPP: 650.0000 mg | Freq: Four times a day (QID) | RECTAL | Status: DC | PRN

## 2016-06-27 MED ORDER — MIDAZOLAM HCL 2 MG/2ML IJ SOLN
INTRAMUSCULAR | Status: AC
  Filled 2016-06-27: qty 2

## 2016-06-27 MED ORDER — POVIDONE-IODINE 10 % EX SWAB
2.0000 "application " | Freq: Once | CUTANEOUS | Status: AC
  Administered 2016-06-27: 2 via TOPICAL

## 2016-06-27 MED ORDER — FENTANYL CITRATE (PF) 100 MCG/2ML IJ SOLN
INTRAMUSCULAR | Status: DC | PRN
  Administered 2016-06-27: 50 ug via INTRAVENOUS

## 2016-06-27 MED ORDER — TRANEXAMIC ACID 1000 MG/10ML IV SOLN: INTRAVENOUS | Status: DC | PRN

## 2016-06-27 MED ORDER — MEPERIDINE HCL 25 MG/ML IJ SOLN: 6.2500 mg | INTRAMUSCULAR | Status: DC | PRN

## 2016-06-27 MED ORDER — FENTANYL CITRATE (PF) 100 MCG/2ML IJ SOLN
INTRAMUSCULAR | Status: AC
  Filled 2016-06-27: qty 2

## 2016-06-27 MED ORDER — LIDOCAINE HCL (CARDIAC) 20 MG/ML IV SOLN
INTRAVENOUS | Status: DC | PRN
  Administered 2016-06-27: 20 mg via INTRAVENOUS

## 2016-06-27 MED ORDER — ONDANSETRON HCL 4 MG/2ML IJ SOLN
INTRAMUSCULAR | Status: DC | PRN
  Administered 2016-06-27: 4 mg via INTRAVENOUS

## 2016-06-27 MED ORDER — TRANEXAMIC ACID 1000 MG/10ML IV SOLN
2000.0000 mg | INTRAVENOUS | Status: AC
  Filled 2016-06-27: qty 20

## 2016-06-27 MED ORDER — ENOXAPARIN SODIUM 40 MG/0.4ML ~~LOC~~ SOLN
40.0000 mg | SUBCUTANEOUS | Status: DC
  Administered 2016-06-28 – 2016-06-29 (×2): 40 mg via SUBCUTANEOUS
  Filled 2016-06-27 (×2): qty 0.4

## 2016-06-27 MED ORDER — KETAMINE HCL 10 MG/ML IJ SOLN
INTRAMUSCULAR | Status: DC | PRN
  Administered 2016-06-27: 20 mg via INTRAVENOUS

## 2016-06-27 MED ORDER — CEFAZOLIN SODIUM-DEXTROSE 2-4 GM/100ML-% IV SOLN
2.0000 g | INTRAVENOUS | Status: AC
  Administered 2016-06-27: 2 g via INTRAVENOUS
  Filled 2016-06-27 (×2): qty 100

## 2016-06-27 MED ORDER — ONDANSETRON HCL 4 MG/2ML IJ SOLN
4.0000 mg | Freq: Four times a day (QID) | INTRAMUSCULAR | Status: DC | PRN
  Administered 2016-06-28: 4 mg via INTRAVENOUS
  Filled 2016-06-27 (×2): qty 2

## 2016-06-27 MED ORDER — ACETAMINOPHEN 325 MG PO TABS
650.0000 mg | ORAL_TABLET | Freq: Four times a day (QID) | ORAL | Status: DC | PRN
  Administered 2016-06-29: 650 mg via ORAL
  Filled 2016-06-27: qty 2

## 2016-06-27 MED ORDER — 0.9 % SODIUM CHLORIDE (POUR BTL) OPTIME
TOPICAL | Status: DC | PRN
  Administered 2016-06-27: 1000 mL

## 2016-06-27 MED ORDER — METOCLOPRAMIDE HCL 10 MG PO TABS: 5.0000 mg | ORAL_TABLET | Freq: Three times a day (TID) | ORAL | Status: DC | PRN

## 2016-06-27 MED ORDER — PROPOFOL 1000 MG/100ML IV EMUL
INTRAVENOUS | Status: AC
  Filled 2016-06-27: qty 200

## 2016-06-27 MED ORDER — MORPHINE SULFATE (PF) 2 MG/ML IV SOLN
0.5000 mg | INTRAVENOUS | Status: DC | PRN
  Administered 2016-06-28 (×2): 0.5 mg via INTRAVENOUS
  Filled 2016-06-27 (×2): qty 1

## 2016-06-27 MED ORDER — FENTANYL CITRATE (PF) 100 MCG/2ML IJ SOLN: 25.0000 ug | INTRAMUSCULAR | Status: DC | PRN

## 2016-06-27 MED ORDER — LACTATED RINGERS IV SOLN
INTRAVENOUS | Status: DC | PRN
  Administered 2016-06-27 (×2): via INTRAVENOUS

## 2016-06-27 MED ORDER — ONDANSETRON HCL 4 MG/2ML IJ SOLN: 4.0000 mg | Freq: Once | INTRAMUSCULAR | Status: DC | PRN

## 2016-06-27 MED ORDER — OXYCODONE HCL 5 MG PO TABS
5.0000 mg | ORAL_TABLET | ORAL | Status: DC | PRN
  Administered 2016-06-28: 5 mg via ORAL
  Administered 2016-06-29: 10 mg via ORAL
  Filled 2016-06-27: qty 2
  Filled 2016-06-27: qty 1

## 2016-06-27 SURGICAL SUPPLY — 56 items
ADH SKN CLS LQ APL DERMABOND (GAUZE/BANDAGES/DRESSINGS) ×1
BAG DECANTER FOR FLEXI CONT (MISCELLANEOUS) ×3 IMPLANT
CAPT HIP TOTAL 2 ×3 IMPLANT
CELLS DAT CNTRL 66122 CELL SVR (MISCELLANEOUS) ×1 IMPLANT
COVER SURGICAL LIGHT HANDLE (MISCELLANEOUS) ×3 IMPLANT
DERMABOND ADHESIVE PROPEN (GAUZE/BANDAGES/DRESSINGS) ×2
DERMABOND ADVANCED .7 DNX6 (GAUZE/BANDAGES/DRESSINGS) IMPLANT
DRAPE C-ARM 42X72 X-RAY (DRAPES) ×3 IMPLANT
DRAPE STERI IOBAN 125X83 (DRAPES) ×3 IMPLANT
DRAPE U-SHAPE 47X51 STRL (DRAPES) ×9 IMPLANT
DRESSING AQUACEL AG SP 3.5X10 (GAUZE/BANDAGES/DRESSINGS) IMPLANT
DRSG AQUACEL AG ADV 3.5X10 (GAUZE/BANDAGES/DRESSINGS) ×3 IMPLANT
DRSG AQUACEL AG SP 3.5X10 (GAUZE/BANDAGES/DRESSINGS) ×3
DRSG MEPILEX BORDER 4X8 (GAUZE/BANDAGES/DRESSINGS) ×2 IMPLANT
DURAPREP 26ML APPLICATOR (WOUND CARE) ×3 IMPLANT
ELECT BLADE 4.0 EZ CLEAN MEGAD (MISCELLANEOUS) ×3
ELECT REM PT RETURN 9FT ADLT (ELECTROSURGICAL) ×3
ELECTRODE BLDE 4.0 EZ CLN MEGD (MISCELLANEOUS) ×1 IMPLANT
ELECTRODE REM PT RTRN 9FT ADLT (ELECTROSURGICAL) ×1 IMPLANT
GLOVE SKINSENSE NS SZ7.5 (GLOVE) ×2
GLOVE SKINSENSE STRL SZ7.5 (GLOVE) ×1 IMPLANT
GLOVE SURG SYN 7.5  E (GLOVE) ×4
GLOVE SURG SYN 7.5 E (GLOVE) ×2 IMPLANT
GLOVE SURG SYN 7.5 PF PI (GLOVE) ×2 IMPLANT
GOWN SRG XL XLNG 56XLVL 4 (GOWN DISPOSABLE) ×1 IMPLANT
GOWN STRL NON-REIN XL XLG LVL4 (GOWN DISPOSABLE) ×3
GOWN STRL REUS W/ TWL LRG LVL3 (GOWN DISPOSABLE) ×3 IMPLANT
GOWN STRL REUS W/TWL LRG LVL3 (GOWN DISPOSABLE) ×9
HANDPIECE INTERPULSE COAX TIP (DISPOSABLE) ×3
HOOD PEEL AWAY FLYTE STAYCOOL (MISCELLANEOUS) ×6 IMPLANT
HOOD SURGICAL BLUE (PROTECTIVE WEAR) ×2 IMPLANT
IV NS 1000ML (IV SOLUTION) ×3
IV NS 1000ML BAXH (IV SOLUTION) ×1 IMPLANT
IV NS IRRIG 3000ML ARTHROMATIC (IV SOLUTION) ×3 IMPLANT
KIT BASIN OR (CUSTOM PROCEDURE TRAY) ×3 IMPLANT
MARKER SKIN DUAL TIP RULER LAB (MISCELLANEOUS) ×3 IMPLANT
NEEDLE SPNL 18GX3.5 QUINCKE PK (NEEDLE) ×3 IMPLANT
PACK TOTAL JOINT (CUSTOM PROCEDURE TRAY) ×3 IMPLANT
PACK UNIVERSAL I (CUSTOM PROCEDURE TRAY) ×3 IMPLANT
RETRACTOR WND ALEXIS 18 MED (MISCELLANEOUS) ×1 IMPLANT
RTRCTR WOUND ALEXIS 18CM MED (MISCELLANEOUS) ×3
SAW OSC TIP CART 19.5X105X1.3 (SAW) ×3 IMPLANT
SEALER BIPOLAR AQUA 6.0 (INSTRUMENTS) ×3 IMPLANT
SET HNDPC FAN SPRY TIP SCT (DISPOSABLE) ×1 IMPLANT
STAPLER VISISTAT 35W (STAPLE) IMPLANT
SUT ETHIBOND 2 V 37 (SUTURE) ×3 IMPLANT
SUT ETHIBOND NAB CT1 #1 30IN (SUTURE) ×9 IMPLANT
SUT VIC AB 1 CT1 27 (SUTURE) ×3
SUT VIC AB 1 CT1 27XBRD ANBCTR (SUTURE) ×1 IMPLANT
SUT VIC AB 2-0 CT1 27 (SUTURE) ×3
SUT VIC AB 2-0 CT1 TAPERPNT 27 (SUTURE) ×1 IMPLANT
SYR 20CC LL (SYRINGE) ×3 IMPLANT
SYR 50ML LL SCALE MARK (SYRINGE) ×3 IMPLANT
TOWEL OR 17X26 10 PK STRL BLUE (TOWEL DISPOSABLE) ×3 IMPLANT
TRAY CATH 16FR W/PLASTIC CATH (SET/KITS/TRAYS/PACK) ×3 IMPLANT
YANKAUER SUCT BULB TIP NO VENT (SUCTIONS) ×3 IMPLANT

## 2016-06-27 NOTE — Anesthesia Procedure Notes (Signed)
Spinal  Patient location during procedure: OR Staffing Anesthesiologist: Bradford Cazier, [place] Performed: anesthesiologist  Preanesthetic Checklist Completed: patient identified, site marked, surgical consent, pre-op evaluation, timeout performed, IV checked, risks and benefits discussed and monitors and equipment checked Spinal Block Patient position: right lateral decubitus Prep: Betadine Patient monitoring: heart rate, continuous pulse ox and blood pressure Location: L3-4 Injection technique: single-shot Needle Needle type: Spinocan  Needle gauge: 24 G Needle length: 9 cm Additional Notes Expiration date of kit checked and confirmed. Patient tolerated procedure well, without complications.

## 2016-06-27 NOTE — Anesthesia Preprocedure Evaluation (Signed)
Anesthesia Evaluation  Patient identified by MRN, date of birth, ID band Patient awake    Reviewed: Allergy & Precautions, NPO status , Patient's Chart, lab work & pertinent test results, reviewed documented beta blocker date and time   History of Anesthesia Complications (+) Emergence Delirium and history of anesthetic complications  Airway Mallampati: I  TM Distance: >3 FB Neck ROM: Full    Dental  (+) Edentulous Upper, Edentulous Lower   Pulmonary COPD, former smoker,    Pulmonary exam normal breath sounds clear to auscultation       Cardiovascular Exercise Tolerance: Poor hypertension, Pt. on medications and Pt. on home beta blockers + CAD, + Cardiac Stents and + Peripheral Vascular Disease  + Valvular Problems/Murmurs AI  Rhythm:Regular Rate:Normal + Diastolic murmurs    Neuro/Psych PSYCHIATRIC DISORDERS    GI/Hepatic negative GI ROS, Neg liver ROS,   Endo/Other  Hyperlipidemia  Renal/GU Renal InsufficiencyRenal disease  negative genitourinary   Musculoskeletal  (+) Arthritis , Osteoarthritis,  Fx left hip   Abdominal   Peds  Hematology  (+) anemia ,   Anesthesia Other Findings   Reproductive/Obstetrics                             Lab Results  Component Value Date   WBC 7.5 06/26/2016   HGB 9.4 (L) 06/26/2016   HCT 30.1 (L) 06/26/2016   MCV 91.8 06/26/2016   PLT 310 06/26/2016     Chemistry      Component Value Date/Time   NA 137 06/26/2016 2002   K 3.9 06/26/2016 2002   CL 104 06/26/2016 2002   CO2 26 06/26/2016 2002   BUN 21 (H) 06/26/2016 2002   CREATININE 0.68 06/26/2016 2002      Component Value Date/Time   CALCIUM 8.6 (L) 06/26/2016 2002   ALKPHOS 129 (H) 06/26/2016 2002   AST 47 (H) 06/26/2016 2002   ALT 47 06/26/2016 2002   BILITOT 0.6 06/26/2016 2002     EKG: sinus tachycardia.  Echo 04/17/2016:Left ventricle: The cavity size was normal. Wall thickness  was   normal. Systolic function was normal. The estimated ejection   fraction was in the range of 55% to 60%. Wall motion was normal;   there were no regional wall motion abnormalities. Doppler   parameters are consistent with abnormal left ventricular   relaxation (grade 1 diastolic dysfunction). - Aortic valve: There was mild regurgitation. - Mitral valve: Calcified annulus. Mildly thickened leaflets . - Left atrium: The atrium was mildly dilated.  Impressions:  - Normal LV systolic function; grade 1 diastolic dysfunction; mild   AI; mild LAE.  Anesthesia Physical Anesthesia Plan  ASA: III  Anesthesia Plan: Spinal   Post-op Pain Management:    Induction:   Airway Management Planned: Natural Airway and Nasal Cannula  Additional Equipment:   Intra-op Plan:   Post-operative Plan:   Informed Consent: I have reviewed the patients History and Physical, chart, labs and discussed the procedure including the risks, benefits and alternatives for the proposed anesthesia with the patient or authorized representative who has indicated his/her understanding and acceptance.     Plan Discussed with: Anesthesiologist, CRNA and Surgeon  Anesthesia Plan Comments:         Anesthesia Quick Evaluation

## 2016-06-27 NOTE — H&P (Signed)

## 2016-06-27 NOTE — Op Note (Signed)
LEFT TOTAL HIP ARTHROPLASTY ANTERIOR APPROACH  Procedure Note [name]   [id number]  Pre-op Diagnosis: left femoral neck fracture     Post-op Diagnosis: same   Operative Procedures  1. Total hip replacement; Left hip; uncemented [id number]   Personnel  Surgeon(s): Leandrew Koyanagi, MD   Anesthesia: spinal  Prosthesis: Depuy Acetabulum: Pinnacle 52 mm Femur: Corail KA 13 Head: 36 size: +5 Liner: +4 Bearing Type: metal on poly  Date of Service: 06/26/2016 - 06/27/2016  Total Hip Arthroplasty (Anterior Approach) Op Note:  After informed consent was obtained and the operative extremity marked in the holding area, the patient was brought back to the operating room and placed supine on the HANA table. Next, the operative extremity was prepped and draped in normal sterile fashion. Surgical timeout occurred verifying patient identification, surgical site, surgical procedure and administration of antibiotics.  A modified anterior Smith-Peterson approach to the hip was performed, using the interval between tensor fascia lata and sartorius.  Dissection was carried bluntly down onto the anterior hip capsule. The lateral femoral circumflex vessels were identified and coagulated. A capsulotomy was performed and the capsular flaps tagged for later repair.  Fluoroscopy was utilized to prepare for the femoral neck cut. The neck osteotomy was performed. The femoral head was removed, the acetabular rim was cleared of soft tissue and attention was turned to reaming the acetabulum.  Sequential reaming was performed under fluoroscopic guidance. We reamed to a size 51 mm, and then impacted the acetabular shell. The liner was then placed after irrigation and attention turned to the femur.  After placing the femoral hook, the leg was taken to externally rotated, extended and adducted position taking care to perform soft tissue releases to allow for adequate mobilization of the femur. Soft tissue was cleared  from the shoulder of the greater trochanter and the hook elevator used to improve exposure of the proximal femur. Sequential broaching performed up to a size 13. Trial neck and head were placed. The leg was brought back up to neutral and the construct reduced. The position and sizing of components, offset and leg lengths were checked using fluoroscopy. Stability of the  construct was checked in extension and external rotation without any subluxation or impingement of prosthesis. We dislocated the prosthesis, dropped the leg back into position, removed trial components, and irrigated copiously. The final stem and head was then placed, the leg brought back up, the system reduced and fluoroscopy used to verify positioning.  We irrigated, obtained hemostasis and closed the capsule using #2 ethibond suture.  Dilute betadyne solution was used. The fascia was closed with #1 vicryl plus, the deep fat layer was closed with 0 vicryl, the subcutaneous layers closed with 2.0 Vicryl Plus and the skin closed with staples. A sterile dressing was applied. The patient was awakened in the operating room and taken to recovery in stable condition.  All sponge, needle, and instrument counts were correct at the end of the case.   Position: supine  Complications: none.  Time Out: performed   Drains/Packing: none  Estimated blood loss: 200 cc  Returned to Recovery Room: in good condition.   Antibiotics: yes   Mechanical VTE (DVT) Prophylaxis: sequential compression devices, TED thigh-high  Chemical VTE (DVT) Prophylaxis: lovenox   Fluid Replacement: see anesthesia record  Specimens Removed: 1 to pathology   Sponge and Instrument Count Correct? yes   PACU: portable radiograph - low AP   Admission: inpatient status  Plan/RTC: Return in 2  weeks for staple removal. Weight Bearing/Load Lower Extremity: full  Hip precautions: none Suture Removal: 10-14 days  Betadine to incision twice daily once dressing is  removed on POD#7  N. Eduard Roux, MD [place] [phone] 6:24 PM      Implant Name Type Inv. Item Serial No. Manufacturer Lot No. LRB No. Used  PIN SECTOR W/GRIP ACE CUP 52MM - [id number] Hips PIN SECTOR W/GRIP ACE CUP 52MM  DEPUY [id number] Left 1  SCREW 6.5MMX25MM - [id number] Screw SCREW 6.5MMX25MM  DEPUY [id number] Left 1  LINER NEUTRAL 52X36MM PLUS 4 - [id number] Liner LINER NEUTRAL 52X36MM PLUS 4  DEPUY [id number] Left 1  STEM [facility] - [id number] Stem STEM CORAIL KA13  DEPUY [id number] Left 1  ARTICULEZE HEAD - [id number] Hips ARTICULEZE HEAD   DEPUY [id number] Left 1

## 2016-06-27 NOTE — Care Management Note (Addendum)
Case Management Note  Patient Details  Name: [name] MRN: [id number] Date of Birth: 06/29/1941  Subjective/Objective:          Admitted with L femoral fx , s/p fall. Hx of CAD S/P 3 stents, HTN, HLD, and carotid artery disease, s/p L total knee replacement 24/4628/ complicated by MRSA infection.  She has required multiple surgeries since then,  multiple I and D's, a lateral gastrocnemius muscle flap repair, and a skin graft, and multiple antibiotics. Pt with PICC line. She has been on IV vancomycin for the past six weeks (followed by Dr. Megan Salon).  From home with grandson, [name]. Pt states PTA was receiving home health services from [facility].       PCP: [name]  Action/Plan: CM consult received for equipment needs. Plan: surgery today, left total hip replacement .[name]... CM to f/u with disposition needs.  Expected Discharge Date:                  Expected Discharge Plan:  [place] (Lives with grandson, [place])  In-House Referral:  Clinical Social Work  Discharge planning Services  CM Consult  Post Acute Care Choice:    Choice offered to:   patient  DME Arranged:    DME Agency:     HH Arranged:    [place] Agency:   [facility]  Status of Service:  In process, will continue to follow  If discussed at Long Length of Stay Meetings, dates discussed:    Additional Comments:  [name] [name]) [name])    [phone]      [name], RN 06/27/2016, 10:07 AM

## 2016-06-27 NOTE — Progress Notes (Signed)
Spoke with OR and urethral catheter will be inserted by  them once she is transferred for surgery

## 2016-06-27 NOTE — Telephone Encounter (Signed)
Ms. [name] was seen yesterday by orthopedics and was found to have an acute left hip fracture. She will be undergoing surgery today. I will discontinue vancomycin and recommend continuing antibiotic treatment for possible persistent MRSA left prosthetic knee infection with doxycycline 100 mg by mouth twice daily and rifampin 300 mg by mouth twice daily. I recommend at least 6 months total therapy through 11/11/2016. I have spoken with her son, [name], today and given those recommendations. She will probably be going to a skilled nursing facility after discharge. I would recommend removal of her PICC upon discharge. I asked [name] to call and arrange follow-up with me in 6-8 weeks.

## 2016-06-27 NOTE — Discharge Instructions (Signed)
° ° °  1. Change dressings as needed °2. May shower but keep incisions covered and dry °3. Take lovenox to prevent blood clots °4. Take stool softeners as needed °5. Take pain meds as needed ° °

## 2016-06-27 NOTE — Transfer of Care (Signed)
Immediate Anesthesia Transfer of Care Note  Patient: [name]  Procedure(s) Performed: Procedure(s): LEFT TOTAL HIP ARTHROPLASTY ANTERIOR APPROACH (Left)  Patient Location: PACU  Anesthesia Type:Spinal  Level of Consciousness: awake  Airway & Oxygen Therapy: Patient Spontanous Breathing and Patient connected to nasal cannula oxygen  Post-op Assessment: Report given to RN and Post -op Vital signs reviewed and stable  Post vital signs: Reviewed and stable  Last Vitals:  Vitals:   06/27/16 0522 06/27/16 1523  BP: (!) 114/48 136/61  Pulse: 99 (!) 115  Resp: 17 18  Temp: 36.7 C     Last Pain:  Vitals:   06/27/16 1019  TempSrc:   PainSc: 3       Patients Stated Pain Goal: 3 (94/44/61 9012)  Complications: No apparent anesthesia complications

## 2016-06-27 NOTE — Progress Notes (Addendum)
[facility] PROGRESS NOTE  [name] MBT:[id number] DOB: 07-31-1940 DOA: 06/26/2016  PCP: Lamar Blinks, MD  Brief History/Interval Summary: 75 year old Caucasian female with a past medical history of CAD S/P 3 stents, HTN, HLD, and carotid artery disease.  She has a complicated orthopedic history that began in March with total knee replacement on the left.  This procedure was complicated by MRSA infection.  She has required multiple surgeries since then, including multiple I and D's, a lateral gastrocnemius muscle flap repair, and a skin graft.  She has been on multiple rounds of antibiotics.  Most recently, she has been on IV vancomycin for the past six weeks (followed by Dr. Megan Salon).  She has been in and out of SNFs this years but had actually improved to the point where she was back in her own home(a grandson lives with her and she has had home health support) and ambulating with a walker, until she fell on on 11/24.  She presented to the ED.  She complained of knee pain, but xrays of her left knee did not show acute fracture or dislocation.  She was discharged in stable condition with plans to follow up in ortho clinic. In the ortho clinic, she complained of progressive left hip pain and ambulatory dysfunction (she reports that she has essentially been wheelchair bound since her fall).  She was referred for imaging of her left hip, which shows a displaced fracture of the left femoral neck.  Hospitalist asked to facilitate direct admission.  Reason for Visit: Left hip fracture  Consultants: Orthopedics  Procedures: None yet  Antibiotics: Completed course of intravenous vancomycin. Currently on doxycycline and rifampin.  Subjective/Interval History: Patient complains of severe pain in her left hip area. Denies any chest pain, shortness of breath. Denies any nausea or vomiting.  ROS: Denies any headaches.  Objective:  Vital Signs  Vitals:   06/26/16 2335 06/27/16  0048 06/27/16 0314 06/27/16 0522  BP: 127/66 (!) 137/58 (!) 143/66 (!) 114/48  Pulse: 99 89 97 99  Resp: '16 16 18 17  '$ Temp: 98.2 F (36.8 C) 98.3 F (36.8 C) 98.3 F (36.8 C) 98.1 F (36.7 C)  TempSrc: Oral Oral Oral Oral  SpO2: 96% 100% 99% 95%  Weight:      Height:        Intake/Output Summary (Last 24 hours) at 06/27/16 1303 Last data filed at 06/27/16 0900  Gross per 24 hour  Intake              140 ml  Output              300 ml  Net             -160 ml   Filed Weights   06/26/16 2040  Weight: 52 kg (114 lb 9.6 oz)    General appearance: alert, cooperative, appears stated age and no distress Resp: clear to auscultation bilaterally Cardio: regular rate and rhythm, S1, S2 normal, no murmur, click, rub or gallop GI: soft, non-tender; bowel sounds normal; no masses,  no organomegaly Extremities: Left lower extremity was not mobilized. No obvious pedal edema. Neurologic: Awake and alert. Oriented 3. No focal neurological deficits  Lab Results:  Data Reviewed: I have personally reviewed following labs and imaging studies  CBC:  Recent Labs Lab 06/22/16 2225 06/26/16 2002  WBC 13.4* 7.5  NEUTROABS 10.8* 4.9  HGB 11.6* 9.4*  HCT 35.7* 30.1*  MCV 90.6 91.8  PLT 346 310    Basic  Metabolic Panel:  Recent Labs Lab 06/22/16 2225 06/26/16 2002  NA 135 137  K 4.2 3.9  CL 103 104  CO2 25 26  GLUCOSE 133* 204*  BUN 17 21*  CREATININE 0.67 0.68  CALCIUM 9.1 8.6*    GFR: Estimated Creatinine Clearance: 48.1 mL/min (by C-G formula based on SCr of 0.68 mg/dL).  Liver Function Tests:  Recent Labs Lab 06/26/16 2002  AST 47*  ALT 47  ALKPHOS 129*  BILITOT 0.6  PROT 5.9*  ALBUMIN 2.2*    Coagulation Profile:  Recent Labs Lab 06/26/16 2002  INR 1.20     Recent Results (from the past 240 hour(s))  MRSA PCR Screening     Status: None   Collection Time: 06/26/16  8:10 PM  Result Value Ref Range Status   MRSA by PCR NEGATIVE NEGATIVE Final     Comment:        The GeneXpert MRSA Assay (FDA approved for NASAL specimens only), is one component of a comprehensive MRSA colonization surveillance program. It is not intended to diagnose MRSA infection nor to guide or monitor treatment for MRSA infections.   Surgical pcr screen     Status: None   Collection Time: 06/26/16  8:10 PM  Result Value Ref Range Status   MRSA, PCR NEGATIVE NEGATIVE Final   Staphylococcus aureus NEGATIVE NEGATIVE Final    Comment:        The Xpert SA Assay (FDA approved for NASAL specimens in patients over 21 years of age), is one component of a comprehensive surveillance program.  Test performance has been validated by [facility] for patients greater than or equal to 29 year old. It is not intended to diagnose infection nor to guide or monitor treatment.       Radiology Studies: Dg Chest Port 1 View  Result Date: 06/27/2016 CLINICAL DATA:  PICC line placement EXAM: PORTABLE CHEST 1 VIEW COMPARISON:  None. FINDINGS: Right-sided PICC line tip overlies the lower SVC. There is aortic atherosclerosis. No focal airspace consolidation or pulmonary edema. No pneumothorax or sizable pleural effusion. IMPRESSION: PICC line tip overlying the lower SVC.  Aortic atherosclerosis. Electronically Signed   By: Ulyses Jarred M.D.   On: 06/27/2016 00:31   Xr Hip Unilat W Or W/o Pelvis 2-3 Views Left  Result Date: 06/26/2016 Displaced left femoral neck fracture.    Medications:  Scheduled: . acidophilus  1 capsule Oral Daily  . aspirin EC  81 mg Oral Daily  . atorvastatin  40 mg Oral Daily  . carvedilol  3.125 mg Oral BID WC  .  ceFAZolin (ANCEF) IV  2 g Intravenous To SS-Surg  . doxycycline  100 mg Oral BID WC  . feeding supplement (ENSURE ENLIVE)  237 mL Oral TID BM  . furosemide  20 mg Oral Daily  . lisinopril  5 mg Oral Daily  . mesalamine  2.4 g Oral Q breakfast  . mometasone-formoterol  2 puff Inhalation BID  . multivitamin with minerals  1  tablet Oral Daily  . pantoprazole  40 mg Oral Daily  . potassium chloride SA  20 mEq Oral Daily  . rifampin  600 mg Oral Daily  . sodium chloride flush  10-40 mL Intracatheter Q12H   Continuous:  QQP:[id number], ondansetron (ZOFRAN) IV, sodium chloride flush, traMADol  Assessment/Plan:  Principal Problem:   Left displaced femoral neck fracture (HCC) Active Problems:   Essential hypertension   Coronary atherosclerosis   MRSA (methicillin resistant Staphylococcus aureus) infection  Hip fracture (HCC)   Pressure injury of skin    Displaced left femoral neck fracture. Orthopedics is following. Plan is for surgical intervention today. Patient has had multiple surgeries in the last few months. She has tolerated all of them well. Does not appear that she has had any change in her risk profile. Does not need any cardiac testing prior to this surgery. Pain control.  History of MRSA infection of the left knee prosthesis. Recently completed a course of intravenous vancomycin. She has a PICC line. Plan was to initiate oral rifampin and doxycycline, which has been done by Dr. Megan Salon with infectious disease. She will need to continue to take this for at least 6 months according to his note. So the end date will be 11/11/2016. PICC line will need to be pulled out prior to discharge.  Essential hypertension Continue with home medications. Monitor blood pressures closely.  Hyperglycemia Elevated blood glucose level noted on a non-fasting blood sample last night. Repeat tomorrow morning. No known history of diabetes.  Coronary artery disease Status post stents in the past. Appears to be stable.  Normocytic anemia Monitor hemoglobin closely.   DVT Prophylaxis: Definitive prophylaxis postoperatively    Code Status: DO NOT RESUSCITATE  Family Communication: Discussed with the patient. No family at bedside  Disposition Plan: Await surgery    LOS: 1 day    [facility] Pager [phone] 06/27/2016, 1:03 PM  If 7PM-7AM, please contact night-coverage at [email or web address], password [facility]

## 2016-06-27 NOTE — Progress Notes (Signed)
Initial Nutrition Assessment  DOCUMENTATION CODES:   Not applicable  INTERVENTION:   Once diet is resumed, continue Ensure Enlive po TID, each supplement provides 350 kcal and 20 grams of protein  NUTRITION DIAGNOSIS:   Increased nutrient needs related to wound healing as evidenced by estimated needs.  GOAL:   Patient will meet greater than or equal to 90% of their needs  MONITOR:   Diet advancement, Labs, Weight trends, Skin, I & O's, Supplement acceptance, PO intake  REASON FOR ASSESSMENT:   Malnutrition Screening Tool, Consult Assessment of nutrition requirement/status  ASSESSMENT:   [name] is a 75 y.o. woman with a history of CAD S/P 3 stents, HTN, HLD, and carotid artery disease.  She has a complicated orthopedic history that began in March with total knee replacement on the left.  This procedure was complicated by MRSA infection.  She has required multiple surgeries since then, including multiple I and D's, a lateral gastrocnemius muscle flap repair, and a skin graft.  She has been on multiple rounds of antibiotics.  Most recently, she has been on IV vancomycin for the past six weeks (followed by Dr. Megan Salon).  She has been in and out of SNFs this years but had actually improved to the point where she was back in her own home(a grandson lives with her and she has had home health support) and ambulating with a walker, until she fell on on 11/24.   Pt admitted with lt displaced femoral hip fx. Pt is NPO for surgery today.   Attempted to examine pt x 2, however, pt either receiving nursing care or down in OR at time of visit.   Pt familiar to this RD due to previous hospitalization. Pt has hx of poor oral intake and weight loss. Wt has stabilized over the past 4 months. Per [facility] note, pt with unstageable lt heel pressure injury with eschar. Pt previously took Ensure supplements at prior admission and are currently ordered. Will resume supplements once diet is  advanced.   Unable to complete Nutrition-Focused physical exam at this time. However, suspect likelihood of malnutrition given hx of weight loss, poor oral intake, and pressure injuries.   Labs reviewed.   Diet Order:  Diet NPO time specified Diet NPO time specified Except for: Sips with Meds  Skin:  Wound (see comment) (lt heel UN pressure injury w eschar)  Last BM:  06/26/16  Height:   Ht Readings from Last 1 Encounters:  06/26/16 '5\' 2"'$  (1.575 m)    Weight:   Wt Readings from Last 1 Encounters:  06/26/16 114 lb 9.6 oz (52 kg)    Ideal Body Weight:  50 kg  BMI:  Body mass index is 20.96 kg/m.  Estimated Nutritional Needs:   Kcal:  1550-1750  Protein:  75-90 grams  Fluid:  >1.5 L  EDUCATION NEEDS:   Education needs no appropriate at this time  Elvena Oyer A. Jimmye Norman, RD, LDN, CDE Pager: [phone] After hours Pager: [phone]

## 2016-06-27 NOTE — Consult Note (Signed)
[place] Nurse wound consult note Reason for Consult: left heel wound Wound type: Unstageable Pressure Injury Patient reports that after knee replacement she was in an immobilizer for a long time and the heel injury occurred at that time.  Pressure Ulcer POA: Yes Measurement: 2.0cm x 1.0cm x 0 Wound bed:100% eschar Drainage (amount, consistency, odor) none Periwound: intact  Dressing procedure/placement/frequency: Paint stable eschar on the left heel with betadine daily. Will promote dryness and serve as antibacterial until eschar falls off.  No pressure on the heel as much as possible.  Discussed POC with patient and bedside nurse.  Re consult if needed, will not follow at this time. Thanks  Malavika Lira [employer], RN,CWOCN, CNS [phone])

## 2016-06-27 NOTE — Anesthesia Postprocedure Evaluation (Signed)
Anesthesia Post Note  Patient: [name]  Procedure(s) Performed: Procedure(s) (LRB): LEFT TOTAL HIP ARTHROPLASTY ANTERIOR APPROACH (Left)  Patient location during evaluation: PACU Anesthesia Type: Spinal Level of consciousness: awake and alert and oriented Pain management: pain level controlled Vital Signs Assessment: post-procedure vital signs reviewed and stable Respiratory status: spontaneous breathing, nonlabored ventilation, respiratory function stable and patient connected to nasal cannula oxygen Cardiovascular status: blood pressure returned to baseline and stable Postop Assessment: no headache, no backache, spinal receding, patient able to bend at knees and no signs of nausea or vomiting Anesthetic complications: no    Last Vitals:  Vitals:   06/27/16 1856 06/27/16 1900  BP: 120/64   Pulse: 80 82  Resp: 17 13  Temp:      Last Pain:  Vitals:   06/27/16 1842  TempSrc:   PainSc: Asleep                 Keoshia Steinmetz A.

## 2016-06-28 ENCOUNTER — Encounter (HOSPITAL_COMMUNITY): Payer: Self-pay

## 2016-06-28 DIAGNOSIS — I2510 Atherosclerotic heart disease of native coronary artery without angina pectoris: Secondary | ICD-10-CM

## 2016-06-28 LAB — BASIC METABOLIC PANEL
ANION GAP: 5 (ref 5–15)
BUN: 12 mg/dL (ref 6–20)
CHLORIDE: 104 mmol/L (ref 101–111)
CO2: 25 mmol/L (ref 22–32)
CREATININE: 0.61 mg/dL (ref 0.44–1.00)
Calcium: 8.4 mg/dL — ABNORMAL LOW (ref 8.9–10.3)
GFR calc non Af Amer: >60 mL/min (ref >60)
Glucose, Bld: 120 mg/dL — ABNORMAL HIGH (ref 65–99)
POTASSIUM: 3.9 mmol/L (ref 3.5–5.1)
SODIUM: 134 mmol/L — AB (ref 135–145)

## 2016-06-28 LAB — CBC
HCT: 26.3 % — ABNORMAL LOW (ref 36.0–46.0)
HEMOGLOBIN: 8.3 g/dL — AB (ref 12.0–15.0)
MCH: 29 pg (ref 26.0–34.0)
MCHC: 31.6 g/dL (ref 30.0–36.0)
MCV: 92 fL (ref 78.0–100.0)
Platelets: 307 10*3/uL (ref 150–400)
RBC: 2.86 MIL/uL — AB (ref 3.87–5.11)
RDW: 16.4 % — ABNORMAL HIGH (ref 11.5–15.5)
WBC: 7 10*3/uL (ref 4.0–10.5)

## 2016-06-28 MED ORDER — WHITE PETROLATUM GEL
Status: AC
  Filled 2016-06-28: qty 1

## 2016-06-28 NOTE — Progress Notes (Addendum)
[facility] PROGRESS NOTE  [name] YTK:[id number] DOB: 20-Jun-1941 DOA: 06/26/2016  PCP: Lamar Blinks, MD  Brief History/Interval Summary: 75 year old Caucasian female with a past medical history of CAD S/P 3 stents, HTN, HLD, and carotid artery disease.  She has a complicated orthopedic history that began in March with total knee replacement on the left.  This procedure was complicated by MRSA infection.  She has required multiple surgeries since then, including multiple I and D's, a lateral gastrocnemius muscle flap repair, and a skin graft.  She has been on multiple rounds of antibiotics.  Most recently, she has been on IV vancomycin for the past six weeks (followed by Dr. Megan Salon).  She has been in and out of SNFs this years but had actually improved to the point where she was back in her own home(a grandson lives with her and she has had home health support) and ambulating with a walker, until she fell on on 11/24.  She presented to the ED.  She complained of knee pain, but xrays of her left knee did not show acute fracture or dislocation.  She was discharged in stable condition with plans to follow up in ortho clinic. In the ortho clinic, she complained of progressive left hip pain and ambulatory dysfunction (she reports that she has essentially been wheelchair bound since her fall).  She was referred for imaging of her left hip, which shows a displaced fracture of the left femoral neck.  Hospitalist asked to facilitate direct admission.  Reason for Visit: Left hip fracture  Consultants: Orthopedics  Procedures: Total hip replacement and left hip. 11/29  Antibiotics: Completed course of intravenous vancomycin. Currently on doxycycline and rifampin.  Subjective/Interval History: Patient feels comfortable this morning. Pain is not as severe as yesterday. Although she did experience an episode of vomiting this morning. Denies any abdominal pain.   ROS: Denies any  headaches.  Objective:  Vital Signs  Vitals:   06/27/16 1945 06/27/16 2035 06/28/16 0028 06/28/16 0538  BP: (!) 153/66 (!) 162/53 (!) 152/53 (!) 180/83  Pulse: 76 77 84 73  Resp: '19 15 18 18  '$ Temp:  97.9 F (36.6 C) 98.1 F (36.7 C) 98.2 F (36.8 C)  TempSrc:  Oral Oral Oral  SpO2: 99% 99% 99% 93%  Weight:      Height:        Intake/Output Summary (Last 24 hours) at 06/28/16 1242 Last data filed at 06/28/16 1115  Gross per 24 hour  Intake          2918.33 ml  Output              300 ml  Net          2618.33 ml   Filed Weights   06/26/16 2040  Weight: 52 kg (114 lb 9.6 oz)    General appearance: alert, cooperative, appears stated age and no distress Resp: clear to auscultation bilaterally Cardio: regular rate and rhythm, S1, S2 normal, no murmur, click, rub or gallop GI: soft, non-tender; bowel sounds normal; no masses,  no organomegaly Extremities: No bruising noted over the left thigh area. Dressing is present over her incision site. Neurologic: Awake and alert. Oriented 3. No focal neurological deficits  Lab Results:  Data Reviewed: I have personally reviewed following labs and imaging studies  CBC:  Recent Labs Lab 06/22/16 2225 06/26/16 2002 06/28/16 0430  WBC 13.4* 7.5 7.0  NEUTROABS 10.8* 4.9  --   HGB 11.6* 9.4* 8.3*  HCT 35.7*  30.1* 26.3*  MCV 90.6 91.8 92.0  PLT 346 310 270    Basic Metabolic Panel:  Recent Labs Lab 06/22/16 2225 06/26/16 2002 06/28/16 0430  NA 135 137 134*  K 4.2 3.9 3.9  CL 103 104 104  CO2 '25 26 25  '$ GLUCOSE 133* 204* 120*  BUN 17 21* 12  CREATININE 0.67 0.68 0.61  CALCIUM 9.1 8.6* 8.4*    GFR: Estimated Creatinine Clearance: 48.1 mL/min (by C-G formula based on SCr of 0.61 mg/dL).  Liver Function Tests:  Recent Labs Lab 06/26/16 2002  AST 47*  ALT 47  ALKPHOS 129*  BILITOT 0.6  PROT 5.9*  ALBUMIN 2.2*    Coagulation Profile:  Recent Labs Lab 06/26/16 2002  INR 1.20     Recent Results  (from the past 240 hour(s))  MRSA PCR Screening     Status: None   Collection Time: 06/26/16  8:10 PM  Result Value Ref Range Status   MRSA by PCR NEGATIVE NEGATIVE Final    Comment:        The GeneXpert MRSA Assay (FDA approved for NASAL specimens only), is one component of a comprehensive MRSA colonization surveillance program. It is not intended to diagnose MRSA infection nor to guide or monitor treatment for MRSA infections.   Surgical pcr screen     Status: None   Collection Time: 06/26/16  8:10 PM  Result Value Ref Range Status   MRSA, PCR NEGATIVE NEGATIVE Final   Staphylococcus aureus NEGATIVE NEGATIVE Final    Comment:        The Xpert SA Assay (FDA approved for NASAL specimens in patients over 45 years of age), is one component of a comprehensive surveillance program.  Test performance has been validated by [facility] for patients greater than or equal to 13 year old. It is not intended to diagnose infection nor to guide or monitor treatment.       Radiology Studies: Pelvis Portable  Result Date: 06/27/2016 CLINICAL DATA:  Status post left hip replacement EXAM: PORTABLE PELVIS 1-2 VIEWS COMPARISON:  None. FINDINGS: Bilateral hip replacements are seen. No acute bony or soft tissue abnormality is noted. IMPRESSION: Status post left hip replacement Electronically Signed   By: Inez Catalina M.D.   On: 06/27/2016 19:44   Dg Chest Port 1 View  Result Date: 06/27/2016 CLINICAL DATA:  PICC line placement EXAM: PORTABLE CHEST 1 VIEW COMPARISON:  None. FINDINGS: Right-sided PICC line tip overlies the lower SVC. There is aortic atherosclerosis. No focal airspace consolidation or pulmonary edema. No pneumothorax or sizable pleural effusion. IMPRESSION: PICC line tip overlying the lower SVC.  Aortic atherosclerosis. Electronically Signed   By: Ulyses Jarred M.D.   On: 06/27/2016 00:31   Dg C-arm 1-60 Min  Result Date: 06/27/2016 CLINICAL DATA:  Left femoral fracture  EXAM: OPERATIVE LEFT HIP WITH PELVIS; DG C-ARM 61-120 MIN COMPARISON:  None. FLUOROSCOPY TIME:  Radiation Exposure Index (as provided by the fluoroscopic device): Not available If the device does not provide the exposure index: Fluoroscopy Time:  34 seconds Number of Acquired Images:  3 FINDINGS: The left femoral neck fracture is again identified. Subsequent hip replacement is noted in satisfactory position. No acute bony or soft tissue abnormality is noted. IMPRESSION: Status post left hip replacement Electronically Signed   By: Inez Catalina M.D.   On: 06/27/2016 19:43   Dg Hip Operative Unilat W Or W/o Pelvis Left  Result Date: 06/27/2016 CLINICAL DATA:  Left femoral fracture EXAM: OPERATIVE  LEFT HIP WITH PELVIS; DG C-ARM 61-120 MIN COMPARISON:  None. FLUOROSCOPY TIME:  Radiation Exposure Index (as provided by the fluoroscopic device): Not available If the device does not provide the exposure index: Fluoroscopy Time:  34 seconds Number of Acquired Images:  3 FINDINGS: The left femoral neck fracture is again identified. Subsequent hip replacement is noted in satisfactory position. No acute bony or soft tissue abnormality is noted. IMPRESSION: Status post left hip replacement Electronically Signed   By: Inez Catalina M.D.   On: 06/27/2016 19:43   Xr Hip Unilat W Or W/o Pelvis 2-3 Views Left  Result Date: 06/26/2016 Displaced left femoral neck fracture.    Medications:  Scheduled: . acidophilus  1 capsule Oral Daily  . aspirin EC  81 mg Oral Daily  . atorvastatin  40 mg Oral Daily  . carvedilol  3.125 mg Oral BID WC  . doxycycline  100 mg Oral BID WC  . enoxaparin (LOVENOX) injection  40 mg Subcutaneous Q24H  . feeding supplement (ENSURE ENLIVE)  237 mL Oral TID BM  . furosemide  20 mg Oral Daily  . lisinopril  5 mg Oral Daily  . mesalamine  2.4 g Oral Q breakfast  . mometasone-formoterol  2 puff Inhalation BID  . multivitamin with minerals  1 tablet Oral Daily  . pantoprazole  40 mg Oral  Daily  . potassium chloride SA  20 mEq Oral Daily  . rifampin  600 mg Oral Daily  . sodium chloride flush  10-40 mL Intracatheter Q12H  . tranexamic acid (CYKLOKAPRON) topical -INTRAOP  2,000 mg Topical To OR   Continuous: . sodium chloride 10 mL/hr at 06/28/16 0551   GXQ:JJHERDEYCXKGY **OR** acetaminophen, alum & mag hydroxide-simeth, HYDROcodone-acetaminophen, menthol-cetylpyridinium **OR** phenol, methocarbamol **OR** methocarbamol (ROBAXIN)  IV, metoCLOPramide **OR** metoCLOPramide (REGLAN) injection, morphine injection, ondansetron **OR** ondansetron (ZOFRAN) IV, oxyCODONE, sodium chloride flush, traMADol  Assessment/Plan:  Principal Problem:   Left displaced femoral neck fracture (HCC) Active Problems:   Essential hypertension   Coronary atherosclerosis   MRSA (methicillin resistant Staphylococcus aureus) infection   Hip fracture (HCC)   Pressure injury of skin    Displaced left femoral neck fracture. Patient underwent total hip replacement yesterday. Seems to be stable postoperatively. Pain control.   History of MRSA infection of the left knee prosthesis. Recently completed a course of intravenous vancomycin. She has a PICC line. Plan was to initiate oral rifampin and doxycycline, which has been done by Dr. Megan Salon with infectious disease. She will need to continue to take this for at least 6 months according to his note. So the end date will be 11/11/2016. PICC line will need to be pulled out prior to discharge.  Vomiting Patient with one episode of emesis this morning. Abdomen is completely benign. Could be related to medications. Continue to monitor for now.  Essential hypertension Continue with home medications. Monitor blood pressures closely.  Hyperglycemia Elevated blood glucose level noted on a non-fasting blood sample. It was 204. 120 this morning. No known history of diabetes. Outpatient monitoring.  Coronary artery disease Status post stents in the past.  Appears to be stable.  Normocytic anemia Monitor hemoglobin closely. Some drop in hemoglobin is likely due to operative loss. Monitor for now.   DVT Prophylaxis: Now on Lovenox  Code Status: DO NOT RESUSCITATE  Family Communication: Discussed with the patient. Discussed with her son who is at the bedside Disposition Plan: Await PT and OT evaluation. Will likely need to go to SNF when ready  for discharge.    LOS: 2 days   [place] [facility] Pager [phone] 06/28/2016, 12:42 PM  If 7PM-7AM, please contact night-coverage at [email or web address], password [facility]

## 2016-06-28 NOTE — Progress Notes (Signed)
PT Cancellation Note  Patient Details Name: [name] MRN: [id number] DOB: 1941/06/23   Cancelled Treatment:    Reason Eval/Treat Not Completed: Medical issues which prohibited therapy;Patient declined, no reason specified. Pt currently with reports of nausea, in flexed position in bed. Pt's nurse was notified of request for nausea medication. PT will continue to f/u with pt as appropriate.   [name] 06/28/2016, 2:14 PM Sherie Don, [place], DPT [phone]

## 2016-06-28 NOTE — Progress Notes (Signed)
OT Cancellation Note  Patient Details Name: [name] MRN: [id number] DOB: [birth year]   Cancelled Treatment:    Reason Eval/Treat Not Completed: Medical issues which prohibited therapy;Other (comment) (Pt very nauseous on arrival of therapy.) Nursing notified of request for nausea med. Will reattempt OT eval once nausea improves, likely tomorrow.   Hortencia Pilar 06/28/2016, 2:14 PM

## 2016-06-28 NOTE — Progress Notes (Signed)
   Subjective:  Patient reports pain as moderate.    Objective:   VITALS:   Vitals:   06/27/16 1945 06/27/16 2035 06/28/16 0028 06/28/16 0538  BP: (!) 153/66 (!) 162/53 (!) 152/53 (!) 180/83  Pulse: 76 77 84 73  Resp: '19 15 18 18  '$ Temp:  97.9 F (36.6 C) 98.1 F (36.7 C) 98.2 F (36.8 C)  TempSrc:  Oral Oral Oral  SpO2: 99% 99% 99% 93%  Weight:      Height:        Neurologically intact ABD soft Neurovascular intact Sensation intact distally Intact pulses distally Dorsiflexion/Plantar flexion intact Incision: dressing C/D/I and no drainage No cellulitis present Compartment soft   Lab Results  Component Value Date   WBC 7.0 06/28/2016   HGB 8.3 (L) 06/28/2016   HCT 26.3 (L) 06/28/2016   MCV 92.0 06/28/2016   PLT 307 06/28/2016     Assessment/Plan:  1 Day Post-Op   - Expected postop acute blood loss anemia - will monitor for symptoms - Up with PT/OT - DVT ppx - SCDs, ambulation, lovenox - WBAT operative extremity   Eduard Roux 06/28/2016, 8:20 AM [phone]

## 2016-06-29 DIAGNOSIS — D649 Anemia, unspecified: Secondary | ICD-10-CM

## 2016-06-29 DIAGNOSIS — R112 Nausea with vomiting, unspecified: Secondary | ICD-10-CM

## 2016-06-29 LAB — HEPATIC FUNCTION PANEL
ALBUMIN: 1.8 g/dL — AB (ref 3.5–5.0)
ALT: 52 U/L (ref 14–54)
AST: 67 U/L — AB (ref 15–41)
Alkaline Phosphatase: 152 U/L — ABNORMAL HIGH (ref 38–126)
BILIRUBIN DIRECT: 0.2 mg/dL (ref 0.1–0.5)
BILIRUBIN TOTAL: 0.5 mg/dL (ref 0.3–1.2)
Indirect Bilirubin: 0.3 mg/dL (ref 0.3–0.9)
Total Protein: 5.8 g/dL — ABNORMAL LOW (ref 6.5–8.1)

## 2016-06-29 LAB — CBC
HCT: 23.7 % — ABNORMAL LOW (ref 36.0–46.0)
HEMOGLOBIN: 7.7 g/dL — AB (ref 12.0–15.0)
MCH: 29.6 pg (ref 26.0–34.0)
MCHC: 32.5 g/dL (ref 30.0–36.0)
MCV: 91.2 fL (ref 78.0–100.0)
Platelets: 263 10*3/uL (ref 150–400)
RBC: 2.6 MIL/uL — ABNORMAL LOW (ref 3.87–5.11)
RDW: 16.8 % — ABNORMAL HIGH (ref 11.5–15.5)
WBC: 8.4 10*3/uL (ref 4.0–10.5)

## 2016-06-29 LAB — URINALYSIS, ROUTINE W REFLEX MICROSCOPIC
BILIRUBIN URINE: NEGATIVE
GLUCOSE, UA: NEGATIVE mg/dL
Hgb urine dipstick: NEGATIVE
KETONES UR: NEGATIVE mg/dL
NITRITE: NEGATIVE
PH: 5 (ref 5.0–8.0)
Protein, ur: NEGATIVE mg/dL
SPECIFIC GRAVITY, URINE: 1.012 (ref 1.005–1.030)

## 2016-06-29 LAB — BASIC METABOLIC PANEL
Anion gap: 8 (ref 5–15)
BUN: 15 mg/dL (ref 6–20)
CHLORIDE: 103 mmol/L (ref 101–111)
CO2: 25 mmol/L (ref 22–32)
CREATININE: 0.68 mg/dL (ref 0.44–1.00)
Calcium: 8.3 mg/dL — ABNORMAL LOW (ref 8.9–10.3)
GFR calc Af Amer: >60 mL/min (ref >60)
GFR calc non Af Amer: >60 mL/min (ref >60)
GLUCOSE: 110 mg/dL — AB (ref 65–99)
Potassium: 3.7 mmol/L (ref 3.5–5.1)
SODIUM: 136 mmol/L (ref 135–145)

## 2016-06-29 LAB — URINE MICROSCOPIC-ADD ON

## 2016-06-29 LAB — PREPARE RBC (CROSSMATCH)

## 2016-06-29 MED ORDER — SODIUM CHLORIDE 0.9 % IV SOLN
Freq: Once | INTRAVENOUS | Status: AC
  Administered 2016-06-29: 10:00:00 via INTRAVENOUS

## 2016-06-29 NOTE — Progress Notes (Signed)
   Subjective:  Patient reports pain as moderate.    Objective:   VITALS:   Vitals:   06/28/16 0829 06/28/16 1735 06/28/16 2147 06/29/16 0500  BP: 139/76 (!) 124/41 (!) 128/39 (!) 138/58  Pulse: 77 (!) 115 79 88  Resp:  '18 20 18  '$ Temp:  100.3 F (37.9 C) 97.9 F (36.6 C) 98 F (36.7 C)  TempSrc:   Oral Oral  SpO2:  96% 99% 97%  Weight:      Height:        Neurologically intact ABD soft Neurovascular intact Sensation intact distally Intact pulses distally Dorsiflexion/Plantar flexion intact Incision: dressing C/D/I and no drainage No cellulitis present Compartment soft   Lab Results  Component Value Date   WBC 8.4 06/29/2016   HGB 7.7 (L) 06/29/2016   HCT 23.7 (L) 06/29/2016   MCV 91.2 06/29/2016   PLT 263 06/29/2016     Assessment/Plan:  2 Days Post-Op   - Expected postop acute blood loss anemia - will monitor for symptoms, consider transfusion - Up with PT/OT - DVT ppx - SCDs, ambulation, lovenox - WBAT operative extremity   Eduard Roux 06/29/2016, 10:38 AM [phone]

## 2016-06-29 NOTE — Progress Notes (Signed)
Physical Therapy Evaluation Patient Details Name: [name] MRN: [id number] DOB: 1941-07-16 Today's Date: 06/29/2016   History of Present Illness  75 y.o.woman with a history of CAD S/P 3 stents, HTN, HLD, and carotid artery disease. Pt presenting s/p fall on 11/24. Initially she c/o L knee pain then progressive L hip pain and ambulatory dysfunction. Imaging of L hip shoed a displaced fx of the L femoral neck. Now s/p L THA direct antrior approach. Patient recieving blood but cleared by nursing to see for bed to chair transfer.   Clinical Impression  Patient tolerated treatment well today but her mobility is limited. She is hesitant to weight bear on the left leg. She was abelt ot transfer to a chair but required assistance. She would benefit from skilled therapy to return to PLOF.     Follow Up Recommendations SNF    Equipment Recommendations       Recommendations for Other Services       Precautions / Restrictions Precautions Precautions: Fall Precaution Comments: direct anterior THA Restrictions Weight Bearing Restrictions: Yes LLE Weight Bearing: Weight bearing as tolerated      Mobility  Bed Mobility Overal bed mobility: Needs Assistance Bed Mobility: Supine to Sit     Supine to sit: Mod assist;+2 for physical assistance     General bed mobility comments: Assist of left lower extrmeity out of the bed. Assist of trunk to sitting  Transfers Overall transfer level: Needs assistance Equipment used: Rolling walker (2 wheeled) Transfers: Sit to/from [facility] Sit to Stand: Min assist;+2 physical assistance;+2 safety/equipment Stand pivot transfers: Min assist;+2 physical assistance;+2 safety/equipment       General transfer comment: Min assist +2 to boost up from EOB and perform stand pivot to chair. Cues for hand placement and technique. Cues for sequencing of stand pivot. Cues to put left lower extremity on the ground. Minimal weight bearing  noted.   Ambulation/Gait Ambulation/Gait assistance: Min assist Ambulation Distance (Feet): 3 Feet Assistive device: Rolling walker (2 wheeled) Gait Pattern/deviations: Step-to pattern   Gait velocity interpretation: <1.8 ft/sec, indicative of risk for recurrent falls General Gait Details: Minimal weight bearing on the right lower extrmeity   Stairs            Wheelchair Mobility    Modified Rankin (Stroke Patients Only)       Balance Overall balance assessment: Needs assistance Sitting-balance support: Feet supported Sitting balance-Leahy Scale: Fair     Standing balance support: Bilateral upper extremity supported Standing balance-Leahy Scale: Poor Standing balance comment: RW required                              Pertinent Vitals/Pain Pain Assessment: 0-10 Pain Score: 7  Pain Location: L knee  Pain Descriptors / Indicators: Aching;Sore Pain Intervention(s): Monitored during session;Repositioned;Utilized relaxation techniques    Home Living Family/patient expects to be discharged to:: Skilled nursing facility                      Prior Function Level of Independence: Independent with assistive device(s)         Comments: Per pt report she has been in and out of SNFs over the past year. Most recently she was living at home with her grandson. Ambulating with RW and independent with ADL.     Hand Dominance   Dominant Hand: Right    Extremity/Trunk Assessment   Upper Extremity Assessment: Defer  to OT evaluation           Lower Extremity Assessment: Generalized weakness      Cervical / Trunk Assessment: Kyphotic  Communication   Communication: No difficulties  Cognition Arousal/Alertness: Awake/alert Behavior During Therapy: Flat affect Overall Cognitive Status: No family/caregiver present to determine baseline cognitive functioning                      General Comments      Exercises     Assessment/Plan     PT Assessment Patient needs continued PT services  PT Problem List Decreased strength;Decreased range of motion;Decreased activity tolerance;Decreased balance;Decreased mobility          PT Treatment Interventions DME instruction;Gait training;Stair training;Functional mobility training;Therapeutic activities;Therapeutic exercise;Balance training;Neuromuscular re-education;Patient/family education;Manual techniques    PT Goals (Current goals can be found in the Care Plan section)  Acute Rehab PT Goals Patient Stated Goal: rehab before home PT Goal Formulation: With patient Time For Goal Achievement: 07/13/16 Potential to Achieve Goals: Good    Frequency 7X/week   Barriers to discharge Inaccessible home environment      Co-evaluation PT/OT/SLP Co-Evaluation/Treatment: Yes Reason for Co-Treatment: For patient/therapist safety PT goals addressed during session: Mobility/safety with mobility;Balance;Proper use of DME OT goals addressed during session: ADL's and self-care       End of Session Equipment Utilized During Treatment: Gait belt Activity Tolerance: Patient limited by fatigue Patient left: in chair;with call bell/phone within reach;with chair alarm set Nurse Communication: Mobility status;Precautions;Weight bearing status         Time: 6568-1275 PT Time Calculation (min) (ACUTE ONLY): 26 min   Charges:   PT Evaluation $PT Eval Moderate Complexity: 1 Procedure     PT G Codes:        Carney Living PT DPT  06/29/2016, 3:58 PM

## 2016-06-29 NOTE — Evaluation (Signed)
Occupational Therapy Evaluation Patient Details Name: [name] MRN: [id number] DOB: 1941-02-04 Today's Date: 06/29/2016    History of Present Illness 75 y.o.woman with a history of CAD S/P 3 stents, HTN, HLD, and carotid artery disease. Pt presenting s/p fall on 11/24. Initially she c/o L knee pain then progressive L hip pain and ambulatory dysfunction. Imaging of L hip shoed a displaced fx of the L femoral neck. Now s/p L THA direct antrior approach.   Clinical Impression   Pt reports that she as been managing ADL independently PTA. Currently pt requires min assist +2 for basic transfers, min guard for seated ADL, and max assist for LB ADL. Recommending SNF for follow up to maximize independence and safety with ADL and functional mobility prior to return home. All further OT needs can be met at the next venue of care; will defer OT to SNF. Please re-consult if needs change. Thank you for this referral.    Follow Up Recommendations  SNF;Supervision/Assistance - 24 hour    Equipment Recommendations  Other (comment) (TBD at next venue)    Recommendations for Other Services       Precautions / Restrictions Precautions Precautions: Fall Precaution Comments: direct anterior THA Restrictions Weight Bearing Restrictions: Yes LLE Weight Bearing: Weight bearing as tolerated      Mobility Bed Mobility Overal bed mobility: Needs Assistance Bed Mobility: Supine to Sit     Supine to sit: Mod assist;+2 for physical assistance     General bed mobility comments: Assist for LEs to EOB and for trunk elevation from supine to sit. Pt able to scoot hips out of EOB with light min assist and use of bed pad.  Transfers Overall transfer level: Needs assistance Equipment used: Rolling walker (2 wheeled) Transfers: Sit to/from [facility] Sit to Stand: Min assist;+2 physical assistance;+2 safety/equipment Stand pivot transfers: Min assist;+2 physical assistance;+2  safety/equipment       General transfer comment: Min assist +2 to boost up from EOB and perform stand pivot to chair. Cues for hand placement and technique. Cues for sequencing of stand pivot.    Balance Overall balance assessment: Needs assistance Sitting-balance support: Feet supported;Bilateral upper extremity supported Sitting balance-Leahy Scale: Fair     Standing balance support: Bilateral upper extremity supported Standing balance-Leahy Scale: Poor Standing balance comment: RW for support                            ADL Overall ADL's : Needs assistance/impaired Eating/Feeding: Set up;Sitting   Grooming: Min guard;Sitting   Upper Body Bathing: Min guard;Sitting   Lower Body Bathing: Maximal assistance;Sit to/from stand   Upper Body Dressing : Min guard;Sitting   Lower Body Dressing: Maximal assistance;Sit to/from stand Lower Body Dressing Details (indicate cue type and reason): to adjust socks sitting EOB Toilet Transfer: Minimal assistance;+2 for physical assistance;+2 for safety/equipment;Stand-pivot;BSC;RW Toilet Transfer Details (indicate cue type and reason): Simulated by stand pivot from EOB to chair         Functional mobility during ADLs: Minimal assistance;+2 for physical assistance;+2 for safety/equipment;Rolling walker (for stand pivot only) General ADL Comments: Cues throughout for sequencing of stand pivot and bed mobility.     Vision Vision Assessment?: No apparent visual deficits   Perception     Praxis      Pertinent Vitals/Pain Pain Assessment: 0-10 Pain Score: 7  Pain Location: L knee Pain Descriptors / Indicators: Aching;Sore Pain Intervention(s): Monitored during session;Premedicated before  session;Repositioned     Hand Dominance Right   Extremity/Trunk Assessment Upper Extremity Assessment Upper Extremity Assessment: Generalized weakness   Lower Extremity Assessment Lower Extremity Assessment: Defer to PT evaluation    Cervical / Trunk Assessment Cervical / Trunk Assessment: Kyphotic   Communication Communication Communication: No difficulties   Cognition Arousal/Alertness: Awake/alert Behavior During Therapy: Flat affect Overall Cognitive Status: No family/caregiver present to determine baseline cognitive functioning                     General Comments       Exercises       Shoulder Instructions      Home Living Family/patient expects to be discharged to:: Skilled nursing facility                                        Prior Functioning/Environment Level of Independence: Independent with assistive device(s)        Comments: Per pt report she has been in and out of SNFs over the past year. Most recently she was living at home with her grandson. Ambulating with RW and independent with ADL.        OT Problem List:     OT Treatment/Interventions:      OT Goals(Current goals can be found in the care plan section) Acute Rehab OT Goals Patient Stated Goal: rehab before home OT Goal Formulation: With patient  OT Frequency:     Barriers to D/C:            Co-evaluation PT/OT/SLP Co-Evaluation/Treatment: Yes Reason for Co-Treatment: For patient/therapist safety   OT goals addressed during session: ADL's and self-care      End of Session Equipment Utilized During Treatment: Gait belt;Rolling walker Nurse Communication: Mobility status  Activity Tolerance: Patient tolerated treatment well Patient left: in chair;with call bell/phone within reach;with chair alarm set   Time: 2158-7276 OT Time Calculation (min): 26 min Charges:  OT General Charges $OT Visit: 1 Procedure OT Evaluation $OT Eval Moderate Complexity: 1 Procedure G-Codes:      Binnie Kand M.S., OTR/L Pager: [phone]  06/29/2016, 2:30 PM

## 2016-06-29 NOTE — Care Management Important Message (Signed)
Important Message  Patient Details  Name: [name] MRN: [id number] Date of Birth: November 08, 1940   Medicare Important Message Given:  Yes    Eboney Claybrook Abena 06/29/2016, 1:12 PM

## 2016-06-29 NOTE — Progress Notes (Signed)
[facility] PROGRESS NOTE  [name] VOZ:[id number] DOB: 1940-09-22 DOA: 06/26/2016  PCP: Lamar Blinks, MD  Brief History/Interval Summary: 75 year old Caucasian female with a past medical history of CAD S/P 3 stents, HTN, HLD, and carotid artery disease.  She has a complicated orthopedic history that began in March with total knee replacement on the left.  This procedure was complicated by MRSA infection.  She has required multiple surgeries since then, including multiple I and D's, a lateral gastrocnemius muscle flap repair, and a skin graft.  She has been on multiple rounds of antibiotics.  Most recently, she has been on IV vancomycin for the past six weeks (followed by Dr. Megan Salon).  She has been in and out of SNFs this years but had actually improved to the point where she was back in her own home(a grandson lives with her and she has had home health support) and ambulating with a walker, until she fell on on 11/24.  She presented to the ED.  She complained of knee pain, but xrays of her left knee did not show acute fracture or dislocation.  She was discharged in stable condition with plans to follow up in ortho clinic. In the ortho clinic, she complained of progressive left hip pain and ambulatory dysfunction (she reports that she has essentially been wheelchair bound since her fall).  She was referred for imaging of her left hip, which shows a displaced fracture of the left femoral neck.  Hospitalist asked to facilitate direct admission.  Reason for Visit: Left hip fracture  Consultants: Orthopedics  Procedures: Total hip replacement and left hip. 11/29  Antibiotics: Completed course of intravenous vancomycin. Currently on doxycycline and rifampin.  Subjective/Interval History: Patient had a few episodes of vomiting yesterday. Seems to be distracted this morning. Her daughter-in-law is at the bedside. She did finish her clear liquid breakfast.   ROS: Denies any  headaches.  Objective:  Vital Signs  Vitals:   06/28/16 0829 06/28/16 1735 06/28/16 2147 06/29/16 0500  BP: 139/76 (!) 124/41 (!) 128/39 (!) 138/58  Pulse: 77 (!) 115 79 88  Resp:  '18 20 18  '$ Temp:  100.3 F (37.9 C) 97.9 F (36.6 C) 98 F (36.7 C)  TempSrc:   Oral Oral  SpO2:  96% 99% 97%  Weight:      Height:        Intake/Output Summary (Last 24 hours) at 06/29/16 1047 Last data filed at 06/29/16 0900  Gross per 24 hour  Intake              500 ml  Output              150 ml  Net              350 ml   Filed Weights   06/26/16 2040  Weight: 52 kg (114 lb 9.6 oz)    General appearance: alert, cooperative, appears stated age and no distress Resp: clear to auscultation bilaterally Cardio: regular rate and rhythm, S1, S2 normal, no murmur, click, rub or gallop GI: Abdomen is soft. Slightly tender diffusely, including the right upper quadrant. No rebound, rigidity or guarding. Murphy sign is negative.  Extremities: No bruising noted over the left thigh area. Dressing is present over her incision site. Neurologic: Awake and alert. Oriented 3. No focal neurological deficits  Lab Results:  Data Reviewed: I have personally reviewed following labs and imaging studies  CBC:  Recent Labs Lab 06/22/16 2225 06/26/16 2002 06/28/16 0430  06/29/16 0400  WBC 13.4* 7.5 7.0 8.4  NEUTROABS 10.8* 4.9  --   --   HGB 11.6* 9.4* 8.3* 7.7*  HCT 35.7* 30.1* 26.3* 23.7*  MCV 90.6 91.8 92.0 91.2  PLT 346 310 307 366    Basic Metabolic Panel:  Recent Labs Lab 06/22/16 2225 06/26/16 2002 06/28/16 0430 06/29/16 0400  NA 135 137 134* 136  K 4.2 3.9 3.9 3.7  CL 103 104 104 103  CO2 '25 26 25 25  '$ GLUCOSE 133* 204* 120* 110*  BUN 17 21* 12 15  CREATININE 0.67 0.68 0.61 0.68  CALCIUM 9.1 8.6* 8.4* 8.3*    GFR: Estimated Creatinine Clearance: 48.1 mL/min (by C-G formula based on SCr of 0.68 mg/dL).  Liver Function Tests:  Recent Labs Lab 06/26/16 2002  AST 47*  ALT 47   ALKPHOS 129*  BILITOT 0.6  PROT 5.9*  ALBUMIN 2.2*    Coagulation Profile:  Recent Labs Lab 06/26/16 2002  INR 1.20     Recent Results (from the past 240 hour(s))  MRSA PCR Screening     Status: None   Collection Time: 06/26/16  8:10 PM  Result Value Ref Range Status   MRSA by PCR NEGATIVE NEGATIVE Final    Comment:        The GeneXpert MRSA Assay (FDA approved for NASAL specimens only), is one component of a comprehensive MRSA colonization surveillance program. It is not intended to diagnose MRSA infection nor to guide or monitor treatment for MRSA infections.   Surgical pcr screen     Status: None   Collection Time: 06/26/16  8:10 PM  Result Value Ref Range Status   MRSA, PCR NEGATIVE NEGATIVE Final   Staphylococcus aureus NEGATIVE NEGATIVE Final    Comment:        The Xpert SA Assay (FDA approved for NASAL specimens in patients over 38 years of age), is one component of a comprehensive surveillance program.  Test performance has been validated by [facility] for patients greater than or equal to 33 year old. It is not intended to diagnose infection nor to guide or monitor treatment.       Radiology Studies: Pelvis Portable  Result Date: 06/27/2016 CLINICAL DATA:  Status post left hip replacement EXAM: PORTABLE PELVIS 1-2 VIEWS COMPARISON:  None. FINDINGS: Bilateral hip replacements are seen. No acute bony or soft tissue abnormality is noted. IMPRESSION: Status post left hip replacement Electronically Signed   By: Inez Catalina M.D.   On: 06/27/2016 19:44   Dg C-arm 1-60 Min  Result Date: 06/27/2016 CLINICAL DATA:  Left femoral fracture EXAM: OPERATIVE LEFT HIP WITH PELVIS; DG C-ARM 61-120 MIN COMPARISON:  None. FLUOROSCOPY TIME:  Radiation Exposure Index (as provided by the fluoroscopic device): Not available If the device does not provide the exposure index: Fluoroscopy Time:  34 seconds Number of Acquired Images:  3 FINDINGS: The left femoral neck  fracture is again identified. Subsequent hip replacement is noted in satisfactory position. No acute bony or soft tissue abnormality is noted. IMPRESSION: Status post left hip replacement Electronically Signed   By: Inez Catalina M.D.   On: 06/27/2016 19:43   Dg Hip Operative Unilat W Or W/o Pelvis Left  Result Date: 06/27/2016 CLINICAL DATA:  Left femoral fracture EXAM: OPERATIVE LEFT HIP WITH PELVIS; DG C-ARM 61-120 MIN COMPARISON:  None. FLUOROSCOPY TIME:  Radiation Exposure Index (as provided by the fluoroscopic device): Not available If the device does not provide the exposure index: Fluoroscopy Time:  34 seconds Number of Acquired Images:  3 FINDINGS: The left femoral neck fracture is again identified. Subsequent hip replacement is noted in satisfactory position. No acute bony or soft tissue abnormality is noted. IMPRESSION: Status post left hip replacement Electronically Signed   By: Inez Catalina M.D.   On: 06/27/2016 19:43     Medications:  Scheduled: . acidophilus  1 capsule Oral Daily  . aspirin EC  81 mg Oral Daily  . atorvastatin  40 mg Oral Daily  . carvedilol  3.125 mg Oral BID WC  . doxycycline  100 mg Oral BID WC  . enoxaparin (LOVENOX) injection  40 mg Subcutaneous Q24H  . feeding supplement (ENSURE ENLIVE)  237 mL Oral TID BM  . furosemide  20 mg Oral Daily  . lisinopril  5 mg Oral Daily  . mesalamine  2.4 g Oral Q breakfast  . mometasone-formoterol  2 puff Inhalation BID  . multivitamin with minerals  1 tablet Oral Daily  . pantoprazole  40 mg Oral Daily  . potassium chloride SA  20 mEq Oral Daily  . rifampin  600 mg Oral Daily  . sodium chloride flush  10-40 mL Intracatheter Q12H   Continuous:  DEY:CXKGYJEHUDJSH **OR** acetaminophen, alum & mag hydroxide-simeth, HYDROcodone-acetaminophen, menthol-cetylpyridinium **OR** phenol, methocarbamol **OR** methocarbamol (ROBAXIN)  IV, metoCLOPramide **OR** metoCLOPramide (REGLAN) injection, morphine injection, ondansetron  **OR** ondansetron (ZOFRAN) IV, oxyCODONE, sodium chloride flush, traMADol  Assessment/Plan:  Principal Problem:   Left displaced femoral neck fracture (HCC) Active Problems:   Essential hypertension   Coronary atherosclerosis   MRSA (methicillin resistant Staphylococcus aureus) infection   Hip fracture (HCC)   Pressure injury of skin    Displaced left femoral neck fracture. Patient underwent total hip replacement. Seems to be stable postoperatively. Pain control.   Vomiting Patient had a few episodes of emesis yesterday. AST is elevated ALT is normal. Bilirubin normal. Previous imaging reports reviewed. She did have an abdominal CT back in June which did show gallstones. Since she is mildly tender in the right upper quadrant, proceed with ultrasound of the right upper quadrant.   History of MRSA infection of the left knee prosthesis. Recently completed a course of intravenous vancomycin. She has a PICC line. Plan was to initiate oral rifampin and doxycycline, which has been done by Dr. Megan Salon with infectious disease. She will need to continue to take this for at least 6 months according to his note. So the end date will be 11/11/2016. PICC line will need to be pulled out prior to discharge.  Essential hypertension Continue with home medications. Monitor blood pressures closely.  Hyperglycemia Elevated blood glucose level noted on a non-fasting blood sample. No known history of diabetes. Outpatient monitoring.  Coronary artery disease Status post stents in the past. Appears to be stable.  Normocytic anemia Hemoglobin has dropped. This is most likely due to operative blood loss. Transfuse 1 unit of blood for now. Repeat hemoglobin tomorrow morning.   DVT Prophylaxis: Now on Lovenox  Code Status: DO NOT RESUSCITATE  Family Communication: Discussed with the patient. Discussed with her daughter-in-law who is at the bedside Disposition Plan: Await PT and OT evaluation. Will  likely need to go to SNF when ready for discharge.    LOS: 3 days   [facility] Pager [phone] 06/29/2016, 10:47 AM  If 7PM-7AM, please contact night-coverage at [email or web address], password [facility]

## 2016-06-30 ENCOUNTER — Inpatient Hospital Stay (HOSPITAL_COMMUNITY): Payer: Medicare Other

## 2016-06-30 DIAGNOSIS — M25562 Pain in left knee: Secondary | ICD-10-CM | POA: Diagnosis not present

## 2016-06-30 DIAGNOSIS — A4902 Methicillin resistant Staphylococcus aureus infection, unspecified site: Secondary | ICD-10-CM | POA: Diagnosis not present

## 2016-06-30 DIAGNOSIS — Z87891 Personal history of nicotine dependence: Secondary | ICD-10-CM | POA: Diagnosis not present

## 2016-06-30 DIAGNOSIS — L89620 Pressure ulcer of left heel, unstageable: Secondary | ICD-10-CM | POA: Diagnosis not present

## 2016-06-30 DIAGNOSIS — Z96652 Presence of left artificial knee joint: Secondary | ICD-10-CM | POA: Diagnosis not present

## 2016-06-30 DIAGNOSIS — D649 Anemia, unspecified: Secondary | ICD-10-CM | POA: Diagnosis not present

## 2016-06-30 DIAGNOSIS — S72002A Fracture of unspecified part of neck of left femur, initial encounter for closed fracture: Secondary | ICD-10-CM | POA: Diagnosis not present

## 2016-06-30 DIAGNOSIS — R2689 Other abnormalities of gait and mobility: Secondary | ICD-10-CM | POA: Diagnosis not present

## 2016-06-30 DIAGNOSIS — Z9181 History of falling: Secondary | ICD-10-CM | POA: Diagnosis not present

## 2016-06-30 DIAGNOSIS — I10 Essential (primary) hypertension: Secondary | ICD-10-CM | POA: Diagnosis not present

## 2016-06-30 DIAGNOSIS — R0989 Other specified symptoms and signs involving the circulatory and respiratory systems: Secondary | ICD-10-CM | POA: Diagnosis not present

## 2016-06-30 DIAGNOSIS — I2510 Atherosclerotic heart disease of native coronary artery without angina pectoris: Secondary | ICD-10-CM | POA: Diagnosis not present

## 2016-06-30 DIAGNOSIS — M25559 Pain in unspecified hip: Secondary | ICD-10-CM | POA: Diagnosis not present

## 2016-06-30 DIAGNOSIS — J449 Chronic obstructive pulmonary disease, unspecified: Secondary | ICD-10-CM | POA: Diagnosis not present

## 2016-06-30 DIAGNOSIS — S79911A Unspecified injury of right hip, initial encounter: Secondary | ICD-10-CM | POA: Diagnosis not present

## 2016-06-30 DIAGNOSIS — T8454XD Infection and inflammatory reaction due to internal left knee prosthesis, subsequent encounter: Secondary | ICD-10-CM | POA: Diagnosis not present

## 2016-06-30 DIAGNOSIS — M1990 Unspecified osteoarthritis, unspecified site: Secondary | ICD-10-CM | POA: Diagnosis not present

## 2016-06-30 DIAGNOSIS — Z8673 Personal history of transient ischemic attack (TIA), and cerebral infarction without residual deficits: Secondary | ICD-10-CM | POA: Diagnosis not present

## 2016-06-30 DIAGNOSIS — S72002D Fracture of unspecified part of neck of left femur, subsequent encounter for closed fracture with routine healing: Secondary | ICD-10-CM | POA: Diagnosis not present

## 2016-06-30 DIAGNOSIS — I723 Aneurysm of iliac artery: Secondary | ICD-10-CM | POA: Diagnosis not present

## 2016-06-30 DIAGNOSIS — S72009A Fracture of unspecified part of neck of unspecified femur, initial encounter for closed fracture: Secondary | ICD-10-CM | POA: Diagnosis not present

## 2016-06-30 DIAGNOSIS — Z96642 Presence of left artificial hip joint: Secondary | ICD-10-CM | POA: Diagnosis not present

## 2016-06-30 DIAGNOSIS — I714 Abdominal aortic aneurysm, without rupture: Secondary | ICD-10-CM | POA: Diagnosis not present

## 2016-06-30 DIAGNOSIS — M25552 Pain in left hip: Secondary | ICD-10-CM | POA: Diagnosis not present

## 2016-06-30 DIAGNOSIS — M6281 Muscle weakness (generalized): Secondary | ICD-10-CM | POA: Diagnosis not present

## 2016-06-30 DIAGNOSIS — E119 Type 2 diabetes mellitus without complications: Secondary | ICD-10-CM | POA: Diagnosis not present

## 2016-06-30 DIAGNOSIS — F4320 Adjustment disorder, unspecified: Secondary | ICD-10-CM | POA: Diagnosis not present

## 2016-06-30 DIAGNOSIS — R197 Diarrhea, unspecified: Secondary | ICD-10-CM | POA: Diagnosis not present

## 2016-06-30 DIAGNOSIS — E785 Hyperlipidemia, unspecified: Secondary | ICD-10-CM | POA: Diagnosis not present

## 2016-06-30 DIAGNOSIS — I739 Peripheral vascular disease, unspecified: Secondary | ICD-10-CM | POA: Diagnosis not present

## 2016-06-30 DIAGNOSIS — K8020 Calculus of gallbladder without cholecystitis without obstruction: Secondary | ICD-10-CM | POA: Diagnosis not present

## 2016-06-30 LAB — COMPREHENSIVE METABOLIC PANEL
ALBUMIN: 1.8 g/dL — AB (ref 3.5–5.0)
ALT: 40 U/L (ref 14–54)
AST: 41 U/L (ref 15–41)
Alkaline Phosphatase: 151 U/L — ABNORMAL HIGH (ref 38–126)
Anion gap: 6 (ref 5–15)
BUN: 11 mg/dL (ref 6–20)
CHLORIDE: 105 mmol/L (ref 101–111)
CO2: 24 mmol/L (ref 22–32)
Calcium: 8.4 mg/dL — ABNORMAL LOW (ref 8.9–10.3)
Creatinine, Ser: 0.65 mg/dL (ref 0.44–1.00)
GFR calc Af Amer: >60 mL/min (ref >60)
GFR calc non Af Amer: >60 mL/min (ref >60)
Glucose, Bld: 98 mg/dL (ref 65–99)
POTASSIUM: 3.7 mmol/L (ref 3.5–5.1)
Sodium: 135 mmol/L (ref 135–145)
Total Bilirubin: 0.9 mg/dL (ref 0.3–1.2)
Total Protein: 5.3 g/dL — ABNORMAL LOW (ref 6.5–8.1)

## 2016-06-30 LAB — CBC
HEMATOCRIT: 26.7 % — AB (ref 36.0–46.0)
Hemoglobin: 8.8 g/dL — ABNORMAL LOW (ref 12.0–15.0)
MCH: 29.4 pg (ref 26.0–34.0)
MCHC: 33 g/dL (ref 30.0–36.0)
MCV: 89.3 fL (ref 78.0–100.0)
PLATELETS: 300 10*3/uL (ref 150–400)
RBC: 2.99 MIL/uL — AB (ref 3.87–5.11)
RDW: 16.5 % — AB (ref 11.5–15.5)
WBC: 7.5 10*3/uL (ref 4.0–10.5)

## 2016-06-30 LAB — TYPE AND SCREEN
ABO/RH(D): O POS
ANTIBODY SCREEN: NEGATIVE
Unit division: 0

## 2016-06-30 MED ORDER — DOXYCYCLINE HYCLATE 100 MG PO TABS: 100.0000 mg | ORAL_TABLET | Freq: Two times a day (BID) | ORAL | Status: DC

## 2016-06-30 MED ORDER — RIFAMPIN 300 MG PO CAPS: 600.0000 mg | ORAL_CAPSULE | Freq: Every day | ORAL | 11 refills | Status: DC

## 2016-06-30 MED ORDER — METHOCARBAMOL 500 MG PO TABS
500.0000 mg | ORAL_TABLET | Freq: Four times a day (QID) | ORAL | 0 refills | Status: DC | PRN
Start: 2016-06-30 — End: 2017-01-15

## 2016-06-30 MED ORDER — ACETAMINOPHEN 325 MG PO TABS: 650.0000 mg | ORAL_TABLET | Freq: Four times a day (QID) | ORAL | Status: DC | PRN

## 2016-06-30 NOTE — Discharge Summary (Signed)
[facility]  Physician Discharge Summary   Patient ID: [name] MRN: [id number] DOB/AGE: July 16, 1941 75 y.o.  Admit date: 06/26/2016 Discharge date: 06/30/2016  PCP: Lamar Blinks, MD  DISCHARGE DIAGNOSES:  Principal Problem:   Left displaced femoral neck fracture (HCC) Active Problems:   Essential hypertension   Coronary atherosclerosis   MRSA (methicillin resistant Staphylococcus aureus) infection   Hip fracture (HCC)   Pressure injury of skin   RECOMMENDATIONS FOR OUTPATIENT FOLLOW UP: 1. Monitor CBGs periodically. 2. Please check HBA1c along with CBC and basic metabolic panel in 3-4 days.   DISCHARGE CONDITION: fair  Diet recommendation: Soft diet for the next 2 days and then advance to a heart healthy  Filed Weights   06/26/16 2040  Weight: 52 kg (114 lb 9.6 oz)    INITIAL HISTORY: 75 year old Caucasian female with a past medical history of CAD S/P 3 stents, HTN, HLD, and carotid artery disease. She has a complicated orthopedic history that began in March with total knee replacement on the left. This procedure was complicated by MRSA infection. She has required multiple surgeries since then, including multiple I and D's, a lateral gastrocnemius muscle flap repair, and a skin graft. She has been on multiple rounds of antibiotics. Most recently, she has been on IV vancomycin for the past six weeks (followed by Dr. Megan Salon). She has been in and out of SNFs this years but had actually improved to the point where she was back in her own home(a grandson lives with her and she has had home health support) and ambulating with a walker, until she fell on on 11/24. She presented to the ED. She complained of knee pain, but xrays of her left knee did not show acute fracture or dislocation. She was discharged in stable condition with plans to follow up in ortho clinic. In the ortho clinic, she complained of progressive left hip pain and ambulatory dysfunction  (she reports that she has essentially been wheelchair bound since her fall). She was referred for imaging of her left hip, which shows a displaced fracture of the left femoral neck. Hospitalist asked to facilitate direct admission.  Consultants: Orthopedics  Procedures: Total hip replacement and left hip. 11/29  HOSPITAL COURSE:   Displaced left femoral neck fracture. This was the result of a mechanical fall. Patient was seen by orthopedics. Patient underwent total hip replacement. Seems to be stable postoperatively. Pain control. Lovenox for DVT prophylaxis.  Vomiting Patient had a few episodes of emesis during this hospitalization. She had mildly abnormal LFTs. Ultrasound was done which showed gallstones without any evidence for cholecystitis. CBD was normal in size. Patient's symptoms have improved. Her abdomen is benign today. Symptoms could have been due to anesthesia and pain, or medications. She is feeling much better. She has tolerated her diet. She has been seen by physical therapy.  History of MRSA infection of the left knee prosthesis. Recently completed a course of intravenous vancomycin. She has a PICC line in place at the time of admission. Plan was to initiate oral rifampin and doxycycline, which has been done by Dr. Megan Salon with infectious disease. She will need to continue to take this for at least 6 months according to his note. So the end date will be 11/11/2016. PICC line will be pulled out today prior to discharge.  Essential hypertension Continue with home medications.   Hyperglycemia Elevated blood glucose level noted on a non-fasting blood sample. No known history of diabetes. Outpatient monitoring. Consider checking HbA1c.  Coronary  artery disease Status post stents in the past. Appears to be stable.  Normocytic anemia Hemoglobin dropped during this hospitalization most likely due to operative blood loss. Patient was transfused 1 unit of blood with good  response. No evidence for any overt bleeding.   Overall, stable. Feels better this morning. Discussed in detail with the patient and her son at bedside. Okay for discharge to SNF today. Social worker notified.    PERTINENT LABS:  The results of significant diagnostics from this hospitalization (including imaging, microbiology, ancillary and laboratory) are listed below for reference.    Microbiology: Recent Results (from the past 240 hour(s))  MRSA PCR Screening     Status: None   Collection Time: 06/26/16  8:10 PM  Result Value Ref Range Status   MRSA by PCR NEGATIVE NEGATIVE Final    Comment:        The GeneXpert MRSA Assay (FDA approved for NASAL specimens only), is one component of a comprehensive MRSA colonization surveillance program. It is not intended to diagnose MRSA infection nor to guide or monitor treatment for MRSA infections.   Surgical pcr screen     Status: None   Collection Time: 06/26/16  8:10 PM  Result Value Ref Range Status   MRSA, PCR NEGATIVE NEGATIVE Final   Staphylococcus aureus NEGATIVE NEGATIVE Final    Comment:        The Xpert SA Assay (FDA approved for NASAL specimens in patients over 18 years of age), is one component of a comprehensive surveillance program.  Test performance has been validated by [facility] for patients greater than or equal to 94 year old. It is not intended to diagnose infection nor to guide or monitor treatment.      Labs: Basic Metabolic Panel:  Recent Labs Lab 06/26/16 2002 06/28/16 0430 06/29/16 0400 06/30/16 0500  NA 137 134* 136 135  K 3.9 3.9 3.7 3.7  CL 104 104 103 105  CO2 '26 25 25 24  '$ GLUCOSE 204* 120* 110* 98  BUN 21* '12 15 11  '$ CREATININE 0.68 0.61 0.68 0.65  CALCIUM 8.6* 8.4* 8.3* 8.4*   Liver Function Tests:  Recent Labs Lab 06/26/16 2002 06/29/16 0912 06/30/16 0500  AST 47* 67* 41  ALT 47 52 40  ALKPHOS 129* 152* 151*  BILITOT 0.6 0.5 0.9  PROT 5.9* 5.8* 5.3*  ALBUMIN 2.2*  1.8* 1.8*   CBC:  Recent Labs Lab 06/26/16 2002 06/28/16 0430 06/29/16 0400 06/30/16 0500  WBC 7.5 7.0 8.4 7.5  NEUTROABS 4.9  --   --   --   HGB 9.4* 8.3* 7.7* 8.8*  HCT 30.1* 26.3* 23.7* 26.7*  MCV 91.8 92.0 91.2 89.3  PLT 310 307 263 300     IMAGING STUDIES Dg Knee 2 Views Left  Result Date: 06/22/2016 CLINICAL DATA:  Patient fell today with left knee swelling and increased pain. Previous knee replacement with hardware infection. EXAM: LEFT KNEE - 1-2 VIEW COMPARISON:  05/16/2016 FINDINGS: Left total knee arthroplasty with patellar femoral component. Components appear well seated. No lucency at the bone hardware interfaces to suggest loosening or infection. No evidence of acute fracture or dislocation of the left knee. No focal bone lesion or bone destruction. Vascular calcifications. Large calcification in the posterior fossa is unchanged since 09/19/2011. IMPRESSION: Postoperative left total knee arthroplasty. Components appear well seated. No acute bony abnormalities. Electronically Signed   By: Lucienne Capers M.D.   On: 06/22/2016 22:28   Pelvis Portable  Result Date: 06/27/2016  CLINICAL DATA:  Status post left hip replacement EXAM: PORTABLE PELVIS 1-2 VIEWS COMPARISON:  None. FINDINGS: Bilateral hip replacements are seen. No acute bony or soft tissue abnormality is noted. IMPRESSION: Status post left hip replacement Electronically Signed   By: Inez Catalina M.D.   On: 06/27/2016 19:44   Dg Chest Port 1 View  Result Date: 06/27/2016 CLINICAL DATA:  PICC line placement EXAM: PORTABLE CHEST 1 VIEW COMPARISON:  None. FINDINGS: Right-sided PICC line tip overlies the lower SVC. There is aortic atherosclerosis. No focal airspace consolidation or pulmonary edema. No pneumothorax or sizable pleural effusion. IMPRESSION: PICC line tip overlying the lower SVC.  Aortic atherosclerosis. Electronically Signed   By: Ulyses Jarred M.D.   On: 06/27/2016 00:31   Dg C-arm 1-60 Min  Result  Date: 06/27/2016 CLINICAL DATA:  Left femoral fracture EXAM: OPERATIVE LEFT HIP WITH PELVIS; DG C-ARM 61-120 MIN COMPARISON:  None. FLUOROSCOPY TIME:  Radiation Exposure Index (as provided by the fluoroscopic device): Not available If the device does not provide the exposure index: Fluoroscopy Time:  34 seconds Number of Acquired Images:  3 FINDINGS: The left femoral neck fracture is again identified. Subsequent hip replacement is noted in satisfactory position. No acute bony or soft tissue abnormality is noted. IMPRESSION: Status post left hip replacement Electronically Signed   By: Inez Catalina M.D.   On: 06/27/2016 19:43   Dg Hip Operative Unilat W Or W/o Pelvis Left  Result Date: 06/27/2016 CLINICAL DATA:  Left femoral fracture EXAM: OPERATIVE LEFT HIP WITH PELVIS; DG C-ARM 61-120 MIN COMPARISON:  None. FLUOROSCOPY TIME:  Radiation Exposure Index (as provided by the fluoroscopic device): Not available If the device does not provide the exposure index: Fluoroscopy Time:  34 seconds Number of Acquired Images:  3 FINDINGS: The left femoral neck fracture is again identified. Subsequent hip replacement is noted in satisfactory position. No acute bony or soft tissue abnormality is noted. IMPRESSION: Status post left hip replacement Electronically Signed   By: Inez Catalina M.D.   On: 06/27/2016 19:43   Xr Hip Unilat W Or W/o Pelvis 2-3 Views Left  Result Date: 06/26/2016 Displaced left femoral neck fracture.  US Abdomen Limited Ruq  Result Date: 06/30/2016 CLINICAL DATA:  Abdominal pain and vomiting for 1 day. EXAM: US ABDOMEN LIMITED - RIGHT UPPER QUADRANT COMPARISON:  None. FINDINGS: Gallbladder: Layering stones and sludge are seen in the gallbladder with no Murphy's sign, wall thickening, or pericholecystic fluid. Common bile duct: Diameter: 4 mm Liver: No focal lesion identified. Within normal limits in parenchymal echogenicity. Known right renal cyst. IMPRESSION: 1. Layering stones and sludge in the  gallbladder with no wall thickening, pericholecystic fluid, or Murphy's sign. No other acute abnormalities. Electronically Signed   By: Dorise Bullion III M.D   On: 06/30/2016 10:43    DISCHARGE EXAMINATION: Vitals:   06/29/16 1715 06/30/16 0207 06/30/16 0528 06/30/16 0933  BP: (!) 147/62 (!) 172/62 (!) 151/57   Pulse: 80 91 86   Resp: '18 18 16   '$ Temp: 97.6 F (36.4 C) 99 F (37.2 C) 98.3 F (36.8 C)   TempSrc: Oral Axillary Oral   SpO2: 100% 97% 95% 96%  Weight:      Height:       General appearance: alert, cooperative, appears stated age and no distress Resp: clear to auscultation bilaterally Cardio: regular rate and rhythm, S1, S2 normal, no murmur, click, rub or gallop GI: soft, non-tender; bowel sounds normal; no masses,  no organomegaly Extremities:  extremities normal, atraumatic, no cyanosis or edema   DISPOSITION: SNF  Discharge Instructions    Call MD for:  difficulty breathing, headache or visual disturbances    Complete by:  As directed    Call MD for:  extreme fatigue    Complete by:  As directed    Call MD for:  persistant dizziness or light-headedness    Complete by:  As directed    Call MD for:  persistant nausea and vomiting    Complete by:  As directed    Call MD for:  severe uncontrolled pain    Complete by:  As directed    Call MD for:  temperature >100.4    Complete by:  As directed    Discharge instructions    Complete by:  As directed    Monitor CBGs periodically. Soft diet for the next 2 days and then advance to heart healthy.  You were cared for by a hospitalist during your hospital stay. If you have any questions about your discharge medications or the care you received while you were in the hospital after you are discharged, you can call the unit and asked to speak with the hospitalist on call if the hospitalist that took care of you is not available. Once you are discharged, your primary care physician will handle any further medical issues.  Please note that NO REFILLS for any discharge medications will be authorized once you are discharged, as it is imperative that you return to your primary care physician (or establish a relationship with a primary care physician if you do not have one) for your aftercare needs so that they can reassess your need for medications and monitor your lab values. If you do not have a primary care physician, you can call [phone] for a physician referral.   Increase activity slowly    Complete by:  As directed    Weight bearing as tolerated    Complete by:  As directed       ALLERGIES: No Known Allergies   Current Discharge Medication List    START taking these medications   Details  acetaminophen (TYLENOL) 325 MG tablet Take 2 tablets (650 mg total) by mouth every 6 (six) hours as needed for mild pain (or Fever >/= 101).    doxycycline (VIBRA-TABS) 100 MG tablet Take 1 tablet (100 mg total) by mouth 2 (two) times daily with a meal. LONG TERM TREATMENT: TILL 11/11/2016    enoxaparin (LOVENOX) 40 MG/0.4ML injection Inject 0.4 mLs (40 mg total) into the skin daily. Qty: 14 Syringe, Refills: 0    HYDROcodone-acetaminophen (NORCO) 7.5-325 MG tablet Take 1-2 tablets by mouth every 6 (six) hours as needed for moderate pain. Qty: 90 tablet, Refills: 0    methocarbamol (ROBAXIN) 500 MG tablet Take 1 tablet (500 mg total) by mouth every 6 (six) hours as needed for muscle spasms. Qty: 30 tablet, Refills: 0      CONTINUE these medications which have CHANGED   Details  rifampin (RIFADIN) 300 MG capsule Take 2 capsules (600 mg total) by mouth daily. LONG TERM TREATMENT: TILL 11/11/2016 Qty: 60 capsule, Refills: 11   Associated Diagnoses: Infection associated with internal left knee prosthesis, initial encounter ([place])      CONTINUE these medications which have NOT CHANGED   Details  acidophilus (RISAQUAD) CAPS capsule Take 1 capsule by mouth daily.    aspirin EC 81 MG tablet Take 81 mg by mouth daily.     atorvastatin (LIPITOR) 40  MG tablet Take 1 tablet (40 mg total) by mouth daily. Qty: 30 tablet, Refills: 4    budesonide-formoterol (SYMBICORT) 160-4.5 MCG/ACT inhaler Inhale 2 puffs into the lungs 2 (two) times daily.    carvedilol (COREG) 3.125 MG tablet Take 1 tablet (3.125 mg total) by mouth 2 (two) times daily with a meal. Qty: 60 tablet, Refills: 11    colchicine 0.6 MG tablet TAKE ONE TO TWO TABLETS DAILY AS NEEDED FOR GOUT. Qty: 60 tablet, Refills: 0    feeding supplement, ENSURE ENLIVE, (ENSURE ENLIVE) LIQD Take 237 mLs by mouth 3 (three) times daily between meals. Qty: 237 mL, Refills: 12    furosemide (LASIX) 20 MG tablet Take 1 tablet (20 mg total) by mouth daily. Qty: 30 tablet, Refills: 6    HYDROcodone-acetaminophen (NORCO/VICODIN) 5-325 MG tablet Take 1 tablet by mouth every 4 (four) hours as needed. Qty: 6 tablet, Refills: 0    lisinopril (PRINIVIL,ZESTRIL) 5 MG tablet Take 1 tablet (5 mg total) by mouth daily. Qty: 30 tablet, Refills: 12    mesalamine (LIALDA) 1.2 g EC tablet Take 2.4 g by mouth daily with breakfast.    Multiple Vitamin (MULITIVITAMIN WITH MINERALS) TABS Take 1 tablet by mouth daily.    pantoprazole (PROTONIX) 40 MG tablet Take 40 mg by mouth daily.    potassium chloride SA (K-DUR,KLOR-CON) 20 MEQ tablet Take 1 tablet (20 mEq total) by mouth daily. Qty: 30 tablet, Refills: 6      STOP taking these medications     vancomycin (VANCOCIN) 10 G SOLR injection          Follow-up Information    Eduard Roux, MD Follow up in 2 week(s).   Specialty:  Orthopedic Surgery Why:  For suture removal, For wound re-check Contact information: [place] [address] [phone]        Michel Bickers, MD. Schedule an appointment as soon as possible for a visit in 7 week(s).   Specialty:  Infectious Diseases Contact information: [address] [place] [address] [phone]         Lamar Blinks, MD. Schedule an appointment as soon as possible for a visit in 3 day(s).   Specialty:  Family Medicine Contact information: [place] [address] [phone]           TOTAL DISCHARGE TIME: 81 minutes  [facility] Pager [phone]  06/30/2016, 11:05 AM

## 2016-06-30 NOTE — Clinical Social Work Placement (Addendum)
   CLINICAL SOCIAL WORK PLACEMENT  NOTE  Date:  06/30/2016  Patient Details  Name: [name] MRN: [id number] Date of Birth: [birth year]  Clinical Social Work is seeking post-discharge placement for this patient at the [place] level of care (*CSW will initial, date and re-position this form in  chart as items are completed):  Yes   Patient/family provided with [facility] Work Department's list of facilities offering this level of care within the geographic area requested by the patient (or if unable, by the patient's family).  Yes   Patient/family informed of their freedom to choose among providers that offer the needed level of care, that participate in Medicare, Medicaid or managed care program needed by the patient, have an available bed and are willing to accept the patient.      Patient/family informed of [facility]'s ownership interest in [facility] and [facility], as well as of the fact that they are under no obligation to receive care at these facilities.  PASRR submitted to EDS on       PASRR number received on       Existing PASRR number confirmed on 06/30/16     FL2 transmitted to all facilities in geographic area requested by pt/family on 06/30/16     FL2 transmitted to all facilities within larger geographic area on       Patient informed that his/her managed care company has contracts with or will negotiate with certain facilities, including the following:        Yes   Patient/family informed of bed offers received.  Patient chooses bed at [facility] [place] recommends and patient chooses bed at      Patient to be transferred to [facility] on 06/30/16.  Patient to be transferred to facility by Ambulance     Patient family notified on 06/30/16 of transfer.  Name of family member notified:  [name]     PHYSICIAN Please prepare priority discharge summary, including medications, Please  prepare prescriptions, Please sign FL2, Please sign DNR     Additional Comment:  Meg with [facility] confirms that patient can return today. RN will call for transport once the patient is ready to leave the hospital (after PICC removal). Nurse will call son once transport picks patient up. CSW signing off.   _______________________________________________ Rigoberto Noel, LCSW 06/30/2016, 11:32 AM

## 2016-06-30 NOTE — Progress Notes (Signed)
Physical Therapy Treatment Patient Details Name: [name] MRN: [id number] DOB: [birth year] Today's Date: 06/30/2016    History of Present Illness 75 y.o.woman with a history of CAD S/P 3 stents, HTN, HLD, and carotid artery disease. Pt presenting s/p fall on 11/24. Initially she c/o L knee pain then progressive L hip pain and ambulatory dysfunction. Imaging of L hip shoed a displaced fx of the L femoral neck. Now s/p L THA direct antrior approach. Patient recieving blood but cleared by nursing to see for bed to chair transfer.     PT Comments    Pt is making steady progress toward goals. Acute PT to continue during pt's hospital stay.  Follow Up Recommendations  SNF     Equipment Recommendations  Other (comment) (TBD at next venue of care)       Precautions / Restrictions Precautions Precautions: Fall Precaution Comments: direct anterior THA Restrictions LLE Weight Bearing: Weight bearing as tolerated    Mobility  Bed Mobility Overal bed mobility: Needs Assistance Bed Mobility: Supine to Sit     Supine to sit: Min assist;HOB elevated;Mod assist     General bed mobility comments: Pt able to assist with moving both legs toward edge of bed. with HOB elevated and rail pt assisted with lifting trunk into upright position. min/mod assist needed with trunk movements, to completely get legs to and off edge of bed. pad under pt's hips was used to scoot pt to edge of bed with mod assist.  Transfers Overall transfer level: Needs assistance Equipment used: Rolling walker (2 wheeled) Transfers: Sit to/from [facility] Sit to Stand: Min assist;+2 safety/equipment         General transfer comment: assist needed to stand from bed and pivot with walker to chair. cues on sequencing and technique needed as well. pt able to take 3-4 pivot steps with stand pivot transfer with minimal weight bearing on left LE.      Cognition Arousal/Alertness:  Awake/alert Behavior During Therapy: WFL for tasks assessed/performed Overall Cognitive Status: Within Functional Limits for tasks assessed                      Exercises Total Joint Exercises Ankle Circles/Pumps: AROM;Strengthening;Both;10 reps;Supine Quad Sets: AROM;Strengthening;Both;5 reps;Supine Heel Slides: AROM;Strengthening;Left;5 reps;Supine     Pertinent Vitals/Pain Pain Assessment: 0-10 Pain Score: 6  Pain Location: left knee and hip Pain Descriptors / Indicators: Aching;Sore Pain Intervention(s): Limited activity within patient's tolerance;Monitored during session;Premedicated before session;Repositioned     PT Goals (current goals can now be found in the care plan section) Acute Rehab PT Goals Patient Stated Goal: rehab before home PT Goal Formulation: With patient Time For Goal Achievement: 07/13/16 Potential to Achieve Goals: Good Progress towards PT goals: Progressing toward goals    Frequency    7X/week      PT Plan Current plan remains appropriate    End of Session Equipment Utilized During Treatment: Gait belt Activity Tolerance: Patient tolerated treatment well;Patient limited by pain;Patient limited by fatigue Patient left: in chair;with call bell/phone within reach;with chair alarm set;with family/visitor present     Time: 6314-9702 PT Time Calculation (min) (ACUTE ONLY): 17 min  Charges:  $Therapeutic Activity: 8-22 mins           Willow Ora 06/30/2016, 12:07 PM   Willow Ora, PTA, CLT Acute Rehab Services [phone] 06/30/16, 12:08 PM

## 2016-06-30 NOTE — NC FL2 (Signed)
[facility] LEVEL OF CARE SCREENING TOOL     IDENTIFICATION  Patient Name: [name] Birthdate: [birth year] Sex: female Admission Date (Current Location): 06/26/2016  [facility] and [place] Number:  Herbalist and Address:  [facility], [place] [address], [place], [place] [address]      Provider Number: [id number]  Attending Physician Name and Address:  Bonnielee Haff, MD  Relative Name and Phone Number:       Current Level of Care: Hospital Recommended Level of Care: [place] Prior Approval Number:    Date Approved/Denied:   PASRR Number: [id number]  Discharge Plan: SNF    Current Diagnoses: Patient Active Problem List   Diagnosis Date Noted  . Pressure injury of skin 06/27/2016  . Left displaced femoral neck fracture ([place]) 06/26/2016  . MRSA (methicillin resistant Staphylococcus aureus) infection 06/26/2016  . Hip fracture ([place]) 06/26/2016  . Hypertensive heart disease   . Herpetic lesions 01/28/2016  . Hydronephrosis   . Acute on chronic systolic heart failure ([place])   . Obstructive uropathy   . Hypokalemia   . Elevated troponin 01/23/2016  . Abnormal echocardiogram 01/23/2016  . Hypoxia   . Pressure ulcer 01/21/2016  . Pyonephrosis   . Hypotension 12/28/2015  . Open knee wound 11/16/2015  . Wound dehiscence, surgical 10/18/2015  . Chronic infection of prosthetic knee ([place]) 10/12/2015  . Total knee replacement status 09/28/2015  . Bruit 07/10/2012  . Leukocytosis 09/19/2011  . Encephalopathy 09/19/2011  . AAA 01/19/2009  . ILIAC ARTERY ANEURYSM 01/19/2009  . Hyperlipidemia 01/14/2009  . TOBACCO ABUSE 01/14/2009  . Essential hypertension 01/14/2009  . Coronary atherosclerosis 01/14/2009  . Cerebrovascular disease 01/14/2009  . Peripheral vascular disease ([place]) 01/14/2009  . CHRONIC OBSTRUCTIVE PULMONARY DISEASE 01/14/2009  . CHEST PAIN-UNSPECIFIED 01/14/2009    Orientation RESPIRATION  BLADDER Height & Weight     Self, Time, Situation, Place  Normal Incontinent Weight: 52 kg (114 lb 9.6 oz) Height:  '5\' 2"'$  (157.5 cm)  BEHAVIORAL SYMPTOMS/MOOD NEUROLOGICAL BOWEL NUTRITION STATUS   (NONE)  (NONE) Incontinent Diet (Soft Diet)  AMBULATORY STATUS COMMUNICATION OF NEEDS Skin   Extensive Assist Verbally PU Stage and Appropriate Care, Surgical wounds                       Personal Care Assistance Level of Assistance  Bathing, Dressing, Feeding Bathing Assistance: Limited assistance Feeding assistance: Independent Dressing Assistance: Limited assistance     Functional Limitations Info  Sight, Hearing, Speech Sight Info: Adequate Hearing Info: Adequate Speech Info: Adequate    SPECIAL CARE FACTORS FREQUENCY  PT (By licensed PT), OT (By licensed OT)     PT Frequency: 5/week OT Frequency: 5/week            Contractures Contractures Info: Not present    Additional Factors Info  Isolation Precautions, Code Status, Allergies Code Status Info: DNR Allergies Info: NKDA     Isolation Precautions Info: 10/15/15 + MRSA left knee     Current Medications (06/30/2016):  This is the current hospital active medication list Current Facility-Administered Medications  Medication Dose Route Frequency Provider Last Rate Last Dose  . acetaminophen (TYLENOL) tablet 650 mg  650 mg Oral Q6H PRN Leandrew Koyanagi, MD   650 mg at 06/29/16 1247   Or  . acetaminophen (TYLENOL) suppository 650 mg  650 mg Rectal Q6H PRN Leandrew Koyanagi, MD      . acidophilus (RISAQUAD) capsule  1 capsule  1 capsule Oral Daily Lily Kocher, MD   1 capsule at 06/29/16 1028  . alum & mag hydroxide-simeth (MAALOX/MYLANTA) 200-200-20 MG/5ML suspension 30 mL  30 mL Oral Q4H PRN Leandrew Koyanagi, MD      . aspirin EC tablet 81 mg  81 mg Oral Daily Lily Kocher, MD   81 mg at 06/29/16 1028  . atorvastatin (LIPITOR) tablet 40 mg  40 mg Oral Daily Lily Kocher, MD   40 mg at 06/29/16 1028  . carvedilol (COREG) tablet  3.125 mg  3.125 mg Oral BID WC Lily Kocher, MD   3.125 mg at 06/29/16 1724  . doxycycline (VIBRA-TABS) tablet 100 mg  100 mg Oral BID WC Lily Kocher, MD   100 mg at 06/29/16 1724  . enoxaparin (LOVENOX) injection 40 mg  40 mg Subcutaneous Q24H Naiping Ephriam Jenkins, MD   40 mg at 06/29/16 1027  . feeding supplement (ENSURE ENLIVE) (ENSURE ENLIVE) liquid 237 mL  237 mL Oral TID BM Lily Kocher, MD   237 mL at 06/29/16 1400  . furosemide (LASIX) tablet 20 mg  20 mg Oral Daily Lily Kocher, MD   20 mg at 06/29/16 1027  . HYDROcodone-acetaminophen (NORCO/VICODIN) 5-325 MG per tablet 1-2 tablet  1-2 tablet Oral Q6H PRN Leandrew Koyanagi, MD   2 tablet at 06/28/16 2318  . lisinopril (PRINIVIL,ZESTRIL) tablet 5 mg  5 mg Oral Daily Lily Kocher, MD   5 mg at 06/29/16 1028  . menthol-cetylpyridinium (CEPACOL) lozenge 3 mg  1 lozenge Oral PRN Naiping Ephriam Jenkins, MD       Or  . phenol (CHLORASEPTIC) mouth spray 1 spray  1 spray Mouth/Throat PRN Naiping Ephriam Jenkins, MD      . mesalamine (LIALDA) EC tablet 2.4 g  2.4 g Oral Q breakfast Lily Kocher, MD   2.4 g at 06/29/16 1027  . methocarbamol (ROBAXIN) tablet 500 mg  500 mg Oral Q6H PRN Naiping Ephriam Jenkins, MD       Or  . methocarbamol (ROBAXIN) 500 mg in dextrose 5 % 50 mL IVPB  500 mg Intravenous Q6H PRN Naiping Ephriam Jenkins, MD      . metoCLOPramide (REGLAN) tablet 5-10 mg  5-10 mg Oral Q8H PRN Naiping Ephriam Jenkins, MD       Or  . metoCLOPramide (REGLAN) injection 5-10 mg  5-10 mg Intravenous Q8H PRN Naiping Ephriam Jenkins, MD      . mometasone-formoterol [facility]) 200-5 MCG/ACT inhaler 2 puff  2 puff Inhalation BID Lily Kocher, MD   2 puff at 06/30/16 0931  . morphine 2 MG/ML injection 0.5 mg  0.5 mg Intravenous Q2H PRN Leandrew Koyanagi, MD   0.5 mg at 06/28/16 1059  . multivitamin with minerals tablet 1 tablet  1 tablet Oral Daily Lily Kocher, MD   1 tablet at 06/29/16 1027  . ondansetron (ZOFRAN) tablet 4 mg  4 mg Oral Q6H PRN Naiping Ephriam Jenkins, MD       Or  . ondansetron [facility]) injection 4 mg  4 mg Intravenous  Q6H PRN Leandrew Koyanagi, MD   4 mg at 06/28/16 1720  . oxyCODONE (Oxy IR/ROXICODONE) immediate release tablet 5-10 mg  5-10 mg Oral Q4H PRN Leandrew Koyanagi, MD   10 mg at 06/29/16 0201  . pantoprazole (PROTONIX) EC tablet 40 mg  40 mg Oral Daily Lily Kocher, MD   40 mg at 06/29/16 1028  . potassium chloride SA (K-DUR,KLOR-CON) CR tablet 20 mEq  20 mEq Oral Daily Lily Kocher, MD   20 mEq at 06/29/16 1028  . rifampin (RIFADIN) capsule 600 mg  600 mg Oral Daily Lily Kocher, MD   600 mg at 06/29/16 1027  . sodium chloride flush (NS) 0.9 % injection 10-40 mL  10-40 mL Intracatheter Q12H Belkys A Regalado, MD   10 mL at 06/26/16 2009  . sodium chloride flush (NS) 0.9 % injection 10-40 mL  10-40 mL Intracatheter PRN Belkys A Regalado, MD   10 mL at 06/30/16 0503  . traMADol (ULTRAM) tablet 50 mg  50 mg Oral Q6H PRN Lily Kocher, MD   50 mg at 06/29/16 2040     Discharge Medications: Please see discharge summary for a list of discharge medications.  Relevant Imaging Results:  Relevant Lab Results:   Additional Information SSN: [id number]  Rigoberto Noel, LCSW

## 2016-07-02 DIAGNOSIS — S72002D Fracture of unspecified part of neck of left femur, subsequent encounter for closed fracture with routine healing: Secondary | ICD-10-CM | POA: Diagnosis not present

## 2016-07-02 DIAGNOSIS — Z9181 History of falling: Secondary | ICD-10-CM | POA: Diagnosis not present

## 2016-07-02 DIAGNOSIS — T8454XD Infection and inflammatory reaction due to internal left knee prosthesis, subsequent encounter: Secondary | ICD-10-CM | POA: Diagnosis not present

## 2016-07-02 DIAGNOSIS — I739 Peripheral vascular disease, unspecified: Secondary | ICD-10-CM | POA: Diagnosis not present

## 2016-07-03 ENCOUNTER — Encounter: Payer: Self-pay | Admitting: Internal Medicine

## 2016-07-03 DIAGNOSIS — M25552 Pain in left hip: Secondary | ICD-10-CM | POA: Diagnosis not present

## 2016-07-03 DIAGNOSIS — M25562 Pain in left knee: Secondary | ICD-10-CM | POA: Diagnosis not present

## 2016-07-03 DIAGNOSIS — R2689 Other abnormalities of gait and mobility: Secondary | ICD-10-CM | POA: Diagnosis not present

## 2016-07-03 DIAGNOSIS — M6281 Muscle weakness (generalized): Secondary | ICD-10-CM | POA: Diagnosis not present

## 2016-07-04 ENCOUNTER — Ambulatory Visit (INDEPENDENT_AMBULATORY_CARE_PROVIDER_SITE_OTHER): Payer: Medicare Other | Admitting: Orthopedic Surgery

## 2016-07-10 ENCOUNTER — Ambulatory Visit (INDEPENDENT_AMBULATORY_CARE_PROVIDER_SITE_OTHER): Payer: Medicare Other | Admitting: Orthopaedic Surgery

## 2016-07-10 ENCOUNTER — Ambulatory Visit (INDEPENDENT_AMBULATORY_CARE_PROVIDER_SITE_OTHER): Payer: No Typology Code available for payment source

## 2016-07-10 ENCOUNTER — Encounter (INDEPENDENT_AMBULATORY_CARE_PROVIDER_SITE_OTHER): Payer: Self-pay | Admitting: Orthopaedic Surgery

## 2016-07-10 DIAGNOSIS — S72002A Fracture of unspecified part of neck of left femur, initial encounter for closed fracture: Secondary | ICD-10-CM | POA: Diagnosis not present

## 2016-07-10 LAB — BASIC METABOLIC PANEL
BUN: 17 mg/dL (ref 4–21)
Creatinine: 0.8 mg/dL (ref 0.5–1.1)
Potassium: 4.7 mmol/L (ref 3.4–5.3)
Sodium: 134 mmol/L — AB (ref 137–147)

## 2016-07-10 LAB — HEPATIC FUNCTION PANEL
ALT: 16 U/L (ref 7–35)
AST: 17 U/L (ref 13–35)
Alkaline Phosphatase: 158 U/L — AB (ref 25–125)
BILIRUBIN, TOTAL: 0.3 mg/dL

## 2016-07-10 LAB — CBC AND DIFFERENTIAL
HEMATOCRIT: 33 % — AB (ref 36–46)
HEMOGLOBIN: 10.6 g/dL — AB (ref 12.0–16.0)
PLATELETS: 521 10*3/uL — AB (ref 150–399)
WBC: 6.6 10^3/mL

## 2016-07-10 LAB — HEMOGLOBIN A1C: Hemoglobin A1C: 4.9

## 2016-07-10 NOTE — Progress Notes (Signed)
[name] is 2 weeks status post left total hip replacement for a femoral neck fracture. She is complaining of mainly knee pain. She is ambulating minimally due to the pain in her quadriceps and knee. The surgical incisions are nicely healed. She does have a slight small seroma.. There is no drainage from the incision. There is no erythema or cellulitis. AP pelvis shows a stable total hip replacement. Patient is progressing appropriately. Continue with physical therapy at skilled nursing facility. Aggressive icing warm compresses for the seroma. Follow-up in 4 weeks for clinical recheck.

## 2016-07-11 ENCOUNTER — Telehealth (INDEPENDENT_AMBULATORY_CARE_PROVIDER_SITE_OTHER): Payer: Self-pay | Admitting: *Deleted

## 2016-07-11 NOTE — Telephone Encounter (Signed)
Yes 325 daily

## 2016-07-11 NOTE — Telephone Encounter (Signed)
Called and [name] answered advised on message

## 2016-07-11 NOTE — Telephone Encounter (Signed)
Nurse at [facility] called to clarify orders from Dr. Erlinda Hong. She will be in until 3 then another nurse will be there to take the information

## 2016-07-11 NOTE — Telephone Encounter (Signed)
Called Nurse back she wants to know if patient should take Regular aspirin, Aspirin 325? Needs clarification. Please Advise. Thank you

## 2016-07-12 DIAGNOSIS — L89620 Pressure ulcer of left heel, unstageable: Secondary | ICD-10-CM | POA: Diagnosis not present

## 2016-07-17 DIAGNOSIS — I2510 Atherosclerotic heart disease of native coronary artery without angina pectoris: Secondary | ICD-10-CM | POA: Diagnosis not present

## 2016-07-17 DIAGNOSIS — I10 Essential (primary) hypertension: Secondary | ICD-10-CM | POA: Diagnosis not present

## 2016-07-17 DIAGNOSIS — M25552 Pain in left hip: Secondary | ICD-10-CM | POA: Diagnosis not present

## 2016-07-17 DIAGNOSIS — Z9181 History of falling: Secondary | ICD-10-CM | POA: Diagnosis not present

## 2016-07-17 DIAGNOSIS — R197 Diarrhea, unspecified: Secondary | ICD-10-CM | POA: Diagnosis not present

## 2016-07-17 DIAGNOSIS — R2689 Other abnormalities of gait and mobility: Secondary | ICD-10-CM | POA: Diagnosis not present

## 2016-07-17 DIAGNOSIS — M25562 Pain in left knee: Secondary | ICD-10-CM | POA: Diagnosis not present

## 2016-07-21 DIAGNOSIS — S72002D Fracture of unspecified part of neck of left femur, subsequent encounter for closed fracture with routine healing: Secondary | ICD-10-CM | POA: Diagnosis not present

## 2016-07-21 DIAGNOSIS — I5022 Chronic systolic (congestive) heart failure: Secondary | ICD-10-CM | POA: Diagnosis not present

## 2016-07-21 DIAGNOSIS — I110 Hypertensive heart disease with heart failure: Secondary | ICD-10-CM | POA: Diagnosis not present

## 2016-07-21 DIAGNOSIS — T8454XA Infection and inflammatory reaction due to internal left knee prosthesis, initial encounter: Secondary | ICD-10-CM | POA: Diagnosis not present

## 2016-07-21 DIAGNOSIS — I739 Peripheral vascular disease, unspecified: Secondary | ICD-10-CM | POA: Diagnosis not present

## 2016-07-21 DIAGNOSIS — L89620 Pressure ulcer of left heel, unstageable: Secondary | ICD-10-CM | POA: Diagnosis not present

## 2016-07-25 ENCOUNTER — Encounter: Payer: Self-pay | Admitting: Behavioral Health

## 2016-07-25 ENCOUNTER — Telehealth: Payer: Self-pay | Admitting: Behavioral Health

## 2016-07-25 DIAGNOSIS — I5022 Chronic systolic (congestive) heart failure: Secondary | ICD-10-CM | POA: Diagnosis not present

## 2016-07-25 DIAGNOSIS — S72002D Fracture of unspecified part of neck of left femur, subsequent encounter for closed fracture with routine healing: Secondary | ICD-10-CM | POA: Diagnosis not present

## 2016-07-25 DIAGNOSIS — I110 Hypertensive heart disease with heart failure: Secondary | ICD-10-CM | POA: Diagnosis not present

## 2016-07-25 DIAGNOSIS — I739 Peripheral vascular disease, unspecified: Secondary | ICD-10-CM | POA: Diagnosis not present

## 2016-07-25 DIAGNOSIS — T8454XA Infection and inflammatory reaction due to internal left knee prosthesis, initial encounter: Secondary | ICD-10-CM | POA: Diagnosis not present

## 2016-07-25 DIAGNOSIS — L89620 Pressure ulcer of left heel, unstageable: Secondary | ICD-10-CM | POA: Diagnosis not present

## 2016-07-25 NOTE — Telephone Encounter (Signed)
Pre-Visit Call completed with patient and chart updated.   Pre-Visit Info documented in Specialty Comments under SnapShot.    

## 2016-07-26 ENCOUNTER — Encounter: Payer: Self-pay | Admitting: Family Medicine

## 2016-07-26 ENCOUNTER — Ambulatory Visit (INDEPENDENT_AMBULATORY_CARE_PROVIDER_SITE_OTHER): Payer: Medicare Other | Admitting: Family Medicine

## 2016-07-26 ENCOUNTER — Telehealth: Payer: Self-pay | Admitting: Family Medicine

## 2016-07-26 VITALS — BP 131/71 | HR 87 | Ht 62.0 in | Wt 106.2 lb

## 2016-07-26 DIAGNOSIS — I2510 Atherosclerotic heart disease of native coronary artery without angina pectoris: Secondary | ICD-10-CM | POA: Diagnosis not present

## 2016-07-26 DIAGNOSIS — D62 Acute posthemorrhagic anemia: Secondary | ICD-10-CM

## 2016-07-26 DIAGNOSIS — A4902 Methicillin resistant Staphylococcus aureus infection, unspecified site: Secondary | ICD-10-CM | POA: Diagnosis not present

## 2016-07-26 DIAGNOSIS — Z8781 Personal history of (healed) traumatic fracture: Secondary | ICD-10-CM | POA: Diagnosis not present

## 2016-07-26 DIAGNOSIS — E441 Mild protein-calorie malnutrition: Secondary | ICD-10-CM

## 2016-07-26 DIAGNOSIS — R5381 Other malaise: Secondary | ICD-10-CM | POA: Diagnosis not present

## 2016-07-26 DIAGNOSIS — M810 Age-related osteoporosis without current pathological fracture: Secondary | ICD-10-CM

## 2016-07-26 DIAGNOSIS — L89629 Pressure ulcer of left heel, unspecified stage: Secondary | ICD-10-CM

## 2016-07-26 LAB — COMPREHENSIVE METABOLIC PANEL
ALT: 11 U/L (ref 0–35)
AST: 15 U/L (ref 0–37)
Albumin: 3 g/dL — ABNORMAL LOW (ref 3.5–5.2)
Alkaline Phosphatase: 191 U/L — ABNORMAL HIGH (ref 39–117)
BILIRUBIN TOTAL: 0.4 mg/dL (ref 0.2–1.2)
BUN: 16 mg/dL (ref 6–23)
CO2: 31 mEq/L (ref 19–32)
CREATININE: 0.75 mg/dL (ref 0.40–1.20)
Calcium: 9.3 mg/dL (ref 8.4–10.5)
Chloride: 99 mEq/L (ref 96–112)
GFR: 80.03 mL/min (ref >60.00)
GLUCOSE: 111 mg/dL — AB (ref 70–99)
Potassium: 4.5 mEq/L (ref 3.5–5.1)
SODIUM: 136 meq/L (ref 135–145)
Total Protein: 7.4 g/dL (ref 6.0–8.3)

## 2016-07-26 LAB — CBC
HEMATOCRIT: 38.5 % (ref 36.0–46.0)
HEMOGLOBIN: 13.3 g/dL (ref 12.0–15.0)
MCHC: 34.4 g/dL (ref 30.0–36.0)
MCV: 91 fl (ref 78.0–100.0)
PLATELETS: 370 10*3/uL (ref 150.0–400.0)
RBC: 4.23 Mil/uL (ref 3.87–5.11)
RDW: 16.2 % — ABNORMAL HIGH (ref 11.5–15.5)
WBC: 6.8 10*3/uL (ref 4.0–10.5)

## 2016-07-26 MED ORDER — DOXYCYCLINE HYCLATE 100 MG PO TABS: 100.0000 mg | ORAL_TABLET | Freq: Two times a day (BID) | ORAL | 3 refills | Status: DC

## 2016-07-26 NOTE — Progress Notes (Signed)
[place] at [facility] [address] [place], [place] [address] [phone]  Date:  07/26/2016   Name:  [name]   DOB:  22-Aug-1940   MRN:  [id number]  PCP:  Lamar Blinks, MD    Chief Complaint: Establish Care (Pt here to est care. )   History of Present Illness:  [name] is a 75 y.o. very pleasant female patient who presents with the following:  Complex PMHx, here today to establish care.  She ws admitted from 11/28- 12/2 with a LEFT hip fracture History of CAP s/p stents x3.  In March she had a LEFT total knee- got a MRSA infection and needed several revisions and follow-up procedures over the next several months. She also became septic due to an obstructing kidney stone in June and was admitted to [facility] in July with a bleeding colonic ulcer requiring blood transfusion . She then fell at home on 11/24- on follow-up in ortho clinic she was noted to have a left hip fracture. This was repaired operatively on 11/29 and she was discharged to [facility] on 12/2  She was seen by ID while inpt due to history of MRSA infection of right knee.  Per Dr. Megan Salon she will be on oral rifampin and doxy for at least 6 months.  She is currently taking both of these meds without difficutly They are seeing Dr. Megan Salon next month.    She was noted to have elevated glucose and an A1c was recommended per DC summary- however this was done while at [facility] and was quite normal  She was noted to drop her hg during most recent hospital likely due to operation. Transfused one unit.  Will repeat CBC today  She is back at her home.  Her grandson is living with her-  Here today with her son [name] and her DIL.   She is using her walker all the time.   They plan to have OT and PT, and a home aid. This has not been arranged as of yet but she just left [facility] on 12/22 and it has been the Christmas holiday.  They plan to have this  assistance in place soon She needs help with bathing, but she is able to get dressed on her own.  She has not driven since February.    Her goal is to get back to driving and re-gain her independence  No fever or chills noted  Her cardiologist is Dr. [name]- she did have stents in 2008 She has not noted any CP or SOB.  She cannot recall the date of her last bone density - we will schedule this for a few months from now  She developed a left heel pressure ulcer while in the hospital.  They are treating this with a a type of gel ointment.  They think it is slowly improving and have not tried to remove the large eschar from the posterior heel.  She does not have any pain at this time in her heel She also has a mild abrasion on her left knee that is healing- they have been using an abx ointment here   Lab Results  Component Value Date   WBC 7.5 06/30/2016   HGB 8.8 (L) 06/30/2016   HCT 26.7 (L) 06/30/2016   MCV 89.3 06/30/2016   PLT 300 06/30/2016     Patient Active Problem List   Diagnosis Date Noted  . Left displaced  femoral neck fracture ([place]) 06/26/2016  . MRSA (methicillin resistant Staphylococcus aureus) infection 06/26/2016  . Hypertensive heart disease   . Hydronephrosis   . Acute on chronic systolic heart failure ([place])   . Obstructive uropathy   . Hypokalemia   . Abnormal echocardiogram 01/23/2016  . Hypoxia   . Pressure ulcer 01/21/2016  . Pyonephrosis   . Hypotension 12/28/2015  . Open knee wound 11/16/2015  . Chronic infection of prosthetic knee ([place]) 10/12/2015  . Leukocytosis 09/19/2011  . Encephalopathy 09/19/2011  . AAA 01/19/2009  . ILIAC ARTERY ANEURYSM 01/19/2009  . Hyperlipidemia 01/14/2009  . TOBACCO ABUSE 01/14/2009  . Essential hypertension 01/14/2009  . Coronary atherosclerosis 01/14/2009  . Cerebrovascular disease 01/14/2009  . Peripheral vascular disease ([place]) 01/14/2009  . CHRONIC OBSTRUCTIVE PULMONARY DISEASE 01/14/2009  . CHEST  PAIN-UNSPECIFIED 01/14/2009    Past Medical History:  Diagnosis Date  . Arthritis   . CAD (coronary artery disease)    a. 02/2007 s/p PCI/DES ot LCX and RCA;  b. 07/2014 MV: no ischemia/infarct, EF 68%.  . Carotid arterial disease ([place])    a. 05/2015 Carotid U/S: RICA 4-09%, LICA 81-19%, RECA 1-47%, LECA >50% - overall stable, f/u 1 yr.  . Complication of anesthesia    Three days after procedure pt statrted to suffer with memoy loss that took about 4-5 days to come back according to [name]."  . Dilated cardiomyopathy ([place])    a. 05/2005 Echo: EF 65%; b. 01/22/2016 Echo: EF 25-30% (in setting of admission for urosepsis w/ elev trop); c. 01/26/2016 Echo: EF 35-40%, inf, distal apical, apical, and mid to distal septal HK-->felt to be 2/2 stress induced CM.  [name] Heart murmur   . History of bronchitis   . Hyperlipidemia   . Hypertension   . Hypertensive heart disease   . Memory impairment   . MRSA (methicillin resistant staph aureus) culture positive   . Nephrolithiasis    a. 12/2015 UVJ stone w/ hydroureteronephrosis req nephrostomy tube placement.  . Osteoarthritis    a. 02/2955 s/p L TKA complicated by infections req skin grafting.  [name] PVD (peripheral vascular disease) ([place])    a. Bilat common iliac dzs, nl ABI's --> med rx.  . Sepsis ([place])    a. 12/2015 admitted with Proteus mirabilis urosepsis/bacteremia.  . Wears glasses     Past Surgical History:  Procedure Laterality Date  . ABDOMINAL HYSTERECTOMY    . APPLICATION OF A-CELL OF EXTREMITY Left 12/08/2015   Procedure: APPLICATION OF A-CELL OF knee;  Surgeon: Loel Lofty [name], DO;  Location: [facility];  Service: Plastics;  Laterality: Left;  . APPLICATION OF WOUND VAC Left 11/16/2015   Procedure: APPLICATION OF WOUND VAC;  Surgeon: Loel Lofty [name], DO;  Location: [facility];  Service: Plastics;  Laterality: Left;  . APPLICATION OF WOUND VAC Left 12/08/2015   Procedure: APPLICATION OF WOUND VAC  AND CAST to knee;  Surgeon: Loel Lofty  [name], DO;  Location: [facility];  Service: Plastics;  Laterality: Left;  . COLONOSCOPY    . CORONARY ANGIOPLASTY  07   3 stents placed  . ESOPHAGOGASTRODUODENOSCOPY    . EYE SURGERY Bilateral    cataracts  . FREE FLAP TO EXTREMITY Left 11/16/2015   Procedure: FREE FLAP TO EXTREMITY;  Surgeon: Loel Lofty [name], DO;  Location: [facility];  Service: Plastics;  Laterality: Left;  . I&D EXTREMITY Left 10/18/2015   Procedure: Left Knee Washout, Reclosure;  Surgeon: Newt Minion, MD;  Location: [facility];  Service: Orthopedics;  Laterality: Left;  . I&D EXTREMITY Left 12/08/2015   Procedure: IRRIGATION AND DEBRIDEMENT knee;  Surgeon: Loel Lofty [name], DO;  Location: [facility];  Service: Plastics;  Laterality: Left;  . I&D KNEE WITH POLY EXCHANGE Left 10/12/2015   Procedure: Irrigation and Debridement , Poly Exchange, Antibiotic Beads Left Knee;  Surgeon: Newt Minion, MD;  Location: [facility];  Service: Orthopedics;  Laterality: Left;  . INCISION AND DRAINAGE OF WOUND Left 01/04/2016   Procedure: DEBRIDEMENT OF LEFT KNEE WOUND WITH ACELL AND WOUND VAC PLACEMENT;  Surgeon: Loel Lofty [name], DO;  Location: [facility];  Service: Plastics;  Laterality: Left;  . IR GENERIC HISTORICAL  05/11/2016   IR FLUORO GUIDE CV MIDLINE PICC RIGHT 05/11/2016 Sandi Mariscal, MD [facility]  . IR GENERIC HISTORICAL  05/11/2016   IR US GUIDE VASC ACCESS RIGHT 05/11/2016 Sandi Mariscal, MD [facility]  . [place]     denies  . SKIN SPLIT GRAFT Left 11/16/2015   Procedure: LEFT GASTROC MUSCLE FLAP WITH SKIN GRAFT SPLIT THICKNESS AND VAC PLACEMENT;  Surgeon: Loel Lofty [name], DO;  Location: [facility];  Service: Plastics;  Laterality: Left;  . TOTAL HIP ARTHROPLASTY Right 09/10/2012   Dr Sharol Given  . TOTAL HIP ARTHROPLASTY Right 09/10/2012   Procedure: TOTAL HIP ARTHROPLASTY;  Surgeon: Newt Minion, MD;  Location: [facility];  Service: Orthopedics;  Laterality: Right;  Right Total Hip Arthroplasty  . TOTAL HIP ARTHROPLASTY Left 06/27/2016    Procedure: LEFT TOTAL HIP ARTHROPLASTY ANTERIOR APPROACH;  Surgeon: Leandrew Koyanagi, MD;  Location: [facility];  Service: Orthopedics;  Laterality: Left;  . TOTAL KNEE ARTHROPLASTY Left 09/28/2015   Procedure: TOTAL KNEE ARTHROPLASTY;  Surgeon: Newt Minion, MD;  Location: [facility];  Service: Orthopedics;  Laterality: Left;    Social History  Substance Use Topics  . Smoking status: Former Smoker    Packs/day: 0.50    Years: 50.00    Types: Cigarettes    Quit date: 09/28/2015  . Smokeless tobacco: Former Systems developer    Quit date: 09/27/2015     Comment: quit 09/27/15  . Alcohol use No    Family History  Problem Relation Age of Onset  . Breast cancer Mother   . Leukemia Father   . Stroke Sister   . Bone cancer      No Known Allergies  Medication list has been reviewed and updated.  Current Outpatient Prescriptions on File Prior to Visit  Medication Sig Dispense Refill  . acetaminophen (TYLENOL) 325 MG tablet Take 2 tablets (650 mg total) by mouth every 6 (six) hours as needed for mild pain (or Fever >/= 101).    [name] acidophilus (RISAQUAD) CAPS capsule Take 1 capsule by mouth daily.    [name] aspirin EC 81 MG tablet Take 81 mg by mouth daily.    [name] atorvastatin (LIPITOR) 40 MG tablet Take 1 tablet (40 mg total) by mouth daily. 30 tablet 4  . budesonide-formoterol (SYMBICORT) 160-4.5 MCG/ACT inhaler Inhale 2 puffs into the lungs 2 (two) times daily.    . carvedilol (COREG) 3.125 MG tablet Take 1 tablet (3.125 mg total) by mouth 2 (two) times daily with a meal. 60 tablet 11  . colchicine 0.6 MG tablet TAKE ONE TO TWO TABLETS DAILY AS NEEDED FOR GOUT. 60 tablet 0  . doxycycline (VIBRA-TABS) 100 MG tablet Take 100 mg by mouth 2 (two) times daily with a meal.    . enoxaparin (LOVENOX) 40 MG/0.4ML injection Inject 0.4  mLs (40 mg total) into the skin daily. (Patient not taking: Reported on 07/26/2016) 14 Syringe 0  . feeding supplement, ENSURE ENLIVE, (ENSURE ENLIVE) LIQD Take 237 mLs by mouth 3 (three) times daily  between meals. (Patient not taking: Reported on 07/25/2016) 237 mL 12  . furosemide (LASIX) 20 MG tablet Take 1 tablet (20 mg total) by mouth daily. 30 tablet 6  . HYDROcodone-acetaminophen (NORCO) 7.5-325 MG tablet Take 1-2 tablets by mouth every 6 (six) hours as needed for moderate pain. (Patient not taking: Reported on 07/25/2016) 90 tablet 0  . HYDROcodone-acetaminophen (NORCO/VICODIN) 5-325 MG tablet Take 1 tablet by mouth every 4 (four) hours as needed. (Patient not taking: Reported on 07/25/2016) 6 tablet 0  . lisinopril (PRINIVIL,ZESTRIL) 5 MG tablet Take 1 tablet (5 mg total) by mouth daily. 30 tablet 12  . mesalamine (LIALDA) 1.2 g EC tablet Take 2.4 g by mouth daily with breakfast.    . methocarbamol (ROBAXIN) 500 MG tablet Take 1 tablet (500 mg total) by mouth every 6 (six) hours as needed for muscle spasms. (Patient not taking: Reported on 07/25/2016) 30 tablet 0  . Multiple Vitamin (MULITIVITAMIN WITH MINERALS) TABS Take 1 tablet by mouth daily.    . pantoprazole (PROTONIX) 40 MG tablet Take 40 mg by mouth daily.    . potassium chloride SA (K-DUR,KLOR-CON) 20 MEQ tablet Take 1 tablet (20 mEq total) by mouth daily. (Patient not taking: Reported on 07/25/2016) 30 tablet 6  . rifampin (RIFADIN) 300 MG capsule Take 2 capsules (600 mg total) by mouth daily. LONG TERM TREATMENT: TILL 11/11/2016 (Patient not taking: Reported on 07/25/2016) 60 capsule 11   No current facility-administered medications on file prior to visit.     Review of Systems:  As per HPI- otherwise negative.  Appetite is good, no nausea, vomiting or diarrhea No rash No mental status change No fever or chills, no ST or earache   Physical Examination: Vitals:   07/26/16 1043  BP: 131/71  Pulse: 87    GEN: WDWN, NAD, Non-toxic, A & O x 3, well appearing older lady.  Here today with her son and DIL HEENT: Atraumatic, Normocephalic. Neck supple. No masses, No LAD. Ears and Nose: No external deformity. CV: RRR, No  M/G/R. No JVD. No thrill. No extra heart sounds. PULM: CTA B, no wheezes, crackles, rhonchi. No retractions. No resp. distress. No accessory muscle use. EXTR: No c/c/e NEURO Normal gait for her age and condition- does use a walker, moves slowly  PSYCH: Normally interactive. Conversant. Not depressed or anxious appearing.  Calm demeanor.  Left knee: she has a small superficial skin wound over the knee that does not appear to be infected. The joint is grossly abnl and has poor range of motion due to multiple operations on same.  Left posterior heel has a firm, quarter sized eschar.  It does not appear infected, no redness    Assessment and Plan: MRSA (methicillin resistant Staphylococcus aureus) infection - Plan: doxycycline (VIBRA-TABS) 100 MG tablet  Debility  Physical deconditioning  History of hip fracture - Plan: DG BONE DENSITY (DXA)  Mild protein-calorie malnutrition (HCC) - Plan: Comprehensive metabolic panel  Acute blood loss anemia - Plan: CBC  Osteoporosis, unspecified osteoporosis type, unspecified pathological fracture presence - Plan: DG BONE DENSITY (DXA)  Decubitus ulcer of left heel, unspecified ulcer stage  Here today to establish care Recent hospital stay for a hip fracure- however over the last year she has had a lot of health issues with  MRSA infection of her left knee hardware, GI bleed and urosepsis.  She is hopefully on the road to recovery at this time. She is getting home services and recently was discharged from [facility]  Repeat CBC today Monitor CMP Recheck one month  Signed Lamar Blinks, MD

## 2016-07-26 NOTE — Progress Notes (Unsigned)
Pre visit review using our clinic review tool, if applicable. No additional management support is needed unless otherwise documented below in the visit note. 

## 2016-07-26 NOTE — Telephone Encounter (Signed)
Caller name: Willis Modena Relationship to patient: [facility] Can be reached:[phone]   Reason for call: [facility] request orders to see patient Post Leg Repair for medication and pain management. Request 1 time a week for 1 week (completed) and 2 times a week for 2 weeks. Plse adv

## 2016-07-26 NOTE — Progress Notes (Signed)
Pre visit review using our clinic review tool, if applicable. No additional management support is needed unless otherwise documented below in the visit note. 

## 2016-07-26 NOTE — Patient Instructions (Signed)
It was very nice to see you today- I will be in touch with your labs asap We will also set you up for a bone density scan in a few months  Continue the doxycycline and rifampin as directed Continue to use the hydrogel on your heel- let me know if this seems to be worsening  Please come and see me in about one month to check on your progress .

## 2016-07-27 ENCOUNTER — Other Ambulatory Visit (INDEPENDENT_AMBULATORY_CARE_PROVIDER_SITE_OTHER): Payer: Medicare Other

## 2016-07-27 DIAGNOSIS — I110 Hypertensive heart disease with heart failure: Secondary | ICD-10-CM | POA: Diagnosis not present

## 2016-07-27 DIAGNOSIS — R748 Abnormal levels of other serum enzymes: Secondary | ICD-10-CM

## 2016-07-27 DIAGNOSIS — T8454XA Infection and inflammatory reaction due to internal left knee prosthesis, initial encounter: Secondary | ICD-10-CM | POA: Diagnosis not present

## 2016-07-27 DIAGNOSIS — S72002D Fracture of unspecified part of neck of left femur, subsequent encounter for closed fracture with routine healing: Secondary | ICD-10-CM | POA: Diagnosis not present

## 2016-07-27 DIAGNOSIS — I739 Peripheral vascular disease, unspecified: Secondary | ICD-10-CM | POA: Diagnosis not present

## 2016-07-27 DIAGNOSIS — I5022 Chronic systolic (congestive) heart failure: Secondary | ICD-10-CM | POA: Diagnosis not present

## 2016-07-27 DIAGNOSIS — L89620 Pressure ulcer of left heel, unstageable: Secondary | ICD-10-CM | POA: Diagnosis not present

## 2016-07-27 LAB — GAMMA GT: GGT: 29 U/L (ref 7–51)

## 2016-07-31 DIAGNOSIS — I5022 Chronic systolic (congestive) heart failure: Secondary | ICD-10-CM | POA: Diagnosis not present

## 2016-07-31 DIAGNOSIS — I739 Peripheral vascular disease, unspecified: Secondary | ICD-10-CM | POA: Diagnosis not present

## 2016-07-31 DIAGNOSIS — I110 Hypertensive heart disease with heart failure: Secondary | ICD-10-CM | POA: Diagnosis not present

## 2016-07-31 DIAGNOSIS — S72002D Fracture of unspecified part of neck of left femur, subsequent encounter for closed fracture with routine healing: Secondary | ICD-10-CM | POA: Diagnosis not present

## 2016-07-31 DIAGNOSIS — T8454XA Infection and inflammatory reaction due to internal left knee prosthesis, initial encounter: Secondary | ICD-10-CM | POA: Diagnosis not present

## 2016-07-31 DIAGNOSIS — L89620 Pressure ulcer of left heel, unstageable: Secondary | ICD-10-CM | POA: Diagnosis not present

## 2016-07-31 NOTE — Telephone Encounter (Signed)
Called number provided and was given the following number to contact Willis Modena [phone]. Tried to contact Pam to give verbal orders but no answer. Left voicemail for a return call.

## 2016-08-01 ENCOUNTER — Telehealth (INDEPENDENT_AMBULATORY_CARE_PROVIDER_SITE_OTHER): Payer: Self-pay | Admitting: Orthopaedic Surgery

## 2016-08-01 ENCOUNTER — Telehealth: Payer: Self-pay | Admitting: Family Medicine

## 2016-08-01 NOTE — Telephone Encounter (Signed)
Please advise 

## 2016-08-01 NOTE — Telephone Encounter (Signed)
Called [place] to advise. No answer LMOM ok per Dr Erlinda Hong.

## 2016-08-01 NOTE — Telephone Encounter (Signed)
yes

## 2016-08-01 NOTE — Telephone Encounter (Signed)
Called Cindee Salt from [facility] to give approval for verbal orders for PT.

## 2016-08-01 NOTE — Telephone Encounter (Signed)
Caller name: Cindee Salt  Relation to pt: PT from [facility]   Call back number: [phone]    Reason for call:  Verbal orders for PT 2x 1 3x 3, please advise

## 2016-08-02 DIAGNOSIS — L89620 Pressure ulcer of left heel, unstageable: Secondary | ICD-10-CM | POA: Diagnosis not present

## 2016-08-02 DIAGNOSIS — T8454XA Infection and inflammatory reaction due to internal left knee prosthesis, initial encounter: Secondary | ICD-10-CM | POA: Diagnosis not present

## 2016-08-02 DIAGNOSIS — I5022 Chronic systolic (congestive) heart failure: Secondary | ICD-10-CM | POA: Diagnosis not present

## 2016-08-02 DIAGNOSIS — S72002D Fracture of unspecified part of neck of left femur, subsequent encounter for closed fracture with routine healing: Secondary | ICD-10-CM | POA: Diagnosis not present

## 2016-08-02 DIAGNOSIS — I110 Hypertensive heart disease with heart failure: Secondary | ICD-10-CM | POA: Diagnosis not present

## 2016-08-02 DIAGNOSIS — I739 Peripheral vascular disease, unspecified: Secondary | ICD-10-CM | POA: Diagnosis not present

## 2016-08-03 DIAGNOSIS — L89620 Pressure ulcer of left heel, unstageable: Secondary | ICD-10-CM | POA: Diagnosis not present

## 2016-08-03 DIAGNOSIS — I110 Hypertensive heart disease with heart failure: Secondary | ICD-10-CM | POA: Diagnosis not present

## 2016-08-03 DIAGNOSIS — I739 Peripheral vascular disease, unspecified: Secondary | ICD-10-CM | POA: Diagnosis not present

## 2016-08-03 DIAGNOSIS — S72002D Fracture of unspecified part of neck of left femur, subsequent encounter for closed fracture with routine healing: Secondary | ICD-10-CM | POA: Diagnosis not present

## 2016-08-03 DIAGNOSIS — I5022 Chronic systolic (congestive) heart failure: Secondary | ICD-10-CM | POA: Diagnosis not present

## 2016-08-03 DIAGNOSIS — T8454XA Infection and inflammatory reaction due to internal left knee prosthesis, initial encounter: Secondary | ICD-10-CM | POA: Diagnosis not present

## 2016-08-06 DIAGNOSIS — S72002D Fracture of unspecified part of neck of left femur, subsequent encounter for closed fracture with routine healing: Secondary | ICD-10-CM | POA: Diagnosis not present

## 2016-08-06 DIAGNOSIS — L89620 Pressure ulcer of left heel, unstageable: Secondary | ICD-10-CM | POA: Diagnosis not present

## 2016-08-06 DIAGNOSIS — I5022 Chronic systolic (congestive) heart failure: Secondary | ICD-10-CM | POA: Diagnosis not present

## 2016-08-06 DIAGNOSIS — I739 Peripheral vascular disease, unspecified: Secondary | ICD-10-CM | POA: Diagnosis not present

## 2016-08-06 DIAGNOSIS — T8454XA Infection and inflammatory reaction due to internal left knee prosthesis, initial encounter: Secondary | ICD-10-CM | POA: Diagnosis not present

## 2016-08-06 DIAGNOSIS — I110 Hypertensive heart disease with heart failure: Secondary | ICD-10-CM | POA: Diagnosis not present

## 2016-08-07 ENCOUNTER — Encounter (INDEPENDENT_AMBULATORY_CARE_PROVIDER_SITE_OTHER): Payer: Self-pay | Admitting: Orthopaedic Surgery

## 2016-08-07 ENCOUNTER — Ambulatory Visit (INDEPENDENT_AMBULATORY_CARE_PROVIDER_SITE_OTHER): Payer: Medicare Other | Admitting: Orthopaedic Surgery

## 2016-08-07 ENCOUNTER — Encounter: Payer: Self-pay | Admitting: Internal Medicine

## 2016-08-07 ENCOUNTER — Ambulatory Visit (INDEPENDENT_AMBULATORY_CARE_PROVIDER_SITE_OTHER): Payer: Medicare Other | Admitting: Internal Medicine

## 2016-08-07 ENCOUNTER — Other Ambulatory Visit: Payer: Medicare Other

## 2016-08-07 DIAGNOSIS — K529 Noninfective gastroenteritis and colitis, unspecified: Secondary | ICD-10-CM

## 2016-08-07 DIAGNOSIS — S72002A Fracture of unspecified part of neck of left femur, initial encounter for closed fracture: Secondary | ICD-10-CM

## 2016-08-07 DIAGNOSIS — Z96659 Presence of unspecified artificial knee joint: Secondary | ICD-10-CM

## 2016-08-07 DIAGNOSIS — T8459XD Infection and inflammatory reaction due to other internal joint prosthesis, subsequent encounter: Secondary | ICD-10-CM | POA: Diagnosis not present

## 2016-08-07 DIAGNOSIS — M1711 Unilateral primary osteoarthritis, right knee: Secondary | ICD-10-CM

## 2016-08-07 NOTE — Progress Notes (Signed)
Office Visit Note   Patient: [name]           Date of Birth: 08-11-1940           MRN: [id number] Visit Date: 08/07/2016              Requested by: Darreld Mclean, MD [address], [place] [address] PCP: Lamar Blinks, MD   Assessment & Plan: Visit Diagnoses:  1. Left displaced femoral neck fracture ([place])     Plan: From a left hip standpoint she is doing well. This is not bothering her at all. Her right knee was injected under sterile conditions. We are going to set her up with right shoulder injection with Dr. Ernestina Patches. I'll see her back in 6 weeks for follow-up of her hip fracture  Follow-Up Instructions: Return in about 6 weeks (around 09/18/2016).   Orders:  Orders Placed This Encounter  Procedures  . Ambulatory referral to Physical Medicine Rehab   No orders of the defined types were placed in this encounter.     Procedures: Large Joint Inj Date/Time: 08/07/2016 7:53 PM Performed by: Leandrew Koyanagi Authorized by: Leandrew Koyanagi   Consent Given by:  Patient Timeout: prior to procedure the correct patient, procedure, and site was verified   Indications:  Pain Location:  Knee Site:  R knee Prep: patient was prepped and draped in usual sterile fashion   Needle Size:  22 G Ultrasound Guidance: No   Fluoroscopic Guidance: No   Arthrogram: No   Patient tolerance:  Patient tolerated the procedure well with no immediate complications     Clinical Data: No additional findings.   Subjective: Chief Complaint  Patient presents with  . Left Hip - Follow-up  . Follow-up    Patient comes back for follow-up of her left hip replacement for femoral neck fracture and right knee osteoarthritis. She is doing well from the left hip standpoint and the family has noticed a huge difference. She walks in today to clinic with a walker. She is wants a right knee injection.    Review of Systems   Objective: Vital Signs: There were no vitals  taken for this visit.  Physical Exam  Ortho Exam Exam of the left hip shows a well-healed surgical scar. The seroma has resolved. Right knee exam is stable. Specialty Comments:  No specialty comments available.  Imaging: No results found.   PMFS History: Patient Active Problem List   Diagnosis Date Noted  . Chronic diarrhea 08/07/2016  . Left displaced femoral neck fracture ([place]) 06/26/2016  . MRSA (methicillin resistant Staphylococcus aureus) infection 06/26/2016  . Hypertensive heart disease   . Hydronephrosis   . Obstructive uropathy   . Abnormal echocardiogram 01/23/2016  . Pressure ulcer 01/21/2016  . Chronic infection of prosthetic knee ([place]) 10/12/2015  . AAA 01/19/2009  . ILIAC ARTERY ANEURYSM 01/19/2009  . Hyperlipidemia 01/14/2009  . TOBACCO ABUSE 01/14/2009  . Essential hypertension 01/14/2009  . Coronary atherosclerosis 01/14/2009  . Cerebrovascular disease 01/14/2009  . Peripheral vascular disease ([place]) 01/14/2009  . CHRONIC OBSTRUCTIVE PULMONARY DISEASE 01/14/2009   Past Medical History:  Diagnosis Date  . Arthritis   . CAD (coronary artery disease)    a. 02/2007 s/p PCI/DES ot LCX and RCA;  b. 07/2014 MV: no ischemia/infarct, EF 68%.  . Carotid arterial disease ([place])    a. 05/2015 Carotid U/S: RICA 8-18%, LICA 29-93%, RECA 7-16%, LECA >50% - overall stable, f/u 1 yr.  [name]  Complication of anesthesia    Three days after procedure pt statrted to suffer with memoy loss that took about 4-5 days to come back according to [name]."  . Dilated cardiomyopathy ([place])    a. 05/2005 Echo: EF 65%; b. 01/22/2016 Echo: EF 25-30% (in setting of admission for urosepsis w/ elev trop); c. 01/26/2016 Echo: EF 35-40%, inf, distal apical, apical, and mid to distal septal HK-->felt to be 2/2 stress induced CM.  [name] Heart murmur   . History of bronchitis   . Hyperlipidemia   . Hypertension   . Hypertensive heart disease   . Memory impairment   . MRSA (methicillin resistant staph  aureus) culture positive   . Nephrolithiasis    a. 12/2015 UVJ stone w/ hydroureteronephrosis req nephrostomy tube placement.  . Osteoarthritis    a. 03/1477 s/p L TKA complicated by infections req skin grafting.  [name] PVD (peripheral vascular disease) ([place])    a. Bilat common iliac dzs, nl ABI's --> med rx.  . Sepsis ([place])    a. 12/2015 admitted with Proteus mirabilis urosepsis/bacteremia.  . Wears glasses     Family History  Problem Relation Age of Onset  . Breast cancer Mother   . Leukemia Father   . Stroke Sister   . Bone cancer      Past Surgical History:  Procedure Laterality Date  . ABDOMINAL HYSTERECTOMY    . APPLICATION OF A-CELL OF EXTREMITY Left 12/08/2015   Procedure: APPLICATION OF A-CELL OF knee;  Surgeon: Loel Lofty [name], DO;  Location: [facility];  Service: Plastics;  Laterality: Left;  . APPLICATION OF WOUND VAC Left 11/16/2015   Procedure: APPLICATION OF WOUND VAC;  Surgeon: Loel Lofty [name], DO;  Location: [facility];  Service: Plastics;  Laterality: Left;  . APPLICATION OF WOUND VAC Left 12/08/2015   Procedure: APPLICATION OF WOUND VAC  AND CAST to knee;  Surgeon: Loel Lofty [name], DO;  Location: [facility];  Service: Plastics;  Laterality: Left;  . COLONOSCOPY    . CORONARY ANGIOPLASTY  07   3 stents placed  . ESOPHAGOGASTRODUODENOSCOPY    . EYE SURGERY Bilateral    cataracts  . FREE FLAP TO EXTREMITY Left 11/16/2015   Procedure: FREE FLAP TO EXTREMITY;  Surgeon: Loel Lofty [name], DO;  Location: [facility];  Service: Plastics;  Laterality: Left;  . I&D EXTREMITY Left 10/18/2015   Procedure: Left Knee Washout, Reclosure;  Surgeon: Newt Minion, MD;  Location: [facility];  Service: Orthopedics;  Laterality: Left;  . I&D EXTREMITY Left 12/08/2015   Procedure: IRRIGATION AND DEBRIDEMENT knee;  Surgeon: Loel Lofty [name], DO;  Location: [facility];  Service: Plastics;  Laterality: Left;  . I&D KNEE WITH POLY EXCHANGE Left 10/12/2015   Procedure: Irrigation and Debridement , Poly  Exchange, Antibiotic Beads Left Knee;  Surgeon: Newt Minion, MD;  Location: [facility];  Service: Orthopedics;  Laterality: Left;  . INCISION AND DRAINAGE OF WOUND Left 01/04/2016   Procedure: DEBRIDEMENT OF LEFT KNEE WOUND WITH ACELL AND WOUND VAC PLACEMENT;  Surgeon: Loel Lofty [name], DO;  Location: [facility];  Service: Plastics;  Laterality: Left;  . IR GENERIC HISTORICAL  05/11/2016   IR FLUORO GUIDE CV MIDLINE PICC RIGHT 05/11/2016 Sandi Mariscal, MD [facility]  . IR GENERIC HISTORICAL  05/11/2016   IR US GUIDE VASC ACCESS RIGHT 05/11/2016 Sandi Mariscal, MD [facility]  . [place]     denies  . SKIN SPLIT GRAFT Left 11/16/2015   Procedure: LEFT  GASTROC MUSCLE FLAP WITH SKIN GRAFT SPLIT THICKNESS AND VAC PLACEMENT;  Surgeon: Loel Lofty [name], DO;  Location: [facility];  Service: Plastics;  Laterality: Left;  . TOTAL HIP ARTHROPLASTY Right 09/10/2012   Dr Sharol Given  . TOTAL HIP ARTHROPLASTY Right 09/10/2012   Procedure: TOTAL HIP ARTHROPLASTY;  Surgeon: Newt Minion, MD;  Location: [facility];  Service: Orthopedics;  Laterality: Right;  Right Total Hip Arthroplasty  . TOTAL HIP ARTHROPLASTY Left 06/27/2016   Procedure: LEFT TOTAL HIP ARTHROPLASTY ANTERIOR APPROACH;  Surgeon: Leandrew Koyanagi, MD;  Location: [facility];  Service: Orthopedics;  Laterality: Left;  . TOTAL KNEE ARTHROPLASTY Left 09/28/2015   Procedure: TOTAL KNEE ARTHROPLASTY;  Surgeon: Newt Minion, MD;  Location: [facility];  Service: Orthopedics;  Laterality: Left;   Social History   Occupational History  . Retired    Social History Main Topics  . Smoking status: Former Smoker    Packs/day: 0.50    Years: 50.00    Types: Cigarettes    Quit date: 09/28/2015  . Smokeless tobacco: Never Used     Comment: quit 09/27/15  . Alcohol use No  . Drug use: No  . Sexual activity: No

## 2016-08-07 NOTE — Progress Notes (Signed)
[facility] for Infectious Disease  Patient Active Problem List   Diagnosis Date Noted  . MRSA (methicillin resistant Staphylococcus aureus) infection 06/26/2016    Priority: High  . Chronic infection of prosthetic knee ([place]) 10/12/2015    Priority: High  . Chronic diarrhea 08/07/2016  . Left displaced femoral neck fracture ([place]) 06/26/2016  . Hypertensive heart disease   . Hydronephrosis   . Obstructive uropathy   . Abnormal echocardiogram 01/23/2016  . Pressure ulcer 01/21/2016  . AAA 01/19/2009  . ILIAC ARTERY ANEURYSM 01/19/2009  . Hyperlipidemia 01/14/2009  . TOBACCO ABUSE 01/14/2009  . Essential hypertension 01/14/2009  . Coronary atherosclerosis 01/14/2009  . Cerebrovascular disease 01/14/2009  . Peripheral vascular disease ([place]) 01/14/2009  . CHRONIC OBSTRUCTIVE PULMONARY DISEASE 01/14/2009    Patient's Medications  New Prescriptions   No medications on file  Previous Medications   ACETAMINOPHEN (TYLENOL) 325 MG TABLET    Take 2 tablets (650 mg total) by mouth every 6 (six) hours as needed for mild pain (or Fever >/= 101).   ACIDOPHILUS (RISAQUAD) CAPS CAPSULE    Take 1 capsule by mouth daily.   ASPIRIN EC 81 MG TABLET    Take 81 mg by mouth daily.   ATORVASTATIN (LIPITOR) 40 MG TABLET    Take 1 tablet (40 mg total) by mouth daily.   BUDESONIDE-FORMOTEROL (SYMBICORT) 160-4.5 MCG/ACT INHALER    Inhale 2 puffs into the lungs 2 (two) times daily.   CARVEDILOL (COREG) 3.125 MG TABLET    Take 1 tablet (3.125 mg total) by mouth 2 (two) times daily with a meal.   COLCHICINE 0.6 MG TABLET    TAKE ONE TO TWO TABLETS DAILY AS NEEDED FOR GOUT.   DOXYCYCLINE (VIBRA-TABS) 100 MG TABLET    Take 1 tablet (100 mg total) by mouth 2 (two) times daily with a meal.   FEEDING SUPPLEMENT, ENSURE ENLIVE, (ENSURE ENLIVE) LIQD    Take 237 mLs by mouth 3 (three) times daily between meals.   FUROSEMIDE (LASIX) 20 MG TABLET    Take 1 tablet (20 mg total) by mouth daily.   LISINOPRIL (PRINIVIL,ZESTRIL) 5 MG TABLET    Take 1 tablet (5 mg total) by mouth daily.   MESALAMINE (LIALDA) 1.2 G EC TABLET    Take 2.4 g by mouth daily with breakfast.   METHOCARBAMOL (ROBAXIN) 500 MG TABLET    Take 1 tablet (500 mg total) by mouth every 6 (six) hours as needed for muscle spasms.   MULTIPLE VITAMIN (MULITIVITAMIN WITH MINERALS) TABS    Take 1 tablet by mouth daily.   PANTOPRAZOLE (PROTONIX) 40 MG TABLET    Take 40 mg by mouth daily.   POTASSIUM CHLORIDE SA (K-DUR,KLOR-CON) 20 MEQ TABLET    Take 1 tablet (20 mEq total) by mouth daily.   RIFAMPIN (RIFADIN) 300 MG CAPSULE    Take 2 capsules (600 mg total) by mouth daily. LONG TERM TREATMENT: TILL 11/11/2016  Modified Medications   No medications on file  Discontinued Medications   No medications on file    Subjective: [name] In with her son and daughter for her routine follow-up visit. She underwent left total knee arthroplasty on 09/28/2015. Very soon after her surgery she developed signs of acute infection and underwent incision and drainage with poly-exchange on 10/12/2015. Operative cultures grew MRSA. She was discharged before culture results were available on vancomycin and levofloxacin. She underwent a second incision and drainage procedure for wound dehiscence on  10/18/2015. She required a lateral gastrocnemius muscle flap repair on 11/16/2015. She followed up with my partner in clinic in April and was switched to oral doxycycline with the intention of continuing that for up to 6 months total. She was in and out of several skilled nursing facilities at this time. She had 2 more procedures done on her knee in May and then again in June for skin grafting. She was readmitted to the hospital on 01/20/2016 with sepsis and Proteus bacteremia from a urinary source. Her admission notes indicates that she was on doxycycline at that time. However doxycycline was stopped during that hospitalization and it was not continued upon  discharge. She missed her follow-up visit here in clinic. In early October she developed sudden onset of severe pain and redness in her left knee. She was seen back in the office by Dr. Sharol Given who noted a small area of cellulitis approximate 2 cm in diameter over the patellar tendon. He incised and drained a small abscess which has grown MRSA again. A PICC line was placed and she was started back on IV vancomycin and rifampin was added.    She fell when trying to get out of a chair on 06/22/2016. She believes she fell right on to her left kneeand suffered a left femoral neck fracture. She was hospitalized and underwent total hip arthroplasty. After surgery her PICC was removed and her vancomycin was stopped. She has been on oral doxycycline and rifampin since that time. She has had no problems tolerating her antibiotics. She has had no fever, chills or sweats. Her appetite is very good.  Review of Systems: Review of Systems  Constitutional: Negative for chills, diaphoresis, fever, malaise/fatigue and weight loss.  HENT: Negative for sore throat.   Respiratory: Negative for cough, sputum production and shortness of breath.   Cardiovascular: Negative for chest pain.  Gastrointestinal: Positive for diarrhea. Negative for abdominal pain, heartburn, nausea and vomiting.  Musculoskeletal: Positive for joint pain. Negative for myalgias.  Skin: Negative for rash.  Neurological: Negative for dizziness and headaches.    Past Medical History:  Diagnosis Date  . Arthritis   . CAD (coronary artery disease)    a. 02/2007 s/p PCI/DES ot LCX and RCA;  b. 07/2014 MV: no ischemia/infarct, EF 68%.  . Carotid arterial disease ([place])    a. 05/2015 Carotid U/S: RICA 1-32%, LICA 44-01%, RECA 0-27%, LECA >50% - overall stable, f/u 1 yr.  . Complication of anesthesia    Three days after procedure pt statrted to suffer with memoy loss that took about 4-5 days to come back according to pt son [name]."  . Dilated  cardiomyopathy ([place])    a. 05/2005 Echo: EF 65%; b. 01/22/2016 Echo: EF 25-30% (in setting of admission for urosepsis w/ elev trop); c. 01/26/2016 Echo: EF 35-40%, inf, distal apical, apical, and mid to distal septal HK-->felt to be 2/2 stress induced CM.  [name] Heart murmur   . History of bronchitis   . Hyperlipidemia   . Hypertension   . Hypertensive heart disease   . Memory impairment   . MRSA (methicillin resistant staph aureus) culture positive   . Nephrolithiasis    a. 12/2015 UVJ stone w/ hydroureteronephrosis req nephrostomy tube placement.  . Osteoarthritis    a. 08/5364 s/p L TKA complicated by infections req skin grafting.  [name] PVD (peripheral vascular disease) ([place])    a. Bilat common iliac dzs, nl ABI's --> med rx.  . Sepsis ([place])    a.  12/2015 admitted with Proteus mirabilis urosepsis/bacteremia.  . Wears glasses     Social History  Substance Use Topics  . Smoking status: Former Smoker    Packs/day: 0.50    Years: 50.00    Types: Cigarettes    Quit date: 09/28/2015  . Smokeless tobacco: Never Used     Comment: quit 09/27/15  . Alcohol use No    Family History  Problem Relation Age of Onset  . Breast cancer Mother   . Leukemia Father   . Stroke Sister   . Bone cancer      No Known Allergies  Objective: Vitals:   08/07/16 1044  BP: (!) 168/84  Pulse: 88  Weight: 107 lb (48.5 kg)  Height: '5\' 2"'$  (1.575 m)   Body mass index is 19.57 kg/m.  Physical Exam  Constitutional: She is oriented to person, place, and time.  She is in good spirits.  Cardiovascular: Normal rate and regular rhythm.   No murmur heard. Pulmonary/Chest: Effort normal and breath sounds normal.  Abdominal: Soft. There is no tenderness.  Musculoskeletal:  She has a chronic scabbed area in the midportion of her left knee incision. There is no drainage, fluctuance or surrounding cellulitis. He has good range of motion without significant swelling of her knee.  Neurological: She is alert and  oriented to person, place, and time.  Skin: No rash noted.  Psychiatric: Mood and affect normal.    Lab Results    Problem List Items Addressed This Visit      High   Chronic infection of prosthetic knee ([place])    She had a clinical relapse of her left prosthetic knee infection after her doxycycline was stopped in June. I talked to her and her children about management options and we all agree that it is best to continue doxycycline and rifampin for now. There is a potential interaction of rifampin with her Lipitor and Coreg. Rifampin is likely to increase metabolism of both so she will need to be monitored to see if the doses of her Lipitor and Coreg need to be adjusted upward. She will follow-up here in 2 months.        Unprioritized   Chronic diarrhea    She takes Lialda for colitis. She has noted increased frequency of loose stools over the past several months. I will check her for C. difficile colitis.      Relevant Orders   Clostridium Difficile by PCR       Michel Bickers, MD [facility] for [facility] [phone] pager   [phone] cell 08/07/2016, 11:57 AM

## 2016-08-07 NOTE — Assessment & Plan Note (Signed)
She had a clinical relapse of her left prosthetic knee infection after her doxycycline was stopped in June. I talked to her and her children about management options and we all agree that it is best to continue doxycycline and rifampin for now. There is a potential interaction of rifampin with her Lipitor and Coreg. Rifampin is likely to increase metabolism of both so she will need to be monitored to see if the doses of her Lipitor and Coreg need to be adjusted upward. She will follow-up here in 2 months.

## 2016-08-07 NOTE — Assessment & Plan Note (Signed)
She takes Lialda for colitis. She has noted increased frequency of loose stools over the past several months. I will check her for C. difficile colitis.

## 2016-08-08 DIAGNOSIS — I5022 Chronic systolic (congestive) heart failure: Secondary | ICD-10-CM | POA: Diagnosis not present

## 2016-08-08 DIAGNOSIS — S72002D Fracture of unspecified part of neck of left femur, subsequent encounter for closed fracture with routine healing: Secondary | ICD-10-CM | POA: Diagnosis not present

## 2016-08-08 DIAGNOSIS — L89620 Pressure ulcer of left heel, unstageable: Secondary | ICD-10-CM | POA: Diagnosis not present

## 2016-08-08 DIAGNOSIS — I739 Peripheral vascular disease, unspecified: Secondary | ICD-10-CM | POA: Diagnosis not present

## 2016-08-08 DIAGNOSIS — I110 Hypertensive heart disease with heart failure: Secondary | ICD-10-CM | POA: Diagnosis not present

## 2016-08-08 DIAGNOSIS — T8454XA Infection and inflammatory reaction due to internal left knee prosthesis, initial encounter: Secondary | ICD-10-CM | POA: Diagnosis not present

## 2016-08-08 LAB — CLOSTRIDIUM DIFFICILE BY PCR: CDIFFPCR: NOT DETECTED

## 2016-08-10 DIAGNOSIS — I5022 Chronic systolic (congestive) heart failure: Secondary | ICD-10-CM | POA: Diagnosis not present

## 2016-08-10 DIAGNOSIS — I110 Hypertensive heart disease with heart failure: Secondary | ICD-10-CM | POA: Diagnosis not present

## 2016-08-10 DIAGNOSIS — T8454XA Infection and inflammatory reaction due to internal left knee prosthesis, initial encounter: Secondary | ICD-10-CM | POA: Diagnosis not present

## 2016-08-10 DIAGNOSIS — L89620 Pressure ulcer of left heel, unstageable: Secondary | ICD-10-CM | POA: Diagnosis not present

## 2016-08-10 DIAGNOSIS — I739 Peripheral vascular disease, unspecified: Secondary | ICD-10-CM | POA: Diagnosis not present

## 2016-08-10 DIAGNOSIS — S72002D Fracture of unspecified part of neck of left femur, subsequent encounter for closed fracture with routine healing: Secondary | ICD-10-CM | POA: Diagnosis not present

## 2016-08-13 DIAGNOSIS — T8454XA Infection and inflammatory reaction due to internal left knee prosthesis, initial encounter: Secondary | ICD-10-CM | POA: Diagnosis not present

## 2016-08-13 DIAGNOSIS — I739 Peripheral vascular disease, unspecified: Secondary | ICD-10-CM | POA: Diagnosis not present

## 2016-08-13 DIAGNOSIS — S72002D Fracture of unspecified part of neck of left femur, subsequent encounter for closed fracture with routine healing: Secondary | ICD-10-CM | POA: Diagnosis not present

## 2016-08-13 DIAGNOSIS — I110 Hypertensive heart disease with heart failure: Secondary | ICD-10-CM | POA: Diagnosis not present

## 2016-08-13 DIAGNOSIS — I5022 Chronic systolic (congestive) heart failure: Secondary | ICD-10-CM | POA: Diagnosis not present

## 2016-08-13 DIAGNOSIS — L89620 Pressure ulcer of left heel, unstageable: Secondary | ICD-10-CM | POA: Diagnosis not present

## 2016-08-14 ENCOUNTER — Encounter (INDEPENDENT_AMBULATORY_CARE_PROVIDER_SITE_OTHER): Payer: Self-pay | Admitting: Physical Medicine and Rehabilitation

## 2016-08-14 ENCOUNTER — Ambulatory Visit (INDEPENDENT_AMBULATORY_CARE_PROVIDER_SITE_OTHER): Payer: Medicare Other | Admitting: Physical Medicine and Rehabilitation

## 2016-08-14 VITALS — BP 152/85 | HR 89

## 2016-08-14 DIAGNOSIS — G8929 Other chronic pain: Secondary | ICD-10-CM | POA: Diagnosis not present

## 2016-08-14 DIAGNOSIS — L89620 Pressure ulcer of left heel, unstageable: Secondary | ICD-10-CM | POA: Diagnosis not present

## 2016-08-14 DIAGNOSIS — T8454XA Infection and inflammatory reaction due to internal left knee prosthesis, initial encounter: Secondary | ICD-10-CM | POA: Diagnosis not present

## 2016-08-14 DIAGNOSIS — I5022 Chronic systolic (congestive) heart failure: Secondary | ICD-10-CM | POA: Diagnosis not present

## 2016-08-14 DIAGNOSIS — M25511 Pain in right shoulder: Secondary | ICD-10-CM | POA: Diagnosis not present

## 2016-08-14 DIAGNOSIS — I110 Hypertensive heart disease with heart failure: Secondary | ICD-10-CM | POA: Diagnosis not present

## 2016-08-14 DIAGNOSIS — I739 Peripheral vascular disease, unspecified: Secondary | ICD-10-CM | POA: Diagnosis not present

## 2016-08-14 DIAGNOSIS — S72002D Fracture of unspecified part of neck of left femur, subsequent encounter for closed fracture with routine healing: Secondary | ICD-10-CM | POA: Diagnosis not present

## 2016-08-14 NOTE — Progress Notes (Signed)
[name] - 76 y.o. female MRN [id number]  Date of birth: 25-Mar-1941  Office Visit Note: Visit Date: 08/14/2016 PCP: Lamar Blinks, MD Referred by: Darreld Mclean, MD  Subjective: Chief Complaint  Patient presents with  . Right Shoulder - Pain   HPI: Mrs. Wienke is a 76 year old female who is followed by Dr. Erlinda Hong for various orthopedic complaints. Right shoulder pain for several months. Not radiating. Worse with reaching. She does have degenerative changes of the glenohumeral joint. She has limited range of motion with extension of the shoulder abduction. She has pain anteriorly. She has no radicular pattern.    ROS Otherwise per HPI.  Assessment & Plan: Visit Diagnoses:  1. Chronic right shoulder pain     Plan: Findings:  Right shoulder anesthetic arthrogram. She'll follow-up with Dr. Erlinda Hong.    Meds & Orders: No orders of the defined types were placed in this encounter.   Orders Placed This Encounter  Procedures  . Large Joint Injection/Arthrocentesis    Follow-up: Return for scheduled follow up with Dr. Erlinda Hong.   Procedures: Glenohumeral joint anesthetic arthrogram Date/Time: 08/14/2016 2:47 PM Performed by: Magnus Sinning Authorized by: Magnus Sinning   Consent Given by:  Patient Site marked: the procedure site was marked   Timeout: prior to procedure the correct patient, procedure, and site was verified   Indications:  Pain and diagnostic evaluation Location:  Shoulder Site:  R glenohumeral Prep: patient was prepped and draped in usual sterile fashion   Needle Size:  22 G Needle Length:  3.5 inches Approach:  Anteromedial Ultrasound Guidance: No   Fluoroscopic Guidance: No   Arthrogram: Yes   Medications:  4 mL lidocaine 2 %; 80 mg triamcinolone acetonide 40 MG/ML Aspiration Attempted: No   Patient tolerance:  Patient tolerated the procedure well with no immediate complications  Arthrogram demonstrated excellent flow of contrast throughout the  joint surface with some extravasation above the rotator cuff likely representing a tear.  The patient had relief of symptoms during the anesthetic phase of the injection.      No notes on file   Clinical History: No specialty comments available.  She reports that she quit smoking about 10 months ago. Her smoking use included Cigarettes. She has a 25.00 pack-year smoking history. She has never used smokeless tobacco.   Recent Labs  07/10/16  HGBA1C 4.9    Objective:  VS:  HT:    WT:   BMI:     BP:(!) 152/85  HR:89bpm  TEMP: ( )  RESP:  Physical Exam  Musculoskeletal:  Patient ambulates with a walker. Right shoulder shows no tenderness along the biceps tendon she does have pain with abduction and impingement signs. She does have decreased range of motion.    Ortho Exam Imaging: No results found.  Past Medical/Family/Surgical/Social History: Medications & Allergies reviewed per EMR Patient Active Problem List   Diagnosis Date Noted  . Chronic diarrhea 08/07/2016  . Left displaced femoral neck fracture ([place]) 06/26/2016  . MRSA (methicillin resistant Staphylococcus aureus) infection 06/26/2016  . Hypertensive heart disease   . Hydronephrosis   . Obstructive uropathy   . Abnormal echocardiogram 01/23/2016  . Pressure ulcer 01/21/2016  . Chronic infection of prosthetic knee ([place]) 10/12/2015  . AAA 01/19/2009  . ILIAC ARTERY ANEURYSM 01/19/2009  . Hyperlipidemia 01/14/2009  . TOBACCO ABUSE 01/14/2009  . Essential hypertension 01/14/2009  . Coronary atherosclerosis 01/14/2009  . Cerebrovascular disease 01/14/2009  . Peripheral vascular disease ([place]) 01/14/2009  . CHRONIC OBSTRUCTIVE  PULMONARY DISEASE 01/14/2009   Past Medical History:  Diagnosis Date  . Arthritis   . CAD (coronary artery disease)    a. 02/2007 s/p PCI/DES ot LCX and RCA;  b. 07/2014 MV: no ischemia/infarct, EF 68%.  . Carotid arterial disease ([place])    a. 05/2015 Carotid U/S: RICA 5-85%, LICA 27-78%,  RECA 2-42%, LECA >50% - overall stable, f/u 1 yr.  . Complication of anesthesia    Three days after procedure pt statrted to suffer with memoy loss that took about 4-5 days to come back according to [name]."  . Dilated cardiomyopathy ([place])    a. 05/2005 Echo: EF 65%; b. 01/22/2016 Echo: EF 25-30% (in setting of admission for urosepsis w/ elev trop); c. 01/26/2016 Echo: EF 35-40%, inf, distal apical, apical, and mid to distal septal HK-->felt to be 2/2 stress induced CM.  [name] Heart murmur   . History of bronchitis   . Hyperlipidemia   . Hypertension   . Hypertensive heart disease   . Memory impairment   . MRSA (methicillin resistant staph aureus) culture positive   . Nephrolithiasis    a. 12/2015 UVJ stone w/ hydroureteronephrosis req nephrostomy tube placement.  . Osteoarthritis    a. 09/5359 s/p L TKA complicated by infections req skin grafting.  [name] PVD (peripheral vascular disease) ([place])    a. Bilat common iliac dzs, nl ABI's --> med rx.  . Sepsis ([place])    a. 12/2015 admitted with Proteus mirabilis urosepsis/bacteremia.  . Wears glasses    Family History  Problem Relation Age of Onset  . Breast cancer Mother   . Leukemia Father   . Stroke Sister   . Bone cancer     Past Surgical History:  Procedure Laterality Date  . ABDOMINAL HYSTERECTOMY    . APPLICATION OF A-CELL OF EXTREMITY Left 12/08/2015   Procedure: APPLICATION OF A-CELL OF knee;  Surgeon: Loel Lofty [name], DO;  Location: [place];  Service: Plastics;  Laterality: Left;  . APPLICATION OF WOUND VAC Left 11/16/2015   Procedure: APPLICATION OF WOUND VAC;  Surgeon: Loel Lofty [name], DO;  Location: [place];  Service: Plastics;  Laterality: Left;  . APPLICATION OF WOUND VAC Left 12/08/2015   Procedure: APPLICATION OF WOUND VAC  AND CAST to knee;  Surgeon: Loel Lofty [name], DO;  Location: [place];  Service: Plastics;  Laterality: Left;  . COLONOSCOPY    . CORONARY ANGIOPLASTY  07   3 stents placed  . ESOPHAGOGASTRODUODENOSCOPY     . EYE SURGERY Bilateral    cataracts  . FREE FLAP TO EXTREMITY Left 11/16/2015   Procedure: FREE FLAP TO EXTREMITY;  Surgeon: Loel Lofty [name], DO;  Location: [facility];  Service: Plastics;  Laterality: Left;  . I&D EXTREMITY Left 10/18/2015   Procedure: Left Knee Washout, Reclosure;  Surgeon: Newt Minion, MD;  Location: [facility];  Service: Orthopedics;  Laterality: Left;  . I&D EXTREMITY Left 12/08/2015   Procedure: IRRIGATION AND DEBRIDEMENT knee;  Surgeon: Loel Lofty [name], DO;  Location: [facility];  Service: Plastics;  Laterality: Left;  . I&D KNEE WITH POLY EXCHANGE Left 10/12/2015   Procedure: Irrigation and Debridement , Poly Exchange, Antibiotic Beads Left Knee;  Surgeon: Newt Minion, MD;  Location: [place];  Service: Orthopedics;  Laterality: Left;  . INCISION AND DRAINAGE OF WOUND Left 01/04/2016   Procedure: DEBRIDEMENT OF LEFT KNEE WOUND WITH ACELL AND WOUND VAC PLACEMENT;  Surgeon: Loel Lofty [name], DO;  Location: [facility];  Service:  Plastics;  Laterality: Left;  . IR GENERIC HISTORICAL  05/11/2016   IR FLUORO GUIDE CV MIDLINE PICC RIGHT 05/11/2016 Sandi Mariscal, MD [facility]  . IR GENERIC HISTORICAL  05/11/2016   IR US GUIDE VASC ACCESS RIGHT 05/11/2016 Sandi Mariscal, MD [facility]  . [place]     denies  . SKIN SPLIT GRAFT Left 11/16/2015   Procedure: LEFT GASTROC MUSCLE FLAP WITH SKIN GRAFT SPLIT THICKNESS AND VAC PLACEMENT;  Surgeon: Loel Lofty [name], DO;  Location: [facility];  Service: Plastics;  Laterality: Left;  . TOTAL HIP ARTHROPLASTY Right 09/10/2012   Dr Sharol Given  . TOTAL HIP ARTHROPLASTY Right 09/10/2012   Procedure: TOTAL HIP ARTHROPLASTY;  Surgeon: Newt Minion, MD;  Location: [facility];  Service: Orthopedics;  Laterality: Right;  Right Total Hip Arthroplasty  . TOTAL HIP ARTHROPLASTY Left 06/27/2016   Procedure: LEFT TOTAL HIP ARTHROPLASTY ANTERIOR APPROACH;  Surgeon: Leandrew Koyanagi, MD;  Location: [facility];  Service: Orthopedics;  Laterality: Left;  . TOTAL KNEE ARTHROPLASTY  Left 09/28/2015   Procedure: TOTAL KNEE ARTHROPLASTY;  Surgeon: Newt Minion, MD;  Location: [facility];  Service: Orthopedics;  Laterality: Left;   Social History   Occupational History  . Retired    Social History Main Topics  . Smoking status: Former Smoker    Packs/day: 0.50    Years: 50.00    Types: Cigarettes    Quit date: 09/28/2015  . Smokeless tobacco: Never Used     Comment: quit 09/27/15  . Alcohol use No  . Drug use: No  . Sexual activity: No

## 2016-08-14 NOTE — Patient Instructions (Signed)

## 2016-08-15 MED ORDER — TRIAMCINOLONE ACETONIDE 40 MG/ML IJ SUSP
80.0000 mg | INTRAMUSCULAR | Status: AC | PRN
  Administered 2016-08-14: 80 mg via INTRA_ARTICULAR

## 2016-08-15 MED ORDER — LIDOCAINE HCL 2 % IJ SOLN
4.0000 mL | INTRAMUSCULAR | Status: AC | PRN
  Administered 2016-08-14: 4 mL

## 2016-08-17 DIAGNOSIS — L89620 Pressure ulcer of left heel, unstageable: Secondary | ICD-10-CM | POA: Diagnosis not present

## 2016-08-17 DIAGNOSIS — I110 Hypertensive heart disease with heart failure: Secondary | ICD-10-CM | POA: Diagnosis not present

## 2016-08-17 DIAGNOSIS — S72002D Fracture of unspecified part of neck of left femur, subsequent encounter for closed fracture with routine healing: Secondary | ICD-10-CM | POA: Diagnosis not present

## 2016-08-17 DIAGNOSIS — I5022 Chronic systolic (congestive) heart failure: Secondary | ICD-10-CM | POA: Diagnosis not present

## 2016-08-17 DIAGNOSIS — I739 Peripheral vascular disease, unspecified: Secondary | ICD-10-CM | POA: Diagnosis not present

## 2016-08-17 DIAGNOSIS — T8454XA Infection and inflammatory reaction due to internal left knee prosthesis, initial encounter: Secondary | ICD-10-CM | POA: Diagnosis not present

## 2016-08-20 DIAGNOSIS — L89620 Pressure ulcer of left heel, unstageable: Secondary | ICD-10-CM | POA: Diagnosis not present

## 2016-08-20 DIAGNOSIS — I110 Hypertensive heart disease with heart failure: Secondary | ICD-10-CM | POA: Diagnosis not present

## 2016-08-20 DIAGNOSIS — I739 Peripheral vascular disease, unspecified: Secondary | ICD-10-CM | POA: Diagnosis not present

## 2016-08-20 DIAGNOSIS — T8454XA Infection and inflammatory reaction due to internal left knee prosthesis, initial encounter: Secondary | ICD-10-CM | POA: Diagnosis not present

## 2016-08-20 DIAGNOSIS — I5022 Chronic systolic (congestive) heart failure: Secondary | ICD-10-CM | POA: Diagnosis not present

## 2016-08-20 DIAGNOSIS — S72002D Fracture of unspecified part of neck of left femur, subsequent encounter for closed fracture with routine healing: Secondary | ICD-10-CM | POA: Diagnosis not present

## 2016-08-22 DIAGNOSIS — I110 Hypertensive heart disease with heart failure: Secondary | ICD-10-CM | POA: Diagnosis not present

## 2016-08-22 DIAGNOSIS — L89620 Pressure ulcer of left heel, unstageable: Secondary | ICD-10-CM | POA: Diagnosis not present

## 2016-08-22 DIAGNOSIS — S72002D Fracture of unspecified part of neck of left femur, subsequent encounter for closed fracture with routine healing: Secondary | ICD-10-CM | POA: Diagnosis not present

## 2016-08-22 DIAGNOSIS — T8454XA Infection and inflammatory reaction due to internal left knee prosthesis, initial encounter: Secondary | ICD-10-CM | POA: Diagnosis not present

## 2016-08-22 DIAGNOSIS — I5022 Chronic systolic (congestive) heart failure: Secondary | ICD-10-CM | POA: Diagnosis not present

## 2016-08-22 DIAGNOSIS — I739 Peripheral vascular disease, unspecified: Secondary | ICD-10-CM | POA: Diagnosis not present

## 2016-08-23 DIAGNOSIS — T8454XA Infection and inflammatory reaction due to internal left knee prosthesis, initial encounter: Secondary | ICD-10-CM | POA: Diagnosis not present

## 2016-08-23 DIAGNOSIS — L89620 Pressure ulcer of left heel, unstageable: Secondary | ICD-10-CM | POA: Diagnosis not present

## 2016-08-23 DIAGNOSIS — I739 Peripheral vascular disease, unspecified: Secondary | ICD-10-CM | POA: Diagnosis not present

## 2016-08-23 DIAGNOSIS — I5022 Chronic systolic (congestive) heart failure: Secondary | ICD-10-CM | POA: Diagnosis not present

## 2016-08-23 DIAGNOSIS — I110 Hypertensive heart disease with heart failure: Secondary | ICD-10-CM | POA: Diagnosis not present

## 2016-08-23 DIAGNOSIS — S72002D Fracture of unspecified part of neck of left femur, subsequent encounter for closed fracture with routine healing: Secondary | ICD-10-CM | POA: Diagnosis not present

## 2016-08-27 ENCOUNTER — Encounter (INDEPENDENT_AMBULATORY_CARE_PROVIDER_SITE_OTHER): Payer: Self-pay | Admitting: Orthopaedic Surgery

## 2016-08-27 ENCOUNTER — Ambulatory Visit (INDEPENDENT_AMBULATORY_CARE_PROVIDER_SITE_OTHER): Payer: Medicare Other | Admitting: Orthopaedic Surgery

## 2016-08-27 ENCOUNTER — Ambulatory Visit (INDEPENDENT_AMBULATORY_CARE_PROVIDER_SITE_OTHER): Payer: Medicare Other | Admitting: Family Medicine

## 2016-08-27 ENCOUNTER — Telehealth: Payer: Self-pay | Admitting: Family Medicine

## 2016-08-27 VITALS — BP 122/84 | HR 94 | Temp 97.5°F | Ht 62.0 in | Wt 111.4 lb

## 2016-08-27 DIAGNOSIS — M25562 Pain in left knee: Secondary | ICD-10-CM

## 2016-08-27 DIAGNOSIS — G8929 Other chronic pain: Secondary | ICD-10-CM | POA: Diagnosis not present

## 2016-08-27 DIAGNOSIS — Z8619 Personal history of other infectious and parasitic diseases: Secondary | ICD-10-CM

## 2016-08-27 DIAGNOSIS — Z5181 Encounter for therapeutic drug level monitoring: Secondary | ICD-10-CM | POA: Diagnosis not present

## 2016-08-27 LAB — CBC WITH DIFFERENTIAL/PLATELET
BASOS ABS: 0 10*3/uL (ref 0.0–0.1)
Basophils Relative: 0.8 % (ref 0.0–3.0)
EOS ABS: 0.1 10*3/uL (ref 0.0–0.7)
Eosinophils Relative: 1.8 % (ref 0.0–5.0)
HCT: 38.7 % (ref 36.0–46.0)
Hemoglobin: 13.5 g/dL (ref 12.0–15.0)
LYMPHS ABS: 1.5 10*3/uL (ref 0.7–4.0)
LYMPHS PCT: 24.3 % (ref 12.0–46.0)
MCHC: 34.8 g/dL (ref 30.0–36.0)
MCV: 95.8 fl (ref 78.0–100.0)
MONOS PCT: 7.9 % (ref 3.0–12.0)
Monocytes Absolute: 0.5 10*3/uL (ref 0.1–1.0)
NEUTROS ABS: 4 10*3/uL (ref 1.4–7.7)
NEUTROS PCT: 65.2 % (ref 43.0–77.0)
PLATELETS: 297 10*3/uL (ref 150.0–400.0)
RBC: 4.04 Mil/uL (ref 3.87–5.11)
RDW: 16 % — ABNORMAL HIGH (ref 11.5–15.5)
WBC: 6.2 10*3/uL (ref 4.0–10.5)

## 2016-08-27 LAB — COMPREHENSIVE METABOLIC PANEL
ALK PHOS: 155 U/L — AB (ref 39–117)
ALT: 17 U/L (ref 0–35)
AST: 22 U/L (ref 0–37)
Albumin: 3.5 g/dL (ref 3.5–5.2)
BILIRUBIN TOTAL: 0.4 mg/dL (ref 0.2–1.2)
BUN: 20 mg/dL (ref 6–23)
CO2: 27 meq/L (ref 19–32)
CREATININE: 1.04 mg/dL (ref 0.40–1.20)
Calcium: 9.9 mg/dL (ref 8.4–10.5)
Chloride: 100 mEq/L (ref 96–112)
GFR: 54.87 mL/min — ABNORMAL LOW (ref >60.00)
GLUCOSE: 100 mg/dL — AB (ref 70–99)
Potassium: 4.3 mEq/L (ref 3.5–5.1)
Sodium: 135 mEq/L (ref 135–145)
TOTAL PROTEIN: 7.3 g/dL (ref 6.0–8.3)

## 2016-08-27 MED ORDER — TRAMADOL HCL 50 MG PO TABS: 50.0000 mg | ORAL_TABLET | Freq: Four times a day (QID) | ORAL | 0 refills | Status: DC | PRN

## 2016-08-27 NOTE — Patient Instructions (Addendum)
We are going to get basic labs for you today- including a blood culture to look for any sign of infection in your blood. I will be in touch with these results asap.  We will also make sure that your kidneys, liver, and blood count look ok  You will see Dr. Erlinda Hong this afternoon at [facility] [address] [place], [place] [address] Phone: [phone]

## 2016-08-27 NOTE — Telephone Encounter (Signed)
Called back and left a detailed message on [name]'s phone with verbal order for PT as noted in previous note.  Verdis Frederickson was encouraged to call back if needed.

## 2016-08-27 NOTE — Telephone Encounter (Signed)
Caller name: Verdis Frederickson  Relation to pt: PT from [facility]  Call back number: [phone]    Reason for call:  Verbal orders to extend PT 2x 3 beginning this week, please advise

## 2016-08-27 NOTE — Progress Notes (Signed)
[place] at [facility] [address], [place], [place] [address] [phone]  Date:  08/27/2016   Name:  [name]   DOB:  Jun 16, 1941   MRN:  [id number]  PCP:  Lamar Blinks, MD    Chief Complaint: Follow-up (Pt here for f/u visit. )   History of Present Illness:  [name] is a 76 y.o. very pleasant female patient who presents with the following:  Last visit on 07-26-2016, was to establish care.  HPI from this visit:  Complex PMHx, here today to establish care.  She ws admitted from 11/28- 12/2 with a LEFT hip fracture History of CAP s/p stents x3.  In March she had a LEFT total knee- got a MRSA infection and needed several revisions and follow-up procedures over the next several months. She also became septic due to an obstructing kidney stone in June and was admitted to [facility] in July with a bleeding colonic ulcer requiring blood transfusion . She then fell at home on 11/24- on follow-up in ortho clinic she was noted to have a left hip fracture. This was repaired operatively on 11/29 and she was discharged to [facility] on 12/2  She was seen by ID while inpt due to history of MRSA infection of left knee.  Per Dr. Megan Salon she will be on oral rifampin and doxy for at least 6 months.  She is currently taking both of these meds without difficutly They are seeing Dr. Megan Salon next month.    She was noted to have elevated glucose and an A1c was recommended per DC summary- however this was done while at [facility] and was quite normal  She was noted to drop her hg during most recent hospital likely due to operation. Transfused one unit.  Will repeat CBC today  She is back at her home.  Her grandson is living with her and helping with her care Here today with her son [name] and her DIL.   She is using her walker all the time.   They plan to have OT and PT, and a home aid. This has not been arranged as of yet but she just  left [facility] on 12/22 and it has been the Christmas holiday.  They plan to have this assistance in place soon She needs help with bathing, but she is able to get dressed on her own.  She has not driven since February.    Her goal is to get back to driving and re-gain her independence  No fever or chills noted  Her cardiologist is Dr. [name]- she did have stents in 2008 She has not noted any CP or SOB.  She cannot recall the date of her last bone density - we will schedule this for a few months from now  She developed a left heel pressure ulcer while in the hospital.  They are treating this with a a type of gel ointment.  They think it is slowly improving and have not tried to remove the large eschar from the posterior heel.  She does not have any pain at this time in her heel She also has a mild abrasion on her left knee that is healing- they have been using an abx ointment here    Plan from 07-26-2016 visit:  MRSA (methicillin resistant Staphylococcus aureus) infection - Plan: doxycycline (VIBRA-TABS) 100 MG tablet  Debility  Physical deconditioning  History of hip fracture - Plan: DG BONE  DENSITY (DXA)  Mild protein-calorie malnutrition (HCC) - Plan: Comprehensive metabolic panel  Acute blood loss anemia - Plan: CBC  Osteoporosis, unspecified osteoporosis type, unspecified pathological fracture presence - Plan: DG BONE DENSITY (DXA)  Decubitus ulcer of left heel, unspecified ulcer stage  Here today to establish care Recent hospital stay for a hip fracure- however over the last year she has had a lot of health issues with MRSA infection of her left knee hardware, GI bleed and urosepsis.  She is hopefully on the road to recovery at this time. She is getting home services and recently was discharged from [facility]  Repeat CBC today Monitor CMP Recheck one month   HPI for today's visit: Here today to recheck on her progress- over the last year she has had more  than her share of health problems as outlines above.  Overall she feels about at her baseline, but has noted that her left knee seems "warm" over the last 2-3 days and is more sore than it was. She has an abrasion type wound over the anterior knee that has been present for about 6 weeks- it is not worse but does not seem to be healing either They have not noted any fever, and her appetite is still good- she has gained a few lbs.  She continues to have diarrhea likely due to her abx, but a recent c diff test was negative OT 2x a week, PT 3x a week. No longer using home health nurse.  She is able to dress on her own. Does need some help with bathing  She is still on doxy and rifampin-will be on these for several months due to her since her knee infection.  She has been taking these faithfully She has had a pressure ulcer on the left heel since the time of her hip replacement. They are treating this with a gel dressing and it seems to be stable.  Not healed but also not getting worse She has reduced ROM in the left knee at baseline due to complicated joint replacement- this seems to be at baseline   Wt Readings from Last 3 Encounters:  08/27/16 111 lb 6.4 oz (50.5 kg)  08/07/16 107 lb (48.5 kg)  07/26/16 106 lb 3.2 oz (48.2 kg)      Patient Active Problem List   Diagnosis Date Noted  . Chronic diarrhea 08/07/2016  . Left displaced femoral neck fracture ([place]) 06/26/2016  . MRSA (methicillin resistant Staphylococcus aureus) infection 06/26/2016  . Hypertensive heart disease   . Hydronephrosis   . Obstructive uropathy   . Abnormal echocardiogram 01/23/2016  . Pressure ulcer 01/21/2016  . Chronic infection of prosthetic knee ([place]) 10/12/2015  . AAA 01/19/2009  . ILIAC ARTERY ANEURYSM 01/19/2009  . Hyperlipidemia 01/14/2009  . TOBACCO ABUSE 01/14/2009  . Essential hypertension 01/14/2009  . Coronary atherosclerosis 01/14/2009  . Cerebrovascular disease 01/14/2009  . Peripheral vascular  disease ([place]) 01/14/2009  . CHRONIC OBSTRUCTIVE PULMONARY DISEASE 01/14/2009    Past Medical History:  Diagnosis Date  . Arthritis   . CAD (coronary artery disease)    a. 02/2007 s/p PCI/DES ot LCX and RCA;  b. 07/2014 MV: no ischemia/infarct, EF 68%.  . Carotid arterial disease ([place])    a. 05/2015 Carotid U/S: RICA 2-95%, LICA 18-84%, RECA 1-66%, LECA >50% - overall stable, f/u 1 yr.  . Complication of anesthesia    Three days after procedure pt statrted to suffer with memoy loss that took about 4-5 days to come back  according to pt son [name]."  . Dilated cardiomyopathy ([place])    a. 05/2005 Echo: EF 65%; b. 01/22/2016 Echo: EF 25-30% (in setting of admission for urosepsis w/ elev trop); c. 01/26/2016 Echo: EF 35-40%, inf, distal apical, apical, and mid to distal septal HK-->felt to be 2/2 stress induced CM.  [name] Heart murmur   . History of bronchitis   . Hyperlipidemia   . Hypertension   . Hypertensive heart disease   . Memory impairment   . MRSA (methicillin resistant staph aureus) culture positive   . Nephrolithiasis    a. 12/2015 UVJ stone w/ hydroureteronephrosis req nephrostomy tube placement.  . Osteoarthritis    a. 11/4268 s/p L TKA complicated by infections req skin grafting.  [name] PVD (peripheral vascular disease) ([place])    a. Bilat common iliac dzs, nl ABI's --> med rx.  . Sepsis ([place])    a. 12/2015 admitted with Proteus mirabilis urosepsis/bacteremia.  . Wears glasses     Past Surgical History:  Procedure Laterality Date  . ABDOMINAL HYSTERECTOMY    . APPLICATION OF A-CELL OF EXTREMITY Left 12/08/2015   Procedure: APPLICATION OF A-CELL OF knee;  Surgeon: Loel Lofty [name], DO;  Location: [place];  Service: Plastics;  Laterality: Left;  . APPLICATION OF WOUND VAC Left 11/16/2015   Procedure: APPLICATION OF WOUND VAC;  Surgeon: Loel Lofty [name], DO;  Location: [place];  Service: Plastics;  Laterality: Left;  . APPLICATION OF WOUND VAC Left 12/08/2015   Procedure: APPLICATION OF  WOUND VAC  AND CAST to knee;  Surgeon: Loel Lofty [name], DO;  Location: [place];  Service: Plastics;  Laterality: Left;  . COLONOSCOPY    . CORONARY ANGIOPLASTY  07   3 stents placed  . ESOPHAGOGASTRODUODENOSCOPY    . EYE SURGERY Bilateral    cataracts  . FREE FLAP TO EXTREMITY Left 11/16/2015   Procedure: FREE FLAP TO EXTREMITY;  Surgeon: Loel Lofty [name], DO;  Location: [place];  Service: Plastics;  Laterality: Left;  . I&D EXTREMITY Left 10/18/2015   Procedure: Left Knee Washout, Reclosure;  Surgeon: Newt Minion, MD;  Location: [place];  Service: Orthopedics;  Laterality: Left;  . I&D EXTREMITY Left 12/08/2015   Procedure: IRRIGATION AND DEBRIDEMENT knee;  Surgeon: Loel Lofty [name], DO;  Location: [place];  Service: Plastics;  Laterality: Left;  . I&D KNEE WITH POLY EXCHANGE Left 10/12/2015   Procedure: Irrigation and Debridement , Poly Exchange, Antibiotic Beads Left Knee;  Surgeon: Newt Minion, MD;  Location: [place];  Service: Orthopedics;  Laterality: Left;  . INCISION AND DRAINAGE OF WOUND Left 01/04/2016   Procedure: DEBRIDEMENT OF LEFT KNEE WOUND WITH ACELL AND WOUND VAC PLACEMENT;  Surgeon: Loel Lofty [name], DO;  Location: [place];  Service: Plastics;  Laterality: Left;  . IR GENERIC HISTORICAL  05/11/2016   IR FLUORO GUIDE CV MIDLINE PICC RIGHT 05/11/2016 Sandi Mariscal, MD [facility]-INTER[facility]  . IR GENERIC HISTORICAL  05/11/2016   IR US GUIDE VASC ACCESS RIGHT 05/11/2016 Sandi Mariscal, MD [facility]  . [place]     denies  . SKIN SPLIT GRAFT Left 11/16/2015   Procedure: LEFT GASTROC MUSCLE FLAP WITH SKIN GRAFT SPLIT THICKNESS AND VAC PLACEMENT;  Surgeon: Loel Lofty [name], DO;  Location: [place];  Service: Plastics;  Laterality: Left;  . TOTAL HIP ARTHROPLASTY Right 09/10/2012   Dr Sharol Given  . TOTAL HIP ARTHROPLASTY Right 09/10/2012   Procedure: TOTAL HIP ARTHROPLASTY;  Surgeon: Newt Minion, MD;  Location:  [place] [facility];  Service: Orthopedics;  Laterality: Right;  Right Total Hip Arthroplasty   . TOTAL HIP ARTHROPLASTY Left 06/27/2016   Procedure: LEFT TOTAL HIP ARTHROPLASTY ANTERIOR APPROACH;  Surgeon: Leandrew Koyanagi, MD;  Location: [place];  Service: Orthopedics;  Laterality: Left;  . TOTAL KNEE ARTHROPLASTY Left 09/28/2015   Procedure: TOTAL KNEE ARTHROPLASTY;  Surgeon: Newt Minion, MD;  Location: [place];  Service: Orthopedics;  Laterality: Left;    Social History  Substance Use Topics  . Smoking status: Former Smoker    Packs/day: 0.50    Years: 50.00    Types: Cigarettes    Quit date: 09/28/2015  . Smokeless tobacco: Never Used     Comment: quit 09/27/15  . Alcohol use No    Family History  Problem Relation Age of Onset  . Breast cancer Mother   . Leukemia Father   . Stroke Sister   . Bone cancer      No Known Allergies  Medication list has been reviewed and updated.  Current Outpatient Prescriptions on File Prior to Visit  Medication Sig Dispense Refill  . acetaminophen (TYLENOL) 325 MG tablet Take 2 tablets (650 mg total) by mouth every 6 (six) hours as needed for mild pain (or Fever >/= 101).    [name] acidophilus (RISAQUAD) CAPS capsule Take 1 capsule by mouth daily.    [name] aspirin EC 81 MG tablet Take 81 mg by mouth daily.    [name] atorvastatin (LIPITOR) 40 MG tablet Take 1 tablet (40 mg total) by mouth daily. 30 tablet 4  . budesonide-formoterol (SYMBICORT) 160-4.5 MCG/ACT inhaler Inhale 2 puffs into the lungs 2 (two) times daily.    . carvedilol (COREG) 3.125 MG tablet Take 1 tablet (3.125 mg total) by mouth 2 (two) times daily with a meal. 60 tablet 11  . colchicine 0.6 MG tablet TAKE ONE TO TWO TABLETS DAILY AS NEEDED FOR GOUT. 60 tablet 0  . doxycycline (VIBRA-TABS) 100 MG tablet Take 1 tablet (100 mg total) by mouth 2 (two) times daily with a meal. 180 tablet 3  . feeding supplement, ENSURE ENLIVE, (ENSURE ENLIVE) LIQD Take 237 mLs by mouth 3 (three) times daily between meals. 237 mL 12  . furosemide (LASIX) 20 MG tablet Take 1 tablet (20 mg total) by mouth daily. 30  tablet 6  . lisinopril (PRINIVIL,ZESTRIL) 5 MG tablet Take 1 tablet (5 mg total) by mouth daily. 30 tablet 12  . mesalamine (LIALDA) 1.2 g EC tablet Take 2.4 g by mouth daily with breakfast.    . methocarbamol (ROBAXIN) 500 MG tablet Take 1 tablet (500 mg total) by mouth every 6 (six) hours as needed for muscle spasms. (Patient not taking: Reported on 08/07/2016) 30 tablet 0  . Multiple Vitamin (MULITIVITAMIN WITH MINERALS) TABS Take 1 tablet by mouth daily.    . pantoprazole (PROTONIX) 40 MG tablet Take 40 mg by mouth daily.    . potassium chloride SA (K-DUR,KLOR-CON) 20 MEQ tablet Take 1 tablet (20 mEq total) by mouth daily. 30 tablet 6  . rifampin (RIFADIN) 300 MG capsule Take 2 capsules (600 mg total) by mouth daily. LONG TERM TREATMENT: TILL 11/11/2016 60 capsule 11   No current facility-administered medications on file prior to visit.     Review of Systems:  As per HPI- otherwise negative. Energy level is "ok", no flu symptoms.  Still feels tired, but this is not new since her recent illnesses   Physical Examination: Vitals:   08/27/16 1026  BP:  122/84  Pulse: (!) 114  Temp: 97.5 F (36.4 C)   Vitals:   08/27/16 1026  Weight: 111 lb 6.4 oz (50.5 kg)  Height: '5\' 2"'$  (1.575 m)   Body mass index is 20.38 kg/m. Ideal Body Weight: Weight in (lb) to have BMI = 25: 136.4  GEN: WDWN, NAD, Non-toxic, A & O x 3, slight build but has gained a few lbs.   HEENT: Atraumatic, Normocephalic. Neck supple. No masses, No LAD. Ears and Nose: No external deformity. CV: RRR, No M/G/R. No JVD. No thrill. No extra heart sounds. PULM: CTA B, no wheezes, crackles, rhonchi. No retractions. No resp. distress. No accessory muscle use. ABD: S, NT, ND EXTR: No c/c/e NEURO slow gait, kyphosis. Uses a walker Left knee: she has a scabbed abrasion over the anterior knee.  This appears dry and not acutely infected.  There is no drainage and purulence.  However the area around this abrasion and inferior to  the abrasion is warm and erythematous.  It is mildly tender to palpation.  Pt does not have full extension of the knee at baseline and this seems to be stable.  She has some pain with ROM of the knee- this is always present but she feels like it is worse than usual Left heel: ulcer with thick eschar in place.  This is about the same as at last visit. Perhaps a bit better. Does not appear to be acutely infected  PSYCH: Normally interactive. Conversant. Not depressed or anxious appearing.  Calm demeanor.    Assessment and Plan: Medication monitoring encounter - Plan: CBC with Differential/Platelet, Comprehensive metabolic panel  History of sepsis - Plan: Blood culture (routine single)  Chronic pain of left knee  Here today to recheck on her left knee.  This visit was planned in advance, but it happens that her left knee has become more sore and slightly red over the last 2-3 days.  She is on long term oral doxy and rifampin for a MRSA infection of a prosthetic left knee.   We are also monitoring her renal/ liver function given her rifampin.   Dr. Erlinda Hong with orthopedics is able to see this pt today- we appreciate his input.  I am concerned that she could be developing an infection of her knee again and we do not want to let this get out of hand.   Blood culture and CBC also collected today   Signed Lamar Blinks, MD

## 2016-08-27 NOTE — Progress Notes (Signed)
Office Visit Note   Patient: [name]           Date of Birth: 06-Apr-1941           MRN: [id number] Visit Date: 08/27/2016              Requested by: Darreld Mclean, MD [address] [place], [place] [address] PCP: Lamar Blinks, MD   Assessment & Plan: Visit Diagnoses:  1. Acute pain of left knee     Plan: I think she has some sort of inflammation in her knee joint. I did mark the border of the erythema. She is already on chronic oral antibiotics. Tramadol was prescribed. I will like her to come back in a few days for recheck.  Follow-Up Instructions: Return in about 3 days (around 08/30/2016) for Dondra Prader recheck left knee.   Orders:  No orders of the defined types were placed in this encounter.  Meds ordered this encounter  Medications  . traMADol (ULTRAM) 50 MG tablet    Sig: Take 1 tablet (50 mg total) by mouth every 6 (six) hours as needed.    Dispense:  30 tablet    Refill:  0      Procedures: No procedures performed   Clinical Data: No additional findings.   Subjective: Chief Complaint  Patient presents with  . Left Knee - Pain    Patient comes in today for complaint of left knee pain. She denies any constitutional symptoms. The pain is worse. Weightbearing and pretty much only with weightbearing. The pain is deep down inside. She denies any pain over the redness of the left knee. She denies constitutional symptoms.    Review of Systems Complete review of systems negative except for history of present illness  Objective: Vital Signs: There were no vitals taken for this visit.  Physical Exam  Constitutional: She is oriented to person, place, and time. She appears well-developed and well-nourished.  HENT:  Head: Normocephalic and atraumatic.  Eyes: EOM are normal.  Neck: Neck supple.  Pulmonary/Chest: Effort normal.  Abdominal: Soft.  Neurological: She is alert and oriented to person, place, and time.  Skin: Skin is  warm. Capillary refill takes less than 2 seconds.  Psychiatric: She has a normal mood and affect. Her behavior is normal. Judgment and thought content normal.  Nursing note and vitals reviewed.   Ortho Exam Exam a left knee shows some redness of the left knee and calf region. This is not tender to palpation. There is no drainage. There is a dry scab anteriorly. Range of motion of the knee is good and with minimal pain. Specialty Comments:  No specialty comments available.  Imaging: No results found.   PMFS History: Patient Active Problem List   Diagnosis Date Noted  . Acute pain of left knee 08/27/2016  . Chronic diarrhea 08/07/2016  . Left displaced femoral neck fracture ([place]) 06/26/2016  . MRSA (methicillin resistant Staphylococcus aureus) infection 06/26/2016  . Hypertensive heart disease   . Hydronephrosis   . Obstructive uropathy   . Abnormal echocardiogram 01/23/2016  . Pressure ulcer 01/21/2016  . Chronic infection of prosthetic knee ([place]) 10/12/2015  . AAA 01/19/2009  . ILIAC ARTERY ANEURYSM 01/19/2009  . Hyperlipidemia 01/14/2009  . TOBACCO ABUSE 01/14/2009  . Essential hypertension 01/14/2009  . Coronary atherosclerosis 01/14/2009  . Cerebrovascular disease 01/14/2009  . Peripheral vascular disease ([place]) 01/14/2009  . CHRONIC OBSTRUCTIVE PULMONARY DISEASE 01/14/2009   Past Medical History:  Diagnosis  Date  . Arthritis   . CAD (coronary artery disease)    a. 02/2007 s/p PCI/DES ot LCX and RCA;  b. 07/2014 MV: no ischemia/infarct, EF 68%.  . Carotid arterial disease ([place])    a. 05/2015 Carotid U/S: RICA 0-97%, LICA 35-32%, RECA 9-92%, LECA >50% - overall stable, f/u 1 yr.  . Complication of anesthesia    Three days after procedure pt statrted to suffer with memoy loss that took about 4-5 days to come back according to [name]."  . Dilated cardiomyopathy ([place])    a. 05/2005 Echo: EF 65%; b. 01/22/2016 Echo: EF 25-30% (in setting of admission for urosepsis w/  elev trop); c. 01/26/2016 Echo: EF 35-40%, inf, distal apical, apical, and mid to distal septal HK-->felt to be 2/2 stress induced CM.  [name] Heart murmur   . History of bronchitis   . Hyperlipidemia   . Hypertension   . Hypertensive heart disease   . Memory impairment   . MRSA (methicillin resistant staph aureus) culture positive   . Nephrolithiasis    a. 12/2015 UVJ stone w/ hydroureteronephrosis req nephrostomy tube placement.  . Osteoarthritis    a. 10/2681 s/p L TKA complicated by infections req skin grafting.  [name] PVD (peripheral vascular disease) ([place])    a. Bilat common iliac dzs, nl ABI's --> med rx.  . Sepsis ([place])    a. 12/2015 admitted with Proteus mirabilis urosepsis/bacteremia.  . Wears glasses     Family History  Problem Relation Age of Onset  . Breast cancer Mother   . Leukemia Father   . Stroke Sister   . Bone cancer      Past Surgical History:  Procedure Laterality Date  . ABDOMINAL HYSTERECTOMY    . APPLICATION OF A-CELL OF EXTREMITY Left 12/08/2015   Procedure: APPLICATION OF A-CELL OF knee;  Surgeon: Loel Lofty [name], DO;  Location: [place];  Service: Plastics;  Laterality: Left;  . APPLICATION OF WOUND VAC Left 11/16/2015   Procedure: APPLICATION OF WOUND VAC;  Surgeon: Loel Lofty [name], DO;  Location: [place];  Service: Plastics;  Laterality: Left;  . APPLICATION OF WOUND VAC Left 12/08/2015   Procedure: APPLICATION OF WOUND VAC  AND CAST to knee;  Surgeon: Loel Lofty [name], DO;  Location: [facility];  Service: Plastics;  Laterality: Left;  . COLONOSCOPY    . CORONARY ANGIOPLASTY  07   3 stents placed  . ESOPHAGOGASTRODUODENOSCOPY    . EYE SURGERY Bilateral    cataracts  . FREE FLAP TO EXTREMITY Left 11/16/2015   Procedure: FREE FLAP TO EXTREMITY;  Surgeon: Loel Lofty [name], DO;  Location: [place];  Service: Plastics;  Laterality: Left;  . I&D EXTREMITY Left 10/18/2015   Procedure: Left Knee Washout, Reclosure;  Surgeon: Newt Minion, MD;  Location: [facility];   Service: Orthopedics;  Laterality: Left;  . I&D EXTREMITY Left 12/08/2015   Procedure: IRRIGATION AND DEBRIDEMENT knee;  Surgeon: Loel Lofty [name], DO;  Location: [facility] [place];  Service: Plastics;  Laterality: Left;  . I&D KNEE WITH POLY EXCHANGE Left 10/12/2015   Procedure: Irrigation and Debridement , Poly Exchange, Antibiotic Beads Left Knee;  Surgeon: Newt Minion, MD;  Location: [place];  Service: Orthopedics;  Laterality: Left;  . INCISION AND DRAINAGE OF WOUND Left 01/04/2016   Procedure: DEBRIDEMENT OF LEFT KNEE WOUND WITH ACELL AND WOUND VAC PLACEMENT;  Surgeon: Loel Lofty [name], DO;  Location: [place];  Service: Plastics;  Laterality: Left;  . IR GENERIC  HISTORICAL  05/11/2016   IR FLUORO GUIDE CV MIDLINE PICC RIGHT 05/11/2016 Sandi Mariscal, MD [facility]  . IR GENERIC HISTORICAL  05/11/2016   IR US GUIDE VASC ACCESS RIGHT 05/11/2016 Sandi Mariscal, MD [facility]  . [place]     denies  . SKIN SPLIT GRAFT Left 11/16/2015   Procedure: LEFT GASTROC MUSCLE FLAP WITH SKIN GRAFT SPLIT THICKNESS AND VAC PLACEMENT;  Surgeon: Loel Lofty [name], DO;  Location: [facility];  Service: Plastics;  Laterality: Left;  . TOTAL HIP ARTHROPLASTY Right 09/10/2012   Dr Sharol Given  . TOTAL HIP ARTHROPLASTY Right 09/10/2012   Procedure: TOTAL HIP ARTHROPLASTY;  Surgeon: Newt Minion, MD;  Location: [place];  Service: Orthopedics;  Laterality: Right;  Right Total Hip Arthroplasty  . TOTAL HIP ARTHROPLASTY Left 06/27/2016   Procedure: LEFT TOTAL HIP ARTHROPLASTY ANTERIOR APPROACH;  Surgeon: Leandrew Koyanagi, MD;  Location: [facility];  Service: Orthopedics;  Laterality: Left;  . TOTAL KNEE ARTHROPLASTY Left 09/28/2015   Procedure: TOTAL KNEE ARTHROPLASTY;  Surgeon: Newt Minion, MD;  Location: [facility];  Service: Orthopedics;  Laterality: Left;   Social History   Occupational History  . Retired    Social History Main Topics  . Smoking status: Former Smoker    Packs/day: 0.50    Years: 50.00    Types: Cigarettes    Quit date:  09/28/2015  . Smokeless tobacco: Never Used     Comment: quit 09/27/15  . Alcohol use No  . Drug use: No  . Sexual activity: No

## 2016-08-29 DIAGNOSIS — L89620 Pressure ulcer of left heel, unstageable: Secondary | ICD-10-CM | POA: Diagnosis not present

## 2016-08-29 DIAGNOSIS — T8454XA Infection and inflammatory reaction due to internal left knee prosthesis, initial encounter: Secondary | ICD-10-CM | POA: Diagnosis not present

## 2016-08-29 DIAGNOSIS — I110 Hypertensive heart disease with heart failure: Secondary | ICD-10-CM | POA: Diagnosis not present

## 2016-08-29 DIAGNOSIS — S72002D Fracture of unspecified part of neck of left femur, subsequent encounter for closed fracture with routine healing: Secondary | ICD-10-CM | POA: Diagnosis not present

## 2016-08-29 DIAGNOSIS — I5022 Chronic systolic (congestive) heart failure: Secondary | ICD-10-CM | POA: Diagnosis not present

## 2016-08-29 DIAGNOSIS — I739 Peripheral vascular disease, unspecified: Secondary | ICD-10-CM | POA: Diagnosis not present

## 2016-08-30 DIAGNOSIS — I5022 Chronic systolic (congestive) heart failure: Secondary | ICD-10-CM | POA: Diagnosis not present

## 2016-08-30 DIAGNOSIS — L89620 Pressure ulcer of left heel, unstageable: Secondary | ICD-10-CM | POA: Diagnosis not present

## 2016-08-30 DIAGNOSIS — I739 Peripheral vascular disease, unspecified: Secondary | ICD-10-CM | POA: Diagnosis not present

## 2016-08-30 DIAGNOSIS — T8454XA Infection and inflammatory reaction due to internal left knee prosthesis, initial encounter: Secondary | ICD-10-CM | POA: Diagnosis not present

## 2016-08-30 DIAGNOSIS — I110 Hypertensive heart disease with heart failure: Secondary | ICD-10-CM | POA: Diagnosis not present

## 2016-08-30 DIAGNOSIS — S72002D Fracture of unspecified part of neck of left femur, subsequent encounter for closed fracture with routine healing: Secondary | ICD-10-CM | POA: Diagnosis not present

## 2016-08-31 DIAGNOSIS — T8454XA Infection and inflammatory reaction due to internal left knee prosthesis, initial encounter: Secondary | ICD-10-CM | POA: Diagnosis not present

## 2016-08-31 DIAGNOSIS — S72002D Fracture of unspecified part of neck of left femur, subsequent encounter for closed fracture with routine healing: Secondary | ICD-10-CM | POA: Diagnosis not present

## 2016-08-31 DIAGNOSIS — L89620 Pressure ulcer of left heel, unstageable: Secondary | ICD-10-CM | POA: Diagnosis not present

## 2016-08-31 DIAGNOSIS — I5022 Chronic systolic (congestive) heart failure: Secondary | ICD-10-CM | POA: Diagnosis not present

## 2016-08-31 DIAGNOSIS — I110 Hypertensive heart disease with heart failure: Secondary | ICD-10-CM | POA: Diagnosis not present

## 2016-08-31 DIAGNOSIS — I739 Peripheral vascular disease, unspecified: Secondary | ICD-10-CM | POA: Diagnosis not present

## 2016-09-02 LAB — CULTURE, BLOOD (SINGLE): ORGANISM ID, BACTERIA: NO GROWTH

## 2016-09-03 DIAGNOSIS — I5022 Chronic systolic (congestive) heart failure: Secondary | ICD-10-CM | POA: Diagnosis not present

## 2016-09-03 DIAGNOSIS — I739 Peripheral vascular disease, unspecified: Secondary | ICD-10-CM | POA: Diagnosis not present

## 2016-09-03 DIAGNOSIS — L89620 Pressure ulcer of left heel, unstageable: Secondary | ICD-10-CM | POA: Diagnosis not present

## 2016-09-03 DIAGNOSIS — S72002D Fracture of unspecified part of neck of left femur, subsequent encounter for closed fracture with routine healing: Secondary | ICD-10-CM | POA: Diagnosis not present

## 2016-09-03 DIAGNOSIS — I110 Hypertensive heart disease with heart failure: Secondary | ICD-10-CM | POA: Diagnosis not present

## 2016-09-03 DIAGNOSIS — T8454XA Infection and inflammatory reaction due to internal left knee prosthesis, initial encounter: Secondary | ICD-10-CM | POA: Diagnosis not present

## 2016-09-04 ENCOUNTER — Encounter (INDEPENDENT_AMBULATORY_CARE_PROVIDER_SITE_OTHER): Payer: Self-pay | Admitting: Family

## 2016-09-04 ENCOUNTER — Ambulatory Visit (INDEPENDENT_AMBULATORY_CARE_PROVIDER_SITE_OTHER): Payer: Medicare Other | Admitting: Family

## 2016-09-04 VITALS — Ht 62.0 in | Wt 111.0 lb

## 2016-09-04 DIAGNOSIS — L03116 Cellulitis of left lower limb: Secondary | ICD-10-CM | POA: Diagnosis not present

## 2016-09-04 MED ORDER — SILVER SULFADIAZINE 1 % EX CREA: 1.0000 "application " | TOPICAL_CREAM | Freq: Every day | CUTANEOUS | 0 refills | Status: DC

## 2016-09-04 NOTE — Progress Notes (Signed)
Office Visit Note   Patient: [name]           Date of Birth: Oct 18, 1940           MRN: [id number] Visit Date: 09/04/2016              Requested by: Darreld Mclean, MD [address] [place], [place] [address] PCP: Lamar Blinks, MD  Chief Complaint  Patient presents with  . Left Knee - Follow-up    HPI: Patient is 76 year old woman who presents today in follow-up for left knee pain. Cellulitis 3 days ago was seen by Dr. Erlinda Hong. Complains of continued him knee pain. Him she takes Tylenol and tramadol for pain. She has been on chronic rifampin and doxycycline. Has history of infected left total knee with multiple surgeries over the course of last year.  is following with infectious disease. Next appointment in April.   Also reports a heel ulcer. States this has been ongoing since last spring. At this the first time that she is had anyone evaluated. States that this got started when she was then immobilization and had been resting all of her weight on her heel. Does endorse resting way on her heels him at home and in bed.    Assessment & Plan: Visit Diagnoses: No diagnosis found.  Plan: She will continue with her rifampin and doxycycline him. Left knee looks improved today. She will call or return for any worsening. Has follow-up with Dr. Erlinda Hong next week for her hip at which point we will reevaluate her knee as well. Him for the left heel I provided a prescription for Silvadene as well as a PRAFO boot. She will offload the left heel cleanse wound daily apply Silvadene dressings.  Follow-Up Instructions: No Follow-up on file.   Physical Exam  Constitutional: Appears well-developed.  Head: Normocephalic.  Eyes: EOM are normal.  Neck: Normal range of motion.  Cardiovascular: Normal rate.   Pulmonary/Chest: Effort normal.  Neurological: Is alert.  Skin: Skin is warm.  Psychiatric: Has a normal mood and affect. Left knee: No erythema or edema. No warmth. Does  have minimal swelling. No effusion. Left foot: Has ulceration to posterior heel. This is 15 mm in diameter. Fibrinous exudative tissue in wound bed. No drainage. No surrounding erythema. No odor. Does have depth of 72m. Ortho Exam   Imaging: No results found.  Orders:  No orders of the defined types were placed in this encounter.  No orders of the defined types were placed in this encounter.    Procedures: No procedures performed  Clinical Data: No additional findings.  Subjective: Review of Systems  Constitutional: Negative for chills and fever.  Musculoskeletal: Positive for arthralgias.  Skin: Positive for wound. Negative for color change and rash.    Objective: Vital Signs: Ht '5\' 2"'$  (1.575 m)   Wt 111 lb (50.3 kg)   BMI 20.30 kg/m   Specialty Comments:  No specialty comments available.  PMFS History: Patient Active Problem List   Diagnosis Date Noted  . Acute pain of left knee 08/27/2016  . Chronic diarrhea 08/07/2016  . Left displaced femoral neck fracture ([place] 06/26/2016  . MRSA (methicillin resistant Staphylococcus aureus) infection 06/26/2016  . Hypertensive heart disease   . Hydronephrosis   . Obstructive uropathy   . Abnormal echocardiogram 01/23/2016  . Pressure ulcer 01/21/2016  . Chronic infection of prosthetic knee ([place] 10/12/2015  . AAA 01/19/2009  . ILIAC ARTERY ANEURYSM 01/19/2009  . Hyperlipidemia  01/14/2009  . TOBACCO ABUSE 01/14/2009  . Essential hypertension 01/14/2009  . Coronary atherosclerosis 01/14/2009  . Cerebrovascular disease 01/14/2009  . Peripheral vascular disease ([place]) 01/14/2009  . CHRONIC OBSTRUCTIVE PULMONARY DISEASE 01/14/2009   Past Medical History:  Diagnosis Date  . Arthritis   . CAD (coronary artery disease)    a. 02/2007 s/p PCI/DES ot LCX and RCA;  b. 07/2014 MV: no ischemia/infarct, EF 68%.  . Carotid arterial disease ([place])    a. 05/2015 Carotid U/S: RICA 1-61%, LICA 09-60%, RECA 4-54%, LECA >50% - overall  stable, f/u 1 yr.  . Complication of anesthesia    Three days after procedure pt statrted to suffer with memoy loss that took about 4-5 days to come back according to [name]."  . Dilated cardiomyopathy ([place])    a. 05/2005 Echo: EF 65%; b. 01/22/2016 Echo: EF 25-30% (in setting of admission for urosepsis w/ elev trop); c. 01/26/2016 Echo: EF 35-40%, inf, distal apical, apical, and mid to distal septal HK-->felt to be 2/2 stress induced CM.  [name] Heart murmur   . History of bronchitis   . Hyperlipidemia   . Hypertension   . Hypertensive heart disease   . Memory impairment   . MRSA (methicillin resistant staph aureus) culture positive   . Nephrolithiasis    a. 12/2015 UVJ stone w/ hydroureteronephrosis req nephrostomy tube placement.  . Osteoarthritis    a. 0/9811 s/p L TKA complicated by infections req skin grafting.  [name] PVD (peripheral vascular disease) ([place])    a. Bilat common iliac dzs, nl ABI's --> med rx.  . Sepsis ([place])    a. 12/2015 admitted with Proteus mirabilis urosepsis/bacteremia.  . Wears glasses     Family History  Problem Relation Age of Onset  . Breast cancer Mother   . Leukemia Father   . Stroke Sister   . Bone cancer      Past Surgical History:  Procedure Laterality Date  . ABDOMINAL HYSTERECTOMY    . APPLICATION OF A-CELL OF EXTREMITY Left 12/08/2015   Procedure: APPLICATION OF A-CELL OF knee;  Surgeon: Loel Lofty [name], DO;  Location: [facility];  Service: Plastics;  Laterality: Left;  . APPLICATION OF WOUND VAC Left 11/16/2015   Procedure: APPLICATION OF WOUND VAC;  Surgeon: Loel Lofty [name], DO;  Location: [facility];  Service: Plastics;  Laterality: Left;  . APPLICATION OF WOUND VAC Left 12/08/2015   Procedure: APPLICATION OF WOUND VAC  AND CAST to knee;  Surgeon: Loel Lofty [name], DO;  Location: [facility];  Service: Plastics;  Laterality: Left;  . COLONOSCOPY    . CORONARY ANGIOPLASTY  07   3 stents placed  . ESOPHAGOGASTRODUODENOSCOPY    . EYE SURGERY Bilateral      cataracts  . FREE FLAP TO EXTREMITY Left 11/16/2015   Procedure: FREE FLAP TO EXTREMITY;  Surgeon: Loel Lofty [name], DO;  Location: [facility];  Service: Plastics;  Laterality: Left;  . I&D EXTREMITY Left 10/18/2015   Procedure: Left Knee Washout, Reclosure;  Surgeon: Newt Minion, MD;  Location: [facility];  Service: Orthopedics;  Laterality: Left;  . I&D EXTREMITY Left 12/08/2015   Procedure: IRRIGATION AND DEBRIDEMENT knee;  Surgeon: Loel Lofty [name], DO;  Location: [facility];  Service: Plastics;  Laterality: Left;  . I&D KNEE WITH POLY EXCHANGE Left 10/12/2015   Procedure: Irrigation and Debridement , Poly Exchange, Antibiotic Beads Left Knee;  Surgeon: Newt Minion, MD;  Location: [facility];  Service: Orthopedics;  Laterality: Left;  .  INCISION AND DRAINAGE OF WOUND Left 01/04/2016   Procedure: DEBRIDEMENT OF LEFT KNEE WOUND WITH ACELL AND WOUND VAC PLACEMENT;  Surgeon: Loel Lofty [name], DO;  Location: [facility];  Service: Plastics;  Laterality: Left;  . IR GENERIC HISTORICAL  05/11/2016   IR FLUORO GUIDE CV MIDLINE PICC RIGHT 05/11/2016 Sandi Mariscal, MD [facility]  . IR GENERIC HISTORICAL  05/11/2016   IR US GUIDE VASC ACCESS RIGHT 05/11/2016 Sandi Mariscal, MD [facility]  . [place]     denies  . SKIN SPLIT GRAFT Left 11/16/2015   Procedure: LEFT GASTROC MUSCLE FLAP WITH SKIN GRAFT SPLIT THICKNESS AND VAC PLACEMENT;  Surgeon: Loel Lofty [name], DO;  Location: [facility];  Service: Plastics;  Laterality: Left;  . TOTAL HIP ARTHROPLASTY Right 09/10/2012   Dr Sharol Given  . TOTAL HIP ARTHROPLASTY Right 09/10/2012   Procedure: TOTAL HIP ARTHROPLASTY;  Surgeon: Newt Minion, MD;  Location: [facility];  Service: Orthopedics;  Laterality: Right;  Right Total Hip Arthroplasty  . TOTAL HIP ARTHROPLASTY Left 06/27/2016   Procedure: LEFT TOTAL HIP ARTHROPLASTY ANTERIOR APPROACH;  Surgeon: Leandrew Koyanagi, MD;  Location: [facility];  Service: Orthopedics;  Laterality: Left;  . TOTAL KNEE ARTHROPLASTY Left 09/28/2015   Procedure:  TOTAL KNEE ARTHROPLASTY;  Surgeon: Newt Minion, MD;  Location: [facility];  Service: Orthopedics;  Laterality: Left;   Social History   Occupational History  . Retired    Social History Main Topics  . Smoking status: Former Smoker    Packs/day: 0.50    Years: 50.00    Types: Cigarettes    Quit date: 09/28/2015  . Smokeless tobacco: Never Used     Comment: quit 09/27/15  . Alcohol use No  . Drug use: No  . Sexual activity: No

## 2016-09-05 DIAGNOSIS — S72002D Fracture of unspecified part of neck of left femur, subsequent encounter for closed fracture with routine healing: Secondary | ICD-10-CM | POA: Diagnosis not present

## 2016-09-05 DIAGNOSIS — I110 Hypertensive heart disease with heart failure: Secondary | ICD-10-CM | POA: Diagnosis not present

## 2016-09-05 DIAGNOSIS — I739 Peripheral vascular disease, unspecified: Secondary | ICD-10-CM | POA: Diagnosis not present

## 2016-09-05 DIAGNOSIS — L89620 Pressure ulcer of left heel, unstageable: Secondary | ICD-10-CM | POA: Diagnosis not present

## 2016-09-05 DIAGNOSIS — T8454XA Infection and inflammatory reaction due to internal left knee prosthesis, initial encounter: Secondary | ICD-10-CM | POA: Diagnosis not present

## 2016-09-05 DIAGNOSIS — I5022 Chronic systolic (congestive) heart failure: Secondary | ICD-10-CM | POA: Diagnosis not present

## 2016-09-10 DIAGNOSIS — I739 Peripheral vascular disease, unspecified: Secondary | ICD-10-CM | POA: Diagnosis not present

## 2016-09-10 DIAGNOSIS — T8454XA Infection and inflammatory reaction due to internal left knee prosthesis, initial encounter: Secondary | ICD-10-CM | POA: Diagnosis not present

## 2016-09-10 DIAGNOSIS — I5022 Chronic systolic (congestive) heart failure: Secondary | ICD-10-CM | POA: Diagnosis not present

## 2016-09-10 DIAGNOSIS — I110 Hypertensive heart disease with heart failure: Secondary | ICD-10-CM | POA: Diagnosis not present

## 2016-09-10 DIAGNOSIS — S72002D Fracture of unspecified part of neck of left femur, subsequent encounter for closed fracture with routine healing: Secondary | ICD-10-CM | POA: Diagnosis not present

## 2016-09-10 DIAGNOSIS — L89620 Pressure ulcer of left heel, unstageable: Secondary | ICD-10-CM | POA: Diagnosis not present

## 2016-09-11 ENCOUNTER — Encounter (INDEPENDENT_AMBULATORY_CARE_PROVIDER_SITE_OTHER): Payer: Self-pay | Admitting: Orthopaedic Surgery

## 2016-09-11 ENCOUNTER — Ambulatory Visit (INDEPENDENT_AMBULATORY_CARE_PROVIDER_SITE_OTHER): Payer: Medicare Other | Admitting: Orthopaedic Surgery

## 2016-09-11 DIAGNOSIS — M1711 Unilateral primary osteoarthritis, right knee: Secondary | ICD-10-CM

## 2016-09-11 DIAGNOSIS — S72002A Fracture of unspecified part of neck of left femur, initial encounter for closed fracture: Secondary | ICD-10-CM

## 2016-09-11 NOTE — Progress Notes (Signed)
Patient is 2.5 months s/p left hip hemi doing well.  Hip and knee don't bother her.  Walking with walker without limp.  Patient is doing well at this point.  Continue with HEP.  F/u prn.

## 2016-09-12 DIAGNOSIS — L89620 Pressure ulcer of left heel, unstageable: Secondary | ICD-10-CM | POA: Diagnosis not present

## 2016-09-12 DIAGNOSIS — I5022 Chronic systolic (congestive) heart failure: Secondary | ICD-10-CM | POA: Diagnosis not present

## 2016-09-12 DIAGNOSIS — S72002D Fracture of unspecified part of neck of left femur, subsequent encounter for closed fracture with routine healing: Secondary | ICD-10-CM | POA: Diagnosis not present

## 2016-09-12 DIAGNOSIS — I739 Peripheral vascular disease, unspecified: Secondary | ICD-10-CM | POA: Diagnosis not present

## 2016-09-12 DIAGNOSIS — I110 Hypertensive heart disease with heart failure: Secondary | ICD-10-CM | POA: Diagnosis not present

## 2016-09-12 DIAGNOSIS — T8454XA Infection and inflammatory reaction due to internal left knee prosthesis, initial encounter: Secondary | ICD-10-CM | POA: Diagnosis not present

## 2016-09-13 DIAGNOSIS — I110 Hypertensive heart disease with heart failure: Secondary | ICD-10-CM | POA: Diagnosis not present

## 2016-09-13 DIAGNOSIS — S72002D Fracture of unspecified part of neck of left femur, subsequent encounter for closed fracture with routine healing: Secondary | ICD-10-CM | POA: Diagnosis not present

## 2016-09-13 DIAGNOSIS — T8454XA Infection and inflammatory reaction due to internal left knee prosthesis, initial encounter: Secondary | ICD-10-CM | POA: Diagnosis not present

## 2016-09-13 DIAGNOSIS — I5022 Chronic systolic (congestive) heart failure: Secondary | ICD-10-CM | POA: Diagnosis not present

## 2016-09-13 DIAGNOSIS — I739 Peripheral vascular disease, unspecified: Secondary | ICD-10-CM | POA: Diagnosis not present

## 2016-09-13 DIAGNOSIS — L89620 Pressure ulcer of left heel, unstageable: Secondary | ICD-10-CM | POA: Diagnosis not present

## 2016-09-14 DIAGNOSIS — I110 Hypertensive heart disease with heart failure: Secondary | ICD-10-CM | POA: Diagnosis not present

## 2016-09-14 DIAGNOSIS — S72002D Fracture of unspecified part of neck of left femur, subsequent encounter for closed fracture with routine healing: Secondary | ICD-10-CM | POA: Diagnosis not present

## 2016-09-14 DIAGNOSIS — I739 Peripheral vascular disease, unspecified: Secondary | ICD-10-CM | POA: Diagnosis not present

## 2016-09-14 DIAGNOSIS — I5022 Chronic systolic (congestive) heart failure: Secondary | ICD-10-CM | POA: Diagnosis not present

## 2016-09-14 DIAGNOSIS — L89620 Pressure ulcer of left heel, unstageable: Secondary | ICD-10-CM | POA: Diagnosis not present

## 2016-09-14 DIAGNOSIS — T8454XA Infection and inflammatory reaction due to internal left knee prosthesis, initial encounter: Secondary | ICD-10-CM | POA: Diagnosis not present

## 2016-09-26 ENCOUNTER — Encounter: Payer: Self-pay | Admitting: Gastroenterology

## 2016-10-03 ENCOUNTER — Ambulatory Visit (INDEPENDENT_AMBULATORY_CARE_PROVIDER_SITE_OTHER): Payer: Medicare Other | Admitting: Family

## 2016-10-03 VITALS — Ht 62.0 in | Wt 111.0 lb

## 2016-10-03 DIAGNOSIS — L89612 Pressure ulcer of right heel, stage 2: Secondary | ICD-10-CM | POA: Diagnosis not present

## 2016-10-03 DIAGNOSIS — M75111 Incomplete rotator cuff tear or rupture of right shoulder, not specified as traumatic: Secondary | ICD-10-CM | POA: Diagnosis not present

## 2016-10-03 MED ORDER — LIDOCAINE HCL 1 % IJ SOLN
5.0000 mL | INTRAMUSCULAR | Status: AC | PRN
  Administered 2016-10-03: 5 mL

## 2016-10-03 MED ORDER — METHYLPREDNISOLONE ACETATE 40 MG/ML IJ SUSP
40.0000 mg | INTRAMUSCULAR | Status: AC | PRN
  Administered 2016-10-03: 40 mg via INTRA_ARTICULAR

## 2016-10-03 NOTE — Progress Notes (Signed)
Office Visit Note   Patient: [name]           Date of Birth: Mar 19, 1941           MRN: [id number] Visit Date: 10/03/2016              Requested by: Darreld Mclean, MD [address] [place], [place] [address] PCP: Lamar Blinks, MD  Chief Complaint  Patient presents with  . Left Foot - Wound Check    HPI: Left heel ulcer recheck today. Pt states that she is using silvadene dressing and PRAFO only at night. She does not have a bandage on this morning and confirms that she does not wear one during the day. She is in a regular shoe and the back of the shoe sits directly on top of the ulcer. There is no redness and no drainage. Pt ambulates with a rolling walker. She still continues on her ABX. Pamella Pert, RMA  The patient is a 76 year old woman who presents today in follow-up for left heel ulcer. She has been doing Silvadene dressing changes at night with her [facility] boot that she wears at night. She appears to have misunderstood that she was to wear the Silvadene dressings daily. She is ambulating with a rolling walker with no concerns about her heel today.  Also complains of right shoulder pain. States that she had an injection with Dr. Ernestina Patches little over a month ago for arthritis in her right shoulder. Complains she is unable to lift her arm above her head. Aching constant pain. States the injection was not helpful. Would like to try another injection today if possible . Does state she does not want to have anymore surgery.     Assessment & Plan: Visit Diagnoses:  1. Incomplete tear of right rotator cuff   2. Decubitus ulcer of right heel, stage 2     Plan: Injection right shoulder with DepoMedrol today.   Follow-Up Instructions: No Follow-up on file.   Physical Exam  Constitutional: Appears well-developed.  Head: Normocephalic.  Eyes: EOM are normal.  Neck: Normal range of motion.  Cardiovascular: Normal rate.   Pulmonary/Chest: Effort  normal.  Neurological: Is alert.  Skin: Skin is warm.  Psychiatric: Has a normal mood and affect.  Right Shoulder Exam   Tenderness  The patient is experiencing no tenderness.    Range of Motion  Active Abduction: 80  Passive Abduction: 90  Forward Flexion: normal   Tests  Impingement: positive  Other  Pulse: present    Right heel: Ulcer has filled in some. Surrounding nonviable tissue was debrided manually. There is exudative tissue in wound bed. Ulcer is 12 mm x 4 mm. No surrounding erythema. No drainage. No sign of infection.    Imaging: No results found.  Labs: Lab Results  Component Value Date   HGBA1C 4.9 07/10/2016   ESRSEDRATE 113 (H) 06/22/2016   ESRSEDRATE 140 (H) 10/14/2015   CRP 3.0 (H) 06/22/2016   CRP 24.2 (H) 10/14/2015   REPTSTATUS 01/28/2016 FINAL 01/23/2016   GRAMSTAIN  10/12/2015    ABUNDANT WBC PRESENT, PREDOMINANTLY PMN NO SQUAMOUS EPITHELIAL CELLS SEEN FEW GRAM POSITIVE COCCI IN PAIRS Performed at [facility] NO GROWTH 5 DAYS 01/23/2016   [facility] NO GROWTH 5 DAYS 08/27/2016    Orders:  Orders Placed This Encounter  Procedures  . Large Joint Injection/Arthrocentesis   No orders of the defined types were placed in this encounter.  Procedures: Large Joint Inj Date/Time: 10/03/2016 11:18 AM Performed by: Suzan Slick Authorized by: Dondra Prader R   Consent Given by:  Patient Site marked: the procedure site was marked   Timeout: prior to procedure the correct patient, procedure, and site was verified   Indications:  Pain and diagnostic evaluation Location:  Shoulder Site:  R subacromial bursa Prep: patient was prepped and draped in usual sterile fashion   Needle Size:  22 G Needle Length:  1.5 inches Ultrasound Guidance: No   Fluoroscopic Guidance: No   Arthrogram: No   Medications:  5 mL lidocaine 1 %; 40 mg methylPREDNISolone acetate 40 MG/ML Aspiration Attempted: No   Patient tolerance:  Patient  tolerated the procedure well with no immediate complications    Clinical Data: No additional findings.  Subjective: Review of Systems  Constitutional: Negative for chills and fever.  Cardiovascular: Negative for leg swelling.  Musculoskeletal: Positive for arthralgias. Negative for neck pain and neck stiffness.  Skin: Positive for wound.    Objective: Vital Signs: Ht '5\' 2"'$  (1.575 m)   Wt 111 lb (50.3 kg)   BMI 20.30 kg/m   Specialty Comments:  No specialty comments available.  PMFS History: Patient Active Problem List   Diagnosis Date Noted  . Acute pain of left knee 08/27/2016  . Chronic diarrhea 08/07/2016  . Left displaced femoral neck fracture ([place]) 06/26/2016  . MRSA (methicillin resistant Staphylococcus aureus) infection 06/26/2016  . Hypertensive heart disease   . Hydronephrosis   . Obstructive uropathy   . Abnormal echocardiogram 01/23/2016  . Pressure ulcer 01/21/2016  . Chronic infection of prosthetic knee ([place]) 10/12/2015  . AAA 01/19/2009  . ILIAC ARTERY ANEURYSM 01/19/2009  . Hyperlipidemia 01/14/2009  . TOBACCO ABUSE 01/14/2009  . Essential hypertension 01/14/2009  . Coronary atherosclerosis 01/14/2009  . Cerebrovascular disease 01/14/2009  . Peripheral vascular disease ([facility]) 01/14/2009  . CHRONIC OBSTRUCTIVE PULMONARY DISEASE 01/14/2009   Past Medical History:  Diagnosis Date  . Arthritis   . CAD (coronary artery disease)    a. 02/2007 s/p PCI/DES ot LCX and RCA;  b. 07/2014 MV: no ischemia/infarct, EF 68%.  . Carotid arterial disease ([facility])    a. 05/2015 Carotid U/S: RICA 6-26%, LICA 94-85%, RECA 4-62%, LECA >50% - overall stable, f/u 1 yr.  . Complication of anesthesia    Three days after procedure pt statrted to suffer with memoy loss that took about 4-5 days to come back according to [name]."  . Dilated cardiomyopathy ([facility])    a. 05/2005 Echo: EF 65%; b. 01/22/2016 Echo: EF 25-30% (in setting of admission for urosepsis w/ elev trop); c.  01/26/2016 Echo: EF 35-40%, inf, distal apical, apical, and mid to distal septal HK-->felt to be 2/2 stress induced CM.  [name] Heart murmur   . History of bronchitis   . Hyperlipidemia   . Hypertension   . Hypertensive heart disease   . Memory impairment   . MRSA (methicillin resistant staph aureus) culture positive   . Nephrolithiasis    a. 12/2015 UVJ stone w/ hydroureteronephrosis req nephrostomy tube placement.  . Osteoarthritis    a. 01/349 s/p L TKA complicated by infections req skin grafting.  [name] PVD (peripheral vascular disease) ([facility])    a. Bilat common iliac dzs, nl ABI's --> med rx.  . Sepsis ([facility])    a. 12/2015 admitted with Proteus mirabilis urosepsis/bacteremia.  . Wears glasses     Family History  Problem Relation Age of Onset  . Breast  cancer Mother   . Leukemia Father   . Stroke Sister   . Bone cancer      Past Surgical History:  Procedure Laterality Date  . ABDOMINAL HYSTERECTOMY    . APPLICATION OF A-CELL OF EXTREMITY Left 12/08/2015   Procedure: APPLICATION OF A-CELL OF knee;  Surgeon: Loel Lofty [name], DO;  Location: [facility];  Service: Plastics;  Laterality: Left;  . APPLICATION OF WOUND VAC Left 11/16/2015   Procedure: APPLICATION OF WOUND VAC;  Surgeon: Loel Lofty [name], DO;  Location: [facility];  Service: Plastics;  Laterality: Left;  . APPLICATION OF WOUND VAC Left 12/08/2015   Procedure: APPLICATION OF WOUND VAC  AND CAST to knee;  Surgeon: Loel Lofty [name], DO;  Location: [facility];  Service: Plastics;  Laterality: Left;  . COLONOSCOPY    . CORONARY ANGIOPLASTY  07   3 stents placed  . ESOPHAGOGASTRODUODENOSCOPY    . EYE SURGERY Bilateral    cataracts  . FREE FLAP TO EXTREMITY Left 11/16/2015   Procedure: FREE FLAP TO EXTREMITY;  Surgeon: Loel Lofty [name], DO;  Location: [facility];  Service: Plastics;  Laterality: Left;  . I&D EXTREMITY Left 10/18/2015   Procedure: Left Knee Washout, Reclosure;  Surgeon: Newt Minion, MD;  Location: [facility];  Service: Orthopedics;   Laterality: Left;  . I&D EXTREMITY Left 12/08/2015   Procedure: IRRIGATION AND DEBRIDEMENT knee;  Surgeon: Loel Lofty [name], DO;  Location: [facility];  Service: Plastics;  Laterality: Left;  . I&D KNEE WITH POLY EXCHANGE Left 10/12/2015   Procedure: Irrigation and Debridement , Poly Exchange, Antibiotic Beads Left Knee;  Surgeon: Newt Minion, MD;  Location: [facility];  Service: Orthopedics;  Laterality: Left;  . INCISION AND DRAINAGE OF WOUND Left 01/04/2016   Procedure: DEBRIDEMENT OF LEFT KNEE WOUND WITH ACELL AND WOUND VAC PLACEMENT;  Surgeon: Loel Lofty [name], DO;  Location: [facility];  Service: Plastics;  Laterality: Left;  . IR GENERIC HISTORICAL  05/11/2016   IR FLUORO GUIDE CV MIDLINE PICC RIGHT 05/11/2016 Sandi Mariscal, MD [facility]  . IR GENERIC HISTORICAL  05/11/2016   IR US GUIDE VASC ACCESS RIGHT 05/11/2016 Sandi Mariscal, MD [facility]  . [place]     denies  . SKIN SPLIT GRAFT Left 11/16/2015   Procedure: LEFT GASTROC MUSCLE FLAP WITH SKIN GRAFT SPLIT THICKNESS AND VAC PLACEMENT;  Surgeon: Loel Lofty [name], DO;  Location: [facility];  Service: Plastics;  Laterality: Left;  . TOTAL HIP ARTHROPLASTY Right 09/10/2012   Dr Sharol Given  . TOTAL HIP ARTHROPLASTY Right 09/10/2012   Procedure: TOTAL HIP ARTHROPLASTY;  Surgeon: Newt Minion, MD;  Location: [facility];  Service: Orthopedics;  Laterality: Right;  Right Total Hip Arthroplasty  . TOTAL HIP ARTHROPLASTY Left 06/27/2016   Procedure: LEFT TOTAL HIP ARTHROPLASTY ANTERIOR APPROACH;  Surgeon: Leandrew Koyanagi, MD;  Location: [facility];  Service: Orthopedics;  Laterality: Left;  . TOTAL KNEE ARTHROPLASTY Left 09/28/2015   Procedure: TOTAL KNEE ARTHROPLASTY;  Surgeon: Newt Minion, MD;  Location: [facility];  Service: Orthopedics;  Laterality: Left;   Social History   Occupational History  . Retired    Social History Main Topics  . Smoking status: Former Smoker    Packs/day: 0.50    Years: 50.00    Types: Cigarettes    Quit date: 09/28/2015  . Smokeless  tobacco: Never Used     Comment: quit 09/27/15  . Alcohol use No  . Drug use: No  . Sexual  activity: No

## 2016-10-30 ENCOUNTER — Ambulatory Visit (INDEPENDENT_AMBULATORY_CARE_PROVIDER_SITE_OTHER): Payer: Medicare Other | Admitting: Orthopedic Surgery

## 2016-11-06 ENCOUNTER — Ambulatory Visit (INDEPENDENT_AMBULATORY_CARE_PROVIDER_SITE_OTHER): Payer: Medicare Other | Admitting: Internal Medicine

## 2016-11-06 ENCOUNTER — Encounter: Payer: Self-pay | Admitting: Internal Medicine

## 2016-11-06 ENCOUNTER — Ambulatory Visit (INDEPENDENT_AMBULATORY_CARE_PROVIDER_SITE_OTHER): Payer: Medicare Other | Admitting: Orthopedic Surgery

## 2016-11-06 VITALS — Ht 62.0 in | Wt 111.0 lb

## 2016-11-06 DIAGNOSIS — Z96659 Presence of unspecified artificial knee joint: Secondary | ICD-10-CM

## 2016-11-06 DIAGNOSIS — M75111 Incomplete rotator cuff tear or rupture of right shoulder, not specified as traumatic: Secondary | ICD-10-CM | POA: Diagnosis not present

## 2016-11-06 DIAGNOSIS — L89612 Pressure ulcer of right heel, stage 2: Secondary | ICD-10-CM | POA: Diagnosis not present

## 2016-11-06 DIAGNOSIS — T8459XD Infection and inflammatory reaction due to other internal joint prosthesis, subsequent encounter: Secondary | ICD-10-CM

## 2016-11-06 MED ORDER — NABUMETONE 750 MG PO TABS: 750.0000 mg | ORAL_TABLET | Freq: Two times a day (BID) | ORAL | 3 refills | Status: AC | PRN

## 2016-11-06 NOTE — Progress Notes (Signed)
Office Visit Note   Patient: [name]           Date of Birth: 1941-05-19           MRN: [id number] Visit Date: 11/06/2016              Requested by: Darreld Mclean, MD [address], [place] [address] PCP: Lamar Blinks, MD  Chief Complaint  Patient presents with  . Left Foot - Follow-up    Follow up heel ulcer  . Right Shoulder - Follow-up    s/p injection 10/03/16      HPI: Patient presents in follow-up for several issues #1 she follow-up for left heel decubitus ulcer she is currently using a Band-Aid wearing a PRAFO at home using a rolling walker for ambulation. #2 chronic ulcer left total knee arthroplasty #3 osteoarthritis with rotator cuff pathology right shoulder. Patient states that the injection for the right shoulder did not help at all she is not interested in any further treatment.  Assessment & Plan: Visit Diagnoses:  1. Incomplete tear of right rotator cuff   2. Decubitus ulcer of right heel, stage 2     Plan: Recommended Relafen 1 by mouth daily or twice a day as needed for arthritic pain. Patient is given instructions demonstrated strengthening exercises for her knees. Patient states she is not interested in going to formalize therapy. Patient is also recommended to wear a pair of knee-high compression stockings for her venous insufficiency.  Follow-Up Instructions: Return if symptoms worsen or fail to improve.   Ortho Exam  Patient is alert, oriented, no adenopathy, well-dressed, normal affect, normal respiratory effort. Patient has antalgic gait use a rolling walker for ambulation. Examination she is venous stasis swelling in both legs but no open ulcers. Her left knee wound is completely healed there is no redness no cellulitis no signs of infection. Examination the right shoulder she has pain with range of motion pain with drop arm test. Examination the left heel the decubitus ulcer is a very thin eschar there is no redness  no cellulitis no drainage the wound is approximately 10 mm x 3 mm and 0.1 mm deep.  Imaging: No results found.  Labs: Lab Results  Component Value Date   HGBA1C 4.9 07/10/2016   ESRSEDRATE 113 (H) 06/22/2016   ESRSEDRATE 140 (H) 10/14/2015   CRP 3.0 (H) 06/22/2016   CRP 24.2 (H) 10/14/2015   REPTSTATUS 01/28/2016 FINAL 01/23/2016   GRAMSTAIN  10/12/2015    ABUNDANT WBC PRESENT, PREDOMINANTLY PMN NO SQUAMOUS EPITHELIAL CELLS SEEN FEW GRAM POSITIVE COCCI IN PAIRS Performed at [facility]    CULT NO GROWTH 5 DAYS 01/23/2016   LABORGA NO GROWTH 5 DAYS 08/27/2016    Orders:  No orders of the defined types were placed in this encounter.  Meds ordered this encounter  Medications  . nabumetone (RELAFEN) 750 MG tablet    Sig: Take 1 tablet (750 mg total) by mouth 2 (two) times daily as needed for mild pain or moderate pain. with food    Dispense:  60 tablet    Refill:  3     Procedures: No procedures performed  Clinical Data: No additional findings.  ROS:  All other systems negative, except as noted in the HPI. Review of Systems  Objective: Vital Signs: Ht '5\' 2"'$  (1.575 m)   Wt 111 lb (50.3 kg)   BMI 20.30 kg/m   Specialty Comments:  No specialty comments available.  PMFS History: Patient Active Problem List   Diagnosis Date Noted  . Acute pain of left knee 08/27/2016  . Chronic diarrhea 08/07/2016  . Left displaced femoral neck fracture ([place]) 06/26/2016  . MRSA (methicillin resistant Staphylococcus aureus) infection 06/26/2016  . Hypertensive heart disease   . Hydronephrosis   . Obstructive uropathy   . Abnormal echocardiogram 01/23/2016  . Pressure ulcer 01/21/2016  . Chronic infection of prosthetic knee ([place]) 10/12/2015  . AAA 01/19/2009  . ILIAC ARTERY ANEURYSM 01/19/2009  . Hyperlipidemia 01/14/2009  . TOBACCO ABUSE 01/14/2009  . Essential hypertension 01/14/2009  . Coronary atherosclerosis 01/14/2009  . Cerebrovascular disease 01/14/2009   . Peripheral vascular disease ([place]) 01/14/2009  . CHRONIC OBSTRUCTIVE PULMONARY DISEASE 01/14/2009   Past Medical History:  Diagnosis Date  . Arthritis   . CAD (coronary artery disease)    a. 02/2007 s/p PCI/DES ot LCX and RCA;  b. 07/2014 MV: no ischemia/infarct, EF 68%.  . Carotid arterial disease ([place])    a. 05/2015 Carotid U/S: RICA 2-54%, LICA 27-06%, RECA 2-37%, LECA >50% - overall stable, f/u 1 yr.  . Complication of anesthesia    Three days after procedure pt statrted to suffer with memoy loss that took about 4-5 days to come back according to [name]."  . Dilated cardiomyopathy ([place])    a. 05/2005 Echo: EF 65%; b. 01/22/2016 Echo: EF 25-30% (in setting of admission for urosepsis w/ elev trop); c. 01/26/2016 Echo: EF 35-40%, inf, distal apical, apical, and mid to distal septal HK-->felt to be 2/2 stress induced CM.  [name] Heart murmur   . History of bronchitis   . Hyperlipidemia   . Hypertension   . Hypertensive heart disease   . Memory impairment   . MRSA (methicillin resistant staph aureus) culture positive   . Nephrolithiasis    a. 12/2015 UVJ stone w/ hydroureteronephrosis req nephrostomy tube placement.  . Osteoarthritis    a. 12/2829 s/p L TKA complicated by infections req skin grafting.  [name] PVD (peripheral vascular disease) ([place])    a. Bilat common iliac dzs, nl ABI's --> med rx.  . Sepsis ([place])    a. 12/2015 admitted with Proteus mirabilis urosepsis/bacteremia.  . Wears glasses     Family History  Problem Relation Age of Onset  . Breast cancer Mother   . Leukemia Father   . Stroke Sister   . Bone cancer      Past Surgical History:  Procedure Laterality Date  . ABDOMINAL HYSTERECTOMY    . APPLICATION OF A-CELL OF EXTREMITY Left 12/08/2015   Procedure: APPLICATION OF A-CELL OF knee;  Surgeon: Loel Lofty [name], DO;  Location: [facility];  Service: Plastics;  Laterality: Left;  . APPLICATION OF WOUND VAC Left 11/16/2015   Procedure: APPLICATION OF WOUND VAC;  Surgeon:  Loel Lofty [name], DO;  Location: [facility];  Service: Plastics;  Laterality: Left;  . APPLICATION OF WOUND VAC Left 12/08/2015   Procedure: APPLICATION OF WOUND VAC  AND CAST to knee;  Surgeon: Loel Lofty [name], DO;  Location: [facility];  Service: Plastics;  Laterality: Left;  . COLONOSCOPY    . CORONARY ANGIOPLASTY  07   3 stents placed  . ESOPHAGOGASTRODUODENOSCOPY    . EYE SURGERY Bilateral    cataracts  . FREE FLAP TO EXTREMITY Left 11/16/2015   Procedure: FREE FLAP TO EXTREMITY;  Surgeon: Loel Lofty [name], DO;  Location: [facility];  Service: Plastics;  Laterality: Left;  . I&D EXTREMITY Left 10/18/2015  Procedure: Left Knee Washout, Reclosure;  Surgeon: Newt Minion, MD;  Location: [facility];  Service: Orthopedics;  Laterality: Left;  . I&D EXTREMITY Left 12/08/2015   Procedure: IRRIGATION AND DEBRIDEMENT knee;  Surgeon: Loel Lofty [name], DO;  Location: [facility];  Service: Plastics;  Laterality: Left;  . I&D KNEE WITH POLY EXCHANGE Left 10/12/2015   Procedure: Irrigation and Debridement , Poly Exchange, Antibiotic Beads Left Knee;  Surgeon: Newt Minion, MD;  Location: [facility];  Service: Orthopedics;  Laterality: Left;  . INCISION AND DRAINAGE OF WOUND Left 01/04/2016   Procedure: DEBRIDEMENT OF LEFT KNEE WOUND WITH ACELL AND WOUND VAC PLACEMENT;  Surgeon: Loel Lofty [name], DO;  Location: [facility];  Service: Plastics;  Laterality: Left;  . IR GENERIC HISTORICAL  05/11/2016   IR FLUORO GUIDE CV MIDLINE PICC RIGHT 05/11/2016 Sandi Mariscal, MD [facility]  . IR GENERIC HISTORICAL  05/11/2016   IR US GUIDE VASC ACCESS RIGHT 05/11/2016 Sandi Mariscal, MD [facility]  . [place]     denies  . SKIN SPLIT GRAFT Left 11/16/2015   Procedure: LEFT GASTROC MUSCLE FLAP WITH SKIN GRAFT SPLIT THICKNESS AND VAC PLACEMENT;  Surgeon: Loel Lofty [name], DO;  Location: [facility];  Service: Plastics;  Laterality: Left;  . TOTAL HIP ARTHROPLASTY Right 09/10/2012   Dr Sharol Given  . TOTAL HIP ARTHROPLASTY Right 09/10/2012    Procedure: TOTAL HIP ARTHROPLASTY;  Surgeon: Newt Minion, MD;  Location: [facility];  Service: Orthopedics;  Laterality: Right;  Right Total Hip Arthroplasty  . TOTAL HIP ARTHROPLASTY Left 06/27/2016   Procedure: LEFT TOTAL HIP ARTHROPLASTY ANTERIOR APPROACH;  Surgeon: Leandrew Koyanagi, MD;  Location: [facility];  Service: Orthopedics;  Laterality: Left;  . TOTAL KNEE ARTHROPLASTY Left 09/28/2015   Procedure: TOTAL KNEE ARTHROPLASTY;  Surgeon: Newt Minion, MD;  Location: [facility];  Service: Orthopedics;  Laterality: Left;   Social History   Occupational History  . Retired    Social History Main Topics  . Smoking status: Former Smoker    Packs/day: 0.50    Years: 50.00    Types: Cigarettes    Quit date: 09/28/2015  . Smokeless tobacco: Never Used     Comment: quit 09/27/15  . Alcohol use No  . Drug use: No  . Sexual activity: No

## 2016-11-06 NOTE — Assessment & Plan Note (Signed)
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 Her infection relapse fairly quickly after her doxycycline was stopped last summer. I suspect that that would happen again if she were to stop all antibiotics. However I am comfortable stopping rifampin now. It may be causing some of her frequent bowel movements. I will have her continue doxycycline alone. I will repeat her inflammatory markers today and see her back in 3 months.

## 2016-11-06 NOTE — Progress Notes (Signed)
[name] for Infectious Disease  Patient Active Problem List   Diagnosis Date Noted  . MRSA (methicillin resistant Staphylococcus aureus) infection 06/26/2016    Priority: High  . Chronic infection of prosthetic knee ([place]) 10/12/2015    Priority: High  . Acute pain of left knee 08/27/2016  . Chronic diarrhea 08/07/2016  . Left displaced femoral neck fracture ([place]) 06/26/2016  . Hypertensive heart disease   . Hydronephrosis   . Obstructive uropathy   . Abnormal echocardiogram 01/23/2016  . Pressure ulcer 01/21/2016  . AAA 01/19/2009  . ILIAC ARTERY ANEURYSM 01/19/2009  . Hyperlipidemia 01/14/2009  . TOBACCO ABUSE 01/14/2009  . Essential hypertension 01/14/2009  . Coronary atherosclerosis 01/14/2009  . Cerebrovascular disease 01/14/2009  . Peripheral vascular disease ([place]) 01/14/2009  . CHRONIC OBSTRUCTIVE PULMONARY DISEASE 01/14/2009    Patient's Medications  New Prescriptions   No medications on file  Previous Medications   ACETAMINOPHEN (TYLENOL) 325 MG TABLET    Take 2 tablets (650 mg total) by mouth every 6 (six) hours as needed for mild pain (or Fever >/= 101).   ACIDOPHILUS (RISAQUAD) CAPS CAPSULE    Take 1 capsule by mouth daily.   ASPIRIN EC 81 MG TABLET    Take 81 mg by mouth daily.   ATORVASTATIN (LIPITOR) 40 MG TABLET    Take 1 tablet (40 mg total) by mouth daily.   BUDESONIDE-FORMOTEROL (SYMBICORT) 160-4.5 MCG/ACT INHALER    Inhale 2 puffs into the lungs 2 (two) times daily.   CARVEDILOL (COREG) 3.125 MG TABLET    Take 1 tablet (3.125 mg total) by mouth 2 (two) times daily with a meal.   COLCHICINE 0.6 MG TABLET    TAKE ONE TO TWO TABLETS DAILY AS NEEDED FOR GOUT.   DOXYCYCLINE (VIBRA-TABS) 100 MG TABLET    Take 1 tablet (100 mg total) by mouth 2 (two) times daily with a meal.   FEEDING SUPPLEMENT, ENSURE ENLIVE, (ENSURE ENLIVE) LIQD    Take 237 mLs by mouth 3 (three) times daily between meals.   FUROSEMIDE (LASIX) 20 MG TABLET    Take 1 tablet  (20 mg total) by mouth daily.   LISINOPRIL (PRINIVIL,ZESTRIL) 5 MG TABLET    Take 1 tablet (5 mg total) by mouth daily.   LISINOPRIL (PRINIVIL,ZESTRIL) 5 MG TABLET       MESALAMINE (LIALDA) 1.2 G EC TABLET    Take 2.4 g by mouth daily with breakfast.   METHOCARBAMOL (ROBAXIN) 500 MG TABLET    Take 1 tablet (500 mg total) by mouth every 6 (six) hours as needed for muscle spasms.   MULTIPLE VITAMIN (MULITIVITAMIN WITH MINERALS) TABS    Take 1 tablet by mouth daily.   PANTOPRAZOLE (PROTONIX) 40 MG TABLET    Take 40 mg by mouth daily.   POTASSIUM CHLORIDE SA (K-DUR,KLOR-CON) 20 MEQ TABLET    Take 1 tablet (20 mEq total) by mouth daily.   SILVER SULFADIAZINE (SILVADENE) 1 % CREAM    Apply 1 application topically daily.   TRAMADOL (ULTRAM) 50 MG TABLET    Take 1 tablet (50 mg total) by mouth every 6 (six) hours as needed.  Modified Medications   No medications on file  Discontinued Medications   RIFAMPIN (RIFADIN) 300 MG CAPSULE    Take 2 capsules (600 mg total) by mouth daily. LONG TERM TREATMENT: TILL 11/11/2016    Subjective: His Pacey is in for her routine follow-up visit. She is accompanied by her son.  She continues to take doxycycline and rifampin for her chronic MRSA left prosthetic knee infection. She has had more frequent, loose bowel movements over the past several months. She has not had any abdominal pain, nausea or vomiting. She has not had any fever. Her son tells me that she had a negative C. difficile assay done by her primary care physician in February. She has not had any change in her bowel movement since that time. Her left knee pain waxes and wanes. Overall there has not been any change in her pain since her last visit. Her son says that she gets around fairly well with her walker.  Review of Systems: Review of Systems  Constitutional: Negative for chills, diaphoresis and fever.  Gastrointestinal: Negative for abdominal pain, diarrhea, nausea and vomiting.       As noted in  history of present illness.  Musculoskeletal: Positive for joint pain.    Past Medical History:  Diagnosis Date  . Arthritis   . CAD (coronary artery disease)    a. 02/2007 s/p PCI/DES ot LCX and RCA;  b. 07/2014 MV: no ischemia/infarct, EF 68%.  . Carotid arterial disease ([place])    a. 05/2015 Carotid U/S: RICA 0-93%, LICA 81-82%, RECA 9-93%, LECA >50% - overall stable, f/u 1 yr.  . Complication of anesthesia    Three days after procedure pt statrted to suffer with memoy loss that took about 4-5 days to come back according to [name]."  . Dilated cardiomyopathy ([place])    a. 05/2005 Echo: EF 65%; b. 01/22/2016 Echo: EF 25-30% (in setting of admission for urosepsis w/ elev trop); c. 01/26/2016 Echo: EF 35-40%, inf, distal apical, apical, and mid to distal septal HK-->felt to be 2/2 stress induced CM.  [name] Heart murmur   . History of bronchitis   . Hyperlipidemia   . Hypertension   . Hypertensive heart disease   . Memory impairment   . MRSA (methicillin resistant staph aureus) culture positive   . Nephrolithiasis    a. 12/2015 UVJ stone w/ hydroureteronephrosis req nephrostomy tube placement.  . Osteoarthritis    a. 01/1695 s/p L TKA complicated by infections req skin grafting.  [name] PVD (peripheral vascular disease) ([place])    a. Bilat common iliac dzs, nl ABI's --> med rx.  . Sepsis ([place])    a. 12/2015 admitted with Proteus mirabilis urosepsis/bacteremia.  . Wears glasses     Social History  Substance Use Topics  . Smoking status: Former Smoker    Packs/day: 0.50    Years: 50.00    Types: Cigarettes    Quit date: 09/28/2015  . Smokeless tobacco: Never Used     Comment: quit 09/27/15  . Alcohol use No    Family History  Problem Relation Age of Onset  . Breast cancer Mother   . Leukemia Father   . Stroke Sister   . Bone cancer      No Known Allergies  Objective: Vitals:   11/06/16 1047  Weight: 111 lb (50.3 kg)  Height: '5\' 2"'$  (1.575 m)   Body mass index is 20.3  kg/m.  Physical Exam  Constitutional: She is oriented to person, place, and time.  She looks concerned but is otherwise in no distress. Her son answers most of my questions for her.  Abdominal: Soft. There is no tenderness.  Musculoskeletal:  She has a healed scar over her left knee. There is one small area of scab without any drainage. The skin over the scar is  shiny and red. There is no unusual warmth or swelling.  Neurological: She is alert and oriented to person, place, and time.  Skin: No rash noted.  Psychiatric: Mood and affect normal.    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 Her infection relapse fairly quickly after her doxycycline was stopped last summer. I suspect that that would happen again if she were to stop all antibiotics. However I am comfortable stopping rifampin now. It may be causing some of her frequent bowel movements. I will have her continue doxycycline alone. I will repeat her inflammatory markers today and see her back in 3 months.      Relevant Orders   C-reactive protein   Sedimentation rate       Michel Bickers, MD [facility] [phone] pager   [phone] cell 11/06/2016, 11:10 AM

## 2016-11-07 LAB — SEDIMENTATION RATE: SED RATE: 48 mm/h — AB (ref 0–30)

## 2016-11-07 LAB — C-REACTIVE PROTEIN: CRP: 29.8 mg/L — ABNORMAL HIGH (ref <8.0)

## 2016-11-19 ENCOUNTER — Ambulatory Visit: Payer: Medicare Other | Admitting: Family Medicine

## 2016-11-26 ENCOUNTER — Ambulatory Visit (HOSPITAL_BASED_OUTPATIENT_CLINIC_OR_DEPARTMENT_OTHER)
Admission: RE | Admit: 2016-11-26 | Discharge: 2016-11-26 | Disposition: A | Discharge status: Home / Self Care | Payer: Medicare Other | Source: Ambulatory Visit | Attending: Family Medicine | Admitting: Family Medicine

## 2016-11-26 ENCOUNTER — Telehealth (INDEPENDENT_AMBULATORY_CARE_PROVIDER_SITE_OTHER): Payer: Self-pay | Admitting: Radiology

## 2016-11-26 ENCOUNTER — Encounter: Payer: Self-pay | Admitting: Family Medicine

## 2016-11-26 ENCOUNTER — Ambulatory Visit (INDEPENDENT_AMBULATORY_CARE_PROVIDER_SITE_OTHER): Payer: Medicare Other | Admitting: Family Medicine

## 2016-11-26 VITALS — BP 140/74 | HR 80 | Temp 97.8°F | Ht 62.0 in | Wt 117.8 lb

## 2016-11-26 DIAGNOSIS — R5381 Other malaise: Secondary | ICD-10-CM

## 2016-11-26 DIAGNOSIS — L89629 Pressure ulcer of left heel, unspecified stage: Secondary | ICD-10-CM

## 2016-11-26 DIAGNOSIS — Z96643 Presence of artificial hip joint, bilateral: Secondary | ICD-10-CM | POA: Insufficient documentation

## 2016-11-26 DIAGNOSIS — Z8781 Personal history of (healed) traumatic fracture: Secondary | ICD-10-CM

## 2016-11-26 DIAGNOSIS — M810 Age-related osteoporosis without current pathological fracture: Secondary | ICD-10-CM | POA: Insufficient documentation

## 2016-11-26 DIAGNOSIS — G8929 Other chronic pain: Secondary | ICD-10-CM | POA: Diagnosis not present

## 2016-11-26 DIAGNOSIS — M25562 Pain in left knee: Secondary | ICD-10-CM | POA: Diagnosis not present

## 2016-11-26 NOTE — Progress Notes (Signed)
[place] [facility] [address] [place], [place] [address] [phone]  Date:  11/26/2016   Name:  [name]   DOB:  [birth year]   MRN:  [id number]  PCP:  Lamar Blinks, MD    Chief Complaint: Immunizations   History of Present Illness:  [name] is a 76 y.o. very pleasant female patient who presents with the following:  Last seen by myself about 4 months ago - here today for a follow-up visit.  At her last visit in January we discussed her recent health problems-  Complex PMHx, here today to establishcare. She was most recently admitted from 11/28- 06/30/16 with a LEFThip fracture History of CAP s/p stents x3.  In March 2017 she had a LEFTtotal knee- got a MRSA infection and needed several revisions and follow-up procedures over the next several months. She also became septic due to an obstructing kidney stone in June and was admitted to [facility] in July with a bleeding colonic ulcer requiring blood transfusion . She then fell at home on 11/24- on follow-up in ortho clinic she was noted to have a left hip fracture. This was repaired operatively on 11/29 and she was discharged to [facility] on 12/2  She was seen by ID while inpt due to history of MRSA infection of left knee. Per Dr. Megan Salon she will be on oral rifampin and doxy for at least 6 months. She is currently taking both of these meds without difficutly They are seeing Dr. Megan Salon next month.   Here today with he daughter who contributes to the history.  Her grandson is living with her and offers support.  Lacinda has not started driving again but would like to get back to this.  She feels like her strength is getting better.  She last drove in February of last year so it has not been too long since she was behind the wheel.  They plan to practice in an empty parking lot first which I agree is a good idea  She uses the walker most of the time for  ambulationshe does go without it at times for short distances such as when she is in her bathroom.  She feels more secure with the walker and I encourage her to continue to use it as long as she feels like she needs it She is just on doxycycline now for her left knee- has stopped rifampin She has had a small ulcer on her right heel since her last hospital stay.  However, it seems to be doing very well and healing.  She notes that it does not hurt.  They would like for me to take a look at this today Gabriana notes that her appetite is very good  BP Readings from Last 3 Encounters:  11/26/16 140/74  11/06/16 (!) 145/80  08/27/16 122/84   Wt Readings from Last 3 Encounters:  11/26/16 117 lb 12.8 oz (53.4 kg)  11/06/16 111 lb (50.3 kg)  11/06/16 111 lb (50.3 kg)   Pneumonia immunization given at [facility] last January, she is not sure which shot she got or at which location.  We presume this was the prevnar 13 as she thinks she already had the pneumovax  She is scheduled to have a bone density exam today- will look for this result for her    Patient Active Problem List   Diagnosis Date Noted  . Acute pain of left knee 08/27/2016  .  Chronic diarrhea 08/07/2016  . Left displaced femoral neck fracture ([place]) 06/26/2016  . MRSA (methicillin resistant Staphylococcus aureus) infection 06/26/2016  . Hypertensive heart disease   . Hydronephrosis   . Obstructive uropathy   . Abnormal echocardiogram 01/23/2016  . Pressure ulcer 01/21/2016  . Chronic infection of prosthetic knee ([place]) 10/12/2015  . AAA 01/19/2009  . ILIAC ARTERY ANEURYSM 01/19/2009  . Hyperlipidemia 01/14/2009  . TOBACCO ABUSE 01/14/2009  . Essential hypertension 01/14/2009  . Coronary atherosclerosis 01/14/2009  . Cerebrovascular disease 01/14/2009  . Peripheral vascular disease ([place]) 01/14/2009  . CHRONIC OBSTRUCTIVE PULMONARY DISEASE 01/14/2009    Past Medical History:  Diagnosis Date  . Arthritis   . CAD (coronary  artery disease)    a. 02/2007 s/p PCI/DES ot LCX and RCA;  b. 07/2014 MV: no ischemia/infarct, EF 68%.  . Carotid arterial disease ([place])    a. 05/2015 Carotid U/S: RICA 9-98%, LICA 33-82%, RECA 5-05%, LECA >50% - overall stable, f/u 1 yr.  . Complication of anesthesia    Three days after procedure pt statrted to suffer with memoy loss that took about 4-5 days to come back according to [name]."  . Dilated cardiomyopathy ([place])    a. 05/2005 Echo: EF 65%; b. 01/22/2016 Echo: EF 25-30% (in setting of admission for urosepsis w/ elev trop); c. 01/26/2016 Echo: EF 35-40%, inf, distal apical, apical, and mid to distal septal HK-->felt to be 2/2 stress induced CM.  [name] Heart murmur   . History of bronchitis   . Hyperlipidemia   . Hypertension   . Hypertensive heart disease   . Memory impairment   . MRSA (methicillin resistant staph aureus) culture positive   . Nephrolithiasis    a. 12/2015 UVJ stone w/ hydroureteronephrosis req nephrostomy tube placement.  . Osteoarthritis    a. 09/9765 s/p L TKA complicated by infections req skin grafting.  [name] PVD (peripheral vascular disease) ([place])    a. Bilat common iliac dzs, nl ABI's --> med rx.  . Sepsis ([place])    a. 12/2015 admitted with Proteus mirabilis urosepsis/bacteremia.  . Wears glasses     Past Surgical History:  Procedure Laterality Date  . ABDOMINAL HYSTERECTOMY    . APPLICATION OF A-CELL OF EXTREMITY Left 12/08/2015   Procedure: APPLICATION OF A-CELL OF knee;  Surgeon: Loel Lofty [name], DO;  Location: [place];  Service: Plastics;  Laterality: Left;  . APPLICATION OF WOUND VAC Left 11/16/2015   Procedure: APPLICATION OF WOUND VAC;  Surgeon: Loel Lofty [name], DO;  Location: [place];  Service: Plastics;  Laterality: Left;  . APPLICATION OF WOUND VAC Left 12/08/2015   Procedure: APPLICATION OF WOUND VAC  AND CAST to knee;  Surgeon: Loel Lofty [name], DO;  Location: [place];  Service: Plastics;  Laterality: Left;  . COLONOSCOPY    . CORONARY  ANGIOPLASTY  07   3 stents placed  . ESOPHAGOGASTRODUODENOSCOPY    . EYE SURGERY Bilateral    cataracts  . FREE FLAP TO EXTREMITY Left 11/16/2015   Procedure: FREE FLAP TO EXTREMITY;  Surgeon: Loel Lofty [name], DO;  Location: [place];  Service: Plastics;  Laterality: Left;  . I&D EXTREMITY Left 10/18/2015   Procedure: Left Knee Washout, Reclosure;  Surgeon: Newt Minion, MD;  Location: [place];  Service: Orthopedics;  Laterality: Left;  . I&D EXTREMITY Left 12/08/2015   Procedure: IRRIGATION AND DEBRIDEMENT knee;  Surgeon: Loel Lofty [name], DO;  Location: [place];  Service: Plastics;  Laterality: Left;  . I&D  KNEE WITH POLY EXCHANGE Left 10/12/2015   Procedure: Irrigation and Debridement , Poly Exchange, Antibiotic Beads Left Knee;  Surgeon: Newt Minion, MD;  Location: [place];  Service: Orthopedics;  Laterality: Left;  . INCISION AND DRAINAGE OF WOUND Left 01/04/2016   Procedure: DEBRIDEMENT OF LEFT KNEE WOUND WITH ACELL AND WOUND VAC PLACEMENT;  Surgeon: Loel Lofty [name], DO;  Location: [place];  Service: Plastics;  Laterality: Left;  . IR GENERIC HISTORICAL  05/11/2016   IR FLUORO GUIDE CV MIDLINE PICC RIGHT 05/11/2016 Sandi Mariscal, MD [facility]-INTER[facility] RAD  . IR GENERIC HISTORICAL  05/11/2016   IR US GUIDE VASC ACCESS RIGHT 05/11/2016 Sandi Mariscal, MD [facility]INTER[facility]  . [place]     denies  . SKIN SPLIT GRAFT Left 11/16/2015   Procedure: LEFT GASTROC MUSCLE FLAP WITH SKIN GRAFT SPLIT THICKNESS AND VAC PLACEMENT;  Surgeon: Loel Lofty [name], DO;  Location: [place];  Service: Plastics;  Laterality: Left;  . TOTAL HIP ARTHROPLASTY Right 09/10/2012   Dr Sharol Given  . TOTAL HIP ARTHROPLASTY Right 09/10/2012   Procedure: TOTAL HIP ARTHROPLASTY;  Surgeon: Newt Minion, MD;  Location: [place];  Service: Orthopedics;  Laterality: Right;  Right Total Hip Arthroplasty  . TOTAL HIP ARTHROPLASTY Left 06/27/2016   Procedure: LEFT TOTAL HIP ARTHROPLASTY ANTERIOR APPROACH;  Surgeon: Leandrew Koyanagi, MD;  Location: [place];   Service: Orthopedics;  Laterality: Left;  . TOTAL KNEE ARTHROPLASTY Left 09/28/2015   Procedure: TOTAL KNEE ARTHROPLASTY;  Surgeon: Newt Minion, MD;  Location: [place];  Service: Orthopedics;  Laterality: Left;    Social History  Substance Use Topics  . Smoking status: Former Smoker    Packs/day: 0.50    Years: 50.00    Types: Cigarettes    Quit date: 09/28/2015  . Smokeless tobacco: Never Used     Comment: quit 09/27/15  . Alcohol use No    Family History  Problem Relation Age of Onset  . Breast cancer Mother   . Leukemia Father   . Stroke Sister   . Bone cancer      No Known Allergies  Medication list has been reviewed and updated.  Current Outpatient Prescriptions on File Prior to Visit  Medication Sig Dispense Refill  . acetaminophen (TYLENOL) 325 MG tablet Take 2 tablets (650 mg total) by mouth every 6 (six) hours as needed for mild pain (or Fever >/= 101).    [name] acidophilus (RISAQUAD) CAPS capsule Take 1 capsule by mouth daily.    [name] aspirin EC 81 MG tablet Take 81 mg by mouth daily.    [name] atorvastatin (LIPITOR) 40 MG tablet Take 1 tablet (40 mg total) by mouth daily. 30 tablet 4  . budesonide-formoterol (SYMBICORT) 160-4.5 MCG/ACT inhaler Inhale 2 puffs into the lungs 2 (two) times daily.    . carvedilol (COREG) 3.125 MG tablet Take 1 tablet (3.125 mg total) by mouth 2 (two) times daily with a meal. 60 tablet 11  . colchicine 0.6 MG tablet TAKE ONE TO TWO TABLETS DAILY AS NEEDED FOR GOUT. 60 tablet 0  . doxycycline (VIBRA-TABS) 100 MG tablet Take 1 tablet (100 mg total) by mouth 2 (two) times daily with a meal. 180 tablet 3  . feeding supplement, ENSURE ENLIVE, (ENSURE ENLIVE) LIQD Take 237 mLs by mouth 3 (three) times daily between meals. 237 mL 12  . furosemide (LASIX) 20 MG tablet Take 1 tablet (20 mg total) by mouth daily. 30 tablet 6  . lisinopril (PRINIVIL,ZESTRIL) 5 MG  tablet     . mesalamine (LIALDA) 1.2 g EC tablet Take 2.4 g by mouth daily with breakfast.    .  methocarbamol (ROBAXIN) 500 MG tablet Take 1 tablet (500 mg total) by mouth every 6 (six) hours as needed for muscle spasms. 30 tablet 0  . Multiple Vitamin (MULITIVITAMIN WITH MINERALS) TABS Take 1 tablet by mouth daily.    . nabumetone (RELAFEN) 750 MG tablet Take 1 tablet (750 mg total) by mouth 2 (two) times daily as needed for mild pain or moderate pain. with food 60 tablet 3  . pantoprazole (PROTONIX) 40 MG tablet Take 40 mg by mouth daily.    . potassium chloride SA (K-DUR,KLOR-CON) 20 MEQ tablet Take 1 tablet (20 mEq total) by mouth daily. 30 tablet 6  . silver sulfADIAZINE (SILVADENE) 1 % cream Apply 1 application topically daily. 50 g 0  . traMADol (ULTRAM) 50 MG tablet Take 1 tablet (50 mg total) by mouth every 6 (six) hours as needed. 30 tablet 0  . lisinopril (PRINIVIL,ZESTRIL) 5 MG tablet Take 1 tablet (5 mg total) by mouth daily. 30 tablet 12   No current facility-administered medications on file prior to visit.     Review of Systems:  As per HPI- otherwise negative.   Physical Examination: Vitals:   11/26/16 1229  BP: 140/74  Pulse: 80  Temp: 97.8 F (36.6 C)   Vitals:   11/26/16 1229  Weight: 117 lb 12.8 oz (53.4 kg)  Height: '5\' 2"'$  (1.575 m)   Body mass index is 21.55 kg/m. Ideal Body Weight: Weight in (lb) to have BMI = 25: 136.4  GEN: WDWN, NAD, Non-toxic, A & O x 3, petite, elderly lady who looks well.  She is alert and sharp HEENT: Atraumatic, Normocephalic. Neck supple. No masses, No LAD. Ears and Nose: No external deformity. CV: RRR, No M/G/R. No JVD. No thrill. No extra heart sounds. PULM: CTA B, no wheezes, crackles, rhonchi. No retractions. No resp. distress. No accessory muscle use. EXTR: No c/c/e NEURO walks well with her walker.  Asked her to stand without walker- she is able to get to her feet but is unsteady and did not feel that she could walk alone PSYCH: Normally interactive. Conversant. Not depressed or anxious appearing.  Calm demeanor.   LEFT knee: thickening of joint which is baseline for her. No evidence of acute infection such as redness or heat LEFT heel- remnants of ulcer on her heel- appears to be getting better.  Scab may now be interfering with complete healing.  No redness or heat  Assessment and Plan: S/p left hip fracture - Plan: DG Bone Density  Age related osteoporosis, unspecified pathological fracture presence - Plan: DG Bone Density  Chronic pain of left knee  Physical deconditioning  Decubitus ulcer of left heel, unspecified ulcer stage  Here today for a follow-up visit dexa scan pending Her left knee pain is not severe, she is getting more functional and ambulatory She will continue to work on her strength Discussed her heel ulcer   Received her bone density and will send her a message.  Would encourage medication for osteoporosis  Dg Bone Density  Result Date: 11/26/2016 EXAM: DUAL X-RAY ABSORPTIOMETRY (DXA) FOR BONE MINERAL DENSITY IMPRESSION: Referring Physician:  Darreld Mclean PATIENT: Name: [name], [name] Patient ID: [id number] Birth Date: Jan 22, 1941 Height: 60.5 in. Sex: Female Measured: 11/26/2016 Weight: 116.4 lbs. Indications: Advanced Age, Caucasian, Estrogen Deficiency, Family Hx of Osteoporosis, Height Loss, History of Fracture (Adult),  Post Menopausal, Previous Tobacco User, Rheumatoid Arthritis Fractures: Hip Treatments: Calcium ASSESSMENT: The BMD measured at AP Spine L1-L4 is 0.884 g/cm2 with a T-score of -2.5. This patient is considered osteoporotic according to [facility]) criteria. Patient had bilateral hip replacements. Site Region Measured Date Measured Age WHO YA BMD Classification T-score AP Spine L1-L4 11/26/2016 75.5 Osteoporosis -2.5 0.884 g/cm2 Left Forearm Radius 33% 11/26/2016 75.5 Osteopenia -2.0 0.697 g/cm2 World Health Organization [facility]) criteria for post-menopausal, Caucasian Women: Normal       T-score at or above -1 SD Osteopenia   T-score between -1  and -2.5 SD Osteoporosis T-score at or below -2.5 SD RECOMMENDATION: [place] recommends that FDA-approved medical therapies be considered in postmenopausal women and men age 54 or older with a: 1. Hip or vertebral (clinical or morphometric) fracture. 2. T-score of < -2.5 at the spine or hip. 3. Ten-year fracture probability by FRAX of 3% or greater for hip fracture or 20% or greater for major osteoporotic fracture. All treatment decisions require clinical judgment and consideration of individual patient factors, including patient preferences, co-morbidities, previous drug use, risk factors not captured in the FRAX model (e.g. falls, vitamin D deficiency, increased bone turnover, interval significant decline in bone density) and possible under - or over-estimation of fracture risk by FRAX. All patients should ensure an adequate intake of dietary calcium (1200 mg/d) and vitamin D (800 IU daily) unless contraindicated. FOLLOW-UP: People with diagnosed cases of osteoporosis or at high risk for fracture should have regular bone mineral density tests. For patients eligible for Medicare, routine testing is allowed once every 2 years. The testing frequency can be increased to one year for patients who have rapidly progressing disease, those who are receiving or discontinuing medical therapy to restore bone mass, or have additional risk factors. I have reviewed this report and agree with the above findings. [facility] Radiology Electronically Signed   By: Kerby Moors M.D.   On: 11/26/2016 14:47     Signed Lamar Blinks, MD

## 2016-11-26 NOTE — Patient Instructions (Addendum)
Please ask about getting the Shingrix shot (for shingles) at your drug store Let me know if you need any refills of your medication Your heel looks great!  Work on getting that scab off gradually and gently- I think this will help to get your heel completley healed  If you had a pneumonia shot last summer you likely had the [place] 13- most likely you are up to date on your pneumonia shots  Please let me know if you have any concerns, and let's plan to visit in 4 months  I ordered a bone density scan for you- certainly stop in the imaging suite on the ground floor and see if they can do this for you now!

## 2016-11-27 MED ORDER — IBANDRONATE SODIUM 150 MG PO TABS: ORAL_TABLET | ORAL | 3 refills | Status: DC

## 2016-11-28 MED ORDER — ALENDRONATE SODIUM 70 MG PO TABS: 70.0000 mg | ORAL_TABLET | ORAL | 11 refills | Status: DC

## 2016-11-28 NOTE — Addendum Note (Signed)
Addended by: Lamar Blinks C on: 11/28/2016 05:02 PM   Modules accepted: Orders

## 2016-12-10 ENCOUNTER — Other Ambulatory Visit: Payer: Self-pay

## 2016-12-10 MED ORDER — FUROSEMIDE 20 MG PO TABS: 20.0000 mg | ORAL_TABLET | Freq: Every day | ORAL | 6 refills | Status: DC

## 2016-12-15 ENCOUNTER — Other Ambulatory Visit: Payer: Self-pay | Admitting: Cardiology

## 2016-12-17 NOTE — Telephone Encounter (Signed)
Rx request sent to pharmacy.  

## 2016-12-25 ENCOUNTER — Telehealth: Payer: Self-pay | Admitting: Family Medicine

## 2016-12-25 ENCOUNTER — Other Ambulatory Visit: Payer: Self-pay

## 2016-12-25 MED ORDER — ATORVASTATIN CALCIUM 40 MG PO TABS: 40.0000 mg | ORAL_TABLET | Freq: Every day | ORAL | 0 refills | Status: DC

## 2016-12-25 MED ORDER — TRAMADOL HCL 50 MG PO TABS: 50.0000 mg | ORAL_TABLET | Freq: Four times a day (QID) | ORAL | 0 refills | Status: DC | PRN

## 2016-12-25 NOTE — Telephone Encounter (Signed)
Called her- she has experienced several days of body aches with her last several fosamax doses.  Will have her stop this medication and will look into prolia for her.  Called in tramadol for her to take for joint pains per her request

## 2016-12-25 NOTE — Telephone Encounter (Signed)
Relation to VB:TYOM Call back [place]:  Reason for call:  Patient states alendronate (FOSAMAX) 70 MG tablet is causing joint pain for 5x days, patient in need of clinical advice, requesting d/c medication prescribe, requesting tramdol for joint pain, please advise

## 2017-01-09 ENCOUNTER — Telehealth: Payer: Self-pay | Admitting: Family Medicine

## 2017-01-09 NOTE — Telephone Encounter (Signed)
[place] benefits verified NO PA Covered benefit  Patient may owe approximately $0 OOP

## 2017-01-12 ENCOUNTER — Encounter: Payer: Self-pay | Admitting: Family Medicine

## 2017-01-15 ENCOUNTER — Other Ambulatory Visit (INDEPENDENT_AMBULATORY_CARE_PROVIDER_SITE_OTHER): Payer: Self-pay | Admitting: Orthopaedic Surgery

## 2017-01-15 ENCOUNTER — Ambulatory Visit (INDEPENDENT_AMBULATORY_CARE_PROVIDER_SITE_OTHER): Payer: Medicare Other

## 2017-01-15 ENCOUNTER — Other Ambulatory Visit: Payer: Self-pay | Admitting: Cardiology

## 2017-01-15 ENCOUNTER — Encounter (HOSPITAL_BASED_OUTPATIENT_CLINIC_OR_DEPARTMENT_OTHER): Payer: Self-pay | Admitting: *Deleted

## 2017-01-15 ENCOUNTER — Encounter (INDEPENDENT_AMBULATORY_CARE_PROVIDER_SITE_OTHER): Payer: Self-pay | Admitting: Orthopaedic Surgery

## 2017-01-15 ENCOUNTER — Ambulatory Visit (INDEPENDENT_AMBULATORY_CARE_PROVIDER_SITE_OTHER): Payer: Medicare Other | Admitting: Orthopedic Surgery

## 2017-01-15 ENCOUNTER — Ambulatory Visit (INDEPENDENT_AMBULATORY_CARE_PROVIDER_SITE_OTHER): Payer: Medicare Other | Admitting: Orthopaedic Surgery

## 2017-01-15 DIAGNOSIS — S83015A Lateral dislocation of left patella, initial encounter: Secondary | ICD-10-CM | POA: Diagnosis not present

## 2017-01-15 DIAGNOSIS — M1711 Unilateral primary osteoarthritis, right knee: Secondary | ICD-10-CM | POA: Diagnosis not present

## 2017-01-15 DIAGNOSIS — S83005A Unspecified dislocation of left patella, initial encounter: Secondary | ICD-10-CM

## 2017-01-15 MED ORDER — TRAMADOL HCL 50 MG PO TABS: 50.0000 mg | ORAL_TABLET | Freq: Four times a day (QID) | ORAL | 0 refills | Status: DC | PRN

## 2017-01-15 MED ORDER — TRAMADOL HCL 50 MG PO TABS: 50.0000 mg | ORAL_TABLET | Freq: Four times a day (QID) | ORAL | 2 refills | Status: DC | PRN

## 2017-01-15 NOTE — Progress Notes (Signed)
Office Visit Note   Patient: [name]           Date of Birth: December 29, 1940           MRN: [id number] Visit Date: 01/15/2017              Requested by: Darreld Mclean, MD [address] [place],  [id number] PCP: Darreld Mclean, MD   Assessment & Plan: Visit Diagnoses:  1. Lateral dislocation of left patella, initial encounter   2. Unilateral primary osteoarthritis, right knee     Plan: Patient has a chronically laterally dislocated patella. This is a very complex situation with her previous surgical history. I think the best thing to do is to try to reduce this in the operating room as soon as possible and if I'm able to do that I would immobilize her for 3 weeks and then begin gentle range of motion. If I'm not able to reduce it then the plan will be to leave it dislocated and to see how she does from this. If she continues to have issues of her trouble with ambulation with consider a total patellectomy surgery will be the last resort since she is extremely high risk for postoperative complications.   Follow-Up Instructions: Return if symptoms worsen or fail to improve.   Orders:  Orders Placed This Encounter  Procedures  . XR KNEE 3 VIEW RIGHT  . XR KNEE 3 VIEW LEFT   Meds ordered this encounter  Medications  . traMADol (ULTRAM) 50 MG tablet    Sig: Take 1 tablet (50 mg total) by mouth every 6 (six) hours as needed.    Dispense:  30 tablet    Refill:  2  . traMADol (ULTRAM) 50 MG tablet    Sig: Take 1 tablet (50 mg total) by mouth every 6 (six) hours as needed.    Dispense:  30 tablet    Refill:  0      Procedures: No procedures performed   Clinical Data: No additional findings.   Subjective: Chief Complaint  Patient presents with  . Right Knee - Pain  . Left Knee - Pain    [name] is a 76 year old female who comes in with approximately two-week history of left knee pain. She denies any injuries. She is walking with a walker.  She is status post left total knee replacement in March 5462 that was complicated by wound dehiscence and a chronically draining wound that needed a rotational gastroc flap. She is on lifetime chronic suppressive antibiotics with doxycycline.    Review of Systems  Constitutional: Negative.   HENT: Negative.   Eyes: Negative.   Respiratory: Negative.   Cardiovascular: Negative.   Endocrine: Negative.   Musculoskeletal: Negative.   Neurological: Negative.   Hematological: Negative.   Psychiatric/Behavioral: Negative.   All other systems reviewed and are negative.    Objective: Vital Signs: There were no vitals taken for this visit.  Physical Exam  Constitutional: She is oriented to person, place, and time. She appears well-developed and well-nourished.  HENT:  Head: Normocephalic and atraumatic.  Eyes: EOM are normal.  Neck: Neck supple.  Pulmonary/Chest: Effort normal.  Abdominal: Soft.  Neurological: She is alert and oriented to person, place, and time.  Skin: Skin is warm. Capillary refill takes less than 2 seconds.  Psychiatric: She has a normal mood and affect. Her behavior is normal. Judgment and thought content normal.  Nursing note and vitals reviewed.  Ortho Exam Laterally dislocated patella. I'm not able to move this at all. It feels like a chronically dislocated kneecap. Her wounds have all healed up. Her surgical incisions have fully healed. Range of motion is painful and tender along the medial retinaculum. Specialty Comments:  No specialty comments available.  Imaging: Xr Knee 3 View Left  Result Date: 01/15/2017 Lateral dislocation of patella  Xr Knee 3 View Right  Result Date: 01/15/2017 Advanced degenerative joint disease    PMFS History: Patient Active Problem List   Diagnosis Date Noted  . Lateral dislocation of left patella 01/15/2017  . Unilateral primary osteoarthritis, right knee 01/15/2017  . Osteoporosis 11/26/2016  . Acute pain of  left knee 08/27/2016  . Chronic diarrhea 08/07/2016  . Left displaced femoral neck fracture ([place]) 06/26/2016  . MRSA (methicillin resistant Staphylococcus aureus) infection 06/26/2016  . Hypertensive heart disease   . Hydronephrosis   . Obstructive uropathy   . Abnormal echocardiogram 01/23/2016  . Pressure ulcer 01/21/2016  . Chronic infection of prosthetic knee ([place]) 10/12/2015  . AAA 01/19/2009  . ILIAC ARTERY ANEURYSM 01/19/2009  . Hyperlipidemia 01/14/2009  . TOBACCO ABUSE 01/14/2009  . Essential hypertension 01/14/2009  . Coronary atherosclerosis 01/14/2009  . Cerebrovascular disease 01/14/2009  . Peripheral vascular disease ([place]) 01/14/2009  . CHRONIC OBSTRUCTIVE PULMONARY DISEASE 01/14/2009   Past Medical History:  Diagnosis Date  . Arthritis   . CAD (coronary artery disease)    a. 02/2007 s/p PCI/DES ot LCX and RCA;  b. 07/2014 MV: no ischemia/infarct, EF 68%.  . Carotid arterial disease ([place])    a. 05/2015 Carotid U/S: RICA 7-67%, LICA 20-94%, RECA 7-09%, LECA >50% - overall stable, f/u 1 yr.  . Complication of anesthesia    Three days after procedure pt statrted to suffer with memoy loss that took about 4-5 days to come back according to pt son [name]."  . Dilated cardiomyopathy ([place])    a. 05/2005 Echo: EF 65%; b. 01/22/2016 Echo: EF 25-30% (in setting of admission for urosepsis w/ elev trop); c. 01/26/2016 Echo: EF 35-40%, inf, distal apical, apical, and mid to distal septal HK-->felt to be 2/2 stress induced CM.  [name] Heart murmur   . History of bronchitis   . Hyperlipidemia   . Hypertension   . Hypertensive heart disease   . Memory impairment   . MRSA (methicillin resistant staph aureus) culture positive   . Nephrolithiasis    a. 12/2015 UVJ stone w/ hydroureteronephrosis req nephrostomy tube placement.  . Osteoarthritis    a. 12/2834 s/p L TKA complicated by infections req skin grafting.  [name] PVD (peripheral vascular disease) ([place])    a. Bilat common iliac dzs, nl ABI's  --> med rx.  . Sepsis ([place])    a. 12/2015 admitted with Proteus mirabilis urosepsis/bacteremia.  . Wears glasses     Family History  Problem Relation Age of Onset  . Breast cancer Mother   . Leukemia Father   . Stroke Sister   . Bone cancer Unknown     Past Surgical History:  Procedure Laterality Date  . ABDOMINAL HYSTERECTOMY    . APPLICATION OF A-CELL OF EXTREMITY Left 12/08/2015   Procedure: APPLICATION OF A-CELL OF knee;  Surgeon: Loel Lofty [name], DO;  Location: [facility];  Service: Plastics;  Laterality: Left;  . APPLICATION OF WOUND VAC Left 11/16/2015   Procedure: APPLICATION OF WOUND VAC;  Surgeon: Loel Lofty [name], DO;  Location: [facility];  Service: Plastics;  Laterality: Left;  . APPLICATION  OF WOUND VAC Left 12/08/2015   Procedure: APPLICATION OF WOUND VAC  AND CAST to knee;  Surgeon: Loel Lofty [name], DO;  Location: [facility];  Service: Plastics;  Laterality: Left;  . COLONOSCOPY    . CORONARY ANGIOPLASTY  07   3 stents placed  . ESOPHAGOGASTRODUODENOSCOPY    . EYE SURGERY Bilateral    cataracts  . FREE FLAP TO EXTREMITY Left 11/16/2015   Procedure: FREE FLAP TO EXTREMITY;  Surgeon: Loel Lofty [name], DO;  Location: [facility];  Service: Plastics;  Laterality: Left;  . I&D EXTREMITY Left 10/18/2015   Procedure: Left Knee Washout, Reclosure;  Surgeon: Newt Minion, MD;  Location: [facility];  Service: Orthopedics;  Laterality: Left;  . I&D EXTREMITY Left 12/08/2015   Procedure: IRRIGATION AND DEBRIDEMENT knee;  Surgeon: Loel Lofty [name], DO;  Location: [facility];  Service: Plastics;  Laterality: Left;  . I&D KNEE WITH POLY EXCHANGE Left 10/12/2015   Procedure: Irrigation and Debridement , Poly Exchange, Antibiotic Beads Left Knee;  Surgeon: Newt Minion, MD;  Location: [facility];  Service: Orthopedics;  Laterality: Left;  . INCISION AND DRAINAGE OF WOUND Left 01/04/2016   Procedure: DEBRIDEMENT OF LEFT KNEE WOUND WITH ACELL AND WOUND VAC PLACEMENT;  Surgeon: Loel Lofty [name], DO;   Location: [facility];  Service: Plastics;  Laterality: Left;  . IR GENERIC HISTORICAL  05/11/2016   IR FLUORO GUIDE CV MIDLINE PICC RIGHT 05/11/2016 Sandi Mariscal, MD [facility]  . IR GENERIC HISTORICAL  05/11/2016   IR US GUIDE VASC ACCESS RIGHT 05/11/2016 Sandi Mariscal, MD [facility]  . [place]     denies  . SKIN SPLIT GRAFT Left 11/16/2015   Procedure: LEFT GASTROC MUSCLE FLAP WITH SKIN GRAFT SPLIT THICKNESS AND VAC PLACEMENT;  Surgeon: Loel Lofty [name], DO;  Location: [facility];  Service: Plastics;  Laterality: Left;  . TOTAL HIP ARTHROPLASTY Right 09/10/2012   Dr Sharol Given  . TOTAL HIP ARTHROPLASTY Right 09/10/2012   Procedure: TOTAL HIP ARTHROPLASTY;  Surgeon: Newt Minion, MD;  Location: [facility];  Service: Orthopedics;  Laterality: Right;  Right Total Hip Arthroplasty  . TOTAL HIP ARTHROPLASTY Left 06/27/2016   Procedure: LEFT TOTAL HIP ARTHROPLASTY ANTERIOR APPROACH;  Surgeon: Leandrew Koyanagi, MD;  Location: [facility];  Service: Orthopedics;  Laterality: Left;  . TOTAL KNEE ARTHROPLASTY Left 09/28/2015   Procedure: TOTAL KNEE ARTHROPLASTY;  Surgeon: Newt Minion, MD;  Location: [facility];  Service: Orthopedics;  Laterality: Left;   Social History   Occupational History  . Retired    Social History Main Topics  . Smoking status: Former Smoker    Packs/day: 0.50    Years: 50.00    Types: Cigarettes    Quit date: 09/28/2015  . Smokeless tobacco: Never Used     Comment: quit 09/27/15  . Alcohol use No  . Drug use: No  . Sexual activity: No

## 2017-01-16 ENCOUNTER — Ambulatory Visit (HOSPITAL_BASED_OUTPATIENT_CLINIC_OR_DEPARTMENT_OTHER): Payer: Medicare Other | Admitting: Anesthesiology

## 2017-01-16 ENCOUNTER — Ambulatory Visit (HOSPITAL_COMMUNITY): Payer: Medicare Other

## 2017-01-16 ENCOUNTER — Ambulatory Visit (HOSPITAL_BASED_OUTPATIENT_CLINIC_OR_DEPARTMENT_OTHER)
Admission: RE | Admit: 2017-01-16 | Discharge: 2017-01-16 | Disposition: A | Discharge status: Home / Self Care | Payer: Medicare Other | Source: Ambulatory Visit | Attending: Orthopaedic Surgery | Admitting: Orthopaedic Surgery

## 2017-01-16 ENCOUNTER — Encounter (HOSPITAL_BASED_OUTPATIENT_CLINIC_OR_DEPARTMENT_OTHER)
Admission: RE | Disposition: A | Discharge status: Home / Self Care | Payer: Self-pay | Source: Ambulatory Visit | Attending: Orthopaedic Surgery

## 2017-01-16 ENCOUNTER — Encounter (HOSPITAL_BASED_OUTPATIENT_CLINIC_OR_DEPARTMENT_OTHER): Payer: Self-pay | Admitting: Anesthesiology

## 2017-01-16 DIAGNOSIS — R011 Cardiac murmur, unspecified: Secondary | ICD-10-CM | POA: Diagnosis not present

## 2017-01-16 DIAGNOSIS — R413 Other amnesia: Secondary | ICD-10-CM | POA: Diagnosis not present

## 2017-01-16 DIAGNOSIS — Z87891 Personal history of nicotine dependence: Secondary | ICD-10-CM | POA: Diagnosis not present

## 2017-01-16 DIAGNOSIS — M2202 Recurrent dislocation of patella, left knee: Secondary | ICD-10-CM | POA: Insufficient documentation

## 2017-01-16 DIAGNOSIS — J449 Chronic obstructive pulmonary disease, unspecified: Secondary | ICD-10-CM | POA: Insufficient documentation

## 2017-01-16 DIAGNOSIS — I2510 Atherosclerotic heart disease of native coronary artery without angina pectoris: Secondary | ICD-10-CM | POA: Diagnosis not present

## 2017-01-16 DIAGNOSIS — Z8614 Personal history of Methicillin resistant Staphylococcus aureus infection: Secondary | ICD-10-CM | POA: Diagnosis not present

## 2017-01-16 DIAGNOSIS — S83005A Unspecified dislocation of left patella, initial encounter: Secondary | ICD-10-CM | POA: Diagnosis not present

## 2017-01-16 DIAGNOSIS — M223X2 Other derangements of patella, left knee: Secondary | ICD-10-CM | POA: Diagnosis present

## 2017-01-16 DIAGNOSIS — I739 Peripheral vascular disease, unspecified: Secondary | ICD-10-CM | POA: Diagnosis not present

## 2017-01-16 DIAGNOSIS — M1990 Unspecified osteoarthritis, unspecified site: Secondary | ICD-10-CM | POA: Diagnosis not present

## 2017-01-16 DIAGNOSIS — Z96652 Presence of left artificial knee joint: Secondary | ICD-10-CM | POA: Diagnosis not present

## 2017-01-16 DIAGNOSIS — Z96642 Presence of left artificial hip joint: Secondary | ICD-10-CM | POA: Diagnosis not present

## 2017-01-16 DIAGNOSIS — N289 Disorder of kidney and ureter, unspecified: Secondary | ICD-10-CM | POA: Diagnosis not present

## 2017-01-16 DIAGNOSIS — I10 Essential (primary) hypertension: Secondary | ICD-10-CM | POA: Diagnosis not present

## 2017-01-16 DIAGNOSIS — I420 Dilated cardiomyopathy: Secondary | ICD-10-CM | POA: Insufficient documentation

## 2017-01-16 DIAGNOSIS — E785 Hyperlipidemia, unspecified: Secondary | ICD-10-CM | POA: Insufficient documentation

## 2017-01-16 DIAGNOSIS — Z79899 Other long term (current) drug therapy: Secondary | ICD-10-CM | POA: Diagnosis not present

## 2017-01-16 DIAGNOSIS — I70203 Unspecified atherosclerosis of native arteries of extremities, bilateral legs: Secondary | ICD-10-CM | POA: Insufficient documentation

## 2017-01-16 DIAGNOSIS — I6523 Occlusion and stenosis of bilateral carotid arteries: Secondary | ICD-10-CM | POA: Insufficient documentation

## 2017-01-16 DIAGNOSIS — I119 Hypertensive heart disease without heart failure: Secondary | ICD-10-CM | POA: Diagnosis not present

## 2017-01-16 DIAGNOSIS — Z955 Presence of coronary angioplasty implant and graft: Secondary | ICD-10-CM | POA: Insufficient documentation

## 2017-01-16 DIAGNOSIS — Z7982 Long term (current) use of aspirin: Secondary | ICD-10-CM | POA: Diagnosis not present

## 2017-01-16 DIAGNOSIS — S83015D Lateral dislocation of left patella, subsequent encounter: Secondary | ICD-10-CM | POA: Diagnosis not present

## 2017-01-16 HISTORY — PX: KNEE CLOSED REDUCTION: SHX995

## 2017-01-16 LAB — POCT I-STAT, CHEM 8
BUN: 20 mg/dL (ref 6–20)
CALCIUM ION: 1.35 mmol/L (ref 1.15–1.40)
CREATININE: 0.8 mg/dL (ref 0.44–1.00)
Chloride: 102 mmol/L (ref 101–111)
GLUCOSE: 90 mg/dL (ref 65–99)
HCT: 39 % (ref 36.0–46.0)
HEMOGLOBIN: 13.3 g/dL (ref 12.0–15.0)
POTASSIUM: 4.5 mmol/L (ref 3.5–5.1)
Sodium: 140 mmol/L (ref 135–145)
TCO2: 26 mmol/L (ref 0–100)

## 2017-01-16 SURGERY — MANIPULATION, KNEE, CLOSED
Anesthesia: General | Site: Knee | Laterality: Left

## 2017-01-16 MED ORDER — PROPOFOL 10 MG/ML IV BOLUS
INTRAVENOUS | Status: AC
  Filled 2017-01-16: qty 20

## 2017-01-16 MED ORDER — SCOPOLAMINE 1 MG/3DAYS TD PT72: 1.0000 | MEDICATED_PATCH | Freq: Once | TRANSDERMAL | Status: DC | PRN

## 2017-01-16 MED ORDER — ONDANSETRON HCL 4 MG/2ML IJ SOLN
INTRAMUSCULAR | Status: DC | PRN
  Administered 2017-01-16: 4 mg via INTRAVENOUS

## 2017-01-16 MED ORDER — CEFAZOLIN SODIUM-DEXTROSE 2-4 GM/100ML-% IV SOLN
INTRAVENOUS | Status: AC
  Filled 2017-01-16: qty 100

## 2017-01-16 MED ORDER — LACTATED RINGERS IV SOLN
INTRAVENOUS | Status: DC
  Administered 2017-01-16: 10 mL/h via INTRAVENOUS

## 2017-01-16 MED ORDER — CEFAZOLIN SODIUM-DEXTROSE 2-4 GM/100ML-% IV SOLN
2.0000 g | INTRAVENOUS | Status: AC
  Administered 2017-01-16: 2 g via INTRAVENOUS

## 2017-01-16 MED ORDER — PROPOFOL 10 MG/ML IV BOLUS
INTRAVENOUS | Status: DC | PRN
  Administered 2017-01-16 (×2): 50 mg via INTRAVENOUS

## 2017-01-16 MED ORDER — FENTANYL CITRATE (PF) 100 MCG/2ML IJ SOLN: 50.0000 ug | INTRAMUSCULAR | Status: DC | PRN

## 2017-01-16 MED ORDER — FENTANYL CITRATE (PF) 100 MCG/2ML IJ SOLN: 25.0000 ug | INTRAMUSCULAR | Status: DC | PRN

## 2017-01-16 MED ORDER — GLYCOPYRROLATE 0.2 MG/ML IJ SOLN
INTRAMUSCULAR | Status: DC | PRN
  Administered 2017-01-16: 0.2 mg via INTRAVENOUS

## 2017-01-16 MED ORDER — MIDAZOLAM HCL 2 MG/2ML IJ SOLN: 1.0000 mg | INTRAMUSCULAR | Status: DC | PRN

## 2017-01-16 MED ORDER — FENTANYL CITRATE (PF) 100 MCG/2ML IJ SOLN
INTRAMUSCULAR | Status: AC
  Filled 2017-01-16: qty 2

## 2017-01-16 MED ORDER — FENTANYL CITRATE (PF) 100 MCG/2ML IJ SOLN
INTRAMUSCULAR | Status: DC | PRN
  Administered 2017-01-16: 50 ug via INTRAVENOUS

## 2017-01-16 MED ORDER — PHENYLEPHRINE HCL 10 MG/ML IJ SOLN
INTRAMUSCULAR | Status: DC | PRN
  Administered 2017-01-16 (×2): 80 ug via INTRAVENOUS

## 2017-01-16 SURGICAL SUPPLY — 14 items
GLOVE SKINSENSE NS SZ7.5 (GLOVE)
GLOVE SKINSENSE STRL SZ7.5 (GLOVE) ×1 IMPLANT
GLOVE SURG SYN 7.5  E (GLOVE)
GLOVE SURG SYN 7.5 E (GLOVE) IMPLANT
GLOVE SURG SYN 7.5 PF PI (GLOVE) ×1 IMPLANT
GOWN STRL REIN XL XLG (GOWN DISPOSABLE) ×1 IMPLANT
KNEE WRAP E Z 3 GEL PACK (MISCELLANEOUS) ×1 IMPLANT
NDL SAFETY ECLIPSE 18X1.5 (NEEDLE) ×1 IMPLANT
NEEDLE HYPO 18GX1.5 SHARP (NEEDLE)
NEEDLE HYPO 22GX1.5 SAFETY (NEEDLE) ×1 IMPLANT
PAD ALCOHOL SWAB (MISCELLANEOUS) ×5 IMPLANT
SYR 20CC LL (SYRINGE) IMPLANT
SYR CONTROL 10ML LL (SYRINGE) IMPLANT
TOWEL OR 17X24 6PK STRL BLUE (TOWEL DISPOSABLE) ×1 IMPLANT

## 2017-01-16 NOTE — Op Note (Signed)
   Date of Surgery: 01/16/2017  INDICATIONS: [name] is a 76 y.o.-year-old female with a left dislocated patella;  The patient did consent to the procedure after discussion of the risks and benefits.  PREOPERATIVE DIAGNOSIS: Left lateral patellar dislocation  POSTOPERATIVE DIAGNOSIS: Same.  PROCEDURE: Manipulation of left knee under anesthesia  SURGEON: N. Eduard Roux, M.D.  ASSIST: None.  ANESTHESIA:  MAC  IV FLUIDS AND URINE: See anesthesia.  ESTIMATED BLOOD LOSS: None mL.  IMPLANTS: Not applicable  DRAINS: Not applicable  COMPLICATIONS: None.  DESCRIPTION OF PROCEDURE: The patient was brought to the operating room and placed supine on the operating table.  The patient had been signed prior to the procedure and this was documented. The patient had the anesthesia placed by the anesthesiologist.  A time-out was performed to confirm that this was the correct patient, site, side and location.  Once the patient was adequately anesthetized I fully extended her knee is much I could. I attempted to reduce the patella. I was not able to bypass the patella whatsoever. I then manipulated the knee through a full range of motion under live fluoroscopy and this demonstrated that the patella was chronically dislocated and had no movement or possibility of relocation. Patient tolerated procedure well and was transferred to the PACU in stable condition.  POSTOPERATIVE PLAN: Patient will be discharged home. We will get her set up with a hinged knee brace  N. Eduard Roux, MD [place] 11:36 AM

## 2017-01-16 NOTE — Telephone Encounter (Signed)
REFILL 

## 2017-01-16 NOTE — Anesthesia Postprocedure Evaluation (Signed)
Anesthesia Post Note  Patient: [name]  Procedure(s) Performed: Procedure(s) (LRB): CLOSED MANIPULATION LEFT KNEE (Left)     Patient location during evaluation: PACU Anesthesia Type: General Level of consciousness: awake Pain management: pain level controlled Respiratory status: spontaneous breathing Cardiovascular status: stable Anesthetic complications: no    Last Vitals:  Vitals:   01/16/17 1205 01/16/17 1219  BP:  (!) 144/80  Pulse: 90 87  Resp: 15 16  Temp:  36.6 C    Last Pain:  Vitals:   01/16/17 1142  TempSrc:   PainSc: 0-No pain                 [name]

## 2017-01-16 NOTE — H&P (Signed)
PREOPERATIVE H&P  Chief Complaint: left knee patellar dislocation  HPI: [name] is a 76 y.o. female who presents for surgical treatment of left knee patellar dislocation.  She denies any changes in medical history.  Past Medical History:  Diagnosis Date  . Arthritis   . CAD (coronary artery disease)    a. 02/2007 s/p PCI/DES ot LCX and RCA;  b. 07/2014 MV: no ischemia/infarct, EF 68%.  . Carotid arterial disease ([place])    a. 05/2015 Carotid U/S: RICA 8-25%, LICA 05-39%, RECA 7-67%, LECA >50% - overall stable, f/u 1 yr.  . Complication of anesthesia    Three days after procedure pt statrted to suffer with memoy loss that took about 4-5 days to come back according to [name]."  . Dilated cardiomyopathy ([place])    a. 05/2005 Echo: EF 65%; b. 01/22/2016 Echo: EF 25-30% (in setting of admission for urosepsis w/ elev trop); c. 01/26/2016 Echo: EF 35-40%, inf, distal apical, apical, and mid to distal septal HK-->felt to be 2/2 stress induced CM.  [name] Heart murmur   . History of bronchitis   . Hyperlipidemia   . Hypertension   . Hypertensive heart disease   . Memory impairment   . MRSA (methicillin resistant staph aureus) culture positive   . Nephrolithiasis    a. 12/2015 UVJ stone w/ hydroureteronephrosis req nephrostomy tube placement.  . Osteoarthritis    a. 09/4191 s/p L TKA complicated by infections req skin grafting.  [name] PVD (peripheral vascular disease) ([place])    a. Bilat common iliac dzs, nl ABI's --> med rx.  . Sepsis ([place])    a. 12/2015 admitted with Proteus mirabilis urosepsis/bacteremia.  . Wears glasses    Past Surgical History:  Procedure Laterality Date  . ABDOMINAL HYSTERECTOMY    . APPLICATION OF A-CELL OF EXTREMITY Left 12/08/2015   Procedure: APPLICATION OF A-CELL OF knee;  Surgeon: Loel Lofty [name], DO;  Location: [facility];  Service: Plastics;  Laterality: Left;  . APPLICATION OF WOUND VAC Left 11/16/2015   Procedure: APPLICATION OF WOUND VAC;  Surgeon: Loel Lofty  [name], DO;  Location: [facility];  Service: Plastics;  Laterality: Left;  . APPLICATION OF WOUND VAC Left 12/08/2015   Procedure: APPLICATION OF WOUND VAC  AND CAST to knee;  Surgeon: Loel Lofty [name], DO;  Location: [facility];  Service: Plastics;  Laterality: Left;  . COLONOSCOPY    . CORONARY ANGIOPLASTY  07   3 stents placed  . ESOPHAGOGASTRODUODENOSCOPY    . EYE SURGERY Bilateral    cataracts  . FREE FLAP TO EXTREMITY Left 11/16/2015   Procedure: FREE FLAP TO EXTREMITY;  Surgeon: Loel Lofty [name], DO;  Location: [facility];  Service: Plastics;  Laterality: Left;  . I&D EXTREMITY Left 10/18/2015   Procedure: Left Knee Washout, Reclosure;  Surgeon: Newt Minion, MD;  Location: [facility];  Service: Orthopedics;  Laterality: Left;  . I&D EXTREMITY Left 12/08/2015   Procedure: IRRIGATION AND DEBRIDEMENT knee;  Surgeon: Loel Lofty [name], DO;  Location: [facility];  Service: Plastics;  Laterality: Left;  . I&D KNEE WITH POLY EXCHANGE Left 10/12/2015   Procedure: Irrigation and Debridement , Poly Exchange, Antibiotic Beads Left Knee;  Surgeon: Newt Minion, MD;  Location: [facility];  Service: Orthopedics;  Laterality: Left;  . INCISION AND DRAINAGE OF WOUND Left 01/04/2016   Procedure: DEBRIDEMENT OF LEFT KNEE WOUND WITH ACELL AND WOUND VAC PLACEMENT;  Surgeon: Loel Lofty [name], DO;  Location: [facility];  Service: Clinical cytogeneticist;  Laterality: Left;  . IR GENERIC HISTORICAL  05/11/2016   IR FLUORO GUIDE CV MIDLINE PICC RIGHT 05/11/2016 Sandi Mariscal, MD [facility]  . IR GENERIC HISTORICAL  05/11/2016   IR US GUIDE VASC ACCESS RIGHT 05/11/2016 Sandi Mariscal, MD [facility]  . [place]     denies  . SKIN SPLIT GRAFT Left 11/16/2015   Procedure: LEFT GASTROC MUSCLE FLAP WITH SKIN GRAFT SPLIT THICKNESS AND VAC PLACEMENT;  Surgeon: Loel Lofty [name], DO;  Location: [facility];  Service: Plastics;  Laterality: Left;  . TOTAL HIP ARTHROPLASTY Right 09/10/2012   Dr Sharol Given  . TOTAL HIP ARTHROPLASTY Right 09/10/2012   Procedure:  TOTAL HIP ARTHROPLASTY;  Surgeon: Newt Minion, MD;  Location: [facility];  Service: Orthopedics;  Laterality: Right;  Right Total Hip Arthroplasty  . TOTAL HIP ARTHROPLASTY Left 06/27/2016   Procedure: LEFT TOTAL HIP ARTHROPLASTY ANTERIOR APPROACH;  Surgeon: Leandrew Koyanagi, MD;  Location: [facility];  Service: Orthopedics;  Laterality: Left;  . TOTAL KNEE ARTHROPLASTY Left 09/28/2015   Procedure: TOTAL KNEE ARTHROPLASTY;  Surgeon: Newt Minion, MD;  Location: [facility];  Service: Orthopedics;  Laterality: Left;   Social History   Social History  . Marital status: Single    Spouse name: N/A  . Number of children: N/A  . Years of education: N/A   Occupational History  . Retired    Social History Main Topics  . Smoking status: Former Smoker    Packs/day: 0.50    Years: 50.00    Types: Cigarettes    Quit date: 09/28/2015  . Smokeless tobacco: Never Used     Comment: quit 09/27/15  . Alcohol use No  . Drug use: No  . Sexual activity: No   Other Topics Concern  . None   Social History Narrative   She has been living with her daughter when not in rehab since the surgery in March.   Family History  Problem Relation Age of Onset  . Breast cancer Mother   . Leukemia Father   . Stroke Sister   . Bone cancer Unknown    No Known Allergies Prior to Admission medications   Medication Sig Start Date End Date Taking? Authorizing Provider  acetaminophen (TYLENOL) 325 MG tablet Take 2 tablets (650 mg total) by mouth every 6 (six) hours as needed for mild pain (or Fever >/= 101). 06/30/16  Yes Bonnielee Haff, MD  acidophilus (RISAQUAD) CAPS capsule Take 1 capsule by mouth daily.   Yes [provider]  aspirin EC 81 MG tablet Take 81 mg by mouth daily.   Yes [provider]  atorvastatin (LIPITOR) 40 MG tablet Take 1 tablet (40 mg total) by mouth daily. PLEASE MAKE APPOINTMENT FOR FURTHER REFILLS 12/25/16  Yes Lelon Perla, MD  budesonide-formoterol [facility]) 160-4.5 MCG/ACT inhaler  Inhale 2 puffs into the lungs 2 (two) times daily.   Yes [provider]  carvedilol (COREG) 3.125 MG tablet Take 1 tablet (3.125 mg total) by mouth 2 (two) times daily with a meal. 05/03/16  Yes Rogelia Mire, NP  furosemide (LASIX) 20 MG tablet Take 1 tablet (20 mg total) by mouth daily. 12/10/16  Yes Lelon Perla, MD  lisinopril (PRINIVIL,ZESTRIL) 5 MG tablet Take 1 tablet (5 mg total) by mouth daily. 04/13/16 01/15/17 Yes Lelon Perla, MD  mesalamine (LIALDA) 1.2 g EC tablet Take 2.4 g by mouth daily with breakfast. 04/11/16 04/11/17 Yes [provider]  Multiple Vitamin (  MULITIVITAMIN WITH MINERALS) TABS Take 1 tablet by mouth daily.   Yes [provider]  potassium chloride SA (KLOR-CON M20) 20 MEQ tablet Take 1 tablet (20 mEq total) by mouth daily. Please schedule appointment for refills. 12/17/16  Yes Lelon Perla, MD  traMADol (ULTRAM) 50 MG tablet Take 1 tablet (50 mg total) by mouth every 6 (six) hours as needed. 01/15/17  Yes Leandrew Koyanagi, MD  silver sulfADIAZINE (SILVADENE) 1 % cream Apply 1 application topically daily. 09/04/16   Suzan Slick, NP     Positive ROS: All other systems have been reviewed and were otherwise negative with the exception of those mentioned in the HPI and as above.  Physical Exam: General: Alert, no acute distress Cardiovascular: No pedal edema Respiratory: No cyanosis, no use of accessory musculature GI: abdomen soft Skin: No lesions in the area of chief complaint Neurologic: Sensation intact distally Psychiatric: Patient is competent for consent with normal mood and affect Lymphatic: no lymphedema  MUSCULOSKELETAL: exam stable  Assessment: left knee patellar dislocation  Plan: Plan for Procedure(s): CLOSED MANIPULATION LEFT KNEE  The risks benefits and alternatives were discussed with the patient including but not limited to the risks of nonoperative treatment, versus surgical intervention including  infection, bleeding, nerve injury,  blood clots, cardiopulmonary complications, morbidity, mortality, among others, and they were willing to proceed.   Eduard Roux, MD   01/16/2017 8:21 AM

## 2017-01-16 NOTE — Anesthesia Preprocedure Evaluation (Addendum)
Anesthesia Evaluation  Patient identified by MRN, date of birth, ID band Patient awake    Reviewed: Allergy & Precautions, NPO status , Patient's Chart, lab work & pertinent test results  History of Anesthesia Complications (+) PSEUDOCHOLINESTERASE DEFICIENCY  Airway Mallampati: I  TM Distance: >3 FB     Dental   Pulmonary COPD, former smoker,    breath sounds clear to auscultation       Cardiovascular hypertension, + CAD and + Peripheral Vascular Disease  + Valvular Problems/Murmurs  Rhythm:Regular Rate:Normal     Neuro/Psych    GI/Hepatic negative GI ROS, Neg liver ROS,   Endo/Other    Renal/GU Renal disease     Musculoskeletal   Abdominal   Peds  Hematology   Anesthesia Other Findings   Reproductive/Obstetrics                             Anesthesia Physical Anesthesia Plan  ASA: III  Anesthesia Plan: General   Post-op Pain Management:    Induction: Intravenous  PONV Risk Score and Plan: 3 and Ondansetron, Dexamethasone and Propofol  Airway Management Planned: Mask  Additional Equipment:   Intra-op Plan:   Post-operative Plan: Extubation in OR  Informed Consent: I have reviewed the patients History and Physical, chart, labs and discussed the procedure including the risks, benefits and alternatives for the proposed anesthesia with the patient or authorized representative who has indicated his/her understanding and acceptance.   Dental advisory given  Plan Discussed with: CRNA, Anesthesiologist and Surgeon  Anesthesia Plan Comments:        Anesthesia Quick Evaluation

## 2017-01-16 NOTE — Transfer of Care (Signed)
Immediate Anesthesia Transfer of Care Note  Patient: [name]  Procedure(s) Performed: Procedure(s): CLOSED MANIPULATION LEFT KNEE (Left)  Patient Location: PACU  Anesthesia Type:MAC  Level of Consciousness: awake and patient cooperative  Airway & Oxygen Therapy: Patient Spontanous Breathing and Patient connected to face mask oxygen  Post-op Assessment: Report given to RN and Post -op Vital signs reviewed and stable  Post vital signs: Reviewed and stable  Last Vitals:  Vitals:   01/16/17 1142 01/16/17 1145  BP: 138/69 130/78  Pulse: 82 84  Resp: 10 12  Temp: 36.6 C     Last Pain:  Vitals:   01/16/17 1142  TempSrc:   PainSc: 0-No pain      Patients Stated Pain Goal: 2 (78/97/84 7841)  Complications: No apparent anesthesia complications

## 2017-01-16 NOTE — Discharge Instructions (Signed)

## 2017-01-17 ENCOUNTER — Encounter (HOSPITAL_BASED_OUTPATIENT_CLINIC_OR_DEPARTMENT_OTHER): Payer: Self-pay | Admitting: Orthopaedic Surgery

## 2017-01-22 ENCOUNTER — Ambulatory Visit (INDEPENDENT_AMBULATORY_CARE_PROVIDER_SITE_OTHER): Payer: Medicare Other | Admitting: Orthopaedic Surgery

## 2017-01-22 ENCOUNTER — Encounter (INDEPENDENT_AMBULATORY_CARE_PROVIDER_SITE_OTHER): Payer: Self-pay | Admitting: Orthopaedic Surgery

## 2017-01-22 DIAGNOSIS — S83015A Lateral dislocation of left patella, initial encounter: Secondary | ICD-10-CM

## 2017-01-22 NOTE — Progress Notes (Signed)
[name] is one-week status post manipulation of her left patella. She comes in today for follow-up. She's also here to get fitted for her hinged knee brace. She is complaining of pain chronically. She is able to ambulate with a Rollator. We had a long discussion today about being as conservative as can. I think it's reasonable to try to treat her with a hinged knee brace. I would expect the pain to the improve. She is a very poor surgical candidate with high risk of complications. She understands and agrees to plan. We'll see her back as needed.

## 2017-01-24 ENCOUNTER — Encounter (HOSPITAL_BASED_OUTPATIENT_CLINIC_OR_DEPARTMENT_OTHER): Payer: Self-pay | Admitting: Orthopaedic Surgery

## 2017-01-28 ENCOUNTER — Encounter (INDEPENDENT_AMBULATORY_CARE_PROVIDER_SITE_OTHER): Payer: Self-pay | Admitting: Orthopaedic Surgery

## 2017-01-28 ENCOUNTER — Ambulatory Visit (INDEPENDENT_AMBULATORY_CARE_PROVIDER_SITE_OTHER): Payer: Medicare Other | Admitting: Orthopaedic Surgery

## 2017-01-28 DIAGNOSIS — L03116 Cellulitis of left lower limb: Secondary | ICD-10-CM

## 2017-01-28 MED ORDER — MUPIROCIN 2 % EX OINT: 1.0000 "application " | TOPICAL_OINTMENT | Freq: Two times a day (BID) | CUTANEOUS | 0 refills | Status: DC

## 2017-01-28 NOTE — Progress Notes (Signed)
Office Visit Note   Patient: [name]           Date of Birth: [birth year]           MRN: [id number] Visit Date: 01/28/2017              Requested by: Darreld Mclean, MD [address] [place],  [address] PCP: Darreld Mclean, MD   Assessment & Plan: Visit Diagnoses:  1. Cellulitis of left lower extremity     Plan: Bactroban ointment twice a day with Band-Aid. Reassurances given. No evidence of infection. Follow-up with me as needed.  Follow-Up Instructions: Return if symptoms worsen or fail to improve.   Orders:  No orders of the defined types were placed in this encounter.  Meds ordered this encounter  Medications  . mupirocin ointment (BACTROBAN) 2 %    Sig: Apply 1 application topically 2 (two) times daily.    Dispense:  22 g    Refill:  0      Procedures: No procedures performed   Clinical Data: No additional findings.   Subjective: Chief Complaint  Patient presents with  . Left Knee - Follow-up    Hit knee on a kitchen chair and puncture the knot that is on her knee, now it is draining    [name] comes back today for a new superficial left knee. She bumped it against a table. She denies any significant symptoms of infection.    Review of Systems   Objective: Vital Signs: There were no vitals taken for this visit.  Physical Exam  Ortho Exam Left knee exam shows a superficial area with abrasion. There is no real drainage. No cellulitis. Specialty Comments:  No specialty comments available.  Imaging: No results found.   PMFS History: Patient Active Problem List   Diagnosis Date Noted  . Lateral dislocation of left patella 01/15/2017  . Unilateral primary osteoarthritis, right knee 01/15/2017  . Osteoporosis 11/26/2016  . Acute pain of left knee 08/27/2016  . Chronic diarrhea 08/07/2016  . Left displaced femoral neck fracture ([place]) 06/26/2016  . MRSA (methicillin resistant Staphylococcus aureus) infection  06/26/2016  . Hypertensive heart disease   . Hydronephrosis   . Obstructive uropathy   . Abnormal echocardiogram 01/23/2016  . Pressure ulcer 01/21/2016  . Chronic infection of prosthetic knee ([place]) 10/12/2015  . AAA 01/19/2009  . ILIAC ARTERY ANEURYSM 01/19/2009  . Hyperlipidemia 01/14/2009  . TOBACCO ABUSE 01/14/2009  . Essential hypertension 01/14/2009  . Coronary atherosclerosis 01/14/2009  . Cerebrovascular disease 01/14/2009  . Peripheral vascular disease ([place]) 01/14/2009  . CHRONIC OBSTRUCTIVE PULMONARY DISEASE 01/14/2009   Past Medical History:  Diagnosis Date  . Arthritis   . CAD (coronary artery disease)    a. 02/2007 s/p PCI/DES ot LCX and RCA;  b. 07/2014 MV: no ischemia/infarct, EF 68%.  . Carotid arterial disease ([place])    a. 05/2015 Carotid U/S: RICA 5-63%, LICA 87-56%, RECA 4-33%, LECA >50% - overall stable, f/u 1 yr.  . Complication of anesthesia    Three days after procedure pt statrted to suffer with memoy loss that took about 4-5 days to come back according to [name]."  . Dilated cardiomyopathy ([place])    a. 05/2005 Echo: EF 65%; b. 01/22/2016 Echo: EF 25-30% (in setting of admission for urosepsis w/ elev trop); c. 01/26/2016 Echo: EF 35-40%, inf, distal apical, apical, and mid to distal septal HK-->felt to be 2/2 stress induced CM.  [name]  Heart murmur   . History of bronchitis   . Hyperlipidemia   . Hypertension   . Hypertensive heart disease   . Memory impairment   . MRSA (methicillin resistant staph aureus) culture positive   . Nephrolithiasis    a. 12/2015 UVJ stone w/ hydroureteronephrosis req nephrostomy tube placement.  . Osteoarthritis    a. 0/8144 s/p L TKA complicated by infections req skin grafting.  [name] PVD (peripheral vascular disease) ([place])    a. Bilat common iliac dzs, nl ABI's --> med rx.  . Sepsis ([place])    a. 12/2015 admitted with Proteus mirabilis urosepsis/bacteremia.  . Wears glasses     Family History  Problem Relation Age of Onset  .  Breast cancer Mother   . Leukemia Father   . Stroke Sister   . Bone cancer Unknown     Past Surgical History:  Procedure Laterality Date  . ABDOMINAL HYSTERECTOMY    . APPLICATION OF A-CELL OF EXTREMITY Left 12/08/2015   Procedure: APPLICATION OF A-CELL OF knee;  Surgeon: Loel Lofty [name], DO;  Location: [facility];  Service: Plastics;  Laterality: Left;  . APPLICATION OF WOUND VAC Left 11/16/2015   Procedure: APPLICATION OF WOUND VAC;  Surgeon: Loel Lofty [name], DO;  Location: [facility];  Service: Plastics;  Laterality: Left;  . APPLICATION OF WOUND VAC Left 12/08/2015   Procedure: APPLICATION OF WOUND VAC  AND CAST to knee;  Surgeon: Loel Lofty [name], DO;  Location: [facility];  Service: Plastics;  Laterality: Left;  . COLONOSCOPY    . CORONARY ANGIOPLASTY  07   3 stents placed  . ESOPHAGOGASTRODUODENOSCOPY    . EYE SURGERY Bilateral    cataracts  . FREE FLAP TO EXTREMITY Left 11/16/2015   Procedure: FREE FLAP TO EXTREMITY;  Surgeon: Loel Lofty [name], DO;  Location: [facility];  Service: Plastics;  Laterality: Left;  . I&D EXTREMITY Left 10/18/2015   Procedure: Left Knee Washout, Reclosure;  Surgeon: Newt Minion, MD;  Location: [facility];  Service: Orthopedics;  Laterality: Left;  . I&D EXTREMITY Left 12/08/2015   Procedure: IRRIGATION AND DEBRIDEMENT knee;  Surgeon: Loel Lofty [name], DO;  Location: [facility];  Service: Plastics;  Laterality: Left;  . I&D KNEE WITH POLY EXCHANGE Left 10/12/2015   Procedure: Irrigation and Debridement , Poly Exchange, Antibiotic Beads Left Knee;  Surgeon: Newt Minion, MD;  Location: [facility];  Service: Orthopedics;  Laterality: Left;  . INCISION AND DRAINAGE OF WOUND Left 01/04/2016   Procedure: DEBRIDEMENT OF LEFT KNEE WOUND WITH ACELL AND WOUND VAC PLACEMENT;  Surgeon: Loel Lofty [name], DO;  Location: [facility];  Service: Plastics;  Laterality: Left;  . IR GENERIC HISTORICAL  05/11/2016   IR FLUORO GUIDE CV MIDLINE PICC RIGHT 05/11/2016 Sandi Mariscal, MD [facility]  .  IR GENERIC HISTORICAL  05/11/2016   IR US GUIDE VASC ACCESS RIGHT 05/11/2016 Sandi Mariscal, MD [facility]  . KNEE CLOSED REDUCTION Left 01/16/2017   Procedure: CLOSED MANIPULATION LEFT KNEE;  Surgeon: Leandrew Koyanagi, MD;  Location: [facility];  Service: Orthopedics;  Laterality: Left;  . KNEE SURGERY     denies  . SKIN SPLIT GRAFT Left 11/16/2015   Procedure: LEFT GASTROC MUSCLE FLAP WITH SKIN GRAFT SPLIT THICKNESS AND VAC PLACEMENT;  Surgeon: Loel Lofty [name], DO;  Location: [facility];  Service: Plastics;  Laterality: Left;  . TOTAL HIP ARTHROPLASTY Right 09/10/2012   Dr Sharol Given  . TOTAL HIP ARTHROPLASTY Right 09/10/2012   Procedure:  TOTAL HIP ARTHROPLASTY;  Surgeon: Newt Minion, MD;  Location: [facility];  Service: Orthopedics;  Laterality: Right;  Right Total Hip Arthroplasty  . TOTAL HIP ARTHROPLASTY Left 06/27/2016   Procedure: LEFT TOTAL HIP ARTHROPLASTY ANTERIOR APPROACH;  Surgeon: Leandrew Koyanagi, MD;  Location: [facility];  Service: Orthopedics;  Laterality: Left;  . TOTAL KNEE ARTHROPLASTY Left 09/28/2015   Procedure: TOTAL KNEE ARTHROPLASTY;  Surgeon: Newt Minion, MD;  Location: [facility];  Service: Orthopedics;  Laterality: Left;   Social History   Occupational History  . Retired    Social History Main Topics  . Smoking status: Former Smoker    Packs/day: 0.50    Years: 50.00    Types: Cigarettes    Quit date: 09/28/2015  . Smokeless tobacco: Never Used     Comment: quit 09/27/15  . Alcohol use No  . Drug use: No  . Sexual activity: No

## 2017-02-12 ENCOUNTER — Other Ambulatory Visit: Payer: Self-pay | Admitting: Cardiology

## 2017-02-12 ENCOUNTER — Encounter: Payer: Self-pay | Admitting: Internal Medicine

## 2017-02-12 ENCOUNTER — Ambulatory Visit (INDEPENDENT_AMBULATORY_CARE_PROVIDER_SITE_OTHER): Payer: Medicare Other | Admitting: Internal Medicine

## 2017-02-12 DIAGNOSIS — T8459XD Infection and inflammatory reaction due to other internal joint prosthesis, subsequent encounter: Secondary | ICD-10-CM | POA: Diagnosis present

## 2017-02-12 DIAGNOSIS — Z96659 Presence of unspecified artificial knee joint: Secondary | ICD-10-CM

## 2017-02-12 MED ORDER — RIFAMPIN 300 MG PO CAPS: 300.0000 mg | ORAL_CAPSULE | Freq: Two times a day (BID) | ORAL | 11 refills | Status: DC

## 2017-02-12 NOTE — Progress Notes (Signed)
[facility] for Infectious Disease  Patient Active Problem List   Diagnosis Date Noted  . MRSA (methicillin resistant Staphylococcus aureus) infection 06/26/2016    Priority: High  . Chronic infection of prosthetic knee ([place]) 10/12/2015    Priority: High  . Lateral dislocation of left patella 01/15/2017  . Unilateral primary osteoarthritis, right knee 01/15/2017  . Osteoporosis 11/26/2016  . Acute pain of left knee 08/27/2016  . Chronic diarrhea 08/07/2016  . Left displaced femoral neck fracture ([place]) 06/26/2016  . Hypertensive heart disease   . Hydronephrosis   . Obstructive uropathy   . Abnormal echocardiogram 01/23/2016  . Pressure ulcer 01/21/2016  . AAA 01/19/2009  . ILIAC ARTERY ANEURYSM 01/19/2009  . Hyperlipidemia 01/14/2009  . TOBACCO ABUSE 01/14/2009  . Essential hypertension 01/14/2009  . Coronary atherosclerosis 01/14/2009  . Cerebrovascular disease 01/14/2009  . Peripheral vascular disease ([place]) 01/14/2009  . CHRONIC OBSTRUCTIVE PULMONARY DISEASE 01/14/2009    Patient's Medications  New Prescriptions   RIFAMPIN (RIFADIN) 300 MG CAPSULE    Take 1 capsule (300 mg total) by mouth 2 (two) times daily.  Previous Medications   ACETAMINOPHEN (TYLENOL) 325 MG TABLET    Take 2 tablets (650 mg total) by mouth every 6 (six) hours as needed for mild pain (or Fever >/= 101).   ACIDOPHILUS (RISAQUAD) CAPS CAPSULE    Take 1 capsule by mouth daily.   ASPIRIN EC 81 MG TABLET    Take 81 mg by mouth daily.   ATORVASTATIN (LIPITOR) 40 MG TABLET    Take 1 tablet (40 mg total) by mouth daily. PLEASE MAKE APPOINTMENT FOR FURTHER REFILLS   BUDESONIDE-FORMOTEROL (SYMBICORT) 160-4.5 MCG/ACT INHALER    Inhale 2 puffs into the lungs 2 (two) times daily.   CARVEDILOL (COREG) 3.125 MG TABLET    Take 1 tablet (3.125 mg total) by mouth 2 (two) times daily with a meal.   DOXYCYCLINE (VIBRA-TABS) 100 MG TABLET       FUROSEMIDE (LASIX) 20 MG TABLET    Take 1 tablet (20 mg total)  by mouth daily.   LISINOPRIL (PRINIVIL,ZESTRIL) 5 MG TABLET    Take 1 tablet (5 mg total) by mouth daily.   LISINOPRIL (PRINIVIL,ZESTRIL) 5 MG TABLET       MESALAMINE (LIALDA) 1.2 G EC TABLET    Take 2.4 g by mouth daily with breakfast.   MULTIPLE VITAMIN (MULITIVITAMIN WITH MINERALS) TABS    Take 1 tablet by mouth daily.   MUPIROCIN OINTMENT (BACTROBAN) 2 %    Apply 1 application topically 2 (two) times daily.   PANTOPRAZOLE (PROTONIX) 40 MG TABLET       POTASSIUM CHLORIDE SA (K-DUR,KLOR-CON) 20 MEQ TABLET    Take 1 tablet (20 mEq total) by mouth daily. NEED OV.   SILVER SULFADIAZINE (SILVADENE) 1 % CREAM    Apply 1 application topically daily.   TRAMADOL (ULTRAM) 50 MG TABLET    Take 1 tablet (50 mg total) by mouth every 6 (six) hours as needed.  Modified Medications   No medications on file  Discontinued Medications   No medications on file    Subjective: Ms. Ruppe is in with her daughter for her routine follow-up visit. At the time of her last visit in April we decided to have her stop rifampin and continue doxycycline home for chronic MRSA left prosthetic knee infection. She has not felt any different since that time. She continues to have somewhat loose bowel movements. She is having  no problems tolerating her doxycycline. She has chronic dislocation of her left patella and started wearing a left knee brace about 6 weeks ago. She has not noted much change in her chronic pain. About one month ago she bumped her left knee on a chair. Shortly after that she developed a small blister which has continued to drain a small amount of yellow fluid ever since that time. She has not had any fever, chills or sweats.  Review of Systems: Review of Systems  Constitutional: Negative for chills, diaphoresis and fever.  Gastrointestinal: Negative for abdominal pain, diarrhea, nausea and vomiting.  Musculoskeletal: Positive for joint pain.    Past Medical History:  Diagnosis Date  . Arthritis   .  CAD (coronary artery disease)    a. 02/2007 s/p PCI/DES ot LCX and RCA;  b. 07/2014 MV: no ischemia/infarct, EF 68%.  . Carotid arterial disease ([place])    a. 05/2015 Carotid U/S: RICA 3-53%, LICA 29-92%, RECA 4-26%, LECA >50% - overall stable, f/u 1 yr.  . Complication of anesthesia    Three days after procedure pt statrted to suffer with memoy loss that took about 4-5 days to come back according to [name]."  . Dilated cardiomyopathy ([place])    a. 05/2005 Echo: EF 65%; b. 01/22/2016 Echo: EF 25-30% (in setting of admission for urosepsis w/ elev trop); c. 01/26/2016 Echo: EF 35-40%, inf, distal apical, apical, and mid to distal septal HK-->felt to be 2/2 stress induced CM.  [name] Heart murmur   . History of bronchitis   . Hyperlipidemia   . Hypertension   . Hypertensive heart disease   . Memory impairment   . MRSA (methicillin resistant staph aureus) culture positive   . Nephrolithiasis    a. 12/2015 UVJ stone w/ hydroureteronephrosis req nephrostomy tube placement.  . Osteoarthritis    a. 02/3418 s/p L TKA complicated by infections req skin grafting.  [name] PVD (peripheral vascular disease) ([place])    a. Bilat common iliac dzs, nl ABI's --> med rx.  . Sepsis ([place])    a. 12/2015 admitted with Proteus mirabilis urosepsis/bacteremia.  . Wears glasses     Social History  Substance Use Topics  . Smoking status: Former Smoker    Packs/day: 0.50    Years: 50.00    Types: Cigarettes    Quit date: 09/28/2015  . Smokeless tobacco: Never Used     Comment: quit 09/27/15  . Alcohol use No    Family History  Problem Relation Age of Onset  . Breast cancer Mother   . Leukemia Father   . Stroke Sister   . Bone cancer Unknown     No Known Allergies  Objective: Vitals:   02/12/17 0956  BP: (!) 156/78  Pulse: 76  Temp: (!) 97.5 F (36.4 C)  TempSrc: Oral  Weight: 118 lb (53.5 kg)  Height: 5\' 1"  (1.549 m)   Body mass index is 22.3 kg/m.  Physical Exam  Constitutional:  She is in no distress.   Musculoskeletal:  She has a brace on her left knee. There is a small red papule with a probable central sinus tract. There is a small amount of yellow-brown drainage on the Band-Aid dressing that she placed this morning.    Lab Results    Problem List Items Addressed This Visit      High   Chronic infection of prosthetic knee ([place])    I'm worried that she has developed a sinus tract representing some activation of  her chronic MRSA prosthetic joint infection. I discussed options with her and will restart rifampin and continue the doxycycline. She will follow-up with me in one month.      Relevant Medications   rifampin (RIFADIN) 300 MG capsule       Michel Bickers, MD [facility] for Infectious [place] [phone] pager   [phone] cell 02/12/2017, 10:27 AM

## 2017-02-12 NOTE — Assessment & Plan Note (Signed)
I'm worried that she has developed a sinus tract representing some activation of her chronic MRSA prosthetic joint infection. I discussed options with her and will restart rifampin and continue the doxycycline. She will follow-up with me in one month.

## 2017-03-07 ENCOUNTER — Encounter: Payer: Self-pay | Admitting: Internal Medicine

## 2017-03-07 ENCOUNTER — Ambulatory Visit (INDEPENDENT_AMBULATORY_CARE_PROVIDER_SITE_OTHER): Payer: Medicare Other | Admitting: Internal Medicine

## 2017-03-07 DIAGNOSIS — T8459XD Infection and inflammatory reaction due to other internal joint prosthesis, subsequent encounter: Secondary | ICD-10-CM | POA: Diagnosis not present

## 2017-03-07 DIAGNOSIS — Z96659 Presence of unspecified artificial knee joint: Secondary | ICD-10-CM | POA: Diagnosis not present

## 2017-03-07 NOTE — Assessment & Plan Note (Signed)
She has smoldering MRSA infection of her left prosthetic knee. I feel like the lesser evil at this time is to simply have her continue doxycycline and rifampin. She is in agreement with that plan. She will follow-up with me in 3 months.

## 2017-03-07 NOTE — Progress Notes (Signed)
[name] for Infectious Disease  Patient Active Problem List   Diagnosis Date Noted  . MRSA (methicillin resistant Staphylococcus aureus) infection 06/26/2016    Priority: High  . Chronic infection of prosthetic knee ([name]) 10/12/2015    Priority: High  . Lateral dislocation of left patella 01/15/2017  . Unilateral primary osteoarthritis, right knee 01/15/2017  . Osteoporosis 11/26/2016  . Acute pain of left knee 08/27/2016  . Chronic diarrhea 08/07/2016  . Left displaced femoral neck fracture ([name]) 06/26/2016  . Hypertensive heart disease   . Hydronephrosis   . Obstructive uropathy   . Abnormal echocardiogram 01/23/2016  . Pressure ulcer 01/21/2016  . AAA 01/19/2009  . ILIAC ARTERY ANEURYSM 01/19/2009  . Hyperlipidemia 01/14/2009  . TOBACCO ABUSE 01/14/2009  . Essential hypertension 01/14/2009  . Coronary atherosclerosis 01/14/2009  . Cerebrovascular disease 01/14/2009  . Peripheral vascular disease ([name]) 01/14/2009  . CHRONIC OBSTRUCTIVE PULMONARY DISEASE 01/14/2009    Patient's Medications  New Prescriptions   No medications on file  Previous Medications   ACETAMINOPHEN (TYLENOL) 325 MG TABLET    Take 2 tablets (650 mg total) by mouth every 6 (six) hours as needed for mild pain (or Fever >/= 101).   ACIDOPHILUS (RISAQUAD) CAPS CAPSULE    Take 1 capsule by mouth daily.   ASPIRIN EC 81 MG TABLET    Take 81 mg by mouth daily.   ATORVASTATIN (LIPITOR) 40 MG TABLET    Take 1 tablet (40 mg total) by mouth daily. PLEASE MAKE APPOINTMENT FOR FURTHER REFILLS   BUDESONIDE-FORMOTEROL (SYMBICORT) 160-4.5 MCG/ACT INHALER    Inhale 2 puffs into the lungs 2 (two) times daily.   CARVEDILOL (COREG) 3.125 MG TABLET    Take 1 tablet (3.125 mg total) by mouth 2 (two) times daily with a meal.   DOXYCYCLINE (VIBRA-TABS) 100 MG TABLET       FUROSEMIDE (LASIX) 20 MG TABLET    Take 1 tablet (20 mg total) by mouth daily.   LISINOPRIL (PRINIVIL,ZESTRIL) 5 MG TABLET    Take 1  tablet (5 mg total) by mouth daily.   LISINOPRIL (PRINIVIL,ZESTRIL) 5 MG TABLET       MESALAMINE (LIALDA) 1.2 G EC TABLET    Take 2.4 g by mouth daily with breakfast.   MULTIPLE VITAMIN (MULITIVITAMIN WITH MINERALS) TABS    Take 1 tablet by mouth daily.   MUPIROCIN OINTMENT (BACTROBAN) 2 %    Apply 1 application topically 2 (two) times daily.   PANTOPRAZOLE (PROTONIX) 40 MG TABLET       POTASSIUM CHLORIDE SA (K-DUR,KLOR-CON) 20 MEQ TABLET    TAKE ONE TABLET BY MOUTH DAILY. NEED OFFICE VISIT.   RIFAMPIN (RIFADIN) 300 MG CAPSULE    Take 1 capsule (300 mg total) by mouth 2 (two) times daily.   SILVER SULFADIAZINE (SILVADENE) 1 % CREAM    Apply 1 application topically daily.   TRAMADOL (ULTRAM) 50 MG TABLET    Take 1 tablet (50 mg total) by mouth every 6 (six) hours as needed.  Modified Medications   No medications on file  Discontinued Medications   No medications on file    Subjective: [name] is in for her routine follow-up visit. She is accompanied by her son. She has been on chronic doxycycline therapy for MRSA left prosthetic knee infection. At the time of her visit one month ago she had developed a new drainage from a small sinus tract over the inferior portion of the incision  in her left knee. I had her restart rifampin at that time. She is not having any problems tolerating her antibiotics. She continues to have a scant amount of drainage each day. This morning the drainage was bloody. She continues to wear her leg brace and finds that this has been very helpful. She is having less pain. She takes regular strength acetaminophen for her pain as needed. Today she comes in using only her cane rather than her walker.   Review of Systems: Review of Systems  Constitutional: Negative for chills, diaphoresis and fever.  Gastrointestinal: Negative for abdominal pain, diarrhea, nausea and vomiting.       She has 2-3 loose bowel movements daily. This is unchanged over the past year.    Musculoskeletal: Positive for joint pain.    Past Medical History:  Diagnosis Date  . Arthritis   . CAD (coronary artery disease)    a. 02/2007 s/p PCI/DES ot LCX and RCA;  b. 07/2014 MV: no ischemia/infarct, EF 68%.  . Carotid arterial disease ([name])    a. 05/2015 Carotid U/S: RICA 7-56%, LICA 43-32%, RECA 9-51%, LECA >50% - overall stable, f/u 1 yr.  . Complication of anesthesia    Three days after procedure pt statrted to suffer with memoy loss that took about 4-5 days to come back according to pt son [name]."  . Dilated cardiomyopathy ([name])    a. 05/2005 Echo: EF 65%; b. 01/22/2016 Echo: EF 25-30% (in setting of admission for urosepsis w/ elev trop); c. 01/26/2016 Echo: EF 35-40%, inf, distal apical, apical, and mid to distal septal HK-->felt to be 2/2 stress induced CM.  [name] Heart murmur   . History of bronchitis   . Hyperlipidemia   . Hypertension   . Hypertensive heart disease   . Memory impairment   . MRSA (methicillin resistant staph aureus) culture positive   . Nephrolithiasis    a. 12/2015 UVJ stone w/ hydroureteronephrosis req nephrostomy tube placement.  . Osteoarthritis    a. 02/8415 s/p L TKA complicated by infections req skin grafting.  [name] PVD (peripheral vascular disease) ([name])    a. Bilat common iliac dzs, nl ABI's --> med rx.  . Sepsis ([name])    a. 12/2015 admitted with Proteus mirabilis urosepsis/bacteremia.  . Wears glasses     Social History  Substance Use Topics  . Smoking status: Former Smoker    Packs/day: 0.50    Years: 50.00    Types: Cigarettes    Quit date: 09/28/2015  . Smokeless tobacco: Never Used     Comment: quit 09/27/15  . Alcohol use No    Family History  Problem Relation Age of Onset  . Breast cancer Mother   . Leukemia Father   . Stroke Sister   . Bone cancer Unknown     No Known Allergies  Objective: Vitals:   03/07/17 1014  BP: (!) 171/81  Pulse: 89  Weight: 118 lb (53.5 kg)   Body mass index is 22.3 kg/m.  Physical Exam   Constitutional: She is oriented to person, place, and time.  She is in good spirits.  Musculoskeletal:  She is wearing her brace on her left knee. She has a bandaid covering the small sinus tract. There is a small amount of blood on the bandaid. There is no warmth, swelling or redness of her knee.  Neurological: She is alert and oriented to person, place, and time.  Skin: No rash noted.  Psychiatric: Mood and affect normal.    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 I feel like the lesser evil at this time is to simply have her continue doxycycline and rifampin. She is in agreement with that plan. She will follow-up with me in 3 months.          Michel Bickers, MD [facility] for Infectious [facility] [phone] pager   [phone] cell 03/07/2017, 10:29 AM

## 2017-03-15 NOTE — Telephone Encounter (Signed)
error 

## 2017-04-14 ENCOUNTER — Other Ambulatory Visit: Payer: Self-pay | Admitting: Cardiovascular Disease

## 2017-04-14 ENCOUNTER — Other Ambulatory Visit: Payer: Self-pay | Admitting: Cardiology

## 2017-04-15 NOTE — Telephone Encounter (Signed)
Rx(s) sent to pharmacy electronically.  

## 2017-04-25 ENCOUNTER — Other Ambulatory Visit: Payer: Self-pay | Admitting: Cardiology

## 2017-05-01 ENCOUNTER — Ambulatory Visit (INDEPENDENT_AMBULATORY_CARE_PROVIDER_SITE_OTHER): Payer: Medicare Other | Admitting: Physician Assistant

## 2017-05-01 VITALS — BP 138/80 | HR 69 | Ht 61.0 in | Wt 115.0 lb

## 2017-05-01 DIAGNOSIS — I10 Essential (primary) hypertension: Secondary | ICD-10-CM

## 2017-05-01 DIAGNOSIS — E785 Hyperlipidemia, unspecified: Secondary | ICD-10-CM

## 2017-05-01 DIAGNOSIS — Z79899 Other long term (current) drug therapy: Secondary | ICD-10-CM

## 2017-05-01 DIAGNOSIS — I779 Disorder of arteries and arterioles, unspecified: Secondary | ICD-10-CM

## 2017-05-01 DIAGNOSIS — I2510 Atherosclerotic heart disease of native coronary artery without angina pectoris: Secondary | ICD-10-CM | POA: Diagnosis not present

## 2017-05-01 DIAGNOSIS — I739 Peripheral vascular disease, unspecified: Secondary | ICD-10-CM

## 2017-05-01 NOTE — Patient Instructions (Signed)
Medication Instructions: Courtney Deforest, PA recommends that you continue on your current medications as directed. Please refer to the Current Medication list given to you today.  Labwork: Your physician recommends that you return for lab work with your primary care provider.  Testing/Procedures: 1. Carotid Duplex - Your physician has requested that you have a carotid duplex. This test is an ultrasound of the carotid arteries in your neck. It looks at blood flow through these arteries that supply the brain with blood. Allow one hour for this exam. There are no restrictions or special instructions.  Follow-up: [name] recommends that you schedule a follow-up appointment in 6 months with Dr [name]. You will receive a reminder letter in the mail two months in advance. If you don't receive a letter, please call our office to schedule the follow-up appointment.  If you need a refill on your cardiac medications before your next appointment, please call your pharmacy.

## 2017-05-01 NOTE — Progress Notes (Signed)
Cardiology Office Note    Date:  05/02/2017   ID:  [name], DOB 1941-05-29, MRN [id number]  PCP:  Darreld Mclean, MD  Cardiologist:  Dr. [name]   Chief Complaint  Patient presents with  . Follow-up    seen for Dr. [name].     History of Present Illness:  [name] is a 76 y.o. female with PMH of CAD, carotid artery disease, HTN, HLD, and dilated cardiomyopathy. She had history of PCI to her left circumflex and RCA with drug-eluting stents in August 2008. Abdominal ultrasound in January 2014 show moderate right and the severe left common iliac stenosis. ABI in January 2014 were normal. She was seen by Dr. Fletcher Anon and medical therapy was recommended. Myoview in January 2016 showed EF 68% with normal perfusion. Carotid ultrasound in November 2016 showed moderate disease on the left and mild disease on the right. This was unchanged on repeat doppler on 05/30/2016. Echocardiogram obtained in September 2017 showed normal LV function, grade 20, mild LAE. She had probable stress-induced cardiomyopathy in June 2017 in the setting of urosepsis. By September 2017, repeat echocardiogram showed normalization LVEF 55-60%, grade 1 DD, mild LAE. She also had GI bleed in July 2017 related to gastric AVM, this was ablated.   Patient presents today for cardiology office visit. She denies any chest discomfort or or shortness of breath. She is able to ambulate at home but due to persistent left knee issue, she has not been walking outside March. She has undergone multiple orthopedic evaluation in the past year. On physical exam, she appears to be euvolemic, she denies any lower extremity edema, orthopnea or PND. EKG today showed no sign of ischemia, PR interval 182 ms. We'll continue on current medication. She will obtain carotid ultrasound. She will need fasting lipid panel and LFT at her PCPs office. We will see her back in 6 months.   Past Medical History:  Diagnosis Date  . Arthritis   .  CAD (coronary artery disease)    a. 02/2007 s/p PCI/DES ot LCX and RCA;  b. 07/2014 MV: no ischemia/infarct, EF 68%.  . Carotid arterial disease ([place])    a. 05/2015 Carotid U/S: RICA 7-12%, LICA 19-75%, RECA 8-83%, LECA >50% - overall stable, f/u 1 yr.  . Complication of anesthesia    Three days after procedure pt statrted to suffer with memoy loss that took about 4-5 days to come back according to [name]."  . Dilated cardiomyopathy ([place])    a. 05/2005 Echo: EF 65%; b. 01/22/2016 Echo: EF 25-30% (in setting of admission for urosepsis w/ elev trop); c. 01/26/2016 Echo: EF 35-40%, inf, distal apical, apical, and mid to distal septal HK-->felt to be 2/2 stress induced CM.  [name] Heart murmur   . History of bronchitis   . Hyperlipidemia   . Hypertension   . Hypertensive heart disease   . Memory impairment   . MRSA (methicillin resistant staph aureus) culture positive   . Nephrolithiasis    a. 12/2015 UVJ stone w/ hydroureteronephrosis req nephrostomy tube placement.  . Osteoarthritis    a. 08/5496 s/p L TKA complicated by infections req skin grafting.  [name] PVD (peripheral vascular disease) ([place])    a. Bilat common iliac dzs, nl ABI's --> med rx.  . Sepsis ([place])    a. 12/2015 admitted with Proteus mirabilis urosepsis/bacteremia.  . Wears glasses     Past Surgical History:  Procedure Laterality Date  . ABDOMINAL HYSTERECTOMY    .  APPLICATION OF A-CELL OF EXTREMITY Left 12/08/2015   Procedure: APPLICATION OF A-CELL OF knee;  Surgeon: Loel Lofty [name], DO;  Location: [facility];  Service: Plastics;  Laterality: Left;  . APPLICATION OF WOUND VAC Left 11/16/2015   Procedure: APPLICATION OF WOUND VAC;  Surgeon: Loel Lofty [name], DO;  Location: [place];  Service: Plastics;  Laterality: Left;  . APPLICATION OF WOUND VAC Left 12/08/2015   Procedure: APPLICATION OF WOUND VAC  AND CAST to knee;  Surgeon: Loel Lofty [name], DO;  Location: [facility];  Service: Plastics;  Laterality: Left;  . COLONOSCOPY    .  CORONARY ANGIOPLASTY  07   3 stents placed  . ESOPHAGOGASTRODUODENOSCOPY    . EYE SURGERY Bilateral    cataracts  . FREE FLAP TO EXTREMITY Left 11/16/2015   Procedure: FREE FLAP TO EXTREMITY;  Surgeon: Loel Lofty [name], DO;  Location: [facility];  Service: Plastics;  Laterality: Left;  . I&D EXTREMITY Left 10/18/2015   Procedure: Left Knee Washout, Reclosure;  Surgeon: Newt Minion, MD;  Location: [facility];  Service: Orthopedics;  Laterality: Left;  . I&D EXTREMITY Left 12/08/2015   Procedure: IRRIGATION AND DEBRIDEMENT knee;  Surgeon: Loel Lofty [name], DO;  Location: [facility];  Service: Plastics;  Laterality: Left;  . I&D KNEE WITH POLY EXCHANGE Left 10/12/2015   Procedure: Irrigation and Debridement , Poly Exchange, Antibiotic Beads Left Knee;  Surgeon: Newt Minion, MD;  Location: [facility];  Service: Orthopedics;  Laterality: Left;  . INCISION AND DRAINAGE OF WOUND Left 01/04/2016   Procedure: DEBRIDEMENT OF LEFT KNEE WOUND WITH ACELL AND WOUND VAC PLACEMENT;  Surgeon: Loel Lofty [name], DO;  Location: [facility];  Service: Plastics;  Laterality: Left;  . IR GENERIC HISTORICAL  05/11/2016   IR FLUORO GUIDE CV MIDLINE PICC RIGHT 05/11/2016 Sandi Mariscal, MD [facility]  . IR GENERIC HISTORICAL  05/11/2016   IR US GUIDE VASC ACCESS RIGHT 05/11/2016 Sandi Mariscal, MD [facility]  . KNEE CLOSED REDUCTION Left 01/16/2017   Procedure: CLOSED MANIPULATION LEFT KNEE;  Surgeon: Leandrew Koyanagi, MD;  Location: [facility];  Service: Orthopedics;  Laterality: Left;  . KNEE SURGERY     denies  . SKIN SPLIT GRAFT Left 11/16/2015   Procedure: LEFT GASTROC MUSCLE FLAP WITH SKIN GRAFT SPLIT THICKNESS AND VAC PLACEMENT;  Surgeon: Loel Lofty [name], DO;  Location: [facility];  Service: Plastics;  Laterality: Left;  . TOTAL HIP ARTHROPLASTY Right 09/10/2012   Dr Sharol Given  . TOTAL HIP ARTHROPLASTY Right 09/10/2012   Procedure: TOTAL HIP ARTHROPLASTY;  Surgeon: Newt Minion, MD;  Location: [facility];  Service: Orthopedics;   Laterality: Right;  Right Total Hip Arthroplasty  . TOTAL HIP ARTHROPLASTY Left 06/27/2016   Procedure: LEFT TOTAL HIP ARTHROPLASTY ANTERIOR APPROACH;  Surgeon: Leandrew Koyanagi, MD;  Location: [facility];  Service: Orthopedics;  Laterality: Left;  . TOTAL KNEE ARTHROPLASTY Left 09/28/2015   Procedure: TOTAL KNEE ARTHROPLASTY;  Surgeon: Newt Minion, MD;  Location: [facility];  Service: Orthopedics;  Laterality: Left;    Current Medications: Outpatient Medications Prior to Visit  Medication Sig Dispense Refill  . acetaminophen (TYLENOL) 325 MG tablet Take 2 tablets (650 mg total) by mouth every 6 (six) hours as needed for mild pain (or Fever >/= 101).    [name] acidophilus (RISAQUAD) CAPS capsule Take 1 capsule by mouth daily.    [name] aspirin EC 81 MG tablet Take 81 mg by mouth daily.    [name]  atorvastatin (LIPITOR) 40 MG tablet Take 1 tablet (40 mg total) by mouth daily. <PLEASE MAKE APPOINTMENT FOR REFILLS> 30 tablet 0  . budesonide-formoterol (SYMBICORT) 160-4.5 MCG/ACT inhaler Inhale 2 puffs into the lungs 2 (two) times daily.    . carvedilol (COREG) 3.125 MG tablet Take 1 tablet (3.125 mg total) by mouth 2 (two) times daily with a meal. 60 tablet 11  . doxycycline (VIBRA-TABS) 100 MG tablet     . Multiple Vitamin (MULITIVITAMIN WITH MINERALS) TABS Take 1 tablet by mouth daily.    . potassium chloride SA (K-DUR,KLOR-CON) 20 MEQ tablet Take 1 tablet (20 mEq total) by mouth daily. <PLEASE MAKE APPOINTMENT FOR REFILLS> 30 tablet 0  . rifampin (RIFADIN) 300 MG capsule Take 1 capsule (300 mg total) by mouth 2 (two) times daily. 60 capsule 11  . traMADol (ULTRAM) 50 MG tablet Take 1 tablet (50 mg total) by mouth every 6 (six) hours as needed. 30 tablet 2  . lisinopril (PRINIVIL,ZESTRIL) 5 MG tablet     . furosemide (LASIX) 20 MG tablet Take 1 tablet (20 mg total) by mouth daily. (Patient not taking: Reported on 05/01/2017) 30 tablet 6  . mesalamine (LIALDA) 1.2 g EC tablet Take 2.4 g by mouth daily with breakfast.    .  lisinopril (PRINIVIL,ZESTRIL) 5 MG tablet Take 1 tablet (5 mg total) by mouth daily. 30 tablet 12  . mupirocin ointment (BACTROBAN) 2 % Apply 1 application topically 2 (two) times daily. (Patient not taking: Reported on 05/01/2017) 22 g 0  . pantoprazole (PROTONIX) 40 MG tablet     . silver sulfADIAZINE (SILVADENE) 1 % cream Apply 1 application topically daily. (Patient not taking: Reported on 05/01/2017) 50 g 0   No facility-administered medications prior to visit.      Allergies:   Patient has no known allergies.   Social History   Social History  . Marital status: Single    Spouse name: N/A  . Number of children: N/A  . Years of education: N/A   Occupational History  . Retired    Social History Main Topics  . Smoking status: Former Smoker    Packs/day: 0.50    Years: 50.00    Types: Cigarettes    Quit date: 09/28/2015  . Smokeless tobacco: Never Used     Comment: quit 09/27/15  . Alcohol use No  . Drug use: No  . Sexual activity: No   Other Topics Concern  . None   Social History Narrative   She has been living with her daughter when not in rehab since the surgery in March.     Family History:  The patient's family history includes Bone cancer in her unknown relative; Breast cancer in her mother; Leukemia in her father; Stroke in her sister.   ROS:   Please see the history of present illness.    ROS All other systems reviewed and are negative.   PHYSICAL EXAM:   VS:  BP 138/80 (BP Location: Right Arm, Patient Position: Sitting, Cuff Size: Normal)   Pulse 69   Ht 5\' 1"  (1.549 m)   Wt 115 lb (52.2 kg)   BMI 21.73 kg/m    GEN: Well nourished, well developed, in no acute distress  HEENT: normal  Neck: no JVD, carotid bruits, or masses Cardiac: RRR; no murmurs, rubs, or gallops,no edema  Respiratory:  clear to auscultation bilaterally, normal work of breathing GI: soft, nontender, nondistended, + BS MS: no deformity or atrophy  Skin: warm and dry,  no rash Neuro:   Alert and Oriented x 3, Strength and sensation are intact Psych: euthymic mood, full affect  Wt Readings from Last 3 Encounters:  05/01/17 115 lb (52.2 kg)  03/07/17 118 lb (53.5 kg)  02/12/17 118 lb (53.5 kg)      Studies/Labs Reviewed:   EKG:  EKG is ordered today.  The ekg ordered today demonstrates Normal sinus rhythm without significant ST-T wave changes.  Recent Labs: 08/27/2016: ALT 17; Platelets 297.0 01/16/2017: BUN 20; Creatinine, Ser 0.80; Hemoglobin 13.3; Potassium 4.5; Sodium 140   Lipid Panel    Component Value Date/Time   CHOL 127 01/09/2012 0934   TRIG 107.0 01/09/2012 0934   HDL 39.20 01/09/2012 0934   CHOLHDL 3 01/09/2012 0934   VLDL 21.4 01/09/2012 0934   LDLCALC 66 01/09/2012 0934    Additional studies/ records that were reviewed today include:   Echo 01/26/2016 LV EF: 35% -   40%  ------------------------------------------------------------------- Indications:      CHF - 428.0.  ------------------------------------------------------------------- History:   PMH:   Murmur.  Coronary artery disease.  Risk factors: Hypertension.  ------------------------------------------------------------------- Study Conclusions  - Study data: M-mode, limited 2D, limited spectral Doppler, and   color Doppler. - Left ventricle: The cavity size was normal. Wall thickness was   normal. Systolic function was moderately reduced. The estimated   ejection fraction was in the range of 35% to 40%. There is   inferior, distal apical, apical and mid to distal septal   hypokinesis. - Aortic valve: Calcified leaflets with restriction. There was   moderate regurgitation.  Impressions:  - Limited study. Compared to a prior echo on 01/22/2016, the LVEF   has improved to 35-40%.     Echo 04/17/2016 LV EF: 55% -   60%  Study Conclusions  - Left ventricle: The cavity size was normal. Wall thickness was   normal. Systolic function was normal. The estimated  ejection   fraction was in the range of 55% to 60%. Wall motion was normal;   there were no regional wall motion abnormalities. Doppler   parameters are consistent with abnormal left ventricular   relaxation (grade 1 diastolic dysfunction). - Aortic valve: There was mild regurgitation. - Mitral valve: Calcified annulus. Mildly thickened leaflets . - Left atrium: The atrium was mildly dilated.  Impressions:  - Normal LV systolic function; grade 1 diastolic dysfunction; mild   AI; mild LAE.    Carotid US 05/30/2016 Heterogeneous plaque, bilaterally. Stable 1-39% RICA stenosis. Stable 24-26% LICA stenosis. >50% LECA stenosis. Normal subclavian arteries, bilaterally. Patent vertebral arteries with antegrade flow.   ASSESSMENT:    1. Atherosclerosis of native coronary artery of native heart without angina pectoris   2. Carotid artery disease, unspecified laterality, unspecified type ([place])   3. Dyslipidemia   4. Medication management   5. Essential hypertension      PLAN:  In order of problems listed above:  1. CAD: No obvious chest discomfort, continue aspirin, Lipitor, carvedilol, and lisinopril   2. Carotid artery disease: Due for repeat carotid ultrasound  3. Hypertension: Blood pressure well-controlled on current medication  4. Hyperlipidemia: Due for fasting lipid panel and LFT at PCPs office    Medication Adjustments/Labs and Tests Ordered: Current medicines are reviewed at length with the patient today.  Concerns regarding medicines are outlined above.  Medication changes, Labs and Tests ordered today are listed in the Patient Instructions below. Patient Instructions  Medication Instructions: Almyra Deforest, PA recommends that you continue on your  current medications as directed. Please refer to the Current Medication list given to you today.  Labwork: Your physician recommends that you return for lab work with your primary care  provider.  Testing/Procedures: 1. Carotid Duplex - Your physician has requested that you have a carotid duplex. This test is an ultrasound of the carotid arteries in your neck. It looks at blood flow through these arteries that supply the brain with blood. Allow one hour for this exam. There are no restrictions or special instructions.  Follow-up: Isaac Laud recommends that you schedule a follow-up appointment in 6 months with Dr [name]. You will receive a reminder letter in the mail two months in advance. If you don't receive a letter, please call our office to schedule the follow-up appointment.  If you need a refill on your cardiac medications before your next appointment, please call your pharmacy.    Hilbert Corrigan, [place]  05/02/2017 11:03 PM    [address], [place], [place]  [address] Phone: [phone]; Fax: [phone]

## 2017-05-02 ENCOUNTER — Encounter: Payer: Self-pay | Admitting: Physician Assistant

## 2017-05-02 ENCOUNTER — Other Ambulatory Visit: Payer: Self-pay | Admitting: Cardiology

## 2017-05-07 ENCOUNTER — Ambulatory Visit (INDEPENDENT_AMBULATORY_CARE_PROVIDER_SITE_OTHER): Payer: Medicare Other | Admitting: Orthopaedic Surgery

## 2017-05-07 ENCOUNTER — Encounter (INDEPENDENT_AMBULATORY_CARE_PROVIDER_SITE_OTHER): Payer: Self-pay | Admitting: Orthopaedic Surgery

## 2017-05-07 ENCOUNTER — Ambulatory Visit (INDEPENDENT_AMBULATORY_CARE_PROVIDER_SITE_OTHER): Payer: Medicare Other

## 2017-05-07 DIAGNOSIS — M25562 Pain in left knee: Secondary | ICD-10-CM

## 2017-05-07 DIAGNOSIS — S83015A Lateral dislocation of left patella, initial encounter: Secondary | ICD-10-CM

## 2017-05-07 DIAGNOSIS — K529 Noninfective gastroenteritis and colitis, unspecified: Secondary | ICD-10-CM | POA: Diagnosis not present

## 2017-05-07 DIAGNOSIS — I2510 Atherosclerotic heart disease of native coronary artery without angina pectoris: Secondary | ICD-10-CM | POA: Diagnosis not present

## 2017-05-07 NOTE — Progress Notes (Signed)
Office Visit Note   Patient: [name]           Date of Birth: Feb 10, 1941           MRN: [id number] Visit Date: 05/07/2017              Requested by: Darreld Mclean, MD [address] [place], [place] [address] PCP: Darreld Mclean, MD   Assessment & Plan: Visit Diagnoses:  1. Lateral dislocation of left patella, initial encounter   2. Acute pain of left knee     Plan: X-rays show no acute findings. She has a stable lateral patella subluxation. She likely stretched her medial retinaculum this weekend when she was getting into the car. I recommend symptomatically treatment. Questions encouraged and answered. Follow-up as needed.  Follow-Up Instructions: Return if symptoms worsen or fail to improve.   Orders:  Orders Placed This Encounter  Procedures  . XR KNEE 3 VIEW LEFT   No orders of the defined types were placed in this encounter.     Procedures: No procedures performed   Clinical Data: No additional findings.   Subjective: Chief Complaint  Patient presents with  . Left Knee - Pain, Follow-up    Patient comes in today with acute onset of left knee pain since Saturday. She denies any injuries. She does endorse that her left knee was flexed while entering her car. She now complains of pain in the medial retinacular region. The pain is worse when she is getting up from a seated position. Denies any pain at rest.    Review of Systems  Constitutional: Negative.   HENT: Negative.   Eyes: Negative.   Respiratory: Negative.   Cardiovascular: Negative.   Endocrine: Negative.   Musculoskeletal: Negative.   Neurological: Negative.   Hematological: Negative.   Psychiatric/Behavioral: Negative.   All other systems reviewed and are negative.    Objective: Vital Signs: There were no vitals taken for this visit.  Physical Exam  Constitutional: She is oriented to person, place, and time. She appears well-developed and well-nourished.    Pulmonary/Chest: Effort normal.  Neurological: She is alert and oriented to person, place, and time.  Skin: Skin is warm. Capillary refill takes less than 2 seconds.  Psychiatric: She has a normal mood and affect. Her behavior is normal. Judgment and thought content normal.  Nursing note and vitals reviewed.   Ortho Exam Left knee exam is benign. She has a persistent lateral patellar dislocation Specialty Comments:  No specialty comments available.  Imaging: Xr Knee 3 View Left  Result Date: 05/07/2017 Stable total knee replacement with persistent lateral patellar dislocation    PMFS History: Patient Active Problem List   Diagnosis Date Noted  . Lateral dislocation of left patella 01/15/2017  . Unilateral primary osteoarthritis, right knee 01/15/2017  . Osteoporosis 11/26/2016  . Acute pain of left knee 08/27/2016  . Chronic diarrhea 08/07/2016  . Left displaced femoral neck fracture ([place]) 06/26/2016  . MRSA (methicillin resistant Staphylococcus aureus) infection 06/26/2016  . Hypertensive heart disease   . Hydronephrosis   . Obstructive uropathy   . Abnormal echocardiogram 01/23/2016  . Pressure ulcer 01/21/2016  . Chronic infection of prosthetic knee ([place]) 10/12/2015  . AAA 01/19/2009  . ILIAC ARTERY ANEURYSM 01/19/2009  . Hyperlipidemia 01/14/2009  . TOBACCO ABUSE 01/14/2009  . Essential hypertension 01/14/2009  . Coronary atherosclerosis 01/14/2009  . Cerebrovascular disease 01/14/2009  . Peripheral vascular disease ([place]) 01/14/2009  . CHRONIC OBSTRUCTIVE  PULMONARY DISEASE 01/14/2009   Past Medical History:  Diagnosis Date  . Arthritis   . CAD (coronary artery disease)    a. 02/2007 s/p PCI/DES ot LCX and RCA;  b. 07/2014 MV: no ischemia/infarct, EF 68%.  . Carotid arterial disease ([place])    a. 05/2015 Carotid U/S: RICA 4-48%, LICA 18-56%, RECA 3-14%, LECA >50% - overall stable, f/u 1 yr.  . Complication of anesthesia    Three days after procedure pt statrted  to suffer with memoy loss that took about 4-5 days to come back according to [name]."  . Dilated cardiomyopathy ([place])    a. 05/2005 Echo: EF 65%; b. 01/22/2016 Echo: EF 25-30% (in setting of admission for urosepsis w/ elev trop); c. 01/26/2016 Echo: EF 35-40%, inf, distal apical, apical, and mid to distal septal HK-->felt to be 2/2 stress induced CM.  [name] Heart murmur   . History of bronchitis   . Hyperlipidemia   . Hypertension   . Hypertensive heart disease   . Memory impairment   . MRSA (methicillin resistant staph aureus) culture positive   . Nephrolithiasis    a. 12/2015 UVJ stone w/ hydroureteronephrosis req nephrostomy tube placement.  . Osteoarthritis    a. 03/7025 s/p L TKA complicated by infections req skin grafting.  [name] PVD (peripheral vascular disease) ([place])    a. Bilat common iliac dzs, nl ABI's --> med rx.  . Sepsis ([place])    a. 12/2015 admitted with Proteus mirabilis urosepsis/bacteremia.  . Wears glasses     Family History  Problem Relation Age of Onset  . Breast cancer Mother   . Leukemia Father   . Stroke Sister   . Bone cancer Unknown     Past Surgical History:  Procedure Laterality Date  . ABDOMINAL HYSTERECTOMY    . APPLICATION OF A-CELL OF EXTREMITY Left 12/08/2015   Procedure: APPLICATION OF A-CELL OF knee;  Surgeon: Loel Lofty [name], DO;  Location: [facility];  Service: Plastics;  Laterality: Left;  . APPLICATION OF WOUND VAC Left 11/16/2015   Procedure: APPLICATION OF WOUND VAC;  Surgeon: Loel Lofty [name], DO;  Location: [facility];  Service: Plastics;  Laterality: Left;  . APPLICATION OF WOUND VAC Left 12/08/2015   Procedure: APPLICATION OF WOUND VAC  AND CAST to knee;  Surgeon: Loel Lofty [name], DO;  Location: [facility];  Service: Plastics;  Laterality: Left;  . COLONOSCOPY    . CORONARY ANGIOPLASTY  07   3 stents placed  . ESOPHAGOGASTRODUODENOSCOPY    . EYE SURGERY Bilateral    cataracts  . FREE FLAP TO EXTREMITY Left 11/16/2015   Procedure: FREE FLAP TO  EXTREMITY;  Surgeon: Loel Lofty [name], DO;  Location: [facility];  Service: Plastics;  Laterality: Left;  . I&D EXTREMITY Left 10/18/2015   Procedure: Left Knee Washout, Reclosure;  Surgeon: Newt Minion, MD;  Location: [facility];  Service: Orthopedics;  Laterality: Left;  . I&D EXTREMITY Left 12/08/2015   Procedure: IRRIGATION AND DEBRIDEMENT knee;  Surgeon: Loel Lofty [name], DO;  Location: [facility];  Service: Plastics;  Laterality: Left;  . I&D KNEE WITH POLY EXCHANGE Left 10/12/2015   Procedure: Irrigation and Debridement , Poly Exchange, Antibiotic Beads Left Knee;  Surgeon: Newt Minion, MD;  Location: [facility];  Service: Orthopedics;  Laterality: Left;  . INCISION AND DRAINAGE OF WOUND Left 01/04/2016   Procedure: DEBRIDEMENT OF LEFT KNEE WOUND WITH ACELL AND WOUND VAC PLACEMENT;  Surgeon: Loel Lofty [name], DO;  Location: [facility];  Service: Clinical cytogeneticist;  Laterality: Left;  . IR GENERIC HISTORICAL  05/11/2016   IR FLUORO GUIDE CV MIDLINE PICC RIGHT 05/11/2016 Sandi Mariscal, MD [facility]  . IR GENERIC HISTORICAL  05/11/2016   IR US GUIDE VASC ACCESS RIGHT 05/11/2016 Sandi Mariscal, MD [facility]  . KNEE CLOSED REDUCTION Left 01/16/2017   Procedure: CLOSED MANIPULATION LEFT KNEE;  Surgeon: Leandrew Koyanagi, MD;  Location: [facility];  Service: Orthopedics;  Laterality: Left;  . KNEE SURGERY     denies  . SKIN SPLIT GRAFT Left 11/16/2015   Procedure: LEFT GASTROC MUSCLE FLAP WITH SKIN GRAFT SPLIT THICKNESS AND VAC PLACEMENT;  Surgeon: Loel Lofty [name], DO;  Location: [facility];  Service: Plastics;  Laterality: Left;  . TOTAL HIP ARTHROPLASTY Right 09/10/2012   Dr Sharol Given  . TOTAL HIP ARTHROPLASTY Right 09/10/2012   Procedure: TOTAL HIP ARTHROPLASTY;  Surgeon: Newt Minion, MD;  Location: [facility];  Service: Orthopedics;  Laterality: Right;  Right Total Hip Arthroplasty  . TOTAL HIP ARTHROPLASTY Left 06/27/2016   Procedure: LEFT TOTAL HIP ARTHROPLASTY ANTERIOR APPROACH;  Surgeon: Leandrew Koyanagi, MD;   Location: [facility];  Service: Orthopedics;  Laterality: Left;  . TOTAL KNEE ARTHROPLASTY Left 09/28/2015   Procedure: TOTAL KNEE ARTHROPLASTY;  Surgeon: Newt Minion, MD;  Location: [facility];  Service: Orthopedics;  Laterality: Left;   Social History   Occupational History  . Retired    Social History Main Topics  . Smoking status: Former Smoker    Packs/day: 0.50    Years: 50.00    Types: Cigarettes    Quit date: 09/28/2015  . Smokeless tobacco: Never Used     Comment: quit 09/27/15  . Alcohol use No  . Drug use: No  . Sexual activity: No

## 2017-05-18 ENCOUNTER — Other Ambulatory Visit: Payer: Self-pay | Admitting: Cardiology

## 2017-05-18 ENCOUNTER — Other Ambulatory Visit: Payer: Self-pay | Admitting: Cardiovascular Disease

## 2017-05-22 ENCOUNTER — Other Ambulatory Visit: Payer: Self-pay | Admitting: Nurse Practitioner

## 2017-05-22 ENCOUNTER — Ambulatory Visit (HOSPITAL_COMMUNITY)
Admission: RE | Admit: 2017-05-22 | Discharge: 2017-05-22 | Disposition: A | Discharge status: Home / Self Care | Payer: Medicare Other | Source: Ambulatory Visit | Attending: Cardiology | Admitting: Cardiology

## 2017-05-22 DIAGNOSIS — I6523 Occlusion and stenosis of bilateral carotid arteries: Secondary | ICD-10-CM | POA: Diagnosis not present

## 2017-05-22 DIAGNOSIS — I739 Peripheral vascular disease, unspecified: Secondary | ICD-10-CM

## 2017-05-22 DIAGNOSIS — I779 Disorder of arteries and arterioles, unspecified: Secondary | ICD-10-CM | POA: Diagnosis not present

## 2017-05-24 ENCOUNTER — Other Ambulatory Visit: Payer: Self-pay | Admitting: *Deleted

## 2017-05-24 DIAGNOSIS — I6523 Occlusion and stenosis of bilateral carotid arteries: Secondary | ICD-10-CM

## 2017-05-25 NOTE — Progress Notes (Signed)
[place] at [facility] [address], [place], [place] [address] [phone]  Date:  05/30/2017   Name:  [name]   DOB:  1940-12-28   MRN:  [id number]  PCP:  Courtney Mclean, MD    Chief Complaint: Foot Swelling (c/o swelling left leg and foot. ) and Hemorrhoids (treating with preparation H)   History of Present Illness:  [name] is a 76 y.o. very pleasant female patient who presents with the following:  I last saw her in April of this year:  Last seen by myself about 4 months ago - here today for a follow-up visit.  At her last visit in January we discussed her recent health problems- Complex PMHx, here today to establishcare. She was most recently admitted from 11/28- 06/30/16 with a LEFThip fracture History of CAP s/p stents x3.  In March 2017 she had a LEFTtotal knee- got a MRSA infection and needed several revisions and follow-up procedures over the next several months. She also became septic due to an obstructing kidney stone in June and was admitted to [facility] in July with a bleeding colonic ulcer requiring blood transfusion . She then fell at home on 11/24- on follow-up in ortho clinic she was noted to have a left hip fracture. This was repaired operatively on 11/29 and she was discharged to [facility] on 12/2  She was seen by ID while inpt due to history of MRSA infection of left knee. Per Dr. Megan James she will be on oral rifampin and doxy for at least 6 months. She is currently taking both of these meds without difficutly They are seeing Dr. Megan James next month.   Here today with he daughter who contributes to the history.  Her grandson is living with her and offers support.  [name] has not started driving again but would like to get back to this.  She feels like her strength is getting better.  She last drove in February of last year so it has not been too long since she was behind the wheel.  They plan  to practice in an empty parking lot first which I agree is a good idea  She uses the walker most of the time for ambulationshe does go without it at times for short distances such as when she is in her bathroom.  She feels more secure with the walker and I encourage her to continue to use it as long as she feels like she needs it She is just on doxycycline now for her left knee- has stopped rifampin She has had a small ulcer on her right heel since her last hospital stay.  However, it seems to be doing very well and healing.  She notes that it does not hurt.  They would like for me to take a look at this today [name] notes that her appetite is very good  She is still using her walker most of the time but she is getting stronger.  She is not doing any PT at this time but would like to get back to doing this at home She is not driving yet  They are seeing ID again next week- she is on rifampin and doxycycline, probably for life.   She did dislocate her left patella back in June. wearing a knee brace now to to support her patella   She has noted swelling in her left foot. It has been swollen for a  couple of weeks. It does not hurt, but feels "awkward." the swelling was getting better at night and building up during the day. But it is now more persistent, lasting all day and night.  Never had a DVT.    She is using prep H for hemorrhoids- they are bothersome but not bleeding.   She thinks she has had these for about 2 weeks, but otherwise has not been botherd by them for years - since she was pregnant/  She declines to have me examine these today, just wanted to see if prep H was an ok treatment.   No fever Her appetite is good She is sleeping well No SOB No orthopnea  BP Readings from Last 3 Encounters:  05/30/17 110/76  05/01/17 138/80  03/07/17 (!) 171/81   Wt Readings from Last 3 Encounters:  05/30/17 118 lb 3.2 oz (53.6 kg)  05/01/17 115 lb (52.2 kg)  03/07/17 118 lb (53.5 kg)    Weight is stable  Patient Active Problem List   Diagnosis Date Noted  . Lateral dislocation of left patella 01/15/2017  . Unilateral primary osteoarthritis, right knee 01/15/2017  . Osteoporosis 11/26/2016  . Acute pain of left knee 08/27/2016  . Chronic diarrhea 08/07/2016  . Left displaced femoral neck fracture ([place]) 06/26/2016  . MRSA (methicillin resistant Staphylococcus aureus) infection 06/26/2016  . Hypertensive heart disease   . Hydronephrosis   . Obstructive uropathy   . Abnormal echocardiogram 01/23/2016  . Pressure ulcer 01/21/2016  . Chronic infection of prosthetic knee ([place]) 10/12/2015  . AAA 01/19/2009  . ILIAC ARTERY ANEURYSM 01/19/2009  . Hyperlipidemia 01/14/2009  . TOBACCO ABUSE 01/14/2009  . Essential hypertension 01/14/2009  . Coronary atherosclerosis 01/14/2009  . Cerebrovascular disease 01/14/2009  . Peripheral vascular disease ([place]) 01/14/2009  . CHRONIC OBSTRUCTIVE PULMONARY DISEASE 01/14/2009    Past Medical History:  Diagnosis Date  . Arthritis   . CAD (coronary artery disease)    a. 02/2007 s/p PCI/DES ot LCX and RCA;  b. 07/2014 MV: no ischemia/infarct, EF 68%.  . Carotid arterial disease ([place])    a. 05/2015 Carotid U/S: RICA 6-30%, LICA 16-01%, RECA 0-93%, LECA >50% - overall stable, f/u 1 yr.  . Complication of anesthesia    Three days after procedure pt statrted to suffer with memoy loss that took about 4-5 days to come back according to pt son [name]."  . Dilated cardiomyopathy ([place])    a. 05/2005 Echo: EF 65%; b. 01/22/2016 Echo: EF 25-30% (in setting of admission for urosepsis w/ elev trop); c. 01/26/2016 Echo: EF 35-40%, inf, distal apical, apical, and mid to distal septal HK-->felt to be 2/2 stress induced CM.  [name] Heart murmur   . History of bronchitis   . Hyperlipidemia   . Hypertension   . Hypertensive heart disease   . Memory impairment   . MRSA (methicillin resistant staph aureus) culture positive   . Nephrolithiasis    a. 12/2015 UVJ  stone w/ hydroureteronephrosis req nephrostomy tube placement.  . Osteoarthritis    a. 08/3555 s/p L TKA complicated by infections req skin grafting.  [name] PVD (peripheral vascular disease) ([place])    a. Bilat common iliac dzs, nl ABI's --> med rx.  . Sepsis ([place])    a. 12/2015 admitted with Proteus mirabilis urosepsis/bacteremia.  . Wears glasses     Past Surgical History:  Procedure Laterality Date  . ABDOMINAL HYSTERECTOMY    . APPLICATION OF A-CELL OF EXTREMITY Left 12/08/2015   Procedure: APPLICATION OF  A-CELL OF knee;  Surgeon: Loel Lofty [name], DO;  Location: [place];  Service: Plastics;  Laterality: Left;  . APPLICATION OF WOUND VAC Left 11/16/2015   Procedure: APPLICATION OF WOUND VAC;  Surgeon: Loel Lofty [name], DO;  Location: [place];  Service: Plastics;  Laterality: Left;  . APPLICATION OF WOUND VAC Left 12/08/2015   Procedure: APPLICATION OF WOUND VAC  AND CAST to knee;  Surgeon: Loel Lofty [name], DO;  Location: [place];  Service: Plastics;  Laterality: Left;  . COLONOSCOPY    . CORONARY ANGIOPLASTY  07   3 stents placed  . ESOPHAGOGASTRODUODENOSCOPY    . EYE SURGERY Bilateral    cataracts  . FREE FLAP TO EXTREMITY Left 11/16/2015   Procedure: FREE FLAP TO EXTREMITY;  Surgeon: Loel Lofty [name], DO;  Location: [place];  Service: Plastics;  Laterality: Left;  . I&D EXTREMITY Left 10/18/2015   Procedure: Left Knee Washout, Reclosure;  Surgeon: Newt Minion, MD;  Location: [place];  Service: Orthopedics;  Laterality: Left;  . I&D EXTREMITY Left 12/08/2015   Procedure: IRRIGATION AND DEBRIDEMENT knee;  Surgeon: Loel Lofty [name], DO;  Location: [place];  Service: Plastics;  Laterality: Left;  . I&D KNEE WITH POLY EXCHANGE Left 10/12/2015   Procedure: Irrigation and Debridement , Poly Exchange, Antibiotic Beads Left Knee;  Surgeon: Newt Minion, MD;  Location: [place];  Service: Orthopedics;  Laterality: Left;  . INCISION AND DRAINAGE OF WOUND Left 01/04/2016   Procedure: DEBRIDEMENT OF  LEFT KNEE WOUND WITH ACELL AND WOUND VAC PLACEMENT;  Surgeon: Loel Lofty [name], DO;  Location: [place];  Service: Plastics;  Laterality: Left;  . IR GENERIC HISTORICAL  05/11/2016   IR FLUORO GUIDE CV MIDLINE PICC RIGHT 05/11/2016 Sandi Mariscal, MD [facility]  . IR GENERIC HISTORICAL  05/11/2016   IR US GUIDE VASC ACCESS RIGHT 05/11/2016 Sandi Mariscal, MD [facility]  . KNEE CLOSED REDUCTION Left 01/16/2017   Procedure: CLOSED MANIPULATION LEFT KNEE;  Surgeon: Leandrew Koyanagi, MD;  Location: [place];  Service: Orthopedics;  Laterality: Left;  . KNEE SURGERY     denies  . SKIN SPLIT GRAFT Left 11/16/2015   Procedure: LEFT GASTROC MUSCLE FLAP WITH SKIN GRAFT SPLIT THICKNESS AND VAC PLACEMENT;  Surgeon: Loel Lofty [name], DO;  Location: [place];  Service: Plastics;  Laterality: Left;  . TOTAL HIP ARTHROPLASTY Right 09/10/2012   Dr Sharol Given  . TOTAL HIP ARTHROPLASTY Right 09/10/2012   Procedure: TOTAL HIP ARTHROPLASTY;  Surgeon: Newt Minion, MD;  Location: [place];  Service: Orthopedics;  Laterality: Right;  Right Total Hip Arthroplasty  . TOTAL HIP ARTHROPLASTY Left 06/27/2016   Procedure: LEFT TOTAL HIP ARTHROPLASTY ANTERIOR APPROACH;  Surgeon: Leandrew Koyanagi, MD;  Location: [place];  Service: Orthopedics;  Laterality: Left;  . TOTAL KNEE ARTHROPLASTY Left 09/28/2015   Procedure: TOTAL KNEE ARTHROPLASTY;  Surgeon: Newt Minion, MD;  Location: [place];  Service: Orthopedics;  Laterality: Left;    Social History  Substance Use Topics  . Smoking status: Former Smoker    Packs/day: 0.50    Years: 50.00    Types: Cigarettes    Quit date: 09/28/2015  . Smokeless tobacco: Never Used     Comment: quit 09/27/15  . Alcohol use No    Family History  Problem Relation Age of Onset  . Breast cancer Mother   . Leukemia Father   . Stroke Sister   . Bone cancer Unknown  No Known Allergies  Medication list has been reviewed and updated.  Current Outpatient Prescriptions on File Prior to  Visit  Medication Sig Dispense Refill  . acetaminophen (TYLENOL) 325 MG tablet Take 2 tablets (650 mg total) by mouth every 6 (six) hours as needed for mild pain (or Fever >/= 101).    [name] acidophilus (RISAQUAD) CAPS capsule Take 1 capsule by mouth daily.    [name] aspirin EC 81 MG tablet Take 81 mg by mouth daily.    [name] atorvastatin (LIPITOR) 40 MG tablet TAKE ONE TABLET BY MOUTH DAILY   PLEASE MAKE AN APPOINTMENT 30 tablet 9  . carvedilol (COREG) 3.125 MG tablet TAKE ONE TABLET BY MOUTH TWICE A DAY WITH A MEAL 180 tablet 10  . doxycycline (VIBRA-TABS) 100 MG tablet     . furosemide (LASIX) 20 MG tablet Take 1 tablet (20 mg total) by mouth daily. 30 tablet 6  . lisinopril (PRINIVIL,ZESTRIL) 5 MG tablet TAKE ONE TABLET BY MOUTH DAILY 30 tablet 6  . Multiple Vitamin (MULITIVITAMIN WITH MINERALS) TABS Take 1 tablet by mouth daily.    . potassium chloride SA (K-DUR,KLOR-CON) 20 MEQ tablet TAKE ONE TABLET BY MOUTH DAILY   PLEASE MAKE AN APPOINTMENT FOR REFILLS 30 tablet 9  . rifampin (RIFADIN) 300 MG capsule Take 1 capsule (300 mg total) by mouth 2 (two) times daily. 60 capsule 11  . traMADol (ULTRAM) 50 MG tablet Take 1 tablet (50 mg total) by mouth every 6 (six) hours as needed. 30 tablet 2  . mesalamine (LIALDA) 1.2 g EC tablet Take 2.4 g by mouth daily with breakfast.     No current facility-administered medications on file prior to visit.     Review of Systems:  As per HPI- otherwise negative.   Physical Examination: Vitals:   05/30/17 0900  BP: 110/76  Pulse: 69  Temp: 97.6 F (36.4 C)  SpO2: 96%   Vitals:   05/30/17 0900  Weight: 118 lb 3.2 oz (53.6 kg)   Body mass index is 22.33 kg/m. Ideal Body Weight:    GEN: WDWN, NAD, Non-toxic, A & O x 3, petite build, looks well HEENT: Atraumatic, Normocephalic. Neck supple. No masses, No LAD. Ears and Nose: No external deformity. CV: RRR, No M/G/R. No JVD. No thrill. No extra heart sounds. PULM: CTA B, no wheezes, crackles, rhonchi. No  retractions. No resp. distress. No accessory muscle use. ABD: S, NT, ND, +BS. No rebound. No HSM.  Benign belly EXTR: No c/c/e NEURO slow gait, uses a walker  PSYCH: Normally interactive. Conversant. Not depressed or anxious appearing.  Calm demeanor.  Left leg: she is wearing a hinged knee brace The left knee displays scarring and the remnants of her chronic wound, but does not appear to be actively infected. She does not have full ROM chronically but does not have pain with ROM She has 1+ swelling of the left lower extremity only, from the foot to the upper shin.  No skin breakdown, heat or redness is noted   Assessment and Plan: Left leg swelling - Plan: US Venous Img Lower Unilateral Left  Immunization due - Plan: Pneumococcal polysaccharide vaccine 23-valent greater than or equal to 2yo subcutaneous/IM, Flu vaccine HIGH DOSE PF (Fluzone High dose)  Strength loss of - Plan: Ambulatory referral to Home Health  Here today with left leg swelling. Suspect venous insuf but do need to rule out DVT.  Will obtain US today Updated immunizations today Referral to home PT to work on  her strength and balance   Signed Lamar Blinks, MD   Received her [place] and gave her a call. No DVT.  They will try using compression socks or stockings for her and will let me know if not helpful   US Venous Img Lower Unilateral Left  Result Date: 05/30/2017 CLINICAL DATA:  Left lower extremity pain and edema for the past year. History of left knee and hip surgeries. Evaluate for DVT. EXAM: LEFT LOWER EXTREMITY VENOUS DOPPLER ULTRASOUND TECHNIQUE: Gray-scale sonography with graded compression, as well as color Doppler and duplex ultrasound were performed to evaluate the lower extremity deep venous systems from the level of the common femoral vein and including the common femoral, femoral, profunda femoral, popliteal and calf veins including the posterior tibial, peroneal and gastrocnemius veins when visible. The  superficial great saphenous vein was also interrogated. Spectral Doppler was utilized to evaluate flow at rest and with distal augmentation maneuvers in the common femoral, femoral and popliteal veins. COMPARISON:  None. FINDINGS: Contralateral Common Femoral Vein: Respiratory phasicity is normal and symmetric with the symptomatic side. No evidence of thrombus. Normal compressibility. Common Femoral Vein: No evidence of thrombus. Normal compressibility, respiratory phasicity and response to augmentation. Saphenofemoral Junction: No evidence of thrombus. Normal compressibility and flow on color Doppler imaging. Profunda Femoral Vein: No evidence of thrombus. Normal compressibility and flow on color Doppler imaging. Femoral Vein: No evidence of thrombus. Normal compressibility, respiratory phasicity and response to augmentation. Popliteal Vein: No evidence of thrombus. Normal compressibility, respiratory phasicity and response to augmentation. Calf Veins: No evidence of thrombus. Normal compressibility and flow on color Doppler imaging. Superficial Great Saphenous Vein: No evidence of thrombus. Normal compressibility. Venous Reflux:  None. Other Findings: There is a minimal subcutaneous edema at the level of the left ankle. IMPRESSION: No evidence of DVT within the left lower extremity. Electronically Signed   By: Sandi Mariscal M.D.   On: 05/30/2017 11:36   Xr Knee 3 View Left  Result Date: 05/07/2017 Stable total knee replacement with persistent lateral patellar dislocation

## 2017-05-29 ENCOUNTER — Other Ambulatory Visit: Payer: Self-pay | Admitting: Cardiology

## 2017-05-29 NOTE — Telephone Encounter (Signed)
Rx(s) sent to pharmacy electronically.  

## 2017-05-30 ENCOUNTER — Ambulatory Visit (INDEPENDENT_AMBULATORY_CARE_PROVIDER_SITE_OTHER): Payer: Medicare Other | Admitting: Family Medicine

## 2017-05-30 ENCOUNTER — Ambulatory Visit (HOSPITAL_BASED_OUTPATIENT_CLINIC_OR_DEPARTMENT_OTHER)
Admission: RE | Admit: 2017-05-30 | Discharge: 2017-05-30 | Disposition: A | Discharge status: Home / Self Care | Payer: Medicare Other | Source: Ambulatory Visit | Attending: Family Medicine | Admitting: Family Medicine

## 2017-05-30 VITALS — BP 110/76 | HR 69 | Temp 97.6°F | Wt 118.2 lb

## 2017-05-30 DIAGNOSIS — Z23 Encounter for immunization: Secondary | ICD-10-CM | POA: Diagnosis not present

## 2017-05-30 DIAGNOSIS — I6523 Occlusion and stenosis of bilateral carotid arteries: Secondary | ICD-10-CM | POA: Diagnosis not present

## 2017-05-30 DIAGNOSIS — R600 Localized edema: Secondary | ICD-10-CM | POA: Diagnosis not present

## 2017-05-30 DIAGNOSIS — M7989 Other specified soft tissue disorders: Secondary | ICD-10-CM

## 2017-05-30 DIAGNOSIS — R531 Weakness: Secondary | ICD-10-CM | POA: Diagnosis not present

## 2017-05-30 NOTE — Patient Instructions (Signed)
Please have your ultrasound done at 10:30, and I will call with the result  Assuming that you do not have a clot, let's try compression socks to help keep fluid out of your leg

## 2017-06-08 DIAGNOSIS — I420 Dilated cardiomyopathy: Secondary | ICD-10-CM | POA: Diagnosis not present

## 2017-06-08 DIAGNOSIS — I739 Peripheral vascular disease, unspecified: Secondary | ICD-10-CM | POA: Diagnosis not present

## 2017-06-08 DIAGNOSIS — M6281 Muscle weakness (generalized): Secondary | ICD-10-CM | POA: Diagnosis not present

## 2017-06-08 DIAGNOSIS — E785 Hyperlipidemia, unspecified: Secondary | ICD-10-CM | POA: Diagnosis not present

## 2017-06-08 DIAGNOSIS — I10 Essential (primary) hypertension: Secondary | ICD-10-CM | POA: Diagnosis not present

## 2017-06-08 DIAGNOSIS — M810 Age-related osteoporosis without current pathological fracture: Secondary | ICD-10-CM | POA: Diagnosis not present

## 2017-06-08 DIAGNOSIS — M1711 Unilateral primary osteoarthritis, right knee: Secondary | ICD-10-CM | POA: Diagnosis not present

## 2017-06-08 DIAGNOSIS — Z955 Presence of coronary angioplasty implant and graft: Secondary | ICD-10-CM | POA: Diagnosis not present

## 2017-06-08 DIAGNOSIS — I2510 Atherosclerotic heart disease of native coronary artery without angina pectoris: Secondary | ICD-10-CM | POA: Diagnosis not present

## 2017-06-08 DIAGNOSIS — M19011 Primary osteoarthritis, right shoulder: Secondary | ICD-10-CM | POA: Diagnosis not present

## 2017-06-08 DIAGNOSIS — L89622 Pressure ulcer of left heel, stage 2: Secondary | ICD-10-CM | POA: Diagnosis not present

## 2017-06-08 DIAGNOSIS — T8454XD Infection and inflammatory reaction due to internal left knee prosthesis, subsequent encounter: Secondary | ICD-10-CM | POA: Diagnosis not present

## 2017-06-08 DIAGNOSIS — J449 Chronic obstructive pulmonary disease, unspecified: Secondary | ICD-10-CM | POA: Diagnosis not present

## 2017-06-08 DIAGNOSIS — Z87891 Personal history of nicotine dependence: Secondary | ICD-10-CM | POA: Diagnosis not present

## 2017-06-11 ENCOUNTER — Ambulatory Visit: Payer: Medicare Other | Admitting: Internal Medicine

## 2017-06-12 DIAGNOSIS — M1711 Unilateral primary osteoarthritis, right knee: Secondary | ICD-10-CM | POA: Diagnosis not present

## 2017-06-12 DIAGNOSIS — I420 Dilated cardiomyopathy: Secondary | ICD-10-CM | POA: Diagnosis not present

## 2017-06-12 DIAGNOSIS — L89622 Pressure ulcer of left heel, stage 2: Secondary | ICD-10-CM | POA: Diagnosis not present

## 2017-06-12 DIAGNOSIS — M19011 Primary osteoarthritis, right shoulder: Secondary | ICD-10-CM | POA: Diagnosis not present

## 2017-06-12 DIAGNOSIS — I2510 Atherosclerotic heart disease of native coronary artery without angina pectoris: Secondary | ICD-10-CM | POA: Diagnosis not present

## 2017-06-12 DIAGNOSIS — T8454XD Infection and inflammatory reaction due to internal left knee prosthesis, subsequent encounter: Secondary | ICD-10-CM | POA: Diagnosis not present

## 2017-06-14 DIAGNOSIS — M1711 Unilateral primary osteoarthritis, right knee: Secondary | ICD-10-CM | POA: Diagnosis not present

## 2017-06-14 DIAGNOSIS — L89622 Pressure ulcer of left heel, stage 2: Secondary | ICD-10-CM | POA: Diagnosis not present

## 2017-06-14 DIAGNOSIS — T8454XD Infection and inflammatory reaction due to internal left knee prosthesis, subsequent encounter: Secondary | ICD-10-CM | POA: Diagnosis not present

## 2017-06-14 DIAGNOSIS — I2510 Atherosclerotic heart disease of native coronary artery without angina pectoris: Secondary | ICD-10-CM | POA: Diagnosis not present

## 2017-06-14 DIAGNOSIS — I420 Dilated cardiomyopathy: Secondary | ICD-10-CM | POA: Diagnosis not present

## 2017-06-14 DIAGNOSIS — M19011 Primary osteoarthritis, right shoulder: Secondary | ICD-10-CM | POA: Diagnosis not present

## 2017-06-15 ENCOUNTER — Other Ambulatory Visit: Payer: Self-pay | Admitting: Cardiology

## 2017-06-18 ENCOUNTER — Telehealth: Payer: Self-pay | Admitting: *Deleted

## 2017-06-18 DIAGNOSIS — L89622 Pressure ulcer of left heel, stage 2: Secondary | ICD-10-CM | POA: Diagnosis not present

## 2017-06-18 DIAGNOSIS — M19011 Primary osteoarthritis, right shoulder: Secondary | ICD-10-CM | POA: Diagnosis not present

## 2017-06-18 DIAGNOSIS — I420 Dilated cardiomyopathy: Secondary | ICD-10-CM | POA: Diagnosis not present

## 2017-06-18 DIAGNOSIS — T8454XD Infection and inflammatory reaction due to internal left knee prosthesis, subsequent encounter: Secondary | ICD-10-CM | POA: Diagnosis not present

## 2017-06-18 DIAGNOSIS — I2510 Atherosclerotic heart disease of native coronary artery without angina pectoris: Secondary | ICD-10-CM | POA: Diagnosis not present

## 2017-06-18 DIAGNOSIS — M1711 Unilateral primary osteoarthritis, right knee: Secondary | ICD-10-CM | POA: Diagnosis not present

## 2017-06-18 NOTE — Telephone Encounter (Signed)
Received Physician Orders from [facility]; forwarded to provider/SLS 11/20  

## 2017-06-21 DIAGNOSIS — M19011 Primary osteoarthritis, right shoulder: Secondary | ICD-10-CM | POA: Diagnosis not present

## 2017-06-21 DIAGNOSIS — I420 Dilated cardiomyopathy: Secondary | ICD-10-CM | POA: Diagnosis not present

## 2017-06-21 DIAGNOSIS — T8454XD Infection and inflammatory reaction due to internal left knee prosthesis, subsequent encounter: Secondary | ICD-10-CM | POA: Diagnosis not present

## 2017-06-21 DIAGNOSIS — M1711 Unilateral primary osteoarthritis, right knee: Secondary | ICD-10-CM | POA: Diagnosis not present

## 2017-06-21 DIAGNOSIS — L89622 Pressure ulcer of left heel, stage 2: Secondary | ICD-10-CM | POA: Diagnosis not present

## 2017-06-21 DIAGNOSIS — I2510 Atherosclerotic heart disease of native coronary artery without angina pectoris: Secondary | ICD-10-CM | POA: Diagnosis not present

## 2017-06-25 DIAGNOSIS — L89622 Pressure ulcer of left heel, stage 2: Secondary | ICD-10-CM | POA: Diagnosis not present

## 2017-06-25 DIAGNOSIS — T8454XD Infection and inflammatory reaction due to internal left knee prosthesis, subsequent encounter: Secondary | ICD-10-CM | POA: Diagnosis not present

## 2017-06-25 DIAGNOSIS — M19011 Primary osteoarthritis, right shoulder: Secondary | ICD-10-CM | POA: Diagnosis not present

## 2017-06-25 DIAGNOSIS — I2510 Atherosclerotic heart disease of native coronary artery without angina pectoris: Secondary | ICD-10-CM | POA: Diagnosis not present

## 2017-06-25 DIAGNOSIS — I420 Dilated cardiomyopathy: Secondary | ICD-10-CM | POA: Diagnosis not present

## 2017-06-25 DIAGNOSIS — M1711 Unilateral primary osteoarthritis, right knee: Secondary | ICD-10-CM | POA: Diagnosis not present

## 2017-06-26 ENCOUNTER — Encounter: Payer: Self-pay | Admitting: Internal Medicine

## 2017-06-26 ENCOUNTER — Ambulatory Visit (INDEPENDENT_AMBULATORY_CARE_PROVIDER_SITE_OTHER): Payer: Medicare Other | Admitting: Internal Medicine

## 2017-06-26 DIAGNOSIS — T8459XD Infection and inflammatory reaction due to other internal joint prosthesis, subsequent encounter: Secondary | ICD-10-CM | POA: Diagnosis present

## 2017-06-26 DIAGNOSIS — Z96659 Presence of unspecified artificial knee joint: Secondary | ICD-10-CM | POA: Diagnosis not present

## 2017-06-26 DIAGNOSIS — I6523 Occlusion and stenosis of bilateral carotid arteries: Secondary | ICD-10-CM | POA: Diagnosis not present

## 2017-06-26 MED ORDER — RIFAMPIN 300 MG PO CAPS: 300.0000 mg | ORAL_CAPSULE | Freq: Two times a day (BID) | ORAL | 11 refills | Status: DC

## 2017-06-26 MED ORDER — DOXYCYCLINE HYCLATE 100 MG PO TABS: 100.0000 mg | ORAL_TABLET | Freq: Two times a day (BID) | ORAL | 11 refills | Status: DC

## 2017-06-26 NOTE — Assessment & Plan Note (Signed)
She has improved since restarting rifampin along with her chronic doxycycline therapy. Her draining sinus has closed. She is tolerating her antibiotics and will continue them indefinitely for now. She will follow-up in 6 months. She knows to call right away if she develops diarrhea or other possible side effects of her antibiotics.

## 2017-06-26 NOTE — Progress Notes (Signed)
[name] for Infectious Disease  Patient Active Problem List   Diagnosis Date Noted  . MRSA (methicillin resistant Staphylococcus aureus) infection 06/26/2016    Priority: High  . Chronic infection of prosthetic knee ([place]) 10/12/2015    Priority: High  . Lateral dislocation of left patella 01/15/2017  . Unilateral primary osteoarthritis, right knee 01/15/2017  . Osteoporosis 11/26/2016  . Acute pain of left knee 08/27/2016  . Chronic diarrhea 08/07/2016  . Left displaced femoral neck fracture ([place]) 06/26/2016  . Hypertensive heart disease   . Hydronephrosis   . Obstructive uropathy   . Abnormal echocardiogram 01/23/2016  . Pressure ulcer 01/21/2016  . AAA 01/19/2009  . ILIAC ARTERY ANEURYSM 01/19/2009  . Hyperlipidemia 01/14/2009  . TOBACCO ABUSE 01/14/2009  . Essential hypertension 01/14/2009  . Coronary atherosclerosis 01/14/2009  . Cerebrovascular disease 01/14/2009  . Peripheral vascular disease ([place]) 01/14/2009  . CHRONIC OBSTRUCTIVE PULMONARY DISEASE 01/14/2009      Medication List        Accurate as of 06/26/17  2:06 PM. Always use your most recent med list.          acetaminophen 325 MG tablet Commonly known as:  TYLENOL Take 2 tablets (650 mg total) by mouth every 6 (six) hours as needed for mild pain (or Fever >/= 101).   aspirin EC 81 MG tablet   atorvastatin 40 MG tablet Commonly known as:  LIPITOR TAKE ONE TABLET BY MOUTH DAILY   PLEASE MAKE AN APPOINTMENT   carvedilol 3.125 MG tablet Commonly known as:  COREG TAKE ONE TABLET BY MOUTH TWICE A DAY WITH A MEAL   doxycycline 100 MG tablet Commonly known as:  VIBRA-TABS Take 1 tablet (100 mg total) by mouth 2 (two) times daily.   furosemide 20 MG tablet Commonly known as:  LASIX TAKE ONE TABLET BY MOUTH DAILY   lisinopril 5 MG tablet Commonly known as:  PRINIVIL,ZESTRIL TAKE ONE TABLET BY MOUTH DAILY   magnesium oxide 400 MG tablet Commonly known as:  MAG-OX     mesalamine 1.2 g EC tablet Commonly known as:  LIALDA   multivitamin with minerals Tabs tablet   naproxen sodium 220 MG tablet Commonly known as:  ALEVE   potassium chloride SA 20 MEQ tablet Commonly known as:  K-DUR,KLOR-CON TAKE ONE TABLET BY MOUTH DAILY   PLEASE MAKE AN APPOINTMENT FOR REFILLS   PROBIOTIC DAILY PO   rifampin 300 MG capsule Commonly known as:  RIFADIN Take 1 capsule (300 mg total) by mouth 2 (two) times daily.   traMADol 50 MG tablet Commonly known as:  ULTRAM Take 1 tablet (50 mg total) by mouth every 6 (six) hours as needed.       Where to Get Your Medications    These medications were sent to [facility] at [place], [place]  [address] [place], [place] [address]   Phone:  [phone]   doxycycline 100 MG tablet  rifampin 300 MG capsule     Subjective: Ms [name] is in for her routine follow-up visit. She is accompanied by her son and daughter. She continues to take doxycycline and rifampin as chronic suppressive therapy for her MRSA left prosthetic knee infection. She notes that her bowel movements are more frequent at normal and she is not having diarrhea. She seems to be tolerating her antibiotics well. She has had no change in her left knee pain.  She takes Aleve on a daily basis for pain. Shortly after her last visit the draining sinus over her left knee closed. She has not had any drainage for the past month.  Review of Systems: Review of Systems  Constitutional: Negative for chills and fever.  Gastrointestinal: Negative for abdominal pain, diarrhea, nausea and vomiting.  Musculoskeletal: Positive for joint pain.    Past Medical History:  Diagnosis Date  . Arthritis   . CAD (coronary artery disease)    a. 02/2007 s/p PCI/DES ot LCX and RCA;  b. 07/2014 MV: no ischemia/infarct, EF 68%.  . Carotid arterial disease ([place])    a. 05/2015 Carotid U/S: RICA 8-33%, LICA 82-50%, RECA 5-39%, LECA  >50% - overall stable, f/u 1 yr.  . Complication of anesthesia    Three days after procedure pt statrted to suffer with memoy loss that took about 4-5 days to come back according to pt son [name]."  . Dilated cardiomyopathy ([place])    a. 05/2005 Echo: EF 65%; b. 01/22/2016 Echo: EF 25-30% (in setting of admission for urosepsis w/ elev trop); c. 01/26/2016 Echo: EF 35-40%, inf, distal apical, apical, and mid to distal septal HK-->felt to be 2/2 stress induced CM.  [name] Heart murmur   . History of bronchitis   . Hyperlipidemia   . Hypertension   . Hypertensive heart disease   . Memory impairment   . MRSA (methicillin resistant staph aureus) culture positive   . Nephrolithiasis    a. 12/2015 UVJ stone w/ hydroureteronephrosis req nephrostomy tube placement.  . Osteoarthritis    a. 01/6733 s/p L TKA complicated by infections req skin grafting.  [name] PVD (peripheral vascular disease) ([place])    a. Bilat common iliac dzs, nl ABI's --> med rx.  . Sepsis ([place])    a. 12/2015 admitted with Proteus mirabilis urosepsis/bacteremia.  . Wears glasses     Social History   Tobacco Use  . Smoking status: Former Smoker    Packs/day: 0.50    Years: 50.00    Pack years: 25.00    Types: Cigarettes    Last attempt to quit: 09/28/2015    Years since quitting: 1.7  . Smokeless tobacco: Never Used  . Tobacco comment: quit 09/27/15  Substance Use Topics  . Alcohol use: No  . Drug use: No    Family History  Problem Relation Age of Onset  . Breast cancer Mother   . Leukemia Father   . Stroke Sister   . Bone cancer Unknown     No Known Allergies  Objective: Vitals:   06/26/17 1342  BP: (!) 186/84  Pulse: 84  Temp: 97.6 F (36.4 C)  TempSrc: Oral  Weight: 118 lb (53.5 kg)  Height: 5\' 2"  (1.575 m)   Body mass index is 21.58 kg/m.  Physical Exam  Constitutional: She is oriented to person, place, and time. No distress.  Musculoskeletal:  She is wearing a brace on her left knee. Her incision is healed.  She no longer has any draining sinus. There is no unusual erythema, swelling or warmth.  Neurological: She is alert and oriented to person, place, and time.  Skin: No rash noted.  Psychiatric: Mood and affect normal.    Lab Results    Problem List Items Addressed This Visit      High   Chronic infection of prosthetic knee ([place])    She has improved since restarting rifampin along with her chronic doxycycline therapy. Her draining sinus has closed. She is tolerating  her antibiotics and will continue them indefinitely for now. She will follow-up in 6 months. She knows to call right away if she develops diarrhea or other possible side effects of her antibiotics.      Relevant Medications   rifampin (RIFADIN) 300 MG capsule       Michel Bickers, MD [facility] [place] [phone] pager   [phone] cell 06/26/2017, 2:06 PM

## 2017-06-27 DIAGNOSIS — I420 Dilated cardiomyopathy: Secondary | ICD-10-CM | POA: Diagnosis not present

## 2017-06-27 DIAGNOSIS — M1711 Unilateral primary osteoarthritis, right knee: Secondary | ICD-10-CM | POA: Diagnosis not present

## 2017-06-27 DIAGNOSIS — I2510 Atherosclerotic heart disease of native coronary artery without angina pectoris: Secondary | ICD-10-CM | POA: Diagnosis not present

## 2017-06-27 DIAGNOSIS — L89622 Pressure ulcer of left heel, stage 2: Secondary | ICD-10-CM | POA: Diagnosis not present

## 2017-06-27 DIAGNOSIS — T8454XD Infection and inflammatory reaction due to internal left knee prosthesis, subsequent encounter: Secondary | ICD-10-CM | POA: Diagnosis not present

## 2017-06-27 DIAGNOSIS — M19011 Primary osteoarthritis, right shoulder: Secondary | ICD-10-CM | POA: Diagnosis not present

## 2017-07-02 DIAGNOSIS — M1711 Unilateral primary osteoarthritis, right knee: Secondary | ICD-10-CM | POA: Diagnosis not present

## 2017-07-02 DIAGNOSIS — I2510 Atherosclerotic heart disease of native coronary artery without angina pectoris: Secondary | ICD-10-CM | POA: Diagnosis not present

## 2017-07-02 DIAGNOSIS — M19011 Primary osteoarthritis, right shoulder: Secondary | ICD-10-CM | POA: Diagnosis not present

## 2017-07-02 DIAGNOSIS — T8454XD Infection and inflammatory reaction due to internal left knee prosthesis, subsequent encounter: Secondary | ICD-10-CM | POA: Diagnosis not present

## 2017-07-02 DIAGNOSIS — L89622 Pressure ulcer of left heel, stage 2: Secondary | ICD-10-CM | POA: Diagnosis not present

## 2017-07-02 DIAGNOSIS — I420 Dilated cardiomyopathy: Secondary | ICD-10-CM | POA: Diagnosis not present

## 2017-07-04 ENCOUNTER — Telehealth (INDEPENDENT_AMBULATORY_CARE_PROVIDER_SITE_OTHER): Payer: Self-pay | Admitting: Orthopaedic Surgery

## 2017-07-04 NOTE — Telephone Encounter (Signed)
Has appt made

## 2017-07-04 NOTE — Telephone Encounter (Signed)
Sure that's fine.  Whenever she wants to come in

## 2017-07-04 NOTE — Telephone Encounter (Signed)
Her right knee was injected ---08/07/16

## 2017-07-04 NOTE — Telephone Encounter (Signed)
Patient called wanting to get another injection in her right knee.  She was not sure of when the last one was.  CB#[phone].  Thank you.

## 2017-07-05 ENCOUNTER — Telehealth: Payer: Self-pay

## 2017-07-05 DIAGNOSIS — I2510 Atherosclerotic heart disease of native coronary artery without angina pectoris: Secondary | ICD-10-CM | POA: Diagnosis not present

## 2017-07-05 DIAGNOSIS — L89622 Pressure ulcer of left heel, stage 2: Secondary | ICD-10-CM | POA: Diagnosis not present

## 2017-07-05 DIAGNOSIS — I420 Dilated cardiomyopathy: Secondary | ICD-10-CM | POA: Diagnosis not present

## 2017-07-05 DIAGNOSIS — M19011 Primary osteoarthritis, right shoulder: Secondary | ICD-10-CM | POA: Diagnosis not present

## 2017-07-05 DIAGNOSIS — T8454XD Infection and inflammatory reaction due to internal left knee prosthesis, subsequent encounter: Secondary | ICD-10-CM | POA: Diagnosis not present

## 2017-07-05 DIAGNOSIS — M1711 Unilateral primary osteoarthritis, right knee: Secondary | ICD-10-CM | POA: Diagnosis not present

## 2017-07-05 NOTE — Telephone Encounter (Signed)
BP Readings from Last 3 Encounters:  06/26/17 (!) 186/84  05/30/17 110/76  05/01/17 138/80  called pt trying to speak to her- no answer.  Was able to reach her son [name]- Can have her double up on her lisinopril 5mg  for a total of 10mg  Please schedule a follow-up visit with me for next week

## 2017-07-05 NOTE — Telephone Encounter (Signed)
Copied from [place] [id number]. Topic: General - Other >> Jul 05, 2017 12:47 PM Valla Leaver wrote: Reason for CRM: Penni Bombard, PT w/ [facility] calling to notify patient has had consistently high bp and needs PT 1x for 5 weeks. Also needs nurse aid to help with shower 1x a wk for 5wks. Manual bp cuff is 170/80-90, and when using digital bp cuff it's the same, high 170-180/90/70's. Patient will call back to schedule appt for this. Her son transports her.

## 2017-07-05 NOTE — Telephone Encounter (Signed)
This is not a [employer] patient. Wasn't sure who to send it to but you were listed as her PCP.

## 2017-07-09 ENCOUNTER — Ambulatory Visit (INDEPENDENT_AMBULATORY_CARE_PROVIDER_SITE_OTHER): Payer: No Typology Code available for payment source | Admitting: Orthopaedic Surgery

## 2017-07-11 ENCOUNTER — Ambulatory Visit (INDEPENDENT_AMBULATORY_CARE_PROVIDER_SITE_OTHER): Payer: No Typology Code available for payment source | Admitting: Orthopaedic Surgery

## 2017-07-12 DIAGNOSIS — M1711 Unilateral primary osteoarthritis, right knee: Secondary | ICD-10-CM | POA: Diagnosis not present

## 2017-07-12 DIAGNOSIS — I420 Dilated cardiomyopathy: Secondary | ICD-10-CM | POA: Diagnosis not present

## 2017-07-12 DIAGNOSIS — L89622 Pressure ulcer of left heel, stage 2: Secondary | ICD-10-CM | POA: Diagnosis not present

## 2017-07-12 DIAGNOSIS — M19011 Primary osteoarthritis, right shoulder: Secondary | ICD-10-CM | POA: Diagnosis not present

## 2017-07-12 DIAGNOSIS — T8454XD Infection and inflammatory reaction due to internal left knee prosthesis, subsequent encounter: Secondary | ICD-10-CM | POA: Diagnosis not present

## 2017-07-12 DIAGNOSIS — I2510 Atherosclerotic heart disease of native coronary artery without angina pectoris: Secondary | ICD-10-CM | POA: Diagnosis not present

## 2017-07-15 ENCOUNTER — Ambulatory Visit (INDEPENDENT_AMBULATORY_CARE_PROVIDER_SITE_OTHER): Payer: Medicare Other | Admitting: Orthopaedic Surgery

## 2017-07-15 DIAGNOSIS — M1711 Unilateral primary osteoarthritis, right knee: Secondary | ICD-10-CM

## 2017-07-15 MED ORDER — METHYLPREDNISOLONE ACETATE 40 MG/ML IJ SUSP
40.0000 mg | INTRAMUSCULAR | Status: AC | PRN
  Administered 2017-07-15: 40 mg via INTRA_ARTICULAR

## 2017-07-15 MED ORDER — LIDOCAINE HCL 1 % IJ SOLN
2.0000 mL | INTRAMUSCULAR | Status: AC | PRN
  Administered 2017-07-15: 2 mL

## 2017-07-15 MED ORDER — BUPIVACAINE HCL 0.5 % IJ SOLN
2.0000 mL | INTRAMUSCULAR | Status: AC | PRN
  Administered 2017-07-15: 2 mL via INTRA_ARTICULAR

## 2017-07-15 NOTE — Progress Notes (Signed)
Office Visit Note   Patient: [name]           Date of Birth: 02-02-1941           MRN: [id number] Visit Date: 07/15/2017              Requested by: Darreld Mclean, MD [address] [place], [place] [address] PCP: Darreld Mclean, MD   Assessment & Plan: Visit Diagnoses:  1. Unilateral primary osteoarthritis, right knee     Plan: Impression is advanced degenerative joint disease right knee.  Cortisone injection was performed today.  Preauthorization for Monovisc injection was submitted today.  Follow-up as needed.  Follow-Up Instructions: Return if symptoms worsen or fail to improve.   Orders:  No orders of the defined types were placed in this encounter.  No orders of the defined types were placed in this encounter.     Procedures: Large Joint Inj: R knee on 07/15/2017 5:42 PM Indications: pain Details: 22 G needle  Arthrogram: No  Medications: 40 mg methylPREDNISolone acetate 40 MG/ML; 2 mL lidocaine 1 %; 2 mL bupivacaine 0.5 % Consent was given by the patient. Patient was prepped and draped in the usual sterile fashion.       Clinical Data: No additional findings.   Subjective: No chief complaint on file.   [name] comes in today for a right knee injection for her degenerative joint disease.  Her pain has gotten worse recently.    Review of Systems  Constitutional: Negative.   HENT: Negative.   Eyes: Negative.   Respiratory: Negative.   Cardiovascular: Negative.   Endocrine: Negative.   Musculoskeletal: Negative.   Neurological: Negative.   Hematological: Negative.   Psychiatric/Behavioral: Negative.   All other systems reviewed and are negative.    Objective: Vital Signs: There were no vitals taken for this visit.  Physical Exam  Constitutional: She is oriented to person, place, and time. She appears well-developed and well-nourished.  Pulmonary/Chest: Effort normal.  Neurological: She is alert and oriented to  person, place, and time.  Skin: Skin is warm. Capillary refill takes less than 2 seconds.  Psychiatric: She has a normal mood and affect. Her behavior is normal. Judgment and thought content normal.  Nursing note and vitals reviewed.   Ortho Exam Right knee exam shows no joint effusion. Specialty Comments:  No specialty comments available.  Imaging: No results found.   PMFS History: Patient Active Problem List   Diagnosis Date Noted  . Lateral dislocation of left patella 01/15/2017  . Unilateral primary osteoarthritis, right knee 01/15/2017  . Osteoporosis 11/26/2016  . Acute pain of left knee 08/27/2016  . Chronic diarrhea 08/07/2016  . Left displaced femoral neck fracture ([place]) 06/26/2016  . MRSA (methicillin resistant Staphylococcus aureus) infection 06/26/2016  . Hypertensive heart disease   . Hydronephrosis   . Obstructive uropathy   . Abnormal echocardiogram 01/23/2016  . Pressure ulcer 01/21/2016  . Chronic infection of prosthetic knee ([place]) 10/12/2015  . AAA 01/19/2009  . ILIAC ARTERY ANEURYSM 01/19/2009  . Hyperlipidemia 01/14/2009  . TOBACCO ABUSE 01/14/2009  . Essential hypertension 01/14/2009  . Coronary atherosclerosis 01/14/2009  . Cerebrovascular disease 01/14/2009  . Peripheral vascular disease ([place]) 01/14/2009  . CHRONIC OBSTRUCTIVE PULMONARY DISEASE 01/14/2009   Past Medical History:  Diagnosis Date  . Arthritis   . CAD (coronary artery disease)    a. 02/2007 s/p PCI/DES ot LCX and RCA;  b. 07/2014 MV: no ischemia/infarct, EF 68%.  [name]  Carotid arterial disease ([place])    a. 05/2015 Carotid U/S: RICA 0-34%, LICA 74-25%, RECA 9-56%, LECA >50% - overall stable, f/u 1 yr.  . Complication of anesthesia    Three days after procedure pt statrted to suffer with memoy loss that took about 4-5 days to come back according to pt son [name]."  . Dilated cardiomyopathy ([place])    a. 05/2005 Echo: EF 65%; b. 01/22/2016 Echo: EF 25-30% (in setting of admission for urosepsis  w/ elev trop); c. 01/26/2016 Echo: EF 35-40%, inf, distal apical, apical, and mid to distal septal HK-->felt to be 2/2 stress induced CM.  [name] Heart murmur   . History of bronchitis   . Hyperlipidemia   . Hypertension   . Hypertensive heart disease   . Memory impairment   . MRSA (methicillin resistant staph aureus) culture positive   . Nephrolithiasis    a. 12/2015 UVJ stone w/ hydroureteronephrosis req nephrostomy tube placement.  . Osteoarthritis    a. 09/8754 s/p L TKA complicated by infections req skin grafting.  [name] PVD (peripheral vascular disease) ([place])    a. Bilat common iliac dzs, nl ABI's --> med rx.  . Sepsis ([place])    a. 12/2015 admitted with Proteus mirabilis urosepsis/bacteremia.  . Wears glasses     Family History  Problem Relation Age of Onset  . Breast cancer Mother   . Leukemia Father   . Stroke Sister   . Bone cancer Unknown     Past Surgical History:  Procedure Laterality Date  . ABDOMINAL HYSTERECTOMY    . APPLICATION OF A-CELL OF EXTREMITY Left 12/08/2015   Procedure: APPLICATION OF A-CELL OF knee;  Surgeon: Courtney Lofty [name], DO;  Location: [facility];  Service: Plastics;  Laterality: Left;  . APPLICATION OF WOUND VAC Left 11/16/2015   Procedure: APPLICATION OF WOUND VAC;  Surgeon: Courtney Lofty [name], DO;  Location: [facility];  Service: Plastics;  Laterality: Left;  . APPLICATION OF WOUND VAC Left 12/08/2015   Procedure: APPLICATION OF WOUND VAC  AND CAST to knee;  Surgeon: Courtney Lofty [name], DO;  Location: [facility];  Service: Plastics;  Laterality: Left;  . COLONOSCOPY    . CORONARY ANGIOPLASTY  07   3 stents placed  . ESOPHAGOGASTRODUODENOSCOPY    . EYE SURGERY Bilateral    cataracts  . FREE FLAP TO EXTREMITY Left 11/16/2015   Procedure: FREE FLAP TO EXTREMITY;  Surgeon: Courtney Lofty [name], DO;  Location: [facility];  Service: Plastics;  Laterality: Left;  . I&D EXTREMITY Left 10/18/2015   Procedure: Left Knee Washout, Reclosure;  Surgeon: Courtney Minion, MD;  Location: [facility];  Service: Orthopedics;  Laterality: Left;  . I&D EXTREMITY Left 12/08/2015   Procedure: IRRIGATION AND DEBRIDEMENT knee;  Surgeon: Courtney Lofty [name], DO;  Location: [facility];  Service: Plastics;  Laterality: Left;  . I&D KNEE WITH POLY EXCHANGE Left 10/12/2015   Procedure: Irrigation and Debridement , Poly Exchange, Antibiotic Beads Left Knee;  Surgeon: Courtney Minion, MD;  Location: [facility];  Service: Orthopedics;  Laterality: Left;  . INCISION AND DRAINAGE OF WOUND Left 01/04/2016   Procedure: DEBRIDEMENT OF LEFT KNEE WOUND WITH ACELL AND WOUND VAC PLACEMENT;  Surgeon: Courtney Lofty [name], DO;  Location: [facility];  Service: Plastics;  Laterality: Left;  . IR GENERIC HISTORICAL  05/11/2016   IR FLUORO GUIDE CV MIDLINE PICC RIGHT 05/11/2016 Sandi Mariscal, MD [facility]  . IR GENERIC HISTORICAL  05/11/2016   IR US GUIDE VASC ACCESS  RIGHT 05/11/2016 Sandi Mariscal, MD [facility]  . KNEE CLOSED REDUCTION Left 01/16/2017   Procedure: CLOSED MANIPULATION LEFT KNEE;  Surgeon: Leandrew Koyanagi, MD;  Location: [facility];  Service: Orthopedics;  Laterality: Left;  . KNEE SURGERY     denies  . SKIN SPLIT GRAFT Left 11/16/2015   Procedure: LEFT GASTROC MUSCLE FLAP WITH SKIN GRAFT SPLIT THICKNESS AND VAC PLACEMENT;  Surgeon: Courtney Lofty [name], DO;  Location: [facility];  Service: Plastics;  Laterality: Left;  . TOTAL HIP ARTHROPLASTY Right 09/10/2012   Dr Sharol Given  . TOTAL HIP ARTHROPLASTY Right 09/10/2012   Procedure: TOTAL HIP ARTHROPLASTY;  Surgeon: Courtney Minion, MD;  Location: [facility];  Service: Orthopedics;  Laterality: Right;  Right Total Hip Arthroplasty  . TOTAL HIP ARTHROPLASTY Left 06/27/2016   Procedure: LEFT TOTAL HIP ARTHROPLASTY ANTERIOR APPROACH;  Surgeon: Leandrew Koyanagi, MD;  Location: [facility];  Service: Orthopedics;  Laterality: Left;  . TOTAL KNEE ARTHROPLASTY Left 09/28/2015   Procedure: TOTAL KNEE ARTHROPLASTY;  Surgeon: Courtney Minion, MD;  Location: [facility];  Service: Orthopedics;  Laterality:  Left;   Social History   Occupational History  . Occupation: Retired  Tobacco Use  . Smoking status: Former Smoker    Packs/day: 0.50    Years: 50.00    Pack years: 25.00    Types: Cigarettes    Last attempt to quit: 09/28/2015    Years since quitting: 1.7  . Smokeless tobacco: Never Used  . Tobacco comment: quit 09/27/15  Substance and Sexual Activity  . Alcohol use: No  . Drug use: No  . Sexual activity: No

## 2017-07-17 DIAGNOSIS — M19011 Primary osteoarthritis, right shoulder: Secondary | ICD-10-CM | POA: Diagnosis not present

## 2017-07-17 DIAGNOSIS — T8454XD Infection and inflammatory reaction due to internal left knee prosthesis, subsequent encounter: Secondary | ICD-10-CM | POA: Diagnosis not present

## 2017-07-17 DIAGNOSIS — M1711 Unilateral primary osteoarthritis, right knee: Secondary | ICD-10-CM | POA: Diagnosis not present

## 2017-07-17 DIAGNOSIS — L89622 Pressure ulcer of left heel, stage 2: Secondary | ICD-10-CM | POA: Diagnosis not present

## 2017-07-17 DIAGNOSIS — I420 Dilated cardiomyopathy: Secondary | ICD-10-CM | POA: Diagnosis not present

## 2017-07-17 DIAGNOSIS — I2510 Atherosclerotic heart disease of native coronary artery without angina pectoris: Secondary | ICD-10-CM | POA: Diagnosis not present

## 2017-07-19 DIAGNOSIS — I420 Dilated cardiomyopathy: Secondary | ICD-10-CM | POA: Diagnosis not present

## 2017-07-19 DIAGNOSIS — L89622 Pressure ulcer of left heel, stage 2: Secondary | ICD-10-CM | POA: Diagnosis not present

## 2017-07-19 DIAGNOSIS — M1711 Unilateral primary osteoarthritis, right knee: Secondary | ICD-10-CM | POA: Diagnosis not present

## 2017-07-19 DIAGNOSIS — M19011 Primary osteoarthritis, right shoulder: Secondary | ICD-10-CM | POA: Diagnosis not present

## 2017-07-19 DIAGNOSIS — I2510 Atherosclerotic heart disease of native coronary artery without angina pectoris: Secondary | ICD-10-CM | POA: Diagnosis not present

## 2017-07-19 DIAGNOSIS — T8454XD Infection and inflammatory reaction due to internal left knee prosthesis, subsequent encounter: Secondary | ICD-10-CM | POA: Diagnosis not present

## 2017-07-25 ENCOUNTER — Telehealth: Payer: Self-pay | Admitting: Family Medicine

## 2017-07-25 ENCOUNTER — Other Ambulatory Visit: Payer: Self-pay | Admitting: *Deleted

## 2017-07-25 MED ORDER — LISINOPRIL 5 MG PO TABS: 5.0000 mg | ORAL_TABLET | Freq: Every day | ORAL | 6 refills | Status: DC

## 2017-07-25 NOTE — Telephone Encounter (Signed)
Copied from [place] [id number]. Topic: Quick Communication - Rx Refill/Question >> Jul 25, 2017  9:39 AM Antonieta Iba C wrote: Self.   Refill for Lisinopril-   Pharmacy: [facility] at [place], [place]: Please be advised that RX refills may take up to 3 business days. We ask that you follow-up with your pharmacy.

## 2017-07-26 DIAGNOSIS — L89622 Pressure ulcer of left heel, stage 2: Secondary | ICD-10-CM | POA: Diagnosis not present

## 2017-07-26 DIAGNOSIS — M19011 Primary osteoarthritis, right shoulder: Secondary | ICD-10-CM | POA: Diagnosis not present

## 2017-07-26 DIAGNOSIS — I420 Dilated cardiomyopathy: Secondary | ICD-10-CM | POA: Diagnosis not present

## 2017-07-26 DIAGNOSIS — T8454XD Infection and inflammatory reaction due to internal left knee prosthesis, subsequent encounter: Secondary | ICD-10-CM | POA: Diagnosis not present

## 2017-07-26 DIAGNOSIS — M1711 Unilateral primary osteoarthritis, right knee: Secondary | ICD-10-CM | POA: Diagnosis not present

## 2017-07-26 DIAGNOSIS — I2510 Atherosclerotic heart disease of native coronary artery without angina pectoris: Secondary | ICD-10-CM | POA: Diagnosis not present

## 2017-07-31 DIAGNOSIS — T8454XD Infection and inflammatory reaction due to internal left knee prosthesis, subsequent encounter: Secondary | ICD-10-CM | POA: Diagnosis not present

## 2017-07-31 DIAGNOSIS — M1711 Unilateral primary osteoarthritis, right knee: Secondary | ICD-10-CM | POA: Diagnosis not present

## 2017-07-31 DIAGNOSIS — M19011 Primary osteoarthritis, right shoulder: Secondary | ICD-10-CM | POA: Diagnosis not present

## 2017-07-31 DIAGNOSIS — I420 Dilated cardiomyopathy: Secondary | ICD-10-CM | POA: Diagnosis not present

## 2017-07-31 DIAGNOSIS — I2510 Atherosclerotic heart disease of native coronary artery without angina pectoris: Secondary | ICD-10-CM | POA: Diagnosis not present

## 2017-07-31 DIAGNOSIS — L89622 Pressure ulcer of left heel, stage 2: Secondary | ICD-10-CM | POA: Diagnosis not present

## 2017-08-01 ENCOUNTER — Telehealth: Payer: Self-pay | Admitting: Family Medicine

## 2017-08-01 NOTE — Telephone Encounter (Signed)
Shanon Brow, pharmacist from [facility] called with questions on this patient's Lisinopril. He has a refill for Lisinopril 5mg  daily. The patient told him the last time she saw the doctor, she told her to take 10mg . The chart medication list has Lisinopril 5mg  daily. According to the noted dated 07/05/17 of Dr. Lorelei Pont, the patient's son was notified to double up on her lisinopril 5mg  for a total of 10mg  and schedule a follow-up visit for next week.  Shanon Brow notified of the note, but I was unable to authorize this. I informed him Dr. Lorelei Pont will be notified to send in a new prescription reflecting the change, he verbalized understanding.

## 2017-08-02 DIAGNOSIS — I420 Dilated cardiomyopathy: Secondary | ICD-10-CM | POA: Diagnosis not present

## 2017-08-02 DIAGNOSIS — T8454XD Infection and inflammatory reaction due to internal left knee prosthesis, subsequent encounter: Secondary | ICD-10-CM | POA: Diagnosis not present

## 2017-08-02 DIAGNOSIS — M19011 Primary osteoarthritis, right shoulder: Secondary | ICD-10-CM | POA: Diagnosis not present

## 2017-08-02 DIAGNOSIS — L89622 Pressure ulcer of left heel, stage 2: Secondary | ICD-10-CM | POA: Diagnosis not present

## 2017-08-02 DIAGNOSIS — M1711 Unilateral primary osteoarthritis, right knee: Secondary | ICD-10-CM | POA: Diagnosis not present

## 2017-08-02 DIAGNOSIS — I2510 Atherosclerotic heart disease of native coronary artery without angina pectoris: Secondary | ICD-10-CM | POA: Diagnosis not present

## 2017-08-02 MED ORDER — LISINOPRIL 10 MG PO TABS: 10.0000 mg | ORAL_TABLET | Freq: Every day | ORAL | 3 refills | Status: DC

## 2017-08-02 NOTE — Addendum Note (Signed)
Addended by: Lamar Blinks C on: 08/02/2017 12:34 PM   Modules accepted: Orders

## 2017-08-05 DIAGNOSIS — M1711 Unilateral primary osteoarthritis, right knee: Secondary | ICD-10-CM | POA: Diagnosis not present

## 2017-08-05 DIAGNOSIS — M19011 Primary osteoarthritis, right shoulder: Secondary | ICD-10-CM | POA: Diagnosis not present

## 2017-08-05 DIAGNOSIS — I420 Dilated cardiomyopathy: Secondary | ICD-10-CM | POA: Diagnosis not present

## 2017-08-05 DIAGNOSIS — L89622 Pressure ulcer of left heel, stage 2: Secondary | ICD-10-CM | POA: Diagnosis not present

## 2017-08-05 DIAGNOSIS — I2510 Atherosclerotic heart disease of native coronary artery without angina pectoris: Secondary | ICD-10-CM | POA: Diagnosis not present

## 2017-08-05 DIAGNOSIS — T8454XD Infection and inflammatory reaction due to internal left knee prosthesis, subsequent encounter: Secondary | ICD-10-CM | POA: Diagnosis not present

## 2017-08-05 NOTE — Telephone Encounter (Signed)
Copied from [place] [id number]. Topic: General - Other >> Aug 05, 2017  3:31 PM Patrice Paradise wrote: Reason for CRM:  Penni Bombard, PT w/ [facility] [phone]. Called to get verbal orders to continue seeing the patient for 1 week for 8 more weeks. All she would like for the patient to be seen by any occupational therapy until she feel safe being by herself to shower. She under able to shower by herself.

## 2017-08-05 NOTE — Telephone Encounter (Signed)
Called and gave VO 

## 2017-08-07 DIAGNOSIS — I2510 Atherosclerotic heart disease of native coronary artery without angina pectoris: Secondary | ICD-10-CM | POA: Diagnosis not present

## 2017-08-07 DIAGNOSIS — I10 Essential (primary) hypertension: Secondary | ICD-10-CM | POA: Diagnosis not present

## 2017-08-07 DIAGNOSIS — E785 Hyperlipidemia, unspecified: Secondary | ICD-10-CM | POA: Diagnosis not present

## 2017-08-07 DIAGNOSIS — Z87891 Personal history of nicotine dependence: Secondary | ICD-10-CM | POA: Diagnosis not present

## 2017-08-07 DIAGNOSIS — I420 Dilated cardiomyopathy: Secondary | ICD-10-CM | POA: Diagnosis not present

## 2017-08-07 DIAGNOSIS — I739 Peripheral vascular disease, unspecified: Secondary | ICD-10-CM | POA: Diagnosis not present

## 2017-08-07 DIAGNOSIS — Z955 Presence of coronary angioplasty implant and graft: Secondary | ICD-10-CM | POA: Diagnosis not present

## 2017-08-07 DIAGNOSIS — M6281 Muscle weakness (generalized): Secondary | ICD-10-CM | POA: Diagnosis not present

## 2017-08-07 DIAGNOSIS — J449 Chronic obstructive pulmonary disease, unspecified: Secondary | ICD-10-CM | POA: Diagnosis not present

## 2017-08-07 DIAGNOSIS — T8454XD Infection and inflammatory reaction due to internal left knee prosthesis, subsequent encounter: Secondary | ICD-10-CM | POA: Diagnosis not present

## 2017-08-07 DIAGNOSIS — M19011 Primary osteoarthritis, right shoulder: Secondary | ICD-10-CM | POA: Diagnosis not present

## 2017-08-07 DIAGNOSIS — M1711 Unilateral primary osteoarthritis, right knee: Secondary | ICD-10-CM | POA: Diagnosis not present

## 2017-08-07 DIAGNOSIS — M810 Age-related osteoporosis without current pathological fracture: Secondary | ICD-10-CM | POA: Diagnosis not present

## 2017-08-08 ENCOUNTER — Telehealth: Payer: Self-pay | Admitting: *Deleted

## 2017-08-08 NOTE — Telephone Encounter (Signed)
Received Physician Orders from [facility] [x2]; forwarded to provider/SLS 01/10

## 2017-08-09 ENCOUNTER — Telehealth: Payer: Self-pay | Admitting: *Deleted

## 2017-08-09 NOTE — Telephone Encounter (Signed)
Received Physician Orders from [facility]; forwarded to provider/SLS 01/11

## 2017-08-16 DIAGNOSIS — I2510 Atherosclerotic heart disease of native coronary artery without angina pectoris: Secondary | ICD-10-CM | POA: Diagnosis not present

## 2017-08-16 DIAGNOSIS — I10 Essential (primary) hypertension: Secondary | ICD-10-CM | POA: Diagnosis not present

## 2017-08-16 DIAGNOSIS — M1711 Unilateral primary osteoarthritis, right knee: Secondary | ICD-10-CM | POA: Diagnosis not present

## 2017-08-16 DIAGNOSIS — I420 Dilated cardiomyopathy: Secondary | ICD-10-CM | POA: Diagnosis not present

## 2017-08-16 DIAGNOSIS — T8454XD Infection and inflammatory reaction due to internal left knee prosthesis, subsequent encounter: Secondary | ICD-10-CM | POA: Diagnosis not present

## 2017-08-16 DIAGNOSIS — M19011 Primary osteoarthritis, right shoulder: Secondary | ICD-10-CM | POA: Diagnosis not present

## 2017-08-19 ENCOUNTER — Other Ambulatory Visit: Payer: Self-pay | Admitting: Cardiovascular Disease

## 2017-08-19 DIAGNOSIS — I10 Essential (primary) hypertension: Secondary | ICD-10-CM | POA: Diagnosis not present

## 2017-08-19 DIAGNOSIS — I420 Dilated cardiomyopathy: Secondary | ICD-10-CM | POA: Diagnosis not present

## 2017-08-19 DIAGNOSIS — T8454XD Infection and inflammatory reaction due to internal left knee prosthesis, subsequent encounter: Secondary | ICD-10-CM | POA: Diagnosis not present

## 2017-08-19 DIAGNOSIS — M1711 Unilateral primary osteoarthritis, right knee: Secondary | ICD-10-CM | POA: Diagnosis not present

## 2017-08-19 DIAGNOSIS — M19011 Primary osteoarthritis, right shoulder: Secondary | ICD-10-CM | POA: Diagnosis not present

## 2017-08-19 DIAGNOSIS — I2510 Atherosclerotic heart disease of native coronary artery without angina pectoris: Secondary | ICD-10-CM | POA: Diagnosis not present

## 2017-08-20 ENCOUNTER — Ambulatory Visit (INDEPENDENT_AMBULATORY_CARE_PROVIDER_SITE_OTHER): Payer: Medicare Other | Admitting: Orthopaedic Surgery

## 2017-08-20 DIAGNOSIS — M1711 Unilateral primary osteoarthritis, right knee: Secondary | ICD-10-CM | POA: Diagnosis not present

## 2017-08-20 MED ORDER — HYALURONAN 88 MG/4ML IX SOSY
88.0000 mg | PREFILLED_SYRINGE | INTRA_ARTICULAR | Status: AC | PRN
  Administered 2017-08-20: 88 mg via INTRA_ARTICULAR

## 2017-08-20 NOTE — Progress Notes (Signed)
   Procedure Note  Patient: [name]             Date of Birth: October 23, 1940           MRN: [id number]             Visit Date: 08/20/2017  Procedures: Visit Diagnoses: Unilateral primary osteoarthritis, right knee  Large Joint Inj: R knee on 08/20/2017 10:11 AM Indications: pain Details: 22 G needle  Arthrogram: No  Medications: 88 mg Hyaluronan 88 MG/4ML Outcome: tolerated well, no immediate complications Patient was prepped and draped in the usual sterile fashion.

## 2017-08-28 DIAGNOSIS — I420 Dilated cardiomyopathy: Secondary | ICD-10-CM | POA: Diagnosis not present

## 2017-08-28 DIAGNOSIS — T8454XD Infection and inflammatory reaction due to internal left knee prosthesis, subsequent encounter: Secondary | ICD-10-CM | POA: Diagnosis not present

## 2017-08-28 DIAGNOSIS — I10 Essential (primary) hypertension: Secondary | ICD-10-CM | POA: Diagnosis not present

## 2017-08-28 DIAGNOSIS — M19011 Primary osteoarthritis, right shoulder: Secondary | ICD-10-CM | POA: Diagnosis not present

## 2017-08-28 DIAGNOSIS — I2510 Atherosclerotic heart disease of native coronary artery without angina pectoris: Secondary | ICD-10-CM | POA: Diagnosis not present

## 2017-08-28 DIAGNOSIS — M1711 Unilateral primary osteoarthritis, right knee: Secondary | ICD-10-CM | POA: Diagnosis not present

## 2017-08-30 ENCOUNTER — Telehealth: Payer: Self-pay | Admitting: *Deleted

## 2017-08-30 NOTE — Telephone Encounter (Signed)
Received Physician Orders from [facility]; forwarded to provider/SLS 02/01

## 2017-08-30 NOTE — Telephone Encounter (Signed)
Received Home Health Certification and Plan of Care; forwarded to provider/SLS 02/01 

## 2017-08-31 DIAGNOSIS — M1711 Unilateral primary osteoarthritis, right knee: Secondary | ICD-10-CM | POA: Diagnosis not present

## 2017-08-31 DIAGNOSIS — T8454XD Infection and inflammatory reaction due to internal left knee prosthesis, subsequent encounter: Secondary | ICD-10-CM | POA: Diagnosis not present

## 2017-08-31 DIAGNOSIS — I2510 Atherosclerotic heart disease of native coronary artery without angina pectoris: Secondary | ICD-10-CM | POA: Diagnosis not present

## 2017-08-31 DIAGNOSIS — M19011 Primary osteoarthritis, right shoulder: Secondary | ICD-10-CM | POA: Diagnosis not present

## 2017-08-31 DIAGNOSIS — I420 Dilated cardiomyopathy: Secondary | ICD-10-CM | POA: Diagnosis not present

## 2017-08-31 DIAGNOSIS — I10 Essential (primary) hypertension: Secondary | ICD-10-CM | POA: Diagnosis not present

## 2017-09-02 ENCOUNTER — Telehealth: Payer: Self-pay | Admitting: Family Medicine

## 2017-09-02 NOTE — Telephone Encounter (Unsigned)
Copied from [place]. Topic: Quick Communication - See Telephone Encounter >> Sep 02, 2017  1:58 PM Percell Belt A wrote: CRM for notification. See Telephone encounter for: [place] New[facility] with [facility] - [phone]  Need verbals for Home health OT 1 week 4  09/02/17.

## 2017-09-04 NOTE — Telephone Encounter (Signed)
Called back to give verbal order as request. No answer, left message approving order.

## 2017-09-05 DIAGNOSIS — I420 Dilated cardiomyopathy: Secondary | ICD-10-CM | POA: Diagnosis not present

## 2017-09-05 DIAGNOSIS — I2510 Atherosclerotic heart disease of native coronary artery without angina pectoris: Secondary | ICD-10-CM | POA: Diagnosis not present

## 2017-09-05 DIAGNOSIS — I10 Essential (primary) hypertension: Secondary | ICD-10-CM | POA: Diagnosis not present

## 2017-09-05 DIAGNOSIS — M19011 Primary osteoarthritis, right shoulder: Secondary | ICD-10-CM | POA: Diagnosis not present

## 2017-09-05 DIAGNOSIS — M1711 Unilateral primary osteoarthritis, right knee: Secondary | ICD-10-CM | POA: Diagnosis not present

## 2017-09-05 DIAGNOSIS — T8454XD Infection and inflammatory reaction due to internal left knee prosthesis, subsequent encounter: Secondary | ICD-10-CM | POA: Diagnosis not present

## 2017-09-07 DIAGNOSIS — M19011 Primary osteoarthritis, right shoulder: Secondary | ICD-10-CM | POA: Diagnosis not present

## 2017-09-07 DIAGNOSIS — I420 Dilated cardiomyopathy: Secondary | ICD-10-CM | POA: Diagnosis not present

## 2017-09-07 DIAGNOSIS — I10 Essential (primary) hypertension: Secondary | ICD-10-CM | POA: Diagnosis not present

## 2017-09-07 DIAGNOSIS — T8454XD Infection and inflammatory reaction due to internal left knee prosthesis, subsequent encounter: Secondary | ICD-10-CM | POA: Diagnosis not present

## 2017-09-07 DIAGNOSIS — I2510 Atherosclerotic heart disease of native coronary artery without angina pectoris: Secondary | ICD-10-CM | POA: Diagnosis not present

## 2017-09-07 DIAGNOSIS — M1711 Unilateral primary osteoarthritis, right knee: Secondary | ICD-10-CM | POA: Diagnosis not present

## 2017-09-11 DIAGNOSIS — T8454XD Infection and inflammatory reaction due to internal left knee prosthesis, subsequent encounter: Secondary | ICD-10-CM | POA: Diagnosis not present

## 2017-09-11 DIAGNOSIS — I10 Essential (primary) hypertension: Secondary | ICD-10-CM | POA: Diagnosis not present

## 2017-09-11 DIAGNOSIS — I420 Dilated cardiomyopathy: Secondary | ICD-10-CM | POA: Diagnosis not present

## 2017-09-11 DIAGNOSIS — I2510 Atherosclerotic heart disease of native coronary artery without angina pectoris: Secondary | ICD-10-CM | POA: Diagnosis not present

## 2017-09-11 DIAGNOSIS — M19011 Primary osteoarthritis, right shoulder: Secondary | ICD-10-CM | POA: Diagnosis not present

## 2017-09-11 DIAGNOSIS — M1711 Unilateral primary osteoarthritis, right knee: Secondary | ICD-10-CM | POA: Diagnosis not present

## 2017-09-12 DIAGNOSIS — M19011 Primary osteoarthritis, right shoulder: Secondary | ICD-10-CM | POA: Diagnosis not present

## 2017-09-12 DIAGNOSIS — T8454XD Infection and inflammatory reaction due to internal left knee prosthesis, subsequent encounter: Secondary | ICD-10-CM | POA: Diagnosis not present

## 2017-09-12 DIAGNOSIS — I2510 Atherosclerotic heart disease of native coronary artery without angina pectoris: Secondary | ICD-10-CM | POA: Diagnosis not present

## 2017-09-12 DIAGNOSIS — I420 Dilated cardiomyopathy: Secondary | ICD-10-CM | POA: Diagnosis not present

## 2017-09-12 DIAGNOSIS — I10 Essential (primary) hypertension: Secondary | ICD-10-CM | POA: Diagnosis not present

## 2017-09-12 DIAGNOSIS — M1711 Unilateral primary osteoarthritis, right knee: Secondary | ICD-10-CM | POA: Diagnosis not present

## 2017-09-17 DIAGNOSIS — T8454XD Infection and inflammatory reaction due to internal left knee prosthesis, subsequent encounter: Secondary | ICD-10-CM | POA: Diagnosis not present

## 2017-09-17 DIAGNOSIS — I420 Dilated cardiomyopathy: Secondary | ICD-10-CM | POA: Diagnosis not present

## 2017-09-17 DIAGNOSIS — M19011 Primary osteoarthritis, right shoulder: Secondary | ICD-10-CM | POA: Diagnosis not present

## 2017-09-17 DIAGNOSIS — I2510 Atherosclerotic heart disease of native coronary artery without angina pectoris: Secondary | ICD-10-CM | POA: Diagnosis not present

## 2017-09-17 DIAGNOSIS — M1711 Unilateral primary osteoarthritis, right knee: Secondary | ICD-10-CM | POA: Diagnosis not present

## 2017-09-17 DIAGNOSIS — I10 Essential (primary) hypertension: Secondary | ICD-10-CM | POA: Diagnosis not present

## 2017-09-18 DIAGNOSIS — M19011 Primary osteoarthritis, right shoulder: Secondary | ICD-10-CM | POA: Diagnosis not present

## 2017-09-18 DIAGNOSIS — I2510 Atherosclerotic heart disease of native coronary artery without angina pectoris: Secondary | ICD-10-CM | POA: Diagnosis not present

## 2017-09-18 DIAGNOSIS — M1711 Unilateral primary osteoarthritis, right knee: Secondary | ICD-10-CM | POA: Diagnosis not present

## 2017-09-18 DIAGNOSIS — I10 Essential (primary) hypertension: Secondary | ICD-10-CM | POA: Diagnosis not present

## 2017-09-18 DIAGNOSIS — T8454XD Infection and inflammatory reaction due to internal left knee prosthesis, subsequent encounter: Secondary | ICD-10-CM | POA: Diagnosis not present

## 2017-09-18 DIAGNOSIS — I420 Dilated cardiomyopathy: Secondary | ICD-10-CM | POA: Diagnosis not present

## 2017-09-23 ENCOUNTER — Telehealth: Payer: Self-pay | Admitting: Family Medicine

## 2017-09-23 NOTE — Telephone Encounter (Signed)
Copied from [place] [id number]. Topic: Quick Communication - See Telephone Encounter >> Sep 23, 2017  2:30 PM Synthia Innocent wrote: CRM for notification. See Telephone encounter for: request verbal order for OT 1x a week for 2 weeks  09/23/17.

## 2017-09-23 NOTE — Telephone Encounter (Signed)
Spoke w/ Manuela Schwartz, verbal orders given.

## 2017-09-26 DIAGNOSIS — I2510 Atherosclerotic heart disease of native coronary artery without angina pectoris: Secondary | ICD-10-CM | POA: Diagnosis not present

## 2017-09-26 DIAGNOSIS — I10 Essential (primary) hypertension: Secondary | ICD-10-CM | POA: Diagnosis not present

## 2017-09-26 DIAGNOSIS — T8454XD Infection and inflammatory reaction due to internal left knee prosthesis, subsequent encounter: Secondary | ICD-10-CM | POA: Diagnosis not present

## 2017-09-26 DIAGNOSIS — M1711 Unilateral primary osteoarthritis, right knee: Secondary | ICD-10-CM | POA: Diagnosis not present

## 2017-09-26 DIAGNOSIS — M19011 Primary osteoarthritis, right shoulder: Secondary | ICD-10-CM | POA: Diagnosis not present

## 2017-09-26 DIAGNOSIS — I420 Dilated cardiomyopathy: Secondary | ICD-10-CM | POA: Diagnosis not present

## 2017-09-27 DIAGNOSIS — I10 Essential (primary) hypertension: Secondary | ICD-10-CM | POA: Diagnosis not present

## 2017-09-27 DIAGNOSIS — I2510 Atherosclerotic heart disease of native coronary artery without angina pectoris: Secondary | ICD-10-CM | POA: Diagnosis not present

## 2017-09-27 DIAGNOSIS — M1711 Unilateral primary osteoarthritis, right knee: Secondary | ICD-10-CM | POA: Diagnosis not present

## 2017-09-27 DIAGNOSIS — M19011 Primary osteoarthritis, right shoulder: Secondary | ICD-10-CM | POA: Diagnosis not present

## 2017-09-27 DIAGNOSIS — T8454XD Infection and inflammatory reaction due to internal left knee prosthesis, subsequent encounter: Secondary | ICD-10-CM | POA: Diagnosis not present

## 2017-09-27 DIAGNOSIS — I420 Dilated cardiomyopathy: Secondary | ICD-10-CM | POA: Diagnosis not present

## 2017-09-30 ENCOUNTER — Telehealth: Payer: Self-pay | Admitting: *Deleted

## 2017-09-30 NOTE — Telephone Encounter (Signed)
Received Physician Orders from [facility]; forwarded to provider/SLS 03/04  

## 2017-10-01 DIAGNOSIS — T8454XD Infection and inflammatory reaction due to internal left knee prosthesis, subsequent encounter: Secondary | ICD-10-CM | POA: Diagnosis not present

## 2017-10-01 DIAGNOSIS — I2510 Atherosclerotic heart disease of native coronary artery without angina pectoris: Secondary | ICD-10-CM | POA: Diagnosis not present

## 2017-10-01 DIAGNOSIS — I420 Dilated cardiomyopathy: Secondary | ICD-10-CM | POA: Diagnosis not present

## 2017-10-01 DIAGNOSIS — M1711 Unilateral primary osteoarthritis, right knee: Secondary | ICD-10-CM | POA: Diagnosis not present

## 2017-10-01 DIAGNOSIS — I10 Essential (primary) hypertension: Secondary | ICD-10-CM | POA: Diagnosis not present

## 2017-10-01 DIAGNOSIS — M19011 Primary osteoarthritis, right shoulder: Secondary | ICD-10-CM | POA: Diagnosis not present

## 2017-10-09 ENCOUNTER — Telehealth: Payer: Self-pay | Admitting: *Deleted

## 2017-10-09 NOTE — Telephone Encounter (Signed)
Received Physician Orders from [facility]; forwarded to provider/SLS 03/13

## 2017-10-09 NOTE — Telephone Encounter (Signed)
Received Physician Orders from [facility]; forwarded to provider/SLS 03/13

## 2017-11-05 ENCOUNTER — Other Ambulatory Visit: Payer: Self-pay | Admitting: Cardiology

## 2017-11-05 NOTE — Telephone Encounter (Signed)
REFILL 

## 2017-11-08 ENCOUNTER — Other Ambulatory Visit: Payer: Self-pay | Admitting: Cardiology

## 2017-12-26 ENCOUNTER — Encounter: Payer: Self-pay | Admitting: Internal Medicine

## 2017-12-26 ENCOUNTER — Ambulatory Visit (INDEPENDENT_AMBULATORY_CARE_PROVIDER_SITE_OTHER): Payer: Medicare Other | Admitting: Internal Medicine

## 2017-12-26 DIAGNOSIS — T8459XD Infection and inflammatory reaction due to other internal joint prosthesis, subsequent encounter: Secondary | ICD-10-CM

## 2017-12-26 DIAGNOSIS — Z96659 Presence of unspecified artificial knee joint: Secondary | ICD-10-CM | POA: Diagnosis not present

## 2017-12-26 MED ORDER — DOXYCYCLINE HYCLATE 100 MG PO TABS: 100.0000 mg | ORAL_TABLET | Freq: Two times a day (BID) | ORAL | 11 refills | Status: AC

## 2017-12-26 MED ORDER — RIFAMPIN 300 MG PO CAPS: 300.0000 mg | ORAL_CAPSULE | Freq: Two times a day (BID) | ORAL | 11 refills | Status: AC

## 2017-12-26 NOTE — Progress Notes (Signed)
[facility] for Infectious Disease  Patient Active Problem List   Diagnosis Date Noted  . MRSA (methicillin resistant Staphylococcus aureus) infection 06/26/2016    Priority: High  . Chronic infection of prosthetic knee ([place]) 10/12/2015    Priority: High  . Lateral dislocation of left patella 01/15/2017  . Unilateral primary osteoarthritis, right knee 01/15/2017  . Osteoporosis 11/26/2016  . Acute pain of left knee 08/27/2016  . Chronic diarrhea 08/07/2016  . Left displaced femoral neck fracture ([place]) 06/26/2016  . Hypertensive heart disease   . Hydronephrosis   . Obstructive uropathy   . Abnormal echocardiogram 01/23/2016  . Pressure ulcer 01/21/2016  . AAA 01/19/2009  . ILIAC ARTERY ANEURYSM 01/19/2009  . Hyperlipidemia 01/14/2009  . TOBACCO ABUSE 01/14/2009  . Essential hypertension 01/14/2009  . Coronary atherosclerosis 01/14/2009  . Cerebrovascular disease 01/14/2009  . Peripheral vascular disease ([place]) 01/14/2009  . CHRONIC OBSTRUCTIVE PULMONARY DISEASE 01/14/2009    Patient's Medications  New Prescriptions   No medications on file  Previous Medications   ACETAMINOPHEN (TYLENOL) 325 MG TABLET    Take 2 tablets (650 mg total) by mouth every 6 (six) hours as needed for mild pain (or Fever >/= 101).   ASPIRIN EC 81 MG TABLET    Take 81 mg by mouth daily.   ATORVASTATIN (LIPITOR) 40 MG TABLET    TAKE ONE TABLET BY MOUTH DAILY   CARVEDILOL (COREG) 3.125 MG TABLET    TAKE ONE TABLET BY MOUTH TWICE A DAY WITH A MEAL   FUROSEMIDE (LASIX) 20 MG TABLET    TAKE ONE TABLET BY MOUTH DAILY   LISINOPRIL (PRINIVIL,ZESTRIL) 10 MG TABLET    Take 1 tablet (10 mg total) by mouth daily.   MAGNESIUM OXIDE (MAG-OX) 400 MG TABLET    Take 400 mg by mouth.   MESALAMINE (LIALDA) 1.2 G EC TABLET    Take 2.4 g by mouth daily with breakfast.   MULTIPLE VITAMIN (MULITIVITAMIN WITH MINERALS) TABS    Take 1 tablet by mouth daily.   NAPROXEN SODIUM (ALEVE) 220 MG TABLET    Take 220  mg by mouth as needed.   POTASSIUM CHLORIDE SA (K-DUR,KLOR-CON) 20 MEQ TABLET    TAKE ONE TABLET BY MOUTH DAILY   PROBIOTIC PRODUCT (PROBIOTIC DAILY PO)    Take 1 capsule by mouth daily.   TRAMADOL (ULTRAM) 50 MG TABLET    Take 1 tablet (50 mg total) by mouth every 6 (six) hours as needed.  Modified Medications   Modified Medication Previous Medication   DOXYCYCLINE (VIBRA-TABS) 100 MG TABLET doxycycline (VIBRA-TABS) 100 MG tablet      Take 1 tablet (100 mg total) by mouth 2 (two) times daily.    Take 1 tablet (100 mg total) by mouth 2 (two) times daily.   RIFAMPIN (RIFADIN) 300 MG CAPSULE rifampin (RIFADIN) 300 MG capsule      Take 1 capsule (300 mg total) by mouth 2 (two) times daily.    Take 1 capsule (300 mg total) by mouth 2 (two) times daily.  Discontinued Medications   No medications on file    Subjective: Ms. Mcfayden is in with her daughter for her routine follow-up visit.  She continues to take doxycycline and rifampin as chronic suppressive therapy for MRSA left prosthetic knee infection.  She has colitis and usually has 3-4 soft bowel movements daily.  This is been a little less of a problem recently.  She is also concerned about worsening  urinary incontinence.  She feels like her left kneecap is more laterally displaced.  She changed her braces several months ago because her previous brace was causing irritation on the lateral aspect of her left knee.  She has had no further drainage from the incisional sinus tract.  Review of Systems: Review of Systems  Constitutional: Negative for chills, diaphoresis and fever.  Gastrointestinal: Positive for diarrhea and nausea. Negative for abdominal pain and vomiting.  Genitourinary:       Stress urinary incontinence.  Musculoskeletal: Positive for joint pain.  Skin: Negative for rash.    Past Medical History:  Diagnosis Date  . Arthritis   . CAD (coronary artery disease)    a. 02/2007 s/p PCI/DES ot LCX and RCA;  b. 07/2014 MV: no  ischemia/infarct, EF 68%.  . Carotid arterial disease ([place])    a. 05/2015 Carotid U/S: RICA 3-71%, LICA 69-67%, RECA 8-93%, LECA >50% - overall stable, f/u 1 yr.  . Complication of anesthesia    Three days after procedure pt statrted to suffer with memoy loss that took about 4-5 days to come back according to [name]."  . Dilated cardiomyopathy ([place])    a. 05/2005 Echo: EF 65%; b. 01/22/2016 Echo: EF 25-30% (in setting of admission for urosepsis w/ elev trop); c. 01/26/2016 Echo: EF 35-40%, inf, distal apical, apical, and mid to distal septal HK-->felt to be 2/2 stress induced CM.  [name] Heart murmur   . History of bronchitis   . Hyperlipidemia   . Hypertension   . Hypertensive heart disease   . Memory impairment   . MRSA (methicillin resistant staph aureus) culture positive   . Nephrolithiasis    a. 12/2015 UVJ stone w/ hydroureteronephrosis req nephrostomy tube placement.  . Osteoarthritis    a. 02/1016 s/p L TKA complicated by infections req skin grafting.  [name] PVD (peripheral vascular disease) ([place])    a. Bilat common iliac dzs, nl ABI's --> med rx.  . Sepsis ([place])    a. 12/2015 admitted with Proteus mirabilis urosepsis/bacteremia.  . Wears glasses     Social History   Tobacco Use  . Smoking status: Former Smoker    Packs/day: 0.50    Years: 50.00    Pack years: 25.00    Types: Cigarettes    Last attempt to quit: 09/28/2015    Years since quitting: 2.2  . Smokeless tobacco: Never Used  . Tobacco comment: quit 09/27/15  Substance Use Topics  . Alcohol use: No  . Drug use: No    Family History  Problem Relation Age of Onset  . Breast cancer Mother   . Leukemia Father   . Stroke Sister   . Bone cancer Unknown     No Known Allergies  Objective: Vitals:   12/26/17 0844  BP: (!) 188/84  Pulse: 78  Weight: 123 lb (55.8 kg)   Body mass index is 22.5 kg/m.  Physical Exam  Constitutional: She is oriented to person, place, and time.  She is pleasant and in no distress.    Abdominal: Soft. She exhibits no distension. There is no tenderness.  Musculoskeletal:  Her left knee cap does seem to be more laterally displaced.  There is erythema over the lateral aspect of her knee with a small ulcer that is new.  She says that a scab there came off recently.  There is no fluctuance or drainage.  Her incision is fully healed.  Neurological: She is alert and oriented to person, place, and  time.  Skin: No rash noted.  Psychiatric: She has a normal mood and affect.    Lab Results    Problem List Items Addressed This Visit      High   Chronic infection of prosthetic knee ([place])    I talked to them again today about the uncertainty over whether the MRSA infection has been cured or is simply being suppressed by chronic antibiotic therapy.  After much discussion she decided that she would stop both doxycycline and rifampin after she finishes her current supply in about 1 month.  I asked them to contact me immediately if she has any signs of recurrent infection between now and her follow-up visit in 3 months.      Relevant Medications   rifampin (RIFADIN) 300 MG capsule       Michel Bickers, MD [facility] for [facility]ious [place] [phone] pager   [phone] cell 12/26/2017, 9:11 AM

## 2017-12-26 NOTE — Assessment & Plan Note (Signed)
I talked to them again today about the uncertainty over whether the MRSA infection has been cured or is simply being suppressed by chronic antibiotic therapy.  After much discussion she decided that she would stop both doxycycline and rifampin after she finishes her current supply in about 1 month.  I asked them to contact me immediately if she has any signs of recurrent infection between now and her follow-up visit in 3 months.

## 2018-01-14 ENCOUNTER — Telehealth (INDEPENDENT_AMBULATORY_CARE_PROVIDER_SITE_OTHER): Payer: Self-pay | Admitting: Orthopedic Surgery

## 2018-01-14 NOTE — Telephone Encounter (Signed)
[facility] records faxed to [facility]

## 2018-01-21 NOTE — Progress Notes (Signed)
Cardiology Office Note   Date:  01/22/2018   ID:  [name], DOB 1941-06-06, MRN [id number]  PCP:  Darreld Mclean, MD  Cardiologist:  [facility]  Chief Complaint  Patient presents with  . Follow-up  . Hypertension     History of Present Illness: [name] is a 77 y.o. female who presents for ongoing assessment and management of CAD, PCI to the left circumflex and RCA with drug-eluting stents in August 2008, carotid artery disease, hypertension, hyperlipidemia, and dilated cardiomyopathy.  Patient also has a history of moderate right and severe left common iliac stenosis, followed by Dr. Fletcher Anon.  Patient's most recent echocardiogram September 2017 revealed normal LV systolic function of 25% to 60% with grade 1 diastolic dysfunction, mild LAE.  Other noncardiac history includes GI bleed in July 2017 related to gastric AVM, which was ablated.  She was last seen in the office on 05/01/2017 by Almyra Deforest, PA.  At that time she was complaining of shortness of breath and difficulty ambulating due to left knee issue.  The patient was scheduled for carotid duplex study for ongoing surveillance of carotid artery disease.  No medication changes were made and she was to follow-up for ongoing cardiology office visits in 6 months.  Carotid ultrasound did not reveal any occlusive disease.   She comes today because her PCP has been noticing that her BP has been elevated persistently. She states that she takes her BP at home and finds it to be elevated as well. She has been taking NSAID's for arthritis pain in her knee and is to see orthopedic physician today for further evaluation. She denies chest pain, blurred vision, palpitations, but is complaining of fatigue.   Past Medical History:  Diagnosis Date  . Arthritis   . CAD (coronary artery disease)    a. 02/2007 s/p PCI/DES ot LCX and RCA;  b. 07/2014 MV: no ischemia/infarct, EF 68%.  . Carotid arterial disease ([place])    a. 05/2015 Carotid U/S:  RICA 0-53%, LICA 97-67%, RECA 3-41%, LECA >50% - overall stable, f/u 1 yr.  . Complication of anesthesia    Three days after procedure pt statrted to suffer with memoy loss that took about 4-5 days to come back according to [name]."  . Dilated cardiomyopathy ([place])    a. 05/2005 Echo: EF 65%; b. 01/22/2016 Echo: EF 25-30% (in setting of admission for urosepsis w/ elev trop); c. 01/26/2016 Echo: EF 35-40%, inf, distal apical, apical, and mid to distal septal HK-->felt to be 2/2 stress induced CM.  [name] Heart murmur   . History of bronchitis   . Hyperlipidemia   . Hypertension   . Hypertensive heart disease   . Memory impairment   . MRSA (methicillin resistant staph aureus) culture positive   . Nephrolithiasis    a. 12/2015 UVJ stone w/ hydroureteronephrosis req nephrostomy tube placement.  . Osteoarthritis    a. 03/3789 s/p L TKA complicated by infections req skin grafting.  [name] PVD (peripheral vascular disease) ([place])    a. Bilat common iliac dzs, nl ABI's --> med rx.  . Sepsis ([place])    a. 12/2015 admitted with Proteus mirabilis urosepsis/bacteremia.  . Wears glasses     Past Surgical History:  Procedure Laterality Date  . ABDOMINAL HYSTERECTOMY    . APPLICATION OF A-CELL OF EXTREMITY Left 12/08/2015   Procedure: APPLICATION OF A-CELL OF knee;  Surgeon: Loel Lofty [name], DO;  Location: [place];  Service: Plastics;  Laterality: Left;  .  APPLICATION OF WOUND VAC Left 11/16/2015   Procedure: APPLICATION OF WOUND VAC;  Surgeon: Loel Lofty [name], DO;  Location: [place];  Service: Plastics;  Laterality: Left;  . APPLICATION OF WOUND VAC Left 12/08/2015   Procedure: APPLICATION OF WOUND VAC  AND CAST to knee;  Surgeon: Loel Lofty [name], DO;  Location: [place];  Service: Plastics;  Laterality: Left;  . COLONOSCOPY    . CORONARY ANGIOPLASTY  07   3 stents placed  . ESOPHAGOGASTRODUODENOSCOPY    . EYE SURGERY Bilateral    cataracts  . FREE FLAP TO EXTREMITY Left 11/16/2015   Procedure: FREE FLAP  TO EXTREMITY;  Surgeon: Loel Lofty [name], DO;  Location: [place];  Service: Plastics;  Laterality: Left;  . I&D EXTREMITY Left 10/18/2015   Procedure: Left Knee Washout, Reclosure;  Surgeon: Newt Minion, MD;  Location: [place];  Service: Orthopedics;  Laterality: Left;  . I&D EXTREMITY Left 12/08/2015   Procedure: IRRIGATION AND DEBRIDEMENT knee;  Surgeon: Loel Lofty [name], DO;  Location: [place];  Service: Plastics;  Laterality: Left;  . I&D KNEE WITH POLY EXCHANGE Left 10/12/2015   Procedure: Irrigation and Debridement , Poly Exchange, Antibiotic Beads Left Knee;  Surgeon: Newt Minion, MD;  Location: [place];  Service: Orthopedics;  Laterality: Left;  . INCISION AND DRAINAGE OF WOUND Left 01/04/2016   Procedure: DEBRIDEMENT OF LEFT KNEE WOUND WITH ACELL AND WOUND VAC PLACEMENT;  Surgeon: Loel Lofty [name], DO;  Location: [place];  Service: Plastics;  Laterality: Left;  . IR GENERIC HISTORICAL  05/11/2016   IR FLUORO GUIDE CV MIDLINE PICC RIGHT 05/11/2016 Sandi Mariscal, MD [facility]INTER[facility]  . IR GENERIC HISTORICAL  05/11/2016   IR US GUIDE VASC ACCESS RIGHT 05/11/2016 Sandi Mariscal, MD [facility]  . KNEE CLOSED REDUCTION Left 01/16/2017   Procedure: CLOSED MANIPULATION LEFT KNEE;  Surgeon: Leandrew Koyanagi, MD;  Location: [place];  Service: Orthopedics;  Laterality: Left;  . KNEE SURGERY     denies  . SKIN SPLIT GRAFT Left 11/16/2015   Procedure: LEFT GASTROC MUSCLE FLAP WITH SKIN GRAFT SPLIT THICKNESS AND VAC PLACEMENT;  Surgeon: Loel Lofty [name], DO;  Location: [place];  Service: Plastics;  Laterality: Left;  . TOTAL HIP ARTHROPLASTY Right 09/10/2012   Dr Sharol Given  . TOTAL HIP ARTHROPLASTY Right 09/10/2012   Procedure: TOTAL HIP ARTHROPLASTY;  Surgeon: Newt Minion, MD;  Location: [place];  Service: Orthopedics;  Laterality: Right;  Right Total Hip Arthroplasty  . TOTAL HIP ARTHROPLASTY Left 06/27/2016   Procedure: LEFT TOTAL HIP ARTHROPLASTY ANTERIOR APPROACH;  Surgeon: Leandrew Koyanagi, MD;   Location: [place];  Service: Orthopedics;  Laterality: Left;  . TOTAL KNEE ARTHROPLASTY Left 09/28/2015   Procedure: TOTAL KNEE ARTHROPLASTY;  Surgeon: Newt Minion, MD;  Location: [place];  Service: Orthopedics;  Laterality: Left;     Current Outpatient Medications  Medication Sig Dispense Refill  . aspirin EC 81 MG tablet Take 81 mg by mouth daily.    [name] atorvastatin (LIPITOR) 40 MG tablet TAKE ONE TABLET BY MOUTH DAILY 90 tablet 0  . Calcium Carbonate-Vit D-Min (CALCIUM 1200 PO) Take by mouth.    . carvedilol (COREG) 3.125 MG tablet TAKE ONE TABLET BY MOUTH TWICE A DAY WITH A MEAL 180 tablet 10  . doxycycline (VIBRA-TABS) 100 MG tablet Take 1 tablet (100 mg total) by mouth 2 (two) times daily. 60 tablet 11  . furosemide (LASIX) 20 MG tablet TAKE ONE TABLET  BY MOUTH DAILY 90 tablet 5  . lisinopril (PRINIVIL,ZESTRIL) 10 MG tablet Take 1 tablet (10 mg total) by mouth daily. 90 tablet 3  . Magnesium Oxide (MAG-OXIDE) 200 MG TABS Take 1 tablet by mouth every morning.    . Multiple Vitamin (MULITIVITAMIN WITH MINERALS) TABS Take 1 tablet by mouth daily.    . naproxen sodium (ALEVE) 220 MG tablet Take 220 mg by mouth as needed.    . potassium chloride SA (K-DUR,KLOR-CON) 20 MEQ tablet TAKE ONE TABLET BY MOUTH DAILY 90 tablet 1  . Probiotic Product (PROBIOTIC DAILY PO) Take 1 capsule by mouth daily.    . rifampin (RIFADIN) 300 MG capsule Take 1 capsule (300 mg total) by mouth 2 (two) times daily. 60 capsule 11  . traMADol (ULTRAM) 50 MG tablet Take 1 tablet (50 mg total) by mouth every 6 (six) hours as needed. 30 tablet 2  . mesalamine (LIALDA) 1.2 g EC tablet Take 2.4 g by mouth daily with breakfast.     No current facility-administered medications for this visit.     Allergies:   Patient has no known allergies.    Social History:  The patient  reports that she quit smoking about 2 years ago. Her smoking use included cigarettes. She has a 25.00 pack-year smoking history. She has never used  smokeless tobacco. She reports that she does not drink alcohol or use drugs.   Family History:  The patient's family history includes Bone cancer in her unknown relative; Breast cancer in her mother; Leukemia in her father; Stroke in her sister.    ROS: All other systems are reviewed and negative. Unless otherwise mentioned in H&P    PHYSICAL EXAM: VS:  BP (!) 170/82   Pulse 84   Ht 5\' 2"  (1.575 m)   Wt 121 lb 12.8 oz (55.2 kg)   BMI 22.28 kg/m  , BMI Body mass index is 22.28 kg/m. GEN: Well nourished, well developed, in no acute distress  HEENT: normal  Neck: no JVD, carotid bruits, or masses Cardiac: RRR; no murmurs, rubs, or gallops,no edema  Respiratory:  Clear to auscultation bilaterally, normal work of breathing GI: soft, nontender, nondistended, + BS MS: no deformity or atrophy,with edema in the left knee and pain with ROM Skin: warm and dry, no rash Neuro:  Strength and sensation are intact Psych: euthymic mood, full affect   EKG:  Not completed during this office visit.   Recent Labs: No results found for requested labs within last 8760 hours.    Lipid Panel    Component Value Date/Time   CHOL 127 01/09/2012 0934   TRIG 107.0 01/09/2012 0934   HDL 39.20 01/09/2012 0934   CHOLHDL 3 01/09/2012 0934   VLDL 21.4 01/09/2012 0934   LDLCALC 66 01/09/2012 0934      Wt Readings from Last 3 Encounters:  01/22/18 121 lb 12.8 oz (55.2 kg)  12/26/17 123 lb (55.8 kg)  06/26/17 118 lb (53.5 kg)      Other studies Reviewed:  Echocardiogram 04/17/2016  Left ventricle: The cavity size was normal. Wall thickness was   normal. Systolic function was normal. The estimated ejection   fraction was in the range of 55% to 60%. Wall motion was normal;   there were no regional wall motion abnormalities. Doppler   parameters are consistent with abnormal left ventricular   relaxation (grade 1 diastolic dysfunction). - Aortic valve: There was mild regurgitation. - Mitral  valve: Calcified annulus. Mildly thickened leaflets . -  Left atrium: The atrium was mildly dilated.   NM Stress Test: 08/10/2014 Impressions:  - Normal LV systolic function; grade 1 diastolic dysfunction; mild   AI; mild LAE.  Impression Exercise Capacity:  Lexiscan with no exercise. BP Response:  Normal blood pressure response. Clinical Symptoms:  mild shortness of breath ECG Impression:  No significant ST segment change suggestive of ischemia. Comparison with Prior Nuclear Study: No images to compare  Overall Impression:  Normal stress nuclear study.  LV Wall Motion:  NL LV Function, EF 68%; NL Wall Motion  ASSESSMENT AND PLAN:  1. Hypertension: Has been elevated over the last few months. This may be multifactorial in the setting of aging, use of NSAID's and ongoing pain in her left knee. I will increase the lisinopril to 20 mg daily instead of coreg as I do not want to increase her fatigue which is associated with higher doses of BB. She will have a repeat BMET in one week for evaluation of kidney function on increased dose of ACE in conjunction with diuretic use. I will also repeat her echo for changes in LV function.   2. Carotid Artery Disease:  Recent carotid artery ultrasound was completed in 05/30/2017 and did not reveal occlusive disease.  3. Severe arthritis pain of the right knee: She is to see her orthopedic physician today. I have recommended that they ask about Voltaren Gel for topical pain relief.    Current medicines are reviewed at length with the patient today.    Labs/ tests ordered today include:  BMET and echo Phill Myron. [place] Pugh, ANP, AACC   01/22/2018 9:15 AM    [facility] [address], [place], [place] [address] Phone: [phone]; Fax: [phone]

## 2018-01-22 ENCOUNTER — Ambulatory Visit (INDEPENDENT_AMBULATORY_CARE_PROVIDER_SITE_OTHER): Payer: Medicare Other | Admitting: Adult Health

## 2018-01-22 ENCOUNTER — Encounter: Payer: Self-pay | Admitting: Adult Health

## 2018-01-22 VITALS — BP 170/82 | HR 84 | Ht 62.0 in | Wt 121.8 lb

## 2018-01-22 DIAGNOSIS — I110 Hypertensive heart disease with heart failure: Secondary | ICD-10-CM

## 2018-01-22 DIAGNOSIS — Z79899 Other long term (current) drug therapy: Secondary | ICD-10-CM | POA: Diagnosis not present

## 2018-01-22 DIAGNOSIS — M79605 Pain in left leg: Secondary | ICD-10-CM | POA: Diagnosis not present

## 2018-01-22 DIAGNOSIS — M25562 Pain in left knee: Secondary | ICD-10-CM | POA: Diagnosis not present

## 2018-01-22 DIAGNOSIS — N289 Disorder of kidney and ureter, unspecified: Secondary | ICD-10-CM

## 2018-01-22 NOTE — Patient Instructions (Signed)
Medication Instructions:  INCREASE LISINOPRIL 20MG  DAILY  If you need a refill on your cardiac medications before your next appointment, please call your pharmacy.  Labwork: BMET IN ONE WEEK HERE IN OUR OFFICE AT [facility]  Take the provided lab slips with you to the lab for your blood draw.   You will NOT need to fast   Testing/Procedures: Echocardiogram - Your physician has requested that you have an echocardiogram. Echocardiography is a painless test that uses sound waves to create images of your heart. It provides your doctor with information about the size and shape of your heart and how well your heart's chambers and valves are working. This procedure takes approximately one hour. There are no restrictions for this procedure. This will be performed at our [facility] location - [address], [address].  Follow-Up: Your physician wants you to follow-up in: 2 [place] BP CHECK Your physician wants you to follow-up in: [place] ([place]), DNP,AACC IF PRIMARY CARDIOLOGIST IS UNAVAILABLE.   Thank you for choosing [facility] at [facility]!!

## 2018-01-29 ENCOUNTER — Other Ambulatory Visit: Payer: Medicare Other | Admitting: *Deleted

## 2018-01-29 ENCOUNTER — Ambulatory Visit (HOSPITAL_COMMUNITY): Payer: Medicare Other | Attending: Internal Medicine

## 2018-01-29 ENCOUNTER — Other Ambulatory Visit: Payer: Self-pay

## 2018-01-29 DIAGNOSIS — I351 Nonrheumatic aortic (valve) insufficiency: Secondary | ICD-10-CM | POA: Insufficient documentation

## 2018-01-29 DIAGNOSIS — N289 Disorder of kidney and ureter, unspecified: Secondary | ICD-10-CM | POA: Diagnosis not present

## 2018-01-29 DIAGNOSIS — E785 Hyperlipidemia, unspecified: Secondary | ICD-10-CM | POA: Insufficient documentation

## 2018-01-29 DIAGNOSIS — I110 Hypertensive heart disease with heart failure: Secondary | ICD-10-CM | POA: Insufficient documentation

## 2018-01-29 DIAGNOSIS — Z79899 Other long term (current) drug therapy: Secondary | ICD-10-CM | POA: Diagnosis not present

## 2018-01-29 LAB — BASIC METABOLIC PANEL
BUN/Creatinine Ratio: 19 (ref 12–28)
BUN: 18 mg/dL (ref 8–27)
CALCIUM: 9.7 mg/dL (ref 8.7–10.3)
CHLORIDE: 102 mmol/L (ref 96–106)
CO2: 25 mmol/L (ref 20–29)
Creatinine, Ser: 0.95 mg/dL (ref 0.57–1.00)
GFR calc Af Amer: 67 mL/min/{1.73_m2} (ref >59)
GFR calc non Af Amer: 58 mL/min/{1.73_m2} — ABNORMAL LOW (ref >59)
Glucose: 77 mg/dL (ref 65–99)
POTASSIUM: 4.8 mmol/L (ref 3.5–5.2)
SODIUM: 140 mmol/L (ref 134–144)

## 2018-01-31 ENCOUNTER — Ambulatory Visit: Payer: Self-pay | Admitting: Orthopedic Surgery

## 2018-01-31 DIAGNOSIS — M25562 Pain in left knee: Secondary | ICD-10-CM | POA: Diagnosis not present

## 2018-02-05 ENCOUNTER — Other Ambulatory Visit: Payer: Self-pay | Admitting: Cardiology

## 2018-02-06 ENCOUNTER — Ambulatory Visit (INDEPENDENT_AMBULATORY_CARE_PROVIDER_SITE_OTHER): Payer: Medicare Other | Admitting: Pharmacist

## 2018-02-06 ENCOUNTER — Other Ambulatory Visit: Payer: Self-pay | Admitting: Cardiology

## 2018-02-06 VITALS — BP 158/80 | HR 76

## 2018-02-06 DIAGNOSIS — I10 Essential (primary) hypertension: Secondary | ICD-10-CM | POA: Diagnosis not present

## 2018-02-06 MED ORDER — LISINOPRIL 20 MG PO TABS: 20.0000 mg | ORAL_TABLET | Freq: Every day | ORAL | 4 refills | Status: DC

## 2018-02-06 MED ORDER — CARVEDILOL 6.25 MG PO TABS: 6.2500 mg | ORAL_TABLET | Freq: Two times a day (BID) | ORAL | 1 refills | Status: DC

## 2018-02-06 NOTE — Progress Notes (Signed)
Patient ID: [name]                 DOB: September 08, 1940                      MRN: [id number]     HPI: [name] is a 77 y.o. female patient of Dr [name] referred by Arnold Long NP to HTN clinic.  PMH includes CAD s/p PCI , hypertension, hyperlipidemia, and dilated cardiomyopathy with EF of 55%.  Patient parents to HTN clinic accompany bu her son. He helps her with her medication and blood pressure monitoring. and reports significant improvement on low extremity edema. Denies dizziness, chest pain, headaches , increased fatigueor palpitations.   Current HTN meds:  Carvedilol 3.125mg  twice daily Furosemide 20mg  daily Lisinopril 20mg  daily - increased 2 weeks ago  Intolerance:none   BP goal: 130/80  Family History: The patient's family history includes Bone cancer in her unknown relative; Breast cancer in her mother; Leukemia in her father; Stroke in her sister.  Social History: The patient  reports that she quit smoking about 2 years ago. Her smoking use included cigarettes. She has a 25.00 pack-year smoking history. She has never used smokeless tobacco. She reports that she does not drink alcohol or use drugs.  Exercise: minimal due to problems with knee joint  Home BP readings: no provided  Wt Readings from Last 3 Encounters:  01/22/18 121 lb 12.8 oz (55.2 kg)  12/26/17 123 lb (55.8 kg)  06/26/17 118 lb (53.5 kg)   BP Readings from Last 3 Encounters:  02/06/18 (!) 158/80  01/22/18 (!) 170/82  12/26/17 (!) 188/84   Pulse Readings from Last 3 Encounters:  02/06/18 76  01/22/18 84  12/26/17 78    Past Medical History:  Diagnosis Date  . Arthritis   . CAD (coronary artery disease)    a. 02/2007 s/p PCI/DES ot LCX and RCA;  b. 07/2014 MV: no ischemia/infarct, EF 68%.  . Carotid arterial disease ([place])    a. 05/2015 Carotid U/S: RICA 0-62%, LICA 37-62%, RECA 8-31%, LECA >50% - overall stable, f/u 1 yr.  . Complication of anesthesia    Three days after procedure pt  statrted to suffer with memoy loss that took about 4-5 days to come back according to [name]."  . Dilated cardiomyopathy ([place])    a. 05/2005 Echo: EF 65%; b. 01/22/2016 Echo: EF 25-30% (in setting of admission for urosepsis w/ elev trop); c. 01/26/2016 Echo: EF 35-40%, inf, distal apical, apical, and mid to distal septal HK-->felt to be 2/2 stress induced CM.  [name] Heart murmur   . History of bronchitis   . Hyperlipidemia   . Hypertension   . Hypertensive heart disease   . Memory impairment   . MRSA (methicillin resistant staph aureus) culture positive   . Nephrolithiasis    a. 12/2015 UVJ stone w/ hydroureteronephrosis req nephrostomy tube placement.  . Osteoarthritis    a. 11/1759 s/p L TKA complicated by infections req skin grafting.  [name] PVD (peripheral vascular disease) ([place])    a. Bilat common iliac dzs, nl ABI's --> med rx.  . Sepsis ([place])    a. 12/2015 admitted with Proteus mirabilis urosepsis/bacteremia.  . Wears glasses     Current Outpatient Medications on File Prior to Visit  Medication Sig Dispense Refill  . aspirin EC 81 MG tablet Take 81 mg by mouth daily.    [name] atorvastatin (LIPITOR) 40 MG tablet TAKE ONE  TABLET BY MOUTH DAILY 90 tablet 1  . Calcium Carbonate-Vit D-Min (CALCIUM 1200 PO) Take by mouth.    . furosemide (LASIX) 20 MG tablet TAKE ONE TABLET BY MOUTH DAILY 90 tablet 5  . Magnesium Oxide (MAG-OXIDE) 200 MG TABS Take 1 tablet by mouth every morning.    . Multiple Vitamin (MULITIVITAMIN WITH MINERALS) TABS Take 1 tablet by mouth daily.    . naproxen sodium (ALEVE) 220 MG tablet Take 220 mg by mouth as needed.    . potassium chloride SA (K-DUR,KLOR-CON) 20 MEQ tablet TAKE ONE TABLET BY MOUTH DAILY 90 tablet 1  . Probiotic Product (PROBIOTIC DAILY PO) Take 1 capsule by mouth daily.    . mesalamine (LIALDA) 1.2 g EC tablet Take 2.4 g by mouth daily with breakfast.    . traMADol (ULTRAM) 50 MG tablet Take 1 tablet (50 mg total) by mouth every 6 (six) hours as needed.  (Patient not taking: Reported on 02/06/2018) 30 tablet 2   No current facility-administered medications on file prior to visit.     No Known Allergies  Blood pressure (!) 158/80, pulse 76.  Essential hypertension Blood pressure much improved from previous readying but remains above goal of 130/80. Patient is tolerating current therapy and renal function remains stable after increasing lisinopril from 10mg  to 20mg  only 2 weeks ago. Will increase carvedilol to 6.25mg  twice daily, and follow up in 2 weeks. Will consider increasing lisinopril to 30mg  daily if additional BP control needed during follow up visit.    Tomika Eckles Rodriguez-Guzman PharmD, BCPS, [place] [id number] 02/07/2018 10:57 AM

## 2018-02-06 NOTE — Patient Instructions (Addendum)
Return for a  follow up appointment in 3 weeks Vilinda Flake)  Check your blood pressure at home daily (if able) and keep record of the readings.  Take your BP meds as follows: *INCREASE carvedilol dose to 6.25mg  twice dialy* (okay to take 2 tablets of 3.125mg  until gone)  Bring all of your meds, your BP cuff and your record of home blood pressures to your next appointment.  Exercise as you're able, try to walk approximately 30 minutes per day.  Keep salt intake to a minimum, especially watch canned and prepared boxed foods.  Eat more fresh fruits and vegetables and fewer canned items.  Avoid eating in fast food restaurants.    HOW TO TAKE YOUR BLOOD PRESSURE: . Rest 5 minutes before taking your blood pressure. .  Don't smoke or drink caffeinated beverages for at least 30 minutes before. . Take your blood pressure before (not after) you eat. . Sit comfortably with your back supported and both feet on the floor (don't cross your legs). . Elevate your arm to heart level on a table or a desk. . Use the proper sized cuff. It should fit smoothly and snugly around your bare upper arm. There should be enough room to slip a fingertip under the cuff. The bottom edge of the cuff should be 1 inch above the crease of the elbow. . Ideally, take 3 measurements at one sitting and record the average.

## 2018-02-07 ENCOUNTER — Encounter: Payer: Self-pay | Admitting: Pharmacist

## 2018-02-07 NOTE — Assessment & Plan Note (Signed)
Blood pressure much improved from previous readying but remains above goal of 130/80. Patient is tolerating current therapy and renal function remains stable after increasing lisinopril from 10mg  to 20mg  only 2 weeks ago. Will increase carvedilol to 6.25mg  twice daily, and follow up in 2 weeks. Will consider increasing lisinopril to 30mg  daily if additional BP control needed during follow up visit.

## 2018-03-02 NOTE — Progress Notes (Signed)
Cardiology Office Note   Date:  03/03/2018   ID:  [name], DOB 14-Feb-1941, MRN [id number]  PCP:  Courtney Mclean, MD  Cardiologist: Dr. [name] Chief Complaint  Patient presents with  . Hypertension     History of Present Illness: [name] is a 77 y.o. female who presents for ongoing assessment and management of coronary artery disease, history of PCI to the left circumflex and RCA with drug-eluting stents 03/2007, history of CAD, hypertension, hyperlipidemia, and dilated cardiomyopathy.  Other history includes PAD with moderate right and severe left common iliac stenosis followed by Dr. Fletcher Anon.  Other noncardiac history includes GI bleed in July 2017 related to gastric AVM which was ablated.  On last office visit on 01/22/2018 she was seen due to elevated blood pressure that was persistent per her PCP.  Patient has been on NSAIDs for arthritis pain in her knee and is also being followed by her orthopedist.  Hypertension was felt to be related to both pain and NSAIDs.  Lisinopril was increased to 20 mg daily.  Coreg was not changed to avoid worsening fatigue.  Echocardiogram and BMET were ordered for evaluation of LV function and for renal status.  The patient was seen on 02/06/2018 by Cecilie Kicks, Pharm.D. at that time blood pressure was significantly improved, at 158/80, but not optimal.  Carvedilol was increased to 6.25 mg twice daily with follow-up in 2 weeks for further evaluation of her response.  Follow-up BMET was normal.  Echo revealed normal LV systolic function with moderate LVH.  Patient was found to have grade 1 diastolic dysfunction.  Aortic valve did reveal some calcifications with restriction leaf motion.  Mean gradient 10 mmHg.  She comes today complaining that her BP is not well controlled. Her son provides her with her BP medications and she is uncertain what she is taking daily. BP is 117/96 today despite increase in coreg dose from 3.125 mg BID to 6.25 mg  BID.  She states she is in a lot of pain from arthritic knee, walking back to the exam room.   Past Medical History:  Diagnosis Date  . Arthritis   . CAD (coronary artery disease)    a. 02/2007 s/p PCI/DES ot LCX and RCA;  b. 07/2014 MV: no ischemia/infarct, EF 68%.  . Carotid arterial disease ([place])    a. 05/2015 Carotid U/S: RICA 6-76%, LICA 19-50%, RECA 9-32%, LECA >50% - overall stable, f/u 1 yr.  . Complication of anesthesia    Three days after procedure pt statrted to suffer with memoy James that took about 4-5 days to come back according to pt son [name]."  . Dilated cardiomyopathy ([place])    a. 05/2005 Echo: EF 65%; b. 01/22/2016 Echo: EF 25-30% (in setting of admission for urosepsis w/ elev trop); c. 01/26/2016 Echo: EF 35-40%, inf, distal apical, apical, and mid to distal septal HK-->felt to be 2/2 stress induced CM.  [name] Heart murmur   . History of bronchitis   . Hyperlipidemia   . Hypertension   . Hypertensive heart disease   . Memory impairment   . MRSA (methicillin resistant staph aureus) culture positive   . Nephrolithiasis    a. 12/2015 UVJ stone w/ hydroureteronephrosis req nephrostomy tube placement.  . Osteoarthritis    a. 12/7122 s/p L TKA complicated by infections req skin grafting.  [name] PVD (peripheral vascular disease) ([place])    a. Bilat common iliac dzs, nl ABI's --> med rx.  . Sepsis ([place])  a. 12/2015 admitted with Proteus mirabilis urosepsis/bacteremia.  . Wears glasses     Past Surgical History:  Procedure Laterality Date  . ABDOMINAL HYSTERECTOMY    . APPLICATION OF A-CELL OF EXTREMITY Left 12/08/2015   Procedure: APPLICATION OF A-CELL OF knee;  Surgeon: Loel Lofty [name], DO;  Location: [facility];  Service: Plastics;  Laterality: Left;  . APPLICATION OF WOUND VAC Left 11/16/2015   Procedure: APPLICATION OF WOUND VAC;  Surgeon: Loel Lofty [name], DO;  Location: [facility];  Service: Plastics;  Laterality: Left;  . APPLICATION OF WOUND VAC Left 12/08/2015   Procedure:  APPLICATION OF WOUND VAC  AND CAST to knee;  Surgeon: Loel Lofty [name], DO;  Location: [facility];  Service: Plastics;  Laterality: Left;  . COLONOSCOPY    . CORONARY ANGIOPLASTY  07   3 stents placed  . ESOPHAGOGASTRODUODENOSCOPY    . EYE SURGERY Bilateral    cataracts  . FREE FLAP TO EXTREMITY Left 11/16/2015   Procedure: FREE FLAP TO EXTREMITY;  Surgeon: Loel Lofty [name], DO;  Location: [facility];  Service: Plastics;  Laterality: Left;  . I&D EXTREMITY Left 10/18/2015   Procedure: Left Knee Washout, Reclosure;  Surgeon: Newt Minion, MD;  Location: [facility];  Service: Orthopedics;  Laterality: Left;  . I&D EXTREMITY Left 12/08/2015   Procedure: IRRIGATION AND DEBRIDEMENT knee;  Surgeon: Loel Lofty [name], DO;  Location: [facility];  Service: Plastics;  Laterality: Left;  . I&D KNEE WITH POLY EXCHANGE Left 10/12/2015   Procedure: Irrigation and Debridement , Poly Exchange, Antibiotic Beads Left Knee;  Surgeon: Newt Minion, MD;  Location: [facility];  Service: Orthopedics;  Laterality: Left;  . INCISION AND DRAINAGE OF WOUND Left 01/04/2016   Procedure: DEBRIDEMENT OF LEFT KNEE WOUND WITH ACELL AND WOUND VAC PLACEMENT;  Surgeon: Loel Lofty [name], DO;  Location: [facility];  Service: Plastics;  Laterality: Left;  . IR GENERIC HISTORICAL  05/11/2016   IR FLUORO GUIDE CV MIDLINE PICC RIGHT 05/11/2016 Sandi Mariscal, MD [facility]  . IR GENERIC HISTORICAL  05/11/2016   IR US GUIDE VASC ACCESS RIGHT 05/11/2016 Sandi Mariscal, MD [facility]  . KNEE CLOSED REDUCTION Left 01/16/2017   Procedure: CLOSED MANIPULATION LEFT KNEE;  Surgeon: Leandrew Koyanagi, MD;  Location: [facility];  Service: Orthopedics;  Laterality: Left;  . KNEE SURGERY     denies  . SKIN SPLIT GRAFT Left 11/16/2015   Procedure: LEFT GASTROC MUSCLE FLAP WITH SKIN GRAFT SPLIT THICKNESS AND VAC PLACEMENT;  Surgeon: Loel Lofty [name], DO;  Location: [facility];  Service: Plastics;  Laterality: Left;  . TOTAL HIP ARTHROPLASTY Right 09/10/2012    Dr Sharol Given  . TOTAL HIP ARTHROPLASTY Right 09/10/2012   Procedure: TOTAL HIP ARTHROPLASTY;  Surgeon: Newt Minion, MD;  Location: [facility];  Service: Orthopedics;  Laterality: Right;  Right Total Hip Arthroplasty  . TOTAL HIP ARTHROPLASTY Left 06/27/2016   Procedure: LEFT TOTAL HIP ARTHROPLASTY ANTERIOR APPROACH;  Surgeon: Leandrew Koyanagi, MD;  Location: [facility];  Service: Orthopedics;  Laterality: Left;  . TOTAL KNEE ARTHROPLASTY Left 09/28/2015   Procedure: TOTAL KNEE ARTHROPLASTY;  Surgeon: Newt Minion, MD;  Location: [facility];  Service: Orthopedics;  Laterality: Left;     Current Outpatient Medications  Medication Sig Dispense Refill  . aspirin EC 81 MG tablet Take 81 mg by mouth daily.    [name] atorvastatin (LIPITOR) 40 MG tablet TAKE ONE TABLET BY MOUTH DAILY 90 tablet 1  . Calcium  Carbonate-Vit D-Min (CALCIUM 1200 PO) Take by mouth.    . carvedilol (COREG) 6.25 MG tablet Take 1 tablet (6.25 mg total) by mouth 2 (two) times daily with a meal. 60 tablet 1  . doxycycline (VIBRA-TABS) 100 MG tablet Take 100 mg by mouth 2 (two) times daily.    . furosemide (LASIX) 20 MG tablet TAKE ONE TABLET BY MOUTH DAILY 90 tablet 5  . lisinopril (PRINIVIL,ZESTRIL) 20 MG tablet Take 1 tablet (20 mg total) by mouth daily. 30 tablet 4  . Magnesium Oxide (MAG-OXIDE) 200 MG TABS Take 1 tablet by mouth every morning.    . Multiple Vitamin (MULITIVITAMIN WITH MINERALS) TABS Take 1 tablet by mouth daily.    . naproxen sodium (ALEVE) 220 MG tablet Take 220 mg by mouth as needed.    . potassium chloride SA (K-DUR,KLOR-CON) 20 MEQ tablet TAKE ONE TABLET BY MOUTH DAILY 90 tablet 3  . Probiotic Product (PROBIOTIC DAILY PO) Take 1 capsule by mouth daily.    . rifampin (RIFADIN) 300 MG capsule Take 300 mg by mouth 2 (two) times daily.    . mesalamine (LIALDA) 1.2 g EC tablet Take 2.4 g by mouth daily with breakfast.     No current facility-administered medications for this visit.     Allergies:   Patient has no known allergies.      Social History:  The patient  reports that she quit smoking about 2 years ago. Her smoking use included cigarettes. She has a 25.00 pack-year smoking history. She has never used smokeless tobacco. She reports that she does not drink alcohol or use drugs.   Family History:  The patient's family history includes Bone cancer in her unknown relative; Breast cancer in her mother; Leukemia in her father; Stroke in her sister.    ROS: All other systems are reviewed and negative. Unless otherwise mentioned in H&P    PHYSICAL EXAM: VS:  BP (!) 177/92   Pulse 77   Ht 5\' 2"  (1.575 m)   Wt 122 lb 9.6 oz (55.6 kg)   BMI 22.42 kg/m  , BMI Body mass index is 22.42 kg/m. GEN: Well nourished, well developed, in no acute distress  HEENT: normal  Neck: no JVD, carotid bruits, or masses Cardiac: RRR; 1/6 systolic murmurs, rubs, or gallops,no edema  Respiratory: Clear to auscultation bilaterally, normal work of breathing GI: soft, nontender, nondistended, + BS MS: no deformity or atrophy Brace to let knee.  Skin: warm and dry, no rash Neuro:  Strength and sensation are intact Psych: euthymic mood, full affect   EKG:  NSR rate of  77 bpm.  Recent Labs: 02/22/2018: BUN 18; Creatinine, Ser 0.95; Potassium 4.8; Sodium 140    Lipid Panel    Component Value Date/Time   CHOL 127 01/09/2012 0934   TRIG 107.0 01/09/2012 0934   HDL 39.20 01/09/2012 0934   CHOLHDL 3 01/09/2012 0934   VLDL 21.4 01/09/2012 0934   LDLCALC 66 01/09/2012 0934      Wt Readings from Last 3 Encounters:  03/03/18 122 lb 9.6 oz (55.6 kg)  01/22/18 121 lb 12.8 oz (55.2 kg)  12/26/17 123 lb (55.8 kg)      Other studies Reviewed: Echocardiogram Feb 22, 2018 Left ventricle: The cavity size was normal. Wall thickness was   increased in a pattern of moderate LVH. Systolic function was   normal. The estimated ejection fraction was in the range of 60%   to 65%. Wall motion was normal; there were no regional wall  motion  abnormalities. Doppler parameters are consistent with   abnormal left ventricular relaxation (grade 1 diastolic   dysfunction). The E/e&' ratio is between 8-15, suggesting   indeterminate LV filling pressure. - Aortic valve: Calcified with restricted leaflet motion. Mild   stenosis. There was mild regurgitation. Mean gradient (S): 10 mm   Hg. Valve area (VTI): 2.08 cm^2. Valve area (Vmax): 1.98 cm^2.   Valve area (Vmean): 1.99 cm^2. - Mitral valve: Calcified annulus. Mildly thickened leaflets .   There was trivial regurgitation. - Left atrium: The atrium was normal in size. - Right ventricle: The cavity size was mildly dilated. Mildly   reduced systolic function. - Right atrium: The atrium was normal in size. Mobile mass in the   RA - consider thrombus versus prominent eustacian valve. - Tricuspid valve: There was trivial regurgitation. - Pulmonary arteries: PA peak pressure: 24 mm Hg (S). - Inferior vena cava: The vessel was normal in size. The   respirophasic diameter changes were in the normal range (= 50%),   consistent with normal central venous pressure.   ASSESSMENT AND PLAN:  1. Hypertension: BP is elevated on first check, I checked it again in the exam room: 158/92. She is not certain that she is taking the higher dose of coreg as directed I have reviewed her BP recordings and see that they are high as well. Her BP machine is a year old and she has not changed the batteries. If BP medications are being taken as directed would increase coreg to  9.375 mg BID.   2. Chronic Arthritis Pain: Taking NSAIDs which may be contributing along with worsening pain   Current medicines are reviewed at length with the patient today.    Labs/ tests ordered today include: None  Phill Myron. West Pugh, ANP, AACC   03/03/2018 12:20 PM    [facility] [address] [place], [place] [address] Phone: [phone]; Fax: [phone]

## 2018-03-03 ENCOUNTER — Encounter: Payer: Self-pay | Admitting: Adult Health

## 2018-03-03 ENCOUNTER — Telehealth: Payer: Self-pay | Admitting: Adult Health

## 2018-03-03 ENCOUNTER — Ambulatory Visit (INDEPENDENT_AMBULATORY_CARE_PROVIDER_SITE_OTHER): Payer: Medicare Other | Admitting: Adult Health

## 2018-03-03 VITALS — BP 177/92 | HR 77 | Ht 62.0 in | Wt 122.6 lb

## 2018-03-03 DIAGNOSIS — I10 Essential (primary) hypertension: Secondary | ICD-10-CM | POA: Diagnosis not present

## 2018-03-03 DIAGNOSIS — I358 Other nonrheumatic aortic valve disorders: Secondary | ICD-10-CM | POA: Diagnosis not present

## 2018-03-03 NOTE — Patient Instructions (Addendum)
Medication Instructions:  NO CHANGES- Your physician recommends that you continue on your current medications as directed. Please refer to the Current Medication list given to you today.  If you need a refill on your cardiac medications before your next appointment, please call your pharmacy.  Special Instructions: PLEASE MAKE SURE THAT YOU ARE TAKING YOUR CARVEDILOL 6.25MG  TWICE DAILY, PLEASE CALL IF YOU NEED FURTHER DIRECTION   Follow-Up: Your physician wants you to follow-up in: 2 [place] BP CHECK  Your physician wants you to follow-up in: [place] ([place]), DNP,AACC IF PRIMARY CARDIOLOGIST IS UNAVAILABLE.    Thank you for choosing [facility] at [facility]!!       How to Take Your Blood Pressure You can take your blood pressure at home with a machine. You may need to check your blood pressure at home:  To check if you have high blood pressure (hypertension).  To check your blood pressure over time.  To make sure your blood pressure medicine is working.  Supplies needed: You will need a blood pressure machine, or monitor. You can buy one at a drugstore or online. When choosing one:  Choose one with an arm cuff.  Choose one that wraps around your upper arm. Only one finger should fit between your arm and the cuff.  Do not choose one that measures your blood pressure from your wrist or finger.  Your doctor can suggest a monitor. How to prepare Avoid these things for 30 minutes before checking your blood pressure:  Drinking caffeine.  Drinking alcohol.  Eating.  Smoking.  Exercising.  Five minutes before checking your blood pressure:  Pee.  Sit in a dining chair. Avoid sitting in a soft couch or armchair.  Be quiet. Do not talk.  How to take your blood pressure Follow the instructions that came with your machine. If you have a digital blood pressure monitor, these may be the  instructions: 1. Sit up straight. 2. Place your feet on the floor. Do not cross your ankles or legs. 3. Rest your left arm at the level of your heart. You may rest it on a table, desk, or chair. 4. Pull up your shirt sleeve. 5. Wrap the blood pressure cuff around the upper part of your left arm. The cuff should be 1 inch (2.5 cm) above your elbow. It is best to wrap the cuff around bare skin. 6. Fit the cuff snugly around your arm. You should be able to place only one finger between the cuff and your arm. 7. Put the cord inside the groove of your elbow. 8. Press the power button. 9. Sit quietly while the cuff fills with air and loses air. 10. Write down the numbers on the screen. 11. Wait 2-3 minutes and then repeat steps 1-10.  What do the numbers mean? Two numbers make up your blood pressure. The first number is called systolic pressure. The second is called diastolic pressure. An example of a blood pressure reading is "120 over 80" (or 120/80). If you are an adult and do not have a medical condition, use this guide to find out if your blood pressure is normal: Normal  First number: below 120.  Second number: below 80. Elevated  First number: 120-129.  Second number: below 80. Hypertension stage 1  First number: 130-139.  Second number: 80-89. Hypertension stage 2  First number: 140 or above.  Second number: 52 or above. Your blood pressure  is above normal even if only the top or bottom number is above normal. Follow these instructions at home:  Check your blood pressure as often as your doctor tells you to.  Take your monitor to your next doctor's appointment. Your doctor will: ? Make sure you are using it correctly. ? Make sure it is working right.  Make sure you understand what your blood pressure numbers should be.  Tell your doctor if your medicines are causing side effects. Contact a doctor if:  Your blood pressure keeps being high. Get help right away  if:  Your first blood pressure number is higher than 180.  Your second blood pressure number is higher than 120. This information is not intended to replace advice given to you by your health care provider. Make sure you discuss any questions you have with your health care provider. Document Released: 06/28/2008 Document Revised: 06/13/2016 Document Reviewed: 12/23/2015 Elsevier Interactive Patient Education  [employer].

## 2018-03-03 NOTE — Telephone Encounter (Signed)
New Message:     FYI:  Pt states she would like to let [facility] Nails know that she is taking the Carvedilol at the dosage that is prescribed

## 2018-03-05 ENCOUNTER — Telehealth (INDEPENDENT_AMBULATORY_CARE_PROVIDER_SITE_OTHER): Payer: Self-pay | Admitting: Orthopaedic Surgery

## 2018-03-05 NOTE — Telephone Encounter (Signed)
Okay to submit new request for [place]?

## 2018-03-05 NOTE — Telephone Encounter (Signed)
yes

## 2018-03-05 NOTE — Telephone Encounter (Signed)
Patient requesting another monovisc injection in her right knee. She was told she could get it every 6 months but just wants to make sure her insurance will cover it. Patients # [phone]

## 2018-03-05 NOTE — Telephone Encounter (Signed)
PLEASE SUBMIT FOR  [place] INJ-DR Erlinda Hong

## 2018-03-11 ENCOUNTER — Telehealth (INDEPENDENT_AMBULATORY_CARE_PROVIDER_SITE_OTHER): Payer: Self-pay

## 2018-03-11 NOTE — Telephone Encounter (Signed)
Noted  

## 2018-03-11 NOTE — Telephone Encounter (Signed)
Submitted for Monovisc, right knee. 

## 2018-03-18 ENCOUNTER — Ambulatory Visit (INDEPENDENT_AMBULATORY_CARE_PROVIDER_SITE_OTHER): Payer: Medicare Other | Admitting: Pharmacist Clinician (PhC)/ Clinical Pharmacy Specialist

## 2018-03-18 ENCOUNTER — Telehealth (INDEPENDENT_AMBULATORY_CARE_PROVIDER_SITE_OTHER): Payer: Self-pay

## 2018-03-18 VITALS — BP 148/80 | HR 80

## 2018-03-18 DIAGNOSIS — I10 Essential (primary) hypertension: Secondary | ICD-10-CM

## 2018-03-18 MED ORDER — CARVEDILOL 12.5 MG PO TABS: 12.5000 mg | ORAL_TABLET | Freq: Two times a day (BID) | ORAL | 5 refills | Status: DC

## 2018-03-18 MED ORDER — LISINOPRIL 40 MG PO TABS: 40.0000 mg | ORAL_TABLET | Freq: Every day | ORAL | 3 refills | Status: DC

## 2018-03-18 NOTE — Progress Notes (Signed)
Patient ID: [name]                 DOB: 1940/09/29                      MRN: [id number]     HPI: [name] is a 77 y.o. female patient of Dr [name] referred by Arnold Long NP to HTN clinic.  PMH includes CAD s/p PCI , hypertension, hyperlipidemia, and dilated cardiomyopathy with EF of 55%.  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  Because of the chronic pain in her knee she uses naproxen 220 mg bid.  Without this dose she notes an increase in pain/discomfort and is not willing to discontinue use.    She saw Jory Sims 2 weeks ago and at that time her carvedilol was increased from 6.25 mg to 12.5 mg twice daily.  She has had no problems with this.  She is here today with her daughter-in-law.    Current HTN meds:  Carvedilol 12.5 mg twice daily Furosemide 20mg  daily Lisinopril 20mg  daily - increased end of June  Intolerance:none   BP goal: 130/80  Family History: The patient's family history includes Bone cancer in her unknown relative; Breast cancer in her mother; Leukemia in her father; Stroke in her sister.  Social History: The patient  reports that she quit smoking about 2 years ago. Her smoking use included cigarettes. She has a 25.00 pack-year smoking history. She has never used smokeless tobacco. She reports that she does not drink alcohol or use drugs.  Coffee and sweet tea 3-5 cups of coffee per day; 2-3 glasses sweet tea per day, occasional cola; also drinks plenty of water (2 full 20oz cups)  Diet:  Does add salt to meats, eggs; eats mostly home cooked meals  Exercise: minimal due to problems with knee joint  Home BP readings: 30 readings from past month show average 167/93 with range of 141-192/83-107.  Only 2 readings were < 751 systolic.    Wt Readings from Last 3 Encounters:  03/03/18 122 lb 9.6 oz (55.6 kg)  01/22/18 121 lb 12.8 oz (55.2 kg)  12/26/17 123 lb (55.8 kg)   BP Readings from  Last 3 Encounters:  03/19/18 (!) 148/80  03/03/18 (!) 177/92  02/06/18 (!) 158/80   Pulse Readings from Last 3 Encounters:  03/19/18 80  03/03/18 77  02/06/18 76    Past Medical History:  Diagnosis Date  . Arthritis   . CAD (coronary artery disease)    a. 02/2007 s/p PCI/DES ot LCX and RCA;  b. 07/2014 MV: no ischemia/infarct, EF 68%.  . Carotid arterial disease ([place])    a. 05/2015 Carotid U/S: RICA 0-25%, LICA 85-27%, RECA 7-82%, LECA >50% - overall stable, f/u 1 yr.  . Complication of anesthesia    Three days after procedure pt statrted to suffer with memoy loss that took about 4-5 days to come back according to [name]."  . Dilated cardiomyopathy ([place])    a. 05/2005 Echo: EF 65%; b. 01/22/2016 Echo: EF 25-30% (in setting of admission for urosepsis w/ elev trop); c. 01/26/2016 Echo: EF 35-40%, inf, distal apical, apical, and mid to distal septal HK-->felt to be 2/2 stress induced CM.  [name] Heart murmur   . History of bronchitis   . Hyperlipidemia   . Hypertension   . Hypertensive heart disease   . Memory impairment   .  MRSA (methicillin resistant staph aureus) culture positive   . Nephrolithiasis    a. 12/2015 UVJ stone w/ hydroureteronephrosis req nephrostomy tube placement.  . Osteoarthritis    a. 02/7866 s/p L TKA complicated by infections req skin grafting.  [name] PVD (peripheral vascular disease) ([place])    a. Bilat common iliac dzs, nl ABI's --> med rx.  . Sepsis ([place])    a. 12/2015 admitted with Proteus mirabilis urosepsis/bacteremia.  . Wears glasses     Current Outpatient Medications on File Prior to Visit  Medication Sig Dispense Refill  . aspirin EC 81 MG tablet Take 81 mg by mouth daily.    [name] atorvastatin (LIPITOR) 40 MG tablet TAKE ONE TABLET BY MOUTH DAILY 90 tablet 1  . Calcium Carbonate-Vit D-Min (CALCIUM 1200 PO) Take by mouth.    . doxycycline (VIBRA-TABS) 100 MG tablet Take 100 mg by mouth 2 (two) times daily.    . furosemide (LASIX) 20 MG tablet TAKE ONE TABLET  BY MOUTH DAILY 90 tablet 5  . Magnesium Oxide (MAG-OXIDE) 200 MG TABS Take 1 tablet by mouth every morning.    . mesalamine (LIALDA) 1.2 g EC tablet Take 2.4 g by mouth daily with breakfast.    . Multiple Vitamin (MULITIVITAMIN WITH MINERALS) TABS Take 1 tablet by mouth daily.    . naproxen sodium (ALEVE) 220 MG tablet Take 220 mg by mouth as needed.    . potassium chloride SA (K-DUR,KLOR-CON) 20 MEQ tablet TAKE ONE TABLET BY MOUTH DAILY 90 tablet 3  . Probiotic Product (PROBIOTIC DAILY PO) Take 1 capsule by mouth daily.    . rifampin (RIFADIN) 300 MG capsule Take 300 mg by mouth 2 (two) times daily.     No current facility-administered medications on file prior to visit.     No Known Allergies  Blood pressure (!) 148/80, pulse 80.  Essential hypertension Patient with elevated BP still not to goal.  Will have her increase the lisinopril to 40 mg daily and repeat BMET in 2 weeks.  We will see her back in clinic in 4 weeks for follow up.  With her stable kidney function we can consider adding a low dose thiazide at her next appointment should she need further medication, as well as the potential to increase the carvedilol.   Tommy Medal PharmD CPP [facility] [address] [place] [address] 03/19/2018 9:03 AM

## 2018-03-18 NOTE — Telephone Encounter (Signed)
Patient is approved for Monovisc, right knee. Buy & Bill No Co-pay No PA required Appt.scheduled 03/25/18

## 2018-03-18 NOTE — Patient Instructions (Signed)
Return for a a follow up appointment in 1 month  Your blood pressure today is 148/80   (goal is around 130/80)  Check your blood pressure at home daily and keep record of the readings.  Take your BP meds as follows:  Increase lisinopril to 40 mg daily.  Take 2 of the 20 mg tabs until gone, then pick up the 40 mg tablet  Bring all of your meds, your BP cuff and your record of home blood pressures to your next appointment.  Exercise as you're able, try to walk approximately 30 minutes per day.  Keep salt intake to a minimum, especially watch canned and prepared boxed foods.  Eat more fresh fruits and vegetables and fewer canned items.  Avoid eating in fast food restaurants.    HOW TO TAKE YOUR BLOOD PRESSURE: . Rest 5 minutes before taking your blood pressure. .  Don't smoke or drink caffeinated beverages for at least 30 minutes before. . Take your blood pressure before (not after) you eat. . Sit comfortably with your back supported and both feet on the floor (don't cross your legs). . Elevate your arm to heart level on a table or a desk. . Use the proper sized cuff. It should fit smoothly and snugly around your bare upper arm. There should be enough room to slip a fingertip under the cuff. The bottom edge of the cuff should be 1 inch above the crease of the elbow. . Ideally, take 3 measurements at one sitting and record the average.

## 2018-03-19 ENCOUNTER — Encounter: Payer: Self-pay | Admitting: Pharmacist Clinician (PhC)/ Clinical Pharmacy Specialist

## 2018-03-19 NOTE — Assessment & Plan Note (Addendum)
Patient with elevated BP still not to goal.  Will have her increase the lisinopril to 40 mg daily and repeat BMET in 2 weeks.  We will see her back in clinic in 4 weeks for follow up.  With her stable kidney function we can consider adding a low dose thiazide at her next appointment should she need further medication, as well as the potential to increase the carvedilol.

## 2018-03-25 ENCOUNTER — Ambulatory Visit (INDEPENDENT_AMBULATORY_CARE_PROVIDER_SITE_OTHER): Payer: Medicare Other | Admitting: Orthopaedic Surgery

## 2018-03-25 ENCOUNTER — Encounter (INDEPENDENT_AMBULATORY_CARE_PROVIDER_SITE_OTHER): Payer: Self-pay | Admitting: Orthopaedic Surgery

## 2018-03-25 DIAGNOSIS — M1711 Unilateral primary osteoarthritis, right knee: Secondary | ICD-10-CM

## 2018-03-25 DIAGNOSIS — S83005A Unspecified dislocation of left patella, initial encounter: Secondary | ICD-10-CM

## 2018-03-25 MED ORDER — HYALURONAN 88 MG/4ML IX SOSY
88.0000 mg | PREFILLED_SYRINGE | INTRA_ARTICULAR | Status: AC | PRN
  Administered 2018-03-25: 88 mg via INTRA_ARTICULAR

## 2018-03-25 NOTE — Progress Notes (Signed)
Office Visit Note   Patient: [name]           Date of Birth: 05-Feb-1941           MRN: [id number] Visit Date: 03/25/2018              Requested by: Darreld Mclean, MD [address] [place], [place] [address] PCP: Darreld Mclean, MD   Assessment & Plan: Visit Diagnoses:  1. Unilateral primary osteoarthritis, right knee   2. Dislocation of left patella, initial encounter     Plan: We had a lengthy discussion regarding her chronically dislocated left patella.  I think given her surgical history she is at tremendous risk for getting an infection if we were to surgically correct the dislocated kneecap.  Together we agreed to continue to treat this nonoperatively.  For the right knee we did provide her with a Monovisc injection.  Follow-up as needed.  Follow-Up Instructions: Return if symptoms worsen or fail to improve.   Orders:  No orders of the defined types were placed in this encounter.  No orders of the defined types were placed in this encounter.     Procedures: Large Joint Inj: R knee on 03/25/2018 2:56 PM Indications: pain Details: 22 G needle  Arthrogram: No  Medications: 88 mg Hyaluronan 88 MG/4ML Outcome: tolerated well, no immediate complications Patient was prepped and draped in the usual sterile fashion.       Clinical Data: No additional findings.   Subjective: Chief Complaint  Patient presents with  . Right Knee - Injury    Monovisc injection    [name] comes in today for follow-up of her right knee osteoarthritis and chronic subluxation of her left prosthetic knee.  She continues to complain of chronic left knee pain and giving way.  Her right knee has had good relief from Monovisc injections.   Review of Systems  Constitutional: Negative.   HENT: Negative.   Eyes: Negative.   Respiratory: Negative.   Cardiovascular: Negative.   Endocrine: Negative.   Musculoskeletal: Negative.   Neurological: Negative.     Hematological: Negative.   Psychiatric/Behavioral: Negative.   All other systems reviewed and are negative.    Objective: Vital Signs: There were no vitals taken for this visit.  Physical Exam  Constitutional: She is oriented to person, place, and time. She appears well-developed and well-nourished.  Pulmonary/Chest: Effort normal.  Neurological: She is alert and oriented to person, place, and time.  Skin: Skin is warm. Capillary refill takes less than 2 seconds.  Psychiatric: She has a normal mood and affect. Her behavior is normal. Judgment and thought content normal.  Nursing note and vitals reviewed.   Ortho Exam Right knee exam is stable. Left knee exam shows fully healed surgical scars.  The chronic open wound has healed.  She has a laterally dislocated patella Specialty Comments:  No specialty comments available.  Imaging: No results found.   PMFS History: Patient Active Problem List   Diagnosis Date Noted  . Lateral dislocation of left patella 01/15/2017  . Unilateral primary osteoarthritis, right knee 01/15/2017  . Osteoporosis 11/26/2016  . Acute pain of left knee 08/27/2016  . Chronic diarrhea 08/07/2016  . Left displaced femoral neck fracture ([place]) 06/26/2016  . MRSA (methicillin resistant Staphylococcus aureus) infection 06/26/2016  . Hypertensive heart disease   . Hydronephrosis   . Obstructive uropathy   . Abnormal echocardiogram 01/23/2016  . Pressure ulcer 01/21/2016  . Chronic infection  of prosthetic knee ([place]) 10/12/2015  . AAA 01/19/2009  . ILIAC ARTERY ANEURYSM 01/19/2009  . Hyperlipidemia 01/14/2009  . TOBACCO ABUSE 01/14/2009  . Essential hypertension 01/14/2009  . Coronary atherosclerosis 01/14/2009  . Cerebrovascular disease 01/14/2009  . Peripheral vascular disease ([place]) 01/14/2009  . CHRONIC OBSTRUCTIVE PULMONARY DISEASE 01/14/2009   Past Medical History:  Diagnosis Date  . Arthritis   . CAD (coronary artery disease)    a. 02/2007  s/p PCI/DES ot LCX and RCA;  b. 07/2014 MV: no ischemia/infarct, EF 68%.  . Carotid arterial disease ([place])    a. 05/2015 Carotid U/S: RICA 6-57%, LICA 84-69%, RECA 6-29%, LECA >50% - overall stable, f/u 1 yr.  . Complication of anesthesia    Three days after procedure pt statrted to suffer with memoy loss that took about 4-5 days to come back according to [name]."  . Dilated cardiomyopathy ([place])    a. 05/2005 Echo: EF 65%; b. 01/22/2016 Echo: EF 25-30% (in setting of admission for urosepsis w/ elev trop); c. 01/26/2016 Echo: EF 35-40%, inf, distal apical, apical, and mid to distal septal HK-->felt to be 2/2 stress induced CM.  [name] Heart murmur   . History of bronchitis   . Hyperlipidemia   . Hypertension   . Hypertensive heart disease   . Memory impairment   . MRSA (methicillin resistant staph aureus) culture positive   . Nephrolithiasis    a. 12/2015 UVJ stone w/ hydroureteronephrosis req nephrostomy tube placement.  . Osteoarthritis    a. 11/2839 s/p L TKA complicated by infections req skin grafting.  [name] PVD (peripheral vascular disease) ([place])    a. Bilat common iliac dzs, nl ABI's --> med rx.  . Sepsis ([place])    a. 12/2015 admitted with Proteus mirabilis urosepsis/bacteremia.  . Wears glasses     Family History  Problem Relation Age of Onset  . Breast cancer Mother   . Leukemia Father   . Stroke Sister   . Bone cancer Unknown     Past Surgical History:  Procedure Laterality Date  . ABDOMINAL HYSTERECTOMY    . APPLICATION OF A-CELL OF EXTREMITY Left 12/08/2015   Procedure: APPLICATION OF A-CELL OF knee;  Surgeon: Loel Lofty [name], DO;  Location: [facility];  Service: Plastics;  Laterality: Left;  . APPLICATION OF WOUND VAC Left 11/16/2015   Procedure: APPLICATION OF WOUND VAC;  Surgeon: Loel Lofty [name], DO;  Location: [facility];  Service: Plastics;  Laterality: Left;  . APPLICATION OF WOUND VAC Left 12/08/2015   Procedure: APPLICATION OF WOUND VAC  AND CAST to knee;  Surgeon: Loel Lofty  [name], DO;  Location: [facility];  Service: Plastics;  Laterality: Left;  . COLONOSCOPY    . CORONARY ANGIOPLASTY  07   3 stents placed  . ESOPHAGOGASTRODUODENOSCOPY    . EYE SURGERY Bilateral    cataracts  . FREE FLAP TO EXTREMITY Left 11/16/2015   Procedure: FREE FLAP TO EXTREMITY;  Surgeon: Loel Lofty [name], DO;  Location: [facility];  Service: Plastics;  Laterality: Left;  . I&D EXTREMITY Left 10/18/2015   Procedure: Left Knee Washout, Reclosure;  Surgeon: Newt Minion, MD;  Location: [facility];  Service: Orthopedics;  Laterality: Left;  . I&D EXTREMITY Left 12/08/2015   Procedure: IRRIGATION AND DEBRIDEMENT knee;  Surgeon: Loel Lofty [name], DO;  Location: [facility];  Service: Plastics;  Laterality: Left;  . I&D KNEE WITH POLY EXCHANGE Left 10/12/2015   Procedure: Irrigation and Debridement , Poly Exchange, Antibiotic Beads Left  Knee;  Surgeon: Newt Minion, MD;  Location: [facility];  Service: Orthopedics;  Laterality: Left;  . INCISION AND DRAINAGE OF WOUND Left 01/04/2016   Procedure: DEBRIDEMENT OF LEFT KNEE WOUND WITH ACELL AND WOUND VAC PLACEMENT;  Surgeon: Loel Lofty [name], DO;  Location: [facility];  Service: Plastics;  Laterality: Left;  . IR GENERIC HISTORICAL  05/11/2016   IR FLUORO GUIDE CV MIDLINE PICC RIGHT 05/11/2016 Sandi Mariscal, MD [facility]  . IR GENERIC HISTORICAL  05/11/2016   IR US GUIDE VASC ACCESS RIGHT 05/11/2016 Sandi Mariscal, MD [facility]  . KNEE CLOSED REDUCTION Left 01/16/2017   Procedure: CLOSED MANIPULATION LEFT KNEE;  Surgeon: Leandrew Koyanagi, MD;  Location: [facility];  Service: Orthopedics;  Laterality: Left;  . KNEE SURGERY     denies  . SKIN SPLIT GRAFT Left 11/16/2015   Procedure: LEFT GASTROC MUSCLE FLAP WITH SKIN GRAFT SPLIT THICKNESS AND VAC PLACEMENT;  Surgeon: Loel Lofty [name], DO;  Location: [facility];  Service: Plastics;  Laterality: Left;  . TOTAL HIP ARTHROPLASTY Right 09/10/2012   Dr Sharol Given  . TOTAL HIP ARTHROPLASTY Right 09/10/2012   Procedure:  TOTAL HIP ARTHROPLASTY;  Surgeon: Newt Minion, MD;  Location: [facility];  Service: Orthopedics;  Laterality: Right;  Right Total Hip Arthroplasty  . TOTAL HIP ARTHROPLASTY Left 06/27/2016   Procedure: LEFT TOTAL HIP ARTHROPLASTY ANTERIOR APPROACH;  Surgeon: Leandrew Koyanagi, MD;  Location: [facility];  Service: Orthopedics;  Laterality: Left;  . TOTAL KNEE ARTHROPLASTY Left 09/28/2015   Procedure: TOTAL KNEE ARTHROPLASTY;  Surgeon: Newt Minion, MD;  Location: [facility];  Service: Orthopedics;  Laterality: Left;   Social History   Occupational History  . Occupation: Retired  Tobacco Use  . Smoking status: Former Smoker    Packs/day: 0.50    Years: 50.00    Pack years: 25.00    Types: Cigarettes    Last attempt to quit: 09/28/2015    Years since quitting: 2.4  . Smokeless tobacco: Never Used  . Tobacco comment: quit 09/27/15  Substance and Sexual Activity  . Alcohol use: No  . Drug use: No  . Sexual activity: Never

## 2018-04-02 ENCOUNTER — Ambulatory Visit (INDEPENDENT_AMBULATORY_CARE_PROVIDER_SITE_OTHER): Payer: Medicare Other | Admitting: Internal Medicine

## 2018-04-02 ENCOUNTER — Encounter: Payer: Self-pay | Admitting: Internal Medicine

## 2018-04-02 DIAGNOSIS — Z96659 Presence of unspecified artificial knee joint: Secondary | ICD-10-CM | POA: Diagnosis not present

## 2018-04-02 DIAGNOSIS — T8459XD Infection and inflammatory reaction due to other internal joint prosthesis, subsequent encounter: Secondary | ICD-10-CM | POA: Diagnosis not present

## 2018-04-02 NOTE — Progress Notes (Signed)
[facility] for Infectious Disease  Patient Active Problem List   Diagnosis Date Noted  . MRSA (methicillin resistant Staphylococcus aureus) infection 06/26/2016    Priority: High  . Chronic infection of prosthetic knee ([place]) 10/12/2015    Priority: High  . Lateral dislocation of left patella 01/15/2017  . Unilateral primary osteoarthritis, right knee 01/15/2017  . Osteoporosis 11/26/2016  . Acute pain of left knee 08/27/2016  . Chronic diarrhea 08/07/2016  . Left displaced femoral neck fracture ([place]) 06/26/2016  . Hypertensive heart disease   . Hydronephrosis   . Obstructive uropathy   . Abnormal echocardiogram 01/23/2016  . Pressure ulcer 01/21/2016  . AAA 01/19/2009  . ILIAC ARTERY ANEURYSM 01/19/2009  . Hyperlipidemia 01/14/2009  . TOBACCO ABUSE 01/14/2009  . Essential hypertension 01/14/2009  . Coronary atherosclerosis 01/14/2009  . Cerebrovascular disease 01/14/2009  . Peripheral vascular disease ([place]) 01/14/2009  . CHRONIC OBSTRUCTIVE PULMONARY DISEASE 01/14/2009    Patient's Medications  New Prescriptions   No medications on file  Previous Medications   ASPIRIN EC 81 MG TABLET    Take 81 mg by mouth daily.   ATORVASTATIN (LIPITOR) 40 MG TABLET    TAKE ONE TABLET BY MOUTH DAILY   CALCIUM CARBONATE-VIT D-MIN (CALCIUM 1200 PO)    Take by mouth.   CARVEDILOL (COREG) 12.5 MG TABLET    Take 1 tablet (12.5 mg total) by mouth 2 (two) times daily.   DOXYCYCLINE (VIBRA-TABS) 100 MG TABLET    Take 100 mg by mouth 2 (two) times daily.   FUROSEMIDE (LASIX) 20 MG TABLET    TAKE ONE TABLET BY MOUTH DAILY   LISINOPRIL (PRINIVIL,ZESTRIL) 40 MG TABLET    Take 1 tablet (40 mg total) by mouth daily.   MAGNESIUM OXIDE (MAG-OXIDE) 200 MG TABS    Take 1 tablet by mouth every morning.   MESALAMINE (LIALDA) 1.2 G EC TABLET    Take 2.4 g by mouth daily with breakfast.   MULTIPLE VITAMIN (MULITIVITAMIN WITH MINERALS) TABS    Take 1 tablet by mouth daily.   NAPROXEN SODIUM  (ALEVE) 220 MG TABLET    Take 220 mg by mouth as needed.   POTASSIUM CHLORIDE SA (K-DUR,KLOR-CON) 20 MEQ TABLET    TAKE ONE TABLET BY MOUTH DAILY   PROBIOTIC PRODUCT (PROBIOTIC DAILY PO)    Take 1 capsule by mouth daily.   RIFAMPIN (RIFADIN) 300 MG CAPSULE    Take 300 mg by mouth 2 (two) times daily.  Modified Medications   No medications on file  Discontinued Medications   No medications on file    Subjective: [name] is in for her routine follow-up visit.  She is accompanied by her daughter-in-law.  She continues to take chronic doxycycline and rifampin for her MRSA infection of her left prosthetic knee.  She has chronic diarrhea from colitis but this has not worsened.  She does not believe that she has any problems tolerating her antibiotics other than pill fatigue.  She says that she recently developed more pain in her left knee and saw her orthopedic surgeon, Eduard Roux.  There was no change in her chronically dislocated left kneecap.  They elected to continue conservative management with a brace rather than surgery.  Review of Systems: Review of Systems  Constitutional: Negative for chills, diaphoresis and fever.  Gastrointestinal: Positive for diarrhea. Negative for abdominal pain, nausea and vomiting.  Musculoskeletal: Positive for joint pain.    Past Medical History:  Diagnosis  Date  . Arthritis   . CAD (coronary artery disease)    a. 02/2007 s/p PCI/DES ot LCX and RCA;  b. 07/2014 MV: no ischemia/infarct, EF 68%.  . Carotid arterial disease ([facility])    a. 05/2015 Carotid U/S: RICA 1-69%, LICA 67-89%, RECA 3-81%, LECA >50% - overall stable, f/u 1 yr.  . Complication of anesthesia    Three days after procedure pt statrted to suffer with memoy loss that took about 4-5 days to come back according to [name]."  . Dilated cardiomyopathy ([place] [place])    a. 05/2005 Echo: EF 65%; b. 01/22/2016 Echo: EF 25-30% (in setting of admission for urosepsis w/ elev trop); c. 01/26/2016 Echo: EF  35-40%, inf, distal apical, apical, and mid to distal septal HK-->felt to be 2/2 stress induced CM.  [name] Heart murmur   . History of bronchitis   . Hyperlipidemia   . Hypertension   . Hypertensive heart disease   . Memory impairment   . MRSA (methicillin resistant staph aureus) culture positive   . Nephrolithiasis    a. 12/2015 UVJ stone w/ hydroureteronephrosis req nephrostomy tube placement.  . Osteoarthritis    a. 0/1751 s/p L TKA complicated by infections req skin grafting.  [name] PVD (peripheral vascular disease) ([place])    a. Bilat common iliac dzs, nl ABI's --> med rx.  . Sepsis ([place])    a. 12/2015 admitted with Proteus mirabilis urosepsis/bacteremia.  . Wears glasses     Social History   Tobacco Use  . Smoking status: Former Smoker    Packs/day: 0.50    Years: 50.00    Pack years: 25.00    Types: Cigarettes    Last attempt to quit: 09/28/2015    Years since quitting: 2.5  . Smokeless tobacco: Never Used  . Tobacco comment: quit 09/27/15  Substance Use Topics  . Alcohol use: No  . Drug use: No    Family History  Problem Relation Age of Onset  . Breast cancer Mother   . Leukemia Father   . Stroke Sister   . Bone cancer Unknown     No Known Allergies  Objective: Vitals:   04/02/18 0928  BP: (!) 179/75  Pulse: 66  Temp: 98 F (36.7 C)  Weight: 122 lb (55.3 kg)  Height: 5\' 2"  (1.575 m)   Body mass index is 22.31 kg/m.  Physical Exam  Constitutional: She is oriented to person, place, and time.  She is in good spirits.  Musculoskeletal:  She has a healed incision over her left knee.  Her kneecap is dislocated laterally.  This is unchanged.  There is no redness, swelling or drainage.  Neurological: She is alert and oriented to person, place, and time.  Skin: No rash noted.  Psychiatric: She has a normal mood and affect.    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  She is to take only cephalexin but developed recurrent drainage from her left knee.  This resolved promptly after adding rifampin back to her regimen.  I told her that it would be up to her to decide if she wants to stop rifampin or stop both antibiotics if she believes this would greatly improve her quality of life.  I told her that it might improve her quality  of life short-term but could lead to relapse of infection that could have a decidedly negative impact on her quality of life.  She will follow-up here in 6 months.          Michel Bickers, MD [facility] [phone] pager   [phone] cell 04/02/2018, 9:48 AM

## 2018-04-02 NOTE — Assessment & Plan Note (Signed)
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  She is to take only cephalexin but developed recurrent drainage from her left knee.  This resolved promptly after adding rifampin back to her regimen.  I told her that it would be up to her to decide if she wants to stop rifampin or stop both antibiotics if she believes this would greatly improve her quality of life.  I told her that it might improve her quality of life short-term but could lead to relapse of infection that could have a decidedly negative impact on her quality of life.  She will follow-up here in 6 months.

## 2018-04-22 ENCOUNTER — Ambulatory Visit: Payer: Medicare Other

## 2018-04-29 ENCOUNTER — Ambulatory Visit (INDEPENDENT_AMBULATORY_CARE_PROVIDER_SITE_OTHER): Payer: Medicare Other | Admitting: Pharmacist Clinician (PhC)/ Clinical Pharmacy Specialist

## 2018-04-29 ENCOUNTER — Telehealth: Payer: Self-pay | Admitting: Cardiology

## 2018-04-29 DIAGNOSIS — I10 Essential (primary) hypertension: Secondary | ICD-10-CM

## 2018-04-29 DIAGNOSIS — Z23 Encounter for immunization: Secondary | ICD-10-CM

## 2018-04-29 MED ORDER — AMLODIPINE BESYLATE 5 MG PO TABS: 5.0000 mg | ORAL_TABLET | Freq: Every day | ORAL | 5 refills | Status: DC

## 2018-04-29 NOTE — Telephone Encounter (Signed)
New Message   Pt c/o medication issue:  1. Name of Medication: amLODipine (NORVASC) 5 MG tablet  2. How are you currently taking this medication (dosage and times per day)? Take 1 tablet (5 mg total) by mouth daily.  3. Are you having a reaction (difficulty breathing--STAT)? no  4. What is your medication issue? Kelly-pharmacist at [facility] is calling about a drug interaction between Amlodipine by [facility] and rifampin - prescribed by another physician. Please call

## 2018-04-29 NOTE — Progress Notes (Signed)
Patient ID: [name]                 DOB: [birth year]                      MRN: [id number]     HPI: [name] is a 77 y.o. female patient of Dr [name] referred by Arnold Long NP to HTN clinic.  PMH includes CAD s/p PCI , hypertension, hyperlipidemia, and dilated cardiomyopathy with EF of 55%.  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  Because of the chronic pain in her knee she uses naproxen 220 mg bid.  Without this dose she notes an increase in pain/discomfort and is not willing to discontinue use.    Over the past few months her carvedilol has been increased to 12.5 mg and the lisinopril to 40 mg.  Despite these increases she still has not been able to get her blood pressure down below 497 systolic except for a few occasions.  Today she is here with her son, reports no problems with her medications and no concerns other than the continued high readings.   Current HTN meds:  Carvedilol 12.5 mg twice daily Furosemide 20mg  daily Lisinopril 40mg  daily   Intolerance:none   BP goal: 130/80  Family History: The patient's family history includes Bone cancer in her unknown relative; Breast cancer in her mother; Leukemia in her father; Stroke in her sister.  Social History: The patient  reports that she quit smoking about 2 years ago. Her smoking use included cigarettes. She has a 25.00 pack-year smoking history. She has never used smokeless tobacco. She reports that she does not drink alcohol or use drugs.  Coffee and sweet tea 3-5 cups of coffee per day; 2-3 glasses sweet tea per day, occasional cola; also drinks plenty of water (2 full 20oz cups)  Diet:  Does add salt to meats, eggs; eats mostly home cooked meals  Exercise: minimal due to problems with knee joint  Home BP readings: 22 readings from past month show average essentially unchanged at 168/91 (167/93 last month)   Wt Readings from Last 3 Encounters:  04/02/18  122 lb (55.3 kg)  03/03/18 122 lb 9.6 oz (55.6 kg)  01/22/18 121 lb 12.8 oz (55.2 kg)   BP Readings from Last 3 Encounters:  04/29/18 (!) 158/90  04/02/18 (!) 179/75  03/19/18 (!) 148/80   Pulse Readings from Last 3 Encounters:  04/29/18 76  04/02/18 66  03/19/18 80    Past Medical History:  Diagnosis Date  . Arthritis   . CAD (coronary artery disease)    a. 02/2007 s/p PCI/DES ot LCX and RCA;  b. 07/2014 MV: no ischemia/infarct, EF 68%.  . Carotid arterial disease ([place])    a. 05/2015 Carotid U/S: RICA 0-26%, LICA 37-85%, RECA 8-85%, LECA >50% - overall stable, f/u 1 yr.  . Complication of anesthesia    Three days after procedure pt statrted to suffer with memoy loss that took about 4-5 days to come back according to pt son [name]."  . Dilated cardiomyopathy ([place])    a. 05/2005 Echo: EF 65%; b. 01/22/2016 Echo: EF 25-30% (in setting of admission for urosepsis w/ elev trop); c. 01/26/2016 Echo: EF 35-40%, inf, distal apical, apical, and mid to distal septal HK-->felt to be 2/2 stress induced CM.  [name] Heart murmur   . History of bronchitis   . Hyperlipidemia   .  Hypertension   . Hypertensive heart disease   . Memory impairment   . MRSA (methicillin resistant staph aureus) culture positive   . Nephrolithiasis    a. 12/2015 UVJ stone w/ hydroureteronephrosis req nephrostomy tube placement.  . Osteoarthritis    a. 11/537 s/p L TKA complicated by infections req skin grafting.  [name] PVD (peripheral vascular disease) ([place])    a. Bilat common iliac dzs, nl ABI's --> med rx.  . Sepsis ([facility])    a. 12/2015 admitted with Proteus mirabilis urosepsis/bacteremia.  . Wears glasses     Current Outpatient Medications on File Prior to Visit  Medication Sig Dispense Refill  . aspirin EC 81 MG tablet Take 81 mg by mouth daily.    [name] atorvastatin (LIPITOR) 40 MG tablet TAKE ONE TABLET BY MOUTH DAILY 90 tablet 1  . Calcium Carbonate-Vit D-Min (CALCIUM 1200 PO) Take by mouth.    . carvedilol (COREG) 12.5  MG tablet Take 1 tablet (12.5 mg total) by mouth 2 (two) times daily. (Patient taking differently: Take 6.25 mg by mouth 2 (two) times daily. ) 60 tablet 5  . doxycycline (VIBRA-TABS) 100 MG tablet Take 100 mg by mouth 2 (two) times daily.    . furosemide (LASIX) 20 MG tablet TAKE ONE TABLET BY MOUTH DAILY 90 tablet 5  . lisinopril (PRINIVIL,ZESTRIL) 40 MG tablet Take 1 tablet (40 mg total) by mouth daily. 30 tablet 3  . Magnesium Oxide (MAG-OXIDE) 200 MG TABS Take 1 tablet by mouth every morning.    . mesalamine (LIALDA) 1.2 g EC tablet Take 2.4 g by mouth daily with breakfast.    . Multiple Vitamin (MULITIVITAMIN WITH MINERALS) TABS Take 1 tablet by mouth daily.    . naproxen sodium (ALEVE) 220 MG tablet Take 220 mg by mouth as needed.    . potassium chloride SA (K-DUR,KLOR-CON) 20 MEQ tablet TAKE ONE TABLET BY MOUTH DAILY 90 tablet 3  . Probiotic Product (PROBIOTIC DAILY PO) Take 1 capsule by mouth daily.    . rifampin (RIFADIN) 300 MG capsule Take 300 mg by mouth 2 (two) times daily.     No current facility-administered medications on file prior to visit.     No Known Allergies  Blood pressure (!) 158/90, pulse 76.  Essential hypertension Patient with essential hypertension, still not well controlled.  Will add amlodipine 5 mg once daily in the evenings.  She will continue with daily home blood pressure checks.  She is due to see Dr. [name] in about 6 weeks, so we will follow up with her after that as needed.     Tommy Medal PharmD CPP [facility] [address] [place] [address] 04/30/2018 7:22 AM

## 2018-04-29 NOTE — Patient Instructions (Signed)
Call in 3-4 weeks (before the end of October) and let me know what your home blood pressure readings are or if you are having any side effects.  Glendy Barsanti/Raquel at [phone]  Your blood pressure today is 158/92  Check your blood pressure at home daily and keep record of the readings.  Take your BP meds as follows:  Start amlodipine 5 mg once daily in the evenings  Continue all other medications  Bring all of your meds, your BP cuff and your record of home blood pressures to your next appointment.  Exercise as you're able, try to walk approximately 30 minutes per day.  Keep salt intake to a minimum, especially watch canned and prepared boxed foods.  Eat more fresh fruits and vegetables and fewer canned items.  Avoid eating in fast food restaurants.    HOW TO TAKE YOUR BLOOD PRESSURE: . Rest 5 minutes before taking your blood pressure. .  Don't smoke or drink caffeinated beverages for at least 30 minutes before. . Take your blood pressure before (not after) you eat. . Sit comfortably with your back supported and both feet on the floor (don't cross your legs). . Elevate your arm to heart level on a table or a desk. . Use the proper sized cuff. It should fit smoothly and snugly around your bare upper arm. There should be enough room to slip a fingertip under the cuff. The bottom edge of the cuff should be 1 inch above the crease of the elbow. . Ideally, take 3 measurements at one sitting and record the average.

## 2018-04-30 ENCOUNTER — Encounter: Payer: Self-pay | Admitting: Pharmacist Clinician (PhC)/ Clinical Pharmacy Specialist

## 2018-04-30 NOTE — Telephone Encounter (Signed)
Aware of potential interaction (may decrease amlodipine response).    Okay to dispense Rx as written. We will follow up with patient to monitor response.

## 2018-04-30 NOTE — Telephone Encounter (Signed)
Spoke with Pharmacist at [facility] and informed that per Pharm D they are aware of interaction, and ok to dispense RX as written as they will be monitoring pt.

## 2018-04-30 NOTE — Assessment & Plan Note (Signed)
Patient with essential hypertension, still not well controlled.  Will add amlodipine 5 mg once daily in the evenings.  She will continue with daily home blood pressure checks.  She is due to see Dr. [name] in about 6 weeks, so we will follow up with her after that as needed.

## 2018-05-27 NOTE — Progress Notes (Signed)
HPI: FU coronary artery disease. She is status post PCI of her circumflex and right coronary artery with drug-eluting stents in August 2008. Abdominal ultrasound in January 2014 showed moderate right and severe left common iliac stenosis. ABIs in January of 2014 were normal. Patient seen by Dr Fletcher Anon and medical therapy recommended for now. Nuclear study 1/16 showed EF 68 and normal perfusion. Pt had probable stress induced CM 6/17 in setting of urosepsis. Also with GI bleed 7/17 related to gastic AVM; ablated. Carotid Dopplers October 2018 showed 1 to 39% bilateral stenosis.  Last echocardiogram July 2019 showed normal LV systolic function, grade 1 diastolic dysfunction, mild aortic stenosis with mean gradient 10 mmHg, mild aortic insufficiency, mild right ventricular enlargement.  There was note of mobile mass in the right atrium possibly secondary to thrombus or more likely prominent eustachian valve; CT or TEE recommended.  Since I last saw her,  there is no dyspnea, chest pain, palpitations or syncope.  Current Outpatient Medications  Medication Sig Dispense Refill  . amLODipine (NORVASC) 5 MG tablet Take 1 tablet (5 mg total) by mouth daily. 30 tablet 5  . aspirin EC 81 MG tablet Take 81 mg by mouth daily.    [name] atorvastatin (LIPITOR) 40 MG tablet TAKE ONE TABLET BY MOUTH DAILY 90 tablet 1  . Calcium Carbonate-Vit D-Min (CALCIUM 1200 PO) Take by mouth.    . carvedilol (COREG) 12.5 MG tablet Take 1 tablet (12.5 mg total) by mouth 2 (two) times daily. (Patient taking differently: Take 6.25 mg by mouth 2 (two) times daily. ) 60 tablet 5  . doxycycline (VIBRA-TABS) 100 MG tablet Take 100 mg by mouth 2 (two) times daily.    . furosemide (LASIX) 20 MG tablet TAKE ONE TABLET BY MOUTH DAILY 90 tablet 5  . lisinopril (PRINIVIL,ZESTRIL) 40 MG tablet Take 1 tablet (40 mg total) by mouth daily. 30 tablet 3  . Magnesium Oxide (MAG-OXIDE) 200 MG TABS Take 1 tablet by mouth every morning.    . Multiple  Vitamin (MULITIVITAMIN WITH MINERALS) TABS Take 1 tablet by mouth daily.    . naproxen sodium (ALEVE) 220 MG tablet Take 220 mg by mouth as needed.    . potassium chloride SA (K-DUR,KLOR-CON) 20 MEQ tablet TAKE ONE TABLET BY MOUTH DAILY 90 tablet 3  . Probiotic Product (PROBIOTIC DAILY PO) Take 1 capsule by mouth daily.    . rifampin (RIFADIN) 300 MG capsule Take 300 mg by mouth 2 (two) times daily.    . mesalamine (LIALDA) 1.2 g EC tablet Take 2.4 g by mouth daily with breakfast.     No current facility-administered medications for this visit.      Past Medical History:  Diagnosis Date  . Arthritis   . CAD (coronary artery disease)    a. 02/2007 s/p PCI/DES ot LCX and RCA;  b. 07/2014 MV: no ischemia/infarct, EF 68%.  . Carotid arterial disease ([place])    a. 05/2015 Carotid U/S: RICA 6-65%, LICA 99-35%, RECA 7-01%, LECA >50% - overall stable, f/u 1 yr.  . Complication of anesthesia    Three days after procedure pt statrted to suffer with memoy loss that took about 4-5 days to come back according to [name]."  . Dilated cardiomyopathy ([place])    a. 05/2005 Echo: EF 65%; b. 01/22/2016 Echo: EF 25-30% (in setting of admission for urosepsis w/ elev trop); c. 01/26/2016 Echo: EF 35-40%, inf, distal apical, apical, and mid to distal septal HK-->felt to  be 2/2 stress induced CM.  [name] Heart murmur   . History of bronchitis   . Hyperlipidemia   . Hypertension   . Hypertensive heart disease   . Memory impairment   . MRSA (methicillin resistant staph aureus) culture positive   . Nephrolithiasis    a. 12/2015 UVJ stone w/ hydroureteronephrosis req nephrostomy tube placement.  . Osteoarthritis    a. 07/256 s/p L TKA complicated by infections req skin grafting.  [name] PVD (peripheral vascular disease) ([place])    a. Bilat common iliac dzs, nl ABI's --> med rx.  . Sepsis ([place])    a. 12/2015 admitted with Proteus mirabilis urosepsis/bacteremia.  . Wears glasses     Past Surgical History:  Procedure  Laterality Date  . ABDOMINAL HYSTERECTOMY    . APPLICATION OF A-CELL OF EXTREMITY Left 12/08/2015   Procedure: APPLICATION OF A-CELL OF knee;  Surgeon: Loel Lofty [name], DO;  Location: [facility];  Service: Plastics;  Laterality: Left;  . APPLICATION OF WOUND VAC Left 11/16/2015   Procedure: APPLICATION OF WOUND VAC;  Surgeon: Loel Lofty [name], DO;  Location: [facility];  Service: Plastics;  Laterality: Left;  . APPLICATION OF WOUND VAC Left 12/08/2015   Procedure: APPLICATION OF WOUND VAC  AND CAST to knee;  Surgeon: Loel Lofty [name], DO;  Location: [facility];  Service: Plastics;  Laterality: Left;  . COLONOSCOPY    . CORONARY ANGIOPLASTY  07   3 stents placed  . ESOPHAGOGASTRODUODENOSCOPY    . EYE SURGERY Bilateral    cataracts  . FREE FLAP TO EXTREMITY Left 11/16/2015   Procedure: FREE FLAP TO EXTREMITY;  Surgeon: Loel Lofty [name], DO;  Location: [facility];  Service: Plastics;  Laterality: Left;  . I&D EXTREMITY Left 10/18/2015   Procedure: Left Knee Washout, Reclosure;  Surgeon: Newt Minion, MD;  Location: [facility];  Service: Orthopedics;  Laterality: Left;  . I&D EXTREMITY Left 12/08/2015   Procedure: IRRIGATION AND DEBRIDEMENT knee;  Surgeon: Loel Lofty [name], DO;  Location: [place] [facility];  Service: Plastics;  Laterality: Left;  . I&D KNEE WITH POLY EXCHANGE Left 10/12/2015   Procedure: Irrigation and Debridement , Poly Exchange, Antibiotic Beads Left Knee;  Surgeon: Newt Minion, MD;  Location: [facility];  Service: Orthopedics;  Laterality: Left;  . INCISION AND DRAINAGE OF WOUND Left 01/04/2016   Procedure: DEBRIDEMENT OF LEFT KNEE WOUND WITH ACELL AND WOUND VAC PLACEMENT;  Surgeon: Loel Lofty [name], DO;  Location: [facility];  Service: Plastics;  Laterality: Left;  . IR GENERIC HISTORICAL  05/11/2016   IR FLUORO GUIDE CV MIDLINE PICC RIGHT 05/11/2016 Sandi Mariscal, MD [facility]  . IR GENERIC HISTORICAL  05/11/2016   IR US GUIDE VASC ACCESS RIGHT 05/11/2016 Sandi Mariscal, MD [facility]  . KNEE CLOSED  REDUCTION Left 01/16/2017   Procedure: CLOSED MANIPULATION LEFT KNEE;  Surgeon: Leandrew Koyanagi, MD;  Location: [facility];  Service: Orthopedics;  Laterality: Left;  . KNEE SURGERY     denies  . SKIN SPLIT GRAFT Left 11/16/2015   Procedure: LEFT GASTROC MUSCLE FLAP WITH SKIN GRAFT SPLIT THICKNESS AND VAC PLACEMENT;  Surgeon: Loel Lofty [name], DO;  Location: [facility];  Service: Plastics;  Laterality: Left;  . TOTAL HIP ARTHROPLASTY Right 09/10/2012   Dr Sharol Given  . TOTAL HIP ARTHROPLASTY Right 09/10/2012   Procedure: TOTAL HIP ARTHROPLASTY;  Surgeon: Newt Minion, MD;  Location: [facility];  Service: Orthopedics;  Laterality: Right;  Right Total Hip Arthroplasty  .  TOTAL HIP ARTHROPLASTY Left 06/27/2016   Procedure: LEFT TOTAL HIP ARTHROPLASTY ANTERIOR APPROACH;  Surgeon: Leandrew Koyanagi, MD;  Location: [facility];  Service: Orthopedics;  Laterality: Left;  . TOTAL KNEE ARTHROPLASTY Left 09/28/2015   Procedure: TOTAL KNEE ARTHROPLASTY;  Surgeon: Newt Minion, MD;  Location: [facility];  Service: Orthopedics;  Laterality: Left;    Social History   Socioeconomic History  . Marital status: Single    Spouse name: Not on file  . Number of children: Not on file  . Years of education: Not on file  . Highest education level: Not on file  Occupational History  . Occupation: Retired  Scientific laboratory technician  . Financial resource strain: Not on file  . Food insecurity:    Worry: Not on file    Inability: Not on file  . Transportation needs:    Medical: Not on file    Non-medical: Not on file  Tobacco Use  . Smoking status: Former Smoker    Packs/day: 0.50    Years: 50.00    Pack years: 25.00    Types: Cigarettes    Last attempt to quit: 09/28/2015    Years since quitting: 2.6  . Smokeless tobacco: Never Used  . Tobacco comment: quit 09/27/15  Substance and Sexual Activity  . Alcohol use: No  . Drug use: No  . Sexual activity: Never  Lifestyle  . Physical activity:    Days per week: Not on file     Minutes per session: Not on file  . Stress: Not on file  Relationships  . Social connections:    Talks on phone: Not on file    Gets together: Not on file    Attends religious service: Not on file    Active member of club or organization: Not on file    Attends meetings of clubs or organizations: Not on file    Relationship status: Not on file  . Intimate partner violence:    Fear of current or ex partner: Not on file    Emotionally abused: Not on file    Physically abused: Not on file    Forced sexual activity: Not on file  Other Topics Concern  . Not on file  Social History Narrative   She has been living with her daughter when not in rehab since the surgery in March.    Family History  Problem Relation Age of Onset  . Breast cancer Mother   . Leukemia Father   . Stroke Sister   . Bone cancer Unknown     ROS: Knee arthralgias but no fevers or chills, productive cough, hemoptysis, dysphasia, odynophagia, melena, hematochezia, dysuria, hematuria, rash, seizure activity, orthopnea, PND, pedal edema, claudication. Remaining systems are negative.  Physical Exam: Well-developed well-nourished in no acute distress.  Skin is warm and dry.  HEENT is normal.  Neck is supple.  Chest is clear to auscultation with normal expansion.  Cardiovascular exam is regular rate and rhythm.  Abdominal exam nontender or distended. No masses palpated. Extremities show no edema. neuro grossly intact  A/P  1 coronary artery disease-patient denies chest pain.  Continue medical therapy with aspirin and statin.  2 hypertension-patient's blood pressure is controlled.  Continue present medications and follow.  Check potassium and renal function.  3 hyperlipidemia-continue statin.  Check lipids and liver.  4 tobacco abuse-patient has discontinued.  5 history of question RA mass-we will repeat echocardiogram.  6 carotid artery disease-mild on most recent carotid Dopplers.  Will repeat  study  October 2020.  7 history of stress-induced cardiomyopathy-LV function improved on most recent echocardiogram.  8 mild aortic stenosis/aortic insufficiency-she will need follow-up echoes in the future.  9 peripheral vascular disease-continue aspirin and statin.  Kirk Ruths, MD

## 2018-05-28 ENCOUNTER — Ambulatory Visit (HOSPITAL_COMMUNITY)
Admission: RE | Admit: 2018-05-28 | Payer: Medicare Other | Source: Ambulatory Visit | Attending: Physician Assistant | Admitting: Physician Assistant

## 2018-05-30 ENCOUNTER — Ambulatory Visit (HOSPITAL_COMMUNITY): Payer: Medicare Other

## 2018-06-03 ENCOUNTER — Ambulatory Visit (HOSPITAL_COMMUNITY)
Admission: RE | Admit: 2018-06-03 | Discharge: 2018-06-03 | Disposition: A | Discharge status: Home / Self Care | Payer: Medicare Other | Source: Ambulatory Visit | Attending: Internal Medicine | Admitting: Internal Medicine

## 2018-06-03 DIAGNOSIS — I6523 Occlusion and stenosis of bilateral carotid arteries: Secondary | ICD-10-CM | POA: Insufficient documentation

## 2018-06-09 ENCOUNTER — Ambulatory Visit (INDEPENDENT_AMBULATORY_CARE_PROVIDER_SITE_OTHER): Payer: Medicare Other | Admitting: Cardiology

## 2018-06-09 ENCOUNTER — Encounter: Payer: Self-pay | Admitting: Cardiology

## 2018-06-09 VITALS — BP 128/86 | HR 76 | Ht 62.0 in | Wt 123.0 lb

## 2018-06-09 DIAGNOSIS — I6523 Occlusion and stenosis of bilateral carotid arteries: Secondary | ICD-10-CM

## 2018-06-09 DIAGNOSIS — I5189 Other ill-defined heart diseases: Secondary | ICD-10-CM

## 2018-06-09 DIAGNOSIS — I2510 Atherosclerotic heart disease of native coronary artery without angina pectoris: Secondary | ICD-10-CM | POA: Diagnosis not present

## 2018-06-09 DIAGNOSIS — I10 Essential (primary) hypertension: Secondary | ICD-10-CM

## 2018-06-09 DIAGNOSIS — E7800 Pure hypercholesterolemia, unspecified: Secondary | ICD-10-CM | POA: Diagnosis not present

## 2018-06-09 LAB — LIPID PANEL
CHOL/HDL RATIO: 2.4 ratio (ref 0.0–4.4)
Cholesterol, Total: 153 mg/dL (ref 100–199)
HDL: 63 mg/dL (ref >39)
LDL Calculated: 67 mg/dL (ref 0–99)
Triglycerides: 116 mg/dL (ref 0–149)
VLDL CHOLESTEROL CAL: 23 mg/dL (ref 5–40)

## 2018-06-09 LAB — HEPATIC FUNCTION PANEL
ALT: 15 IU/L (ref 0–32)
AST: 25 IU/L (ref 0–40)
Albumin: 3.8 g/dL (ref 3.5–4.8)
Alkaline Phosphatase: 99 IU/L (ref 39–117)
Bilirubin Total: 0.3 mg/dL (ref 0.0–1.2)
Bilirubin, Direct: 0.11 mg/dL (ref 0.00–0.40)
TOTAL PROTEIN: 6.5 g/dL (ref 6.0–8.5)

## 2018-06-09 LAB — BASIC METABOLIC PANEL
BUN/Creatinine Ratio: 21 (ref 12–28)
BUN: 21 mg/dL (ref 8–27)
CO2: 25 mmol/L (ref 20–29)
Calcium: 10.3 mg/dL (ref 8.7–10.3)
Chloride: 100 mmol/L (ref 96–106)
Creatinine, Ser: 1.01 mg/dL — ABNORMAL HIGH (ref 0.57–1.00)
GFR, EST AFRICAN AMERICAN: 62 mL/min/{1.73_m2} (ref >59)
GFR, EST NON AFRICAN AMERICAN: 54 mL/min/{1.73_m2} — AB (ref >59)
Glucose: 98 mg/dL (ref 65–99)
Potassium: 4.5 mmol/L (ref 3.5–5.2)
Sodium: 138 mmol/L (ref 134–144)

## 2018-06-09 NOTE — Patient Instructions (Signed)
Medication Instructions:  Your physician recommends that you continue on your current medications as directed. Please refer to the Current Medication list given to you today.  If you need a refill on your cardiac medications before your next appointment, please call your pharmacy.   Lab work: Art gallery manager, lipid, lft If you have labs (blood work) drawn today and your tests are completely normal, you will receive your results only by: [name] MyChart Message (if you have MyChart) OR . A paper copy in the mail If you have any lab test that is abnormal or we need to change your treatment, we will call you to review the results.  Testing/Procedures: Your physician has requested that you have an echocardiogram. Echocardiography is a painless test that uses sound waves to create images of your heart. It provides your doctor with information about the size and shape of your heart and how well your heart's chambers and valves are working. This procedure takes approximately one hour. There are no restrictions for this procedure.   Follow-Up: At [facility], you and your health needs are our priority.  As part of our continuing mission to provide you with exceptional heart care, we have created designated Provider Care Teams.  These Care Teams include your primary Cardiologist (physician) and Advanced Practice Providers (APPs -  Physician Assistants and Nurse Practitioners) who all work together to provide you with the care you need, when you need it. You will need a follow up appointment in 6 months.  Please call our office 2 months in advance to schedule this appointment.  You may see Dr.Crenshaw or one of the following Advanced Practice Providers on your designated Care Team:   Kerin Ransom, PA-C Roby Lofts, [place] . Sande Rives, PA-C  Any Other Special Instructions Will Be Listed Below (If Applicable).

## 2018-06-13 ENCOUNTER — Other Ambulatory Visit: Payer: Self-pay

## 2018-06-13 MED ORDER — LISINOPRIL 40 MG PO TABS: 40.0000 mg | ORAL_TABLET | Freq: Every day | ORAL | 3 refills | Status: DC

## 2018-06-16 ENCOUNTER — Ambulatory Visit (HOSPITAL_COMMUNITY): Payer: Medicare Other | Attending: Cardiology

## 2018-06-16 ENCOUNTER — Other Ambulatory Visit: Payer: Self-pay

## 2018-06-16 DIAGNOSIS — I5189 Other ill-defined heart diseases: Secondary | ICD-10-CM | POA: Diagnosis not present

## 2018-06-25 ENCOUNTER — Other Ambulatory Visit: Payer: Self-pay | Admitting: Internal Medicine

## 2018-06-25 DIAGNOSIS — T8459XD Infection and inflammatory reaction due to other internal joint prosthesis, subsequent encounter: Secondary | ICD-10-CM

## 2018-06-25 DIAGNOSIS — Z96659 Presence of unspecified artificial knee joint: Principal | ICD-10-CM

## 2018-07-22 ENCOUNTER — Encounter (HOSPITAL_COMMUNITY): Payer: Self-pay | Admitting: Internal Medicine

## 2018-07-22 ENCOUNTER — Observation Stay (HOSPITAL_COMMUNITY)
Admission: EM | Admit: 2018-07-22 | Discharge: 2018-07-25 | Disposition: A | Discharge status: Skilled Nursing Facility | Payer: Medicare Other | Attending: Internal Medicine | Admitting: Internal Medicine

## 2018-07-22 ENCOUNTER — Emergency Department (HOSPITAL_COMMUNITY): Payer: Medicare Other

## 2018-07-22 DIAGNOSIS — I2510 Atherosclerotic heart disease of native coronary artery without angina pectoris: Secondary | ICD-10-CM | POA: Diagnosis not present

## 2018-07-22 DIAGNOSIS — Z955 Presence of coronary angioplasty implant and graft: Secondary | ICD-10-CM | POA: Insufficient documentation

## 2018-07-22 DIAGNOSIS — S32511A Fracture of superior rim of right pubis, initial encounter for closed fracture: Principal | ICD-10-CM

## 2018-07-22 DIAGNOSIS — R011 Cardiac murmur, unspecified: Secondary | ICD-10-CM | POA: Insufficient documentation

## 2018-07-22 DIAGNOSIS — W19XXXA Unspecified fall, initial encounter: Secondary | ICD-10-CM | POA: Diagnosis not present

## 2018-07-22 DIAGNOSIS — I10 Essential (primary) hypertension: Secondary | ICD-10-CM

## 2018-07-22 DIAGNOSIS — E785 Hyperlipidemia, unspecified: Secondary | ICD-10-CM | POA: Diagnosis not present

## 2018-07-22 DIAGNOSIS — S32599A Other specified fracture of unspecified pubis, initial encounter for closed fracture: Secondary | ICD-10-CM | POA: Diagnosis present

## 2018-07-22 DIAGNOSIS — S32501A Unspecified fracture of right pubis, initial encounter for closed fracture: Secondary | ICD-10-CM | POA: Diagnosis not present

## 2018-07-22 DIAGNOSIS — R52 Pain, unspecified: Secondary | ICD-10-CM | POA: Diagnosis not present

## 2018-07-22 DIAGNOSIS — Z792 Long term (current) use of antibiotics: Secondary | ICD-10-CM | POA: Diagnosis not present

## 2018-07-22 DIAGNOSIS — J449 Chronic obstructive pulmonary disease, unspecified: Secondary | ICD-10-CM | POA: Insufficient documentation

## 2018-07-22 DIAGNOSIS — Z79899 Other long term (current) drug therapy: Secondary | ICD-10-CM | POA: Diagnosis not present

## 2018-07-22 DIAGNOSIS — Z7982 Long term (current) use of aspirin: Secondary | ICD-10-CM | POA: Insufficient documentation

## 2018-07-22 DIAGNOSIS — I119 Hypertensive heart disease without heart failure: Secondary | ICD-10-CM | POA: Diagnosis not present

## 2018-07-22 DIAGNOSIS — A4902 Methicillin resistant Staphylococcus aureus infection, unspecified site: Secondary | ICD-10-CM | POA: Insufficient documentation

## 2018-07-22 DIAGNOSIS — I739 Peripheral vascular disease, unspecified: Secondary | ICD-10-CM | POA: Diagnosis not present

## 2018-07-22 DIAGNOSIS — M25551 Pain in right hip: Secondary | ICD-10-CM | POA: Diagnosis not present

## 2018-07-22 DIAGNOSIS — M25572 Pain in left ankle and joints of left foot: Secondary | ICD-10-CM | POA: Diagnosis not present

## 2018-07-22 DIAGNOSIS — Z87891 Personal history of nicotine dependence: Secondary | ICD-10-CM | POA: Insufficient documentation

## 2018-07-22 LAB — CBC
HCT: 38.6 % (ref 36.0–46.0)
HEMOGLOBIN: 13.8 g/dL (ref 12.0–15.0)
MCH: 35.8 pg — AB (ref 26.0–34.0)
MCHC: 35.8 g/dL (ref 30.0–36.0)
MCV: 100 fL (ref 80.0–100.0)
Platelets: 173 10*3/uL (ref 150–400)
RBC: 3.86 MIL/uL — ABNORMAL LOW (ref 3.87–5.11)
RDW: 13.3 % (ref 11.5–15.5)
WBC: 6.8 10*3/uL (ref 4.0–10.5)
nRBC: 0 % (ref 0.0–0.2)

## 2018-07-22 LAB — BASIC METABOLIC PANEL
Anion gap: 10 (ref 5–15)
BUN: 16 mg/dL (ref 8–23)
CHLORIDE: 105 mmol/L (ref 98–111)
CO2: 24 mmol/L (ref 22–32)
CREATININE: 0.91 mg/dL (ref 0.44–1.00)
Calcium: 9.2 mg/dL (ref 8.9–10.3)
GFR calc Af Amer: >60 mL/min (ref >60)
GFR calc non Af Amer: >60 mL/min (ref >60)
Glucose, Bld: 119 mg/dL — ABNORMAL HIGH (ref 70–99)
Potassium: 4.3 mmol/L (ref 3.5–5.1)
Sodium: 139 mmol/L (ref 135–145)

## 2018-07-22 LAB — URINALYSIS, ROUTINE W REFLEX MICROSCOPIC
BILIRUBIN URINE: NEGATIVE
Glucose, UA: NEGATIVE mg/dL
HGB URINE DIPSTICK: NEGATIVE
Ketones, ur: NEGATIVE mg/dL
Leukocytes, UA: NEGATIVE
NITRITE: NEGATIVE
PH: 6 (ref 5.0–8.0)
Protein, ur: NEGATIVE mg/dL
SPECIFIC GRAVITY, URINE: 1.002 — AB (ref 1.005–1.030)

## 2018-07-22 MED ORDER — RIFAMPIN 300 MG PO CAPS
300.0000 mg | ORAL_CAPSULE | Freq: Two times a day (BID) | ORAL | Status: DC
  Administered 2018-07-22 – 2018-07-25 (×6): 300 mg via ORAL
  Filled 2018-07-22 (×6): qty 1

## 2018-07-22 MED ORDER — MESALAMINE 1.2 G PO TBEC
2.4000 g | DELAYED_RELEASE_TABLET | Freq: Every day | ORAL | Status: DC
  Administered 2018-07-23 – 2018-07-25 (×3): 2.4 g via ORAL
  Filled 2018-07-22 (×3): qty 2

## 2018-07-22 MED ORDER — AMLODIPINE BESYLATE 5 MG PO TABS
5.0000 mg | ORAL_TABLET | Freq: Every day | ORAL | Status: DC
  Administered 2018-07-23 – 2018-07-25 (×3): 5 mg via ORAL
  Filled 2018-07-22 (×3): qty 1

## 2018-07-22 MED ORDER — ENOXAPARIN SODIUM 40 MG/0.4ML ~~LOC~~ SOLN
40.0000 mg | SUBCUTANEOUS | Status: DC
  Administered 2018-07-22 – 2018-07-24 (×3): 40 mg via SUBCUTANEOUS
  Filled 2018-07-22 (×3): qty 0.4

## 2018-07-22 MED ORDER — CARVEDILOL 12.5 MG PO TABS
12.5000 mg | ORAL_TABLET | Freq: Two times a day (BID) | ORAL | Status: DC
  Administered 2018-07-22 – 2018-07-25 (×6): 12.5 mg via ORAL
  Filled 2018-07-22 (×6): qty 1

## 2018-07-22 MED ORDER — FUROSEMIDE 20 MG PO TABS
20.0000 mg | ORAL_TABLET | Freq: Every day | ORAL | Status: DC
  Administered 2018-07-23 – 2018-07-25 (×3): 20 mg via ORAL
  Filled 2018-07-22 (×3): qty 1

## 2018-07-22 MED ORDER — DOXYCYCLINE HYCLATE 100 MG PO TABS
100.0000 mg | ORAL_TABLET | Freq: Two times a day (BID) | ORAL | Status: DC
  Administered 2018-07-22 – 2018-07-25 (×6): 100 mg via ORAL
  Filled 2018-07-22 (×6): qty 1

## 2018-07-22 MED ORDER — LISINOPRIL 20 MG PO TABS
40.0000 mg | ORAL_TABLET | Freq: Every day | ORAL | Status: DC
  Administered 2018-07-23 – 2018-07-25 (×3): 40 mg via ORAL
  Filled 2018-07-22 (×3): qty 2

## 2018-07-22 MED ORDER — FENTANYL CITRATE (PF) 100 MCG/2ML IJ SOLN
50.0000 ug | Freq: Once | INTRAMUSCULAR | Status: AC
  Administered 2018-07-22: 50 ug via INTRAVENOUS
  Filled 2018-07-22: qty 2

## 2018-07-22 MED ORDER — ASPIRIN EC 81 MG PO TBEC
81.0000 mg | DELAYED_RELEASE_TABLET | Freq: Every day | ORAL | Status: DC
  Administered 2018-07-23 – 2018-07-25 (×3): 81 mg via ORAL
  Filled 2018-07-22 (×3): qty 1

## 2018-07-22 MED ORDER — MORPHINE SULFATE (PF) 2 MG/ML IV SOLN: 2.0000 mg | INTRAVENOUS | Status: DC | PRN

## 2018-07-22 MED ORDER — OXYCODONE HCL 5 MG PO TABS
5.0000 mg | ORAL_TABLET | ORAL | Status: DC | PRN
  Administered 2018-07-22 – 2018-07-25 (×9): 5 mg via ORAL
  Filled 2018-07-22 (×9): qty 1

## 2018-07-22 MED ORDER — ATORVASTATIN CALCIUM 40 MG PO TABS
40.0000 mg | ORAL_TABLET | Freq: Every day | ORAL | Status: DC
  Administered 2018-07-23 – 2018-07-24 (×2): 40 mg via ORAL
  Filled 2018-07-22 (×2): qty 1

## 2018-07-22 MED ORDER — IPRATROPIUM-ALBUTEROL 0.5-2.5 (3) MG/3ML IN SOLN: 3.0000 mL | Freq: Four times a day (QID) | RESPIRATORY_TRACT | Status: DC | PRN

## 2018-07-22 NOTE — ED Notes (Signed)
Patient transported to X-ray 

## 2018-07-22 NOTE — H&P (Signed)
History and Physical    [name] LEX:[id number] DOB: 1940-09-01 DOA: 07/22/2018  I have briefly reviewed the patient's prior medical records in [place]  PCP: Copland, Gay Filler, MD  Patient coming from: Home  Chief Complaint: Right hip pain  HPI: [name] is a 77 y.o. female with medical history significant of coronary artery disease, hypertension, hyperlipidemia, chronic left knee infections on suppressive antibiotics, followed by ID as an outpatient, presents to the hospital with chief complaint of right hip pain.  Patient was in her normal state of health, about 3 to 4 days ago (last Friday) she fell in her home.  She initially got up without difficulties, however she has had progressive right hip pain and now is unable to ambulate.  She denies any fever or chills, denies any cough or chest congestion, no chest pain, no palpitations, no dysuria.  Prior to the fall she has been in her normal state of health.  At baseline she is independent, occasionally uses a walker, no longer drives.  The son is in the room and he provides her with help at home however he does not live with her  ED Course: In the emergency room her vital signs are stable, his blood work is fairly unremarkable.  Hip x-ray showed no evidence of hip or femur fracture but he did show right superior ramus fracture.  Orthopedic surgery was consulted, recommended nonoperative management, will admit to the hospital for pain control, PT to evaluate, may need SNF as patient lives by herself  Review of Systems: As per HPI otherwise 10 point review of systems negative.   Past Medical History:  Diagnosis Date  . Arthritis   . CAD (coronary artery disease)    a. 02/2007 s/p PCI/DES ot LCX and RCA;  b. 07/2014 MV: no ischemia/infarct, EF 68%.  . Carotid arterial disease ([place])    a. 05/2015 Carotid U/S: RICA 4-49%, LICA 67-59%, RECA 1-63%, LECA >50% - overall stable, f/u 1 yr.  . Complication of anesthesia    Three days after procedure pt statrted to suffer with memoy loss that took about 4-5 days to come back according to [name]."  . Dilated cardiomyopathy ([place])    a. 05/2005 Echo: EF 65%; b. 01/22/2016 Echo: EF 25-30% (in setting of admission for urosepsis w/ elev trop); c. 01/26/2016 Echo: EF 35-40%, inf, distal apical, apical, and mid to distal septal HK-->felt to be 2/2 stress induced CM.  [name] Heart murmur   . History of bronchitis   . Hyperlipidemia   . Hypertension   . Hypertensive heart disease   . Memory impairment   . MRSA (methicillin resistant staph aureus) culture positive   . Nephrolithiasis    a. 12/2015 UVJ stone w/ hydroureteronephrosis req nephrostomy tube placement.  . Osteoarthritis    a. 02/4664 s/p L TKA complicated by infections req skin grafting.  [name] PVD (peripheral vascular disease) ([place])    a. Bilat common iliac dzs, nl ABI's --> med rx.  . Sepsis ([place])    a. 12/2015 admitted with Proteus mirabilis urosepsis/bacteremia.  . Wears glasses     Past Surgical History:  Procedure Laterality Date  . ABDOMINAL HYSTERECTOMY    . APPLICATION OF A-CELL OF EXTREMITY Left 12/08/2015   Procedure: APPLICATION OF A-CELL OF knee;  Surgeon: Loel Lofty [name], DO;  Location: [facility];  Service: Plastics;  Laterality: Left;  . APPLICATION OF WOUND VAC Left 11/16/2015   Procedure: APPLICATION OF WOUND VAC;  Surgeon: Loel Lofty [name], DO;  Location: [facility];  Service: Plastics;  Laterality: Left;  . APPLICATION OF WOUND VAC Left 12/08/2015   Procedure: APPLICATION OF WOUND VAC  AND CAST to knee;  Surgeon: Loel Lofty [name], DO;  Location: [facility];  Service: Plastics;  Laterality: Left;  . COLONOSCOPY    . CORONARY ANGIOPLASTY  07   3 stents placed  . ESOPHAGOGASTRODUODENOSCOPY    . EYE SURGERY Bilateral    cataracts  . FREE FLAP TO EXTREMITY Left 11/16/2015   Procedure: FREE FLAP TO EXTREMITY;  Surgeon: Loel Lofty [name], DO;  Location: [facility];  Service: Plastics;  Laterality: Left;  .  I&D EXTREMITY Left 10/18/2015   Procedure: Left Knee Washout, Reclosure;  Surgeon: Newt Minion, MD;  Location: [facility];  Service: Orthopedics;  Laterality: Left;  . I&D EXTREMITY Left 12/08/2015   Procedure: IRRIGATION AND DEBRIDEMENT knee;  Surgeon: Loel Lofty [name], DO;  Location: [facility];  Service: Plastics;  Laterality: Left;  . I&D KNEE WITH POLY EXCHANGE Left 10/12/2015   Procedure: Irrigation and Debridement , Poly Exchange, Antibiotic Beads Left Knee;  Surgeon: Newt Minion, MD;  Location: [facility];  Service: Orthopedics;  Laterality: Left;  . INCISION AND DRAINAGE OF WOUND Left 01/04/2016   Procedure: DEBRIDEMENT OF LEFT KNEE WOUND WITH ACELL AND WOUND VAC PLACEMENT;  Surgeon: Loel Lofty [name], DO;  Location: [facility];  Service: Plastics;  Laterality: Left;  . IR GENERIC HISTORICAL  05/11/2016   IR FLUORO GUIDE CV MIDLINE PICC RIGHT 05/11/2016 Sandi Mariscal, MD [facility]  . IR GENERIC HISTORICAL  05/11/2016   IR US GUIDE VASC ACCESS RIGHT 05/11/2016 Sandi Mariscal, MD [facility]  . KNEE CLOSED REDUCTION Left 01/16/2017   Procedure: CLOSED MANIPULATION LEFT KNEE;  Surgeon: Leandrew Koyanagi, MD;  Location: [facility];  Service: Orthopedics;  Laterality: Left;  . KNEE SURGERY     denies  . SKIN SPLIT GRAFT Left 11/16/2015   Procedure: LEFT GASTROC MUSCLE FLAP WITH SKIN GRAFT SPLIT THICKNESS AND VAC PLACEMENT;  Surgeon: Loel Lofty [name], DO;  Location: [facility];  Service: Plastics;  Laterality: Left;  . TOTAL HIP ARTHROPLASTY Right 09/10/2012   Dr Sharol Given  . TOTAL HIP ARTHROPLASTY Right 09/10/2012   Procedure: TOTAL HIP ARTHROPLASTY;  Surgeon: Newt Minion, MD;  Location: [facility];  Service: Orthopedics;  Laterality: Right;  Right Total Hip Arthroplasty  . TOTAL HIP ARTHROPLASTY Left 06/27/2016   Procedure: LEFT TOTAL HIP ARTHROPLASTY ANTERIOR APPROACH;  Surgeon: Leandrew Koyanagi, MD;  Location: [facility];  Service: Orthopedics;  Laterality: Left;  . TOTAL KNEE ARTHROPLASTY Left 09/28/2015    Procedure: TOTAL KNEE ARTHROPLASTY;  Surgeon: Newt Minion, MD;  Location: [facility];  Service: Orthopedics;  Laterality: Left;     reports that she quit smoking about 2 years ago. Her smoking use included cigarettes. She has a 25.00 pack-year smoking history. She has never used smokeless tobacco. She reports that she does not drink alcohol or use drugs.  No Known Allergies  Family History  Problem Relation Age of Onset  . Breast cancer Mother   . Leukemia Father   . Stroke Sister   . Bone cancer Other     Prior to Admission medications   Medication Sig Start Date End Date Taking? Authorizing Provider  amLODipine (NORVASC) 5 MG tablet Take 1 tablet (5 mg total) by mouth daily. 04/29/18 07/28/18 Yes Lelon Perla, MD  aspirin EC 81 MG tablet  Take 81 mg by mouth daily.   Yes [provider]  atorvastatin (LIPITOR) 40 MG tablet TAKE ONE TABLET BY MOUTH DAILY Patient taking differently: Take 40 mg by mouth daily at 6 PM.  02/05/18  Yes Crenshaw, Denice Bors, MD  Calcium Carbonate-Vit D-Min (CALCIUM 1200 PO) Take 1 tablet by mouth daily.    Yes [provider]  carvedilol (COREG) 12.5 MG tablet Take 1 tablet (12.5 mg total) by mouth 2 (two) times daily. 03/18/18 07/22/18 Yes Lelon Perla, MD  doxycycline (VIBRA-TABS) 100 MG tablet Take 100 mg by mouth 2 (two) times daily. 02/05/18  Yes [provider]  furosemide (LASIX) 20 MG tablet TAKE ONE TABLET BY MOUTH DAILY Patient taking differently: Take 20 mg by mouth daily.  06/17/17  Yes Lelon Perla, MD  lisinopril (PRINIVIL,ZESTRIL) 40 MG tablet Take 1 tablet (40 mg total) by mouth daily. 06/13/18 09/11/18 Yes Lelon Perla, MD  Magnesium Oxide (MAG-OXIDE) 200 MG TABS Take 200 mg by mouth every morning.    Yes [provider]  mesalamine (LIALDA) 1.2 g EC tablet Take 2.4 g by mouth daily with breakfast. 04/11/16 07/22/18 Yes [provider]  Multiple Vitamin (MULITIVITAMIN WITH MINERALS) TABS  Take 1 tablet by mouth daily.   Yes [provider]  naproxen sodium (ALEVE) 220 MG tablet Take 220 mg by mouth as needed (pain).    Yes [provider]  potassium chloride SA (K-DUR,KLOR-CON) 20 MEQ tablet TAKE ONE TABLET BY MOUTH DAILY Patient taking differently: Take 20 mEq by mouth daily.  02/06/18  Yes Lelon Perla, MD  Probiotic Product (PROBIOTIC DAILY PO) Take 1 capsule by mouth daily.   Yes [provider]  rifampin (RIFADIN) 300 MG capsule Take 300 mg by mouth 2 (two) times daily. 02/28/18  Yes [provider]    Physical Exam: Vitals:   07/22/18 1230  BP: (!) 169/58  Pulse: 66  Resp: 18  SpO2: 97%      Constitutional: NAD, calm, comfortable Eyes: PERRL, lids and conjunctivae normal ENMT: Mucous membranes are moist. Neck: normal, supple Respiratory: clear to auscultation bilaterally, no wheezing, no crackles. Normal respiratory effort.  Cardiovascular: Regular rate and rhythm, no murmurs / rubs / gallops. No extremity edema.  Abdomen: no tenderness, no masses palpated. Bowel sounds positive.  Musculoskeletal: no clubbing / cyanosis.  Skin: no rashes, lesions, ulcers.  Neurologic: No focal deficits Psychiatric: Normal judgment and insight. Alert and oriented x 3. Normal mood.   Labs on Admission: I have personally reviewed following labs and imaging studies  CBC: Recent Labs  Lab 07/22/18 1155  WBC 6.8  HGB 13.8  HCT 38.6  MCV 100.0  PLT 010   Basic Metabolic Panel: Recent Labs  Lab 07/22/18 1155  NA 139  K 4.3  CL 105  CO2 24  GLUCOSE 119*  BUN 16  CREATININE 0.91  CALCIUM 9.2   GFR: CrCl cannot be calculated (Unknown ideal weight.). Liver Function Tests: No results for input(s): AST, ALT, ALKPHOS, BILITOT, PROT, ALBUMIN in the last 168 hours. No results for input(s): LIPASE, AMYLASE in the last 168 hours. No results for input(s): AMMONIA in the last 168 hours. Coagulation Profile: No results for input(s):  INR, PROTIME in the last 168 hours. Cardiac Enzymes: No results for input(s): CKTOTAL, CKMB, CKMBINDEX, TROPONINI in the last 168 hours. BNP (last 3 results) No results for input(s): PROBNP in the last 8760 hours. HbA1C: No results for input(s): HGBA1C in the last  72 hours. CBG: No results for input(s): GLUCAP in the last 168 hours. Lipid Profile: No results for input(s): CHOL, HDL, LDLCALC, TRIG, CHOLHDL, LDLDIRECT in the last 72 hours. Thyroid Function Tests: No results for input(s): TSH, T4TOTAL, FREET4, T3FREE, THYROIDAB in the last 72 hours. Anemia Panel: No results for input(s): VITAMINB12, FOLATE, FERRITIN, TIBC, IRON, RETICCTPCT in the last 72 hours. Urine analysis:    Component Value Date/Time   COLORURINE ORANGE (A) 06/29/2016 1455   APPEARANCEUR CLEAR 06/29/2016 1455   LABSPEC 1.012 06/29/2016 1455   PHURINE 5.0 06/29/2016 1455   GLUCOSEU NEGATIVE 06/29/2016 1455   HGBUR NEGATIVE 06/29/2016 1455   BILIRUBINUR NEGATIVE 06/29/2016 1455   KETONESUR NEGATIVE 06/29/2016 1455   PROTEINUR NEGATIVE 06/29/2016 1455   UROBILINOGEN 0.2 09/19/2011 1703   NITRITE NEGATIVE 06/29/2016 1455   LEUKOCYTESUR TRACE (A) 06/29/2016 1455     Radiological Exams on Admission: Dg Pelvis 1-2 Views  Result Date: 07/22/2018 CLINICAL DATA:  Fall with right hip pain. EXAM: PELVIS - 1-2 VIEW COMPARISON:  06/27/2016 FINDINGS: Previous bilateral hip replacements. Acute superior pubic ramus fracture on the right. Possible nondisplaced inferior ramus fracture. No other visible pelvic injury. IMPRESSION: Acute superior pubic ramus fracture on the right with possible nondisplaced inferior ramus fracture. Electronically Signed   By: Nelson Chimes M.D.   On: 07/22/2018 11:31   Dg Femur Min 2 Views Right  Result Date: 07/22/2018 CLINICAL DATA:  Fall. Right hip pain. EXAM: RIGHT FEMUR 2 VIEWS COMPARISON:  Pelvis same day FINDINGS: Previous hip replacement. No evidence of pelvic fracture. Osteoarthritis of  the knee. Superior ramus fracture on the right as shown at pelvic radiography. IMPRESSION: No evidence of hip or femur fracture. Right superior ramus fracture. Electronically Signed   By: Nelson Chimes M.D.   On: 07/22/2018 11:32   Assessment/Plan Active Problems:   Closed fracture of multiple pubic rami (HCC)   Principal problem Pubic rami fracture -Conservative management, appreciate orthopedic surgery consult.  Will focus on pain control as well as physical therapy evaluation.  Patient and the son quite concerned about her returning home and they are inquiring about SNF.  Additional problems Chronic knee MRSA infection -Followed by Dr. Megan Salon as an outpatient, continue doxycycline and rifampin  Hypertension -Continue home medications with Coreg, lisinopril, furosemide  Hyperlipidemia -Continue atorvastatin  CAD -No symptoms, no chest pain  COPD -No wheezing, this appears stable, placed on PRN albuterol   DVT prophylaxis: Lovenox Code Status: DNR Family Communication: Son present at bedside Disposition Plan: Admit to [facility], Home versus SNF when stable Consults called: Orthopedic surgery   Marzetta Board, MD, PhD [facility] Pager [phone]  If 7PM-7AM, please contact night-coverage [email or web address] Password [email or web address]  07/22/2018, 1:45 PM

## 2018-07-22 NOTE — Consult Note (Signed)
Reason for Consult:Pubic ramus fx Referring Physician: D Kursten Kruk is an 78 y.o. female.  HPI: [name] lost her balance and fell on Friday. She hurt in her pelvis when she fell but it wasn't terrible. She was able to get up and bear weight on her right leg. However, over the next several days the pain kept getting worse until the point today when she thought she needed to seek help. She c/o localized pain to the front of the right hemipelvis that's exacerbated by weightbearing and alleviated by lying or sitting. There is no radiation. She doesn't feel safe going back home as she lives alone.  Past Medical History:  Diagnosis Date  . Arthritis   . CAD (coronary artery disease)    a. 02/2007 s/p PCI/DES ot LCX and RCA;  b. 07/2014 MV: no ischemia/infarct, EF 68%.  . Carotid arterial disease ([place])    a. 05/2015 Carotid U/S: RICA 3-15%, LICA 40-08%, RECA 6-76%, LECA >50% - overall stable, f/u 1 yr.  . Complication of anesthesia    Three days after procedure pt statrted to suffer with memoy loss that took about 4-5 days to come back according to [name]."  . Dilated cardiomyopathy ([place])    a. 05/2005 Echo: EF 65%; b. 01/22/2016 Echo: EF 25-30% (in setting of admission for urosepsis w/ elev trop); c. 01/26/2016 Echo: EF 35-40%, inf, distal apical, apical, and mid to distal septal HK-->felt to be 2/2 stress induced CM.  [name] Heart murmur   . History of bronchitis   . Hyperlipidemia   . Hypertension   . Hypertensive heart disease   . Memory impairment   . MRSA (methicillin resistant staph aureus) culture positive   . Nephrolithiasis    a. 12/2015 UVJ stone w/ hydroureteronephrosis req nephrostomy tube placement.  . Osteoarthritis    a. 07/9507 s/p L TKA complicated by infections req skin grafting.  [name] PVD (peripheral vascular disease) ([place])    a. Bilat common iliac dzs, nl ABI's --> med rx.  . Sepsis ([place])    a. 12/2015 admitted with Proteus mirabilis urosepsis/bacteremia.  . Wears glasses      Past Surgical History:  Procedure Laterality Date  . ABDOMINAL HYSTERECTOMY    . APPLICATION OF A-CELL OF EXTREMITY Left 12/08/2015   Procedure: APPLICATION OF A-CELL OF knee;  Surgeon: Loel Lofty [name], DO;  Location: [facility];  Service: Plastics;  Laterality: Left;  . APPLICATION OF WOUND VAC Left 11/16/2015   Procedure: APPLICATION OF WOUND VAC;  Surgeon: Loel Lofty [name], DO;  Location: [facility];  Service: Plastics;  Laterality: Left;  . APPLICATION OF WOUND VAC Left 12/08/2015   Procedure: APPLICATION OF WOUND VAC  AND CAST to knee;  Surgeon: Loel Lofty [name], DO;  Location: [facility];  Service: Plastics;  Laterality: Left;  . COLONOSCOPY    . CORONARY ANGIOPLASTY  07   3 stents placed  . ESOPHAGOGASTRODUODENOSCOPY    . EYE SURGERY Bilateral    cataracts  . FREE FLAP TO EXTREMITY Left 11/16/2015   Procedure: FREE FLAP TO EXTREMITY;  Surgeon: Loel Lofty [name], DO;  Location: [facility];  Service: Plastics;  Laterality: Left;  . I&D EXTREMITY Left 10/18/2015   Procedure: Left Knee Washout, Reclosure;  Surgeon: Newt Minion, MD;  Location: [facility];  Service: Orthopedics;  Laterality: Left;  . I&D EXTREMITY Left 12/08/2015   Procedure: IRRIGATION AND DEBRIDEMENT knee;  Surgeon: Loel Lofty [name], DO;  Location: [facility];  Service: Plastics;  Laterality: Left;  . I&D KNEE WITH POLY EXCHANGE Left 10/12/2015   Procedure: Irrigation and Debridement , Poly Exchange, Antibiotic Beads Left Knee;  Surgeon: Newt Minion, MD;  Location: [facility];  Service: Orthopedics;  Laterality: Left;  . INCISION AND DRAINAGE OF WOUND Left 01/04/2016   Procedure: DEBRIDEMENT OF LEFT KNEE WOUND WITH ACELL AND WOUND VAC PLACEMENT;  Surgeon: Loel Lofty [name], DO;  Location: [facility];  Service: Plastics;  Laterality: Left;  . IR GENERIC HISTORICAL  05/11/2016   IR FLUORO GUIDE CV MIDLINE PICC RIGHT 05/11/2016 Sandi Mariscal, MD [facility]  . IR GENERIC HISTORICAL  05/11/2016   IR US GUIDE VASC ACCESS RIGHT 05/11/2016 Sandi Mariscal, MD [facility]  . KNEE CLOSED REDUCTION Left 01/16/2017   Procedure: CLOSED MANIPULATION LEFT KNEE;  Surgeon: Leandrew Koyanagi, MD;  Location: [facility];  Service: Orthopedics;  Laterality: Left;  . KNEE SURGERY     denies  . SKIN SPLIT GRAFT Left 11/16/2015   Procedure: LEFT GASTROC MUSCLE FLAP WITH SKIN GRAFT SPLIT THICKNESS AND VAC PLACEMENT;  Surgeon: Loel Lofty [name], DO;  Location: [facility];  Service: Plastics;  Laterality: Left;  . TOTAL HIP ARTHROPLASTY Right 09/10/2012   Dr Sharol Given  . TOTAL HIP ARTHROPLASTY Right 09/10/2012   Procedure: TOTAL HIP ARTHROPLASTY;  Surgeon: Newt Minion, MD;  Location: [facility];  Service: Orthopedics;  Laterality: Right;  Right Total Hip Arthroplasty  . TOTAL HIP ARTHROPLASTY Left 06/27/2016   Procedure: LEFT TOTAL HIP ARTHROPLASTY ANTERIOR APPROACH;  Surgeon: Leandrew Koyanagi, MD;  Location: [facility];  Service: Orthopedics;  Laterality: Left;  . TOTAL KNEE ARTHROPLASTY Left 09/28/2015   Procedure: TOTAL KNEE ARTHROPLASTY;  Surgeon: Newt Minion, MD;  Location: [facility];  Service: Orthopedics;  Laterality: Left;    Family History  Problem Relation Age of Onset  . Breast cancer Mother   . Leukemia Father   . Stroke Sister   . Bone cancer Other     Social History:  reports that she quit smoking about 2 years ago. Her smoking use included cigarettes. She has a 25.00 pack-year smoking history. She has never used smokeless tobacco. She reports that she does not drink alcohol or use drugs.  Allergies: No Known Allergies  Medications: I have reviewed the patient's current medications.  Results for orders placed or performed during the hospital encounter of 07/22/18 (from the past 48 hour(s))  CBC     Status: Abnormal   Collection Time: 07/22/18 11:55 AM  Result Value Ref Range   WBC 6.8 4.0 - 10.5 K/uL   RBC 3.86 (L) 3.87 - 5.11 MIL/uL   Hemoglobin 13.8 12.0 - 15.0 g/dL   HCT 38.6 36.0 - 46.0 %   MCV 100.0 80.0 - 100.0 fL   MCH 35.8 (H) 26.0  - 34.0 pg   MCHC 35.8 30.0 - 36.0 g/dL   RDW 13.3 11.5 - 15.5 %   Platelets 173 150 - 400 K/uL   nRBC 0.0 0.0 - 0.2 %    Comment: Performed at [facility], [place] [address] [place], [place] [address]  Basic metabolic panel     Status: Abnormal   Collection Time: 07/22/18 11:55 AM  Result Value Ref Range   Sodium 139 135 - 145 mmol/L   Potassium 4.3 3.5 - 5.1 mmol/L   Chloride 105 98 - 111 mmol/L   CO2 24 22 - 32 mmol/L   Glucose, Bld 119 (H) 70 - 99  mg/dL   BUN 16 8 - 23 mg/dL   Creatinine, Ser 0.91 0.44 - 1.00 mg/dL   Calcium 9.2 8.9 - 10.3 mg/dL   GFR calc non Af Amer >60 >60 mL/min   GFR calc Af Amer >60 >60 mL/min   Anion gap 10 5 - 15    Comment: Performed at [facility] [address] [place], [place] [address]    Dg Pelvis 1-2 Views  Result Date: 07/22/2018 CLINICAL DATA:  Fall with right hip pain. EXAM: PELVIS - 1-2 VIEW COMPARISON:  06/27/2016 FINDINGS: Previous bilateral hip replacements. Acute superior pubic ramus fracture on the right. Possible nondisplaced inferior ramus fracture. No other visible pelvic injury. IMPRESSION: Acute superior pubic ramus fracture on the right with possible nondisplaced inferior ramus fracture. Electronically Signed   By: Nelson Chimes M.D.   On: 07/22/2018 11:31   Dg Femur Min 2 Views Right  Result Date: 07/22/2018 CLINICAL DATA:  Fall. Right hip pain. EXAM: RIGHT FEMUR 2 VIEWS COMPARISON:  Pelvis same day FINDINGS: Previous hip replacement. No evidence of pelvic fracture. Osteoarthritis of the knee. Superior ramus fracture on the right as shown at pelvic radiography. IMPRESSION: No evidence of hip or femur fracture. Right superior ramus fracture. Electronically Signed   By: Nelson Chimes M.D.   On: 07/22/2018 11:32    Review of Systems  Constitutional: Negative for weight loss.  HENT: Negative for ear discharge, ear pain, hearing loss and tinnitus.   Eyes: Negative for blurred vision, double vision, photophobia and pain.   Respiratory: Negative for cough, sputum production and shortness of breath.   Cardiovascular: Negative for chest pain.  Gastrointestinal: Negative for abdominal pain, nausea and vomiting.  Genitourinary: Negative for dysuria, flank pain, frequency and urgency.  Musculoskeletal: Positive for joint pain (Right pelvis). Negative for back pain, falls, myalgias and neck pain.  Neurological: Negative for dizziness, tingling, sensory change, focal weakness, loss of consciousness and headaches.  Endo/Heme/Allergies: Does not bruise/bleed easily.  Psychiatric/Behavioral: Negative for depression, memory loss and substance abuse. The patient is not nervous/anxious.    Blood pressure (!) 169/58, pulse 66, resp. rate 18, SpO2 97 %. Physical Exam  Constitutional: She appears well-developed and well-nourished. No distress.  HENT:  Head: Normocephalic and atraumatic.  Eyes: Conjunctivae are normal. Right eye exhibits no discharge. Left eye exhibits no discharge. No scleral icterus.  Neck: Normal range of motion.  Cardiovascular: Normal rate and regular rhythm.  Respiratory: Effort normal. No respiratory distress.  Musculoskeletal:     Comments: RLE No traumatic wounds, ecchymosis, or rash  TTP ant pelvis  No knee or ankle effusion  Knee stable to varus/ valgus and anterior/posterior stress  Sens DPN, SPN, TN intact  Motor EHL, ext, flex, evers 5/5  DP 1+, PT 0, No significant edema  Neurological: She is alert.  Skin: Skin is warm and dry. She is not diaphoretic.  Psychiatric: She has a normal mood and affect. Her behavior is normal.    Assessment/Plan: Right sup pubic ramus fx -- Non-operative, WBAT RLE. Given her living status and her struggles, even with a RW, I suspect the best course of action will be for medicine to admit her to work on pain control and PT/OT, possibly with discharge to some sort of rehab. She may f/u with Dr. Ninfa Linden 2-3 weeks after discharge.    Lisette Abu,  PA-C Orthopedic Surgery [phone] 07/22/2018, 1:22 PM

## 2018-07-22 NOTE — ED Provider Notes (Signed)
[facility] EMERGENCY DEPARTMENT Provider Note   CSN: [id number] Arrival date & time: 07/22/18  1049     History   Chief Complaint Chief Complaint  Patient presents with  . Hip Pain    HPI [name] is a 77 y.o. female.  [name] is a 77 y.o. female with a history of CAD, cardiomyopathy, hypertension, hyperlipidemia, multiple joint replacements, who presents to the emergency department for evaluation of right hip pain after a fall.  She reports she fell on 12/20 when she lost her balance while walking in her kitchen.  No syncope.  She did not hit her head during the fall but landed on her right hip.  She reports initially she had some mild pain but continued to ambulate on the hip but over the past few days pain has gotten significantly worse over the anterior and lateral aspects of the hip and she is now unable to bear weight without assistance.  Denies any numbness tingling or weakness in the leg.  She took a tramadol without improvement in her pain just prior to calling EMS.  Patient reports that she lives at home by herself, but her son came to stay with her this morning when she called him regarding the severity of her pain.  She has had bilateral hip replacements and a left knee replacement.  No blood thinners.     Past Medical History:  Diagnosis Date  . Arthritis   . CAD (coronary artery disease)    a. 02/2007 s/p PCI/DES ot LCX and RCA;  b. 07/2014 MV: no ischemia/infarct, EF 68%.  . Carotid arterial disease ([place])    a. 05/2015 Carotid U/S: RICA 9-32%, LICA 67-12%, RECA 4-58%, LECA >50% - overall stable, f/u 1 yr.  . Complication of anesthesia    Three days after procedure pt statrted to suffer with memoy loss that took about 4-5 days to come back according to pt son [name]."  . Dilated cardiomyopathy ([place])    a. 05/2005 Echo: EF 65%; b. 01/22/2016 Echo: EF 25-30% (in setting of admission for urosepsis w/ elev trop); c. 01/26/2016 Echo: EF 35-40%,  inf, distal apical, apical, and mid to distal septal HK-->felt to be 2/2 stress induced CM.  [name] Heart murmur   . History of bronchitis   . Hyperlipidemia   . Hypertension   . Hypertensive heart disease   . Memory impairment   . MRSA (methicillin resistant staph aureus) culture positive   . Nephrolithiasis    a. 12/2015 UVJ stone w/ hydroureteronephrosis req nephrostomy tube placement.  . Osteoarthritis    a. 0/9983 s/p L TKA complicated by infections req skin grafting.  [name] PVD (peripheral vascular disease) ([place])    a. Bilat common iliac dzs, nl ABI's --> med rx.  . Sepsis ([place])    a. 12/2015 admitted with Proteus mirabilis urosepsis/bacteremia.  . Wears glasses     Patient Active Problem List   Diagnosis Date Noted  . Lateral dislocation of left patella 01/15/2017  . Unilateral primary osteoarthritis, right knee 01/15/2017  . Osteoporosis 11/26/2016  . Acute pain of left knee 08/27/2016  . Chronic diarrhea 08/07/2016  . Left displaced femoral neck fracture ([place]) 06/26/2016  . MRSA (methicillin resistant Staphylococcus aureus) infection 06/26/2016  . Hypertensive heart disease   . Hydronephrosis   . Obstructive uropathy   . Abnormal echocardiogram 01/23/2016  . Pressure ulcer 01/21/2016  . Chronic infection of prosthetic knee ([place]) 10/12/2015  . AAA 01/19/2009  .  ILIAC ARTERY ANEURYSM 01/19/2009  . Hyperlipidemia 01/14/2009  . TOBACCO ABUSE 01/14/2009  . Essential hypertension 01/14/2009  . Coronary atherosclerosis 01/14/2009  . Cerebrovascular disease 01/14/2009  . Peripheral vascular disease ([place]) 01/14/2009  . CHRONIC OBSTRUCTIVE PULMONARY DISEASE 01/14/2009    Past Surgical History:  Procedure Laterality Date  . ABDOMINAL HYSTERECTOMY    . APPLICATION OF A-CELL OF EXTREMITY Left 12/08/2015   Procedure: APPLICATION OF A-CELL OF knee;  Surgeon: Loel Lofty [name], DO;  Location: [facility];  Service: Plastics;  Laterality: Left;  . APPLICATION OF WOUND VAC Left 11/16/2015    Procedure: APPLICATION OF WOUND VAC;  Surgeon: Loel Lofty [name], DO;  Location: [facility];  Service: Plastics;  Laterality: Left;  . APPLICATION OF WOUND VAC Left 12/08/2015   Procedure: APPLICATION OF WOUND VAC  AND CAST to knee;  Surgeon: Loel Lofty [name], DO;  Location: [facility];  Service: Plastics;  Laterality: Left;  . COLONOSCOPY    . CORONARY ANGIOPLASTY  07   3 stents placed  . ESOPHAGOGASTRODUODENOSCOPY    . EYE SURGERY Bilateral    cataracts  . FREE FLAP TO EXTREMITY Left 11/16/2015   Procedure: FREE FLAP TO EXTREMITY;  Surgeon: Loel Lofty [name], DO;  Location: [facility];  Service: Plastics;  Laterality: Left;  . I&D EXTREMITY Left 10/18/2015   Procedure: Left Knee Washout, Reclosure;  Surgeon: Newt Minion, MD;  Location: [facility];  Service: Orthopedics;  Laterality: Left;  . I&D EXTREMITY Left 12/08/2015   Procedure: IRRIGATION AND DEBRIDEMENT knee;  Surgeon: Loel Lofty [name], DO;  Location: [facility];  Service: Plastics;  Laterality: Left;  . I&D KNEE WITH POLY EXCHANGE Left 10/12/2015   Procedure: Irrigation and Debridement , Poly Exchange, Antibiotic Beads Left Knee;  Surgeon: Newt Minion, MD;  Location: [facility];  Service: Orthopedics;  Laterality: Left;  . INCISION AND DRAINAGE OF WOUND Left 01/04/2016   Procedure: DEBRIDEMENT OF LEFT KNEE WOUND WITH ACELL AND WOUND VAC PLACEMENT;  Surgeon: Loel Lofty [name], DO;  Location: [facility];  Service: Plastics;  Laterality: Left;  . IR GENERIC HISTORICAL  05/11/2016   IR FLUORO GUIDE CV MIDLINE PICC RIGHT 05/11/2016 Sandi Mariscal, MD [facility]  . IR GENERIC HISTORICAL  05/11/2016   IR US GUIDE VASC ACCESS RIGHT 05/11/2016 Sandi Mariscal, MD [facility]  . KNEE CLOSED REDUCTION Left 01/16/2017   Procedure: CLOSED MANIPULATION LEFT KNEE;  Surgeon: Leandrew Koyanagi, MD;  Location: [facility];  Service: Orthopedics;  Laterality: Left;  . KNEE SURGERY     denies  . SKIN SPLIT GRAFT Left 11/16/2015   Procedure: LEFT GASTROC MUSCLE FLAP WITH  SKIN GRAFT SPLIT THICKNESS AND VAC PLACEMENT;  Surgeon: Loel Lofty [name], DO;  Location: [facility];  Service: Plastics;  Laterality: Left;  . TOTAL HIP ARTHROPLASTY Right 09/10/2012   Dr Sharol Given  . TOTAL HIP ARTHROPLASTY Right 09/10/2012   Procedure: TOTAL HIP ARTHROPLASTY;  Surgeon: Newt Minion, MD;  Location: [facility];  Service: Orthopedics;  Laterality: Right;  Right Total Hip Arthroplasty  . TOTAL HIP ARTHROPLASTY Left 06/27/2016   Procedure: LEFT TOTAL HIP ARTHROPLASTY ANTERIOR APPROACH;  Surgeon: Leandrew Koyanagi, MD;  Location: [facility];  Service: Orthopedics;  Laterality: Left;  . TOTAL KNEE ARTHROPLASTY Left 09/28/2015   Procedure: TOTAL KNEE ARTHROPLASTY;  Surgeon: Newt Minion, MD;  Location: [facility];  Service: Orthopedics;  Laterality: Left;     OB History   No obstetric history on file.  Home Medications    Prior to Admission medications   Medication Sig Start Date End Date Taking? Authorizing Provider  amLODipine (NORVASC) 5 MG tablet Take 1 tablet (5 mg total) by mouth daily. 04/29/18 07/28/18 Yes Lelon Perla, MD  aspirin EC 81 MG tablet Take 81 mg by mouth daily.   Yes [provider]  atorvastatin (LIPITOR) 40 MG tablet TAKE ONE TABLET BY MOUTH DAILY Patient taking differently: Take 40 mg by mouth daily at 6 PM.  02/05/18  Yes Crenshaw, Denice Bors, MD  Calcium Carbonate-Vit D-Min (CALCIUM 1200 PO) Take 1 tablet by mouth daily.    Yes [provider]  carvedilol (COREG) 12.5 MG tablet Take 1 tablet (12.5 mg total) by mouth 2 (two) times daily. 03/18/18 07/22/18 Yes Lelon Perla, MD  doxycycline (VIBRA-TABS) 100 MG tablet Take 100 mg by mouth 2 (two) times daily. 02/05/18  Yes [provider]  furosemide (LASIX) 20 MG tablet TAKE ONE TABLET BY MOUTH DAILY Patient taking differently: Take 20 mg by mouth daily.  06/17/17  Yes Lelon Perla, MD  lisinopril (PRINIVIL,ZESTRIL) 40 MG tablet Take 1 tablet (40 mg total) by mouth daily. 06/13/18 09/11/18 Yes  Lelon Perla, MD  Magnesium Oxide (MAG-OXIDE) 200 MG TABS Take 200 mg by mouth every morning.    Yes [provider]  mesalamine (LIALDA) 1.2 g EC tablet Take 2.4 g by mouth daily with breakfast. 04/11/16 07/22/18 Yes [provider]  Multiple Vitamin (MULITIVITAMIN WITH MINERALS) TABS Take 1 tablet by mouth daily.   Yes [provider]  naproxen sodium (ALEVE) 220 MG tablet Take 220 mg by mouth as needed (pain).    Yes [provider]  potassium chloride SA (K-DUR,KLOR-CON) 20 MEQ tablet TAKE ONE TABLET BY MOUTH DAILY Patient taking differently: Take 20 mEq by mouth daily.  02/06/18  Yes Lelon Perla, MD  Probiotic Product (PROBIOTIC DAILY PO) Take 1 capsule by mouth daily.   Yes [provider]  rifampin (RIFADIN) 300 MG capsule Take 300 mg by mouth 2 (two) times daily. 02/28/18  Yes [provider]    Family History Family History  Problem Relation Age of Onset  . Breast cancer Mother   . Leukemia Father   . Stroke Sister   . Bone cancer Other     Social History Social History   Tobacco Use  . Smoking status: Former Smoker    Packs/day: 0.50    Years: 50.00    Pack years: 25.00    Types: Cigarettes    Last attempt to quit: 09/28/2015    Years since quitting: 2.8  . Smokeless tobacco: Never Used  . Tobacco comment: quit 09/27/15  Substance Use Topics  . Alcohol use: No  . Drug use: No     Allergies   Patient has no known allergies.   Review of Systems Review of Systems  Constitutional: Negative for chills and fever.  HENT: Negative.   Eyes: Negative for visual disturbance.  Respiratory: Negative for cough and shortness of breath.   Cardiovascular: Negative for chest pain.  Gastrointestinal: Negative for abdominal pain, nausea and vomiting.  Genitourinary: Negative for dysuria and frequency.  Musculoskeletal: Positive for arthralgias. Negative for back pain, joint swelling and neck pain.  Skin: Negative for  color change, rash and wound.  Neurological: Negative for weakness, numbness and headaches.  All other systems reviewed and are negative.    Physical Exam Updated Vital Signs BP (!) 169/58   Pulse  66   Resp 18   SpO2 97%   Physical Exam Vitals signs and nursing note reviewed.  Constitutional:      General: She is not in acute distress.    Appearance: Normal appearance. She is well-developed. She is not ill-appearing or diaphoretic.  HENT:     Head: Normocephalic and atraumatic.     Mouth/Throat:     Mouth: Mucous membranes are moist.  Eyes:     General:        Right eye: No discharge.        Left eye: No discharge.     Pupils: Pupils are equal, round, and reactive to light.  Neck:     Musculoskeletal: Neck supple.  Cardiovascular:     Rate and Rhythm: Normal rate and regular rhythm.     Pulses: Normal pulses.     Heart sounds: Normal heart sounds. No murmur. No friction rub. No gallop.   Pulmonary:     Effort: Pulmonary effort is normal. No respiratory distress.     Breath sounds: Normal breath sounds. No wheezing or rales.     Comments: Respirations equal and unlabored, patient able to speak in full sentences, lungs clear to auscultation bilaterally Abdominal:     General: Abdomen is flat. Bowel sounds are normal. There is no distension.     Palpations: Abdomen is soft. There is no mass.     Tenderness: There is no abdominal tenderness. There is no guarding.     Comments: Abdomen soft, nondistended, nontender to palpation in all quadrants without guarding or peritoneal signs  Musculoskeletal:     Comments: Tenderness to palpation of the anterior and lateral aspects of the right hip, no pelvic instability on palpation.  No overlying hematoma or skin changes.  Range of motion intact, but causes pain.  Patient unable to bear weight due to pain.  No pain or deformity at the knee or ankle.  2+ DP and TP pulses.  The right lower extremity is warm and well perfused.  Good  sensation and 5/5 strength with dorsi and plantar flexion. No midline spinal tenderness.  Skin:    General: Skin is warm and dry.     Capillary Refill: Capillary refill takes less than 2 seconds.  Neurological:     Mental Status: She is alert and oriented to person, place, and time. Mental status is at baseline.     Coordination: Coordination normal.     Comments: Speech is clear, able to follow commands CN III-XII intact Normal strength in upper and lower extremities bilaterally including dorsiflexion and plantar flexion, strong and equal grip strength Sensation normal to light and sharp touch Moves extremities without ataxia, coordination intact   Psychiatric:        Mood and Affect: Mood normal.        Behavior: Behavior normal.      ED Treatments / Results  Labs (all labs ordered are listed, but only abnormal results are displayed) Labs Reviewed  CBC - Abnormal; Notable for the following components:      Result Value   RBC 3.86 (*)    MCH 35.8 (*)    All other components within normal limits  BASIC METABOLIC PANEL - Abnormal; Notable for the following components:   Glucose, Bld 119 (*)    All other components within normal limits  URINALYSIS, ROUTINE W REFLEX MICROSCOPIC - Abnormal; Notable for the following components:   Specific Gravity, Urine 1.002 (*)    All other components within  normal limits    EKG None  Radiology Dg Pelvis 1-2 Views  Result Date: 07/22/2018 CLINICAL DATA:  Fall with right hip pain. EXAM: PELVIS - 1-2 VIEW COMPARISON:  06/27/2016 FINDINGS: Previous bilateral hip replacements. Acute superior pubic ramus fracture on the right. Possible nondisplaced inferior ramus fracture. No other visible pelvic injury. IMPRESSION: Acute superior pubic ramus fracture on the right with possible nondisplaced inferior ramus fracture. Electronically Signed   By: Nelson Chimes M.D.   On: 07/22/2018 11:31   Dg Femur Min 2 Views Right  Result Date:  07/22/2018 CLINICAL DATA:  Fall. Right hip pain. EXAM: RIGHT FEMUR 2 VIEWS COMPARISON:  Pelvis same day FINDINGS: Previous hip replacement. No evidence of pelvic fracture. Osteoarthritis of the knee. Superior ramus fracture on the right as shown at pelvic radiography. IMPRESSION: No evidence of hip or femur fracture. Right superior ramus fracture. Electronically Signed   By: Nelson Chimes M.D.   On: 07/22/2018 11:32    Procedures Procedures (including critical care time)  Medications Ordered in ED Medications  fentaNYL (SUBLIMAZE) injection 50 mcg (50 mcg Intravenous Given 07/22/18 1147)     Initial Impression / Assessment and Plan / ED Course  I have reviewed the triage vital signs and the nursing notes.  Pertinent labs & imaging results that were available during my care of the patient were reviewed by me and considered in my medical decision making (see chart for details).  Patient presents for evaluation of persistent right hip pain after a mechanical fall in her kitchen on Friday.  No other injuries from the fall.  History of prior hip replacement.  No hematoma, or obvious shortening or rotation on exam.  Patient is not on any blood thinners.  On exam she has tenderness over the anterior and lateral right hip without appreciable deformity, no overlying ecchymosis or erythema.  Range of motion limited by pain and patient unable to bear weight.  No evidence of other injuries from the fall.  Courtney get x-rays of the right hip and femur, and basic labs.  Prior hip replacement done by Dr. Sharol Given per chart review.  X-rays reveal a right superior rami fracture, no other fractures noted.  Labs overall reassuring, no leukocytosis, normal hemoglobin, no acute electrolyte derangements, normal renal function and no signs of urinary tract infection.  Case discussed with Orion Crook, PA from orthopedics who Courtney see and evaluate the patient, reports pubic rami fractures typically are not surgical but  patient may require admission with medicine for PT depending on weightbearing ability and pain control.  1:23 PM seen and evaluated by Orion Crook, PA with orthopedics who recommends medicine admission for physical therapy, patient may weight-bear as tolerated.  1:27 PM Case discussed with Dr. Cruzita Lederer with [facility] who Courtney see and admit the patient.   Final Clinical Impressions(s) / ED Diagnoses   Final diagnoses:  Fall, initial encounter  Closed fracture of superior ramus of right pubis, initial encounter [facility])    ED Discharge Orders    None       Jacqlyn Larsen, [place] 07/22/18 1416    Drenda Freeze, MD 07/23/18 [phone]

## 2018-07-22 NOTE — ED Triage Notes (Signed)
Pt BIB [facility] from home c/o right hip pain. Pt lost her balance and fell in her kitchen on Friday. Pt had difficult time walking last night and pain became worse this morning. Able to stand and pivot for EMS. Able to move all extremities.

## 2018-07-23 DIAGNOSIS — S32511A Fracture of superior rim of right pubis, initial encounter for closed fracture: Secondary | ICD-10-CM | POA: Diagnosis not present

## 2018-07-23 DIAGNOSIS — S32591A Other specified fracture of right pubis, initial encounter for closed fracture: Secondary | ICD-10-CM | POA: Diagnosis not present

## 2018-07-23 LAB — COMPREHENSIVE METABOLIC PANEL
ALT: 15 U/L (ref 0–44)
AST: 23 U/L (ref 15–41)
Albumin: 2.9 g/dL — ABNORMAL LOW (ref 3.5–5.0)
Alkaline Phosphatase: 74 U/L (ref 38–126)
Anion gap: 9 (ref 5–15)
BILIRUBIN TOTAL: 0.9 mg/dL (ref 0.3–1.2)
BUN: 17 mg/dL (ref 8–23)
CO2: 24 mmol/L (ref 22–32)
Calcium: 8.7 mg/dL — ABNORMAL LOW (ref 8.9–10.3)
Chloride: 105 mmol/L (ref 98–111)
Creatinine, Ser: 0.92 mg/dL (ref 0.44–1.00)
GFR calc Af Amer: >60 mL/min (ref >60)
GFR calc non Af Amer: >60 mL/min (ref >60)
Glucose, Bld: 103 mg/dL — ABNORMAL HIGH (ref 70–99)
Potassium: 4.1 mmol/L (ref 3.5–5.1)
Sodium: 138 mmol/L (ref 135–145)
TOTAL PROTEIN: 6.1 g/dL — AB (ref 6.5–8.1)

## 2018-07-23 LAB — CBC
HCT: 37.7 % (ref 36.0–46.0)
Hemoglobin: 12.9 g/dL (ref 12.0–15.0)
MCH: 33.9 pg (ref 26.0–34.0)
MCHC: 34.2 g/dL (ref 30.0–36.0)
MCV: 99 fL (ref 80.0–100.0)
PLATELETS: 177 10*3/uL (ref 150–400)
RBC: 3.81 MIL/uL — ABNORMAL LOW (ref 3.87–5.11)
RDW: 13 % (ref 11.5–15.5)
WBC: 6.3 10*3/uL (ref 4.0–10.5)
nRBC: 0 % (ref 0.0–0.2)

## 2018-07-23 MED ORDER — ACETAMINOPHEN 325 MG PO TABS
650.0000 mg | ORAL_TABLET | Freq: Four times a day (QID) | ORAL | Status: DC | PRN
  Administered 2018-07-23 (×2): 650 mg via ORAL
  Filled 2018-07-23 (×2): qty 2

## 2018-07-23 NOTE — Progress Notes (Signed)
Pt temp 100.2 MD notified asked for tylenol awaiting call back.

## 2018-07-23 NOTE — Progress Notes (Signed)
Order received for tylenol PRN

## 2018-07-23 NOTE — Progress Notes (Signed)
PROGRESS NOTE    [name]  ZOX:[id number] DOB: [birth year] DOA: 07/22/2018 PCP: Darreld Mclean, MD   Brief Narrative: Patient is a 77 year old female with past medical history of coronary disease, hypertension, hyperlipidemia, chronic left knee infection on suppressive antibiotics being followed by ID as an outpatient who presents to the hospital with complaints of fall and pain in the right hip.  She lives alone at home.  Hip x-ray showed right superior ramus fracture.  Orthopedics consulted.  Plan for nonoperative management.  PT evaluated her today and recommended skilled services on discharge.  Assessment & Plan:   Active Problems:   Closed fracture of multiple pubic rami (HCC)   Closed fracture of right superior rim of pubis (HCC)  Right superior pubic rami fracture: Orthopedics was consulted.  Plan for conservative management.  Continue pain management.  PT evaluation done.  Recommend skilled nursing facility.  Social worker consulted today.  Chronic knee MRSA infection: Being followed by Dr. Megan Salon as an outpatient.  On doxycycline and rifampin which we will continue.  Hypertension: Mildly hypertensive this morning.  Continue her home medications Coreg, lisinopril, amlodipine.  Continue to monitor blood pressure  Hyperlipidemia: Continue Lipitor  History of coronary artery disease: Does not complain of any chest pain.  Continue current medications  History of COPD: Currently stable.  No wheezing.  Continue as needed bronchodilators.  Patient is medically stable for discharge to SNF as soon as the bed is available.         DVT prophylaxis: Lovenox Code Status: Full Family Communication: None present at the bedside Disposition Plan:    Consultants: Orthopedics  Procedures:None  Antimicrobials:None  Subjective: Patient seen and examined the bedside this morning.  Sitting in the chair.  Comfortable.  Denies any pain.  Participate with physical therapy  today.  Objective: Vitals:   07/22/18 1415 07/22/18 1524 07/22/18 2055 07/23/18 0452  BP: 138/60 (!) 154/69 (!) 160/83 (!) 165/67  Pulse: 70 70 77 82  Resp:   15 20  Temp:  97.8 F (36.6 C) 98 F (36.7 C) 98.4 F (36.9 C)  TempSrc:  Oral Oral Oral  SpO2: 90% 94% 96% 98%    Intake/Output Summary (Last 24 hours) at 07/23/2018 1235 Last data filed at 07/23/2018 0809 Gross per 24 hour  Intake 480 ml  Output 500 ml  Net -20 ml   There were no vitals filed for this visit.  Examination:  General exam: Appears calm and comfortable ,Not in distress,average built HEENT:PERRL,Oral mucosa moist, Ear/Nose normal on gross exam Respiratory system: Bilateral equal air entry, normal vesicular breath sounds, no wheezes or crackles  Cardiovascular system: S1 & S2 heard, RRR. No JVD, murmurs, rubs, gallops or clicks. No pedal edema. Gastrointestinal system: Abdomen is nondistended, soft and nontender. No organomegaly or masses felt. Normal bowel sounds heard. Central nervous system: Alert and oriented. No focal neurological deficits. Extremities: No edema, no clubbing ,no cyanosis, distal peripheral pulses palpable. Skin: No rashes, lesions or ulcers,no icterus ,no pallor MSK: Normal muscle bulk,tone ,power Psychiatry: Judgement and insight appear normal. Mood & affect appropriate.     Data Reviewed: I have personally reviewed following labs and imaging studies  CBC: Recent Labs  Lab 07/22/18 1155 07/23/18 0206  WBC 6.8 6.3  HGB 13.8 12.9  HCT 38.6 37.7  MCV 100.0 99.0  PLT 173 811   Basic Metabolic Panel: Recent Labs  Lab 07/22/18 1155 07/23/18 0206  NA 139 138  K 4.3 4.1  CL 105  105  CO2 24 24  GLUCOSE 119* 103*  BUN 16 17  CREATININE 0.91 0.92  CALCIUM 9.2 8.7*   GFR: CrCl cannot be calculated (Unknown ideal weight.). Liver Function Tests: Recent Labs  Lab 07/23/18 0206  AST 23  ALT 15  ALKPHOS 74  BILITOT 0.9  PROT 6.1*  ALBUMIN 2.9*   No results for  input(s): LIPASE, AMYLASE in the last 168 hours. No results for input(s): AMMONIA in the last 168 hours. Coagulation Profile: No results for input(s): INR, PROTIME in the last 168 hours. Cardiac Enzymes: No results for input(s): CKTOTAL, CKMB, CKMBINDEX, TROPONINI in the last 168 hours. BNP (last 3 results) No results for input(s): PROBNP in the last 8760 hours. HbA1C: No results for input(s): HGBA1C in the last 72 hours. CBG: No results for input(s): GLUCAP in the last 168 hours. Lipid Profile: No results for input(s): CHOL, HDL, LDLCALC, TRIG, CHOLHDL, LDLDIRECT in the last 72 hours. Thyroid Function Tests: No results for input(s): TSH, T4TOTAL, FREET4, T3FREE, THYROIDAB in the last 72 hours. Anemia Panel: No results for input(s): VITAMINB12, FOLATE, FERRITIN, TIBC, IRON, RETICCTPCT in the last 72 hours. Sepsis Labs: No results for input(s): PROCALCITON, LATICACIDVEN in the last 168 hours.  No results found for this or any previous visit (from the past 240 hour(s)).       Radiology Studies: Dg Pelvis 1-2 Views  Result Date: 07/22/2018 CLINICAL DATA:  Fall with right hip pain. EXAM: PELVIS - 1-2 VIEW COMPARISON:  06/27/2016 FINDINGS: Previous bilateral hip replacements. Acute superior pubic ramus fracture on the right. Possible nondisplaced inferior ramus fracture. No other visible pelvic injury. IMPRESSION: Acute superior pubic ramus fracture on the right with possible nondisplaced inferior ramus fracture. Electronically Signed   By: Nelson Chimes M.D.   On: 07/22/2018 11:31   Dg Femur Min 2 Views Right  Result Date: 07/22/2018 CLINICAL DATA:  Fall. Right hip pain. EXAM: RIGHT FEMUR 2 VIEWS COMPARISON:  Pelvis same day FINDINGS: Previous hip replacement. No evidence of pelvic fracture. Osteoarthritis of the knee. Superior ramus fracture on the right as shown at pelvic radiography. IMPRESSION: No evidence of hip or femur fracture. Right superior ramus fracture. Electronically  Signed   By: Nelson Chimes M.D.   On: 07/22/2018 11:32        Scheduled Meds: . amLODipine  5 mg Oral Daily  . aspirin EC  81 mg Oral Daily  . atorvastatin  40 mg Oral q1800  . carvedilol  12.5 mg Oral BID WC  . doxycycline  100 mg Oral BID  . enoxaparin (LOVENOX) injection  40 mg Subcutaneous Q24H  . furosemide  20 mg Oral Daily  . lisinopril  40 mg Oral Daily  . mesalamine  2.4 g Oral Q breakfast  . rifampin  300 mg Oral BID   Continuous Infusions:   LOS: 0 days    Time spent: More than 50% of that time was spent in counseling and/or coordination of care.      Shelly Coss, MD [facility] Pager [phone]  If 7PM-7AM, please contact night-coverage [email or web address] Password [facility] 07/23/2018, 12:35 PM

## 2018-07-23 NOTE — Evaluation (Signed)
Physical Therapy Evaluation Patient Details Name: [name] MRN: [id number] DOB: [birth year] Today's Date: 07/23/2018   History of Present Illness  Pt is a 77 y.o. female admitted 07/22/18 after falling at home (on 12/20) with progressive worsening R hip pain. Imaging showed R supervision pubic ramus fx; ortho recommended nonoperative management (WBAT). PMH includes bilateral THA and TKA, CAD, HTN, OA, PVD.    Clinical Impression  Pt presents with an overall decrease in functional mobility secondary to above. PTA, pt mod indep with RW/rollator and lives alone; family available for PRN support but cannot stay with pt. Educ on RLE WBAT precautions. Today, pt able to transfer and ambulate in room with RW and intermittent minA; limited by pain. Pt would benefit from continued acute PT services to maximize functional mobility and independence prior to d/c with SNF-level therapies.     Follow Up Recommendations SNF;Supervision for mobility/OOB    Equipment Recommendations  None recommended by PT    Recommendations for Other Services       Precautions / Restrictions Precautions Precautions: Fall Restrictions Weight Bearing Restrictions: Yes RLE Weight Bearing: Weight bearing as tolerated      Mobility  Bed Mobility Overal bed mobility: Needs Assistance Bed Mobility: Supine to Sit     Supine to sit: Supervision     General bed mobility comments: Increased time and effort secondary to pain and stiffness  Transfers Overall transfer level: Needs assistance Equipment used: Rolling walker (2 wheeled) Transfers: Sit to/from Stand Sit to Stand: Min assist         General transfer comment: Increased time and effort secondary to pain; minA to maintain balance with transition of BUEs from bed to RW  Ambulation/Gait Ambulation/Gait assistance: Min assist;Min guard Gait Distance (Feet): 16 Feet Assistive device: Rolling walker (2 wheeled) Gait Pattern/deviations: Step-to  pattern;Decreased weight shift to right;Trunk flexed;Antalgic Gait velocity: Decreased Gait velocity interpretation: <1.31 ft/sec, indicative of household ambulator General Gait Details: Slow, antalgic ambulation with RW and intermittent minA for balance; pt limited by pain. Able to WB somewhat through RLE   Stairs            Wheelchair Mobility    Modified Rankin (Stroke Patients Only)       Balance Overall balance assessment: Needs assistance   Sitting balance-Leahy Scale: Good Sitting balance - Comments: Able to don L sock sitting EOB; required assist with R sock secondary to pain     Standing balance-Leahy Scale: Poor Standing balance comment: Reliant on UE support                             Pertinent Vitals/Pain Pain Assessment: 0-10 Pain Score: 6  Pain Location: R hip Pain Descriptors / Indicators: Sore;Grimacing;Guarding Pain Intervention(s): Monitored during session;Premedicated before session    Home Living Family/patient expects to be discharged to:: Skilled nursing facility Living Arrangements: Alone Available Help at Discharge: Family;Available PRN/intermittently   Home Access: Stairs to enter Entrance Stairs-Rails: Right Entrance Stairs-Number of Steps: 5-10 Home Layout: One level Home Equipment: Walker - 2 wheels;Walker - 4 wheels      Prior Function Level of Independence: Independent with assistive device(s)         Comments: Consistently uses either RW or rollator     Hand Dominance        Extremity/Trunk Assessment   Upper Extremity Assessment Upper Extremity Assessment: Overall WFL for tasks assessed    Lower Extremity Assessment Lower Extremity Assessment:  RLE deficits/detail RLE Deficits / Details: s/p R superior pubic ramus fx; knee flex/ext >3/5 RLE: Unable to fully assess due to pain RLE Coordination: decreased gross motor       Communication   Communication: No difficulties  Cognition Arousal/Alertness:  Awake/alert Behavior During Therapy: WFL for tasks assessed/performed Overall Cognitive Status: Within Functional Limits for tasks assessed                                 General Comments: WFL for simple tasks      General Comments      Exercises     Assessment/Plan    PT Assessment Patient needs continued PT services  PT Problem List Decreased strength;Decreased range of motion;Decreased activity tolerance;Decreased balance;Decreased mobility;Decreased knowledge of use of DME;Decreased knowledge of precautions;Pain       PT Treatment Interventions DME instruction;Gait training;Functional mobility training;Stair training;Therapeutic activities;Therapeutic exercise;Balance training;Patient/family education    PT Goals (Current goals can be found in the Care Plan section)  Acute Rehab PT Goals Patient Stated Goal: SNF before return home PT Goal Formulation: With patient Time For Goal Achievement: 08/06/18 Potential to Achieve Goals: Good    Frequency Min 3X/week   Barriers to discharge Decreased caregiver support      Co-evaluation               AM-PAC PT "6 Clicks" Mobility  Outcome Measure Help needed turning from your back to your side while in a flat bed without using bedrails?: A Little Help needed moving from lying on your back to sitting on the side of a flat bed without using bedrails?: A Little Help needed moving to and from a bed to a chair (including a wheelchair)?: A Little Help needed standing up from a chair using your arms (e.g., wheelchair or bedside chair)?: A Little Help needed to walk in hospital room?: A Little Help needed climbing 3-5 steps with a railing? : A Lot 6 Click Score: 17    End of Session Equipment Utilized During Treatment: Gait belt Activity Tolerance: Patient tolerated treatment well;Patient limited by pain Patient left: in chair;with call bell/phone within reach;with chair alarm set Nurse Communication:  Mobility status PT Visit Diagnosis: Other abnormalities of gait and mobility (R26.89);Pain Pain - Right/Left: Right Pain - part of body: Hip    Time: 0827-0849 PT Time Calculation (min) (ACUTE ONLY): 22 min   Charges:   PT Evaluation $PT Eval Moderate Complexity: 1 Mod        [name], PT, DPT Acute Rehabilitation Services  Pager [phone] Office [phone]  Derry Lory 07/23/2018, 9:11 AM

## 2018-07-24 ENCOUNTER — Ambulatory Visit (INDEPENDENT_AMBULATORY_CARE_PROVIDER_SITE_OTHER): Payer: Medicare Other | Admitting: Physician Assistant

## 2018-07-24 DIAGNOSIS — S32511A Fracture of superior rim of right pubis, initial encounter for closed fracture: Secondary | ICD-10-CM | POA: Diagnosis not present

## 2018-07-24 DIAGNOSIS — S32591A Other specified fracture of right pubis, initial encounter for closed fracture: Secondary | ICD-10-CM | POA: Diagnosis not present

## 2018-07-24 NOTE — NC FL2 (Signed)
[place] LEVEL OF CARE SCREENING TOOL     IDENTIFICATION  Patient Name: [name] Birthdate: January 30, 1941 Sex: female Admission Date (Current Location): 07/22/2018  [facility] and [place] Number:  Herbalist and Address:  [facility] [place], [place] [address], [place], [place] [address]      Provider Number: [id number]  Attending Physician Name and Address:  Shelly Coss, MD  Relative Name and Phone Number:  [name] (son) [phone]    Current Level of Care: Hospital Recommended Level of Care: [place] Prior Approval Number:    Date Approved/Denied:   PASRR Number: [id number]  Discharge Plan: SNF    Current Diagnoses: Patient Active Problem List   Diagnosis Date Noted  . Closed fracture of multiple pubic rami ([place]) 07/22/2018  . Closed fracture of right superior rim of pubis ([place] )   . Lateral dislocation of left patella 01/15/2017  . Unilateral primary osteoarthritis, right knee 01/15/2017  . Osteoporosis 11/26/2016  . Acute pain of left knee 08/27/2016  . Chronic diarrhea 08/07/2016  . Left displaced femoral neck fracture ([place]) 06/26/2016  . MRSA (methicillin resistant Staphylococcus aureus) infection 06/26/2016  . Hypertensive heart disease   . Hydronephrosis   . Obstructive uropathy   . Abnormal echocardiogram 01/23/2016  . Pressure ulcer 01/21/2016  . Chronic infection of prosthetic knee ([place]) 10/12/2015  . AAA 01/19/2009  . ILIAC ARTERY ANEURYSM 01/19/2009  . Hyperlipidemia 01/14/2009  . TOBACCO ABUSE 01/14/2009  . Essential hypertension 01/14/2009  . Coronary atherosclerosis 01/14/2009  . Cerebrovascular disease 01/14/2009  . Peripheral vascular disease ([place]) 01/14/2009  . CHRONIC OBSTRUCTIVE PULMONARY DISEASE 01/14/2009    Orientation RESPIRATION BLADDER Height & Weight     Self, Time, Place, Situation  Normal External catheter, Continent Weight:   Height:     BEHAVIORAL SYMPTOMS/MOOD  NEUROLOGICAL BOWEL NUTRITION STATUS      Continent Diet(see discharge summary)  AMBULATORY STATUS COMMUNICATION OF NEEDS Skin   Extensive Assist Verbally Normal                       Personal Care Assistance Level of Assistance  Bathing, Feeding, Dressing, Total care Bathing Assistance: Maximum assistance Feeding assistance: Independent Dressing Assistance: Maximum assistance Total Care Assistance: Maximum assistance   Functional Limitations Info  Speech, Sight, Hearing Sight Info: Adequate Hearing Info: Adequate Speech Info: Adequate    SPECIAL CARE FACTORS FREQUENCY  PT (By licensed PT), OT (By licensed OT)     PT Frequency: min 5x weekly OT Frequency: min 5x weekly            Contractures Contractures Info: Not present    Additional Factors Info  Code Status, Allergies Code Status Info: DNR Allergies Info: no known allergies           Current Medications (07/24/2018):  This is the current hospital active medication list Current Facility-Administered Medications  Medication Dose Route Frequency Provider Last Rate Last Dose  . acetaminophen (TYLENOL) tablet 650 mg  650 mg Oral Q6H PRN Shelly Coss, MD   650 mg at 07/23/18 2006  . amLODipine (NORVASC) tablet 5 mg  5 mg Oral Daily Caren Griffins, MD   5 mg at 07/24/18 0805  . aspirin EC tablet 81 mg  81 mg Oral Daily Caren Griffins, MD   81 mg at 07/24/18 0805  . atorvastatin (LIPITOR) tablet 40 mg  40 mg Oral q1800 Caren Griffins, MD   40 mg  at 07/23/18 1709  . carvedilol (COREG) tablet 12.5 mg  12.5 mg Oral BID WC Caren Griffins, MD   12.5 mg at 07/24/18 0804  . doxycycline (VIBRA-TABS) tablet 100 mg  100 mg Oral BID Caren Griffins, MD   100 mg at 07/24/18 0804  . enoxaparin (LOVENOX) injection 40 mg  40 mg Subcutaneous Q24H Marzetta Board M, MD   40 mg at 07/23/18 2006  . furosemide (LASIX) tablet 20 mg  20 mg Oral Daily Caren Griffins, MD   20 mg at 07/24/18 0805  .  ipratropium-albuterol (DUONEB) 0.5-2.5 (3) MG/3ML nebulizer solution 3 mL  3 mL Nebulization Q6H PRN Caren Griffins, MD      . lisinopril (PRINIVIL,ZESTRIL) tablet 40 mg  40 mg Oral Daily Caren Griffins, MD   40 mg at 07/24/18 0805  . mesalamine (LIALDA) EC tablet 2.4 g  2.4 g Oral Q breakfast Caren Griffins, MD   2.4 g at 07/24/18 0805  . morphine 2 MG/ML injection 2 mg  2 mg Intravenous Q4H PRN Caren Griffins, MD      . oxyCODONE (Oxy IR/ROXICODONE) immediate release tablet 5 mg  5 mg Oral Q4H PRN Caren Griffins, MD   5 mg at 07/24/18 1033  . rifampin (RIFADIN) capsule 300 mg  300 mg Oral BID Caren Griffins, MD   300 mg at 07/24/18 0102     Discharge Medications: Please see discharge summary for a list of discharge medications.  Relevant Imaging Results:  Relevant Lab Results:   Additional Information SSN: [id number]  Alberteen Sam, LCSW

## 2018-07-24 NOTE — Progress Notes (Signed)
PROGRESS NOTE    [name]  MVH:[id number] DOB: 11/16/1940 DOA: 07/22/2018 PCP: Darreld Mclean, MD   Brief Narrative: Patient is a 77 year old female with past medical history of coronary disease, hypertension, hyperlipidemia, chronic left knee infection on suppressive antibiotics being followed by ID as an outpatient who presents to the hospital with complaints of fall and pain in the right hip.  She lives alone at home.  Hip x-ray showed right superior ramus fracture.  Orthopedics consulted.  Plan for nonoperative management.  PT evaluated her today and recommended skilled services on discharge. DC planning tomorrow.  Assessment & Plan:   Active Problems:   Closed fracture of multiple pubic rami (HCC)   Closed fracture of right superior rim of pubis (HCC)  Right superior pubic rami fracture: Orthopedics was consulted.  Plan for conservative management.  Continue pain management.  PT evaluation done.  Recommend skilled nursing facility.  Social worker following.  Chronic knee MRSA infection: Being followed by Dr. Megan Salon as an outpatient.  On doxycycline and rifampin which we will continue.  Hypertension: Mildly hypertensive this morning.  Continue her home medications Coreg, lisinopril, amlodipine.  Continue to monitor blood pressure  Hyperlipidemia: Continue Lipitor  History of coronary artery disease: Does not complain of any chest pain.  Continue current medications  History of COPD: Currently stable.  No wheezing.  Continue as needed bronchodilators.  Patient is medically stable for discharge to SNF as soon as the bed is available.         DVT prophylaxis: Lovenox Code Status: Full Family Communication: None present at the bedside Disposition Plan: SNF tomorrow   Consultants: Orthopedics  Procedures:None  Antimicrobials:None  Subjective: Patient seen and examined the bedside this morning.  Sitting in the chair.  Comfortable.  Denies any pain.     Objective: Vitals:   07/23/18 1825 07/23/18 1925 07/24/18 0547 07/24/18 0804  BP:  130/88 (!) 154/77 (!) 166/81  Pulse:  81 71 79  Resp:      Temp: 97.9 F (36.6 C) 98.1 F (36.7 C) 98.3 F (36.8 C)   TempSrc: Oral Oral Oral   SpO2:  95% 94%     Intake/Output Summary (Last 24 hours) at 07/24/2018 1205 Last data filed at 07/24/2018 0830 Gross per 24 hour  Intake 720 ml  Output 600 ml  Net 120 ml   There were no vitals filed for this visit.  Examination:  General exam: Appears calm and comfortable ,Not in distress,average built HEENT:PERRL,Oral mucosa moist, Ear/Nose normal on gross exam Respiratory system: Bilateral equal air entry, normal vesicular breath sounds, no wheezes or crackles  Cardiovascular system: S1 & S2 heard, RRR. No JVD, murmurs, rubs, gallops or clicks. No pedal edema. Gastrointestinal system: Abdomen is nondistended, soft and nontender. No organomegaly or masses felt. Normal bowel sounds heard. Central nervous system: Alert and oriented. No focal neurological deficits. Extremities: No edema, no clubbing ,no cyanosis, distal peripheral pulses palpable.  Scar from previous surgery on the left knee, no signs of infection Skin: No rashes, lesions or ulcers,no icterus ,no pallor MSK: Normal muscle bulk,tone ,power Psychiatry: Judgement and insight appear normal. Mood & affect appropriate.     Data Reviewed: I have personally reviewed following labs and imaging studies  CBC: Recent Labs  Lab 07/22/18 1155 07/23/18 0206  WBC 6.8 6.3  HGB 13.8 12.9  HCT 38.6 37.7  MCV 100.0 99.0  PLT 173 841   Basic Metabolic Panel: Recent Labs  Lab 07/22/18 1155 07/23/18 0206  NA 139 138  K 4.3 4.1  CL 105 105  CO2 24 24  GLUCOSE 119* 103*  BUN 16 17  CREATININE 0.91 0.92  CALCIUM 9.2 8.7*   GFR: CrCl cannot be calculated (Unknown ideal weight.). Liver Function Tests: Recent Labs  Lab 07/23/18 0206  AST 23  ALT 15  ALKPHOS 74  BILITOT 0.9  PROT  6.1*  ALBUMIN 2.9*   No results for input(s): LIPASE, AMYLASE in the last 168 hours. No results for input(s): AMMONIA in the last 168 hours. Coagulation Profile: No results for input(s): INR, PROTIME in the last 168 hours. Cardiac Enzymes: No results for input(s): CKTOTAL, CKMB, CKMBINDEX, TROPONINI in the last 168 hours. BNP (last 3 results) No results for input(s): PROBNP in the last 8760 hours. HbA1C: No results for input(s): HGBA1C in the last 72 hours. CBG: No results for input(s): GLUCAP in the last 168 hours. Lipid Profile: No results for input(s): CHOL, HDL, LDLCALC, TRIG, CHOLHDL, LDLDIRECT in the last 72 hours. Thyroid Function Tests: No results for input(s): TSH, T4TOTAL, FREET4, T3FREE, THYROIDAB in the last 72 hours. Anemia Panel: No results for input(s): VITAMINB12, FOLATE, FERRITIN, TIBC, IRON, RETICCTPCT in the last 72 hours. Sepsis Labs: No results for input(s): PROCALCITON, LATICACIDVEN in the last 168 hours.  No results found for this or any previous visit (from the past 240 hour(s)).       Radiology Studies: No results found.      Scheduled Meds: . amLODipine  5 mg Oral Daily  . aspirin EC  81 mg Oral Daily  . atorvastatin  40 mg Oral q1800  . carvedilol  12.5 mg Oral BID WC  . doxycycline  100 mg Oral BID  . enoxaparin (LOVENOX) injection  40 mg Subcutaneous Q24H  . furosemide  20 mg Oral Daily  . lisinopril  40 mg Oral Daily  . mesalamine  2.4 g Oral Q breakfast  . rifampin  300 mg Oral BID   Continuous Infusions:   LOS: 0 days    Time spent: 25 mins.More than 50% of that time was spent in counseling and/or coordination of care.      Shelly Coss, MD [facility] Pager [phone]  If 7PM-7AM, please contact night-coverage [email or web address] Password [facility] 07/24/2018, 12:05 PM

## 2018-07-24 NOTE — Clinical Social Work Note (Signed)
Clinical Social Work Assessment  Patient Details  Name: [name] MRN: [id number] Date of Birth: April 19, 1941  Date of referral:  07/24/18               Reason for consult:  Discharge Planning                Permission sought to share information with:  Case Manager, Facility Sport and exercise psychologist, Family Supports Permission granted to share information::  Yes, Verbal Permission Granted  Name::     Set designer::  SNFs  Relationship::  son  Contact Information:  [phone]  Housing/Transportation Living arrangements for the past 2 months:  Single Family Home Source of Information:  Patient Patient Interpreter Needed:  None Criminal Activity/Legal Involvement Pertinent to Current Situation/Hospitalization:    Significant Relationships:  Adult Children Lives with:  Self Do you feel safe going back to the place where you live?  No Need for family participation in patient care:  Yes (Comment)  Care giving concerns:  CSW received referral for possible SNF placement at time of discharge. Spoke with patient regarding possibility of SNF placement . Patient's family is currently unable to care for her at their home given patient's current needs and fall risk.  Patient and son [name] at bedside expressed understanding of PT recommendation and are agreeable to SNF placement at time of discharge. CSW to continue to follow and assist with discharge planning needs.     Social Worker assessment / plan:  Spoke with patient and son [name] at bedside  concerning possibility of rehab at [facility] before returning home.    Employment status:  Retired Forensic scientist:  Medicare PT Recommendations:  [place] / Referral to community resources:  [place]  Patient/Family's Response to care:  Patient and  Son [name] at bedside  recognize need for rehab before returning home and are agreeable to a SNF in [place]. They report preference for  [facility] . CSW  explained insurance authorization process and that this would be private pay as patient has not been inpatient and has Medicare. Patient and son [name] expressed understanding of this and agree to pay.  Patient's family reported that they want patient to get stronger to be able to come back home.    Patient/Family's Understanding of and Emotional Response to Diagnosis, Current Treatment, and Prognosis:  Patient/family is realistic regarding therapy needs and expressed being hopeful for SNF placement. Patient expressed understanding of CSW role and discharge process as well as medical condition. No questions/concerns about plan or treatment.    Emotional Assessment Appearance:  Appears stated age Attitude/Demeanor/Rapport:  Gracious Affect (typically observed):  Accepting, Adaptable Orientation:  Oriented to Self, Oriented to Place, Oriented to  Time, Oriented to Situation Alcohol / Substance use:  Not Applicable Psych involvement (Current and /or in the community):  No (Comment)  Discharge Needs  Concerns to be addressed:  Discharge Planning Concerns Readmission within the last 30 days:  No Current discharge risk:  Dependent with Mobility Barriers to Discharge:  Continued Medical Work up   [employer], LCSW 07/24/2018, 12:04 PM

## 2018-07-24 NOTE — Plan of Care (Signed)
  Problem: Pain Managment: Goal: General experience of comfort will improve Outcome: Progressing   Problem: Safety: Goal: Ability to remain free from injury will improve Outcome: Progressing   

## 2018-07-24 NOTE — Evaluation (Signed)
Occupational Therapy Evaluation Patient Details Name: [name] MRN: [id number] DOB: November 09, 1940 Today's Date: 07/24/2018    History of Present Illness Pt is a 77 y.o. female admitted 07/22/18 after falling at home (on 12/20) with progressive worsening R hip pain. Imaging showed R supervision pubic ramus fx; ortho recommended nonoperative management (WBAT). PMH includes bilateral THA and TKA, CAD, HTN, OA, PVD.   Clinical Impression   PTA Pt mod I in home environment with RW/Rollator and shower seat/grab bars. Pt was doing her own cooking and cleaning and ADL. Pt is currently mod A for LB dressing, mod A to min A for transfers with RW and dependent on sitting position for grooming tasks. Pt will require SNF level care post-acute to maximize safety and independence in ADL and functional transfers. Next session to focus on intro to adaptive equipment, and continued transfer safety and function.    Follow Up Recommendations  SNF;Supervision/Assistance - 24 hour    Equipment Recommendations  Other (comment)(defer to next venue)    Recommendations for Other Services       Precautions / Restrictions Precautions Precautions: Fall Restrictions Weight Bearing Restrictions: Yes RLE Weight Bearing: Weight bearing as tolerated      Mobility Bed Mobility Overal bed mobility: Needs Assistance Bed Mobility: Supine to Sit     Supine to sit: Mod assist     General bed mobility comments: assist for BLE to EOB, assist for trunk elevation  Transfers Overall transfer level: Needs assistance Equipment used: Rolling walker (2 wheeled) Transfers: Sit to/from Stand Sit to Stand: Mod assist         General transfer comment: assist for boost, vc for safe hand placement, increased time and effort    Balance Overall balance assessment: Needs assistance Sitting-balance support: No upper extremity supported;Feet supported Sitting balance-Leahy Scale: Good Sitting balance - Comments:  Able to don L sock sitting EOB; required assist with R sock secondary to pain     Standing balance-Leahy Scale: Poor Standing balance comment: Reliant on UE support                           ADL either performed or assessed with clinical judgement   ADL Overall ADL's : Needs assistance/impaired Eating/Feeding: Modified independent   Grooming: Oral care;Wash/dry face;Wash/dry hands;Set up;Sitting Grooming Details (indicate cue type and reason): unable to perform in standing, requires seated position Upper Body Bathing: Set up;Sitting   Lower Body Bathing: Maximal assistance;Sitting/lateral leans Lower Body Bathing Details (indicate cue type and reason): unable to reach LB or perform bathing/dressing without external assist or AE Upper Body Dressing : Set up;Sitting   Lower Body Dressing: Moderate assistance;Sit to/from stand Lower Body Dressing Details (indicate cue type and reason): unable to thread clothing over feet, assist required to pull items up from knees as patient requires BUEsuport in standing Toilet Transfer: Minimal assistance;Ambulation;RW Toilet Transfer Details (indicate cue type and reason): increased time and effort for mobility, cues for safety with RW as Pt tends to let it get very far ahead Toileting- Clothing Manipulation and Hygiene: Min guard;Sitting/lateral lean Toileting - Clothing Manipulation Details (indicate cue type and reason): leans to the left     Functional mobility during ADLs: Minimal assistance;Cueing for safety;Cueing for sequencing;Rolling walker General ADL Comments: Pt with decreased balance, decreased activity tolerance, decreased access to LB for ADL     Vision         Perception     Praxis  Pertinent Vitals/Pain Pain Assessment: 0-10 Pain Score: 8  Pain Location: R hip Pain Descriptors / Indicators: Sore;Grimacing;Guarding Pain Intervention(s): Monitored during session;Repositioned;Patient requesting pain meds-RN  notified     Hand Dominance Right   Extremity/Trunk Assessment Upper Extremity Assessment Upper Extremity Assessment: Overall WFL for tasks assessed   Lower Extremity Assessment Lower Extremity Assessment: Defer to PT evaluation   Cervical / Trunk Assessment Cervical / Trunk Assessment: Kyphotic   Communication Communication Communication: No difficulties   Cognition Arousal/Alertness: Awake/alert Behavior During Therapy: WFL for tasks assessed/performed Overall Cognitive Status: No family/caregiver present to determine baseline cognitive functioning                                 General Comments: Pt tangential in conversation - and required cues for problem solving - question impact of pain medicines   General Comments  no family present throughout session    Exercises     Shoulder Instructions      Home Living Family/patient expects to be discharged to:: Skilled nursing facility(Pt lives in [place] normally) Living Arrangements: Alone Available Help at Discharge: Family;Available PRN/intermittently   Home Access: Stairs to enter Entrance Stairs-Number of Steps: 5-10 Entrance Stairs-Rails: Right Home Layout: One level               Home Equipment: Walker - 2 wheels;Walker - 4 wheels;Shower seat;Grab bars - tub/shower;Grab bars - toilet;Hand held shower head          Prior Functioning/Environment Level of Independence: Independent with assistive device(s)        Comments: Consistently uses either RW or rollator        OT Problem List: Decreased range of motion;Decreased activity tolerance;Impaired balance (sitting and/or standing);Decreased knowledge of use of DME or AE;Decreased knowledge of precautions;Pain      OT Treatment/Interventions: Self-care/ADL training;Therapeutic exercise;DME and/or AE instruction;Therapeutic activities;Patient/family education;Balance training    OT Goals(Current goals can be found in the care plan  section) Acute Rehab OT Goals Patient Stated Goal: SNF before return home OT Goal Formulation: With patient Time For Goal Achievement: 08/07/18 Potential to Achieve Goals: Good ADL Goals Pt Will Perform Grooming: with supervision;standing Pt Will Perform Lower Body Bathing: with supervision;with adaptive equipment;sit to/from stand Pt Will Perform Lower Body Dressing: with min guard assist;with adaptive equipment;sit to/from stand Pt Will Transfer to Toilet: with supervision;ambulating Pt Will Perform Toileting - Clothing Manipulation and hygiene: with modified independence;sitting/lateral leans Additional ADL Goal #1: Pt will perform bed mobility at mod I level prior to engaging in ADL activity  OT Frequency: Min 2X/week   Barriers to D/C:            Co-evaluation              AM-PAC OT "6 Clicks" Daily Activity     Outcome Measure Help from another person eating meals?: None Help from another person taking care of personal grooming?: A Little Help from another person toileting, which includes using toliet, bedpan, or urinal?: A Little Help from another person bathing (including washing, rinsing, drying)?: A Lot Help from another person to put on and taking off regular upper body clothing?: None Help from another person to put on and taking off regular lower body clothing?: A Lot 6 Click Score: 18   End of Session Equipment Utilized During Treatment: Gait belt;Rolling walker Nurse Communication: Mobility status;Weight bearing status  Activity Tolerance: Patient limited by pain Patient left: in  chair;with call bell/phone within reach;with chair alarm set  OT Visit Diagnosis: Unsteadiness on feet (R26.81);Other abnormalities of gait and mobility (R26.89);History of falling (Z91.81);Pain Pain - Right/Left: Right Pain - part of body: Hip;Leg                Time: 2081-3887 OT Time Calculation (min): 37 min Charges:  OT General Charges $OT Visit: 1 Visit OT  Evaluation $OT Eval Moderate Complexity: 1 Mod OT Treatments $Self Care/Home Management : 8-22 mins  Hulda Humphrey OTR/L Acute Rehabilitation Services Pager: [phone] Office: [place] 07/24/2018, 4:32 PM

## 2018-07-24 NOTE — Progress Notes (Signed)
CSW spoke with family regarding need to private pay for [facility] as patient has been in observation status under medicare and does not meet inpatient criteria. Patient and her son [name] at bedside are in agreement to pay private pay at [place] for skilled nursing.   [facility] admissions is not open today, they will look at referral tomorrow. Anticipated dc tomorrow to [place], doctor made aware.   [place], [place]

## 2018-07-25 DIAGNOSIS — S32511A Fracture of superior rim of right pubis, initial encounter for closed fracture: Secondary | ICD-10-CM | POA: Diagnosis not present

## 2018-07-25 DIAGNOSIS — S32591A Other specified fracture of right pubis, initial encounter for closed fracture: Secondary | ICD-10-CM | POA: Diagnosis not present

## 2018-07-25 MED ORDER — AMLODIPINE BESYLATE 10 MG PO TABS: 10.0000 mg | ORAL_TABLET | Freq: Every day | ORAL | Status: DC

## 2018-07-25 MED ORDER — OXYCODONE HCL 5 MG PO TABS: 5.0000 mg | ORAL_TABLET | ORAL | 0 refills | Status: DC | PRN

## 2018-07-25 MED ORDER — AMLODIPINE BESYLATE 5 MG PO TABS
5.0000 mg | ORAL_TABLET | Freq: Once | ORAL | Status: AC
  Administered 2018-07-25: 5 mg via ORAL
  Filled 2018-07-25: qty 1

## 2018-07-25 NOTE — Clinical Social Work Placement (Signed)
   CLINICAL SOCIAL WORK PLACEMENT  NOTE  Date:  07/25/2018  Patient Details  Name: [name] MRN: [id number] Date of Birth: 06/18/1941  Clinical Social Work is seeking post-discharge placement for this patient at the [place] level of care (*CSW will initial, date and re-position this form in  chart as items are completed):      Patient/family provided with [facility] Work Department's list of facilities offering this level of care within the geographic area requested by the patient (or if unable, by the patient's family).  Yes   Patient/family informed of their freedom to choose among providers that offer the needed level of care, that participate in Medicare, Medicaid or managed care program needed by the patient, have an available bed and are willing to accept the patient.      Patient/family informed of [facility]'s ownership interest in [facility] and [facility], as well as of the fact that they are under no obligation to receive care at these facilities.  PASRR submitted to EDS on       PASRR number received on 09/12/12     Existing PASRR number confirmed on 07/24/18     FL2 transmitted to all facilities in geographic area requested by pt/family on 07/24/18     FL2 transmitted to all facilities within larger geographic area on 07/24/18     Patient informed that his/her managed care company has contracts with or will negotiate with certain facilities, including the following:        Yes   Patient/family informed of bed offers received.  Patient chooses bed at [facility] recommends and patient chooses bed at      Patient to be transferred to [facility] on 07/25/18.  Patient to be transferred to facility by PTAR     Patient family notified on 07/25/18 of transfer.  Name of family member notified:  [name]     PHYSICIAN       Additional Comment:     _______________________________________________ Alberteen Sam, LCSW 07/25/2018, 9:48 AM

## 2018-07-25 NOTE — Progress Notes (Signed)
RN called and gave report to [place], Therapist, sports at [employer].

## 2018-07-25 NOTE — Discharge Summary (Signed)
Physician Discharge Summary  [name] OAC:[id number] DOB: 1940-11-15 DOA: 07/22/2018  PCP: Darreld Mclean, MD  Admit date: 07/22/2018 Discharge date: 07/25/2018  Admitted From: Home Disposition:  Home  Discharge Condition:Stable CODE STATUS: DNR Diet recommendation: Heart Healthy   Brief/Interim Summary: Patient is a 77 year old female with past medical history of coronary disease, hypertension, hyperlipidemia, chronic left knee infection on suppressive antibiotics being followed by ID as an outpatient who presents to the hospital with complaints of fall and pain in the right hip.  She lives alone at home.  Hip x-ray showed right superior ramus fracture.  Orthopedics consulted.  Plan for nonoperative management.  PT evaluated her  and recommended skilled services on discharge. She is stable for discharge to skilled nursing facility today.  Following problems were addressed during her hospitalization:  Right superior pubic rami fracture: Orthopedics was consulted.  Plan for conservative management.  Continue pain management.  PT evaluation done.  Recommend skilled nursing facility.  Social worker was following.  Chronic knee MRSA infection: Being followed by Dr. Megan Salon as an outpatient.  On doxycycline and rifampin which we will continue.  Hypertension: Mildly hypertensive this morning.  Continue her home medications Coreg, lisinopril, amlodipine.  Increased the dose of amlodipine to 10 mg daily  Hyperlipidemia: Continue Lipitor  History of coronary artery disease: Does not complain of any chest pain.  Continue current medications  History of COPD: Currently stable.  No wheezing.   Discharge Diagnoses:  Active Problems:   Closed fracture of multiple pubic rami (HCC)   Closed fracture of right superior rim of pubis [facility])    Discharge Instructions  Discharge Instructions    Diet - low sodium heart healthy   Complete by:  As directed    Discharge instructions    Complete by:  As directed    1)Follow up with orthopedics as an outpatient in 2 weeks. Name and number of the provider has been attached.   Increase activity slowly   Complete by:  As directed      Allergies as of 07/25/2018   No Known Allergies     Medication List    TAKE these medications   amLODipine 10 MG tablet Commonly known as:  NORVASC Take 1 tablet (10 mg total) by mouth daily. Start taking on:  July 26, 2018 What changed:    medication strength  how much to take   aspirin EC 81 MG tablet Take 81 mg by mouth daily.   atorvastatin 40 MG tablet Commonly known as:  LIPITOR TAKE ONE TABLET BY MOUTH DAILY What changed:  when to take this   CALCIUM 1200 PO Take 1 tablet by mouth daily.   carvedilol 12.5 MG tablet Commonly known as:  COREG Take 1 tablet (12.5 mg total) by mouth 2 (two) times daily.   doxycycline 100 MG tablet Commonly known as:  VIBRA-TABS Take 100 mg by mouth 2 (two) times daily.   furosemide 20 MG tablet Commonly known as:  LASIX TAKE ONE TABLET BY MOUTH DAILY   lisinopril 40 MG tablet Commonly known as:  PRINIVIL,ZESTRIL Take 1 tablet (40 mg total) by mouth daily.   MAG-OXIDE 200 MG Tabs Generic drug:  Magnesium Oxide Take 200 mg by mouth every morning.   mesalamine 1.2 g EC tablet Commonly known as:  LIALDA Take 2.4 g by mouth daily with breakfast.   multivitamin with minerals Tabs tablet Take 1 tablet by mouth daily.   naproxen sodium 220 MG tablet Commonly known as:  ALEVE Take 220 mg by mouth as needed (pain).   oxyCODONE 5 MG immediate release tablet Commonly known as:  Oxy IR/ROXICODONE Take 1 tablet (5 mg total) by mouth every 4 (four) hours as needed for breakthrough pain.   potassium chloride SA 20 MEQ tablet Commonly known as:  K-DUR,KLOR-CON TAKE ONE TABLET BY MOUTH DAILY   PROBIOTIC DAILY PO Take 1 capsule by mouth daily.   rifampin 300 MG capsule Commonly known as:  RIFADIN Take 300 mg by mouth 2  (two) times daily.      Follow-up Information    Mcarthur Rossetti, MD. Schedule an appointment as soon as possible for a visit in 2 week(s).   Specialty:  Orthopedic Surgery Contact information: [place] [address] [phone]          No Known Allergies  Consultations:  Orthopedics   Procedures/Studies: Dg Pelvis 1-2 Views  Result Date: 07/22/2018 CLINICAL DATA:  Fall with right hip pain. EXAM: PELVIS - 1-2 VIEW COMPARISON:  06/27/2016 FINDINGS: Previous bilateral hip replacements. Acute superior pubic ramus fracture on the right. Possible nondisplaced inferior ramus fracture. No other visible pelvic injury. IMPRESSION: Acute superior pubic ramus fracture on the right with possible nondisplaced inferior ramus fracture. Electronically Signed   By: Nelson Chimes M.D.   On: 07/22/2018 11:31   Dg Femur Min 2 Views Right  Result Date: 07/22/2018 CLINICAL DATA:  Fall. Right hip pain. EXAM: RIGHT FEMUR 2 VIEWS COMPARISON:  Pelvis same day FINDINGS: Previous hip replacement. No evidence of pelvic fracture. Osteoarthritis of the knee. Superior ramus fracture on the right as shown at pelvic radiography. IMPRESSION: No evidence of hip or femur fracture. Right superior ramus fracture. Electronically Signed   By: Nelson Chimes M.D.   On: 07/22/2018 11:32       Subjective: Patient seen and examined the bedside this morning.  Remains comfortable.  Hemodynamically stable for discharge today.  Discharge Exam: Vitals:   07/24/18 2018 07/25/18 0509  BP: (!) 163/77 (!) 167/78  Pulse: 85 82  Resp: 18 18  Temp: 98.1 F (36.7 C) 98 F (36.7 C)  SpO2: 96% 92%   Vitals:   07/24/18 1346 07/24/18 1704 07/24/18 2018 07/25/18 0509  BP: (!) 157/80 (!) 152/99 (!) 163/77 (!) 167/78  Pulse: 81 92 85 82  Resp: 17  18 18   Temp: 98 F (36.7 C)  98.1 F (36.7 C) 98 F (36.7 C)  TempSrc: Oral  Oral Oral  SpO2: 98%  96% 92%    General: Pt is alert, awake,  not in acute distress Cardiovascular: RRR, S1/S2 +, no rubs, no gallops Respiratory: CTA bilaterally, no wheezing, no rhonchi Abdominal: Soft, NT, ND, bowel sounds + Extremities: no edema, no cyanosis    The results of significant diagnostics from this hospitalization (including imaging, microbiology, ancillary and laboratory) are listed below for reference.     Microbiology: No results found for this or any previous visit (from the past 240 hour(s)).   Labs: BNP (last 3 results) No results for input(s): BNP in the last 8760 hours. Basic Metabolic Panel: Recent Labs  Lab 07/22/18 1155 07/23/18 0206  NA 139 138  K 4.3 4.1  CL 105 105  CO2 24 24  GLUCOSE 119* 103*  BUN 16 17  CREATININE 0.91 0.92  CALCIUM 9.2 8.7*   Liver Function Tests: Recent Labs  Lab 07/23/18 0206  AST 23  ALT 15  ALKPHOS 74  BILITOT 0.9  PROT 6.1*  ALBUMIN 2.9*   No results for input(s): LIPASE, AMYLASE in the last 168 hours. No results for input(s): AMMONIA in the last 168 hours. CBC: Recent Labs  Lab 07/22/18 1155 07/23/18 0206  WBC 6.8 6.3  HGB 13.8 12.9  HCT 38.6 37.7  MCV 100.0 99.0  PLT 173 177   Cardiac Enzymes: No results for input(s): CKTOTAL, CKMB, CKMBINDEX, TROPONINI in the last 168 hours. BNP: Invalid input(s): POCBNP CBG: No results for input(s): GLUCAP in the last 168 hours. D-Dimer No results for input(s): DDIMER in the last 72 hours. Hgb A1c No results for input(s): HGBA1C in the last 72 hours. Lipid Profile No results for input(s): CHOL, HDL, LDLCALC, TRIG, CHOLHDL, LDLDIRECT in the last 72 hours. Thyroid function studies No results for input(s): TSH, T4TOTAL, T3FREE, THYROIDAB in the last 72 hours.  Invalid input(s): FREET3 Anemia work up No results for input(s): VITAMINB12, FOLATE, FERRITIN, TIBC, IRON, RETICCTPCT in the last 72 hours. Urinalysis    Component Value Date/Time   COLORURINE YELLOW 07/22/2018 [place] 07/22/2018 1343    LABSPEC 1.002 (L) 07/22/2018 1343   PHURINE 6.0 07/22/2018 1343   GLUCOSEU NEGATIVE 07/22/2018 1343   HGBUR NEGATIVE 07/22/2018 1343   BILIRUBINUR NEGATIVE 07/22/2018 1343   KETONESUR NEGATIVE 07/22/2018 1343   PROTEINUR NEGATIVE 07/22/2018 1343   UROBILINOGEN 0.2 09/19/2011 1703   NITRITE NEGATIVE 07/22/2018 1343   LEUKOCYTESUR NEGATIVE 07/22/2018 1343   Sepsis Labs Invalid input(s): PROCALCITONIN,  WBC,  LACTICIDVEN Microbiology No results found for this or any previous visit (from the past 240 hour(s)).  Please note: You were cared for by a hospitalist during your hospital stay. Once you are discharged, your primary care physician will handle any further medical issues. Please note that NO REFILLS for any discharge medications will be authorized once you are discharged, as it is imperative that you return to your primary care physician (or establish a relationship with a primary care physician if you do not have one) for your post hospital discharge needs so that they can reassess your need for medications and monitor your lab values.    Time coordinating discharge: 40 minutes  SIGNED:   Shelly Coss, MD  [facility] 07/25/2018, 9:20 AM Pager [phone]  If 7PM-7AM, please contact night-coverage [email or web address] Password [email or web address]

## 2018-07-25 NOTE — Progress Notes (Signed)
Patient will DC to: [facility] Anticipated DC date: 07/25/18 Family notified: [name] Transport by: Corey Harold  Per MD patient ready for DC to [place]. RN, patient, patient's family, and facility notified of DC. Discharge Summary sent to facility. RN given number for report [phone] Room 7001-a. DC packet on chart. Ambulance transport requested for patient.  CSW signing off.  [place], [place]

## 2018-07-25 NOTE — Progress Notes (Signed)
PT iv has been removed, Son packed up all belongings pain medication has been given. Pt comfortable and awaiting PTAR

## 2018-07-27 ENCOUNTER — Other Ambulatory Visit: Payer: Self-pay | Admitting: Cardiology

## 2018-07-27 DIAGNOSIS — R2689 Other abnormalities of gait and mobility: Secondary | ICD-10-CM | POA: Diagnosis not present

## 2018-07-27 DIAGNOSIS — M25551 Pain in right hip: Secondary | ICD-10-CM | POA: Diagnosis not present

## 2018-07-27 DIAGNOSIS — M6281 Muscle weakness (generalized): Secondary | ICD-10-CM | POA: Diagnosis not present

## 2018-07-27 DIAGNOSIS — Z9181 History of falling: Secondary | ICD-10-CM | POA: Diagnosis not present

## 2018-07-27 DIAGNOSIS — S3282XS Multiple fractures of pelvis without disruption of pelvic ring, sequela: Secondary | ICD-10-CM | POA: Diagnosis not present

## 2018-07-27 DIAGNOSIS — S32509D Unspecified fracture of unspecified pubis, subsequent encounter for fracture with routine healing: Secondary | ICD-10-CM | POA: Diagnosis not present

## 2018-07-28 ENCOUNTER — Telehealth: Payer: Self-pay

## 2018-07-28 DIAGNOSIS — S32509D Unspecified fracture of unspecified pubis, subsequent encounter for fracture with routine healing: Secondary | ICD-10-CM | POA: Diagnosis not present

## 2018-07-28 DIAGNOSIS — I2510 Atherosclerotic heart disease of native coronary artery without angina pectoris: Secondary | ICD-10-CM | POA: Diagnosis not present

## 2018-07-28 DIAGNOSIS — M6281 Muscle weakness (generalized): Secondary | ICD-10-CM | POA: Diagnosis not present

## 2018-07-28 DIAGNOSIS — I10 Essential (primary) hypertension: Secondary | ICD-10-CM | POA: Diagnosis not present

## 2018-07-28 DIAGNOSIS — T8454XD Infection and inflammatory reaction due to internal left knee prosthesis, subsequent encounter: Secondary | ICD-10-CM | POA: Diagnosis not present

## 2018-07-28 DIAGNOSIS — M25551 Pain in right hip: Secondary | ICD-10-CM | POA: Diagnosis not present

## 2018-07-28 DIAGNOSIS — S3282XS Multiple fractures of pelvis without disruption of pelvic ring, sequela: Secondary | ICD-10-CM | POA: Diagnosis not present

## 2018-07-28 DIAGNOSIS — S3289XD Fracture of other parts of pelvis, subsequent encounter for fracture with routine healing: Secondary | ICD-10-CM | POA: Diagnosis not present

## 2018-07-28 DIAGNOSIS — R2689 Other abnormalities of gait and mobility: Secondary | ICD-10-CM | POA: Diagnosis not present

## 2018-07-28 DIAGNOSIS — Z9181 History of falling: Secondary | ICD-10-CM | POA: Diagnosis not present

## 2018-07-28 NOTE — Telephone Encounter (Signed)
TCM call made to patient. Son states patient [place] at [facility] at this time and will be there for 2-3 weeks.

## 2018-07-29 DIAGNOSIS — Z9181 History of falling: Secondary | ICD-10-CM | POA: Diagnosis not present

## 2018-07-29 DIAGNOSIS — R2689 Other abnormalities of gait and mobility: Secondary | ICD-10-CM | POA: Diagnosis not present

## 2018-07-29 DIAGNOSIS — S3282XS Multiple fractures of pelvis without disruption of pelvic ring, sequela: Secondary | ICD-10-CM | POA: Diagnosis not present

## 2018-07-29 DIAGNOSIS — M25551 Pain in right hip: Secondary | ICD-10-CM | POA: Diagnosis not present

## 2018-07-29 DIAGNOSIS — M6281 Muscle weakness (generalized): Secondary | ICD-10-CM | POA: Diagnosis not present

## 2018-07-29 DIAGNOSIS — S32509D Unspecified fracture of unspecified pubis, subsequent encounter for fracture with routine healing: Secondary | ICD-10-CM | POA: Diagnosis not present

## 2018-07-31 DIAGNOSIS — M6281 Muscle weakness (generalized): Secondary | ICD-10-CM | POA: Diagnosis not present

## 2018-07-31 DIAGNOSIS — R41841 Cognitive communication deficit: Secondary | ICD-10-CM | POA: Diagnosis not present

## 2018-07-31 DIAGNOSIS — R2681 Unsteadiness on feet: Secondary | ICD-10-CM | POA: Diagnosis not present

## 2018-07-31 DIAGNOSIS — M25551 Pain in right hip: Secondary | ICD-10-CM | POA: Diagnosis not present

## 2018-07-31 DIAGNOSIS — S32509D Unspecified fracture of unspecified pubis, subsequent encounter for fracture with routine healing: Secondary | ICD-10-CM | POA: Diagnosis not present

## 2018-07-31 DIAGNOSIS — Z9181 History of falling: Secondary | ICD-10-CM | POA: Diagnosis not present

## 2018-07-31 DIAGNOSIS — R2689 Other abnormalities of gait and mobility: Secondary | ICD-10-CM | POA: Diagnosis not present

## 2018-07-31 DIAGNOSIS — S3289XA Fracture of other parts of pelvis, initial encounter for closed fracture: Secondary | ICD-10-CM | POA: Diagnosis not present

## 2018-07-31 DIAGNOSIS — S3282XS Multiple fractures of pelvis without disruption of pelvic ring, sequela: Secondary | ICD-10-CM | POA: Diagnosis not present

## 2018-08-01 DIAGNOSIS — R2689 Other abnormalities of gait and mobility: Secondary | ICD-10-CM | POA: Diagnosis not present

## 2018-08-01 DIAGNOSIS — Z9181 History of falling: Secondary | ICD-10-CM | POA: Diagnosis not present

## 2018-08-01 DIAGNOSIS — M25551 Pain in right hip: Secondary | ICD-10-CM | POA: Diagnosis not present

## 2018-08-01 DIAGNOSIS — S32509D Unspecified fracture of unspecified pubis, subsequent encounter for fracture with routine healing: Secondary | ICD-10-CM | POA: Diagnosis not present

## 2018-08-01 DIAGNOSIS — S3282XS Multiple fractures of pelvis without disruption of pelvic ring, sequela: Secondary | ICD-10-CM | POA: Diagnosis not present

## 2018-08-01 DIAGNOSIS — M6281 Muscle weakness (generalized): Secondary | ICD-10-CM | POA: Diagnosis not present

## 2018-08-03 DIAGNOSIS — M25551 Pain in right hip: Secondary | ICD-10-CM | POA: Diagnosis not present

## 2018-08-03 DIAGNOSIS — S32509D Unspecified fracture of unspecified pubis, subsequent encounter for fracture with routine healing: Secondary | ICD-10-CM | POA: Diagnosis not present

## 2018-08-03 DIAGNOSIS — R2689 Other abnormalities of gait and mobility: Secondary | ICD-10-CM | POA: Diagnosis not present

## 2018-08-03 DIAGNOSIS — S3282XS Multiple fractures of pelvis without disruption of pelvic ring, sequela: Secondary | ICD-10-CM | POA: Diagnosis not present

## 2018-08-03 DIAGNOSIS — M6281 Muscle weakness (generalized): Secondary | ICD-10-CM | POA: Diagnosis not present

## 2018-08-03 DIAGNOSIS — Z9181 History of falling: Secondary | ICD-10-CM | POA: Diagnosis not present

## 2018-08-04 DIAGNOSIS — R2689 Other abnormalities of gait and mobility: Secondary | ICD-10-CM | POA: Diagnosis not present

## 2018-08-04 DIAGNOSIS — S3282XS Multiple fractures of pelvis without disruption of pelvic ring, sequela: Secondary | ICD-10-CM | POA: Diagnosis not present

## 2018-08-04 DIAGNOSIS — M6281 Muscle weakness (generalized): Secondary | ICD-10-CM | POA: Diagnosis not present

## 2018-08-04 DIAGNOSIS — S32509D Unspecified fracture of unspecified pubis, subsequent encounter for fracture with routine healing: Secondary | ICD-10-CM | POA: Diagnosis not present

## 2018-08-04 DIAGNOSIS — M25551 Pain in right hip: Secondary | ICD-10-CM | POA: Diagnosis not present

## 2018-08-04 DIAGNOSIS — Z9181 History of falling: Secondary | ICD-10-CM | POA: Diagnosis not present

## 2018-08-05 DIAGNOSIS — R2689 Other abnormalities of gait and mobility: Secondary | ICD-10-CM | POA: Diagnosis not present

## 2018-08-05 DIAGNOSIS — S3282XS Multiple fractures of pelvis without disruption of pelvic ring, sequela: Secondary | ICD-10-CM | POA: Diagnosis not present

## 2018-08-05 DIAGNOSIS — S32509D Unspecified fracture of unspecified pubis, subsequent encounter for fracture with routine healing: Secondary | ICD-10-CM | POA: Diagnosis not present

## 2018-08-05 DIAGNOSIS — M6281 Muscle weakness (generalized): Secondary | ICD-10-CM | POA: Diagnosis not present

## 2018-08-05 DIAGNOSIS — Z9181 History of falling: Secondary | ICD-10-CM | POA: Diagnosis not present

## 2018-08-05 DIAGNOSIS — M25551 Pain in right hip: Secondary | ICD-10-CM | POA: Diagnosis not present

## 2018-08-06 DIAGNOSIS — S32509D Unspecified fracture of unspecified pubis, subsequent encounter for fracture with routine healing: Secondary | ICD-10-CM | POA: Diagnosis not present

## 2018-08-06 DIAGNOSIS — S3282XS Multiple fractures of pelvis without disruption of pelvic ring, sequela: Secondary | ICD-10-CM | POA: Diagnosis not present

## 2018-08-06 DIAGNOSIS — M6281 Muscle weakness (generalized): Secondary | ICD-10-CM | POA: Diagnosis not present

## 2018-08-06 DIAGNOSIS — M25551 Pain in right hip: Secondary | ICD-10-CM | POA: Diagnosis not present

## 2018-08-06 DIAGNOSIS — R2689 Other abnormalities of gait and mobility: Secondary | ICD-10-CM | POA: Diagnosis not present

## 2018-08-06 DIAGNOSIS — Z9181 History of falling: Secondary | ICD-10-CM | POA: Diagnosis not present

## 2018-08-07 ENCOUNTER — Inpatient Hospital Stay (INDEPENDENT_AMBULATORY_CARE_PROVIDER_SITE_OTHER): Payer: Medicare Other | Admitting: Physician Assistant

## 2018-08-07 DIAGNOSIS — M25551 Pain in right hip: Secondary | ICD-10-CM | POA: Diagnosis not present

## 2018-08-07 DIAGNOSIS — R2689 Other abnormalities of gait and mobility: Secondary | ICD-10-CM | POA: Diagnosis not present

## 2018-08-07 DIAGNOSIS — S32509D Unspecified fracture of unspecified pubis, subsequent encounter for fracture with routine healing: Secondary | ICD-10-CM | POA: Diagnosis not present

## 2018-08-07 DIAGNOSIS — Z9181 History of falling: Secondary | ICD-10-CM | POA: Diagnosis not present

## 2018-08-07 DIAGNOSIS — M6281 Muscle weakness (generalized): Secondary | ICD-10-CM | POA: Diagnosis not present

## 2018-08-07 DIAGNOSIS — S3282XS Multiple fractures of pelvis without disruption of pelvic ring, sequela: Secondary | ICD-10-CM | POA: Diagnosis not present

## 2018-08-08 DIAGNOSIS — S3282XS Multiple fractures of pelvis without disruption of pelvic ring, sequela: Secondary | ICD-10-CM | POA: Diagnosis not present

## 2018-08-08 DIAGNOSIS — Z9181 History of falling: Secondary | ICD-10-CM | POA: Diagnosis not present

## 2018-08-08 DIAGNOSIS — R2689 Other abnormalities of gait and mobility: Secondary | ICD-10-CM | POA: Diagnosis not present

## 2018-08-08 DIAGNOSIS — M25551 Pain in right hip: Secondary | ICD-10-CM | POA: Diagnosis not present

## 2018-08-08 DIAGNOSIS — M6281 Muscle weakness (generalized): Secondary | ICD-10-CM | POA: Diagnosis not present

## 2018-08-08 DIAGNOSIS — S32509D Unspecified fracture of unspecified pubis, subsequent encounter for fracture with routine healing: Secondary | ICD-10-CM | POA: Diagnosis not present

## 2018-08-11 ENCOUNTER — Inpatient Hospital Stay (INDEPENDENT_AMBULATORY_CARE_PROVIDER_SITE_OTHER): Payer: Medicare Other | Admitting: Physician Assistant

## 2018-08-11 ENCOUNTER — Encounter (INDEPENDENT_AMBULATORY_CARE_PROVIDER_SITE_OTHER): Payer: Self-pay | Admitting: Orthopaedic Surgery

## 2018-08-11 ENCOUNTER — Ambulatory Visit (INDEPENDENT_AMBULATORY_CARE_PROVIDER_SITE_OTHER): Payer: Medicare Other

## 2018-08-11 ENCOUNTER — Ambulatory Visit (INDEPENDENT_AMBULATORY_CARE_PROVIDER_SITE_OTHER): Payer: Medicare Other | Admitting: Orthopaedic Surgery

## 2018-08-11 DIAGNOSIS — M6281 Muscle weakness (generalized): Secondary | ICD-10-CM | POA: Diagnosis not present

## 2018-08-11 DIAGNOSIS — S32591A Other specified fracture of right pubis, initial encounter for closed fracture: Secondary | ICD-10-CM

## 2018-08-11 DIAGNOSIS — S32509D Unspecified fracture of unspecified pubis, subsequent encounter for fracture with routine healing: Secondary | ICD-10-CM | POA: Diagnosis not present

## 2018-08-11 DIAGNOSIS — S3282XS Multiple fractures of pelvis without disruption of pelvic ring, sequela: Secondary | ICD-10-CM | POA: Diagnosis not present

## 2018-08-11 DIAGNOSIS — R2689 Other abnormalities of gait and mobility: Secondary | ICD-10-CM | POA: Diagnosis not present

## 2018-08-11 DIAGNOSIS — Z9181 History of falling: Secondary | ICD-10-CM | POA: Diagnosis not present

## 2018-08-11 DIAGNOSIS — M25551 Pain in right hip: Secondary | ICD-10-CM | POA: Diagnosis not present

## 2018-08-11 NOTE — Progress Notes (Signed)
Office Visit Note   Patient: [name]           Date of Birth: Jun 12, 1941           MRN: [id number] Visit Date: 08/11/2018              Requested by: Darreld Mclean, MD [address] [place], [place] [address] PCP: Darreld Mclean, MD   Assessment & Plan: Visit Diagnoses:  1. Closed fracture of multiple rami of right pubis, initial encounter ([place])     Plan: I gave her reassurance that she should do well with time but it has been to take time for these fractures to heal.  She can continue to increase her activities and mobility as comfort allows with weightbearing as tolerated.  We will see her back in 4 weeks I would like just an AP pelvis at that visit.  Follow-Up Instructions: Return in about 4 weeks (around 09/08/2018).   Orders:  Orders Placed This Encounter  Procedures  . XR Pelvis 1-2 Views   No orders of the defined types were placed in this encounter.     Procedures: No procedures performed   Clinical Data: No additional findings.   Subjective: Chief Complaint  Patient presents with  . Pelvis - Follow-up  The patient is about 3 weeks into mechanical fall in which she sustained a nondisplaced right-sided pubic rami fracture.  She has a history of bilateral hip replacements.  She is staying in the nursing facility to work on her balance, coordination and mobility.  She still having considerable amount of pubic pain on the right side.  She denies any change in bowel bladder function.  She has been able to weight-bear as tolerated.  Her daughter is with her today.  She is an active 78 years old.  She is independent prior to this.  HPI  Review of Systems She currently denies any headache, chest pain, shortness of breath, fever, chills, nausea, vomiting  Objective: Vital Signs: There were no vitals taken for this visit.  Physical Exam She is alert and orient x3 and in no acute distress Ortho Exam Examination of both hips show the  move fluidly with no pain on compression of either hip.  She has some pain to palpation over the pubis to the right side. Specialty Comments:  No specialty comments available.  Imaging: Xr Pelvis 1-2 Views  Result Date: 08/11/2018 An AP pelvis shows nondisplaced right-sided pubic rami fracture.  This is not changed from previous x-rays and remains nondisplaced.    PMFS History: Patient Active Problem List   Diagnosis Date Noted  . Closed fracture of multiple pubic rami ([place]) 07/22/2018  . Closed fracture of right superior rim of pubis ([place])   . Lateral dislocation of left patella 01/15/2017  . Unilateral primary osteoarthritis, right knee 01/15/2017  . Osteoporosis 11/26/2016  . Acute pain of left knee 08/27/2016  . Chronic diarrhea 08/07/2016  . Left displaced femoral neck fracture ([place]) 06/26/2016  . MRSA (methicillin resistant Staphylococcus aureus) infection 06/26/2016  . Hypertensive heart disease   . Hydronephrosis   . Obstructive uropathy   . Abnormal echocardiogram 01/23/2016  . Pressure ulcer 01/21/2016  . Chronic infection of prosthetic knee ([place]) 10/12/2015  . AAA 01/19/2009  . ILIAC ARTERY ANEURYSM 01/19/2009  . Hyperlipidemia 01/14/2009  . TOBACCO ABUSE 01/14/2009  . Essential hypertension 01/14/2009  . Coronary atherosclerosis 01/14/2009  . Cerebrovascular disease 01/14/2009  . Peripheral vascular disease ([place])  01/14/2009  . CHRONIC OBSTRUCTIVE PULMONARY DISEASE 01/14/2009   Past Medical History:  Diagnosis Date  . Arthritis   . CAD (coronary artery disease)    a. 02/2007 s/p PCI/DES ot LCX and RCA;  b. 07/2014 MV: no ischemia/infarct, EF 68%.  . Carotid arterial disease ([place])    a. 05/2015 Carotid U/S: RICA 0-94%, LICA 70-96%, RECA 2-83%, LECA >50% - overall stable, f/u 1 yr.  . Complication of anesthesia    Three days after procedure pt statrted to suffer with memoy loss that took about 4-5 days to come back according to [name]."  . Dilated  cardiomyopathy ([place])    a. 05/2005 Echo: EF 65%; b. 01/22/2016 Echo: EF 25-30% (in setting of admission for urosepsis w/ elev trop); c. 01/26/2016 Echo: EF 35-40%, inf, distal apical, apical, and mid to distal septal HK-->felt to be 2/2 stress induced CM.  [name] Heart murmur   . History of bronchitis   . Hyperlipidemia   . Hypertension   . Hypertensive heart disease   . Memory impairment   . MRSA (methicillin resistant staph aureus) culture positive   . Nephrolithiasis    a. 12/2015 UVJ stone w/ hydroureteronephrosis req nephrostomy tube placement.  . Osteoarthritis    a. 12/6292 s/p L TKA complicated by infections req skin grafting.  [name] PVD (peripheral vascular disease) ([place])    a. Bilat common iliac dzs, nl ABI's --> med rx.  . Sepsis ([place])    a. 12/2015 admitted with Proteus mirabilis urosepsis/bacteremia.  . Wears glasses     Family History  Problem Relation Age of Onset  . Breast cancer Mother   . Leukemia Father   . Stroke Sister   . Bone cancer Other     Past Surgical History:  Procedure Laterality Date  . ABDOMINAL HYSTERECTOMY    . APPLICATION OF A-CELL OF EXTREMITY Left 12/08/2015   Procedure: APPLICATION OF A-CELL OF knee;  Surgeon: Loel Lofty [name], DO;  Location: [facility];  Service: Plastics;  Laterality: Left;  . APPLICATION OF WOUND VAC Left 11/16/2015   Procedure: APPLICATION OF WOUND VAC;  Surgeon: Loel Lofty [name], DO;  Location: [facility];  Service: Plastics;  Laterality: Left;  . APPLICATION OF WOUND VAC Left 12/08/2015   Procedure: APPLICATION OF WOUND VAC  AND CAST to knee;  Surgeon: Loel Lofty [name], DO;  Location: [facility];  Service: Plastics;  Laterality: Left;  . COLONOSCOPY    . CORONARY ANGIOPLASTY  07   3 stents placed  . ESOPHAGOGASTRODUODENOSCOPY    . EYE SURGERY Bilateral    cataracts  . FREE FLAP TO EXTREMITY Left 11/16/2015   Procedure: FREE FLAP TO EXTREMITY;  Surgeon: Loel Lofty [name], DO;  Location: [facility];  Service: Plastics;  Laterality: Left;  .  I&D EXTREMITY Left 10/18/2015   Procedure: Left Knee Washout, Reclosure;  Surgeon: Newt Minion, MD;  Location: [facility];  Service: Orthopedics;  Laterality: Left;  . I&D EXTREMITY Left 12/08/2015   Procedure: IRRIGATION AND DEBRIDEMENT knee;  Surgeon: Loel Lofty [name], DO;  Location: [facility];  Service: Plastics;  Laterality: Left;  . I&D KNEE WITH POLY EXCHANGE Left 10/12/2015   Procedure: Irrigation and Debridement , Poly Exchange, Antibiotic Beads Left Knee;  Surgeon: Newt Minion, MD;  Location: [facility];  Service: Orthopedics;  Laterality: Left;  . INCISION AND DRAINAGE OF WOUND Left 01/04/2016   Procedure: DEBRIDEMENT OF LEFT KNEE WOUND WITH ACELL AND WOUND VAC PLACEMENT;  Surgeon: Loel Lofty [name],  DO;  Location: [facility];  Service: Plastics;  Laterality: Left;  . IR GENERIC HISTORICAL  05/11/2016   IR FLUORO GUIDE CV MIDLINE PICC RIGHT 05/11/2016 Sandi Mariscal, MD [facility]  . IR GENERIC HISTORICAL  05/11/2016   IR US GUIDE VASC ACCESS RIGHT 05/11/2016 Sandi Mariscal, MD [facility]  . KNEE CLOSED REDUCTION Left 01/16/2017   Procedure: CLOSED MANIPULATION LEFT KNEE;  Surgeon: Leandrew Koyanagi, MD;  Location: [facility];  Service: Orthopedics;  Laterality: Left;  . KNEE SURGERY     denies  . SKIN SPLIT GRAFT Left 11/16/2015   Procedure: LEFT GASTROC MUSCLE FLAP WITH SKIN GRAFT SPLIT THICKNESS AND VAC PLACEMENT;  Surgeon: Loel Lofty [name], DO;  Location: [facility];  Service: Plastics;  Laterality: Left;  . TOTAL HIP ARTHROPLASTY Right 09/10/2012   Dr Sharol Given  . TOTAL HIP ARTHROPLASTY Right 09/10/2012   Procedure: TOTAL HIP ARTHROPLASTY;  Surgeon: Newt Minion, MD;  Location: [facility];  Service: Orthopedics;  Laterality: Right;  Right Total Hip Arthroplasty  . TOTAL HIP ARTHROPLASTY Left 06/27/2016   Procedure: LEFT TOTAL HIP ARTHROPLASTY ANTERIOR APPROACH;  Surgeon: Leandrew Koyanagi, MD;  Location: [facility];  Service: Orthopedics;  Laterality: Left;  . TOTAL KNEE ARTHROPLASTY Left 09/28/2015    Procedure: TOTAL KNEE ARTHROPLASTY;  Surgeon: Newt Minion, MD;  Location: [facility];  Service: Orthopedics;  Laterality: Left;   Social History   Occupational History  . Occupation: Retired  Tobacco Use  . Smoking status: Former Smoker    Packs/day: 0.50    Years: 50.00    Pack years: 25.00    Types: Cigarettes    Last attempt to quit: 09/28/2015    Years since quitting: 2.8  . Smokeless tobacco: Never Used  . Tobacco comment: quit 09/27/15  Substance and Sexual Activity  . Alcohol use: No  . Drug use: No  . Sexual activity: Never

## 2018-08-12 DIAGNOSIS — M25551 Pain in right hip: Secondary | ICD-10-CM | POA: Diagnosis not present

## 2018-08-12 DIAGNOSIS — M6281 Muscle weakness (generalized): Secondary | ICD-10-CM | POA: Diagnosis not present

## 2018-08-12 DIAGNOSIS — R2689 Other abnormalities of gait and mobility: Secondary | ICD-10-CM | POA: Diagnosis not present

## 2018-08-12 DIAGNOSIS — S32509D Unspecified fracture of unspecified pubis, subsequent encounter for fracture with routine healing: Secondary | ICD-10-CM | POA: Diagnosis not present

## 2018-08-12 DIAGNOSIS — Z9181 History of falling: Secondary | ICD-10-CM | POA: Diagnosis not present

## 2018-08-12 DIAGNOSIS — S3282XS Multiple fractures of pelvis without disruption of pelvic ring, sequela: Secondary | ICD-10-CM | POA: Diagnosis not present

## 2018-08-13 DIAGNOSIS — M6281 Muscle weakness (generalized): Secondary | ICD-10-CM | POA: Diagnosis not present

## 2018-08-13 DIAGNOSIS — S32509D Unspecified fracture of unspecified pubis, subsequent encounter for fracture with routine healing: Secondary | ICD-10-CM | POA: Diagnosis not present

## 2018-08-13 DIAGNOSIS — Z9181 History of falling: Secondary | ICD-10-CM | POA: Diagnosis not present

## 2018-08-13 DIAGNOSIS — M25551 Pain in right hip: Secondary | ICD-10-CM | POA: Diagnosis not present

## 2018-08-13 DIAGNOSIS — S3282XS Multiple fractures of pelvis without disruption of pelvic ring, sequela: Secondary | ICD-10-CM | POA: Diagnosis not present

## 2018-08-13 DIAGNOSIS — R2689 Other abnormalities of gait and mobility: Secondary | ICD-10-CM | POA: Diagnosis not present

## 2018-08-14 DIAGNOSIS — R2689 Other abnormalities of gait and mobility: Secondary | ICD-10-CM | POA: Diagnosis not present

## 2018-08-14 DIAGNOSIS — S32509D Unspecified fracture of unspecified pubis, subsequent encounter for fracture with routine healing: Secondary | ICD-10-CM | POA: Diagnosis not present

## 2018-08-14 DIAGNOSIS — S3282XS Multiple fractures of pelvis without disruption of pelvic ring, sequela: Secondary | ICD-10-CM | POA: Diagnosis not present

## 2018-08-14 DIAGNOSIS — M6281 Muscle weakness (generalized): Secondary | ICD-10-CM | POA: Diagnosis not present

## 2018-08-14 DIAGNOSIS — M25551 Pain in right hip: Secondary | ICD-10-CM | POA: Diagnosis not present

## 2018-08-14 DIAGNOSIS — Z9181 History of falling: Secondary | ICD-10-CM | POA: Diagnosis not present

## 2018-08-15 DIAGNOSIS — S3282XS Multiple fractures of pelvis without disruption of pelvic ring, sequela: Secondary | ICD-10-CM | POA: Diagnosis not present

## 2018-08-15 DIAGNOSIS — R2689 Other abnormalities of gait and mobility: Secondary | ICD-10-CM | POA: Diagnosis not present

## 2018-08-15 DIAGNOSIS — M25551 Pain in right hip: Secondary | ICD-10-CM | POA: Diagnosis not present

## 2018-08-15 DIAGNOSIS — M6281 Muscle weakness (generalized): Secondary | ICD-10-CM | POA: Diagnosis not present

## 2018-08-15 DIAGNOSIS — S32509D Unspecified fracture of unspecified pubis, subsequent encounter for fracture with routine healing: Secondary | ICD-10-CM | POA: Diagnosis not present

## 2018-08-15 DIAGNOSIS — Z9181 History of falling: Secondary | ICD-10-CM | POA: Diagnosis not present

## 2018-08-16 DIAGNOSIS — S32509D Unspecified fracture of unspecified pubis, subsequent encounter for fracture with routine healing: Secondary | ICD-10-CM | POA: Diagnosis not present

## 2018-08-16 DIAGNOSIS — M6281 Muscle weakness (generalized): Secondary | ICD-10-CM | POA: Diagnosis not present

## 2018-08-16 DIAGNOSIS — Z9181 History of falling: Secondary | ICD-10-CM | POA: Diagnosis not present

## 2018-08-16 DIAGNOSIS — M25551 Pain in right hip: Secondary | ICD-10-CM | POA: Diagnosis not present

## 2018-08-16 DIAGNOSIS — R2689 Other abnormalities of gait and mobility: Secondary | ICD-10-CM | POA: Diagnosis not present

## 2018-08-16 DIAGNOSIS — S3282XS Multiple fractures of pelvis without disruption of pelvic ring, sequela: Secondary | ICD-10-CM | POA: Diagnosis not present

## 2018-08-18 DIAGNOSIS — M6281 Muscle weakness (generalized): Secondary | ICD-10-CM | POA: Diagnosis not present

## 2018-08-18 DIAGNOSIS — S32509D Unspecified fracture of unspecified pubis, subsequent encounter for fracture with routine healing: Secondary | ICD-10-CM | POA: Diagnosis not present

## 2018-08-18 DIAGNOSIS — R2689 Other abnormalities of gait and mobility: Secondary | ICD-10-CM | POA: Diagnosis not present

## 2018-08-18 DIAGNOSIS — S3282XS Multiple fractures of pelvis without disruption of pelvic ring, sequela: Secondary | ICD-10-CM | POA: Diagnosis not present

## 2018-08-18 DIAGNOSIS — M25551 Pain in right hip: Secondary | ICD-10-CM | POA: Diagnosis not present

## 2018-08-18 DIAGNOSIS — Z9181 History of falling: Secondary | ICD-10-CM | POA: Diagnosis not present

## 2018-08-19 DIAGNOSIS — M6281 Muscle weakness (generalized): Secondary | ICD-10-CM | POA: Diagnosis not present

## 2018-08-19 DIAGNOSIS — Z9181 History of falling: Secondary | ICD-10-CM | POA: Diagnosis not present

## 2018-08-19 DIAGNOSIS — R2689 Other abnormalities of gait and mobility: Secondary | ICD-10-CM | POA: Diagnosis not present

## 2018-08-19 DIAGNOSIS — M25551 Pain in right hip: Secondary | ICD-10-CM | POA: Diagnosis not present

## 2018-08-19 DIAGNOSIS — S3282XS Multiple fractures of pelvis without disruption of pelvic ring, sequela: Secondary | ICD-10-CM | POA: Diagnosis not present

## 2018-08-19 DIAGNOSIS — S32509D Unspecified fracture of unspecified pubis, subsequent encounter for fracture with routine healing: Secondary | ICD-10-CM | POA: Diagnosis not present

## 2018-08-21 DIAGNOSIS — S32511D Fracture of superior rim of right pubis, subsequent encounter for fracture with routine healing: Secondary | ICD-10-CM | POA: Diagnosis not present

## 2018-08-21 DIAGNOSIS — A4902 Methicillin resistant Staphylococcus aureus infection, unspecified site: Secondary | ICD-10-CM | POA: Diagnosis not present

## 2018-08-21 DIAGNOSIS — I10 Essential (primary) hypertension: Secondary | ICD-10-CM | POA: Diagnosis not present

## 2018-08-21 DIAGNOSIS — S32509D Unspecified fracture of unspecified pubis, subsequent encounter for fracture with routine healing: Secondary | ICD-10-CM | POA: Diagnosis not present

## 2018-08-21 DIAGNOSIS — Z9181 History of falling: Secondary | ICD-10-CM | POA: Diagnosis not present

## 2018-08-21 DIAGNOSIS — E785 Hyperlipidemia, unspecified: Secondary | ICD-10-CM | POA: Diagnosis not present

## 2018-08-21 DIAGNOSIS — S32810D Multiple fractures of pelvis with stable disruption of pelvic ring, subsequent encounter for fracture with routine healing: Secondary | ICD-10-CM | POA: Diagnosis not present

## 2018-08-21 DIAGNOSIS — J449 Chronic obstructive pulmonary disease, unspecified: Secondary | ICD-10-CM | POA: Diagnosis not present

## 2018-08-21 DIAGNOSIS — I2510 Atherosclerotic heart disease of native coronary artery without angina pectoris: Secondary | ICD-10-CM | POA: Diagnosis not present

## 2018-08-22 ENCOUNTER — Telehealth (INDEPENDENT_AMBULATORY_CARE_PROVIDER_SITE_OTHER): Payer: Self-pay | Admitting: Orthopaedic Surgery

## 2018-08-22 NOTE — Telephone Encounter (Signed)
Verbal orders left on VM 

## 2018-08-22 NOTE — Telephone Encounter (Signed)
[facility]  [phone]  [place]    Verbal orders  HHPT   2 times a wk for 5 wk

## 2018-08-27 ENCOUNTER — Other Ambulatory Visit: Payer: Self-pay

## 2018-08-27 DIAGNOSIS — S32511D Fracture of superior rim of right pubis, subsequent encounter for fracture with routine healing: Secondary | ICD-10-CM | POA: Diagnosis not present

## 2018-08-27 DIAGNOSIS — J449 Chronic obstructive pulmonary disease, unspecified: Secondary | ICD-10-CM | POA: Diagnosis not present

## 2018-08-27 DIAGNOSIS — I2510 Atherosclerotic heart disease of native coronary artery without angina pectoris: Secondary | ICD-10-CM | POA: Diagnosis not present

## 2018-08-27 DIAGNOSIS — I10 Essential (primary) hypertension: Secondary | ICD-10-CM | POA: Diagnosis not present

## 2018-08-27 DIAGNOSIS — A4902 Methicillin resistant Staphylococcus aureus infection, unspecified site: Secondary | ICD-10-CM | POA: Diagnosis not present

## 2018-08-27 DIAGNOSIS — S32810D Multiple fractures of pelvis with stable disruption of pelvic ring, subsequent encounter for fracture with routine healing: Secondary | ICD-10-CM | POA: Diagnosis not present

## 2018-08-27 NOTE — Patient Outreach (Signed)
[facility]) Care Management  08/27/2018  [name] 02-05-1941 [id number]   EMMI- General Discharge RED ON EMMI ALERT Day # 4 Date: 08/26/2018 Red Alert Reason:  Scheduled follow-up? No  Other questions/problems? Yes    Outreach attempt: Spoke with patient.  She asked if CM could callback. I advised patient that I would call back tomorrow.  She verbalized understanding.   Plan: RN CM will attempt again within 4 business days and send letter.  Jone Baseman, RN, MSN [facility] Care Management Care Management Coordinator Direct Line [phone] Toll Free: [phone]  Fax: [phone]

## 2018-08-28 ENCOUNTER — Other Ambulatory Visit: Payer: Self-pay

## 2018-08-28 DIAGNOSIS — S32511D Fracture of superior rim of right pubis, subsequent encounter for fracture with routine healing: Secondary | ICD-10-CM | POA: Diagnosis not present

## 2018-08-28 DIAGNOSIS — J449 Chronic obstructive pulmonary disease, unspecified: Secondary | ICD-10-CM | POA: Diagnosis not present

## 2018-08-28 DIAGNOSIS — S32810D Multiple fractures of pelvis with stable disruption of pelvic ring, subsequent encounter for fracture with routine healing: Secondary | ICD-10-CM | POA: Diagnosis not present

## 2018-08-28 DIAGNOSIS — I10 Essential (primary) hypertension: Secondary | ICD-10-CM | POA: Diagnosis not present

## 2018-08-28 DIAGNOSIS — I2510 Atherosclerotic heart disease of native coronary artery without angina pectoris: Secondary | ICD-10-CM | POA: Diagnosis not present

## 2018-08-28 DIAGNOSIS — A4902 Methicillin resistant Staphylococcus aureus infection, unspecified site: Secondary | ICD-10-CM | POA: Diagnosis not present

## 2018-08-28 NOTE — Patient Outreach (Addendum)
[facility]) Care Management  08/28/2018  [name] 03/30/1941 [id number]   EMMI- General Discharge RED ON EMMI ALERT Day # 4 Date:08/26/2018 Red Alert Reason: Scheduled follow-up? No  Other questions/problems? Yes    Outreach attempt: Spoke with patient.  She is able to verify HIPAA.  Advised patient on red alerts.  Patient states she has not made any follow up appointments.  Offered to assist patient with making follow up appointments.  She declined and states she has someone that will help her in making any follow up appointments.  Patient denies any other problems, questions or needs.    Plan: RN CM will close case.   Jone Baseman, RN, MSN [place] Management Care Management Coordinator Direct Line [phone] Cell [phone] Toll Free: [phone]  Fax: [phone]

## 2018-08-29 DIAGNOSIS — I2510 Atherosclerotic heart disease of native coronary artery without angina pectoris: Secondary | ICD-10-CM | POA: Diagnosis not present

## 2018-08-29 DIAGNOSIS — S32511D Fracture of superior rim of right pubis, subsequent encounter for fracture with routine healing: Secondary | ICD-10-CM | POA: Diagnosis not present

## 2018-08-29 DIAGNOSIS — S32810D Multiple fractures of pelvis with stable disruption of pelvic ring, subsequent encounter for fracture with routine healing: Secondary | ICD-10-CM | POA: Diagnosis not present

## 2018-08-29 DIAGNOSIS — J449 Chronic obstructive pulmonary disease, unspecified: Secondary | ICD-10-CM | POA: Diagnosis not present

## 2018-08-29 DIAGNOSIS — A4902 Methicillin resistant Staphylococcus aureus infection, unspecified site: Secondary | ICD-10-CM | POA: Diagnosis not present

## 2018-08-29 DIAGNOSIS — I10 Essential (primary) hypertension: Secondary | ICD-10-CM | POA: Diagnosis not present

## 2018-09-02 ENCOUNTER — Telehealth (INDEPENDENT_AMBULATORY_CARE_PROVIDER_SITE_OTHER): Payer: Self-pay | Admitting: Orthopaedic Surgery

## 2018-09-02 DIAGNOSIS — I2510 Atherosclerotic heart disease of native coronary artery without angina pectoris: Secondary | ICD-10-CM | POA: Diagnosis not present

## 2018-09-02 DIAGNOSIS — A4902 Methicillin resistant Staphylococcus aureus infection, unspecified site: Secondary | ICD-10-CM | POA: Diagnosis not present

## 2018-09-02 DIAGNOSIS — J449 Chronic obstructive pulmonary disease, unspecified: Secondary | ICD-10-CM | POA: Diagnosis not present

## 2018-09-02 DIAGNOSIS — S32511D Fracture of superior rim of right pubis, subsequent encounter for fracture with routine healing: Secondary | ICD-10-CM | POA: Diagnosis not present

## 2018-09-02 DIAGNOSIS — I10 Essential (primary) hypertension: Secondary | ICD-10-CM | POA: Diagnosis not present

## 2018-09-02 DIAGNOSIS — S32810D Multiple fractures of pelvis with stable disruption of pelvic ring, subsequent encounter for fracture with routine healing: Secondary | ICD-10-CM | POA: Diagnosis not present

## 2018-09-02 NOTE — Telephone Encounter (Signed)
Patient called asked if she can get a cortisone injection in her right knee 09/08/2018 when she come for her visit. The number to contact patient is [phone]

## 2018-09-02 NOTE — Telephone Encounter (Signed)
Added right knee cortisone injection for 09/08/2018

## 2018-09-02 NOTE — Telephone Encounter (Signed)
Sure

## 2018-09-04 DIAGNOSIS — I2510 Atherosclerotic heart disease of native coronary artery without angina pectoris: Secondary | ICD-10-CM | POA: Diagnosis not present

## 2018-09-04 DIAGNOSIS — I10 Essential (primary) hypertension: Secondary | ICD-10-CM | POA: Diagnosis not present

## 2018-09-04 DIAGNOSIS — S32511D Fracture of superior rim of right pubis, subsequent encounter for fracture with routine healing: Secondary | ICD-10-CM | POA: Diagnosis not present

## 2018-09-04 DIAGNOSIS — S32810D Multiple fractures of pelvis with stable disruption of pelvic ring, subsequent encounter for fracture with routine healing: Secondary | ICD-10-CM | POA: Diagnosis not present

## 2018-09-04 DIAGNOSIS — J449 Chronic obstructive pulmonary disease, unspecified: Secondary | ICD-10-CM | POA: Diagnosis not present

## 2018-09-04 DIAGNOSIS — A4902 Methicillin resistant Staphylococcus aureus infection, unspecified site: Secondary | ICD-10-CM | POA: Diagnosis not present

## 2018-09-05 DIAGNOSIS — S32511D Fracture of superior rim of right pubis, subsequent encounter for fracture with routine healing: Secondary | ICD-10-CM | POA: Diagnosis not present

## 2018-09-05 DIAGNOSIS — J449 Chronic obstructive pulmonary disease, unspecified: Secondary | ICD-10-CM | POA: Diagnosis not present

## 2018-09-05 DIAGNOSIS — S32810D Multiple fractures of pelvis with stable disruption of pelvic ring, subsequent encounter for fracture with routine healing: Secondary | ICD-10-CM | POA: Diagnosis not present

## 2018-09-05 DIAGNOSIS — I10 Essential (primary) hypertension: Secondary | ICD-10-CM | POA: Diagnosis not present

## 2018-09-05 DIAGNOSIS — I2510 Atherosclerotic heart disease of native coronary artery without angina pectoris: Secondary | ICD-10-CM | POA: Diagnosis not present

## 2018-09-05 DIAGNOSIS — A4902 Methicillin resistant Staphylococcus aureus infection, unspecified site: Secondary | ICD-10-CM | POA: Diagnosis not present

## 2018-09-08 ENCOUNTER — Ambulatory Visit (INDEPENDENT_AMBULATORY_CARE_PROVIDER_SITE_OTHER): Payer: Self-pay

## 2018-09-08 ENCOUNTER — Encounter (INDEPENDENT_AMBULATORY_CARE_PROVIDER_SITE_OTHER): Payer: Self-pay | Admitting: Orthopaedic Surgery

## 2018-09-08 ENCOUNTER — Ambulatory Visit (INDEPENDENT_AMBULATORY_CARE_PROVIDER_SITE_OTHER): Payer: Medicare Other | Admitting: Orthopaedic Surgery

## 2018-09-08 DIAGNOSIS — S32591G Other specified fracture of right pubis, subsequent encounter for fracture with delayed healing: Secondary | ICD-10-CM | POA: Diagnosis not present

## 2018-09-08 DIAGNOSIS — M1711 Unilateral primary osteoarthritis, right knee: Secondary | ICD-10-CM | POA: Diagnosis not present

## 2018-09-08 DIAGNOSIS — Z792 Long term (current) use of antibiotics: Secondary | ICD-10-CM | POA: Diagnosis not present

## 2018-09-08 DIAGNOSIS — M25562 Pain in left knee: Secondary | ICD-10-CM

## 2018-09-08 DIAGNOSIS — Z96643 Presence of artificial hip joint, bilateral: Secondary | ICD-10-CM

## 2018-09-08 MED ORDER — METHYLPREDNISOLONE ACETATE 40 MG/ML IJ SUSP
40.0000 mg | INTRAMUSCULAR | Status: AC | PRN
  Administered 2018-09-08: 40 mg via INTRA_ARTICULAR

## 2018-09-08 MED ORDER — LIDOCAINE HCL 1 % IJ SOLN
3.0000 mL | INTRAMUSCULAR | Status: AC | PRN
  Administered 2018-09-08: 3 mL

## 2018-09-08 NOTE — Progress Notes (Signed)
Office Visit Note   Patient: [name]           Date of Birth: [birth year]           MRN: [id number] Visit Date: 09/08/2018              Requested by: Darreld Mclean, MD [address] [place], [place] [address] PCP: Darreld Mclean, MD   Assessment & Plan: Visit Diagnoses:  1. Closed fracture of multiple rami of right pubis with delayed healing, subsequent encounter   2. Left knee pain, unspecified chronicity   3. Unilateral primary osteoarthritis, right knee     Plan: I do feel that her pubic rami fracture is healed.  At this point follow-up can be as needed.  She can try some Bactroban ointment on her chronic left knee wound.  I did provide a steroid injection per her wishes in the right knee.  All question concerns were answered and addressed.  Follow-up at this point will be as needed.  Follow-Up Instructions: Return if symptoms worsen or fail to improve.   Orders:  Orders Placed This Encounter  Procedures  . XR Pelvis 1-2 Views  . XR Knee 1-2 Views Left   No orders of the defined types were placed in this encounter.     Procedures: Large Joint Inj: R knee on 09/08/2018 9:56 AM Indications: diagnostic evaluation and pain Details: 22 G 1.5 in needle, superolateral approach  Arthrogram: No  Medications: 3 mL lidocaine 1 %; 40 mg methylPREDNISolone acetate 40 MG/ML Outcome: tolerated well, no immediate complications Procedure, treatment alternatives, risks and benefits explained, specific risks discussed. Consent was given by the patient. Immediately prior to procedure a time out was called to verify the correct patient, procedure, equipment, support staff and site/side marked as required. Patient was prepped and draped in the usual sterile fashion.       Clinical Data: No additional findings.   Subjective: Chief Complaint  Patient presents with  . Right Knee - Follow-up  The patient is someone I am seeing in follow-up for a right  superior pubic rami fracture and pubis fracture.  She has a history of bilateral hip replacements with one done by my partner Dr. Sharol Given and 1 done by my partner Dr. Erlinda Hong.  She also has a history of a left total knee arthroplasty that was done by Dr.Duda .  That had a history also of infection and a chronically subluxed patella.  She also has known osteoarthritis and pain in her right knee.  She would like to have a right knee steroid injection today.  She says her pubis and pelvic bones and hips are doing well.  She is concerned about her left knee with her chronic subluxed patella in terms of the skin changes around her knee.  Therapy when she is recovering from her pubic rami fractures try to work on her knee although this was a chronic issue.  Due to chronic infection she is also on long-term suppressive antibiotics and is seen regularly by infectious disease.  HPI  Review of Systems She currently denies any fever, chills, nausea, vomiting.  She also denies any chest pain or shortness of breath.  Objective: Vital Signs: There were no vitals taken for this visit.  Physical Exam She is alert and orient x3 and in no acute distress Ortho Exam Examination of her pubis area she has no pain over the pubis or the hips at all.  Her  right knee has medial lateral joint line tenderness and significant patellofemoral crepitation.  The left knee shows a chronically subluxed patella component with bruising around the skin and chronic skin changes but no evidence of infection.  There is a small wound there with no evidence of infection. Specialty Comments:  No specialty comments available.  Imaging: Xr Knee 1-2 Views Left  Result Date: 09/08/2018 2 views of the left knee show a total knee arthroplasty with a chronically subluxed patella.  That has shown up on x-rays over the last 2 years and is unchanged.  The right knee shows severe osteoarthritis on the AP view.  Xr Pelvis 1-2 Views  Result Date:  09/08/2018 An AP pelvis shows a healing superior pubic rami fracture on the right side.  There is abundant callus formation compared to previous films.  There is no complicating feature otherwise.  There is a history of bilateral total hip arthroplasties that both appear to have no acute findings.    PMFS History: Patient Active Problem List   Diagnosis Date Noted  . Closed fracture of multiple pubic rami ([place]) 07/22/2018  . Closed fracture of right superior rim of pubis ([place])   . Lateral dislocation of left patella 01/15/2017  . Unilateral primary osteoarthritis, right knee 01/15/2017  . Osteoporosis 11/26/2016  . Acute pain of left knee 08/27/2016  . Chronic diarrhea 08/07/2016  . Left displaced femoral neck fracture ([place]) 06/26/2016  . MRSA (methicillin resistant Staphylococcus aureus) infection 06/26/2016  . Hypertensive heart disease   . Hydronephrosis   . Obstructive uropathy   . Abnormal echocardiogram 01/23/2016  . Pressure ulcer 01/21/2016  . Chronic infection of prosthetic knee ([place]) 10/12/2015  . AAA 01/19/2009  . ILIAC ARTERY ANEURYSM 01/19/2009  . Hyperlipidemia 01/14/2009  . TOBACCO ABUSE 01/14/2009  . Essential hypertension 01/14/2009  . Coronary atherosclerosis 01/14/2009  . Cerebrovascular disease 01/14/2009  . Peripheral vascular disease ([place]) 01/14/2009  . CHRONIC OBSTRUCTIVE PULMONARY DISEASE 01/14/2009   Past Medical History:  Diagnosis Date  . Arthritis   . CAD (coronary artery disease)    a. 02/2007 s/p PCI/DES ot LCX and RCA;  b. 07/2014 MV: no ischemia/infarct, EF 68%.  . Carotid arterial disease ([place])    a. 05/2015 Carotid U/S: RICA 0-17%, LICA 51-02%, RECA 5-85%, LECA >50% - overall stable, f/u 1 yr.  . Complication of anesthesia    Three days after procedure pt statrted to suffer with memoy loss that took about 4-5 days to come back according to [name]."  . Dilated cardiomyopathy ([place])    a. 05/2005 Echo: EF 65%; b. 01/22/2016 Echo: EF 25-30%  (in setting of admission for urosepsis w/ elev trop); c. 01/26/2016 Echo: EF 35-40%, inf, distal apical, apical, and mid to distal septal HK-->felt to be 2/2 stress induced CM.  [name] Heart murmur   . History of bronchitis   . Hyperlipidemia   . Hypertension   . Hypertensive heart disease   . Memory impairment   . MRSA (methicillin resistant staph aureus) culture positive   . Nephrolithiasis    a. 12/2015 UVJ stone w/ hydroureteronephrosis req nephrostomy tube placement.  . Osteoarthritis    a. 08/7780 s/p L TKA complicated by infections req skin grafting.  [name] PVD (peripheral vascular disease) ([place])    a. Bilat common iliac dzs, nl ABI's --> med rx.  . Sepsis ([place])    a. 12/2015 admitted with Proteus mirabilis urosepsis/bacteremia.  . Wears glasses     Family History  Problem  Relation Age of Onset  . Breast cancer Mother   . Leukemia Father   . Stroke Sister   . Bone cancer Other     Past Surgical History:  Procedure Laterality Date  . ABDOMINAL HYSTERECTOMY    . APPLICATION OF A-CELL OF EXTREMITY Left 12/08/2015   Procedure: APPLICATION OF A-CELL OF knee;  Surgeon: Loel Lofty [name], DO;  Location: [place];  Service: Plastics;  Laterality: Left;  . APPLICATION OF WOUND VAC Left 11/16/2015   Procedure: APPLICATION OF WOUND VAC;  Surgeon: Loel Lofty [name], DO;  Location: [facility];  Service: Plastics;  Laterality: Left;  . APPLICATION OF WOUND VAC Left 12/08/2015   Procedure: APPLICATION OF WOUND VAC  AND CAST to knee;  Surgeon: Loel Lofty [name], DO;  Location: [facility];  Service: Plastics;  Laterality: Left;  . COLONOSCOPY    . CORONARY ANGIOPLASTY  07   3 stents placed  . ESOPHAGOGASTRODUODENOSCOPY    . EYE SURGERY Bilateral    cataracts  . FREE FLAP TO EXTREMITY Left 11/16/2015   Procedure: FREE FLAP TO EXTREMITY;  Surgeon: Loel Lofty [name], DO;  Location: [facility];  Service: Plastics;  Laterality: Left;  . I&D EXTREMITY Left 10/18/2015   Procedure: Left Knee Washout, Reclosure;   Surgeon: Newt Minion, MD;  Location: [facility];  Service: Orthopedics;  Laterality: Left;  . I&D EXTREMITY Left 12/08/2015   Procedure: IRRIGATION AND DEBRIDEMENT knee;  Surgeon: Loel Lofty [name], DO;  Location: [facility];  Service: Plastics;  Laterality: Left;  . I&D KNEE WITH POLY EXCHANGE Left 10/12/2015   Procedure: Irrigation and Debridement , Poly Exchange, Antibiotic Beads Left Knee;  Surgeon: Newt Minion, MD;  Location: [facility];  Service: Orthopedics;  Laterality: Left;  . INCISION AND DRAINAGE OF WOUND Left 01/04/2016   Procedure: DEBRIDEMENT OF LEFT KNEE WOUND WITH ACELL AND WOUND VAC PLACEMENT;  Surgeon: Loel Lofty [name], DO;  Location: [facility];  Service: Plastics;  Laterality: Left;  . IR GENERIC HISTORICAL  05/11/2016   IR FLUORO GUIDE CV MIDLINE PICC RIGHT 05/11/2016 Sandi Mariscal, MD [facility]  . IR GENERIC HISTORICAL  05/11/2016   IR US GUIDE VASC ACCESS RIGHT 05/11/2016 Sandi Mariscal, MD [facility]  . KNEE CLOSED REDUCTION Left 01/16/2017   Procedure: CLOSED MANIPULATION LEFT KNEE;  Surgeon: Leandrew Koyanagi, MD;  Location: [place];  Service: Orthopedics;  Laterality: Left;  . KNEE SURGERY     denies  . SKIN SPLIT GRAFT Left 11/16/2015   Procedure: LEFT GASTROC MUSCLE FLAP WITH SKIN GRAFT SPLIT THICKNESS AND VAC PLACEMENT;  Surgeon: Loel Lofty [name], DO;  Location: [facility];  Service: Plastics;  Laterality: Left;  . TOTAL HIP ARTHROPLASTY Right 09/10/2012   Dr Sharol Given  . TOTAL HIP ARTHROPLASTY Right 09/10/2012   Procedure: TOTAL HIP ARTHROPLASTY;  Surgeon: Newt Minion, MD;  Location: [facility];  Service: Orthopedics;  Laterality: Right;  Right Total Hip Arthroplasty  . TOTAL HIP ARTHROPLASTY Left 06/27/2016   Procedure: LEFT TOTAL HIP ARTHROPLASTY ANTERIOR APPROACH;  Surgeon: Leandrew Koyanagi, MD;  Location: [place] [facility];  Service: Orthopedics;  Laterality: Left;  . TOTAL KNEE ARTHROPLASTY Left 09/28/2015   Procedure: TOTAL KNEE ARTHROPLASTY;  Surgeon: Newt Minion, MD;  Location: [facility];  Service: Orthopedics;  Laterality: Left;   Social History   Occupational History  . Occupation: Retired  Tobacco Use  . Smoking status: Former Smoker    Packs/day: 0.50    Years: 50.00  Pack years: 25.00    Types: Cigarettes    Last attempt to quit: 09/28/2015    Years since quitting: 2.9  . Smokeless tobacco: Never Used  . Tobacco comment: quit 09/27/15  Substance and Sexual Activity  . Alcohol use: No  . Drug use: No  . Sexual activity: Never

## 2018-09-09 DIAGNOSIS — I2510 Atherosclerotic heart disease of native coronary artery without angina pectoris: Secondary | ICD-10-CM | POA: Diagnosis not present

## 2018-09-09 DIAGNOSIS — S32810D Multiple fractures of pelvis with stable disruption of pelvic ring, subsequent encounter for fracture with routine healing: Secondary | ICD-10-CM | POA: Diagnosis not present

## 2018-09-09 DIAGNOSIS — J449 Chronic obstructive pulmonary disease, unspecified: Secondary | ICD-10-CM | POA: Diagnosis not present

## 2018-09-09 DIAGNOSIS — A4902 Methicillin resistant Staphylococcus aureus infection, unspecified site: Secondary | ICD-10-CM | POA: Diagnosis not present

## 2018-09-09 DIAGNOSIS — S32511D Fracture of superior rim of right pubis, subsequent encounter for fracture with routine healing: Secondary | ICD-10-CM | POA: Diagnosis not present

## 2018-09-09 DIAGNOSIS — I10 Essential (primary) hypertension: Secondary | ICD-10-CM | POA: Diagnosis not present

## 2018-09-11 DIAGNOSIS — I10 Essential (primary) hypertension: Secondary | ICD-10-CM | POA: Diagnosis not present

## 2018-09-11 DIAGNOSIS — A4902 Methicillin resistant Staphylococcus aureus infection, unspecified site: Secondary | ICD-10-CM | POA: Diagnosis not present

## 2018-09-11 DIAGNOSIS — S32511D Fracture of superior rim of right pubis, subsequent encounter for fracture with routine healing: Secondary | ICD-10-CM | POA: Diagnosis not present

## 2018-09-11 DIAGNOSIS — J449 Chronic obstructive pulmonary disease, unspecified: Secondary | ICD-10-CM | POA: Diagnosis not present

## 2018-09-11 DIAGNOSIS — S32810D Multiple fractures of pelvis with stable disruption of pelvic ring, subsequent encounter for fracture with routine healing: Secondary | ICD-10-CM | POA: Diagnosis not present

## 2018-09-11 DIAGNOSIS — I2510 Atherosclerotic heart disease of native coronary artery without angina pectoris: Secondary | ICD-10-CM | POA: Diagnosis not present

## 2018-09-12 DIAGNOSIS — J449 Chronic obstructive pulmonary disease, unspecified: Secondary | ICD-10-CM | POA: Diagnosis not present

## 2018-09-12 DIAGNOSIS — I2510 Atherosclerotic heart disease of native coronary artery without angina pectoris: Secondary | ICD-10-CM | POA: Diagnosis not present

## 2018-09-12 DIAGNOSIS — S32810D Multiple fractures of pelvis with stable disruption of pelvic ring, subsequent encounter for fracture with routine healing: Secondary | ICD-10-CM | POA: Diagnosis not present

## 2018-09-12 DIAGNOSIS — A4902 Methicillin resistant Staphylococcus aureus infection, unspecified site: Secondary | ICD-10-CM | POA: Diagnosis not present

## 2018-09-12 DIAGNOSIS — I10 Essential (primary) hypertension: Secondary | ICD-10-CM | POA: Diagnosis not present

## 2018-09-12 DIAGNOSIS — S32511D Fracture of superior rim of right pubis, subsequent encounter for fracture with routine healing: Secondary | ICD-10-CM | POA: Diagnosis not present

## 2018-09-16 DIAGNOSIS — A4902 Methicillin resistant Staphylococcus aureus infection, unspecified site: Secondary | ICD-10-CM | POA: Diagnosis not present

## 2018-09-16 DIAGNOSIS — S32810D Multiple fractures of pelvis with stable disruption of pelvic ring, subsequent encounter for fracture with routine healing: Secondary | ICD-10-CM | POA: Diagnosis not present

## 2018-09-16 DIAGNOSIS — I2510 Atherosclerotic heart disease of native coronary artery without angina pectoris: Secondary | ICD-10-CM | POA: Diagnosis not present

## 2018-09-16 DIAGNOSIS — I10 Essential (primary) hypertension: Secondary | ICD-10-CM | POA: Diagnosis not present

## 2018-09-16 DIAGNOSIS — J449 Chronic obstructive pulmonary disease, unspecified: Secondary | ICD-10-CM | POA: Diagnosis not present

## 2018-09-16 DIAGNOSIS — S32511D Fracture of superior rim of right pubis, subsequent encounter for fracture with routine healing: Secondary | ICD-10-CM | POA: Diagnosis not present

## 2018-09-18 DIAGNOSIS — I10 Essential (primary) hypertension: Secondary | ICD-10-CM | POA: Diagnosis not present

## 2018-09-18 DIAGNOSIS — J449 Chronic obstructive pulmonary disease, unspecified: Secondary | ICD-10-CM | POA: Diagnosis not present

## 2018-09-18 DIAGNOSIS — S32810D Multiple fractures of pelvis with stable disruption of pelvic ring, subsequent encounter for fracture with routine healing: Secondary | ICD-10-CM | POA: Diagnosis not present

## 2018-09-18 DIAGNOSIS — A4902 Methicillin resistant Staphylococcus aureus infection, unspecified site: Secondary | ICD-10-CM | POA: Diagnosis not present

## 2018-09-18 DIAGNOSIS — S32511D Fracture of superior rim of right pubis, subsequent encounter for fracture with routine healing: Secondary | ICD-10-CM | POA: Diagnosis not present

## 2018-09-18 DIAGNOSIS — I2510 Atherosclerotic heart disease of native coronary artery without angina pectoris: Secondary | ICD-10-CM | POA: Diagnosis not present

## 2018-09-20 DIAGNOSIS — S32511D Fracture of superior rim of right pubis, subsequent encounter for fracture with routine healing: Secondary | ICD-10-CM | POA: Diagnosis not present

## 2018-09-20 DIAGNOSIS — A4902 Methicillin resistant Staphylococcus aureus infection, unspecified site: Secondary | ICD-10-CM | POA: Diagnosis not present

## 2018-09-20 DIAGNOSIS — J449 Chronic obstructive pulmonary disease, unspecified: Secondary | ICD-10-CM | POA: Diagnosis not present

## 2018-09-20 DIAGNOSIS — I2510 Atherosclerotic heart disease of native coronary artery without angina pectoris: Secondary | ICD-10-CM | POA: Diagnosis not present

## 2018-09-20 DIAGNOSIS — S32810D Multiple fractures of pelvis with stable disruption of pelvic ring, subsequent encounter for fracture with routine healing: Secondary | ICD-10-CM | POA: Diagnosis not present

## 2018-09-20 DIAGNOSIS — Z9181 History of falling: Secondary | ICD-10-CM | POA: Diagnosis not present

## 2018-09-20 DIAGNOSIS — S32509D Unspecified fracture of unspecified pubis, subsequent encounter for fracture with routine healing: Secondary | ICD-10-CM | POA: Diagnosis not present

## 2018-09-20 DIAGNOSIS — E785 Hyperlipidemia, unspecified: Secondary | ICD-10-CM | POA: Diagnosis not present

## 2018-09-20 DIAGNOSIS — I10 Essential (primary) hypertension: Secondary | ICD-10-CM | POA: Diagnosis not present

## 2018-09-21 IMAGING — XA IR FLUORO GUIDE CV LINE*L*
2 series · 7 of 7 positions shown · IV contrast (IODINE)
Comparison: none

INDICATION: Poor venous access. In need of durable intravenous access for
antibiotic administration

EXAM:
ULTRASOUND AND FLUOROSCOPIC GUIDED PICC LINE INSERTION
MEDICATIONS:
None.
CONTRAST:  10 cc [id number]
FLUOROSCOPY TIME:  1 minutes, 48 seconds (2 mGy)
COMPLICATIONS:
None immediate.
TECHNIQUE: The procedure, risks, benefits, and alternatives were explained to
the patient and informed written consent was obtained. A timeout was
performed prior to the initiation of the procedure.

[Series 2: body 4 care · 6 of 6 slices shown]
[im 1/6]
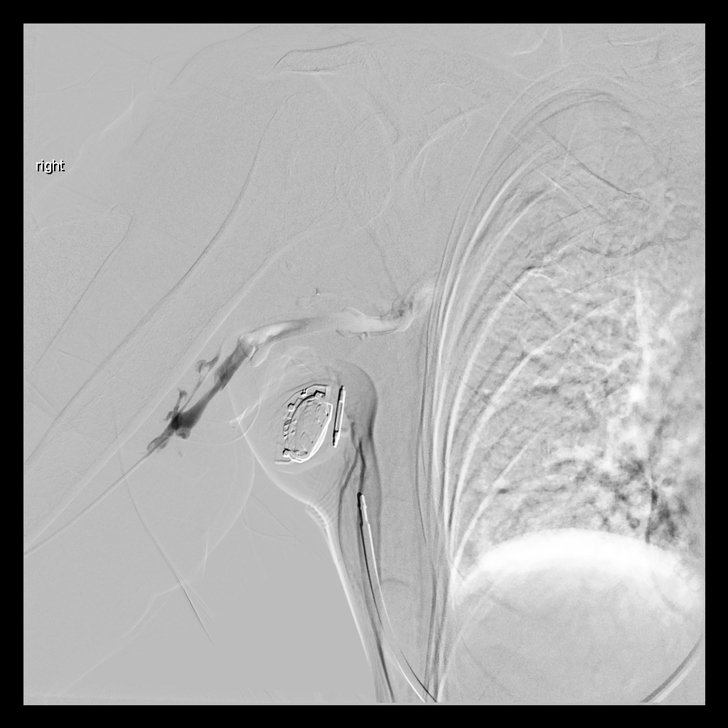
[im 2/6]
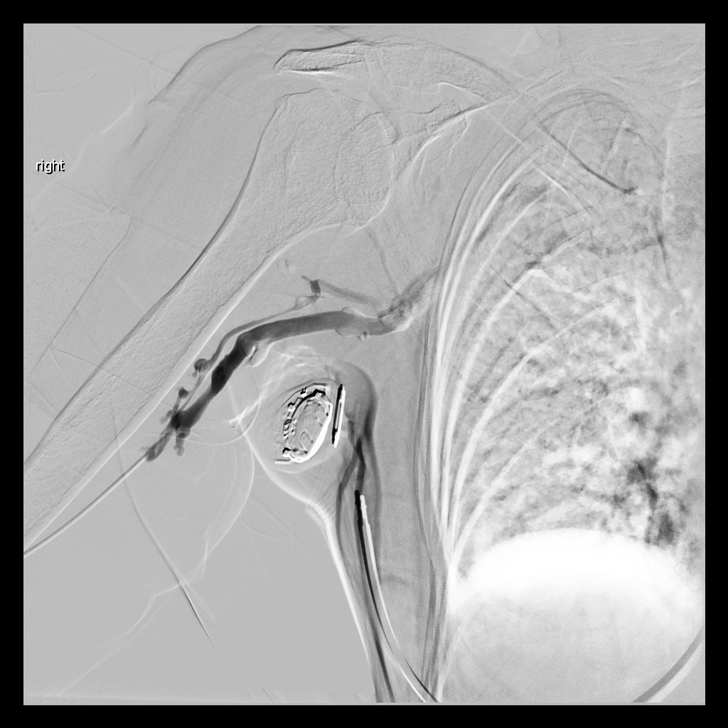
[im 3/6]
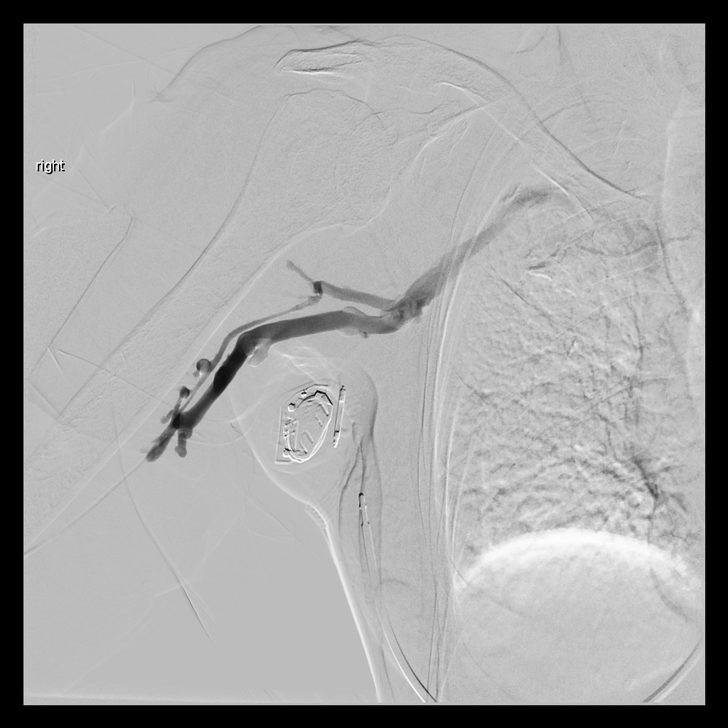
[im 4/6]
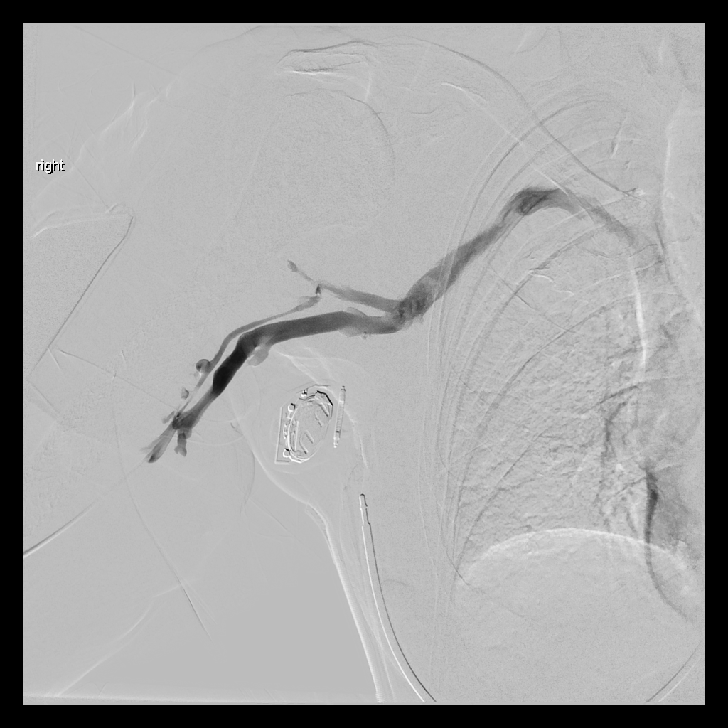
[im 5/6]
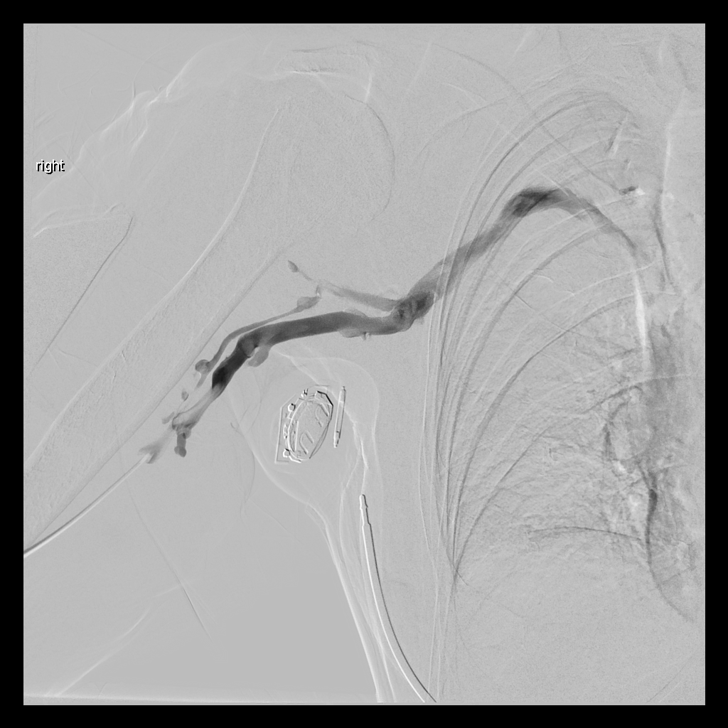
[im 6/6]
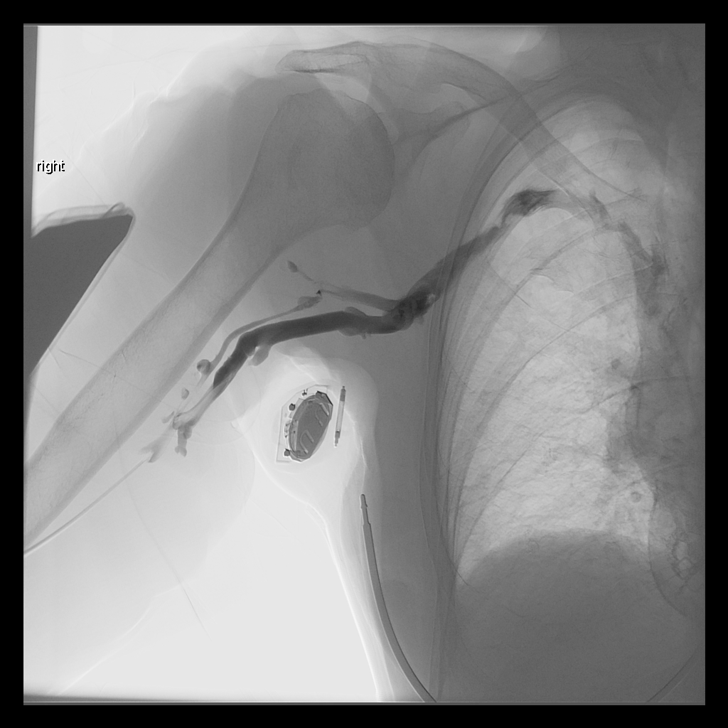

[Series 3: fl (-) angio · 1 of 1 slices shown]
[im 1/1]
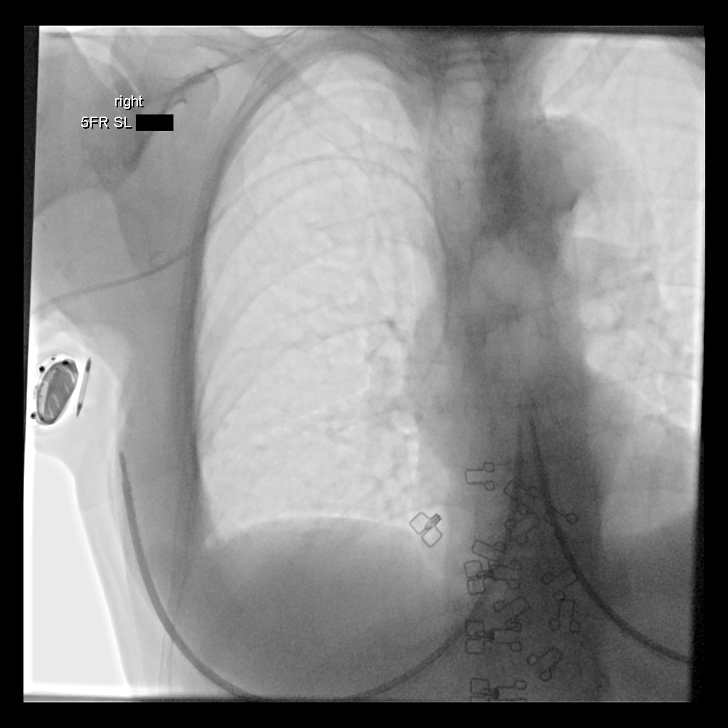

[7 of 7 positions shown; findings below may reference images not displayed]

The right upper extremity was prepped with chlorhexidine in a
sterile fashion, and a sterile drape was applied covering the
operative field. Maximum barrier sterile technique with sterile
gowns and gloves were used for the procedure. A timeout was
performed prior to the initiation of the procedure. Local anesthesia
was provided with 1% lidocaine.

Under direct ultrasound guidance, the right brachial vein was
accessed with a micropuncture kit after the overlying soft tissues
were anesthetized with 1% lidocaine. An ultrasound image was saved
for documentation purposes.

As was difficulty advancing the guidewire centrally, a venogram was
performed through the outer sheath of a micropuncture kit.
Ultimately, a micro wire was advanced into the dominant brachial
vein the level the superior cavoatrial junction, utilized for
measurement purposes and the PICC line was cut to length. A
peel-away sheath was placed and a 33 cm, 5 French, single lumen was
inserted to level of the superior caval-atrial junction. A post
procedure spot fluoroscopic was obtained. The catheter easily
aspirated and flushed and was sutured in place. A dressing was
placed. The patient tolerated the procedure well without immediate
post procedural complication.
FINDINGS: After catheter placement, the tip lies within the superior
cavoatrial junction. The catheter aspirates and flushes normally and
is ready for immediate use.
IMPRESSION: Successful ultrasound and fluoroscopic guided placement of a right
brachial vein approach, 33 cm, 5 French, single lumen PICC with tip
at the superior caval-atrial junction. The PICC line is ready for
immediate use.

## 2018-10-01 ENCOUNTER — Ambulatory Visit (INDEPENDENT_AMBULATORY_CARE_PROVIDER_SITE_OTHER): Payer: Medicare Other | Admitting: Internal Medicine

## 2018-10-01 ENCOUNTER — Encounter: Payer: Self-pay | Admitting: Internal Medicine

## 2018-10-01 DIAGNOSIS — J449 Chronic obstructive pulmonary disease, unspecified: Secondary | ICD-10-CM | POA: Diagnosis not present

## 2018-10-01 DIAGNOSIS — S32810D Multiple fractures of pelvis with stable disruption of pelvic ring, subsequent encounter for fracture with routine healing: Secondary | ICD-10-CM | POA: Diagnosis not present

## 2018-10-01 DIAGNOSIS — I2510 Atherosclerotic heart disease of native coronary artery without angina pectoris: Secondary | ICD-10-CM | POA: Diagnosis not present

## 2018-10-01 DIAGNOSIS — A4902 Methicillin resistant Staphylococcus aureus infection, unspecified site: Secondary | ICD-10-CM | POA: Diagnosis not present

## 2018-10-01 DIAGNOSIS — S32511D Fracture of superior rim of right pubis, subsequent encounter for fracture with routine healing: Secondary | ICD-10-CM | POA: Diagnosis not present

## 2018-10-01 DIAGNOSIS — T8459XD Infection and inflammatory reaction due to other internal joint prosthesis, subsequent encounter: Secondary | ICD-10-CM

## 2018-10-01 DIAGNOSIS — Z96659 Presence of unspecified artificial knee joint: Secondary | ICD-10-CM | POA: Diagnosis not present

## 2018-10-01 DIAGNOSIS — I10 Essential (primary) hypertension: Secondary | ICD-10-CM | POA: Diagnosis not present

## 2018-10-01 NOTE — Progress Notes (Signed)
[facility]  Patient Active Problem List   Diagnosis Date Noted  . MRSA (methicillin resistant Staphylococcus aureus) infection 06/26/2016    Priority: High  . Chronic infection of prosthetic knee ([place]) 10/12/2015    Priority: High  . Closed fracture of multiple pubic rami ([place]) 07/22/2018  . Closed fracture of right superior rim of pubis ([place])   . Lateral dislocation of left patella 01/15/2017  . Unilateral primary osteoarthritis, right knee 01/15/2017  . Osteoporosis 11/26/2016  . Acute pain of left knee 08/27/2016  . Chronic diarrhea 08/07/2016  . Left displaced femoral neck fracture ([place]) 06/26/2016  . Hypertensive heart disease   . Hydronephrosis   . Obstructive uropathy   . Abnormal echocardiogram 01/23/2016  . Pressure ulcer 01/21/2016  . AAA 01/19/2009  . ILIAC ARTERY ANEURYSM 01/19/2009  . Hyperlipidemia 01/14/2009  . TOBACCO ABUSE 01/14/2009  . Essential hypertension 01/14/2009  . Coronary atherosclerosis 01/14/2009  . Cerebrovascular disease 01/14/2009  . Peripheral vascular disease ([place]) 01/14/2009  . CHRONIC OBSTRUCTIVE PULMONARY DISEASE 01/14/2009    Patient's Medications  New Prescriptions   No medications on file  Previous Medications   AMLODIPINE (NORVASC) 10 MG TABLET    Take 1 tablet (10 mg total) by mouth daily.   AMLODIPINE (NORVASC) 5 MG TABLET    TAKE ONE TABLET BY MOUTH DAILY   ASPIRIN EC 81 MG TABLET    Take 81 mg by mouth daily.   ATORVASTATIN (LIPITOR) 40 MG TABLET    TAKE ONE TABLET BY MOUTH DAILY   CALCIUM CARBONATE-VIT D-MIN (CALCIUM 1200 PO)    Take 1 tablet by mouth daily.    CARVEDILOL (COREG) 12.5 MG TABLET    TAKE ONE TABLET BY MOUTH TWICE A DAY   DOXYCYCLINE (VIBRA-TABS) 100 MG TABLET    Take 100 mg by mouth 2 (two) times daily.   FUROSEMIDE (LASIX) 20 MG TABLET    TAKE ONE TABLET BY MOUTH DAILY   LISINOPRIL (PRINIVIL,ZESTRIL) 40 MG TABLET    Take 1 tablet (40 mg total) by mouth daily.   MAGNESIUM OXIDE (MAG-OXIDE) 200 MG TABS    Take 200 mg by mouth every morning.    MESALAMINE (LIALDA) 1.2 G EC TABLET    Take 2.4 g by mouth daily with breakfast.   MULTIPLE VITAMIN (MULITIVITAMIN WITH MINERALS) TABS    Take 1 tablet by mouth daily.   NAPROXEN SODIUM (ALEVE) 220 MG TABLET    Take 220 mg by mouth as needed (pain).    OXYCODONE (OXY IR/ROXICODONE) 5 MG IMMEDIATE RELEASE TABLET    Take 1 tablet (5 mg total) by mouth every 4 (four) hours as needed for breakthrough pain.   POTASSIUM CHLORIDE SA (K-DUR,KLOR-CON) 20 MEQ TABLET    TAKE ONE TABLET BY MOUTH DAILY   PROBIOTIC PRODUCT (PROBIOTIC DAILY PO)    Take 1 capsule by mouth daily.   RIFAMPIN (RIFADIN) 300 MG CAPSULE    Take 300 mg by mouth 2 (two) times daily.  Modified Medications   No medications on file  Discontinued Medications   No medications on file    Subjective: Ms. Sparr is in for her routine follow-up visit.  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  She has more frequent soft stools over the past few years but is not having any liquid diarrhea.  She says she is not sure if that is  related to her antibiotics because she takes so many medications.  She recently fell in her kitchen and suffered a right pubic ramus fracture.  She was in [facility] skilled nursing facility for a while but is back home now.  Review of Systems: Review of Systems  Constitutional: Negative for chills, diaphoresis and fever.  Gastrointestinal: Negative for abdominal pain, diarrhea, nausea and vomiting.  Musculoskeletal: Positive for joint pain.    Past Medical History:  Diagnosis Date  . Arthritis   . CAD (coronary artery disease)    a. 02/2007 s/p PCI/DES ot LCX and RCA;  b. 07/2014 MV: no ischemia/infarct, EF 68%.  . Carotid arterial disease ([place])    a. 05/2015 Carotid U/S: RICA 7-06%, LICA 23-76%, RECA 2-83%, LECA >50% - overall stable, f/u 1  yr.  . Complication of anesthesia    Three days after procedure pt statrted to suffer with memoy loss that took about 4-5 days to come back according to [name]."  . Dilated cardiomyopathy ([place])    a. 05/2005 Echo: EF 65%; b. 01/22/2016 Echo: EF 25-30% (in setting of admission for urosepsis w/ elev trop); c. 01/26/2016 Echo: EF 35-40%, inf, distal apical, apical, and mid to distal septal HK-->felt to be 2/2 stress induced CM.  [name] Heart murmur   . History of bronchitis   . Hyperlipidemia   . Hypertension   . Hypertensive heart disease   . Memory impairment   . MRSA (methicillin resistant staph aureus) culture positive   . Nephrolithiasis    a. 12/2015 UVJ stone w/ hydroureteronephrosis req nephrostomy tube placement.  . Osteoarthritis    a. 07/5174 s/p L TKA complicated by infections req skin grafting.  [name] PVD (peripheral vascular disease) ([place])    a. Bilat common iliac dzs, nl ABI's --> med rx.  . Sepsis ([place])    a. 12/2015 admitted with Proteus mirabilis urosepsis/bacteremia.  . Wears glasses     Social History   Tobacco Use  . Smoking status: Former Smoker    Packs/day: 0.50    Years: 50.00    Pack years: 25.00    Types: Cigarettes    Last attempt to quit: 09/28/2015    Years since quitting: 3.0  . Smokeless tobacco: Never Used  . Tobacco comment: quit 09/27/15  Substance Use Topics  . Alcohol use: No  . Drug use: No    Family History  Problem Relation Age of Onset  . Breast cancer Mother   . Leukemia Father   . Stroke Sister   . Bone cancer Other     No Known Allergies  Objective: Vitals:   10/01/18 1336  BP: (!) 176/81  Pulse: 67  Temp: (!) 97.5 F (36.4 C)  Weight: 11 lb (4.99 kg)   Body mass index is 2.01 kg/m.  Physical Exam Constitutional:      Comments: She is pleasant and in no distress.  She is accompanied by 1 of her sons.  Musculoskeletal:     Comments: She has a 10 mm superficial ulcer on her lateral side of her left kneecap.  She says this  developed about 1 month ago when she stopped wearing her brace and that area began rubbing against her pants.  She has not had any significant drainage.  She is trying to keep it covered with a Band-Aid now.     Lab Results    Problem List Items Addressed This Visit      High   Chronic infection of prosthetic knee (  [place])    I think the chance that her prosthetic knee infection has been cured is extremely low given that her infection smoldered 4 months before it came under control 2-1/2 years ago.  I told her that I believe there is significant risk that her infection would flareup if she were to stop her antibiotics now.  She agrees with continuing them now.  She will follow-up with me in 1 year.          Michel Bickers, MD [facility] [phone] pager   [phone] cell 10/01/2018, 2:05 PM

## 2018-10-01 NOTE — Assessment & Plan Note (Signed)
I think the chance that her prosthetic knee infection has been cured is extremely low given that her infection smoldered 4 months before it came under control 2-1/2 years ago.  I told her that I believe there is significant risk that her infection would flareup if she were to stop her antibiotics now.  She agrees with continuing them now.  She will follow-up with me in 1 year.

## 2018-10-03 ENCOUNTER — Encounter: Payer: Self-pay | Admitting: Family Medicine

## 2018-10-03 ENCOUNTER — Other Ambulatory Visit: Payer: Self-pay | Admitting: Family Medicine

## 2018-10-03 NOTE — Telephone Encounter (Signed)
I have not seen this pt in over a year - cannot fill oxycodone

## 2018-10-03 NOTE — Telephone Encounter (Signed)
Copied from [place] [phone]. Topic: Quick Communication - Rx Refill/Question >> Oct 03, 2018 12:22 PM Sheran Luz wrote: Medication: oxyCODONE (OXY IR/ROXICODONE) 5 MG immediate release tablet   Patient is requesting a refill of this medication. Advised patient that she would need appointment. Patient denied and states that she does not like the way this is being handled and would like to request this medication without office visit.   Preferred Pharmacy (with phone number or street name):[facility] at [place], [place] - [place]  [phone] (Phone) [phone] (Fax)

## 2018-11-04 NOTE — Progress Notes (Signed)
[facility] at [facility] [address] [place], [place] [address] [phone]  Date:  11/05/2018   Name:  [name]   DOB:  [birth year]   MRN:  [id number]  PCP:  Darreld Mclean, MD    Chief Complaint: No chief complaint on file.   History of Present Illness:  [name] is a 78 y.o. very pleasant female patient who presents with the following:  History of HTN, AAA, PVD, HTN, COPD, CAD s/p PCI in 2009, chronic left knee infection   I last saw her in November of 2018 She got admitted in December with a fall and pelvic fracture  They increased her amlodipine from 5-10 due to elevated blood pressure during admission  She saw cardiology in November: 1 coronary artery disease-patient denies chest pain.  Continue medical therapy with aspirin and statin. 2 hypertension-patient's blood pressure is controlled.  Continue present medications and follow.  Check potassium and renal function. 3 hyperlipidemia-continue statin.  Check lipids and liver. 4 tobacco abuse-patient has discontinued. 5 history of question RA mass-we will repeat echocardiogram. 6 carotid artery disease-mild on most recent carotid Dopplers.  Will repeat study October 2020. 7 history of stress-induced cardiomyopathy-LV function improved on most recent echocardiogram. 8 mild aortic stenosis/aortic insufficiency-she will need follow-up echoes in the future. 9 peripheral vascular disease-continue aspirin and statin.  She also sees ID for chronic prosthetic knee infection-she was seen earlier this month for "chronic smoldering MRSA infection of left prosthetic knee" She is on chronic doxycycline and rifampin for suppression-they are going to continue this for now, and follow-up in 1 year.  Per patient she expects to be on this regimen for the long-term  She notes that 3-4 years ago she had gout in her hands, and took some medication for this but cannot remember  what it is that she took  She thinks it was colchicine when I asked her Now has sx in just her left hand It has bothered her for about a week No injury to her hand   She has not been checking her BP as often recently, but she notes that her BP looked good when she was in rehab from her pelvic fracture She feels likes her pelvic fracture has healed well, it is not giving her any pain  BP Readings from Last 3 Encounters:  10/01/18 (!) 176/81  07/25/18 (!) 167/78  06/09/18 128/86    Patient Active Problem List   Diagnosis Date Noted  . Closed fracture of multiple pubic rami ([place]) 07/22/2018  . Closed fracture of right superior rim of pubis ([place])   . Lateral dislocation of left patella 01/15/2017  . Unilateral primary osteoarthritis, right knee 01/15/2017  . Osteoporosis 11/26/2016  . Acute pain of left knee 08/27/2016  . Chronic diarrhea 08/07/2016  . Left displaced femoral neck fracture ([place]) 06/26/2016  . MRSA (methicillin resistant Staphylococcus aureus) infection 06/26/2016  . Hypertensive heart disease   . Hydronephrosis   . Obstructive uropathy   . Abnormal echocardiogram 01/23/2016  . Pressure ulcer 01/21/2016  . Chronic infection of prosthetic knee ([place]) 10/12/2015  . AAA 01/19/2009  . ILIAC ARTERY ANEURYSM 01/19/2009  . Hyperlipidemia 01/14/2009  . TOBACCO ABUSE 01/14/2009  . Essential hypertension 01/14/2009  . Coronary atherosclerosis 01/14/2009  . Cerebrovascular disease 01/14/2009  . Peripheral vascular disease ([place]) 01/14/2009  . CHRONIC OBSTRUCTIVE PULMONARY DISEASE 01/14/2009    Past Medical History:  Diagnosis Date  .  Arthritis   . CAD (coronary artery disease)    a. 02/2007 s/p PCI/DES ot LCX and RCA;  b. 07/2014 MV: no ischemia/infarct, EF 68%.  . Carotid arterial disease ([place])    a. 05/2015 Carotid U/S: RICA 5-36%, LICA 14-43%, RECA 1-54%, LECA >50% - overall stable, f/u 1 yr.  . Complication of anesthesia    Three days after procedure pt statrted to  suffer with memoy loss that took about 4-5 days to come back according to [name]."  . Dilated cardiomyopathy ([place])    a. 05/2005 Echo: EF 65%; b. 01/22/2016 Echo: EF 25-30% (in setting of admission for urosepsis w/ elev trop); c. 01/26/2016 Echo: EF 35-40%, inf, distal apical, apical, and mid to distal septal HK-->felt to be 2/2 stress induced CM.  [name] Heart murmur   . History of bronchitis   . Hyperlipidemia   . Hypertension   . Hypertensive heart disease   . Memory impairment   . MRSA (methicillin resistant staph aureus) culture positive   . Nephrolithiasis    a. 12/2015 UVJ stone w/ hydroureteronephrosis req nephrostomy tube placement.  . Osteoarthritis    a. 0/0867 s/p L TKA complicated by infections req skin grafting.  [name] PVD (peripheral vascular disease) ([place])    a. Bilat common iliac dzs, nl ABI's --> med rx.  . Sepsis ([place])    a. 12/2015 admitted with Proteus mirabilis urosepsis/bacteremia.  . Wears glasses     Past Surgical History:  Procedure Laterality Date  . ABDOMINAL HYSTERECTOMY    . APPLICATION OF A-CELL OF EXTREMITY Left 12/08/2015   Procedure: APPLICATION OF A-CELL OF knee;  Surgeon: Loel Lofty [name], DO;  Location: [facility];  Service: Plastics;  Laterality: Left;  . APPLICATION OF WOUND VAC Left 11/16/2015   Procedure: APPLICATION OF WOUND VAC;  Surgeon: Loel Lofty [name], DO;  Location: [facility];  Service: Plastics;  Laterality: Left;  . APPLICATION OF WOUND VAC Left 12/08/2015   Procedure: APPLICATION OF WOUND VAC  AND CAST to knee;  Surgeon: Loel Lofty [name], DO;  Location: [facility];  Service: Plastics;  Laterality: Left;  . COLONOSCOPY    . CORONARY ANGIOPLASTY  07   3 stents placed  . ESOPHAGOGASTRODUODENOSCOPY    . EYE SURGERY Bilateral    cataracts  . FREE FLAP TO EXTREMITY Left 11/16/2015   Procedure: FREE FLAP TO EXTREMITY;  Surgeon: Loel Lofty [name], DO;  Location: [facility];  Service: Plastics;  Laterality: Left;  . I&D EXTREMITY Left 10/18/2015    Procedure: Left Knee Washout, Reclosure;  Surgeon: Newt Minion, MD;  Location: [facility];  Service: Orthopedics;  Laterality: Left;  . I&D EXTREMITY Left 12/08/2015   Procedure: IRRIGATION AND DEBRIDEMENT knee;  Surgeon: Loel Lofty [name], DO;  Location: [facility];  Service: Plastics;  Laterality: Left;  . I&D KNEE WITH POLY EXCHANGE Left 10/12/2015   Procedure: Irrigation and Debridement , Poly Exchange, Antibiotic Beads Left Knee;  Surgeon: Newt Minion, MD;  Location: [facility] [place];  Service: Orthopedics;  Laterality: Left;  . INCISION AND DRAINAGE OF WOUND Left 01/04/2016   Procedure: DEBRIDEMENT OF LEFT KNEE WOUND WITH ACELL AND WOUND VAC PLACEMENT;  Surgeon: Loel Lofty [name], DO;  Location: [facility];  Service: Plastics;  Laterality: Left;  . IR GENERIC HISTORICAL  05/11/2016   IR FLUORO GUIDE CV MIDLINE PICC RIGHT 05/11/2016 Sandi Mariscal, MD [facility]  . IR GENERIC HISTORICAL  05/11/2016   IR US GUIDE VASC ACCESS RIGHT 05/11/2016 Sandi Mariscal,  MD [facility]  . KNEE CLOSED REDUCTION Left 01/16/2017   Procedure: CLOSED MANIPULATION LEFT KNEE;  Surgeon: Leandrew Koyanagi, MD;  Location: [facility];  Service: Orthopedics;  Laterality: Left;  . KNEE SURGERY     denies  . SKIN SPLIT GRAFT Left 11/16/2015   Procedure: LEFT GASTROC MUSCLE FLAP WITH SKIN GRAFT SPLIT THICKNESS AND VAC PLACEMENT;  Surgeon: Loel Lofty [name], DO;  Location: [facility];  Service: Plastics;  Laterality: Left;  . TOTAL HIP ARTHROPLASTY Right 09/10/2012   Dr Sharol Given  . TOTAL HIP ARTHROPLASTY Right 09/10/2012   Procedure: TOTAL HIP ARTHROPLASTY;  Surgeon: Newt Minion, MD;  Location: [facility];  Service: Orthopedics;  Laterality: Right;  Right Total Hip Arthroplasty  . TOTAL HIP ARTHROPLASTY Left 06/27/2016   Procedure: LEFT TOTAL HIP ARTHROPLASTY ANTERIOR APPROACH;  Surgeon: Leandrew Koyanagi, MD;  Location: [facility];  Service: Orthopedics;  Laterality: Left;  . TOTAL KNEE ARTHROPLASTY Left 09/28/2015   Procedure: TOTAL KNEE ARTHROPLASTY;   Surgeon: Newt Minion, MD;  Location: [facility];  Service: Orthopedics;  Laterality: Left;    Social History   Tobacco Use  . Smoking status: Former Smoker    Packs/day: 0.50    Years: 50.00    Pack years: 25.00    Types: Cigarettes    Last attempt to quit: 09/28/2015    Years since quitting: 3.1  . Smokeless tobacco: Never Used  . Tobacco comment: quit 09/27/15  Substance Use Topics  . Alcohol use: No  . Drug use: No    Family History  Problem Relation Age of Onset  . Breast cancer Mother   . Leukemia Father   . Stroke Sister   . Bone cancer Other     No Known Allergies  Medication list has been reviewed and updated.  Current Outpatient Medications on File Prior to Visit  Medication Sig Dispense Refill  . amLODipine (NORVASC) 10 MG tablet Take 1 tablet (10 mg total) by mouth daily.    [name] amLODipine (NORVASC) 5 MG tablet TAKE ONE TABLET BY MOUTH DAILY 90 tablet 4  . aspirin EC 81 MG tablet Take 81 mg by mouth daily.    [name] atorvastatin (LIPITOR) 40 MG tablet TAKE ONE TABLET BY MOUTH DAILY (Patient taking differently: Take 40 mg by mouth daily at 6 PM. ) 90 tablet 1  . Calcium Carbonate-Vit D-Min (CALCIUM 1200 PO) Take 1 tablet by mouth daily.     . carvedilol (COREG) 12.5 MG tablet TAKE ONE TABLET BY MOUTH TWICE A DAY 180 tablet 4  . doxycycline (VIBRA-TABS) 100 MG tablet Take 100 mg by mouth 2 (two) times daily.    . furosemide (LASIX) 20 MG tablet TAKE ONE TABLET BY MOUTH DAILY 90 tablet 4  . lisinopril (PRINIVIL,ZESTRIL) 40 MG tablet Take 1 tablet (40 mg total) by mouth daily. 90 tablet 3  . Magnesium Oxide (MAG-OXIDE) 200 MG TABS Take 200 mg by mouth every morning.     . mesalamine (LIALDA) 1.2 g EC tablet Take 2.4 g by mouth daily with breakfast.    . Multiple Vitamin (MULITIVITAMIN WITH MINERALS) TABS Take 1 tablet by mouth daily.    . naproxen sodium (ALEVE) 220 MG tablet Take 220 mg by mouth as needed (pain).     [name] oxyCODONE (OXY IR/ROXICODONE) 5 MG immediate release  tablet Take 1 tablet (5 mg total) by mouth every 4 (four) hours as needed for breakthrough pain. 20 tablet 0  . potassium chloride SA (  K-DUR,KLOR-CON) 20 MEQ tablet TAKE ONE TABLET BY MOUTH DAILY (Patient taking differently: Take 20 mEq by mouth daily. ) 90 tablet 3  . Probiotic Product (PROBIOTIC DAILY PO) Take 1 capsule by mouth daily.    . rifampin (RIFADIN) 300 MG capsule Take 300 mg by mouth 2 (two) times daily.     No current facility-administered medications on file prior to visit.     Review of Systems:  As per HPI- otherwise negative. No fever or gout She is overall feeling well- no other concerns today   Physical Examination: There were no vitals filed for this visit. There were no vitals filed for this visit. There is no height or weight on file to calculate BMI. Ideal Body Weight:    Spoke with patient on phone as we could not get a web video to work.  She sounds well, no distress, cough, wheezing noted  Assessment and Plan: Acute gout of left hand, unspecified cause - Plan: predniSONE (DELTASONE) 20 MG tablet   Phone visit due to concern of gout.  Colchicine is problematic due to her carvedilol, and prefer to avoid NSAIDs due to her cardiac disease.  I contacted her infectious disease doctor about using prednisone (due to her knee infection), he is okay with this medication Let the patient know that called in prednisone, she is to alert me if not helpful in 1 to 2 days  Spoke with patient for just under 10 minutes Made her a follow-up appointment for June for a face-to-face visit Signed Lamar Blinks, MD

## 2018-11-05 ENCOUNTER — Encounter: Payer: Self-pay | Admitting: Family Medicine

## 2018-11-05 ENCOUNTER — Other Ambulatory Visit: Payer: Self-pay

## 2018-11-05 ENCOUNTER — Ambulatory Visit (INDEPENDENT_AMBULATORY_CARE_PROVIDER_SITE_OTHER): Payer: Medicare Other | Admitting: Family Medicine

## 2018-11-05 DIAGNOSIS — M109 Gout, unspecified: Secondary | ICD-10-CM | POA: Diagnosis not present

## 2018-11-05 MED ORDER — PREDNISONE 20 MG PO TABS: ORAL_TABLET | ORAL | 0 refills | Status: DC

## 2018-11-05 NOTE — Patient Instructions (Addendum)
It was very nice to talk with you on the phone today Please try the prednisone medication for your gout Let me know if this is not helpful I will look forward to seeing you in June in the office

## 2018-11-21 ENCOUNTER — Other Ambulatory Visit: Payer: Self-pay | Admitting: Family Medicine

## 2018-11-21 ENCOUNTER — Encounter: Payer: Self-pay | Admitting: Family Medicine

## 2018-11-21 DIAGNOSIS — M109 Gout, unspecified: Secondary | ICD-10-CM

## 2018-11-21 NOTE — Telephone Encounter (Signed)
Please review for tapering medication, prednisone 20 mg tab. Dr Lenna Sciara. [place]

## 2018-11-21 NOTE — Telephone Encounter (Signed)
Is this patients long term medication or will she needs virtual visit?

## 2018-11-22 ENCOUNTER — Other Ambulatory Visit: Payer: Self-pay | Admitting: Family Medicine

## 2018-11-22 DIAGNOSIS — M109 Gout, unspecified: Secondary | ICD-10-CM

## 2018-11-24 ENCOUNTER — Other Ambulatory Visit: Payer: Self-pay | Admitting: Family Medicine

## 2018-11-25 NOTE — Telephone Encounter (Signed)
Spoke with patient and she stated that the pain is back and she cannot hardly sleep and needs medication bad.  She states that computer is like foreign language to her.  So please call patient back.

## 2018-11-25 NOTE — Telephone Encounter (Signed)
Phone message sent to [place]

## 2018-11-26 ENCOUNTER — Ambulatory Visit: Payer: Medicare Other | Admitting: Family Medicine

## 2018-11-26 NOTE — Telephone Encounter (Signed)
Called her- I had not realized that she did not know how to respond to the [employer].  I had prescribed 6 days of prednisone for her The prednisone helped for a bit, but then the sx returned after a couple of days  We made her an appt to be seen in the office later today

## 2018-11-26 NOTE — Progress Notes (Addendum)
[facility] [address], [place],  [address] [phone]  Date:  11/27/2018   Name:  [name]   DOB:  08/28/1940   MRN:  [id number]  PCP:  Darreld Mclean, MD    Chief Complaint: Gout (med refill, hand pain, left hand worse, moving to feet)   History of Present Illness:  [name] is a 78 y.o. very pleasant female patient who presents with the following:  In person visit today to follow-up on hand pain- thought due to gout History of HTN, AAA, PVD, HTN, COPD, CAD s/p PCI in 2009, chronic left knee infection  Pt had called in on 4/8 with concern of pain in her left hand- we treated her with 6 days of prednisone (with approval of her ID doc)  She then contacted me for a refill of pred- I contacted her and we learned that her pain came back. Asked her to come in so we can examine her hand   Today she notes that her left hand tends to hurt, and may fall asleep during the night.  She has noted this for a month or so.  She notes that her entire hand, not just one joint will hurt.  It also seems to be swollen in the morning but this resolves within just a few moments upon awakening?  There is no redness or swelling of any of the joints She also notes that both feet are sometimes tingling.  This is been present for about the same period of time  Patient Active Problem List   Diagnosis Date Noted  . Closed fracture of multiple pubic rami ([place]) 07/22/2018  . Closed fracture of right superior rim of pubis ([place])   . Lateral dislocation of left patella 01/15/2017  . Unilateral primary osteoarthritis, right knee 01/15/2017  . Osteoporosis 11/26/2016  . Acute pain of left knee 08/27/2016  . Chronic diarrhea 08/07/2016  . Left displaced femoral neck fracture ([place]) 06/26/2016  . MRSA (methicillin resistant Staphylococcus aureus) infection 06/26/2016  . Hypertensive heart disease   . Hydronephrosis   .  Obstructive uropathy   . Abnormal echocardiogram 01/23/2016  . Pressure ulcer 01/21/2016  . Chronic infection of prosthetic knee ([place]) 10/12/2015  . AAA 01/19/2009  . ILIAC ARTERY ANEURYSM 01/19/2009  . Hyperlipidemia 01/14/2009  . TOBACCO ABUSE 01/14/2009  . Essential hypertension 01/14/2009  . Coronary atherosclerosis 01/14/2009  . Cerebrovascular disease 01/14/2009  . Peripheral vascular disease ([place]) 01/14/2009  . CHRONIC OBSTRUCTIVE PULMONARY DISEASE 01/14/2009    Past Medical History:  Diagnosis Date  . Arthritis   . CAD (coronary artery disease)    a. 02/2007 s/p PCI/DES ot LCX and RCA;  b. 07/2014 MV: no ischemia/infarct, EF 68%.  . Carotid arterial disease ([place])    a. 05/2015 Carotid U/S: RICA 9-98%, LICA 33-82%, RECA 5-05%, LECA >50% - overall stable, f/u 1 yr.  . Complication of anesthesia    Three days after procedure pt statrted to suffer with memoy loss that took about 4-5 days to come back according to [name]."  . Dilated cardiomyopathy ([place])    a. 05/2005 Echo: EF 65%; b. 01/22/2016 Echo: EF 25-30% (in setting of admission for urosepsis w/ elev trop); c. 01/26/2016 Echo: EF 35-40%, inf, distal apical, apical, and mid to distal septal HK-->felt to be 2/2 stress induced CM.  [name] Heart murmur   . History of bronchitis   . Hyperlipidemia   .  Hypertension   . Hypertensive heart disease   . Memory impairment   . MRSA (methicillin resistant staph aureus) culture positive   . Nephrolithiasis    a. 12/2015 UVJ stone w/ hydroureteronephrosis req nephrostomy tube placement.  . Osteoarthritis    a. 0/9233 s/p L TKA complicated by infections req skin grafting.  [name] PVD (peripheral vascular disease) ([place])    a. Bilat common iliac dzs, nl ABI's --> med rx.  . Sepsis ([place])    a. 12/2015 admitted with Proteus mirabilis urosepsis/bacteremia.  . Wears glasses     Past Surgical History:  Procedure Laterality Date  . ABDOMINAL HYSTERECTOMY    . APPLICATION OF A-CELL OF EXTREMITY  Left 12/08/2015   Procedure: APPLICATION OF A-CELL OF knee;  Surgeon: Loel Lofty [name], DO;  Location: [facility];  Service: Plastics;  Laterality: Left;  . APPLICATION OF WOUND VAC Left 11/16/2015   Procedure: APPLICATION OF WOUND VAC;  Surgeon: Loel Lofty [name], DO;  Location: [facility];  Service: Plastics;  Laterality: Left;  . APPLICATION OF WOUND VAC Left 12/08/2015   Procedure: APPLICATION OF WOUND VAC  AND CAST to knee;  Surgeon: Loel Lofty [name], DO;  Location: [facility];  Service: Plastics;  Laterality: Left;  . COLONOSCOPY    . CORONARY ANGIOPLASTY  07   3 stents placed  . ESOPHAGOGASTRODUODENOSCOPY    . EYE SURGERY Bilateral    cataracts  . FREE FLAP TO EXTREMITY Left 11/16/2015   Procedure: FREE FLAP TO EXTREMITY;  Surgeon: Loel Lofty [name], DO;  Location: [facility];  Service: Plastics;  Laterality: Left;  . I&D EXTREMITY Left 10/18/2015   Procedure: Left Knee Washout, Reclosure;  Surgeon: Newt Minion, MD;  Location: [facility];  Service: Orthopedics;  Laterality: Left;  . I&D EXTREMITY Left 12/08/2015   Procedure: IRRIGATION AND DEBRIDEMENT knee;  Surgeon: Loel Lofty [name], DO;  Location: [facility];  Service: Plastics;  Laterality: Left;  . I&D KNEE WITH POLY EXCHANGE Left 10/12/2015   Procedure: Irrigation and Debridement , Poly Exchange, Antibiotic Beads Left Knee;  Surgeon: Newt Minion, MD;  Location: [facility];  Service: Orthopedics;  Laterality: Left;  . INCISION AND DRAINAGE OF WOUND Left 01/04/2016   Procedure: DEBRIDEMENT OF LEFT KNEE WOUND WITH ACELL AND WOUND VAC PLACEMENT;  Surgeon: Loel Lofty [name], DO;  Location: [facility];  Service: Plastics;  Laterality: Left;  . IR GENERIC HISTORICAL  05/11/2016   IR FLUORO GUIDE CV MIDLINE PICC RIGHT 05/11/2016 Sandi Mariscal, MD [facility]INTER[facility]  . IR GENERIC HISTORICAL  05/11/2016   IR US GUIDE VASC ACCESS RIGHT 05/11/2016 Sandi Mariscal, MD [facility]  . KNEE CLOSED REDUCTION Left 01/16/2017   Procedure: CLOSED MANIPULATION LEFT KNEE;  Surgeon: Leandrew Koyanagi, MD;  Location: [facility];  Service: Orthopedics;  Laterality: Left;  . KNEE SURGERY     denies  . SKIN SPLIT GRAFT Left 11/16/2015   Procedure: LEFT GASTROC MUSCLE FLAP WITH SKIN GRAFT SPLIT THICKNESS AND VAC PLACEMENT;  Surgeon: Loel Lofty [name], DO;  Location: [facility];  Service: Plastics;  Laterality: Left;  . TOTAL HIP ARTHROPLASTY Right 09/10/2012   Dr Sharol Given  . TOTAL HIP ARTHROPLASTY Right 09/10/2012   Procedure: TOTAL HIP ARTHROPLASTY;  Surgeon: Newt Minion, MD;  Location: [facility];  Service: Orthopedics;  Laterality: Right;  Right Total Hip Arthroplasty  . TOTAL HIP ARTHROPLASTY Left 06/27/2016   Procedure: LEFT TOTAL HIP ARTHROPLASTY ANTERIOR APPROACH;  Surgeon: Leandrew Koyanagi, MD;  Location:  [facility];  Service: Orthopedics;  Laterality: Left;  . TOTAL KNEE ARTHROPLASTY Left 09/28/2015   Procedure: TOTAL KNEE ARTHROPLASTY;  Surgeon: Newt Minion, MD;  Location: [facility];  Service: Orthopedics;  Laterality: Left;    Social History   Tobacco Use  . Smoking status: Former Smoker    Packs/day: 0.50    Years: 50.00    Pack years: 25.00    Types: Cigarettes    Last attempt to quit: 09/28/2015    Years since quitting: 3.1  . Smokeless tobacco: Never Used  . Tobacco comment: quit 09/27/15  Substance Use Topics  . Alcohol use: No  . Drug use: No    Family History  Problem Relation Age of Onset  . Breast cancer Mother   . Leukemia Father   . Stroke Sister   . Bone cancer Other     No Known Allergies  Medication list has been reviewed and updated.  Current Outpatient Medications on File Prior to Visit  Medication Sig Dispense Refill  . amLODipine (NORVASC) 10 MG tablet Take 1 tablet (10 mg total) by mouth daily.    [name] amLODipine (NORVASC) 5 MG tablet TAKE ONE TABLET BY MOUTH DAILY 90 tablet 4  . aspirin EC 81 MG tablet Take 81 mg by mouth daily.    [name] atorvastatin (LIPITOR) 40 MG tablet TAKE ONE TABLET BY MOUTH DAILY (Patient taking differently: Take 40 mg by  mouth daily at 6 PM. ) 90 tablet 1  . Calcium Carbonate-Vit D-Min (CALCIUM 1200 PO) Take 1 tablet by mouth daily.     . carvedilol (COREG) 12.5 MG tablet TAKE ONE TABLET BY MOUTH TWICE A DAY 180 tablet 4  . doxycycline (VIBRA-TABS) 100 MG tablet Take 100 mg by mouth 2 (two) times daily.    . furosemide (LASIX) 20 MG tablet TAKE ONE TABLET BY MOUTH DAILY 90 tablet 4  . Magnesium Oxide (MAG-OXIDE) 200 MG TABS Take 200 mg by mouth every morning.     . Multiple Vitamin (MULITIVITAMIN WITH MINERALS) TABS Take 1 tablet by mouth daily.    . naproxen sodium (ALEVE) 220 MG tablet Take 220 mg by mouth as needed (pain).     [name] oxyCODONE (OXY IR/ROXICODONE) 5 MG immediate release tablet Take 1 tablet (5 mg total) by mouth every 4 (four) hours as needed for breakthrough pain. 20 tablet 0  . potassium chloride SA (K-DUR,KLOR-CON) 20 MEQ tablet TAKE ONE TABLET BY MOUTH DAILY (Patient taking differently: Take 20 mEq by mouth daily. ) 90 tablet 3  . predniSONE (DELTASONE) 20 MG tablet Take 2 pills (40mg ) for 3 days, then 20 mg for 4 days-use for gout 10 tablet 0  . Probiotic Product (PROBIOTIC DAILY PO) Take 1 capsule by mouth daily.    . rifampin (RIFADIN) 300 MG capsule Take 300 mg by mouth 2 (two) times daily.    [name] lisinopril (PRINIVIL,ZESTRIL) 40 MG tablet Take 1 tablet (40 mg total) by mouth daily. 90 tablet 3  . mesalamine (LIALDA) 1.2 g EC tablet Take 2.4 g by mouth daily with breakfast.     No current facility-administered medications on file prior to visit.     Review of Systems:  As per HPI- otherwise negative.   Physical Examination: Vitals:   11/27/18 1033  BP: 124/84  Pulse: (!) 54  Resp: 16  Temp: 98.3 F (36.8 C)  SpO2: 95%   Vitals:   11/27/18 1033  Weight: 121 lb (54.9 kg)  Height: 5\' 2"  (1.575 m)  Body mass index is 22.13 kg/m. Ideal Body Weight: Weight in (lb) to have BMI = 25: 136.4  GEN: WDWN, NAD, Non-toxic, A & O x 3, looks well.  Small and frail elderly woman HEENT:  Atraumatic, Normocephalic. Neck supple. No masses, No LAD. Ears and Nose: No external deformity. CV: RRR, No M/G/R. No JVD. No thrill. No extra heart sounds. PULM: CTA B, no wheezes, crackles, rhonchi. No retractions. No resp. distress. No accessory muscle use. EXTR: No c/c/e NEURO sitting in wheelchair today due to long walk back to my office.  She has a chronic infection of her left knee which makes walking difficult PSYCH: Normally interactive. Conversant. Not depressed or anxious appearing.  Calm demeanor.  Her left hand appears quite normal, except for osteoarthritis causing thickening of the joints.  There is no redness, tenderness to movement or palpation, swelling, or stiffness.  I do not see any evidence of gout The hand is neurovascularly intact Examined both feet.  They are warm and well-perfused, as I cannot palpate a dorsal pulse.  She is able to sense about 75% of monofilament sites   Assessment and Plan: Numbness and tingling of both feet - Plan: B12 and Folate Panel, Uric acid, Ferritin, Basic metabolic panel  Pain in finger of left hand - Plan: Uric acid  History of anemia - Plan: B12 and Folate Panel, Ferritin  Acute gout of left hand, unspecified cause - Plan: predniSONE (DELTASONE) 20 MG tablet  Yulitza is here today with concern of gout.  However I explained to her that I really do not think her symptoms are consistent with gout.  We will check a uric acid level.  She feels that she got a lot of relief from a few days of prednisone, I am okay with repeating this I suspect her left hand symptoms are actually due to carpal tunnel syndrome.  We have given her a wrist brace to wear at night Otherwise await other labs as above, will be in touch with her pending these results  Signed Lamar Blinks, MD  addnd 5/4-called patient to go over her labs She notes that her sx are better- she is not sure if this is due to the hand brace or prednisone She will finish out her  prednisone, let me know if symptoms return Results for orders placed or performed in visit on 11/27/18  B12 and Folate Panel  Result Value Ref Range   Vitamin B-12 1,505 (H) 211 - 911 pg/mL   Folate >23.3 >5.9 ng/mL  Uric acid  Result Value Ref Range   Uric Acid, Serum 3.3 2.4 - 7.0 mg/dL  Ferritin  Result Value Ref Range   Ferritin 37.5 10.0 - 291.0 ng/mL  Basic metabolic panel  Result Value Ref Range   Sodium 139 135 - 145 mEq/L   Potassium 4.4 3.5 - 5.1 mEq/L   Chloride 103 96 - 112 mEq/L   CO2 25 19 - 32 mEq/L   Glucose, Bld 94 70 - 99 mg/dL   BUN 20 6 - 23 mg/dL   Creatinine, Ser 0.87 0.40 - 1.20 mg/dL   Calcium 9.3 8.4 - 10.5 mg/dL   GFR 63.05 >60.00 mL/min

## 2018-11-27 ENCOUNTER — Other Ambulatory Visit: Payer: Self-pay | Admitting: Cardiology

## 2018-11-27 ENCOUNTER — Ambulatory Visit (INDEPENDENT_AMBULATORY_CARE_PROVIDER_SITE_OTHER): Payer: Medicare Other | Admitting: Family Medicine

## 2018-11-27 ENCOUNTER — Other Ambulatory Visit: Payer: Self-pay

## 2018-11-27 ENCOUNTER — Encounter: Payer: Self-pay | Admitting: Family Medicine

## 2018-11-27 VITALS — BP 124/84 | HR 54 | Temp 98.3°F | Resp 16 | Ht 62.0 in | Wt 121.0 lb

## 2018-11-27 DIAGNOSIS — M109 Gout, unspecified: Secondary | ICD-10-CM | POA: Diagnosis not present

## 2018-11-27 DIAGNOSIS — Z862 Personal history of diseases of the blood and blood-forming organs and certain disorders involving the immune mechanism: Secondary | ICD-10-CM | POA: Diagnosis not present

## 2018-11-27 DIAGNOSIS — R202 Paresthesia of skin: Secondary | ICD-10-CM

## 2018-11-27 DIAGNOSIS — M79645 Pain in left finger(s): Secondary | ICD-10-CM | POA: Diagnosis not present

## 2018-11-27 DIAGNOSIS — R200 Anesthesia of skin: Secondary | ICD-10-CM | POA: Diagnosis not present

## 2018-11-27 LAB — BASIC METABOLIC PANEL
BUN: 20 mg/dL (ref 6–23)
CO2: 25 mEq/L (ref 19–32)
Calcium: 9.3 mg/dL (ref 8.4–10.5)
Chloride: 103 mEq/L (ref 96–112)
Creatinine, Ser: 0.87 mg/dL (ref 0.40–1.20)
GFR: 63.05 mL/min (ref >60.00)
Glucose, Bld: 94 mg/dL (ref 70–99)
Potassium: 4.4 mEq/L (ref 3.5–5.1)
Sodium: 139 mEq/L (ref 135–145)

## 2018-11-27 LAB — B12 AND FOLATE PANEL
Folate: >23.3 ng/mL (ref >5.9)
Vitamin B-12: 1505 pg/mL — ABNORMAL HIGH (ref 211–911)

## 2018-11-27 LAB — URIC ACID: Uric Acid, Serum: 3.3 mg/dL (ref 2.4–7.0)

## 2018-11-27 LAB — FERRITIN: Ferritin: 37.5 ng/mL (ref 10.0–291.0)

## 2018-11-27 MED ORDER — PREDNISONE 20 MG PO TABS: ORAL_TABLET | ORAL | 0 refills | Status: DC

## 2018-11-27 NOTE — Patient Instructions (Addendum)
It was great to see you today-  I will be in touch with your labs asap Use the wrist brace at night while you are sleeping- I am hopeful that this will relieve the symptoms you are having in your hand We will also do labs today to check for any vitamin deficiency which might be contributing  You can use a second course of prednisone in case it might help your hand symptoms

## 2018-11-28 NOTE — Telephone Encounter (Signed)
Atorvastatin 40 mg refilled.

## 2018-12-01 ENCOUNTER — Encounter: Payer: Self-pay | Admitting: Family Medicine

## 2018-12-04 ENCOUNTER — Telehealth (INDEPENDENT_AMBULATORY_CARE_PROVIDER_SITE_OTHER): Payer: Medicare Other | Admitting: Cardiology

## 2018-12-04 VITALS — BP 190/86 | HR 65 | Ht 62.0 in | Wt 120.0 lb

## 2018-12-04 DIAGNOSIS — I350 Nonrheumatic aortic (valve) stenosis: Secondary | ICD-10-CM

## 2018-12-04 DIAGNOSIS — I2510 Atherosclerotic heart disease of native coronary artery without angina pectoris: Secondary | ICD-10-CM

## 2018-12-04 DIAGNOSIS — E7800 Pure hypercholesterolemia, unspecified: Secondary | ICD-10-CM

## 2018-12-04 DIAGNOSIS — I10 Essential (primary) hypertension: Secondary | ICD-10-CM

## 2018-12-04 NOTE — Patient Instructions (Signed)
Medication Instructions:  NO CHANGE If you need a refill on your cardiac medications before your next appointment, please call your pharmacy.   Lab work: If you have labs (blood work) drawn today and your tests are completely normal, you will receive your results only by: [name] MyChart Message (if you have MyChart) OR . A paper copy in the mail If you have any lab test that is abnormal or we need to change your treatment, we will call you to review the results.  Testing/Procedures: Your physician has requested that you have an echocardiogram. Echocardiography is a painless test that uses sound waves to create images of your heart. It provides your doctor with information about the size and shape of your heart and how well your heart's chambers and valves are working. This procedure takes approximately one hour. There are no restrictions for this procedure.  IN 6 MONTHS @ [address]=WE WILL CONTACT YOU  Follow-Up: At [facility], you and your health needs are our priority.  As part of our continuing mission to provide you with exceptional heart care, we have created designated Provider Care Teams.  These Care Teams include your primary Cardiologist (physician) and Advanced Practice Providers (APPs -  Physician Assistants and Nurse Practitioners) who all work together to provide you with the care you need, when you need it. You will need a follow up appointment in 6 months.  Please call our office 2 months in advance to schedule this appointment.  You may see  Kirk Ruths MD or one of the following Advanced Practice Providers on your designated Care Team:   Kerin Ransom, PA-C Roby Lofts, [place] . Sande Rives, PA-C

## 2018-12-04 NOTE — Progress Notes (Signed)
Virtual Visit via Video Note changed to phone visit as patient did not have a smart phone   This visit type was conducted due to national recommendations for restrictions regarding the COVID-19 Pandemic (e.g. social distancing) in an effort to limit this patient's exposure and mitigate transmission in our community.  Due to her co-morbid illnesses, this patient is at least at moderate risk for complications without adequate follow up.  This format is felt to be most appropriate for this patient at this time.  All issues noted in this document were discussed and addressed.  A limited physical exam was performed with this format.  Please refer to the patient's chart for her consent to telehealth for [facility].   Date:  12/04/2018   ID:  [name], DOB May 24, 1941, MRN [id number]  Patient Location: Home Provider Location: Home  PCP:  Darreld Mclean, MD  Cardiologist:  Dr [name]  Evaluation Performed:  Follow-Up Visit  Chief Complaint:  CAD  History of Present Illness:    FU coronary artery disease. She is status post PCI of her circumflex and right coronary artery with drug-eluting stents in August 2008. Abdominal ultrasound in January 2014 showed moderate right and severe left common iliac stenosis. ABIs in January of 2014 were normal. Patient seen by Dr Fletcher Anon and medical therapy recommended for now.Nuclear study 1/16 showed EF 68 and normal perfusion. Pt had probable stress induced CM 6/17 in setting of urosepsis.Also with GI bleed 7/17 related to gastic AVM; ablated.Carotid dopplers November 2019 showed 1 to 39% bilateral stenosis.  Echocardiogram November 2019 showed normal LV function, mild diastolic dysfunction, mild aortic stenosis with mean gradient 10 mmHg, moderate aortic insufficiency and mild tricuspid regurgitation.  No mass in the right atrium.  Since I last saw her, patient denies dyspnea, chest pain, palpitations or syncope.  No pedal edema.  The patient does  not have symptoms concerning for COVID-19 infection (fever, chills, cough, or new shortness of breath).    Past Medical History:  Diagnosis Date  . Arthritis   . CAD (coronary artery disease)    a. 02/2007 s/p PCI/DES ot LCX and RCA;  b. 07/2014 MV: no ischemia/infarct, EF 68%.  . Carotid arterial disease ([place])    a. 05/2015 Carotid U/S: RICA 9-67%, LICA 89-38%, RECA 1-01%, LECA >50% - overall stable, f/u 1 yr.  . Complication of anesthesia    Three days after procedure pt statrted to suffer with memoy loss that took about 4-5 days to come back according to [name]."  . Dilated cardiomyopathy ([place])    a. 05/2005 Echo: EF 65%; b. 01/22/2016 Echo: EF 25-30% (in setting of admission for urosepsis w/ elev trop); c. 01/26/2016 Echo: EF 35-40%, inf, distal apical, apical, and mid to distal septal HK-->felt to be 2/2 stress induced CM.  [name] Heart murmur   . History of bronchitis   . Hyperlipidemia   . Hypertension   . Hypertensive heart disease   . Memory impairment   . MRSA (methicillin resistant staph aureus) culture positive   . Nephrolithiasis    a. 12/2015 UVJ stone w/ hydroureteronephrosis req nephrostomy tube placement.  . Osteoarthritis    a. 01/5101 s/p L TKA complicated by infections req skin grafting.  [name] PVD (peripheral vascular disease) ([place])    a. Bilat common iliac dzs, nl ABI's --> med rx.  . Sepsis ([place])    a. 12/2015 admitted with Proteus mirabilis urosepsis/bacteremia.  . Wears glasses    Past Surgical  History:  Procedure Laterality Date  . ABDOMINAL HYSTERECTOMY    . APPLICATION OF A-CELL OF EXTREMITY Left 12/08/2015   Procedure: APPLICATION OF A-CELL OF knee;  Surgeon: Loel Lofty [name], DO;  Location: [place];  Service: Plastics;  Laterality: Left;  . APPLICATION OF WOUND VAC Left 11/16/2015   Procedure: APPLICATION OF WOUND VAC;  Surgeon: Loel Lofty [name], DO;  Location: [place];  Service: Plastics;  Laterality: Left;  . APPLICATION OF WOUND VAC Left 12/08/2015    Procedure: APPLICATION OF WOUND VAC  AND CAST to knee;  Surgeon: Loel Lofty [name], DO;  Location: [place];  Service: Plastics;  Laterality: Left;  . COLONOSCOPY    . CORONARY ANGIOPLASTY  07   3 stents placed  . ESOPHAGOGASTRODUODENOSCOPY    . EYE SURGERY Bilateral    cataracts  . FREE FLAP TO EXTREMITY Left 11/16/2015   Procedure: FREE FLAP TO EXTREMITY;  Surgeon: Loel Lofty [name], DO;  Location: [place];  Service: Plastics;  Laterality: Left;  . I&D EXTREMITY Left 10/18/2015   Procedure: Left Knee Washout, Reclosure;  Surgeon: Newt Minion, MD;  Location: [place];  Service: Orthopedics;  Laterality: Left;  . I&D EXTREMITY Left 12/08/2015   Procedure: IRRIGATION AND DEBRIDEMENT knee;  Surgeon: Loel Lofty [name], DO;  Location: [place];  Service: Plastics;  Laterality: Left;  . I&D KNEE WITH POLY EXCHANGE Left 10/12/2015   Procedure: Irrigation and Debridement , Poly Exchange, Antibiotic Beads Left Knee;  Surgeon: Newt Minion, MD;  Location: [place];  Service: Orthopedics;  Laterality: Left;  . INCISION AND DRAINAGE OF WOUND Left 01/04/2016   Procedure: DEBRIDEMENT OF LEFT KNEE WOUND WITH ACELL AND WOUND VAC PLACEMENT;  Surgeon: Loel Lofty [name], DO;  Location: [place];  Service: Plastics;  Laterality: Left;  . IR GENERIC HISTORICAL  05/11/2016   IR FLUORO GUIDE CV MIDLINE PICC RIGHT 05/11/2016 Sandi Mariscal, MD [facility]  . IR GENERIC HISTORICAL  05/11/2016   IR US GUIDE VASC ACCESS RIGHT 05/11/2016 Sandi Mariscal, MD [facility]  . KNEE CLOSED REDUCTION Left 01/16/2017   Procedure: CLOSED MANIPULATION LEFT KNEE;  Surgeon: Leandrew Koyanagi, MD;  Location: [place];  Service: Orthopedics;  Laterality: Left;  . KNEE SURGERY     denies  . SKIN SPLIT GRAFT Left 11/16/2015   Procedure: LEFT GASTROC MUSCLE FLAP WITH SKIN GRAFT SPLIT THICKNESS AND VAC PLACEMENT;  Surgeon: Loel Lofty [name], DO;  Location: [place];  Service: Plastics;  Laterality: Left;  . TOTAL HIP ARTHROPLASTY Right  09/10/2012   Dr Sharol Given  . TOTAL HIP ARTHROPLASTY Right 09/10/2012   Procedure: TOTAL HIP ARTHROPLASTY;  Surgeon: Newt Minion, MD;  Location: [place];  Service: Orthopedics;  Laterality: Right;  Right Total Hip Arthroplasty  . TOTAL HIP ARTHROPLASTY Left 06/27/2016   Procedure: LEFT TOTAL HIP ARTHROPLASTY ANTERIOR APPROACH;  Surgeon: Leandrew Koyanagi, MD;  Location: [place];  Service: Orthopedics;  Laterality: Left;  . TOTAL KNEE ARTHROPLASTY Left 09/28/2015   Procedure: TOTAL KNEE ARTHROPLASTY;  Surgeon: Newt Minion, MD;  Location: [place];  Service: Orthopedics;  Laterality: Left;     Current Meds  Medication Sig  . amLODipine (NORVASC) 10 MG tablet Take 1 tablet (10 mg total) by mouth daily.  [name] amLODipine (NORVASC) 5 MG tablet TAKE ONE TABLET BY MOUTH DAILY  . aspirin EC 81 MG tablet Take 81 mg by mouth daily.  [name] atorvastatin (LIPITOR) 40 MG tablet TAKE ONE TABLET BY MOUTH  DAILY  . Calcium Carbonate-Vit D-Min (CALCIUM 1200 PO) Take 1 tablet by mouth daily.   . carvedilol (COREG) 12.5 MG tablet TAKE ONE TABLET BY MOUTH TWICE A DAY  . doxycycline (VIBRA-TABS) 100 MG tablet Take 100 mg by mouth 2 (two) times daily.  . furosemide (LASIX) 20 MG tablet TAKE ONE TABLET BY MOUTH DAILY  . Magnesium Oxide (MAG-OXIDE) 200 MG TABS Take 200 mg by mouth every morning.   . Multiple Vitamin (MULITIVITAMIN WITH MINERALS) TABS Take 1 tablet by mouth daily.  . naproxen sodium (ALEVE) 220 MG tablet Take 220 mg by mouth as needed (pain).   [name] oxyCODONE (OXY IR/ROXICODONE) 5 MG immediate release tablet Take 1 tablet (5 mg total) by mouth every 4 (four) hours as needed for breakthrough pain.  . potassium chloride SA (K-DUR,KLOR-CON) 20 MEQ tablet TAKE ONE TABLET BY MOUTH DAILY (Patient taking differently: Take 20 mEq by mouth daily. )  . predniSONE (DELTASONE) 20 MG tablet Take 2 pills (40mg ) for 3 days, then 20 mg for 4 days for hand pain  . Probiotic Product (PROBIOTIC DAILY PO) Take 1 capsule by mouth daily.  .  rifampin (RIFADIN) 300 MG capsule Take 300 mg by mouth 2 (two) times daily.     Allergies:   Patient has no known allergies.   Social History   Tobacco Use  . Smoking status: Former Smoker    Packs/day: 0.50    Years: 50.00    Pack years: 25.00    Types: Cigarettes    Last attempt to quit: 09/28/2015    Years since quitting: 3.1  . Smokeless tobacco: Never Used  . Tobacco comment: quit 09/27/15  Substance Use Topics  . Alcohol use: No  . Drug use: No     Family Hx: The patient's family history includes Bone cancer in an other family member; Breast cancer in her mother; Leukemia in her father; Stroke in her sister.  ROS:   Please see the history of present illness.    No fevers, chills or productive cough.  Continued pelvic pain from prior fracture. All other systems reviewed and are negative.   Recent Labs: 07/23/2018: ALT 15; Hemoglobin 12.9; Platelets 177 11/27/2018: BUN 20; Creatinine, Ser 0.87; Potassium 4.4; Sodium 139   Recent Lipid Panel Lab Results  Component Value Date/Time   CHOL 153 06/09/2018 10:57 AM   TRIG 116 06/09/2018 10:57 AM   HDL 63 06/09/2018 10:57 AM   CHOLHDL 2.4 06/09/2018 10:57 AM   CHOLHDL 3 01/09/2012 09:34 AM   LDLCALC 67 06/09/2018 10:57 AM    Wt Readings from Last 3 Encounters:  12/04/18 120 lb (54.4 kg)  11/27/18 121 lb (54.9 kg)  10/01/18 11 lb (4.99 kg)     Objective:    Vital Signs:  BP (!) 190/86   Pulse 65   Ht 5\' 2"  (1.575 m)   Wt 120 lb (54.4 kg)   BMI 21.95 kg/m    VITAL SIGNS:  reviewed  No acute distress Answers questions appropriately Normal affect Remainder physical examination not performed (telehealth visit; coronavirus pandemic)  ASSESSMENT & PLAN:    1. Coronary artery disease-patient doing well from a symptomatic standpoint.  Continue medical therapy with aspirin and statin. 2. Hypertension-patient's blood pressure is elevated; however she was recently discharged from rehabilitation following pelvic  fracture and states her systolic was 660 there.  She thinks her cuff may be inaccurate.  Continue present medications and follow blood pressure.  May need to correlate patient's cuff  with another to make sure of accuracy. 3. Hyperlipidemia-continue statin. 4. History of stress-induced cardiomyopathy-LV function has normalized on most recent echocardiogram. 5. Mild aortic stenosis/moderate aortic insufficiency-patient will need follow-up echocardiograms November 2020. 6. Carotid artery disease-mild on most recent Dopplers. 7. Peripheral vascular disease-patient denies claudication.  Continue aspirin and statin.  COVID-19 Education: The importance of social distancing was discussed today.  Time:   Today, I have spent 10 minutes with the patient with telehealth technology discussing the above problems.     Medication Adjustments/Labs and Tests Ordered: Current medicines are reviewed at length with the patient today.  Concerns regarding medicines are outlined above.   Tests Ordered: No orders of the defined types were placed in this encounter.   Medication Changes: No orders of the defined types were placed in this encounter.   Disposition:  Follow up in 6 month(s)  Signed, Kirk Ruths, MD  12/04/2018 2:27 PM    [facility]

## 2018-12-11 ENCOUNTER — Telehealth: Payer: Medicare Other | Admitting: Cardiology

## 2018-12-29 ENCOUNTER — Other Ambulatory Visit: Payer: Self-pay | Admitting: Internal Medicine

## 2018-12-31 ENCOUNTER — Ambulatory Visit: Payer: Medicare Other | Admitting: Family Medicine

## 2019-01-06 NOTE — Progress Notes (Addendum)
[facility] [address], [place], [place] [address] [phone]  Date:  01/08/2019   Name:  [name]   DOB:  1941/01/18   MRN:  [id number]  PCP:  Darreld Mclean, MD    Chief Complaint: Hypertension (follow up), COPD, and Foot Swelling (right foot swelling, no injruy, one week)   History of Present Illness:  [name] is a 78 y.o. very pleasant female patient who presents with the following:  Periodic follow-up visit today She has history of CAD status post stents x3.  In late 2017 she had a left total knee, complicated by MRSA infection.  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  She is using chronic doxycycline and rifampin  She did a virtual visit with her cardiologist last month, Dr. [name] 1. Coronary artery disease-patient doing well from a symptomatic standpoint.  Continue medical therapy with aspirin and statin. 2. Hypertension-patient's blood pressure is elevated; however she was recently discharged from rehabilitation following pelvic fracture and states her systolic was 299 there.  She thinks her cuff may be inaccurate.  Continue present medications and follow blood pressure.  May need to correlate patient's cuff with another to make sure of accuracy. 3. Hyperlipidemia-continue statin. 4. History of stress-induced cardiomyopathy-LV function has normalized on most recent echocardiogram. 5. Mild aortic stenosis/moderate aortic insufficiency-patient will need follow-up echocardiograms November 2020. 6. Carotid artery  disease-mild on most recent Dopplers. 7. Peripheral vascular disease-patient denies claudication.  Continue aspirin and statin.  Her left knee is about at baseline Her right knee is also painful- this is not new New issue today-  her right foot and ankle have been swollen for the last 10 days She has not had this sort of swelling in the past It is painful, she notes a sore in between the toes of the right foot. No injury that she is aware of otherwise  No fever noted Otherwise she feels ok today She cannot walk long distances and uses a WC when needed  Patient Active Problem List   Diagnosis Date Noted  . Closed fracture of multiple pubic rami ([place]) 07/22/2018  . Closed fracture of right superior rim of pubis ([place])   . Lateral dislocation of left patella 01/15/2017  . Unilateral primary osteoarthritis, right knee 01/15/2017  . Osteoporosis 11/26/2016  . Acute pain of left knee 08/27/2016  . Chronic diarrhea 08/07/2016  . Left displaced femoral neck fracture ([place]) 06/26/2016  . MRSA (methicillin resistant Staphylococcus aureus) infection 06/26/2016  . Hypertensive heart disease   . Hydronephrosis   . Obstructive uropathy   . Abnormal echocardiogram 01/23/2016  . Pressure ulcer 01/21/2016  . Chronic infection of prosthetic knee ([place]) 10/12/2015  . AAA 01/19/2009  . ILIAC ARTERY ANEURYSM 01/19/2009  . Hyperlipidemia 01/14/2009  . TOBACCO ABUSE 01/14/2009  . Essential hypertension 01/14/2009  . Coronary atherosclerosis 01/14/2009  . Cerebrovascular disease 01/14/2009  . Peripheral vascular disease ([place]) 01/14/2009  . CHRONIC OBSTRUCTIVE PULMONARY DISEASE 01/14/2009    Past Medical History:  Diagnosis Date  . Arthritis   . CAD (coronary artery  disease)    a. 02/2007 s/p PCI/DES ot LCX and RCA;  b. 07/2014 MV: no ischemia/infarct, EF 68%.  . Carotid arterial disease ([place])    a. 05/2015 Carotid U/S: RICA 2-70%, LICA 35-00%, RECA 9-38%, LECA >50% - overall stable, f/u 1 yr.  .  Complication of anesthesia    Three days after procedure pt statrted to suffer with memoy loss that took about 4-5 days to come back according to [name]."  . Dilated cardiomyopathy ([place])    a. 05/2005 Echo: EF 65%; b. 01/22/2016 Echo: EF 25-30% (in setting of admission for urosepsis w/ elev trop); c. 01/26/2016 Echo: EF 35-40%, inf, distal apical, apical, and mid to distal septal HK-->felt to be 2/2 stress induced CM.  [name] Heart murmur   . History of bronchitis   . Hyperlipidemia   . Hypertension   . Hypertensive heart disease   . Memory impairment   . MRSA (methicillin resistant staph aureus) culture positive   . Nephrolithiasis    a. 12/2015 UVJ stone w/ hydroureteronephrosis req nephrostomy tube placement.  . Osteoarthritis    a. 07/8297 s/p L TKA complicated by infections req skin grafting.  [name] PVD (peripheral vascular disease) ([place])    a. Bilat common iliac dzs, nl ABI's --> med rx.  . Sepsis ([place])    a. 12/2015 admitted with Proteus mirabilis urosepsis/bacteremia.  . Wears glasses     Past Surgical History:  Procedure Laterality Date  . ABDOMINAL HYSTERECTOMY    . APPLICATION OF A-CELL OF EXTREMITY Left 12/08/2015   Procedure: APPLICATION OF A-CELL OF knee;  Surgeon: Loel Lofty [name], DO;  Location: [facility];  Service: Plastics;  Laterality: Left;  . APPLICATION OF WOUND VAC Left 11/16/2015   Procedure: APPLICATION OF WOUND VAC;  Surgeon: Loel Lofty [name], DO;  Location: [facility];  Service: Plastics;  Laterality: Left;  . APPLICATION OF WOUND VAC Left 12/08/2015   Procedure: APPLICATION OF WOUND VAC  AND CAST to knee;  Surgeon: Loel Lofty [name], DO;  Location: [facility];  Service: Plastics;  Laterality: Left;  . COLONOSCOPY    . CORONARY ANGIOPLASTY  07   3 stents placed  . ESOPHAGOGASTRODUODENOSCOPY    . EYE SURGERY Bilateral    cataracts  . FREE FLAP TO EXTREMITY Left 11/16/2015   Procedure: FREE FLAP TO EXTREMITY;  Surgeon: Loel Lofty [name], DO;  Location: [facility];  Service:  Plastics;  Laterality: Left;  . I&D EXTREMITY Left 10/18/2015   Procedure: Left Knee Washout, Reclosure;  Surgeon: Newt Minion, MD;  Location: [facility];  Service: Orthopedics;  Laterality: Left;  . I&D EXTREMITY Left 12/08/2015   Procedure: IRRIGATION AND DEBRIDEMENT knee;  Surgeon: Loel Lofty [name], DO;  Location: [facility];  Service: Plastics;  Laterality: Left;  . I&D KNEE WITH POLY EXCHANGE Left 10/12/2015   Procedure: Irrigation and Debridement , Poly Exchange, Antibiotic Beads Left Knee;  Surgeon: Newt Minion, MD;  Location: [facility];  Service: Orthopedics;  Laterality: Left;  . INCISION AND DRAINAGE OF WOUND Left 01/04/2016   Procedure: DEBRIDEMENT OF LEFT KNEE WOUND WITH ACELL AND WOUND VAC PLACEMENT;  Surgeon: Loel Lofty [name], DO;  Location: [facility];  Service: Plastics;  Laterality: Left;  . IR GENERIC HISTORICAL  05/11/2016   IR FLUORO GUIDE CV MIDLINE PICC RIGHT 05/11/2016 Sandi Mariscal, MD [facility]  . IR GENERIC HISTORICAL  05/11/2016   IR US GUIDE VASC ACCESS RIGHT 05/11/2016 Sandi Mariscal, MD [facility]  . KNEE CLOSED  REDUCTION Left 01/16/2017   Procedure: CLOSED MANIPULATION LEFT KNEE;  Surgeon: Leandrew Koyanagi, MD;  Location: [facility];  Service: Orthopedics;  Laterality: Left;  . KNEE SURGERY     denies  . SKIN SPLIT GRAFT Left 11/16/2015   Procedure: LEFT GASTROC MUSCLE FLAP WITH SKIN GRAFT SPLIT THICKNESS AND VAC PLACEMENT;  Surgeon: Loel Lofty [name], DO;  Location: [facility];  Service: Plastics;  Laterality: Left;  . TOTAL HIP ARTHROPLASTY Right 09/10/2012   Dr Sharol Given  . TOTAL HIP ARTHROPLASTY Right 09/10/2012   Procedure: TOTAL HIP ARTHROPLASTY;  Surgeon: Newt Minion, MD;  Location: [facility];  Service: Orthopedics;  Laterality: Right;  Right Total Hip Arthroplasty  . TOTAL HIP ARTHROPLASTY Left 06/27/2016   Procedure: LEFT TOTAL HIP ARTHROPLASTY ANTERIOR APPROACH;  Surgeon: Leandrew Koyanagi, MD;  Location: [facility];  Service: Orthopedics;  Laterality: Left;  . TOTAL KNEE  ARTHROPLASTY Left 09/28/2015   Procedure: TOTAL KNEE ARTHROPLASTY;  Surgeon: Newt Minion, MD;  Location: [facility];  Service: Orthopedics;  Laterality: Left;    Social History   Tobacco Use  . Smoking status: Former Smoker    Packs/day: 0.50    Years: 50.00    Pack years: 25.00    Types: Cigarettes    Quit date: 09/28/2015    Years since quitting: 3.2  . Smokeless tobacco: Never Used  . Tobacco comment: quit 09/27/15  Substance Use Topics  . Alcohol use: No  . Drug use: No    Family History  Problem Relation Age of Onset  . Breast cancer Mother   . Leukemia Father   . Stroke Sister   . Bone cancer Other     No Known Allergies  Medication list has been reviewed and updated.  Current Outpatient Medications on File Prior to Visit  Medication Sig Dispense Refill  . amLODipine (NORVASC) 10 MG tablet Take 1 tablet (10 mg total) by mouth daily.    [name] amLODipine (NORVASC) 5 MG tablet TAKE ONE TABLET BY MOUTH DAILY 90 tablet 4  . aspirin EC 81 MG tablet Take 81 mg by mouth daily.    [name] atorvastatin (LIPITOR) 40 MG tablet TAKE ONE TABLET BY MOUTH DAILY 90 tablet 0  . Calcium Carbonate-Vit D-Min (CALCIUM 1200 PO) Take 1 tablet by mouth daily.     . carvedilol (COREG) 12.5 MG tablet TAKE ONE TABLET BY MOUTH TWICE A DAY 180 tablet 4  . doxycycline (VIBRA-TABS) 100 MG tablet TAKE ONE TABLET BY MOUTH TWICE A DAY 60 tablet 10  . furosemide (LASIX) 20 MG tablet TAKE ONE TABLET BY MOUTH DAILY 90 tablet 4  . Magnesium Oxide (MAG-OXIDE) 200 MG TABS Take 200 mg by mouth every morning.     . Multiple Vitamin (MULITIVITAMIN WITH MINERALS) TABS Take 1 tablet by mouth daily.    . naproxen sodium (ALEVE) 220 MG tablet Take 220 mg by mouth as needed (pain).     [name] oxyCODONE (OXY IR/ROXICODONE) 5 MG immediate release tablet Take 1 tablet (5 mg total) by mouth every 4 (four) hours as needed for breakthrough pain. 20 tablet 0  . potassium chloride SA (K-DUR,KLOR-CON) 20 MEQ tablet TAKE ONE TABLET BY MOUTH  DAILY (Patient taking differently: Take 20 mEq by mouth daily. ) 90 tablet 3  . predniSONE (DELTASONE) 20 MG tablet Take 2 pills (40mg ) for 3 days, then 20 mg for 4 days for hand pain 10 tablet 0  . Probiotic Product (PROBIOTIC DAILY PO) Take 1 capsule  by mouth daily.    . rifampin (RIFADIN) 300 MG capsule Take 300 mg by mouth 2 (two) times daily.    [name] lisinopril (PRINIVIL,ZESTRIL) 40 MG tablet Take 1 tablet (40 mg total) by mouth daily. 90 tablet 3  . mesalamine (LIALDA) 1.2 g EC tablet Take 2.4 g by mouth daily with breakfast.     No current facility-administered medications on file prior to visit.     Review of Systems:  As per HPI- otherwise negative. No fever or chills  Physical Examination: Vitals:   01/08/19 1118  BP: (!) 152/80  Pulse: 72  Resp: 16  Temp: 97.8 F (36.6 C)  SpO2: 96%   Vitals:   01/08/19 1118  Weight: 122 lb (55.3 kg)  Height: 5\' 2"  (1.575 m)   Body mass index is 22.31 kg/m. Ideal Body Weight: Weight in (lb) to have BMI = 25: 136.4  GEN: WDWN, NAD, Non-toxic, A & O x 3, appears her normal self HEENT: Atraumatic, Normocephalic. Neck supple. No masses, No LAD. Ears and Nose: No external deformity. CV: RRR, No M/G/R. No JVD. No thrill. No extra heart sounds. PULM: CTA B, no wheezes, crackles, rhonchi. No retractions. No resp. distress. No accessory muscle use. ABD: S, NT, ND, +BS. No rebound. No HSM. EXTR: No c/c/e NEURO using wheelchair, as it is a long walk back to my office PSYCH: Normally interactive. Conversant. Not depressed or anxious appearing.  Calm demeanor.  She has mild puffiness of the RIGHT ankle, lower calf, and dorsum of the foot. There is an ulcerated wound on the lateral aspect of the right great toe.  This seems to have occurred from where the IP joint of the second toe is rubbing.  She has dressed the area with some gauze and tape I gently debrided the wound and removed dead skin, and placed a gel dressing  Assessment and  Plan: Localized swelling of right lower leg - Plan: US Venous Img Lower Unilateral Right  Open wound of right great toe, initial encounter - Plan: DG Foot Complete Right, Td vaccine greater than or equal to 7yo preservative free IM  Here today with concern of pain and swelling of the right foot and leg.  Symptoms may be due to the sore on her toe.  However, the swelling is more than I would expect for this small wound so would also like to rule out a DVT.  Will obtain a plain film and also ultrasound  Supplied her with some extra gel dressings and explained how to apply the gel dressing to big toe so it can heal Due for tetanus shot, updated this today Await her imaging studies and will be in touch with her If toe is not improving soon may want to add keflex  Follow-up: No follow-ups on file.  No orders of the defined types were placed in this encounter.  Orders Placed This Encounter  Procedures  . DG Foot Complete Right  . US Venous Img Lower Unilateral Right  . Td vaccine greater than or equal to 7yo preservative free IM    Signed Lamar Blinks, MD  Dg Foot Complete Right  Result Date: 01/08/2019 CLINICAL DATA:  Right foot pain and swelling for the past week. Great toe wound. EXAM: RIGHT FOOT COMPLETE - 3+ VIEW COMPARISON:  None. FINDINGS: No acute fracture or dislocation. No osseous destruction or periosteal reaction. Mild hallux valgus deformity with associated mild first MTP joint degenerative changes. Diffuse osteopenia. Large plantar and Achilles enthesophytes. Soft  tissue swelling medial to the first metatarsal head. IMPRESSION: 1. Mild hallux valgus deformity with soft tissue swelling medial to the first metatarsal head. No radiographic evidence of osteomyelitis. Electronically Signed   By: Titus Dubin M.D.   On: 01/08/2019 16:56    addnd 6/12 Received her right extremity Doppler, it is negative as below.  Gave her a call, she is glad to hear that there is no clot.  She  notes that her wound and leg swelling actually seems better today.  She will continue take doxycycline.  She requests a refill of prednisone for joint pain in her hand, I suggested that we try Voltaren gel instead as this would be safer for her.  She is willing to give it a try  I advised her of the need to watch the wound on her foot carefully.  If it is resolving that is fine, but if any question I need to look at again next week.  She states understanding will keep me posted  EXAM: RIGHT LOWER EXTREMITY VENOUS DOPPLER ULTRASOUND TECHNIQUE: Gray-scale sonography with graded compression, as well as color Doppler and duplex ultrasound were performed to evaluate the lower extremity deep venous systems from the level of the common femoral vein and including the common femoral, femoral, profunda femoral, popliteal and calf veins including the posterior tibial, peroneal and gastrocnemius veins when visible. The superficial great saphenous vein was also interrogated. Spectral Doppler was utilized to evaluate flow at rest and with distal augmentation maneuvers in the common femoral, femoral and popliteal veins.  COMPARISON:  None. FINDINGS: Contralateral Common Femoral Vein: Respiratory phasicity is normal and symmetric with the symptomatic side. No evidence of thrombus. Normal compressibility.  Common Femoral Vein: No evidence of thrombus. Normal compressibility, respiratory phasicity and response to augmentation.  Saphenofemoral Junction: No evidence of thrombus. Normal compressibility and flow on color Doppler imaging.  Profunda Femoral Vein: No evidence of thrombus. Normal compressibility and flow on color Doppler imaging.  Femoral Vein: No evidence of thrombus. Normal compressibility, respiratory phasicity and response to augmentation.  Popliteal Vein: No evidence of thrombus. Normal compressibility, respiratory phasicity and response to augmentation.  Calf Veins: No  evidence of thrombus. Normal compressibility and flow on color Doppler imaging.  Superficial Great Saphenous Vein: No evidence of thrombus. Normal compressibility. Venous Reflux:  None. Other Findings:  None. IMPRESSION: No evidence of deep venous thrombosis.

## 2019-01-08 ENCOUNTER — Other Ambulatory Visit (HOSPITAL_BASED_OUTPATIENT_CLINIC_OR_DEPARTMENT_OTHER): Payer: Medicare Other

## 2019-01-08 ENCOUNTER — Ambulatory Visit (HOSPITAL_BASED_OUTPATIENT_CLINIC_OR_DEPARTMENT_OTHER)
Admission: RE | Admit: 2019-01-08 | Discharge: 2019-01-08 | Disposition: A | Discharge status: Home / Self Care | Payer: Medicare Other | Source: Ambulatory Visit | Attending: Family Medicine | Admitting: Family Medicine

## 2019-01-08 ENCOUNTER — Ambulatory Visit (INDEPENDENT_AMBULATORY_CARE_PROVIDER_SITE_OTHER): Payer: Medicare Other | Admitting: Family Medicine

## 2019-01-08 ENCOUNTER — Other Ambulatory Visit: Payer: Self-pay

## 2019-01-08 ENCOUNTER — Encounter: Payer: Self-pay | Admitting: Family Medicine

## 2019-01-08 VITALS — BP 152/80 | HR 72 | Temp 97.8°F | Resp 16 | Ht 62.0 in | Wt 122.0 lb

## 2019-01-08 DIAGNOSIS — M2011 Hallux valgus (acquired), right foot: Secondary | ICD-10-CM | POA: Diagnosis not present

## 2019-01-08 DIAGNOSIS — R2241 Localized swelling, mass and lump, right lower limb: Secondary | ICD-10-CM | POA: Diagnosis not present

## 2019-01-08 DIAGNOSIS — I2510 Atherosclerotic heart disease of native coronary artery without angina pectoris: Secondary | ICD-10-CM

## 2019-01-08 DIAGNOSIS — M79645 Pain in left finger(s): Secondary | ICD-10-CM

## 2019-01-08 DIAGNOSIS — S91101A Unspecified open wound of right great toe without damage to nail, initial encounter: Secondary | ICD-10-CM | POA: Insufficient documentation

## 2019-01-08 DIAGNOSIS — Z23 Encounter for immunization: Secondary | ICD-10-CM | POA: Diagnosis not present

## 2019-01-08 NOTE — Patient Instructions (Signed)
It was good to see you today  I am not sure if the swelling in your leg is just due to the little wound on your foot, or if it could be something else.  We are going to get an ultrasound of your leg and also an x-ray of your foot today.  I will be in touch of these reports ASAP You got a tetanus vaccine today Please use the gel dressings that I gave you for the ulcer on your toe.  This will cover the wound and protect it so that it can heal.  You can replace the dressing as needed, when it gets dirty or starts to peel off

## 2019-01-09 ENCOUNTER — Ambulatory Visit (HOSPITAL_BASED_OUTPATIENT_CLINIC_OR_DEPARTMENT_OTHER)
Admission: RE | Admit: 2019-01-09 | Discharge: 2019-01-09 | Disposition: A | Discharge status: Home / Self Care | Payer: Medicare Other | Source: Ambulatory Visit | Attending: Family Medicine | Admitting: Family Medicine

## 2019-01-09 DIAGNOSIS — R2241 Localized swelling, mass and lump, right lower limb: Secondary | ICD-10-CM | POA: Diagnosis not present

## 2019-01-09 DIAGNOSIS — M7989 Other specified soft tissue disorders: Secondary | ICD-10-CM | POA: Diagnosis not present

## 2019-01-09 MED ORDER — DICLOFENAC SODIUM 1 % TD GEL: 2.0000 g | Freq: Four times a day (QID) | TRANSDERMAL | 3 refills | Status: DC

## 2019-01-09 NOTE — Addendum Note (Signed)
Addended by: Lamar Blinks C on: 01/09/2019 12:40 PM   Modules accepted: Orders

## 2019-01-28 ENCOUNTER — Ambulatory Visit: Payer: Medicare Other | Admitting: Orthopaedic Surgery

## 2019-02-04 ENCOUNTER — Telehealth: Payer: Self-pay

## 2019-02-04 ENCOUNTER — Other Ambulatory Visit: Payer: Self-pay

## 2019-02-04 ENCOUNTER — Encounter: Payer: Self-pay | Admitting: Orthopaedic Surgery

## 2019-02-04 ENCOUNTER — Ambulatory Visit (INDEPENDENT_AMBULATORY_CARE_PROVIDER_SITE_OTHER): Payer: Medicare Other | Admitting: Orthopaedic Surgery

## 2019-02-04 DIAGNOSIS — M1711 Unilateral primary osteoarthritis, right knee: Secondary | ICD-10-CM

## 2019-02-04 DIAGNOSIS — M25561 Pain in right knee: Secondary | ICD-10-CM

## 2019-02-04 DIAGNOSIS — G8929 Other chronic pain: Secondary | ICD-10-CM

## 2019-02-04 DIAGNOSIS — I2510 Atherosclerotic heart disease of native coronary artery without angina pectoris: Secondary | ICD-10-CM | POA: Diagnosis not present

## 2019-02-04 MED ORDER — METHYLPREDNISOLONE ACETATE 40 MG/ML IJ SUSP
40.0000 mg | INTRAMUSCULAR | Status: AC | PRN
  Administered 2019-02-04: 40 mg via INTRA_ARTICULAR

## 2019-02-04 MED ORDER — LIDOCAINE HCL 1 % IJ SOLN
3.0000 mL | INTRAMUSCULAR | Status: AC | PRN
  Administered 2019-02-04: 3 mL

## 2019-02-04 NOTE — Progress Notes (Signed)
Office Visit Note   Patient: [name]           Date of Birth: [birth year]           MRN: [id number] Visit Date: 02/04/2019              Requested by: Darreld Mclean, MD [address] [place],  [place] [address] PCP: Darreld Mclean, MD   Assessment & Plan: Visit Diagnoses:  1. Unilateral primary osteoarthritis, right knee   2. Chronic pain of right knee     Plan: Per her wishes I did provide a steroid injection in her right knee today.  We will order hyaluronic acid to place in her knee in 4 weeks from now.  Her son is with her today as well so he can help communicate with her.  All question concerns were answered and addressed.  Follow-Up Instructions: Return in about 4 weeks (around 03/04/2019), or if symptoms worsen or fail to improve.   Orders:  Orders Placed This Encounter  Procedures  . Large Joint Inj   No orders of the defined types were placed in this encounter.     Procedures: Large Joint Inj: R knee on 02/04/2019 1:22 PM Indications: diagnostic evaluation and pain Details: 22 G 1.5 in needle, superolateral approach  Arthrogram: No  Medications: 3 mL lidocaine 1 %; 40 mg methylPREDNISolone acetate 40 MG/ML Outcome: tolerated well, no immediate complications Procedure, treatment alternatives, risks and benefits explained, specific risks discussed. Consent was given by the patient. Immediately prior to procedure a time out was called to verify the correct patient, procedure, equipment, support staff and site/side marked as required. Patient was prepped and draped in the usual sterile fashion.       Clinical Data: No additional findings.   Subjective: Chief Complaint  Patient presents with  . Right Knee - Pain, Follow-up  The patient comes in today for continued evaluation treatment of severe right knee pain with known osteoarthritis.  She last had a steroid injection in her right knee about 5 months ago.  She is requesting 1  again today.  She would also like to consider hyaluronic acid at some point given the fact that the steroid injections are helping as much.  She has had no other acute change in her medical status.  She is mainly in a wheelchair and does ambulate on occasion.  HPI  Review of Systems She currently denies any headache, chest pain, shortness of breath, fever, chills, nausea, vomiting  Objective: Vital Signs: There were no vitals taken for this visit.  Physical Exam She is alert and orient x3 and in no acute distress Ortho Exam Examination of her right knee shows that it is painful throughout the arc of motion.  There is some slight valgus malalignment.  There is global tenderness. Specialty Comments:  No specialty comments available.  Imaging: No results found.   PMFS History: Patient Active Problem List   Diagnosis Date Noted  . Closed fracture of multiple pubic rami ([place]) 07/22/2018  . Closed fracture of right superior rim of pubis ([place])   . Lateral dislocation of left patella 01/15/2017  . Unilateral primary osteoarthritis, right knee 01/15/2017  . Osteoporosis 11/26/2016  . Acute pain of left knee 08/27/2016  . Chronic diarrhea 08/07/2016  . Left displaced femoral neck fracture ([place]) 06/26/2016  . MRSA (methicillin resistant Staphylococcus aureus) infection 06/26/2016  . Hypertensive heart disease   . Hydronephrosis   . Obstructive  uropathy   . Abnormal echocardiogram 01/23/2016  . Pressure ulcer 01/21/2016  . Chronic infection of prosthetic knee ([place]) 10/12/2015  . AAA 01/19/2009  . ILIAC ARTERY ANEURYSM 01/19/2009  . Hyperlipidemia 01/14/2009  . TOBACCO ABUSE 01/14/2009  . Essential hypertension 01/14/2009  . Coronary atherosclerosis 01/14/2009  . Cerebrovascular disease 01/14/2009  . Peripheral vascular disease ([place]) 01/14/2009  . CHRONIC OBSTRUCTIVE PULMONARY DISEASE 01/14/2009   Past Medical History:  Diagnosis Date  . Arthritis   . CAD (coronary artery  disease)    a. 02/2007 s/p PCI/DES ot LCX and RCA;  b. 07/2014 MV: no ischemia/infarct, EF 68%.  . Carotid arterial disease ([place])    a. 05/2015 Carotid U/S: RICA 3-50%, LICA 09-38%, RECA 1-82%, LECA >50% - overall stable, f/u 1 yr.  . Complication of anesthesia    Three days after procedure pt statrted to suffer with memoy loss that took about 4-5 days to come back according to [name]."  . Dilated cardiomyopathy ([place])    a. 05/2005 Echo: EF 65%; b. 01/22/2016 Echo: EF 25-30% (in setting of admission for urosepsis w/ elev trop); c. 01/26/2016 Echo: EF 35-40%, inf, distal apical, apical, and mid to distal septal HK-->felt to be 2/2 stress induced CM.  [name] Heart murmur   . History of bronchitis   . Hyperlipidemia   . Hypertension   . Hypertensive heart disease   . Memory impairment   . MRSA (methicillin resistant staph aureus) culture positive   . Nephrolithiasis    a. 12/2015 UVJ stone w/ hydroureteronephrosis req nephrostomy tube placement.  . Osteoarthritis    a. 03/9370 s/p L TKA complicated by infections req skin grafting.  [name] PVD (peripheral vascular disease) ([place])    a. Bilat common iliac dzs, nl ABI's --> med rx.  . Sepsis ([place])    a. 12/2015 admitted with Proteus mirabilis urosepsis/bacteremia.  . Wears glasses     Family History  Problem Relation Age of Onset  . Breast cancer Mother   . Leukemia Father   . Stroke Sister   . Bone cancer Other     Past Surgical History:  Procedure Laterality Date  . ABDOMINAL HYSTERECTOMY    . APPLICATION OF A-CELL OF EXTREMITY Left 12/08/2015   Procedure: APPLICATION OF A-CELL OF knee;  Surgeon: Loel Lofty [name], DO;  Location: [facility];  Service: Plastics;  Laterality: Left;  . APPLICATION OF WOUND VAC Left 11/16/2015   Procedure: APPLICATION OF WOUND VAC;  Surgeon: Loel Lofty [name], DO;  Location: [facility];  Service: Plastics;  Laterality: Left;  . APPLICATION OF WOUND VAC Left 12/08/2015   Procedure: APPLICATION OF WOUND VAC  AND CAST to  knee;  Surgeon: Loel Lofty [name], DO;  Location: [facility];  Service: Plastics;  Laterality: Left;  . COLONOSCOPY    . CORONARY ANGIOPLASTY  07   3 stents placed  . ESOPHAGOGASTRODUODENOSCOPY    . EYE SURGERY Bilateral    cataracts  . FREE FLAP TO EXTREMITY Left 11/16/2015   Procedure: FREE FLAP TO EXTREMITY;  Surgeon: Loel Lofty [name], DO;  Location: [facility];  Service: Plastics;  Laterality: Left;  . I&D EXTREMITY Left 10/18/2015   Procedure: Left Knee Washout, Reclosure;  Surgeon: Newt Minion, MD;  Location: [facility];  Service: Orthopedics;  Laterality: Left;  . I&D EXTREMITY Left 12/08/2015   Procedure: IRRIGATION AND DEBRIDEMENT knee;  Surgeon: Loel Lofty [name], DO;  Location: [facility];  Service: Plastics;  Laterality: Left;  . I&D KNEE WITH  POLY EXCHANGE Left 10/12/2015   Procedure: Irrigation and Debridement , Poly Exchange, Antibiotic Beads Left Knee;  Surgeon: Newt Minion, MD;  Location: [facility];  Service: Orthopedics;  Laterality: Left;  . INCISION AND DRAINAGE OF WOUND Left 01/04/2016   Procedure: DEBRIDEMENT OF LEFT KNEE WOUND WITH ACELL AND WOUND VAC PLACEMENT;  Surgeon: Loel Lofty [name], DO;  Location: [facility];  Service: Plastics;  Laterality: Left;  . IR GENERIC HISTORICAL  05/11/2016   IR FLUORO GUIDE CV MIDLINE PICC RIGHT 05/11/2016 Sandi Mariscal, MD [facility]  . IR GENERIC HISTORICAL  05/11/2016   IR US GUIDE VASC ACCESS RIGHT 05/11/2016 Sandi Mariscal, MD [facility]  . KNEE CLOSED REDUCTION Left 01/16/2017   Procedure: CLOSED MANIPULATION LEFT KNEE;  Surgeon: Leandrew Koyanagi, MD;  Location: [facility];  Service: Orthopedics;  Laterality: Left;  . KNEE SURGERY     denies  . SKIN SPLIT GRAFT Left 11/16/2015   Procedure: LEFT GASTROC MUSCLE FLAP WITH SKIN GRAFT SPLIT THICKNESS AND VAC PLACEMENT;  Surgeon: Loel Lofty [name], DO;  Location: [facility];  Service: Plastics;  Laterality: Left;  . TOTAL HIP ARTHROPLASTY Right 09/10/2012   Dr Sharol Given  . TOTAL HIP ARTHROPLASTY  Right 09/10/2012   Procedure: TOTAL HIP ARTHROPLASTY;  Surgeon: Newt Minion, MD;  Location: [facility];  Service: Orthopedics;  Laterality: Right;  Right Total Hip Arthroplasty  . TOTAL HIP ARTHROPLASTY Left 06/27/2016   Procedure: LEFT TOTAL HIP ARTHROPLASTY ANTERIOR APPROACH;  Surgeon: Leandrew Koyanagi, MD;  Location: [facility];  Service: Orthopedics;  Laterality: Left;  . TOTAL KNEE ARTHROPLASTY Left 09/28/2015   Procedure: TOTAL KNEE ARTHROPLASTY;  Surgeon: Newt Minion, MD;  Location: [facility];  Service: Orthopedics;  Laterality: Left;   Social History   Occupational History  . Occupation: Retired  Tobacco Use  . Smoking status: Former Smoker    Packs/day: 0.50    Years: 50.00    Pack years: 25.00    Types: Cigarettes    Quit date: 09/28/2015    Years since quitting: 3.3  . Smokeless tobacco: Never Used  . Tobacco comment: quit 09/27/15  Substance and Sexual Activity  . Alcohol use: No  . Drug use: No  . Sexual activity: Never

## 2019-02-04 NOTE — Telephone Encounter (Signed)
Right knee gel injection  

## 2019-02-06 ENCOUNTER — Telehealth: Payer: Self-pay

## 2019-02-06 NOTE — Telephone Encounter (Signed)
Submitted VOB for Monovisc, right knee. 

## 2019-02-06 NOTE — Telephone Encounter (Signed)
Noted  

## 2019-02-17 ENCOUNTER — Telehealth: Payer: Self-pay

## 2019-02-17 NOTE — Telephone Encounter (Signed)
Approved for Monovisc, right knee. Buy & Bill Covered at 100% through secondary insurance ([employer]) No Co-pay No PA required  Appt. 03/04/2019 with Dr. Ninfa Linden

## 2019-03-01 ENCOUNTER — Other Ambulatory Visit: Payer: Self-pay | Admitting: Cardiology

## 2019-03-04 ENCOUNTER — Encounter: Payer: Self-pay | Admitting: Physician Assistant

## 2019-03-04 ENCOUNTER — Ambulatory Visit (INDEPENDENT_AMBULATORY_CARE_PROVIDER_SITE_OTHER): Payer: Medicare Other | Admitting: Physician Assistant

## 2019-03-04 DIAGNOSIS — I2510 Atherosclerotic heart disease of native coronary artery without angina pectoris: Secondary | ICD-10-CM | POA: Diagnosis not present

## 2019-03-04 DIAGNOSIS — M1711 Unilateral primary osteoarthritis, right knee: Secondary | ICD-10-CM | POA: Diagnosis not present

## 2019-03-04 MED ORDER — HYALURONAN 88 MG/4ML IX SOSY
88.0000 mg | PREFILLED_SYRINGE | INTRA_ARTICULAR | Status: AC | PRN
  Administered 2019-03-04: 16:00:00 88 mg via INTRA_ARTICULAR

## 2019-03-04 NOTE — Progress Notes (Signed)
   Procedure Note  Patient: [name]             Date of Birth: 1940/10/24           MRN: [id number]             Visit Date: 03/04/2019  HPI: Ms. Standre comes in today for injection with Monovisc right knee.  She reports that the cortisone injection in the right knee on 02/04/2019 by Dr. Ninfa Linden gave her good relief.  She is had no new injury to the knee.  She is more concerned about her left knee which she had a left total knee arthroplasty and she has a ulceration over the anterior aspect of the knee.  Physical exam: Right knee good range of motion without pain.  No abnormal warmth erythema.  Left knee small ulceration anterior lateral aspect of the knee grade 1 Waggoner ulceration.  Slight in erythema just proximal to this which likely represents a contact dermatitis.  Procedures: Visit Diagnoses:  1. Unilateral primary osteoarthritis, right knee     Large Joint Inj: R knee on 03/04/2019 3:52 PM Indications: pain Details: 22 G 1.5 in needle, anterolateral approach  Arthrogram: No  Medications: 88 mg Hyaluronan 88 MG/4ML Outcome: tolerated well, no immediate complications Procedure, treatment alternatives, risks and benefits explained, specific risks discussed. Consent was given by the patient. Immediately prior to procedure a time out was called to verify the correct patient, procedure, equipment, support staff and site/side marked as required. Patient was prepped and draped in the usual sterile fashion.     Plan: Have her follow-up in 1 week to check the left knee wound.  We will have her wash the wound daily with an antibacterial soap.  Then apply thin layer of mupirocin over this.  In regards to the right knee she understands that she can only have Monovisc or supplemental injection every 6 months and cortisone injection every 3 months.  Questions encouraged and answered at length.

## 2019-03-11 ENCOUNTER — Encounter: Payer: Self-pay | Admitting: Physician Assistant

## 2019-03-11 ENCOUNTER — Ambulatory Visit (INDEPENDENT_AMBULATORY_CARE_PROVIDER_SITE_OTHER): Payer: Medicare Other | Admitting: Physician Assistant

## 2019-03-11 VITALS — Ht 62.0 in | Wt 122.0 lb

## 2019-03-11 DIAGNOSIS — S83015A Lateral dislocation of left patella, initial encounter: Secondary | ICD-10-CM | POA: Diagnosis not present

## 2019-03-11 DIAGNOSIS — L97811 Non-pressure chronic ulcer of other part of right lower leg limited to breakdown of skin: Secondary | ICD-10-CM | POA: Diagnosis not present

## 2019-03-11 DIAGNOSIS — I2510 Atherosclerotic heart disease of native coronary artery without angina pectoris: Secondary | ICD-10-CM

## 2019-03-11 MED ORDER — TRAMADOL HCL 50 MG PO TABS: 50.0000 mg | ORAL_TABLET | Freq: Two times a day (BID) | ORAL | 0 refills | Status: DC

## 2019-03-11 NOTE — Progress Notes (Signed)
HPI: Mrs. Dimichele returns today follow-up of left knee wound check.  She is been applying the Bactroban over the left knee ulcer after washing with antibacterial soap.  Patient has a chronic subluxation of her left patella that she is status post left total knee arthroplasty 09/28/2015.  Patient's postoperative course complicated by wound dehiscence and chronic draining wound of the knee that needed rotational gastroc flap.  She underwent manipulation of her left knee under anesthesia 01/16/2017.  She is unsure how long the ulceration is been present.  She does not remember any frank injury.  She is on chronic suppression with rifampin and doxycycline secondary to left knee infection. She states that her right knee which she has known osteoarthritis is better after the Monovisc injection which was done 03/04/2019.  Left knee: Erythema distal anterior thigh is improved.  Stage I Wegner ulceration also appears improved since last visit.  There is no purulence.  No abnormal.  ~Subluxed laterally.  She has slight flexion contracture about 15 degrees and a valgus deformity of the knee.  Impression: Left knee ulceration.   Plan she will continue current course with washing the wound with an antibacterial soap daily and then applying a small amount of Bactroban.  She will continue her rifampin and doxycycline.  Follow-up with [place] in 4 weeks check her wound sooner if there is any signs of gross infection or concerns.  Questions were encouraged and answered both the patient and her daughters present today

## 2019-03-11 NOTE — Addendum Note (Signed)
Addended by: Erskine Emery on: 03/11/2019 05:30 PM   Modules accepted: Orders

## 2019-04-01 ENCOUNTER — Encounter (HOSPITAL_COMMUNITY): Payer: Self-pay

## 2019-04-01 ENCOUNTER — Inpatient Hospital Stay (HOSPITAL_COMMUNITY)
Admission: EM | Admit: 2019-04-01 | Discharge: 2019-04-08 | DRG: 391 | Disposition: A | Discharge status: Skilled Nursing Facility | Payer: Medicare Other | Attending: Internal Medicine | Admitting: Internal Medicine

## 2019-04-01 ENCOUNTER — Emergency Department (HOSPITAL_COMMUNITY): Payer: Medicare Other

## 2019-04-01 ENCOUNTER — Other Ambulatory Visit: Payer: Self-pay

## 2019-04-01 DIAGNOSIS — B9562 Methicillin resistant Staphylococcus aureus infection as the cause of diseases classified elsewhere: Secondary | ICD-10-CM | POA: Diagnosis present

## 2019-04-01 DIAGNOSIS — Z95820 Peripheral vascular angioplasty status with implants and grafts: Secondary | ICD-10-CM

## 2019-04-01 DIAGNOSIS — Z87442 Personal history of urinary calculi: Secondary | ICD-10-CM

## 2019-04-01 DIAGNOSIS — N390 Urinary tract infection, site not specified: Secondary | ICD-10-CM | POA: Diagnosis present

## 2019-04-01 DIAGNOSIS — R55 Syncope and collapse: Secondary | ICD-10-CM

## 2019-04-01 DIAGNOSIS — Z96659 Presence of unspecified artificial knee joint: Secondary | ICD-10-CM

## 2019-04-01 DIAGNOSIS — E860 Dehydration: Secondary | ICD-10-CM | POA: Diagnosis not present

## 2019-04-01 DIAGNOSIS — N179 Acute kidney failure, unspecified: Secondary | ICD-10-CM

## 2019-04-01 DIAGNOSIS — R197 Diarrhea, unspecified: Secondary | ICD-10-CM

## 2019-04-01 DIAGNOSIS — Z955 Presence of coronary angioplasty implant and graft: Secondary | ICD-10-CM

## 2019-04-01 DIAGNOSIS — W19XXXA Unspecified fall, initial encounter: Secondary | ICD-10-CM | POA: Diagnosis present

## 2019-04-01 DIAGNOSIS — A419 Sepsis, unspecified organism: Secondary | ICD-10-CM | POA: Diagnosis not present

## 2019-04-01 DIAGNOSIS — R3129 Other microscopic hematuria: Secondary | ICD-10-CM | POA: Diagnosis present

## 2019-04-01 DIAGNOSIS — I420 Dilated cardiomyopathy: Secondary | ICD-10-CM | POA: Diagnosis present

## 2019-04-01 DIAGNOSIS — Z803 Family history of malignant neoplasm of breast: Secondary | ICD-10-CM

## 2019-04-01 DIAGNOSIS — Y92009 Unspecified place in unspecified non-institutional (private) residence as the place of occurrence of the external cause: Secondary | ICD-10-CM

## 2019-04-01 DIAGNOSIS — M00062 Staphylococcal arthritis, left knee: Secondary | ICD-10-CM | POA: Diagnosis present

## 2019-04-01 DIAGNOSIS — T8459XS Infection and inflammatory reaction due to other internal joint prosthesis, sequela: Secondary | ICD-10-CM | POA: Diagnosis not present

## 2019-04-01 DIAGNOSIS — E785 Hyperlipidemia, unspecified: Secondary | ICD-10-CM | POA: Diagnosis present

## 2019-04-01 DIAGNOSIS — Z806 Family history of leukemia: Secondary | ICD-10-CM

## 2019-04-01 DIAGNOSIS — I6529 Occlusion and stenosis of unspecified carotid artery: Secondary | ICD-10-CM | POA: Diagnosis present

## 2019-04-01 DIAGNOSIS — B9620 Unspecified Escherichia coli [E. coli] as the cause of diseases classified elsewhere: Secondary | ICD-10-CM | POA: Diagnosis present

## 2019-04-01 DIAGNOSIS — S0993XA Unspecified injury of face, initial encounter: Secondary | ICD-10-CM | POA: Diagnosis not present

## 2019-04-01 DIAGNOSIS — R5381 Other malaise: Secondary | ICD-10-CM | POA: Diagnosis not present

## 2019-04-01 DIAGNOSIS — Z791 Long term (current) use of non-steroidal anti-inflammatories (NSAID): Secondary | ICD-10-CM

## 2019-04-01 DIAGNOSIS — Z8614 Personal history of Methicillin resistant Staphylococcus aureus infection: Secondary | ICD-10-CM

## 2019-04-01 DIAGNOSIS — B964 Proteus (mirabilis) (morganii) as the cause of diseases classified elsewhere: Secondary | ICD-10-CM | POA: Diagnosis present

## 2019-04-01 DIAGNOSIS — T8459XA Infection and inflammatory reaction due to other internal joint prosthesis, initial encounter: Secondary | ICD-10-CM

## 2019-04-01 DIAGNOSIS — S299XXA Unspecified injury of thorax, initial encounter: Secondary | ICD-10-CM | POA: Diagnosis not present

## 2019-04-01 DIAGNOSIS — Z20828 Contact with and (suspected) exposure to other viral communicable diseases: Secondary | ICD-10-CM | POA: Diagnosis present

## 2019-04-01 DIAGNOSIS — S0011XA Contusion of right eyelid and periocular area, initial encounter: Secondary | ICD-10-CM | POA: Diagnosis present

## 2019-04-01 DIAGNOSIS — G3184 Mild cognitive impairment, so stated: Secondary | ICD-10-CM | POA: Diagnosis present

## 2019-04-01 DIAGNOSIS — S0590XA Unspecified injury of unspecified eye and orbit, initial encounter: Secondary | ICD-10-CM | POA: Diagnosis not present

## 2019-04-01 DIAGNOSIS — I082 Rheumatic disorders of both aortic and tricuspid valves: Secondary | ICD-10-CM | POA: Diagnosis present

## 2019-04-01 DIAGNOSIS — G8929 Other chronic pain: Secondary | ICD-10-CM | POA: Diagnosis present

## 2019-04-01 DIAGNOSIS — I10 Essential (primary) hypertension: Secondary | ICD-10-CM | POA: Diagnosis not present

## 2019-04-01 DIAGNOSIS — Z03818 Encounter for observation for suspected exposure to other biological agents ruled out: Secondary | ICD-10-CM | POA: Diagnosis not present

## 2019-04-01 DIAGNOSIS — I739 Peripheral vascular disease, unspecified: Secondary | ICD-10-CM | POA: Diagnosis present

## 2019-04-01 DIAGNOSIS — Z66 Do not resuscitate: Secondary | ICD-10-CM | POA: Diagnosis present

## 2019-04-01 DIAGNOSIS — S0990XA Unspecified injury of head, initial encounter: Secondary | ICD-10-CM | POA: Diagnosis not present

## 2019-04-01 DIAGNOSIS — Z96652 Presence of left artificial knee joint: Secondary | ICD-10-CM | POA: Diagnosis present

## 2019-04-01 DIAGNOSIS — S199XXA Unspecified injury of neck, initial encounter: Secondary | ICD-10-CM | POA: Diagnosis not present

## 2019-04-01 DIAGNOSIS — K529 Noninfective gastroenteritis and colitis, unspecified: Principal | ICD-10-CM | POA: Diagnosis present

## 2019-04-01 DIAGNOSIS — Z7982 Long term (current) use of aspirin: Secondary | ICD-10-CM

## 2019-04-01 DIAGNOSIS — F17200 Nicotine dependence, unspecified, uncomplicated: Secondary | ICD-10-CM | POA: Diagnosis present

## 2019-04-01 DIAGNOSIS — Z823 Family history of stroke: Secondary | ICD-10-CM

## 2019-04-01 DIAGNOSIS — I119 Hypertensive heart disease without heart failure: Secondary | ICD-10-CM | POA: Diagnosis present

## 2019-04-01 DIAGNOSIS — D72829 Elevated white blood cell count, unspecified: Secondary | ICD-10-CM

## 2019-04-01 DIAGNOSIS — E861 Hypovolemia: Secondary | ICD-10-CM | POA: Diagnosis present

## 2019-04-01 DIAGNOSIS — Z96643 Presence of artificial hip joint, bilateral: Secondary | ICD-10-CM | POA: Diagnosis present

## 2019-04-01 DIAGNOSIS — T465X5A Adverse effect of other antihypertensive drugs, initial encounter: Secondary | ICD-10-CM | POA: Diagnosis present

## 2019-04-01 DIAGNOSIS — D696 Thrombocytopenia, unspecified: Secondary | ICD-10-CM | POA: Diagnosis present

## 2019-04-01 DIAGNOSIS — S4991XA Unspecified injury of right shoulder and upper arm, initial encounter: Secondary | ICD-10-CM | POA: Diagnosis not present

## 2019-04-01 DIAGNOSIS — Z90710 Acquired absence of both cervix and uterus: Secondary | ICD-10-CM

## 2019-04-01 DIAGNOSIS — I2510 Atherosclerotic heart disease of native coronary artery without angina pectoris: Secondary | ICD-10-CM | POA: Diagnosis present

## 2019-04-01 DIAGNOSIS — Z792 Long term (current) use of antibiotics: Secondary | ICD-10-CM

## 2019-04-01 DIAGNOSIS — Z7952 Long term (current) use of systemic steroids: Secondary | ICD-10-CM

## 2019-04-01 DIAGNOSIS — S0083XA Contusion of other part of head, initial encounter: Secondary | ICD-10-CM

## 2019-04-01 DIAGNOSIS — Z79899 Other long term (current) drug therapy: Secondary | ICD-10-CM

## 2019-04-01 DIAGNOSIS — Z79891 Long term (current) use of opiate analgesic: Secondary | ICD-10-CM

## 2019-04-01 LAB — CBC WITH DIFFERENTIAL/PLATELET
Abs Immature Granulocytes: 0.8 10*3/uL — ABNORMAL HIGH (ref 0.00–0.07)
Basophils Absolute: 0.2 10*3/uL — ABNORMAL HIGH (ref 0.0–0.1)
Basophils Relative: 1 %
Eosinophils Absolute: 0 10*3/uL (ref 0.0–0.5)
Eosinophils Relative: 0 %
HCT: 36.1 % (ref 36.0–46.0)
Hemoglobin: 13.4 g/dL (ref 12.0–15.0)
Lymphocytes Relative: 0 %
Lymphs Abs: 0 10*3/uL — ABNORMAL LOW (ref 0.7–4.0)
MCH: 38.6 pg — ABNORMAL HIGH (ref 26.0–34.0)
MCHC: 37.1 g/dL — ABNORMAL HIGH (ref 30.0–36.0)
MCV: 104 fL — ABNORMAL HIGH (ref 80.0–100.0)
Metamyelocytes Relative: 3 %
Monocytes Absolute: 0.6 10*3/uL (ref 0.1–1.0)
Monocytes Relative: 3 %
Myelocytes: 1 %
Neutro Abs: 18.7 10*3/uL — ABNORMAL HIGH (ref 1.7–7.7)
Neutrophils Relative %: 92 %
Platelets: 121 10*3/uL — ABNORMAL LOW (ref 150–400)
RBC: 3.47 MIL/uL — ABNORMAL LOW (ref 3.87–5.11)
RDW: 14.3 % (ref 11.5–15.5)
WBC: 20.3 10*3/uL — ABNORMAL HIGH (ref 4.0–10.5)
nRBC: 0 % (ref 0.0–0.2)
nRBC: 0 /100 WBC

## 2019-04-01 LAB — URINALYSIS, ROUTINE W REFLEX MICROSCOPIC
Bilirubin Urine: NEGATIVE
Glucose, UA: NEGATIVE mg/dL
Ketones, ur: NEGATIVE mg/dL
Nitrite: NEGATIVE
Protein, ur: 100 mg/dL — AB
Specific Gravity, Urine: 1.019 (ref 1.005–1.030)
WBC, UA: >50 WBC/hpf — ABNORMAL HIGH (ref 0–5)
pH: 5 (ref 5.0–8.0)

## 2019-04-01 LAB — COMPREHENSIVE METABOLIC PANEL
ALT: 24 U/L (ref 0–44)
AST: 52 U/L — ABNORMAL HIGH (ref 15–41)
Albumin: 3 g/dL — ABNORMAL LOW (ref 3.5–5.0)
Alkaline Phosphatase: 118 U/L (ref 38–126)
Anion gap: 14 (ref 5–15)
BUN: 28 mg/dL — ABNORMAL HIGH (ref 8–23)
CO2: 22 mmol/L (ref 22–32)
Calcium: 9.6 mg/dL (ref 8.9–10.3)
Chloride: 104 mmol/L (ref 98–111)
Creatinine, Ser: 1.79 mg/dL — ABNORMAL HIGH (ref 0.44–1.00)
GFR calc Af Amer: 31 mL/min — ABNORMAL LOW (ref >60)
GFR calc non Af Amer: 27 mL/min — ABNORMAL LOW (ref >60)
Glucose, Bld: 136 mg/dL — ABNORMAL HIGH (ref 70–99)
Potassium: 4 mmol/L (ref 3.5–5.1)
Sodium: 140 mmol/L (ref 135–145)
Total Bilirubin: 1.7 mg/dL — ABNORMAL HIGH (ref 0.3–1.2)
Total Protein: 6 g/dL — ABNORMAL LOW (ref 6.5–8.1)

## 2019-04-01 LAB — SARS CORONAVIRUS 2 (TAT 6-24 HRS): SARS Coronavirus 2: NEGATIVE

## 2019-04-01 LAB — TSH: TSH: 1.852 u[IU]/mL (ref 0.350–4.500)

## 2019-04-01 LAB — CK: Total CK: 457 U/L — ABNORMAL HIGH (ref 38–234)

## 2019-04-01 MED ORDER — CARVEDILOL 12.5 MG PO TABS
12.5000 mg | ORAL_TABLET | Freq: Two times a day (BID) | ORAL | Status: DC
  Administered 2019-04-01 – 2019-04-04 (×7): 12.5 mg via ORAL
  Filled 2019-04-01 (×7): qty 1

## 2019-04-01 MED ORDER — ACETAMINOPHEN 325 MG PO TABS
650.0000 mg | ORAL_TABLET | Freq: Four times a day (QID) | ORAL | Status: DC | PRN
  Administered 2019-04-03: 650 mg via ORAL
  Filled 2019-04-01: qty 2

## 2019-04-01 MED ORDER — TRAMADOL HCL 50 MG PO TABS
50.0000 mg | ORAL_TABLET | Freq: Every day | ORAL | Status: DC
  Administered 2019-04-01 – 2019-04-08 (×8): 50 mg via ORAL
  Filled 2019-04-01 (×8): qty 1

## 2019-04-01 MED ORDER — CARVEDILOL 12.5 MG PO TABS: 12.5000 mg | ORAL_TABLET | Freq: Two times a day (BID) | ORAL | Status: DC

## 2019-04-01 MED ORDER — SODIUM CHLORIDE 0.9 % IV BOLUS: 1000.0000 mL | Freq: Once | INTRAVENOUS | Status: DC

## 2019-04-01 MED ORDER — AMLODIPINE BESYLATE 10 MG PO TABS
10.0000 mg | ORAL_TABLET | Freq: Every day | ORAL | Status: DC
  Administered 2019-04-02 – 2019-04-08 (×7): 10 mg via ORAL
  Filled 2019-04-01 (×3): qty 1
  Filled 2019-04-01: qty 2
  Filled 2019-04-01 (×4): qty 1

## 2019-04-01 MED ORDER — ONDANSETRON HCL 4 MG/2ML IJ SOLN: 4.0000 mg | Freq: Four times a day (QID) | INTRAMUSCULAR | Status: DC | PRN

## 2019-04-01 MED ORDER — ONDANSETRON HCL 4 MG PO TABS: 4.0000 mg | ORAL_TABLET | Freq: Four times a day (QID) | ORAL | Status: DC | PRN

## 2019-04-01 MED ORDER — SODIUM CHLORIDE 0.9 % IV BOLUS
500.0000 mL | Freq: Once | INTRAVENOUS | Status: AC
  Administered 2019-04-01: 500 mL via INTRAVENOUS

## 2019-04-01 MED ORDER — MESALAMINE 1.2 G PO TBEC
2.4000 g | DELAYED_RELEASE_TABLET | Freq: Every day | ORAL | Status: DC
  Administered 2019-04-02 – 2019-04-08 (×7): 2.4 g via ORAL
  Filled 2019-04-01 (×10): qty 2

## 2019-04-01 MED ORDER — MUPIROCIN CALCIUM 2 % EX CREA
TOPICAL_CREAM | Freq: Every day | CUTANEOUS | Status: DC
  Administered 2019-04-02 – 2019-04-08 (×7): via TOPICAL
  Filled 2019-04-01: qty 15

## 2019-04-01 MED ORDER — MORPHINE SULFATE (PF) 2 MG/ML IV SOLN
2.0000 mg | INTRAVENOUS | Status: DC | PRN
  Administered 2019-04-02 – 2019-04-06 (×2): 2 mg via INTRAVENOUS
  Filled 2019-04-01 (×2): qty 1

## 2019-04-01 MED ORDER — ATORVASTATIN CALCIUM 40 MG PO TABS
40.0000 mg | ORAL_TABLET | Freq: Every day | ORAL | Status: DC
  Administered 2019-04-01 – 2019-04-07 (×7): 40 mg via ORAL
  Filled 2019-04-01 (×7): qty 1

## 2019-04-01 MED ORDER — LACTATED RINGERS IV SOLN
INTRAVENOUS | Status: DC
  Administered 2019-04-01: 18:00:00 via INTRAVENOUS

## 2019-04-01 MED ORDER — TRAMADOL HCL 50 MG PO TABS: 50.0000 mg | ORAL_TABLET | Freq: Every day | ORAL | Status: DC

## 2019-04-01 MED ORDER — ADULT MULTIVITAMIN W/MINERALS CH: 1.0000 | ORAL_TABLET | Freq: Every day | ORAL | Status: DC

## 2019-04-01 MED ORDER — ASPIRIN EC 81 MG PO TBEC: 81.0000 mg | DELAYED_RELEASE_TABLET | Freq: Every day | ORAL | Status: DC

## 2019-04-01 MED ORDER — ACETAMINOPHEN 650 MG RE SUPP: 650.0000 mg | Freq: Four times a day (QID) | RECTAL | Status: DC | PRN

## 2019-04-01 MED ORDER — VANCOMYCIN 50 MG/ML ORAL SOLUTION
125.0000 mg | Freq: Four times a day (QID) | ORAL | Status: DC
  Administered 2019-04-01 – 2019-04-03 (×7): 125 mg via ORAL
  Filled 2019-04-01 (×10): qty 2.5

## 2019-04-01 MED ORDER — RIFAMPIN 300 MG PO CAPS
300.0000 mg | ORAL_CAPSULE | Freq: Two times a day (BID) | ORAL | Status: DC
  Administered 2019-04-01 – 2019-04-08 (×14): 300 mg via ORAL
  Filled 2019-04-01 (×16): qty 1

## 2019-04-01 MED ORDER — DOXYCYCLINE HYCLATE 100 MG PO TABS
100.0000 mg | ORAL_TABLET | Freq: Two times a day (BID) | ORAL | Status: DC
  Administered 2019-04-01 – 2019-04-02 (×3): 100 mg via ORAL
  Filled 2019-04-01 (×4): qty 1

## 2019-04-01 MED ORDER — SODIUM CHLORIDE 0.9% FLUSH
3.0000 mL | Freq: Two times a day (BID) | INTRAVENOUS | Status: DC
  Administered 2019-04-01 – 2019-04-08 (×12): 3 mL via INTRAVENOUS

## 2019-04-01 MED ORDER — ADULT MULTIVITAMIN W/MINERALS CH
1.0000 | ORAL_TABLET | Freq: Every day | ORAL | Status: DC
  Administered 2019-04-01 – 2019-04-08 (×8): 1 via ORAL
  Filled 2019-04-01 (×8): qty 1

## 2019-04-01 MED ORDER — MAGNESIUM OXIDE 400 (241.3 MG) MG PO TABS
200.0000 mg | ORAL_TABLET | ORAL | Status: DC
  Administered 2019-04-01 – 2019-04-08 (×7): 200 mg via ORAL
  Filled 2019-04-01 (×9): qty 1

## 2019-04-01 MED ORDER — ASPIRIN EC 81 MG PO TBEC
81.0000 mg | DELAYED_RELEASE_TABLET | Freq: Every day | ORAL | Status: DC
  Administered 2019-04-01 – 2019-04-08 (×8): 81 mg via ORAL
  Filled 2019-04-01 (×8): qty 1

## 2019-04-01 NOTE — ED Notes (Signed)
Dr. Schlossman at bedside. 

## 2019-04-01 NOTE — ED Triage Notes (Signed)
Per [facility]: From home. Fell last night, does not recall getting up or falling. Woke up this AM on the bathroom floor. Bruising and swelling to right eye and side of face is related to fall. Pt denies pain. Pt reports that she has had diarrhea "for a long time". Pt has bruising and redness to right shoulder, right elbow, right hip, right ankle. Pt alert and oriented. Pt following commands.

## 2019-04-01 NOTE — H&P (Signed)
History and Physical    REMMINGTON TETERS TMA:[id number] DOB: 1940-08-10 DOA: 04/01/2019  PCP: Darreld Mclean, MD Consultants:  Ninfa Linden - orthopedics; [facility] - cardiology; Megan Salon - ID Patient coming from:  Home - lives alone; NOK: Son, [phone]  Chief Complaint: Fall, syncope  HPI: [name] is a 78 y.o. female with medical history significant of CAD s/p stents; HTN; HLD; and chronic L knee MRSA infection on suppressive antibiotics presenting with a fall.  Patient has a "tremendous medical record."  She has a black eye.  She was feeling so good and getting around so good the week before that happened.  She fell and got a black eye.  She got up and tried to get back in the bed and couldn't, she couldn't move.  She thought she would be ok this morning and she wasn't and so her sons made her come in.  She fell last night.  She has "almost a standing appointment with the orthopedic place."  She felt fine yesterday before she fell and she has no idea why she fell.  She doesn't remember anything about it.  She felt fine and was getting ready for bed and then found herself in the floor.  She awoke about 0400 in the bathroom floor.  She crawled herself back to bed.  She has a life alert but did not push it.  She was unable to get into bed and went back to sleep in the floor.  When she woke up, she was in the floor still and so pushed her button.  She has had diarrhea for many years, but it is worse now.  She wears Depends at home.  She has 2 stools every morning - one as soon as she gets up and another immediately after eating.  She has stools again immediately after lunch and dinner.  She usually has 4 stools a day every day.  Yesterday she had more stools - she ate more and had additional stools.    ED Course:  Syncope, AKI, ?C diff.  Chronic loose stools but excessive diarrhea overnight.  She woke up in the bathroom floor last night, doesn't remember falling.  Has chronic knee infection, on  doxy and Rifampin chronically.  No UA back, creatinine 1.79 up from 0.8.  Review of Systems: As per HPI; otherwise review of systems reviewed and negative.   Ambulatory Status:  Ambulates with a walker  Past Medical History:  Diagnosis Date   Arthritis    CAD (coronary artery disease)    a. 02/2007 s/p PCI/DES ot LCX and RCA;  b. 07/2014 MV: no ischemia/infarct, EF 68%.   Carotid arterial disease ([place])    a. 05/2015 Carotid U/S: RICA 2-87%, LICA 68-11%, RECA 5-72%, LECA >50% - overall stable, f/u 1 yr.   Complication of anesthesia    Three days after procedure pt statrted to suffer with memoy loss that took about 4-5 days to come back according to pt son [name]."   Dilated cardiomyopathy ([place])    a. 05/2005 Echo: EF 65%; b. 01/22/2016 Echo: EF 25-30% (in setting of admission for urosepsis w/ elev trop); c. 01/26/2016 Echo: EF 35-40%, inf, distal apical, apical, and mid to distal septal HK-->felt to be 2/2 stress induced CM.   Heart murmur    History of bronchitis    Hyperlipidemia    Hypertension    Hypertensive heart disease    Memory impairment    MRSA (methicillin resistant staph aureus) culture positive  Nephrolithiasis    a. 12/2015 UVJ stone w/ hydroureteronephrosis req nephrostomy tube placement.   Osteoarthritis    a. 02/1447 s/p L TKA complicated by infections req skin grafting.   PVD (peripheral vascular disease) ([place])    a. Bilat common iliac dzs, nl ABI's --> med rx.   Sepsis ([place])    a. 12/2015 admitted with Proteus mirabilis urosepsis/bacteremia.   Wears glasses     Past Surgical History:  Procedure Laterality Date   ABDOMINAL HYSTERECTOMY     APPLICATION OF A-CELL OF EXTREMITY Left 12/08/2015   Procedure: APPLICATION OF A-CELL OF knee;  Surgeon: Loel Lofty [name], DO;  Location: [place];  Service: Plastics;  Laterality: Left;   APPLICATION OF WOUND VAC Left 11/16/2015   Procedure: APPLICATION OF WOUND VAC;  Surgeon: Loel Lofty [name], DO;   Location: [place];  Service: Plastics;  Laterality: Left;   APPLICATION OF WOUND VAC Left 12/08/2015   Procedure: APPLICATION OF WOUND VAC  AND CAST to knee;  Surgeon: Loel Lofty [name], DO;  Location: [place];  Service: Plastics;  Laterality: Left;   COLONOSCOPY     CORONARY ANGIOPLASTY  07   3 stents placed   ESOPHAGOGASTRODUODENOSCOPY     EYE SURGERY Bilateral    cataracts   FREE FLAP TO EXTREMITY Left 11/16/2015   Procedure: FREE FLAP TO EXTREMITY;  Surgeon: Loel Lofty [name], DO;  Location: [place];  Service: Plastics;  Laterality: Left;   I&D EXTREMITY Left 10/18/2015   Procedure: Left Knee Washout, Reclosure;  Surgeon: Newt Minion, MD;  Location: [place];  Service: Orthopedics;  Laterality: Left;   I&D EXTREMITY Left 12/08/2015   Procedure: IRRIGATION AND DEBRIDEMENT knee;  Surgeon: Loel Lofty [name], DO;  Location: [place];  Service: Plastics;  Laterality: Left;   I&D KNEE WITH POLY EXCHANGE Left 10/12/2015   Procedure: Irrigation and Debridement , Poly Exchange, Antibiotic Beads Left Knee;  Surgeon: Newt Minion, MD;  Location: [place];  Service: Orthopedics;  Laterality: Left;   INCISION AND DRAINAGE OF WOUND Left 01/04/2016   Procedure: DEBRIDEMENT OF LEFT KNEE WOUND WITH ACELL AND WOUND VAC PLACEMENT;  Surgeon: Loel Lofty [name], DO;  Location: [place];  Service: Plastics;  Laterality: Left;   IR GENERIC HISTORICAL  05/11/2016   IR FLUORO GUIDE CV MIDLINE PICC RIGHT 05/11/2016 Sandi Mariscal, MD [facility]   IR GENERIC HISTORICAL  05/11/2016   IR US GUIDE VASC ACCESS RIGHT 05/11/2016 Sandi Mariscal, MD [facility]   KNEE CLOSED REDUCTION Left 01/16/2017   Procedure: CLOSED MANIPULATION LEFT KNEE;  Surgeon: Leandrew Koyanagi, MD;  Location: [place];  Service: Orthopedics;  Laterality: Left;   KNEE SURGERY     denies   SKIN SPLIT GRAFT Left 11/16/2015   Procedure: LEFT GASTROC MUSCLE FLAP WITH SKIN GRAFT SPLIT THICKNESS AND VAC PLACEMENT;  Surgeon: Loel Lofty  [name], DO;  Location: [place];  Service: Plastics;  Laterality: Left;   TOTAL HIP ARTHROPLASTY Right 09/10/2012   Dr Sharol Given   TOTAL HIP ARTHROPLASTY Right 09/10/2012   Procedure: TOTAL HIP ARTHROPLASTY;  Surgeon: Newt Minion, MD;  Location: [place];  Service: Orthopedics;  Laterality: Right;  Right Total Hip Arthroplasty   TOTAL HIP ARTHROPLASTY Left 06/27/2016   Procedure: LEFT TOTAL HIP ARTHROPLASTY ANTERIOR APPROACH;  Surgeon: Leandrew Koyanagi, MD;  Location: [place];  Service: Orthopedics;  Laterality: Left;   TOTAL KNEE ARTHROPLASTY Left 09/28/2015   Procedure: TOTAL KNEE ARTHROPLASTY;  Surgeon: Meridee Score  V, MD;  Location: [place];  Service: Orthopedics;  Laterality: Left;    Social History   Socioeconomic History   Marital status: Single    Spouse name: Not on file   Number of children: Not on file   Years of education: Not on file   Highest education level: Not on file  Occupational History   Occupation: Retired  Scientist, product/process development strain: Not on file   Food insecurity    Worry: Not on file    Inability: Not on Lexicographer needs    Medical: Not on file    Non-medical: Not on file  Tobacco Use   Smoking status: Former Smoker    Packs/day: 0.50    Years: 50.00    Pack years: 25.00    Types: Cigarettes    Quit date: 09/28/2015    Years since quitting: 3.5   Smokeless tobacco: Never Used   Tobacco comment: quit 09/27/15  Substance and Sexual Activity   Alcohol use: No   Drug use: No   Sexual activity: Never  Lifestyle   Physical activity    Days per week: Not on file    Minutes per session: Not on file   Stress: Not on file  Relationships   Social connections    Talks on phone: Not on file    Gets together: Not on file    Attends religious service: Not on file    Active member of club or organization: Not on file    Attends meetings of clubs or organizations: Not on file    Relationship status: Not on file   Intimate  partner violence    Fear of current or ex partner: Not on file    Emotionally abused: Not on file    Physically abused: Not on file    Forced sexual activity: Not on file  Other Topics Concern   Not on file  Social History Narrative   She has been living with her daughter when not in rehab since the surgery in March.    No Known Allergies  Family History  Problem Relation Age of Onset   Breast cancer Mother    Leukemia Father    Stroke Sister    Bone cancer Other     Prior to Admission medications   Medication Sig Start Date End Date Taking? Authorizing Provider  amLODipine (NORVASC) 10 MG tablet Take 1 tablet (10 mg total) by mouth daily. 07/26/18   Shelly Coss, MD  amLODipine (NORVASC) 5 MG tablet TAKE ONE TABLET BY MOUTH DAILY 07/28/18   Lelon Perla, MD  aspirin EC 81 MG tablet Take 81 mg by mouth daily.    [provider]  atorvastatin (LIPITOR) 40 MG tablet TAKE ONE TABLET BY MOUTH DAILY 03/02/19   Lelon Perla, MD  Calcium Carbonate-Vit D-Min (CALCIUM 1200 PO) Take 1 tablet by mouth daily.     [provider]  carvedilol (COREG) 12.5 MG tablet TAKE ONE TABLET BY MOUTH TWICE A DAY 07/28/18   Lelon Perla, MD  diclofenac sodium (VOLTAREN) 1 % GEL Apply 2 g topically 4 (four) times daily. Use as needed for joint pain.  Max 32g/day total 01/09/19   Copland, Gay Filler, MD  doxycycline (VIBRA-TABS) 100 MG tablet TAKE ONE TABLET BY MOUTH TWICE A DAY 12/29/18   Michel Bickers, MD  furosemide (LASIX) 20 MG tablet TAKE ONE TABLET BY MOUTH DAILY 07/28/18   Lelon Perla, MD  lisinopril (PRINIVIL,ZESTRIL) 40 MG tablet Take 1 tablet (40 mg total) by mouth daily. 06/13/18 09/11/18  Lelon Perla, MD  Magnesium Oxide (MAG-OXIDE) 200 MG TABS Take 200 mg by mouth every morning.     [provider]  mesalamine (LIALDA) 1.2 g EC tablet Take 2.4 g by mouth daily with breakfast. 04/11/16 07/22/18  [provider]  Multiple Vitamin  (MULITIVITAMIN WITH MINERALS) TABS Take 1 tablet by mouth daily.    [provider]  naproxen sodium (ALEVE) 220 MG tablet Take 220 mg by mouth as needed (pain).     [provider]  oxyCODONE (OXY IR/ROXICODONE) 5 MG immediate release tablet Take 1 tablet (5 mg total) by mouth every 4 (four) hours as needed for breakthrough pain. 07/25/18   Shelly Coss, MD  potassium chloride SA (K-DUR) 20 MEQ tablet TAKE ONE TABLET BY MOUTH DAILY 03/02/19   Lelon Perla, MD  predniSONE (DELTASONE) 20 MG tablet Take 2 pills (40mg ) for 3 days, then 20 mg for 4 days for hand pain 11/27/18   Copland, Gay Filler, MD  Probiotic Product (PROBIOTIC DAILY PO) Take 1 capsule by mouth daily.    [provider]  rifampin (RIFADIN) 300 MG capsule Take 300 mg by mouth 2 (two) times daily. 02/28/18   [provider]  traMADol (ULTRAM) 50 MG tablet Take 1 tablet (50 mg total) by mouth 2 (two) times daily. 03/11/19   Pete Pelt, PA-C    Physical Exam: Vitals:   04/01/19 1045 04/01/19 1200 04/01/19 1530 04/01/19 1545  BP: (!) 113/91     Pulse: 85 72 80 82  Resp: 19 19 18 19   Temp:      SpO2: 96% 95% 95% 95%      General:  Appears calm and comfortable and is NAD  Eyes:  PERRL, EOMI, normal lids, iris; significant periorbital ecchymosis on the right      ENT:  grossly normal hearing, lips & tongue, mmm; artificial dentition  Neck:  no LAD, masses or thyromegaly  Cardiovascular:  RRR, no m/r/g. No LE edema.   Respiratory:   CTA bilaterally with no wheezes/rales/rhonchi.  Normal respiratory effort.  Abdomen:  soft, NT, ND, NABS  Back:   normal alignment, no CVAT  Skin:  Left knee chronic ulceration  Musculoskeletal:  Marked chronic deformity of left knee       Psychiatric:  grossly normal mood and affect, speech fluent and appropriate, AOx3  Neurologic:  CN 2-12 grossly intact, moves all extremities in coordinated fashion other than L knee, sensation  intact    Radiological Exams on Admission: Dg Shoulder Right  Result Date: 04/01/2019 CLINICAL DATA:  Fall EXAM: RIGHT SHOULDER - 2+ VIEW COMPARISON:  None. FINDINGS: Examination is limited by positioning and provided views; all views provided for review are significantly axillary and therefore the glenohumeral joint is not well evaluated. There is no obvious fracture or dislocation. There is mild acromioclavicular arthrosis. The partially imaged right chest is unremarkable. IMPRESSION: Examination is limited by positioning and provided views; all views provided for review are significantly axillary and therefore the glenohumeral joint is not well evaluated. There is no obvious fracture or dislocation. There is mild acromioclavicular arthrosis. Electronically Signed   By: Eddie Candle M.D.   On: 04/01/2019 09:56   Ct Head Wo Contrast  Result Date: 04/01/2019 CLINICAL DATA:  78 year old female with head, face and neck injury from fall. Initial encounter. EXAM: CT HEAD WITHOUT CONTRAST CT MAXILLOFACIAL WITHOUT CONTRAST  CT CERVICAL SPINE WITHOUT CONTRAST TECHNIQUE: Multidetector CT imaging of the head, cervical spine, and maxillofacial structures were performed using the standard protocol without intravenous contrast. Multiplanar CT image reconstructions of the cervical spine and maxillofacial structures were also generated. COMPARISON:  03/04/2016 head CT FINDINGS: CT HEAD FINDINGS Brain: No evidence of acute infarction, hemorrhage, hydrocephalus, extra-axial collection or mass lesion/mass effect. 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 Vascular: Carotid and vertebral atherosclerotic calcifications noted. Skull: Normal. Negative for fracture or focal lesion. Other: RIGHT forehead soft tissue swelling noted. CT MAXILLOFACIAL FINDINGS Osseous: No fracture or mandibular dislocation. No destructive process. Orbits:  Negative. No traumatic or inflammatory finding. Sinuses: Clear. Soft tissues: RIGHT preseptal facial and forehead soft tissue swelling noted. CT CERVICAL SPINE FINDINGS Alignment: Normal. Skull base and vertebrae: No acute fracture. No primary bone lesion or focal pathologic process. Soft tissues and spinal canal: No prevertebral fluid or swelling. No visible canal hematoma. Disc levels: Mild multilevel degenerative disc disease and moderate multilevel facet arthropathy noted. Upper chest: Emphysema changes identified. No acute abnormality. Minimal LEFT apical scarring noted. Other: None IMPRESSION: 1. No evidence of acute intracranial abnormality. Atrophy, chronic small-vessel white matter ischemic changes and remote infarcts. 2. RIGHT facial/forehead soft tissue swelling without fracture. 3. No static evidence of acute injury to the cervical spine. Multilevel degenerative changes. 4.  Emphysema (ICD10-J43.9). Electronically Signed   By: Margarette Canada M.D.   On: 04/01/2019 10:12   Ct Cervical Spine Wo Contrast  Result Date: 04/01/2019 CLINICAL DATA:  78 year old female with head, face and neck injury from fall. Initial encounter. EXAM: CT HEAD WITHOUT CONTRAST CT MAXILLOFACIAL WITHOUT CONTRAST CT CERVICAL SPINE WITHOUT CONTRAST TECHNIQUE: Multidetector CT imaging of the head, cervical spine, and maxillofacial structures were performed using the standard protocol without intravenous contrast. Multiplanar CT image reconstructions of the cervical spine and maxillofacial structures were also generated. COMPARISON:  03/04/2016 head CT FINDINGS: CT HEAD FINDINGS Brain: No evidence of acute infarction, hemorrhage, hydrocephalus, extra-axial collection or mass lesion/mass effect. 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 Vascular: Carotid and vertebral atherosclerotic calcifications noted. Skull: Normal. Negative for fracture  or focal lesion. Other: RIGHT forehead soft tissue swelling noted. CT MAXILLOFACIAL FINDINGS Osseous: No fracture or mandibular dislocation. No destructive process. Orbits: Negative. No traumatic or inflammatory finding. Sinuses: Clear. Soft tissues: RIGHT preseptal facial and forehead soft tissue swelling noted. CT CERVICAL SPINE FINDINGS Alignment: Normal. Skull base and vertebrae: No acute fracture. No primary bone lesion or focal pathologic process. Soft tissues and spinal canal: No prevertebral fluid or swelling. No visible canal hematoma. Disc levels: Mild multilevel degenerative disc disease and moderate multilevel facet arthropathy noted. Upper chest: Emphysema changes identified. No acute abnormality. Minimal LEFT apical scarring noted. Other: None IMPRESSION: 1. No evidence of acute intracranial abnormality. Atrophy, chronic small-vessel white matter ischemic changes and remote infarcts. 2. RIGHT facial/forehead soft tissue swelling without fracture. 3. No static evidence of acute injury to the cervical spine. Multilevel degenerative changes. 4.  Emphysema (ICD10-J43.9). Electronically Signed   By: Margarette Canada M.D.   On: 04/01/2019 10:12   Dg Chest Portable 1 View  Result Date: 04/01/2019 CLINICAL DATA:  Leukocytosis. Fell. EXAM: PORTABLE CHEST 1 VIEW COMPARISON:  06/27/2016. FINDINGS: Stable mildly enlarged cardiac silhouette and tortuous and calcified thoracic aorta. The lungs are mildly hyperexpanded with mildly prominent interstitial markings. Diffuse osteopenia. Mild bilateral shoulder  degenerative changes. Dense bilateral carotid artery calcifications. IMPRESSION: 1. No acute abnormality. 2. Stable mild cardiomegaly and mild changes of COPD. 3. Dense bilateral carotid artery atheromatous calcifications. Electronically Signed   By: Claudie Revering M.D.   On: 04/01/2019 11:00   Ct Maxillofacial Wo Contrast  Result Date: 04/01/2019 CLINICAL DATA:  78 year old female with head, face and neck injury  from fall. Initial encounter. EXAM: CT HEAD WITHOUT CONTRAST CT MAXILLOFACIAL WITHOUT CONTRAST CT CERVICAL SPINE WITHOUT CONTRAST TECHNIQUE: Multidetector CT imaging of the head, cervical spine, and maxillofacial structures were performed using the standard protocol without intravenous contrast. Multiplanar CT image reconstructions of the cervical spine and maxillofacial structures were also generated. COMPARISON:  03/04/2016 head CT FINDINGS: CT HEAD FINDINGS Brain: No evidence of acute infarction, hemorrhage, hydrocephalus, extra-axial collection or mass lesion/mass effect. 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 Vascular: Carotid and vertebral atherosclerotic calcifications noted. Skull: Normal. Negative for fracture or focal lesion. Other: RIGHT forehead soft tissue swelling noted. CT MAXILLOFACIAL FINDINGS Osseous: No fracture or mandibular dislocation. No destructive process. Orbits: Negative. No traumatic or inflammatory finding. Sinuses: Clear. Soft tissues: RIGHT preseptal facial and forehead soft tissue swelling noted. CT CERVICAL SPINE FINDINGS Alignment: Normal. Skull base and vertebrae: No acute fracture. No primary bone lesion or focal pathologic process. Soft tissues and spinal canal: No prevertebral fluid or swelling. No visible canal hematoma. Disc levels: Mild multilevel degenerative disc disease and moderate multilevel facet arthropathy noted. Upper chest: Emphysema changes identified. No acute abnormality. Minimal LEFT apical scarring noted. Other: None IMPRESSION: 1. No evidence of acute intracranial abnormality. Atrophy, chronic small-vessel white matter ischemic changes and remote infarcts. 2. RIGHT facial/forehead soft tissue swelling without fracture. 3. No static evidence of acute injury to the cervical spine. Multilevel degenerative changes. 4.  Emphysema (ICD10-J43.9). Electronically Signed    By: Margarette Canada M.D.   On: 04/01/2019 10:12    EKG: Independently reviewed.  NSR with rate 74; no evidence of acute ischemia; NSCSLT   Labs on Admission: I have personally reviewed the available labs and imaging studies at the time of the admission.  Pertinent labs:   Glucose 136 BUN 28/Creatinine 1.79/GFR 27; 20/0.87/63 in 4/20 Albumin 3.0 AST 52/ALT 24/Bili 1.7 CK 457 WBC 20.3 Platelets 121 COVID pending   Assessment/Plan Principal Problem:   Syncope Active Problems:   Hyperlipidemia   Essential hypertension   Chronic infection of prosthetic knee (HCC)   Chronic diarrhea    Syncope:  -Patient with apparent syncope overnight - she is unsure exactly what happened but woke up hours later in the bathroom floor with a significant periorbital ecchymosis -She has chronic diarrhea and has worsening diarrhea yesterday, now with AKI; perhaps this was hypovolemic in nature -By the [facility] syncope rule, the patient is at low risk for serious outcome; however, will observe overnight. -Will monitor on telemetry -Orthostatic vital signs in AM -Neuro checks  -PT/OT eval and treat -Patient may have underlying dementia - appeared to have mild cognitive impairment at the time of my evaluation and does report some difficulty with short-term memory  AKI -Patient with baseline creatinine 0.87 in April, now 1.8 -Mildly elevated CK -Very likely associated with prerenal azotemia in the setting of excessive GI losses, although there may be an ATN component as well -Will rehydrate and monitor for now  Diarrhea -She describes long-standing diarrhea with BMs immediately upon wakening and after each meal -Takes mesalamine, will continue -  Her diarrhea was worse for the last few days -C diff pending -Start empiric oral Vanc for this, as per Dr. Megan Salon  Knee infection -Patient developed a smoldering prosthetic knee infection that finally came under control 2-3 years ago -She  continues to see Dr. Megan Salon and I called to discuss the patient with him -Broke through single dose antibiotic a year ago, on double therapy now -Continue antibiotics for now - both Doxy and Rifampin -Dr. Megan Salon will arrange to see her within a week -Wound care consult  HTN -Continue Norvasc, Coreg -Hold Lisinopril given AKI  HLD -Continue Lipitor    Note: This patient has been tested and is negative for the novel coronavirus COVID-19.  DVT prophylaxis:  SCDs Code Status:  DNR - confirmed with patient Family Communication: None present Disposition Plan:  Home once clinically improved Consults called: ID  Admission status: It is my clinical opinion that referral for OBSERVATION is reasonable and necessary in this patient based on the above information provided. The aforementioned taken together are felt to place the patient at high risk for further clinical deterioration. However it is anticipated that the patient may be medically stable for discharge from the hospital within 24 to 48 hours.     Karmen Bongo MD [facility]   How to contact the [facility] Attending or Consulting provider [place] or covering provider during after hours [place], for this patient?  1. Check the care team in [facility] and look for a) attending/consulting [facility] provider listed and b) the [facility] team listed 2. Log into [email or web address] and use [place]'s universal password to access. If you do not have the password, please contact the hospital operator. 3. Locate the [facility] provider you are looking for under [facility] and page to a number that you can be directly reached. 4. If you still have difficulty reaching the provider, please page the [facility] (Director on Call) for the Hospitalists listed on amion for assistance.   04/01/2019, 5:47 PM

## 2019-04-01 NOTE — ED Notes (Signed)
Attempted report 

## 2019-04-01 NOTE — Consult Note (Signed)
[place] Nurse wound consult note Patient receiving care in [facility] ED 016. Reason for Consult: chronic, non-healing knee wound Consult completed remotely via Telehealth after review of notes and images.  Patient is followed by Dr. Erlinda Hong. Last note I could find related to the wound was from Erskine Emery, Orthopedic PA on 03/11/19 at 1:45 p.m. His note outlines the care to the area, which I am continuing with my orders. Wound type: from notes reviewed and problem list, appears related to knee hardware placed in March of 2017 Measurement: To be provided by the bedside RN in the flowsheet section Wound bed: To be provided by the bedside RN in the flowsheet section Drainage (amount, consistency, odor) To be provided by the bedside RN in the flowsheet section Periwound: intact Dressing procedure/placement/frequency:  Wash knee wound with antibacterial soap. Pat dry. Apply bactroban, cover with Telfa pad, wrap in kerlex. Change daily. Monitor the wound area(s) for worsening of condition such as: Signs/symptoms of infection,  Increase in size,  Development of or worsening of odor, Development of pain, or increased pain at the affected locations.  Notify the medical team if any of these develop.  Thank you for the consult. [place] nurse will not follow at this time.  Please re-consult the [place] team if needed.  Val Riles, RN, MSN, CWOCN, CNS-BC, pager [phone]

## 2019-04-01 NOTE — ED Notes (Signed)
Patient transported to X-ray 

## 2019-04-01 NOTE — ED Notes (Signed)
Dinner tray ordered.

## 2019-04-01 NOTE — ED Provider Notes (Signed)
[facility] EMERGENCY DEPARTMENT Provider Note   CSN: [id number] Arrival date & time: 04/01/19  0844     History   Chief Complaint Chief Complaint  Patient presents with   Fall    HPI [name] is a 78 y.o. female.     HPI   78yo female presents with fall.  Reports having excessive bowel movements last night. Doesn't thinks she passed out but not sure. Thinks she went to sleep. Reports has hx of loose stool but had more last night, and slept on the bathroom floor from 9PM-4AM. She then woke up and crawled to the bedroom but couldn't get up. Just prior to arrival called for help.   Denies headache, facial pain, no chest pain, no abdominal pain, no extremity injuries. Chronic knee pain.  Lives at home alone.    Past Medical History:  Diagnosis Date   Arthritis    CAD (coronary artery disease)    a. 02/2007 s/p PCI/DES ot LCX and RCA;  b. 07/2014 MV: no ischemia/infarct, EF 68%.   Carotid arterial disease ([place])    a. 05/2015 Carotid U/S: RICA 9-56%, LICA 38-75%, RECA 6-43%, LECA >50% - overall stable, f/u 1 yr.   Complication of anesthesia    Three days after procedure pt statrted to suffer with memoy loss that took about 4-5 days to come back according to [name]."   Dilated cardiomyopathy ([place])    a. 05/2005 Echo: EF 65%; b. 01/22/2016 Echo: EF 25-30% (in setting of admission for urosepsis w/ elev trop); c. 01/26/2016 Echo: EF 35-40%, inf, distal apical, apical, and mid to distal septal HK-->felt to be 2/2 stress induced CM.   Heart murmur    History of bronchitis    Hyperlipidemia    Hypertension    Hypertensive heart disease    Memory impairment    MRSA (methicillin resistant staph aureus) culture positive    Nephrolithiasis    a. 12/2015 UVJ stone w/ hydroureteronephrosis req nephrostomy tube placement.   Osteoarthritis    a. 09/2949 s/p L TKA complicated by infections req skin grafting.   PVD (peripheral vascular disease)  ([place])    a. Bilat common iliac dzs, nl ABI's --> med rx.   Sepsis ([place])    a. 12/2015 admitted with Proteus mirabilis urosepsis/bacteremia.   Wears glasses     Patient Active Problem List   Diagnosis Date Noted   Closed fracture of multiple pubic rami ([place]) 07/22/2018   Closed fracture of right superior rim of pubis ([place])    Lateral dislocation of left patella 01/15/2017   Unilateral primary osteoarthritis, right knee 01/15/2017   Osteoporosis 11/26/2016   Acute pain of left knee 08/27/2016   Chronic diarrhea 08/07/2016   Left displaced femoral neck fracture ([place]) 06/26/2016   MRSA (methicillin resistant Staphylococcus aureus) infection 06/26/2016   Hypertensive heart disease    Hydronephrosis    Obstructive uropathy    Abnormal echocardiogram 01/23/2016   Pressure ulcer 01/21/2016   Chronic infection of prosthetic knee ([place]) 10/12/2015   AAA 01/19/2009   ILIAC ARTERY ANEURYSM 01/19/2009   Hyperlipidemia 01/14/2009   TOBACCO ABUSE 01/14/2009   Essential hypertension 01/14/2009   Coronary atherosclerosis 01/14/2009   Cerebrovascular disease 01/14/2009   Peripheral vascular disease ([place]) 01/14/2009   CHRONIC OBSTRUCTIVE PULMONARY DISEASE 01/14/2009    Past Surgical History:  Procedure Laterality Date   ABDOMINAL HYSTERECTOMY     APPLICATION OF A-CELL OF EXTREMITY Left 12/08/2015   Procedure: APPLICATION OF A-CELL  OF knee;  Surgeon: Loel Lofty [name], DO;  Location: [place];  Service: Plastics;  Laterality: Left;   APPLICATION OF WOUND VAC Left 11/16/2015   Procedure: APPLICATION OF WOUND VAC;  Surgeon: Loel Lofty [name], DO;  Location: [place];  Service: Plastics;  Laterality: Left;   APPLICATION OF WOUND VAC Left 12/08/2015   Procedure: APPLICATION OF WOUND VAC  AND CAST to knee;  Surgeon: Loel Lofty [name], DO;  Location: [place];  Service: Plastics;  Laterality: Left;   COLONOSCOPY     CORONARY ANGIOPLASTY  07   3 stents placed    ESOPHAGOGASTRODUODENOSCOPY     EYE SURGERY Bilateral    cataracts   FREE FLAP TO EXTREMITY Left 11/16/2015   Procedure: FREE FLAP TO EXTREMITY;  Surgeon: Loel Lofty [name], DO;  Location: [place];  Service: Plastics;  Laterality: Left;   I&D EXTREMITY Left 10/18/2015   Procedure: Left Knee Washout, Reclosure;  Surgeon: Newt Minion, MD;  Location: [place];  Service: Orthopedics;  Laterality: Left;   I&D EXTREMITY Left 12/08/2015   Procedure: IRRIGATION AND DEBRIDEMENT knee;  Surgeon: Loel Lofty [name], DO;  Location: [place];  Service: Plastics;  Laterality: Left;   I&D KNEE WITH POLY EXCHANGE Left 10/12/2015   Procedure: Irrigation and Debridement , Poly Exchange, Antibiotic Beads Left Knee;  Surgeon: Newt Minion, MD;  Location: [place];  Service: Orthopedics;  Laterality: Left;   INCISION AND DRAINAGE OF WOUND Left 01/04/2016   Procedure: DEBRIDEMENT OF LEFT KNEE WOUND WITH ACELL AND WOUND VAC PLACEMENT;  Surgeon: Loel Lofty [name], DO;  Location: [place];  Service: Plastics;  Laterality: Left;   IR GENERIC HISTORICAL  05/11/2016   IR FLUORO GUIDE CV MIDLINE PICC RIGHT 05/11/2016 Sandi Mariscal, MD [facility]   IR GENERIC HISTORICAL  05/11/2016   IR US GUIDE VASC ACCESS RIGHT 05/11/2016 Sandi Mariscal, MD [facility]   KNEE CLOSED REDUCTION Left 01/16/2017   Procedure: CLOSED MANIPULATION LEFT KNEE;  Surgeon: Leandrew Koyanagi, MD;  Location: [place];  Service: Orthopedics;  Laterality: Left;   KNEE SURGERY     denies   SKIN SPLIT GRAFT Left 11/16/2015   Procedure: LEFT GASTROC MUSCLE FLAP WITH SKIN GRAFT SPLIT THICKNESS AND VAC PLACEMENT;  Surgeon: Loel Lofty [name], DO;  Location: [place];  Service: Plastics;  Laterality: Left;   TOTAL HIP ARTHROPLASTY Right 09/10/2012   Dr Sharol Given   TOTAL HIP ARTHROPLASTY Right 09/10/2012   Procedure: TOTAL HIP ARTHROPLASTY;  Surgeon: Newt Minion, MD;  Location: [place];  Service: Orthopedics;  Laterality: Right;  Right Total Hip  Arthroplasty   TOTAL HIP ARTHROPLASTY Left 06/27/2016   Procedure: LEFT TOTAL HIP ARTHROPLASTY ANTERIOR APPROACH;  Surgeon: Leandrew Koyanagi, MD;  Location: [place];  Service: Orthopedics;  Laterality: Left;   TOTAL KNEE ARTHROPLASTY Left 09/28/2015   Procedure: TOTAL KNEE ARTHROPLASTY;  Surgeon: Newt Minion, MD;  Location: [place];  Service: Orthopedics;  Laterality: Left;     OB History   No obstetric history on file.      Home Medications    Prior to Admission medications   Medication Sig Start Date End Date Taking? Authorizing Provider  amLODipine (NORVASC) 10 MG tablet Take 1 tablet (10 mg total) by mouth daily. 07/26/18   Shelly Coss, MD  amLODipine (NORVASC) 5 MG tablet TAKE ONE TABLET BY MOUTH DAILY 07/28/18   Lelon Perla, MD  aspirin EC 81 MG tablet Take 81 mg by mouth  daily.    [provider]  atorvastatin (LIPITOR) 40 MG tablet TAKE ONE TABLET BY MOUTH DAILY 03/02/19   Lelon Perla, MD  Calcium Carbonate-Vit D-Min (CALCIUM 1200 PO) Take 1 tablet by mouth daily.     [provider]  carvedilol (COREG) 12.5 MG tablet TAKE ONE TABLET BY MOUTH TWICE A DAY 07/28/18   Lelon Perla, MD  diclofenac sodium (VOLTAREN) 1 % GEL Apply 2 g topically 4 (four) times daily. Use as needed for joint pain.  Max 32g/day total 01/09/19   Copland, Gay Filler, MD  doxycycline (VIBRA-TABS) 100 MG tablet TAKE ONE TABLET BY MOUTH TWICE A DAY 12/29/18   Michel Bickers, MD  furosemide (LASIX) 20 MG tablet TAKE ONE TABLET BY MOUTH DAILY 07/28/18   Lelon Perla, MD  lisinopril (PRINIVIL,ZESTRIL) 40 MG tablet Take 1 tablet (40 mg total) by mouth daily. 06/13/18 09/11/18  Lelon Perla, MD  Magnesium Oxide (MAG-OXIDE) 200 MG TABS Take 200 mg by mouth every morning.     [provider]  mesalamine (LIALDA) 1.2 g EC tablet Take 2.4 g by mouth daily with breakfast. 04/11/16 07/22/18  [provider]  Multiple Vitamin (MULITIVITAMIN WITH MINERALS) TABS Take 1  tablet by mouth daily.    [provider]  naproxen sodium (ALEVE) 220 MG tablet Take 220 mg by mouth as needed (pain).     [provider]  oxyCODONE (OXY IR/ROXICODONE) 5 MG immediate release tablet Take 1 tablet (5 mg total) by mouth every 4 (four) hours as needed for breakthrough pain. 07/25/18   Shelly Coss, MD  potassium chloride SA (K-DUR) 20 MEQ tablet TAKE ONE TABLET BY MOUTH DAILY 03/02/19   Lelon Perla, MD  predniSONE (DELTASONE) 20 MG tablet Take 2 pills (40mg ) for 3 days, then 20 mg for 4 days for hand pain 11/27/18   Copland, Gay Filler, MD  Probiotic Product (PROBIOTIC DAILY PO) Take 1 capsule by mouth daily.    [provider]  rifampin (RIFADIN) 300 MG capsule Take 300 mg by mouth 2 (two) times daily. 02/28/18   [provider]  traMADol (ULTRAM) 50 MG tablet Take 1 tablet (50 mg total) by mouth 2 (two) times daily. 03/11/19   Pete Pelt, PA-C    Family History Family History  Problem Relation Age of Onset   Breast cancer Mother    Leukemia Father    Stroke Sister    Bone cancer Other     Social History Social History   Tobacco Use   Smoking status: Former Smoker    Packs/day: 0.50    Years: 50.00    Pack years: 25.00    Types: Cigarettes    Quit date: 09/28/2015    Years since quitting: 3.5   Smokeless tobacco: Never Used   Tobacco comment: quit 09/27/15  Substance Use Topics   Alcohol use: No   Drug use: No     Allergies   Patient has no known allergies.   Review of Systems Review of Systems  Constitutional: Negative for fever.  HENT: Negative for sore throat.   Eyes: Negative for visual disturbance.  Respiratory: Negative for cough and shortness of breath.   Cardiovascular: Negative for chest pain.  Gastrointestinal: Negative for abdominal pain, anal bleeding, blood in stool, diarrhea (loose but "wouldn't call it diarrhea"), nausea and vomiting.  Genitourinary: Positive for dysuria (maybe one  month). Negative for difficulty urinating.  Musculoskeletal: Positive for arthralgias (chronic knee pain). Negative for  back pain and neck pain.  Skin: Negative for rash.  Neurological: Negative for facial asymmetry, speech difficulty, weakness, light-headedness, numbness and headaches. Syncope: not sure.     Physical Exam Updated Vital Signs BP (!) 113/91    Pulse 85    Temp 98.2 F (36.8 C)    Resp 19    SpO2 96%   Physical Exam Vitals signs and nursing note reviewed.  Constitutional:      General: She is not in acute distress.    Appearance: She is well-developed. She is not diaphoretic.  HENT:     Head: Normocephalic.     Comments: Right periorbital hematoma Eyes:     Conjunctiva/sclera: Conjunctivae normal.  Neck:     Musculoskeletal: Normal range of motion.  Cardiovascular:     Rate and Rhythm: Normal rate and regular rhythm.     Heart sounds: Normal heart sounds. No murmur. No friction rub. No gallop.   Pulmonary:     Effort: Pulmonary effort is normal. No respiratory distress.     Breath sounds: Normal breath sounds. No wheezing or rales.  Abdominal:     General: There is no distension.     Palpations: Abdomen is soft.     Tenderness: There is no abdominal tenderness. There is no guarding.  Musculoskeletal:        General: No tenderness.     Cervical back: She exhibits no bony tenderness.     Thoracic back: She exhibits no bony tenderness.     Lumbar back: She exhibits no bony tenderness.  Skin:    General: Skin is warm and dry.     Findings: No erythema or rash.  Neurological:     Mental Status: She is alert and oriented to person, place, and time.     Comments: Difficulty lifting right arm for finger to nose on right, reports chronic due to arthritis Mild weakness left leg which she reports is chronic since prior knee surgeries      ED Treatments / Results  Labs (all labs ordered are listed, but only abnormal results are displayed) Labs Reviewed  CBC  WITH DIFFERENTIAL/PLATELET - Abnormal; Notable for the following components:      Result Value   WBC 20.3 (*)    RBC 3.47 (*)    MCV 104.0 (*)    MCH 38.6 (*)    MCHC 37.1 (*)    Platelets 121 (*)    Neutro Abs 18.7 (*)    Lymphs Abs 0.0 (*)    Basophils Absolute 0.2 (*)    Abs Immature Granulocytes 0.80 (*)    All other components within normal limits  COMPREHENSIVE METABOLIC PANEL - Abnormal; Notable for the following components:   Glucose, Bld 136 (*)    BUN 28 (*)    Creatinine, Ser 1.79 (*)    Total Protein 6.0 (*)    Albumin 3.0 (*)    AST 52 (*)    Total Bilirubin 1.7 (*)    GFR calc non Af Amer 27 (*)    GFR calc Af Amer 31 (*)    All other components within normal limits  CK - Abnormal; Notable for the following components:   Total CK 457 (*)    All other components within normal limits  URINE CULTURE  C DIFFICILE QUICK SCREEN W PCR REFLEX  SARS CORONAVIRUS 2 (TAT 6-24 HRS)  URINALYSIS, ROUTINE W REFLEX MICROSCOPIC    EKG EKG Interpretation  Date/Time:  Wednesday April 01 2019 09:01:59  EDT Ventricular Rate:  74 PR Interval:    QRS Duration: 78 QT Interval:  404 QTC Calculation: 449 R Axis:   33 Text Interpretation:  Sinus rhythm No significant change since last tracing Confirmed by Gareth Morgan [phone]) on 04/01/2019 10:35:56 AM   Radiology Dg Shoulder Right  Result Date: 04/01/2019 CLINICAL DATA:  Fall EXAM: RIGHT SHOULDER - 2+ VIEW COMPARISON:  None. FINDINGS: Examination is limited by positioning and provided views; all views provided for review are significantly axillary and therefore the glenohumeral joint is not well evaluated. There is no obvious fracture or dislocation. There is mild acromioclavicular arthrosis. The partially imaged right chest is unremarkable. IMPRESSION: Examination is limited by positioning and provided views; all views provided for review are significantly axillary and therefore the glenohumeral joint is not well evaluated.  There is no obvious fracture or dislocation. There is mild acromioclavicular arthrosis. Electronically Signed   By: Eddie Candle M.D.   On: 04/01/2019 09:56   Ct Head Wo Contrast  Result Date: 04/01/2019 CLINICAL DATA:  78 year old female with head, face and neck injury from fall. Initial encounter. EXAM: CT HEAD WITHOUT CONTRAST CT MAXILLOFACIAL WITHOUT CONTRAST CT CERVICAL SPINE WITHOUT CONTRAST TECHNIQUE: Multidetector CT imaging of the head, cervical spine, and maxillofacial structures were performed using the standard protocol without intravenous contrast. Multiplanar CT image reconstructions of the cervical spine and maxillofacial structures were also generated. COMPARISON:  03/04/2016 head CT FINDINGS: CT HEAD FINDINGS Brain: No evidence of acute infarction, hemorrhage, hydrocephalus, extra-axial collection or mass lesion/mass effect. 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 Vascular: Carotid and vertebral atherosclerotic calcifications noted. Skull: Normal. Negative for fracture or focal lesion. Other: RIGHT forehead soft tissue swelling noted. CT MAXILLOFACIAL FINDINGS Osseous: No fracture or mandibular dislocation. No destructive process. Orbits: Negative. No traumatic or inflammatory finding. Sinuses: Clear. Soft tissues: RIGHT preseptal facial and forehead soft tissue swelling noted. CT CERVICAL SPINE FINDINGS Alignment: Normal. Skull base and vertebrae: No acute fracture. No primary bone lesion or focal pathologic process. Soft tissues and spinal canal: No prevertebral fluid or swelling. No visible canal hematoma. Disc levels: Mild multilevel degenerative disc disease and moderate multilevel facet arthropathy noted. Upper chest: Emphysema changes identified. No acute abnormality. Minimal LEFT apical scarring noted. Other: None IMPRESSION: 1. No evidence of acute intracranial abnormality. Atrophy,  chronic small-vessel white matter ischemic changes and remote infarcts. 2. RIGHT facial/forehead soft tissue swelling without fracture. 3. No static evidence of acute injury to the cervical spine. Multilevel degenerative changes. 4.  Emphysema (ICD10-J43.9). Electronically Signed   By: Margarette Canada M.D.   On: 04/01/2019 10:12   Ct Cervical Spine Wo Contrast  Result Date: 04/01/2019 CLINICAL DATA:  78 year old female with head, face and neck injury from fall. Initial encounter. EXAM: CT HEAD WITHOUT CONTRAST CT MAXILLOFACIAL WITHOUT CONTRAST CT CERVICAL SPINE WITHOUT CONTRAST TECHNIQUE: Multidetector CT imaging of the head, cervical spine, and maxillofacial structures were performed using the standard protocol without intravenous contrast. Multiplanar CT image reconstructions of the cervical spine and maxillofacial structures were also generated. COMPARISON:  03/04/2016 head CT FINDINGS: CT HEAD FINDINGS Brain: No evidence of acute infarction, hemorrhage, hydrocephalus, extra-axial collection or mass lesion/mass effect. 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 Vascular: Carotid and vertebral atherosclerotic calcifications noted. Skull: Normal. Negative for fracture or focal lesion. Other: RIGHT forehead soft tissue swelling noted. CT MAXILLOFACIAL FINDINGS Osseous: No  fracture or mandibular dislocation. No destructive process. Orbits: Negative. No traumatic or inflammatory finding. Sinuses: Clear. Soft tissues: RIGHT preseptal facial and forehead soft tissue swelling noted. CT CERVICAL SPINE FINDINGS Alignment: Normal. Skull base and vertebrae: No acute fracture. No primary bone lesion or focal pathologic process. Soft tissues and spinal canal: No prevertebral fluid or swelling. No visible canal hematoma. Disc levels: Mild multilevel degenerative disc disease and moderate multilevel facet arthropathy noted. Upper  chest: Emphysema changes identified. No acute abnormality. Minimal LEFT apical scarring noted. Other: None IMPRESSION: 1. No evidence of acute intracranial abnormality. Atrophy, chronic small-vessel white matter ischemic changes and remote infarcts. 2. RIGHT facial/forehead soft tissue swelling without fracture. 3. No static evidence of acute injury to the cervical spine. Multilevel degenerative changes. 4.  Emphysema (ICD10-J43.9). Electronically Signed   By: Margarette Canada M.D.   On: 04/01/2019 10:12   Dg Chest Portable 1 View  Result Date: 04/01/2019 CLINICAL DATA:  Leukocytosis. Fell. EXAM: PORTABLE CHEST 1 VIEW COMPARISON:  06/27/2016. FINDINGS: Stable mildly enlarged cardiac silhouette and tortuous and calcified thoracic aorta. The lungs are mildly hyperexpanded with mildly prominent interstitial markings. Diffuse osteopenia. Mild bilateral shoulder degenerative changes. Dense bilateral carotid artery calcifications. IMPRESSION: 1. No acute abnormality. 2. Stable mild cardiomegaly and mild changes of COPD. 3. Dense bilateral carotid artery atheromatous calcifications. Electronically Signed   By: Claudie Revering M.D.   On: 04/01/2019 11:00   Ct Maxillofacial Wo Contrast  Result Date: 04/01/2019 CLINICAL DATA:  78 year old female with head, face and neck injury from fall. Initial encounter. EXAM: CT HEAD WITHOUT CONTRAST CT MAXILLOFACIAL WITHOUT CONTRAST CT CERVICAL SPINE WITHOUT CONTRAST TECHNIQUE: Multidetector CT imaging of the head, cervical spine, and maxillofacial structures were performed using the standard protocol without intravenous contrast. Multiplanar CT image reconstructions of the cervical spine and maxillofacial structures were also generated. COMPARISON:  03/04/2016 head CT FINDINGS: CT HEAD FINDINGS Brain: No evidence of acute infarction, hemorrhage, hydrocephalus, extra-axial collection or mass lesion/mass effect. Atrophy, chronic small-vessel white matter ischemic changes and remote bilateral  basal ganglia and RIGHT posterior parietal infarcts noted. Vascular: Carotid and vertebral atherosclerotic calcifications noted. Skull: Normal. Negative for fracture or focal lesion. Other: RIGHT forehead soft tissue swelling noted. CT MAXILLOFACIAL FINDINGS Osseous: No fracture or mandibular dislocation. No destructive process. Orbits: Negative. No traumatic or inflammatory finding. Sinuses: Clear. Soft tissues: RIGHT preseptal facial and forehead soft tissue swelling noted. CT CERVICAL SPINE FINDINGS Alignment: Normal. Skull base and vertebrae: No acute fracture. No primary bone lesion or focal pathologic process. Soft tissues and spinal canal: No prevertebral fluid or swelling. No visible canal hematoma. Disc levels: Mild multilevel degenerative disc disease and moderate multilevel facet arthropathy noted. Upper chest: Emphysema changes identified. No acute abnormality. Minimal LEFT apical scarring noted. Other: None IMPRESSION: 1. No evidence of acute intracranial abnormality. Atrophy, chronic small-vessel white matter ischemic changes and remote infarcts. 2. RIGHT facial/forehead soft tissue swelling without fracture. 3. No static evidence of acute injury to the cervical spine. Multilevel degenerative changes. 4.  Emphysema (ICD10-J43.9). Electronically Signed   By: Margarette Canada M.D.   On: 04/01/2019 10:12    Procedures Procedures (including critical care time)  Medications Ordered in ED Medications  sodium chloride 0.9 % bolus 500 mL (500 mLs Intravenous New Bag/Given 04/01/19 1022)     Initial Impression / Assessment and Plan / ED Course  I have reviewed the triage vital signs and the nursing notes.  Pertinent labs & imaging results that were available during my care  of the patient were reviewed by me and considered in my medical decision making (see chart for details).        79 year old female with a history of coronary artery disease, dilated cardiomyopathy, hypertension, hyperlipidemia,  peripheral vascular disease, carotid artery disease, AAA, iliac artery aneurysm, COPD, history of left knee infection and wound requiring washout, incision and drainage, presents with concern for fall.  Patient presents with likely syncopal event given she does not remember falling or causing the large bruise to the right side of her face.    CT head, maxillofacial and cervical spine showed no evidence of fractures or intracranial bleed.  X-ray of the right shoulder is limited by positioning, however given x-ray we have and history and exam I have low suspicion for acute fracture.  Labs significant for acute kidney injury with previous creatinine of 1.87, now 1.79, likely dehydration.  CK with mild elevation, however not consistent with rhabdomyolysis.  No sign of anemia.  Patient does have leukocytosis. UA pending. Does report increased loose stools from baseline, is on chronic abx, will send CDIff testing, obtain CXR.  Will admit for dehydration, AKI, r/o CDiff/infection, syncope.   Final Clinical Impressions(s) / ED Diagnoses   Final diagnoses:  Leukocytosis, unspecified type  AKI (acute kidney injury) ([place])  Syncope, unspecified syncope type  Contusion of face, initial encounter  Dehydration  Diarrhea of presumed infectious origin    ED Discharge Orders    None       Gareth Morgan, MD 04/01/19 1145

## 2019-04-02 ENCOUNTER — Telehealth: Payer: Self-pay

## 2019-04-02 ENCOUNTER — Other Ambulatory Visit: Payer: Self-pay

## 2019-04-02 DIAGNOSIS — S0011XD Contusion of right eyelid and periocular area, subsequent encounter: Secondary | ICD-10-CM | POA: Diagnosis not present

## 2019-04-02 DIAGNOSIS — R197 Diarrhea, unspecified: Secondary | ICD-10-CM | POA: Diagnosis not present

## 2019-04-02 DIAGNOSIS — R531 Weakness: Secondary | ICD-10-CM | POA: Diagnosis not present

## 2019-04-02 DIAGNOSIS — W19XXXD Unspecified fall, subsequent encounter: Secondary | ICD-10-CM | POA: Diagnosis not present

## 2019-04-02 DIAGNOSIS — R55 Syncope and collapse: Secondary | ICD-10-CM | POA: Diagnosis present

## 2019-04-02 DIAGNOSIS — Z7401 Bed confinement status: Secondary | ICD-10-CM | POA: Diagnosis not present

## 2019-04-02 DIAGNOSIS — K5190 Ulcerative colitis, unspecified, without complications: Secondary | ICD-10-CM | POA: Diagnosis not present

## 2019-04-02 DIAGNOSIS — W19XXXA Unspecified fall, initial encounter: Secondary | ICD-10-CM | POA: Diagnosis not present

## 2019-04-02 DIAGNOSIS — M2550 Pain in unspecified joint: Secondary | ICD-10-CM | POA: Diagnosis not present

## 2019-04-02 DIAGNOSIS — Z66 Do not resuscitate: Secondary | ICD-10-CM | POA: Diagnosis present

## 2019-04-02 DIAGNOSIS — I2510 Atherosclerotic heart disease of native coronary artery without angina pectoris: Secondary | ICD-10-CM | POA: Diagnosis present

## 2019-04-02 DIAGNOSIS — I119 Hypertensive heart disease without heart failure: Secondary | ICD-10-CM | POA: Diagnosis present

## 2019-04-02 DIAGNOSIS — N179 Acute kidney failure, unspecified: Secondary | ICD-10-CM | POA: Diagnosis present

## 2019-04-02 DIAGNOSIS — E860 Dehydration: Secondary | ICD-10-CM | POA: Diagnosis present

## 2019-04-02 DIAGNOSIS — Y92009 Unspecified place in unspecified non-institutional (private) residence as the place of occurrence of the external cause: Secondary | ICD-10-CM | POA: Diagnosis not present

## 2019-04-02 DIAGNOSIS — S81002D Unspecified open wound, left knee, subsequent encounter: Secondary | ICD-10-CM | POA: Diagnosis not present

## 2019-04-02 DIAGNOSIS — T8454XD Infection and inflammatory reaction due to internal left knee prosthesis, subsequent encounter: Secondary | ICD-10-CM | POA: Diagnosis not present

## 2019-04-02 DIAGNOSIS — E785 Hyperlipidemia, unspecified: Secondary | ICD-10-CM | POA: Diagnosis present

## 2019-04-02 DIAGNOSIS — K529 Noninfective gastroenteritis and colitis, unspecified: Secondary | ICD-10-CM | POA: Diagnosis not present

## 2019-04-02 DIAGNOSIS — Z792 Long term (current) use of antibiotics: Secondary | ICD-10-CM | POA: Diagnosis not present

## 2019-04-02 DIAGNOSIS — B9562 Methicillin resistant Staphylococcus aureus infection as the cause of diseases classified elsewhere: Secondary | ICD-10-CM | POA: Diagnosis present

## 2019-04-02 DIAGNOSIS — D696 Thrombocytopenia, unspecified: Secondary | ICD-10-CM | POA: Diagnosis present

## 2019-04-02 DIAGNOSIS — M1990 Unspecified osteoarthritis, unspecified site: Secondary | ICD-10-CM | POA: Diagnosis not present

## 2019-04-02 DIAGNOSIS — Z95820 Peripheral vascular angioplasty status with implants and grafts: Secondary | ICD-10-CM | POA: Diagnosis not present

## 2019-04-02 DIAGNOSIS — B964 Proteus (mirabilis) (morganii) as the cause of diseases classified elsewhere: Secondary | ICD-10-CM | POA: Diagnosis present

## 2019-04-02 DIAGNOSIS — I10 Essential (primary) hypertension: Secondary | ICD-10-CM | POA: Diagnosis not present

## 2019-04-02 DIAGNOSIS — Z955 Presence of coronary angioplasty implant and graft: Secondary | ICD-10-CM | POA: Diagnosis not present

## 2019-04-02 DIAGNOSIS — R011 Cardiac murmur, unspecified: Secondary | ICD-10-CM | POA: Diagnosis not present

## 2019-04-02 DIAGNOSIS — I361 Nonrheumatic tricuspid (valve) insufficiency: Secondary | ICD-10-CM | POA: Diagnosis not present

## 2019-04-02 DIAGNOSIS — I739 Peripheral vascular disease, unspecified: Secondary | ICD-10-CM | POA: Diagnosis present

## 2019-04-02 DIAGNOSIS — Z7982 Long term (current) use of aspirin: Secondary | ICD-10-CM | POA: Diagnosis not present

## 2019-04-02 DIAGNOSIS — S0011XA Contusion of right eyelid and periocular area, initial encounter: Secondary | ICD-10-CM | POA: Diagnosis present

## 2019-04-02 DIAGNOSIS — Z96659 Presence of unspecified artificial knee joint: Secondary | ICD-10-CM | POA: Diagnosis not present

## 2019-04-02 DIAGNOSIS — T8459XS Infection and inflammatory reaction due to other internal joint prosthesis, sequela: Secondary | ICD-10-CM | POA: Diagnosis not present

## 2019-04-02 DIAGNOSIS — I351 Nonrheumatic aortic (valve) insufficiency: Secondary | ICD-10-CM | POA: Diagnosis not present

## 2019-04-02 DIAGNOSIS — Z20828 Contact with and (suspected) exposure to other viral communicable diseases: Secondary | ICD-10-CM | POA: Diagnosis present

## 2019-04-02 DIAGNOSIS — Z90710 Acquired absence of both cervix and uterus: Secondary | ICD-10-CM | POA: Diagnosis not present

## 2019-04-02 DIAGNOSIS — M00062 Staphylococcal arthritis, left knee: Secondary | ICD-10-CM | POA: Diagnosis present

## 2019-04-02 DIAGNOSIS — I6529 Occlusion and stenosis of unspecified carotid artery: Secondary | ICD-10-CM | POA: Diagnosis present

## 2019-04-02 DIAGNOSIS — N390 Urinary tract infection, site not specified: Secondary | ICD-10-CM | POA: Diagnosis present

## 2019-04-02 DIAGNOSIS — E7800 Pure hypercholesterolemia, unspecified: Secondary | ICD-10-CM | POA: Diagnosis not present

## 2019-04-02 DIAGNOSIS — A419 Sepsis, unspecified organism: Secondary | ICD-10-CM | POA: Diagnosis not present

## 2019-04-02 DIAGNOSIS — Z8614 Personal history of Methicillin resistant Staphylococcus aureus infection: Secondary | ICD-10-CM | POA: Diagnosis not present

## 2019-04-02 DIAGNOSIS — I420 Dilated cardiomyopathy: Secondary | ICD-10-CM | POA: Diagnosis present

## 2019-04-02 LAB — BASIC METABOLIC PANEL
Anion gap: 11 (ref 5–15)
BUN: 31 mg/dL — ABNORMAL HIGH (ref 8–23)
CO2: 20 mmol/L — ABNORMAL LOW (ref 22–32)
Calcium: 8.7 mg/dL — ABNORMAL LOW (ref 8.9–10.3)
Chloride: 106 mmol/L (ref 98–111)
Creatinine, Ser: 1.36 mg/dL — ABNORMAL HIGH (ref 0.44–1.00)
GFR calc Af Amer: 43 mL/min — ABNORMAL LOW (ref >60)
GFR calc non Af Amer: 37 mL/min — ABNORMAL LOW (ref >60)
Glucose, Bld: 103 mg/dL — ABNORMAL HIGH (ref 70–99)
Potassium: 3.9 mmol/L (ref 3.5–5.1)
Sodium: 137 mmol/L (ref 135–145)

## 2019-04-02 LAB — CBC
HCT: 34 % — ABNORMAL LOW (ref 36.0–46.0)
Hemoglobin: 12.3 g/dL (ref 12.0–15.0)
MCH: 37.3 pg — ABNORMAL HIGH (ref 26.0–34.0)
MCHC: 36.2 g/dL — ABNORMAL HIGH (ref 30.0–36.0)
MCV: 103 fL — ABNORMAL HIGH (ref 80.0–100.0)
Platelets: 108 10*3/uL — ABNORMAL LOW (ref 150–400)
RBC: 3.3 MIL/uL — ABNORMAL LOW (ref 3.87–5.11)
RDW: 14.7 % (ref 11.5–15.5)
WBC: 15.8 10*3/uL — ABNORMAL HIGH (ref 4.0–10.5)
nRBC: 0 % (ref 0.0–0.2)

## 2019-04-02 LAB — MRSA PCR SCREENING: MRSA by PCR: NEGATIVE

## 2019-04-02 MED ORDER — SODIUM CHLORIDE 0.9 % IV SOLN
INTRAVENOUS | Status: AC
  Administered 2019-04-02: 14:00:00 via INTRAVENOUS

## 2019-04-02 NOTE — Progress Notes (Signed)
Wound to the left knee was cleaned with antibacterial soap, pat dry, bactrim ointment applied and covered with Telfa pad and wrapped with Kerlix.  Dressing change daily per order.

## 2019-04-02 NOTE — Evaluation (Signed)
Physical Therapy Evaluation Patient Details Name: [name] MRN: [id number] DOB: [birth year] Today's Date: 04/02/2019   History of Present Illness  Patient is a 78 year old female who suffered fall at home. She does not recall what happened just found herself on the floor. PMH to include: chronic left knee infection/pain after TKA, B THA, CAD, HTN, OA, PVD.  Clinical Impression  Patient received in bed, nursing and son present. Patient is on enteric precautions for possible Cdiff. Patient initially required min assist with supine to sit. Required assistance to raise trunk and maintain initial sitting balance. Once steady on the side of the bed, patient was able to maintain independent sitting. Transferred with min guard and ambulated 60 feet with RW. Antalgic gait pattern due to left knee pain. Patient demonstrates slow, steady ambulation with no reports of dizziness. She will benefit from continued skilled PT while here to improve strength and safety with mobility.        Follow Up Recommendations Home health PT;Supervision for mobility/OOB    Equipment Recommendations  None recommended by PT    Recommendations for Other Services       Precautions / Restrictions Precautions Precautions: Fall Restrictions Weight Bearing Restrictions: No      Mobility  Bed Mobility Overal bed mobility: Needs Assistance Bed Mobility: Supine to Sit     Supine to sit: Min assist     General bed mobility comments: patient requires assistance to raise trunk to sitting position. Requires assistance to maintain sitting position initially. Posterior leaning, right side leaning.  Transfers Overall transfer level: Needs assistance Equipment used: Rolling walker (2 wheeled) Transfers: Sit to/from Stand Sit to Stand: Min guard            Ambulation/Gait Ambulation/Gait assistance: Min guard Gait Distance (Feet): 60 Feet Assistive device: Rolling walker (2 wheeled) Gait Pattern/deviations:  Step-to pattern;Decreased stance time - left;Decreased weight shift to left;Antalgic Gait velocity: decreased   General Gait Details: Patient ambulates with left knee in flexed position due to chronic knee infection.  Stairs            Wheelchair Mobility    Modified Rankin (Stroke Patients Only)       Balance Overall balance assessment: Needs assistance Sitting-balance support: Feet supported;Single extremity supported Sitting balance-Leahy Scale: Fair     Standing balance support: Bilateral upper extremity supported Standing balance-Leahy Scale: Fair                               Pertinent Vitals/Pain Pain Assessment: Faces Faces Pain Scale: Hurts little more Pain Location: left knee Pain Descriptors / Indicators: Discomfort Pain Intervention(s): Monitored during session;RN gave pain meds during session    Home Living Family/patient expects to be discharged to:: Private residence Living Arrangements: Alone Available Help at Discharge: Family;Available PRN/intermittently Type of Home: House Home Access: Level entry     Home Layout: One level Home Equipment: Walker - 2 wheels;Walker - 4 wheels;Shower seat;Grab bars - tub/shower;Grab bars - toilet;Hand held shower head      Prior Function Level of Independence: Independent with assistive device(s)         Comments: Consistently uses either RW or rollator     Hand Dominance   Dominant Hand: Right    Extremity/Trunk Assessment   Upper Extremity Assessment Upper Extremity Assessment: Overall WFL for tasks assessed    Lower Extremity Assessment Lower Extremity Assessment: Generalized weakness    Cervical /  Trunk Assessment Cervical / Trunk Assessment: Kyphotic  Communication   Communication: No difficulties  Cognition Arousal/Alertness: Awake/alert Behavior During Therapy: WFL for tasks assessed/performed Overall Cognitive Status: Within Functional Limits for tasks assessed                                         General Comments      Exercises     Assessment/Plan    PT Assessment Patient needs continued PT services  PT Problem List Decreased strength;Decreased mobility;Decreased activity tolerance;Decreased balance;Pain;Decreased range of motion       PT Treatment Interventions Therapeutic activities;Gait training;Therapeutic exercise;Patient/family education;Balance training;Functional mobility training    PT Goals (Current goals can be found in the Care Plan section)  Acute Rehab PT Goals Patient Stated Goal: to return home with assistance PT Goal Formulation: With family Time For Goal Achievement: 04/09/19 Potential to Achieve Goals: Good    Frequency Min 2X/week   Barriers to discharge Decreased caregiver support      Co-evaluation               AM-PAC PT "6 Clicks" Mobility  Outcome Measure Help needed turning from your back to your side while in a flat bed without using bedrails?: A Little Help needed moving from lying on your back to sitting on the side of a flat bed without using bedrails?: A Little Help needed moving to and from a bed to a chair (including a wheelchair)?: A Little Help needed standing up from a chair using your arms (e.g., wheelchair or bedside chair)?: A Little Help needed to walk in hospital room?: A Little Help needed climbing 3-5 steps with a railing? : A Lot 6 Click Score: 17    End of Session Equipment Utilized During Treatment: Gait belt Activity Tolerance: Patient limited by fatigue Patient left: in bed;with bed alarm set;with call bell/phone within reach;with family/visitor present Nurse Communication: Mobility status;Other (comment)(pure wick not in place, patient should be able to get up to [facility]) PT Visit Diagnosis: Unsteadiness on feet (R26.81);Muscle weakness (generalized) (M62.81);Difficulty in walking, not elsewhere classified (R26.2);Other abnormalities of gait and mobility  (R26.89);Pain;History of falling (Z91.81) Pain - Right/Left: Left Pain - part of body: Knee    Time: 7793-9030 PT Time Calculation (min) (ACUTE ONLY): 35 min   Charges:   PT Evaluation $PT Eval Moderate Complexity: 1 Mod PT Treatments $Gait Training: 8-22 mins        Rylan Kaufmann, PT, GCS 04/02/19,12:43 PM

## 2019-04-02 NOTE — Progress Notes (Addendum)
PROGRESS NOTE    [name]  ERD:[id number] DOB: 12/25/1940 DOA: 04/01/2019 PCP: Darreld Mclean, MD  Brief Narrative: Chief Complaint: Fall, syncope HPI: [name] is a 78 y.o. 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  Assessment & Plan:   Syncope:  -Without significant prodrome, continue to monitor on telemetry, EKG appears unremarkable, check orthostatic vital signs-may be too late now since has already received fluids -Suspect hypovolemia could have been contributing due to worsening diarrhea and AKI on admission -Physical therapy evaluation -Also check 2D echo for completion  AKI -Patient with baseline creatinine 0.87 in April, now 1.8 -Likely prerenal from worsening diarrhea, improving with hydration, hold ACE inhibitor  Acute on chronic diarrhea -Has frequent diarrhea at baseline possibly malabsorption or from long term antibiotic use, worsened in the last 4 to 5 days -Takes mesalamine chronically this was continued, C. difficile PCR is still pending, on empiric oral vancomycin day 1 -Have significant leukocytosis as well leading to suspicion for Cdiff  Abnormal urinalysis,  -does have some vague urinary symptoms, but not significant -Follow-up urine culture and if C. difficile PCR is negative may need to entertain possibility of UTI  History of knee infection -Has a prosthetic knee infection with MRSA, on chronic suppressive antibiotic therapy -She continues to see Dr. Megan Salon, continue both  doxycycline and rifampin, Dr. Inda Merlin discussed with Dr. Megan Salon yesterday who will arrange follow-up in a week  -Wound care consult  HTN -Continue Norvasc, Coreg -Hold Lisinopril given AKI  HLD -Continue Lipitor   DVT prophylaxis:  SCDs, add lovenox when plts better Code Status:  DNR  Family Communication:  Called and updated son [name] Disposition Plan:  Home once clinically improved  Consultants:   D/w Dr.Campbell   Procedures:   Antimicrobials:    Subjective: -Reports the diarrhea is a little better and feels a little stronger as well today  Objective: Vitals:   04/02/19 0745 04/02/19 0900 04/02/19 1050 04/02/19 1142  BP: 122/77 (!) 153/73 (!) 179/79 (!) 145/79  Pulse: 79  81 77  Resp: 20  20 20   Temp: 98.1 F (36.7 C)  97.7 F (36.5 C)   TempSrc: Oral  Oral   SpO2: 95% 93% 95% 97%  Weight:      Height:        Intake/Output Summary (Last 24 hours) at 04/02/2019 1155 Last data filed at 04/02/2019 0853 Gross per 24 hour  Intake 1422.5 ml  Output 100 ml  Net 1322.5 ml   Filed Weights   04/01/19 1943 04/02/19 0525  Weight: 56.2 kg 58.5 kg    Examination:  General exam: AAO x3, chronically ill-appearing, no distress HEENT, bruising and ecchymosis around right eye  respiratory system: Clear bilaterally Cardiovascular system: S1 & S2 heard, RRR. Gastrointestinal system: Abdomen is nondistended, soft and nontender.Normal bowel sounds heard. Central nervous system: Alert and oriented. No focal neurological deficits. Extremities: No edema, mild swelling around left knee which is chronic per patient report Skin: No rashes, lesions or ulcers Psychiatry: Judgement and insight appear normal.  Mood & affect appropriate.     Data Reviewed:   CBC: Recent Labs  Lab 04/01/19 0933 04/02/19 0809  WBC 20.3* 15.8*  NEUTROABS 18.7*  --   HGB 13.4 12.3  HCT 36.1 34.0*  MCV 104.0* 103.0*  PLT 121* 720*   Basic Metabolic Panel: Recent Labs  Lab 04/01/19 0933  04/02/19 0809  NA 140 137  K 4.0 3.9  CL 104 106  CO2 22 20*  GLUCOSE 136* 103*  BUN 28* 31*  CREATININE 1.79* 1.36*  CALCIUM 9.6 8.7*   GFR: Estimated Creatinine Clearance: 28.7 mL/min (A) (by C-G formula based on SCr of 1.36 mg/dL (H)). Liver Function Tests: Recent Labs  Lab 04/01/19 0933  AST 52*  ALT 24  ALKPHOS 118  BILITOT 1.7*  PROT 6.0*  ALBUMIN 3.0*   No results for input(s): LIPASE, AMYLASE in the last 168 hours. No results for input(s): AMMONIA in the last 168 hours. Coagulation Profile: No results for input(s): INR, PROTIME in the last 168 hours. Cardiac Enzymes: Recent Labs  Lab 04/01/19 0933  CKTOTAL 457*   BNP (last 3 results) No results for input(s): PROBNP in the last 8760 hours. HbA1C: No results for input(s): HGBA1C in the last 72 hours. CBG: No results for input(s): GLUCAP in the last 168 hours. Lipid Profile: No results for input(s): CHOL, HDL, LDLCALC, TRIG, CHOLHDL, LDLDIRECT in the last 72 hours. Thyroid Function Tests: Recent Labs    04/01/19 1540  TSH 1.852   Anemia Panel: No results for input(s): VITAMINB12, FOLATE, FERRITIN, TIBC, IRON, RETICCTPCT in the last 72 hours. Urine analysis:    Component Value Date/Time   COLORURINE AMBER (A) 04/01/2019 1730   APPEARANCEUR CLOUDY (A) 04/01/2019 1730   LABSPEC 1.019 04/01/2019 1730   PHURINE 5.0 04/01/2019 1730   GLUCOSEU NEGATIVE 04/01/2019 1730   HGBUR MODERATE (A) 04/01/2019 1730   BILIRUBINUR NEGATIVE 04/01/2019 1730   KETONESUR NEGATIVE 04/01/2019 1730   PROTEINUR 100 (A) 04/01/2019 1730   UROBILINOGEN 0.2 09/19/2011 1703   NITRITE NEGATIVE 04/01/2019 1730   LEUKOCYTESUR LARGE (A) 04/01/2019 1730   Sepsis Labs: @LABRCNTIP (procalcitonin:4,lacticidven:4)  ) Recent Results (from the past 240 hour(s))  SARS CORONAVIRUS 2 (TAT 6-24 HRS)     Status: None   Collection Time: 04/01/19 11:45 AM  Result Value Ref Range Status   SARS Coronavirus 2 NEGATIVE NEGATIVE Final     Comment: (NOTE) SARS-CoV-2 target nucleic acids are NOT DETECTED. The SARS-CoV-2 RNA is generally detectable in upper and lower respiratory specimens during the acute phase of infection. Negative results do not preclude SARS-CoV-2 infection, do not rule out co-infections with other pathogens, and should not be used as the sole basis for treatment or other patient management decisions. Negative results must be combined with clinical observations, patient history, and epidemiological information. The expected result is Negative. Fact Sheet for Patients: SugarRoll.be Fact Sheet for Healthcare Providers: https://www.woods-mathews.com/ This test is not yet approved or cleared by the [place] FDA and  has been authorized for detection and/or diagnosis of SARS-CoV-2 by FDA under an Emergency Use Authorization (EUA). This EUA will remain  in effect (meaning this test can be used) for the duration of the COVID-19 declaration under Section 56 4(b)(1) of the Act, 21 U.S.C. section 360bbb-3(b)(1), unless the authorization is terminated or revoked sooner. Performed at [facility], [place] [address] [place], [place] [address]   MRSA PCR Screening     Status: None   Collection Time: 04/02/19 12:45 AM  Specimen: Nasopharyngeal  Result Value Ref Range Status   MRSA by PCR NEGATIVE NEGATIVE Final    Comment:        The GeneXpert MRSA Assay (FDA approved for NASAL specimens only), is one component of a comprehensive MRSA colonization surveillance program. It is not intended to diagnose MRSA infection nor to guide or monitor treatment for MRSA infections. Performed at [facility], [place] [address] [place], [place] [address]          Radiology Studies: Dg Shoulder Right  Result Date: 04/01/2019 CLINICAL DATA:  Fall EXAM: RIGHT SHOULDER - 2+ VIEW COMPARISON:  None. FINDINGS: Examination is limited by positioning and provided views;  all views provided for review are significantly axillary and therefore the glenohumeral joint is not well evaluated. There is no obvious fracture or dislocation. There is mild acromioclavicular arthrosis. The partially imaged right chest is unremarkable. IMPRESSION: Examination is limited by positioning and provided views; all views provided for review are significantly axillary and therefore the glenohumeral joint is not well evaluated. There is no obvious fracture or dislocation. There is mild acromioclavicular arthrosis. Electronically Signed   By: Eddie Candle M.D.   On: 04/01/2019 09:56   Ct Head Wo Contrast  Result Date: 04/01/2019 CLINICAL DATA:  78 year old female with head, face and neck injury from fall. Initial encounter. EXAM: CT HEAD WITHOUT CONTRAST CT MAXILLOFACIAL WITHOUT CONTRAST CT CERVICAL SPINE WITHOUT CONTRAST TECHNIQUE: Multidetector CT imaging of the head, cervical spine, and maxillofacial structures were performed using the standard protocol without intravenous contrast. Multiplanar CT image reconstructions of the cervical spine and maxillofacial structures were also generated. COMPARISON:  03/04/2016 head CT FINDINGS: CT HEAD FINDINGS Brain: No evidence of acute infarction, hemorrhage, hydrocephalus, extra-axial collection or mass lesion/mass effect. 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 Vascular: Carotid and vertebral atherosclerotic calcifications noted. Skull: Normal. Negative for fracture or focal lesion. Other: RIGHT forehead soft tissue swelling noted. CT MAXILLOFACIAL FINDINGS Osseous: No fracture or mandibular dislocation. No destructive process. Orbits: Negative. No traumatic or inflammatory finding. Sinuses: Clear. Soft tissues: RIGHT preseptal facial and forehead soft tissue swelling noted. CT CERVICAL SPINE FINDINGS Alignment: Normal. Skull base and vertebrae: No acute  fracture. No primary bone lesion or focal pathologic process. Soft tissues and spinal canal: No prevertebral fluid or swelling. No visible canal hematoma. Disc levels: Mild multilevel degenerative disc disease and moderate multilevel facet arthropathy noted. Upper chest: Emphysema changes identified. No acute abnormality. Minimal LEFT apical scarring noted. Other: None IMPRESSION: 1. No evidence of acute intracranial abnormality. Atrophy, chronic small-vessel white matter ischemic changes and remote infarcts. 2. RIGHT facial/forehead soft tissue swelling without fracture. 3. No static evidence of acute injury to the cervical spine. Multilevel degenerative changes. 4.  Emphysema (ICD10-J43.9). Electronically Signed   By: Margarette Canada M.D.   On: 04/01/2019 10:12   Ct Cervical Spine Wo Contrast  Result Date: 04/01/2019 CLINICAL DATA:  78 year old female with head, face and neck injury from fall. Initial encounter. EXAM: CT HEAD WITHOUT CONTRAST CT MAXILLOFACIAL WITHOUT CONTRAST CT CERVICAL SPINE WITHOUT CONTRAST TECHNIQUE: Multidetector CT imaging of the head, cervical spine, and maxillofacial structures were performed using the standard protocol without intravenous contrast. Multiplanar CT image reconstructions of the cervical spine and maxillofacial structures were also generated. COMPARISON:  03/04/2016 head CT FINDINGS: CT HEAD FINDINGS Brain: No evidence of acute infarction, hemorrhage, hydrocephalus, extra-axial collection or mass lesion/mass effect. Atrophy, chronic small-vessel white matter  ischemic changes and remote bilateral basal ganglia and RIGHT posterior parietal infarcts noted. Vascular: Carotid and vertebral atherosclerotic calcifications noted. Skull: Normal. Negative for fracture or focal lesion. Other: RIGHT forehead soft tissue swelling noted. CT MAXILLOFACIAL FINDINGS Osseous: No fracture or mandibular dislocation. No destructive process. Orbits: Negative. No traumatic or inflammatory finding.  Sinuses: Clear. Soft tissues: RIGHT preseptal facial and forehead soft tissue swelling noted. CT CERVICAL SPINE FINDINGS Alignment: Normal. Skull base and vertebrae: No acute fracture. No primary bone lesion or focal pathologic process. Soft tissues and spinal canal: No prevertebral fluid or swelling. No visible canal hematoma. Disc levels: Mild multilevel degenerative disc disease and moderate multilevel facet arthropathy noted. Upper chest: Emphysema changes identified. No acute abnormality. Minimal LEFT apical scarring noted. Other: None IMPRESSION: 1. No evidence of acute intracranial abnormality. Atrophy, chronic small-vessel white matter ischemic changes and remote infarcts. 2. RIGHT facial/forehead soft tissue swelling without fracture. 3. No static evidence of acute injury to the cervical spine. Multilevel degenerative changes. 4.  Emphysema (ICD10-J43.9). Electronically Signed   By: Margarette Canada M.D.   On: 04/01/2019 10:12   Dg Chest Portable 1 View  Result Date: 04/01/2019 CLINICAL DATA:  Leukocytosis. Fell. EXAM: PORTABLE CHEST 1 VIEW COMPARISON:  06/27/2016. FINDINGS: Stable mildly enlarged cardiac silhouette and tortuous and calcified thoracic aorta. The lungs are mildly hyperexpanded with mildly prominent interstitial markings. Diffuse osteopenia. Mild bilateral shoulder degenerative changes. Dense bilateral carotid artery calcifications. IMPRESSION: 1. No acute abnormality. 2. Stable mild cardiomegaly and mild changes of COPD. 3. Dense bilateral carotid artery atheromatous calcifications. Electronically Signed   By: Claudie Revering M.D.   On: 04/01/2019 11:00   Ct Maxillofacial Wo Contrast  Result Date: 04/01/2019 CLINICAL DATA:  78 year old female with head, face and neck injury from fall. Initial encounter. EXAM: CT HEAD WITHOUT CONTRAST CT MAXILLOFACIAL WITHOUT CONTRAST CT CERVICAL SPINE WITHOUT CONTRAST TECHNIQUE: Multidetector CT imaging of the head, cervical spine, and maxillofacial structures  were performed using the standard protocol without intravenous contrast. Multiplanar CT image reconstructions of the cervical spine and maxillofacial structures were also generated. COMPARISON:  03/04/2016 head CT FINDINGS: CT HEAD FINDINGS Brain: No evidence of acute infarction, hemorrhage, hydrocephalus, extra-axial collection or mass lesion/mass effect. 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 Vascular: Carotid and vertebral atherosclerotic calcifications noted. Skull: Normal. Negative for fracture or focal lesion. Other: RIGHT forehead soft tissue swelling noted. CT MAXILLOFACIAL FINDINGS Osseous: No fracture or mandibular dislocation. No destructive process. Orbits: Negative. No traumatic or inflammatory finding. Sinuses: Clear. Soft tissues: RIGHT preseptal facial and forehead soft tissue swelling noted. CT CERVICAL SPINE FINDINGS Alignment: Normal. Skull base and vertebrae: No acute fracture. No primary bone lesion or focal pathologic process. Soft tissues and spinal canal: No prevertebral fluid or swelling. No visible canal hematoma. Disc levels: Mild multilevel degenerative disc disease and moderate multilevel facet arthropathy noted. Upper chest: Emphysema changes identified. No acute abnormality. Minimal LEFT apical scarring noted. Other: None IMPRESSION: 1. No evidence of acute intracranial abnormality. Atrophy, chronic small-vessel white matter ischemic changes and remote infarcts. 2. RIGHT facial/forehead soft tissue swelling without fracture. 3. No static evidence of acute injury to the cervical spine. Multilevel degenerative changes. 4.  Emphysema (ICD10-J43.9). Electronically Signed   By: Margarette Canada M.D.   On: 04/01/2019 10:12        Scheduled Meds:  amLODipine  10 mg Oral Daily   aspirin EC  81 mg Oral Daily  atorvastatin  40 mg Oral q1800   carvedilol  12.5 mg Oral BID WC    doxycycline  100 mg Oral BID   magnesium oxide  200 mg Oral BH-q7a   mesalamine  2.4 g Oral Q breakfast   multivitamin with minerals  1 tablet Oral Daily   mupirocin cream   Topical Daily   rifampin  300 mg Oral BID   sodium chloride flush  3 mL Intravenous Q12H   traMADol  50 mg Oral Daily   vancomycin  125 mg Oral QID   Continuous Infusions:  lactated ringers 75 mL/hr at 04/01/19 1826     LOS: 0 days    Time spent: 41min    Domenic Polite, MD [facility] Page via [email or web address], password [email or web address] After 7PM please contact night-coverage  04/02/2019, 11:55 AM

## 2019-04-02 NOTE — Telephone Encounter (Signed)
Spoke with son [name] and made him aware of new appointment for [name]. Provided appointment date/time and location. Son verbalized understanding and appreciated call.  Eugenia Mcalpine

## 2019-04-03 ENCOUNTER — Inpatient Hospital Stay (HOSPITAL_COMMUNITY): Payer: Medicare Other

## 2019-04-03 DIAGNOSIS — E7800 Pure hypercholesterolemia, unspecified: Secondary | ICD-10-CM

## 2019-04-03 DIAGNOSIS — I361 Nonrheumatic tricuspid (valve) insufficiency: Secondary | ICD-10-CM

## 2019-04-03 DIAGNOSIS — I351 Nonrheumatic aortic (valve) insufficiency: Secondary | ICD-10-CM

## 2019-04-03 LAB — BASIC METABOLIC PANEL
Anion gap: 9 (ref 5–15)
BUN: 19 mg/dL (ref 8–23)
CO2: 24 mmol/L (ref 22–32)
Calcium: 8.8 mg/dL — ABNORMAL LOW (ref 8.9–10.3)
Chloride: 104 mmol/L (ref 98–111)
Creatinine, Ser: 1.04 mg/dL — ABNORMAL HIGH (ref 0.44–1.00)
GFR calc Af Amer: >60 mL/min (ref >60)
GFR calc non Af Amer: 52 mL/min — ABNORMAL LOW (ref >60)
Glucose, Bld: 97 mg/dL (ref 70–99)
Potassium: 3.8 mmol/L (ref 3.5–5.1)
Sodium: 137 mmol/L (ref 135–145)

## 2019-04-03 LAB — ECHOCARDIOGRAM COMPLETE
Height: 63 in
Weight: 1936 oz

## 2019-04-03 LAB — CBC
HCT: 36.7 % (ref 36.0–46.0)
Hemoglobin: 12.1 g/dL (ref 12.0–15.0)
MCH: 32.9 pg (ref 26.0–34.0)
MCHC: 33 g/dL (ref 30.0–36.0)
MCV: 99.7 fL (ref 80.0–100.0)
Platelets: 114 10*3/uL — ABNORMAL LOW (ref 150–400)
RBC: 3.68 MIL/uL — ABNORMAL LOW (ref 3.87–5.11)
RDW: 13.6 % (ref 11.5–15.5)
WBC: 13.3 10*3/uL — ABNORMAL HIGH (ref 4.0–10.5)
nRBC: 0 % (ref 0.0–0.2)

## 2019-04-03 MED ORDER — CEFAZOLIN SODIUM-DEXTROSE 1-4 GM/50ML-% IV SOLN
1.0000 g | Freq: Three times a day (TID) | INTRAVENOUS | Status: DC
  Administered 2019-04-03 – 2019-04-06 (×10): 1 g via INTRAVENOUS
  Filled 2019-04-03 (×12): qty 50

## 2019-04-03 MED ORDER — DOXYCYCLINE HYCLATE 100 MG PO TABS
100.0000 mg | ORAL_TABLET | Freq: Two times a day (BID) | ORAL | Status: DC
  Administered 2019-04-03 – 2019-04-08 (×11): 100 mg via ORAL
  Filled 2019-04-03 (×10): qty 1

## 2019-04-03 MED ORDER — LOPERAMIDE HCL 2 MG PO CAPS: 2.0000 mg | ORAL_CAPSULE | ORAL | Status: DC | PRN

## 2019-04-03 MED ORDER — HYDRALAZINE HCL 10 MG PO TABS
10.0000 mg | ORAL_TABLET | Freq: Three times a day (TID) | ORAL | Status: DC
  Administered 2019-04-03 – 2019-04-05 (×6): 10 mg via ORAL
  Filled 2019-04-03 (×6): qty 1

## 2019-04-03 NOTE — Progress Notes (Signed)
  Echocardiogram 2D Echocardiogram has been performed.  Jennette Dubin 04/03/2019, 10:47 AM

## 2019-04-03 NOTE — TOC Progression Note (Signed)
Transition of Care [facility]) - Progression Note    Patient Details  Name: [name] MRN: [id number] Date of Birth: Aug 18, 1940  [facility] CM/SW Contact  Zenon Mayo, RN Phone Number: 04/03/2019, 9:55 AM  Clinical Narrative:    NCM spoke with patient, offered choice for [facility], she states she has to talk to her son when he comes so to check back with her.         Expected Discharge Plan and Services           Expected Discharge Date: 04/04/19                                     Social Determinants of Health (SDOH) Interventions    Readmission Risk Interventions No flowsheet data found.

## 2019-04-03 NOTE — Progress Notes (Signed)
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 The patient was admitted to a telemetry bed. She underwent an echocardiogram which demonstrated an EF of 60-65% with evidence of impaired relaxation. There is moderate mitral annular calcification present. There is mild to moderate stenosis of the aortic valve. No wall motion abnormalities. She has received IV fluids, but is now negative for orthostasis as of 04/03/2019. Urine culture has grown out proteus mirabilis. She will be continued on doxycycline for her knee wound. She is also receiving IV Ancef for her UTI.  The patient has been evaluated by physical therapy. They have recommended that she discharge to SNF. Family is in agreeement. Awaiting placement.  Consultants  . None  Procedures  . None  Antibiotics   Anti-infectives (From admission, onward)   Start     Dose/Rate Route Frequency Ordered Stop   04/03/19 0830  ceFAZolin (ANCEF) IVPB 1 g/50 mL premix     1 g 100 mL/hr over 30 Minutes Intravenous Every 8 hours 04/03/19 0815     04/03/19 0830  doxycycline (VIBRA-TABS) tablet 100 mg     100 mg Oral 2 times daily 04/03/19 0817     04/01/19 1900  rifampin (RIFADIN) capsule 300 mg     300 mg Oral 2 times daily 04/01/19 1345     04/01/19 1800  vancomycin (VANCOCIN) 50 mg/mL oral solution 125 mg  Status:  Discontinued     125 mg Oral 4  times daily 04/01/19 1758 04/03/19 1258   04/01/19 1400  doxycycline (VIBRA-TABS) tablet 100 mg  Status:  Discontinued     100 mg Oral 2 times daily 04/01/19 1345 04/03/19 0815    .  [name]   Subjective  The patient is resting comfortably. No new complaining.  Objective   Vitals:  Vitals:   04/03/19 1153 04/03/19 1238  BP: (!) 180/71 (!) 184/84  Pulse:  84  Resp:  18  Temp:  98.2 F (36.8 C)  SpO2:  93%    Exam:  Constitutional:  . The patient is awake, alert, and oriented x 3. No acute distress. Respiratory:  . No increased work of breathing.  . No wheezes, rales, or rhonchi. . No tactile fremitus. Cardiovascular:  . Regular, rate, and rhythm . No murmurs, ectopy, or gallups. . No lateral PMI. No thrills. Abdomen:  . Abdomen is soft, non-tender, non-distended. . No hernias, masses, or organomegaly . Normoactive bowel sounds. Musculoskeletal:  . No cyanosis, clubbing, or edema Skin:  . No rashes, lesions, ulcers . palpation of skin: no induration or nodules Neurologic:  . CN 2-12 intact . Sensation all 4 extremities intact Psychiatric:  . Mental status o Mood, affect appropriate o Orientation to person, place, time  . judgment and insight appear intact    I have personally reviewed the following:   Today's Data  . CBC, BMP, Vitals  Micro Data  . Urine cultures  Scheduled  Meds: . amLODipine  10 mg Oral Daily  . aspirin EC  81 mg Oral Daily  . atorvastatin  40 mg Oral q1800  . carvedilol  12.5 mg Oral BID WC  . doxycycline  100 mg Oral BID  . hydrALAZINE  10 mg Oral Q8H  . magnesium oxide  200 mg Oral BH-q7a  . mesalamine  2.4 g Oral Q breakfast  . multivitamin with minerals  1 tablet Oral Daily  . mupirocin cream   Topical Daily  . rifampin  300 mg Oral BID  . sodium chloride flush  3 mL Intravenous Q12H  . traMADol  50 mg Oral Daily   Continuous Infusions: .  ceFAZolin (ANCEF) IV 1 g (04/03/19 1032)    Principal Problem:   Syncope Active  Problems:   Hyperlipidemia   Essential hypertension   Chronic infection of prosthetic knee (HCC)   Chronic diarrhea   LOS: 1 day   A & P   Syncope: Without significant prodrome, continue to monitor on telemetry, EKG appears unremarkable, orthostatic vitals are negative, although they were taken after the patient received IV fluid. Suspect hypovolemia could have been contributing due to worsening diarrhea and AKI on admission. Hypovolemia now resolved. Echocardiogram is unremarkable. Likely related to the patient's proteus mirabilis UTI.   Echocardiogram was obtained that demonstrated: EF of 60-65%.evidense of impaired relaxation. No wall motion abnormalities. The patient has been evaluated by Physical therapy. They have recommend discharge to SNF. We are awaiting placement.  AKI: Patient with baseline creatinine 0.87 in April, now 1.8. Likely prerenal from worsening diarrhea, improving with hydration, hold ACE inhibitor. Creatinine improved 1.04.   Acute on chronic diarrhea: Has frequent diarrhea at baseline possibly malabsorption or from long term antibiotic use, worsened in the last 4 to 5 days.C Diff not tested as the patient has had a normal bowel movement. C Diff cancelled. Takes mesalamine chronically this was continued.   Abnormal urinalysis: Urine culture has grown out Proteus mirabilis. She has been continued on IV Ancef while awaiting sensitivities.  History of knee infection: Has a prosthetic knee infection with MRSA, on chronic suppressive antibiotic therapy She continues to see Dr. Megan Salon, continue both doxycycline and rifampin, Dr. Inda Merlin discussed with Dr. Megan Salon yesterday who will arrange follow-up in a week. Wound care consult.  HTN: Continue Norvasc, Coreg. Hold Lisinopril given AKI.  HLD: Continue Lipitor  I have seen and examined this patient myself. I have spent 32 minutes in her evaluation and care.  DVT prophylaxis:SCDs, add lovenox when plts better  Code Status:DNR Family Communication: Called and updated son [name] Disposition Plan:Home once clinically improved   Johnny Gorter, DO [facility] Direct contact: see [email or web address]  7PM-7AM contact night coverage as above 04/03/2019, 1:59 PM  LOS: 1 day

## 2019-04-03 NOTE — Plan of Care (Signed)
  Problem: Clinical Measurements: Goal: Will remain free from infection Outcome: Progressing   Problem: Safety: Goal: Ability to remain free from injury will improve Outcome: Progressing   

## 2019-04-03 NOTE — Plan of Care (Signed)
  Problem: Clinical Measurements: Goal: Will remain free from infection Outcome: Progressing   Problem: Activity: Goal: Risk for activity intolerance will decrease Outcome: Progressing   Problem: Safety: Goal: Ability to remain free from injury will improve Outcome: Progressing   

## 2019-04-03 NOTE — Progress Notes (Signed)
Physical Therapy Treatment Patient Details Name: [name] MRN: [id number] DOB: 07-01-1941 Today's Date: 04/03/2019    History of Present Illness Patient is a 78 year old female who suffered fall at home. She does not recall what happened just found herself on the floor. PMH to include: chronic left knee infection/pain after TKA, B THA, CAD, HTN, OA, PVD.    PT Comments    Pt supine on arrival with elevated BP today with initial attempt pt BP  199/77 with RN having given meds and returned an hour later to attempt mobility. BP supine 180/71 with rise to 189/93 with limited mobility in room and deferred any further attempts of gait due to HTN with pt and RN aware. Pt reports decreased strength and function with increased pain today. [name] (son) present throughout session with discussion regarding pt need for assist with all mobility currently and family only able to provide intermittent assist. Pt and son agreeable to change in plan to SNF until pt able to return to mod I level for return home. Will continue to follow acutely and recommend daily mobility with nursing assist.    Follow Up Recommendations  SNF;Supervision for mobility/OOB     Equipment Recommendations  None recommended by PT    Recommendations for Other Services       Precautions / Restrictions Precautions Precautions: Fall Precaution Comments: check BP    Mobility  Bed Mobility Overal bed mobility: Needs Assistance Bed Mobility: Supine to Sit     Supine to sit: Min assist     General bed mobility comments: assist to clear LLE off EOB and elevate trunk with HOB 20 degrees with use of rail  Transfers Overall transfer level: Needs assistance   Transfers: Sit to/from Stand;Stand Pivot Transfers Sit to Stand: Min guard Stand pivot transfers: Min assist       General transfer comment: guarding with cues for hand placement to rise from bed and BSC. Min assist with cues for hand placement and sequence to  pivot 90 bed to [facility]  Ambulation/Gait Ambulation/Gait assistance: Min assist Gait Distance (Feet): 15 Feet Assistive device: Rolling walker (2 wheeled) Gait Pattern/deviations: Step-to pattern;Trunk flexed;Antalgic   Gait velocity interpretation: <1.8 ft/sec, indicate of risk for recurrent falls General Gait Details: decreased stance on LLE with Left knee flexion contracture grossly 30 degrees. Cues for safety and RW management with pt running into objects x 2. Physical assist to back to surface as pt with great difficulty moving RLE backward with RW with increased assist to control descent to recliner   Stairs             Wheelchair Mobility    Modified Rankin (Stroke Patients Only)       Balance Overall balance assessment: Needs assistance Sitting-balance support: Feet supported;Single extremity supported Sitting balance-Leahy Scale: Fair     Standing balance support: Bilateral upper extremity supported Standing balance-Leahy Scale: Poor                              Cognition Arousal/Alertness: Awake/alert Behavior During Therapy: WFL for tasks assessed/performed Overall Cognitive Status: Within Functional Limits for tasks assessed                                        Exercises General Exercises - Lower Extremity Long Arc Quad: AROM;Both;Seated;10 reps Hip Flexion/Marching: AROM;Both;Seated;10  reps    General Comments        Pertinent Vitals/Pain Pain Score: 5  Pain Location: left knee Pain Descriptors / Indicators: Discomfort;Aching;Guarding Pain Intervention(s): Limited activity within patient's tolerance;Premedicated before session;Repositioned    Home Living                      Prior Function            PT Goals (current goals can now be found in the care plan section) Progress towards PT goals: Progressing toward goals    Frequency    Min 3X/week      PT Plan Discharge plan needs to be updated     Co-evaluation              AM-PAC PT "6 Clicks" Mobility   Outcome Measure  Help needed turning from your back to your side while in a flat bed without using bedrails?: A Little Help needed moving from lying on your back to sitting on the side of a flat bed without using bedrails?: A Little Help needed moving to and from a bed to a chair (including a wheelchair)?: A Little Help needed standing up from a chair using your arms (e.g., wheelchair or bedside chair)?: A Little Help needed to walk in hospital room?: A Little Help needed climbing 3-5 steps with a railing? : A Lot 6 Click Score: 17    End of Session Equipment Utilized During Treatment: Gait belt Activity Tolerance: Patient tolerated treatment well;Other (comment)(limited by HTN and fatigue) Patient left: in chair;with call bell/phone within reach;with chair alarm set;with family/visitor present Nurse Communication: Mobility status(BP) PT Visit Diagnosis: Unsteadiness on feet (R26.81);Muscle weakness (generalized) (M62.81);Difficulty in walking, not elsewhere classified (R26.2);Other abnormalities of gait and mobility (R26.89);Pain;History of falling (Z91.81)     Time: 8546-2703 PT Time Calculation (min) (ACUTE ONLY): 31 min  Charges:  $Gait Training: 8-22 mins $Therapeutic Activity: 8-22 mins                     Hildred Pharo Pam Drown, PT Acute Rehabilitation Services Pager: [phone] Office: [place] 04/03/2019, 12:47 PM

## 2019-04-03 NOTE — Progress Notes (Signed)
Pt. States she has not had BM since 9/1 (48 hours). Pt. On enteric precautions for reported multiple stools at home. Stool collected this shift and sent to lab per order. Specimen rejected by lab due to stool being formed.

## 2019-04-04 LAB — BASIC METABOLIC PANEL
Anion gap: 13 (ref 5–15)
BUN: 14 mg/dL (ref 8–23)
CO2: 21 mmol/L — ABNORMAL LOW (ref 22–32)
Calcium: 8.7 mg/dL — ABNORMAL LOW (ref 8.9–10.3)
Chloride: 103 mmol/L (ref 98–111)
Creatinine, Ser: 0.97 mg/dL (ref 0.44–1.00)
GFR calc Af Amer: >60 mL/min (ref >60)
GFR calc non Af Amer: 56 mL/min — ABNORMAL LOW (ref >60)
Glucose, Bld: 82 mg/dL (ref 70–99)
Potassium: 3.1 mmol/L — ABNORMAL LOW (ref 3.5–5.1)
Sodium: 137 mmol/L (ref 135–145)

## 2019-04-04 LAB — CBC WITH DIFFERENTIAL/PLATELET
Abs Immature Granulocytes: 0.03 10*3/uL (ref 0.00–0.07)
Basophils Absolute: 0.1 10*3/uL (ref 0.0–0.1)
Basophils Relative: 1 %
Eosinophils Absolute: 0.2 10*3/uL (ref 0.0–0.5)
Eosinophils Relative: 2 %
HCT: 34.6 % — ABNORMAL LOW (ref 36.0–46.0)
Hemoglobin: 11.9 g/dL — ABNORMAL LOW (ref 12.0–15.0)
Immature Granulocytes: 0 %
Lymphocytes Relative: 13 %
Lymphs Abs: 1.2 10*3/uL (ref 0.7–4.0)
MCH: 33.6 pg (ref 26.0–34.0)
MCHC: 34.4 g/dL (ref 30.0–36.0)
MCV: 97.7 fL (ref 80.0–100.0)
Monocytes Absolute: 1 10*3/uL (ref 0.1–1.0)
Monocytes Relative: 11 %
Neutro Abs: 6.5 10*3/uL (ref 1.7–7.7)
Neutrophils Relative %: 73 %
Platelets: 116 10*3/uL — ABNORMAL LOW (ref 150–400)
RBC: 3.54 MIL/uL — ABNORMAL LOW (ref 3.87–5.11)
RDW: 13.1 % (ref 11.5–15.5)
WBC: 9 10*3/uL (ref 4.0–10.5)
nRBC: 0 % (ref 0.0–0.2)

## 2019-04-04 LAB — URINE CULTURE: Culture: >=100000 — AB

## 2019-04-04 MED ORDER — POTASSIUM CHLORIDE CRYS ER 20 MEQ PO TBCR
40.0000 meq | EXTENDED_RELEASE_TABLET | Freq: Once | ORAL | Status: AC
  Administered 2019-04-04: 40 meq via ORAL
  Filled 2019-04-04: qty 2

## 2019-04-04 MED ORDER — METOPROLOL TARTRATE 5 MG/5ML IV SOLN
5.0000 mg | INTRAVENOUS | Status: DC | PRN
  Administered 2019-04-05: 5 mg via INTRAVENOUS
  Filled 2019-04-04: qty 5

## 2019-04-04 MED ORDER — POTASSIUM CHLORIDE 10 MEQ/100ML IV SOLN
10.0000 meq | INTRAVENOUS | Status: DC
  Filled 2019-04-04: qty 100

## 2019-04-04 NOTE — Progress Notes (Signed)
Pt.  BP 200/87. On call for [facility] paged to make aware.

## 2019-04-04 NOTE — Progress Notes (Signed)
PROGRESS NOTE  CASSIDI MODESITT FBP:[id number] DOB: 1940-10-02 DOA: 04/01/2019 PCP: Darreld Mclean, MD  Brief History   Courtney Spiewak Robertsis a 78 y.o.femalewith medical history significant ofCADs/p stents; HTN; HLD; and chronic L knee MRSA infection on suppressive antibiotics presenting with a fall, syncopal event after which she suffered a black eye, she denies any preceding symptoms prior to being found down Reported feeling weak for the past few days and was having more diarrhea than baseline, does have some chronic diarrhea usually In the ED was noted to have acute kidney injury of creatinine of 1.79 from 0.8, leukocytosis and admitted to [facility]. The patient was admitted to a telemetry bed. She underwent an echocardiogram which demonstrated an EF of 60-65% with evidence of impaired relaxation. There is moderate mitral annular calcification present. There is mild to moderate stenosis of the aortic valve. No wall motion abnormalities. She has received IV fluids, but is now negative for orthostasis as of 04/03/2019. Urine culture has grown out proteus mirabilis. She will be continued on doxycycline for her knee wound. She is also receiving IV Ancef for her UTI.  The patient has been evaluated by physical therapy. They have recommended that she discharge to SNF. Family is in agreeement. Awaiting placement.  Consultants  . None  Procedures  . None  Antibiotics   Anti-infectives (From admission, onward)   Start     Dose/Rate Route Frequency Ordered Stop   04/03/19 0830  ceFAZolin (ANCEF) IVPB 1 g/50 mL premix     1 g 100 mL/hr over 30 Minutes Intravenous Every 8 hours 04/03/19 0815     04/03/19 0830  doxycycline (VIBRA-TABS) tablet 100 mg     100 mg Oral 2 times daily 04/03/19 0817     04/01/19 1900  rifampin (RIFADIN) capsule 300 mg     300 mg Oral 2 times daily 04/01/19 1345     04/01/19 1800  vancomycin (VANCOCIN) 50 mg/mL oral solution 125 mg  Status:  Discontinued     125 mg Oral 4  times daily 04/01/19 1758 04/03/19 1258   04/01/19 1400  doxycycline (VIBRA-TABS) tablet 100 mg  Status:  Discontinued     100 mg Oral 2 times daily 04/01/19 1345 04/03/19 0815     .   Subjective  The patient is resting comfortably. No new complaints.  Objective   Vitals:  Vitals:   04/04/19 0911 04/04/19 1146  BP: (!) 159/68 (!) 174/77  Pulse: 77 73  Resp: 20 18  Temp: 98.1 F (36.7 C) 98.6 F (37 C)  SpO2: 91% 94%    Exam:  Constitutional:  . The patient is awake, alert, and oriented x 3. No acute distress. Respiratory:  . No increased work of breathing.  . No wheezes, rales, or rhonchi. . No tactile fremitus. Cardiovascular:  . Regular, rate, and rhythm . No murmurs, ectopy, or gallups. . No lateral PMI. No thrills. Abdomen:  . Abdomen is soft, non-tender, non-distended. . No hernias, masses, or organomegaly . Normoactive bowel sounds. Musculoskeletal:  . No cyanosis, clubbing, or edema Skin:  . No rashes, lesions, ulcers . palpation of skin: no induration or nodules Neurologic:  . CN 2-12 intact . Sensation all 4 extremities intact Psychiatric:  . Mental status o Mood, affect appropriate o Orientation to person, place, time  . judgment and insight appear intact    I have personally reviewed the following:   Today's Data  . CBC, BMP, Vitals  Micro Data  . Urine cultures  Scheduled Meds: . amLODipine  10 mg Oral Daily  . aspirin EC  81 mg Oral Daily  . atorvastatin  40 mg Oral q1800  . carvedilol  12.5 mg Oral BID WC  . doxycycline  100 mg Oral BID  . hydrALAZINE  10 mg Oral Q8H  . magnesium oxide  200 mg Oral BH-q7a  . mesalamine  2.4 g Oral Q breakfast  . multivitamin with minerals  1 tablet Oral Daily  . mupirocin cream   Topical Daily  . rifampin  300 mg Oral BID  . sodium chloride flush  3 mL Intravenous Q12H  . traMADol  50 mg Oral Daily   Continuous Infusions: .  ceFAZolin (ANCEF) IV 1 g (04/04/19 1348)    Principal Problem:    Syncope Active Problems:   Hyperlipidemia   Essential hypertension   Chronic infection of prosthetic knee (HCC)   Chronic diarrhea   LOS: 2 days   A & P   Syncope: Without significant prodrome, continue to monitor on telemetry, EKG appears unremarkable, orthostatic vitals are negative, although they were taken after the patient received IV fluid. Suspect hypovolemia could have been contributing due to worsening diarrhea and AKI on admission. Hypovolemia now resolved. Echocardiogram is unremarkable. Likely related to the patient's proteus mirabilis UTI. She is receiving Ancef for that.  Echocardiogram was obtained that demonstrated: EF of 60-65%.evidense of impaired relaxation. No wall motion abnormalities. The patient has been evaluated by Physical therapy. They have recommend discharge to SNF. We are awaiting placement.  AKI: Patient with baseline creatinine 0.87 in April, now 1.8. Likely prerenal from worsening diarrhea, improving with hydration, hold ACE inhibitor. Creatinine improved 0.97.[name]   Acute on chronic diarrhea: Has frequent diarrhea at baseline possibly malabsorption or from long term antibiotic use, worsened in the last 4 to 5 days.C Diff not tested as the patient has had a normal bowel movement. C Diff cancelled. Takes mesalamine chronically this was continued.   Abnormal urinalysis: Urine culture has grown out Proteus mirabilis. She has been continued on IV Ancef while awaiting sensitivities.  History of knee infection: Has a prosthetic knee infection with MRSA, on chronic suppressive antibiotic therapy She continues to see Dr. Megan Salon, continue both doxycycline and rifampin, Dr. Inda Merlin discussed with Dr. Megan Salon yesterday who will arrange follow-up in a week. Wound care consult.  HTN: Continue Norvasc, Coreg. Hold Lisinopril given AKI.  HLD: Continue Lipitor  I have seen and examined this patient myself. I have spent 30 minutes in her evaluation and care.  DVT  prophylaxis:SCDs, add lovenox when plts better Code Status:DNR Family Communication: Called and updated son [name] Disposition Plan:Home once clinically improved   Victoriano Campion, DO [facility] Direct contact: see [email or web address]  7PM-7AM contact night coverage as above 04/04/2019, 2:19 PM  LOS: 1 day

## 2019-04-05 LAB — BASIC METABOLIC PANEL
Anion gap: 9 (ref 5–15)
BUN: 14 mg/dL (ref 8–23)
CO2: 23 mmol/L (ref 22–32)
Calcium: 8.8 mg/dL — ABNORMAL LOW (ref 8.9–10.3)
Chloride: 104 mmol/L (ref 98–111)
Creatinine, Ser: 0.94 mg/dL (ref 0.44–1.00)
GFR calc Af Amer: >60 mL/min (ref >60)
GFR calc non Af Amer: 59 mL/min — ABNORMAL LOW (ref >60)
Glucose, Bld: 105 mg/dL — ABNORMAL HIGH (ref 70–99)
Potassium: 4.1 mmol/L (ref 3.5–5.1)
Sodium: 136 mmol/L (ref 135–145)

## 2019-04-05 MED ORDER — HYDRALAZINE HCL 25 MG PO TABS
25.0000 mg | ORAL_TABLET | Freq: Three times a day (TID) | ORAL | Status: DC
  Administered 2019-04-05 – 2019-04-08 (×9): 25 mg via ORAL
  Filled 2019-04-05 (×10): qty 1

## 2019-04-05 MED ORDER — CARVEDILOL 12.5 MG PO TABS
12.5000 mg | ORAL_TABLET | Freq: Once | ORAL | Status: AC
  Administered 2019-04-05: 12.5 mg via ORAL
  Filled 2019-04-05: qty 1

## 2019-04-05 MED ORDER — CARVEDILOL 25 MG PO TABS
25.0000 mg | ORAL_TABLET | Freq: Two times a day (BID) | ORAL | Status: DC
  Administered 2019-04-05 – 2019-04-08 (×6): 25 mg via ORAL
  Filled 2019-04-05 (×6): qty 1

## 2019-04-05 MED ORDER — LOSARTAN POTASSIUM 25 MG PO TABS
25.0000 mg | ORAL_TABLET | Freq: Every day | ORAL | Status: DC
  Administered 2019-04-05 – 2019-04-08 (×4): 25 mg via ORAL
  Filled 2019-04-05 (×4): qty 1

## 2019-04-05 NOTE — Plan of Care (Signed)
  Problem: Clinical Measurements: Goal: Will remain free from infection Outcome: Progressing   Problem: Safety: Goal: Ability to remain free from injury will improve Outcome: Progressing   

## 2019-04-05 NOTE — Progress Notes (Signed)
PROGRESS NOTE  TEJAL MONROY CLE:[id number] DOB: 1940/12/06 DOA: 04/01/2019 PCP: Darreld Mclean, MD  Brief History   [name] Robertsis a 78 y.o.femalewith medical history significant ofCADs/p stents; HTN; HLD; and chronic L knee MRSA infection on suppressive antibiotics presenting with a fall, syncopal event after which she suffered a black eye, she denies any preceding symptoms prior to being found down Reported feeling weak for the past few days and was having more diarrhea than baseline, does have some chronic diarrhea usually In the ED was noted to have acute kidney injury of creatinine of 1.79 from 0.8, leukocytosis and admitted to [facility]. The patient was admitted to a telemetry bed. She underwent an echocardiogram which demonstrated an EF of 60-65% with evidence of impaired relaxation. There is moderate mitral annular calcification present. There is mild to moderate stenosis of the aortic valve. No wall motion abnormalities. She has received IV fluids, but is now negative for orthostasis as of 04/03/2019. Urine culture has grown out proteus mirabilis. She will be continued on doxycycline for her knee wound. She is also receiving IV Ancef for her UTI.  The patient has been evaluated by physical therapy. They have recommended that she discharge to SNF. Family is in agreeement. Awaiting placement.  Consultants  . None  Procedures  . None  Antibiotics   Anti-infectives (From admission, onward)   Start     Dose/Rate Route Frequency Ordered Stop   04/03/19 0830  ceFAZolin (ANCEF) IVPB 1 g/50 mL premix     1 g 100 mL/hr over 30 Minutes Intravenous Every 8 hours 04/03/19 0815     04/03/19 0830  doxycycline (VIBRA-TABS) tablet 100 mg     100 mg Oral 2 times daily 04/03/19 0817     04/01/19 1900  rifampin (RIFADIN) capsule 300 mg     300 mg Oral 2 times daily 04/01/19 1345     04/01/19 1800  vancomycin (VANCOCIN) 50 mg/mL oral solution 125 mg  Status:  Discontinued     125 mg Oral 4  times daily 04/01/19 1758 04/03/19 1258   04/01/19 1400  doxycycline (VIBRA-TABS) tablet 100 mg  Status:  Discontinued     100 mg Oral 2 times daily 04/01/19 1345 04/03/19 0815     .   Subjective  The patient is resting comfortably. No new complaints.  Objective   Vitals:  Vitals:   04/05/19 1018 04/05/19 1217  BP: (!) 190/87 (!) 158/72  Pulse: 68 72  Resp:  20  Temp:  97.6 F (36.4 C)  SpO2:  92%    Exam:  Constitutional:  . The patient is awake, alert, and oriented x 3. No acute distress. Respiratory:  . No increased work of breathing.  . No wheezes, rales, or rhonchi. . No tactile fremitus. Cardiovascular:  . Regular, rate, and rhythm . No murmurs, ectopy, or gallups. . No lateral PMI. No thrills. Abdomen:  . Abdomen is soft, non-tender, non-distended. . No hernias, masses, or organomegaly . Normoactive bowel sounds. Musculoskeletal:  . No cyanosis, clubbing, or edema Skin:  . No rashes, lesions, ulcers . palpation of skin: no induration or nodules Neurologic:  . CN 2-12 intact . Sensation all 4 extremities intact Psychiatric:  . Mental status o Mood, affect appropriate o Orientation to person, place, time  . judgment and insight appear intact    I have personally reviewed the following:   Today's Data  . BMP, Vitals  Micro Data  . Urine cultures  Scheduled Meds: .  amLODipine  10 mg Oral Daily  . aspirin EC  81 mg Oral Daily  . atorvastatin  40 mg Oral q1800  . carvedilol  25 mg Oral BID WC  . doxycycline  100 mg Oral BID  . hydrALAZINE  25 mg Oral Q8H  . losartan  25 mg Oral Daily  . magnesium oxide  200 mg Oral BH-q7a  . mesalamine  2.4 g Oral Q breakfast  . multivitamin with minerals  1 tablet Oral Daily  . mupirocin cream   Topical Daily  . rifampin  300 mg Oral BID  . sodium chloride flush  3 mL Intravenous Q12H  . traMADol  50 mg Oral Daily   Continuous Infusions: .  ceFAZolin (ANCEF) IV 1 g (04/05/19 7902)    Principal  Problem:   Syncope Active Problems:   Hyperlipidemia   Essential hypertension   Chronic infection of prosthetic knee (HCC)   Chronic diarrhea   LOS: 3 days   A & P   Syncope: Without significant prodrome, continue to monitor on telemetry, EKG appears unremarkable, orthostatic vitals are negative, although they were taken after the patient received IV fluid. Suspect hypovolemia could have been contributing due to worsening diarrhea and AKI on admission. Hypovolemia now resolved. Echocardiogram is unremarkable. Likely related to the patient's proteus mirabilis/e.coli UTI. She is receiving Ancef for that.  Hypertension: Uncontrolled. I have increased the patient's coreg and added losartan. Monitor.  Echocardiogram was obtained that demonstrated: EF of 60-65%.evidense of impaired relaxation. No wall motion abnormalities. The patient has been evaluated by Physical therapy. They have recommend discharge to SNF. We are awaiting placement.  AKI: Patient with baseline creatinine 0.87 in April, now 1.8. Likely prerenal from worsening diarrhea, improving with hydration, hold ACE inhibitor. Creatinine improved 0.94.  Acute on chronic diarrhea: Has frequent diarrhea at baseline possibly malabsorption or from long term antibiotic use, worsened in the last 4 to 5 days.C Diff not tested as the patient has had a normal bowel movement. C Diff cancelled. Takes mesalamine chronically this was continued.   Abnormal urinalysis: Urine culture has grown out Proteus mirabilis and E.Coli. She has been continued on IV Ancef while awaiting sensitivities.  History of knee infection: Has a prosthetic knee infection with MRSA, on chronic suppressive antibiotic therapy She continues to see Dr. Megan Salon, continue both doxycycline and rifampin, Dr. Inda Merlin discussed with Dr. Megan Salon yesterday who will arrange follow-up in a week. Wound care consult.  HTN: Continue Norvasc, Coreg. Hold Lisinopril given AKI.  HLD:  Continue Lipitor  I have seen and examined this patient myself. I have spent 32 minutes in her evaluation and care.  DVT prophylaxis:SCDs, add lovenox when plts better Code Status:DNR Family Communication: Called and updated son [name] Disposition Plan:Home once clinically improved   Palmira Stickle, DO [facility] Direct contact: see [email or web address]  7PM-7AM contact night coverage as above 04/05/2019, 2:00 PM  LOS: 1 day

## 2019-04-06 MED ORDER — CEPHALEXIN 250 MG PO CAPS
500.0000 mg | ORAL_CAPSULE | Freq: Two times a day (BID) | ORAL | Status: DC
  Administered 2019-04-06 – 2019-04-07 (×4): 500 mg via ORAL
  Filled 2019-04-06 (×4): qty 2

## 2019-04-06 NOTE — Plan of Care (Signed)
  Problem: Activity: Goal: Risk for activity intolerance will decrease Outcome: Progressing   Problem: Elimination: Goal: Will not experience complications related to bowel motility Outcome: Progressing   Problem: Safety: Goal: Ability to remain free from injury will improve Outcome: Progressing   

## 2019-04-06 NOTE — Progress Notes (Signed)
PROGRESS NOTE  [name]:[id number] DOB: [birth year] DOA: 04/01/2019 PCP: Darreld Mclean, MD  Brief History   Courtney Tomey Robertsis a 78 y.o.femalewith medical history significant ofCADs/p stents; HTN; HLD; and chronic L knee MRSA infection on suppressive antibiotics presenting with a fall, syncopal event after which she suffered a black eye, she denies any preceding symptoms prior to being found down Reported feeling weak for the past few days and was having more diarrhea than baseline, does have some chronic diarrhea usually.  In the ED was noted to have acute kidney injury of creatinine of 1.79 from 0.8, leukocytosis and admitted to [facility].  The patient was admitted to a telemetry bed. She underwent an echocardiogram which demonstrated an EF of 60-65% with evidence of impaired relaxation. There is moderate mitral annular calcification present. There is mild to moderate stenosis of the aortic valve. No wall motion abnormalities. She has received IV fluids, but is now negative for orthostasis as of 04/03/2019. Urine culture has grown out proteus mirabilis. She will be continued on doxycycline for her knee wound. She is also receiving IV Ancef for her UTI.  The patient has been evaluated by physical therapy. They have recommended that she discharge to SNF. Family is in agreeement. Awaiting placement.  Consultants  . None  Procedures  . None  Antibiotics   Anti-infectives (From admission, onward)   Start     Dose/Rate Route Frequency Ordered Stop   04/03/19 0830  ceFAZolin (ANCEF) IVPB 1 g/50 mL premix     1 g 100 mL/hr over 30 Minutes Intravenous Every 8 hours 04/03/19 0815     04/03/19 0830  doxycycline (VIBRA-TABS) tablet 100 mg     100 mg Oral 2 times daily 04/03/19 0817     04/01/19 1900  rifampin (RIFADIN) capsule 300 mg     300 mg Oral 2 times daily 04/01/19 1345     04/01/19 1800  vancomycin (VANCOCIN) 50 mg/mL oral solution 125 mg  Status:  Discontinued     125 mg  Oral 4 times daily 04/01/19 1758 04/03/19 1258   04/01/19 1400  doxycycline (VIBRA-TABS) tablet 100 mg  Status:  Discontinued     100 mg Oral 2 times daily 04/01/19 1345 04/03/19 0815     .   Subjective  The patient is resting comfortably. No new complaints.  Objective   Vitals:  Vitals:   04/06/19 0843 04/06/19 1122  BP: 121/83 111/60  Pulse: 82 76  Resp:  16  Temp:  97.9 F (36.6 C)  SpO2:  95%    Exam:  Constitutional:  . The patient is awake, alert, and oriented x 3. No acute distress. Respiratory:  . No increased work of breathing.  . No wheezes, rales, or rhonchi. . No tactile fremitus. Cardiovascular:  . Regular, rate, and rhythm . No murmurs, ectopy, or gallups. . No lateral PMI. No thrills. Abdomen:  . Abdomen is soft, non-tender, non-distended. . No hernias, masses, or organomegaly . Normoactive bowel sounds. Musculoskeletal:  . No cyanosis, clubbing, or edema Skin:  . No rashes, lesions, ulcers . palpation of skin: no induration or nodules Neurologic:  . CN 2-12 intact . Sensation all 4 extremities intact Psychiatric:  . Mental status o Mood, affect appropriate o Orientation to person, place, time  . judgment and insight appear intact    I have personally reviewed the following:   Today's Data  . Orthoptist  . Urine cultures  Scheduled Meds: . amLODipine  10 mg Oral Daily  . aspirin EC  81 mg Oral Daily  . atorvastatin  40 mg Oral q1800  . carvedilol  25 mg Oral BID WC  . doxycycline  100 mg Oral BID  . hydrALAZINE  25 mg Oral Q8H  . losartan  25 mg Oral Daily  . magnesium oxide  200 mg Oral BH-q7a  . mesalamine  2.4 g Oral Q breakfast  . multivitamin with minerals  1 tablet Oral Daily  . mupirocin cream   Topical Daily  . rifampin  300 mg Oral BID  . sodium chloride flush  3 mL Intravenous Q12H  . traMADol  50 mg Oral Daily   Continuous Infusions: .  ceFAZolin (ANCEF) IV 1 g (04/06/19 0557)    Principal Problem:    Syncope Active Problems:   Hyperlipidemia   Essential hypertension   Chronic infection of prosthetic knee (HCC)   Chronic diarrhea   LOS: 4 days   A & P   Syncope: Without significant prodrome, continue to monitor on telemetry, EKG appears unremarkable, orthostatic vitals are negative, although they were taken after the patient received IV fluid. Suspect hypovolemia could have been contributing due to worsening diarrhea and AKI on admission. Hypovolemia now resolved. Echocardiogram is unremarkable. Likely related to the patient's proteus mirabilis/e.coli UTI. She is receiving Ancef for that. Will convert to cephalexin.   Hypertension: Uncontrolled. I have increased the patient's coreg and added losartan. Monitor.  Echocardiogram was obtained that demonstrated: EF of 60-65%.evidense of impaired relaxation. No wall motion abnormalities. The patient has been evaluated by Physical therapy. They have recommend discharge to SNF. We are awaiting placement.  AKI: Patient with baseline creatinine 0.87 in April, now 1.8. Likely prerenal from worsening diarrhea, improving with hydration, hold ACE inhibitor. Creatinine improved 0.94.  Acute on chronic diarrhea: Has frequent diarrhea at baseline possibly malabsorption or from long term antibiotic use, worsened in the last 4 to 5 days.C Diff not tested as the patient has had a normal bowel movement. C Diff cancelled. Takes mesalamine chronically this was continued.   Abnormal urinalysis: Urine culture has grown out Proteus mirabilis and E.Coli. She has been continued on IV Ancef while awaiting sensitivities.  History of knee infection: Has a prosthetic knee infection with MRSA, on chronic suppressive antibiotic therapy She continues to see Dr. Megan Salon, continue both doxycycline and rifampin, Dr. Inda Merlin discussed with Dr. Megan Salon yesterday who will arrange follow-up in a week. Wound care consult.  HTN: Continue Norvasc, Coreg. Hold Lisinopril given  AKI.  HLD: Continue Lipitor  I have seen and examined this patient myself. I have spent 30 minutes in her evaluation and care.  DVT prophylaxis:SCDs, add lovenox when plts better Code Status:DNR Family Communication: Called and updated son [name] Disposition Plan:Home once clinically improved   Adison Jerger, DO [facility] Direct contact: see [email or web address]  7PM-7AM contact night coverage as above 04/06/2019, 1:48 PM  LOS: 1 day

## 2019-04-06 NOTE — Progress Notes (Signed)
Pt tolerated wound cleaning and dressing change well.

## 2019-04-06 NOTE — Care Management Important Message (Signed)
Important Message  Patient Details  Name: [name] MRN: [id number] Date of Birth: [birth year]   Medicare Important Message Given:  Yes     Orbie Pyo 04/06/2019, 2:52 PM

## 2019-04-07 LAB — SARS CORONAVIRUS 2 (TAT 6-24 HRS): SARS Coronavirus 2: NEGATIVE

## 2019-04-07 MED ORDER — POTASSIUM CHLORIDE CRYS ER 20 MEQ PO TBCR: 20.0000 meq | EXTENDED_RELEASE_TABLET | ORAL | 2 refills | Status: DC

## 2019-04-07 MED ORDER — CARVEDILOL 25 MG PO TABS: 25.0000 mg | ORAL_TABLET | Freq: Two times a day (BID) | ORAL | 0 refills | Status: DC

## 2019-04-07 MED ORDER — FUROSEMIDE 20 MG PO TABS: 20.0000 mg | ORAL_TABLET | ORAL | 0 refills | Status: DC

## 2019-04-07 MED ORDER — CEPHALEXIN 500 MG PO CAPS: 500.0000 mg | ORAL_CAPSULE | Freq: Two times a day (BID) | ORAL | 0 refills | Status: DC

## 2019-04-07 MED ORDER — LOPERAMIDE HCL 2 MG PO CAPS: 2.0000 mg | ORAL_CAPSULE | ORAL | 0 refills | Status: DC | PRN

## 2019-04-07 MED ORDER — HYDRALAZINE HCL 25 MG PO TABS: 25.0000 mg | ORAL_TABLET | Freq: Three times a day (TID) | ORAL | 0 refills | Status: DC

## 2019-04-07 NOTE — Progress Notes (Signed)
Physical Therapy Treatment Patient Details Name: [name] MRN: [id number] DOB: 11/17/1940 Today's Date: 04/07/2019    History of Present Illness Patient is a 78 year old female who suffered fall at home. She does not recall what happened just found herself on the floor. PMH to include: chronic left knee infection/pain after TKA, B THA, CAD, HTN, OA, PVD.    PT Comments    Pt supine on arrival reporting back, sacral and leg pain with limited movement since last P.T. session with use of bedpan this am. Pt educated for need to mobilize and use [facility] for toileting not bedpan. Pt agreeable and discussed with staff getting pt to toilet every 90 min grossly to assist with bladder retraining as pt states she feels like she can't tell anymore due to use of purewick. Pt with increased gait tolerance this session but fatigued after gait and unable to perform further activity. Son, [name], present throughout session with D/C plan still appropriate.  BP 129/68 supine HR 79-87 with gait    Follow Up Recommendations  SNF;Supervision for mobility/OOB     Equipment Recommendations  None recommended by PT    Recommendations for Other Services       Precautions / Restrictions Precautions Precautions: Fall Precaution Comments: check BP    Mobility  Bed Mobility Overal bed mobility: Needs Assistance Bed Mobility: Supine to Sit     Supine to sit: Min assist     General bed mobility comments: increased time with mild assist to elevate trunk with HOB 15 degrees with use of rail  Transfers Overall transfer level: Needs assistance   Transfers: Sit to/from Stand;Stand Pivot Transfers Sit to Stand: Min assist Stand pivot transfers: Min assist       General transfer comment: min assist to stand from bed with cues for hand placement, min assist to sequence pivot from bed to [facility] with assist for hand placement. Mod assist to rise from [facility] with increased time with initial LOB and cues for  grasping RW in standing with increased time to recover balance with bil UE support  Ambulation/Gait Ambulation/Gait assistance: Min assist Gait Distance (Feet): 75 Feet Assistive device: Rolling walker (2 wheeled) Gait Pattern/deviations: Step-to pattern;Trunk flexed;Antalgic   Gait velocity interpretation: <1.8 ft/sec, indicate of risk for recurrent falls General Gait Details: decreased stance on LLE with Left knee flexion contracture grossly 30 degrees. Cues for safety and RW management. Pt with slow cautious gait limited by fatigue   Stairs             Wheelchair Mobility    Modified Rankin (Stroke Patients Only)       Balance Overall balance assessment: Needs assistance Sitting-balance support: Feet supported;Single extremity supported Sitting balance-Leahy Scale: Fair     Standing balance support: Bilateral upper extremity supported Standing balance-Leahy Scale: Poor                              Cognition Arousal/Alertness: Awake/alert Behavior During Therapy: WFL for tasks assessed/performed Overall Cognitive Status: Within Functional Limits for tasks assessed                                        Exercises      General Comments        Pertinent Vitals/Pain Pain Score: 5  Pain Location: left knee Pain Descriptors / Indicators:  Discomfort;Aching;Guarding Pain Intervention(s): Limited activity within patient's tolerance;Repositioned;Monitored during session    Home Living                      Prior Function            PT Goals (current goals can now be found in the care plan section) Acute Rehab PT Goals Time For Goal Achievement: 04/21/19 Potential to Achieve Goals: Fair Progress towards PT goals: Progressing toward goals    Frequency           PT Plan Current plan remains appropriate    Co-evaluation              AM-PAC PT "6 Clicks" Mobility   Outcome Measure  Help needed turning  from your back to your side while in a flat bed without using bedrails?: A Little Help needed moving from lying on your back to sitting on the side of a flat bed without using bedrails?: A Little Help needed moving to and from a bed to a chair (including a wheelchair)?: A Little Help needed standing up from a chair using your arms (e.g., wheelchair or bedside chair)?: A Little Help needed to walk in hospital room?: A Little Help needed climbing 3-5 steps with a railing? : A Lot 6 Click Score: 17    End of Session Equipment Utilized During Treatment: Gait belt Activity Tolerance: Patient tolerated treatment well Patient left: in chair;with call bell/phone within reach;with chair alarm set;with family/visitor present Nurse Communication: Mobility status PT Visit Diagnosis: Unsteadiness on feet (R26.81);Muscle weakness (generalized) (M62.81);Difficulty in walking, not elsewhere classified (R26.2);Other abnormalities of gait and mobility (R26.89);Pain;History of falling (Z91.81)     Time: 4585-9292 PT Time Calculation (min) (ACUTE ONLY): 33 min  Charges:  $Gait Training: 8-22 mins $Therapeutic Activity: 8-22 mins                     [place], PT Acute Rehabilitation Services Pager: [phone] Office: [place] 04/07/2019, 1:11 PM

## 2019-04-07 NOTE — TOC Initial Note (Signed)
Transition of Care [facility]) - Initial/Assessment Note    Patient Details  Name: [name] MRN: [id number] Date of Birth: [birth year]  Transition of Care [facility]) CM/SW Contact:    Zenon Mayo, RN Phone Number: 04/07/2019, 1:07 PM  Clinical Narrative:                 From home alone, PT recs SNF for patient, NCM spoke with her , she states she would like to go to [facility] in [place] and that Cal is her POA , to call him about any other snf's or information.  Patient agreeable to fax out referrals to snf. Referral made pending bed offers.   Expected Discharge Plan: Skilled Nursing Facility Barriers to Discharge: No Barriers Identified   Patient Goals and CMS Choice Patient states their goals for this hospitalization and ongoing recovery are:: to get back to where she was before CMS Medicare.gov Compare Post Acute Care list provided to:: Patient Choice offered to / list presented to : NA  Expected Discharge Plan and Services Expected Discharge Plan: [place] In-house Referral: NA Discharge Planning Services: CM Consult Post Acute Care Choice: [place] Living arrangements for the past 2 months: Single Family Home Expected Discharge Date: 04/07/19               DME Arranged: (NA)         HH Arranged: NA          Prior Living Arrangements/Services Living arrangements for the past 2 months: Single Family Home Lives with:: Self Patient language and need for interpreter reviewed:: Yes Do you feel safe going back to the place where you live?: No   needs short term rehab  Need for Family Participation in Patient Care: Yes (Comment) Care giver support system in place?: Yes (comment)   Criminal Activity/Legal Involvement Pertinent to Current Situation/Hospitalization: No - Comment as needed  Activities of Daily Living Home Assistive Devices/Equipment: Walker (specify type) ADL Screening (condition at time of admission) Patient's  cognitive ability adequate to safely complete daily activities?: Yes Is the patient deaf or have difficulty hearing?: No Does the patient have difficulty seeing, even when wearing glasses/contacts?: No Does the patient have difficulty concentrating, remembering, or making decisions?: No Patient able to express need for assistance with ADLs?: No Does the patient have difficulty dressing or bathing?: No Independently performs ADLs?: Yes (appropriate for developmental age) Does the patient have difficulty walking or climbing stairs?: Yes Weakness of Legs: Both Weakness of Arms/Hands: None  Permission Sought/Granted Permission sought to share information with : Family Supports, Case Freight forwarder, Chartered certified accountant granted to share information with : Yes, Verbal Permission Granted  Share Information with NAME: Cal White  Permission granted to share info w AGENCY: SNF  Permission granted to share info w Relationship: Cal and Amie Critchley (sons)  Permission granted to share info w Contact Information: YKDXI [phone],  [facility] [phone] (POA)  Emotional Assessment Appearance:: Appears stated age Attitude/Demeanor/Rapport: Gracious Affect (typically observed): Appropriate Orientation: : Oriented to Self, Oriented to Place, Oriented to  Time, Oriented to Situation Alcohol / Substance Use: Not Applicable Psych Involvement: No (comment)  Admission diagnosis:  Diarrhea of presumed infectious origin [R19.7] Dehydration [E86.0] AKI (acute kidney injury) ([place]) [N17.9] Contusion of face, initial encounter [S00.83XA] Syncope, unspecified syncope type [R55] Leukocytosis, unspecified type [D72.829] Patient Active Problem List   Diagnosis Date Noted  . Syncope 04/01/2019  . Closed fracture of multiple pubic rami (  [place]) 07/22/2018  . Closed fracture of right superior rim of pubis ([place])   . Lateral dislocation of left patella 01/15/2017  . Unilateral primary osteoarthritis, right  knee 01/15/2017  . Osteoporosis 11/26/2016  . Acute pain of left knee 08/27/2016  . Chronic diarrhea 08/07/2016  . Left displaced femoral neck fracture ([place]) 06/26/2016  . MRSA (methicillin resistant Staphylococcus aureus) infection 06/26/2016  . Hypertensive heart disease   . Hydronephrosis   . Obstructive uropathy   . Abnormal echocardiogram 01/23/2016  . Pressure ulcer 01/21/2016  . Chronic infection of prosthetic knee ([place]) 10/12/2015  . AAA 01/19/2009  . ILIAC ARTERY ANEURYSM 01/19/2009  . Hyperlipidemia 01/14/2009  . TOBACCO ABUSE 01/14/2009  . Essential hypertension 01/14/2009  . Coronary atherosclerosis 01/14/2009  . Cerebrovascular disease 01/14/2009  . Peripheral vascular disease ([place]) 01/14/2009  . CHRONIC OBSTRUCTIVE PULMONARY DISEASE 01/14/2009   PCP:  Darreld Mclean, MD Pharmacy:   [facility] [place], [place] [address] Phone: [phone] Fax: [phone]     Social Determinants of Health (SDOH) Interventions    Readmission Risk Interventions No flowsheet data found.

## 2019-04-07 NOTE — Progress Notes (Signed)
Dressing changed on left knee not completed this morning and bactroban was not applied by this nurse since night shift Wall Santiago Glad, RN and pateint statet that ointment and dressing was applied/changed this morning. Pateint does not was dressing to be changed again this morning. Order states once daily.

## 2019-04-07 NOTE — Plan of Care (Signed)
  Problem: Safety: Goal: Ability to remain free from injury will improve Outcome: Progressing   

## 2019-04-07 NOTE — Progress Notes (Signed)
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  In the ED was noted to have acute kidney injury of creatinine of 1.79 from 0.8, leukocytosis and admitted to [facility].  The patient was admitted to a telemetry bed. She underwent an echocardiogram which demonstrated an EF of 60-65% with evidence of impaired relaxation. There is moderate mitral annular calcification present. There is mild to moderate stenosis of the aortic valve. No wall motion abnormalities. She has received IV fluids, but is now negative for orthostasis as of 04/03/2019. Urine culture has grown out proteus mirabilis. She will be continued on doxycycline for her knee wound. She is also receiving IV Ancef for her UTI.  The patient has been evaluated by physical therapy. They have recommended that she discharge to SNF. Family is in agreeement. Awaiting placement.  Consultants  . None  Procedures  . None  Antibiotics   Anti-infectives (From admission, onward)   Start     Dose/Rate Route Frequency Ordered Stop   04/07/19 0000  cephALEXin (KEFLEX) 500 MG capsule     500 mg Oral Every 12 hours 04/07/19 0948     04/06/19 1400  cephALEXin (KEFLEX) capsule 500 mg     500 mg Oral Every 12 hours 04/06/19 1348 04/09/19 0959   04/03/19 0830  ceFAZolin (ANCEF) IVPB 1 g/50 mL premix  Status:  Discontinued     1 g 100 mL/hr over 30 Minutes Intravenous Every 8 hours 04/03/19 0815 04/06/19 1348   04/03/19 0830  doxycycline (VIBRA-TABS) tablet 100 mg      100 mg Oral 2 times daily 04/03/19 0817     04/01/19 1900  rifampin (RIFADIN) capsule 300 mg     300 mg Oral 2 times daily 04/01/19 1345     04/01/19 1800  vancomycin (VANCOCIN) 50 mg/mL oral solution 125 mg  Status:  Discontinued     125 mg Oral 4 times daily 04/01/19 1758 04/03/19 1258   04/01/19 1400  doxycycline (VIBRA-TABS) tablet 100 mg  Status:  Discontinued     100 mg Oral 2 times daily 04/01/19 1345 04/03/19 0815     .   Subjective  The patient is resting comfortably. No new complaints.  Objective   Vitals:  Vitals:   04/07/19 1457 04/07/19 1716  BP: (!) 125/58   Pulse: 75 84  Resp: 18   Temp: 99.4 F (37.4 C)   SpO2: 93%     Exam:  Constitutional:  . The patient is awake, alert, and oriented x 3. No acute distress. Respiratory:  . No increased work of breathing.  . No wheezes, rales, or rhonchi. . No tactile fremitus. Cardiovascular:  . Regular, rate, and rhythm . No murmurs, ectopy, or gallups. . No lateral PMI. No thrills. Abdomen:  . Abdomen is soft, non-tender, non-distended. . No hernias, masses, or organomegaly . Normoactive bowel sounds. Musculoskeletal:  . No cyanosis, clubbing, or edema Skin:  . No rashes, lesions, ulcers . palpation of skin: no induration or nodules Neurologic:  . CN 2-12 intact . Sensation all  4 extremities intact Psychiatric:  . Mental status o Mood, affect appropriate o Orientation to person, place, time  . judgment and insight appear intact    I have personally reviewed the following:   Today's Data  . Orthoptist  . Urine cultures  Scheduled Meds: . amLODipine  10 mg Oral Daily  . aspirin EC  81 mg Oral Daily  . atorvastatin  40 mg Oral q1800  . carvedilol  25 mg Oral BID WC  . cephALEXin  500 mg Oral Q12H  . doxycycline  100 mg Oral BID  . hydrALAZINE  25 mg Oral Q8H  . losartan  25 mg Oral Daily  . magnesium oxide  200 mg Oral [id number]  . mesalamine  2.4 g Oral Q breakfast  . multivitamin with  minerals  1 tablet Oral Daily  . mupirocin cream   Topical Daily  . rifampin  300 mg Oral BID  . sodium chloride flush  3 mL Intravenous Q12H  . traMADol  50 mg Oral Daily   Continuous Infusions:   Principal Problem:   Syncope Active Problems:   Hyperlipidemia   Essential hypertension   Chronic infection of prosthetic knee (HCC)   Chronic diarrhea   LOS: 5 days   A & P   Syncope: Without significant prodrome, continue to monitor on telemetry, EKG appears unremarkable, orthostatic vitals are negative, although they were taken after the patient received IV fluid. Suspect hypovolemia could have been contributing due to worsening diarrhea and AKI on admission. Hypovolemia now resolved. Echocardiogram is unremarkable. Likely related to the patient's proteus mirabilis/e.coli UTI. She is receiving Ancef for that. Will convert to cephalexin.   Hypertension: Uncontrolled. I have increased the patient's coreg and added losartan. Monitor.  Echocardiogram was obtained that demonstrated: EF of 60-65%.evidense of impaired relaxation. No wall motion abnormalities. The patient has been evaluated by Physical therapy. They have recommend discharge to SNF. We are awaiting placement.  AKI: Patient with baseline creatinine 0.87 in April, now 1.8. Likely prerenal from worsening diarrhea, improving with hydration, hold ACE inhibitor. Creatinine improved 0.94.  Acute on chronic diarrhea: Has frequent diarrhea at baseline possibly malabsorption or from long term antibiotic use, worsened in the last 4 to 5 days.C Diff not tested as the patient has had a normal bowel movement. C Diff cancelled. Takes mesalamine chronically this was continued.   Abnormal urinalysis: Urine culture has grown out Proteus mirabilis and E.Coli. She has been continued on IV Ancef while awaiting sensitivities.  History of knee infection: Has a prosthetic knee infection with MRSA, on chronic suppressive antibiotic therapyShe  continues to see Dr. Megan James, continue both doxycycline and rifampin, Dr. Inda James discussed with Dr. Megan James yesterday who will arrange follow-up in a week. Wound care consult.  HTN: Continue Norvasc, Coreg. Hold Lisinopril given AKI.  HLD: Continue Lipitor  I have seen and examined this patient myself. I have spent 30 minutes in her evaluation and care.  DVT prophylaxis:SCDs, add lovenox when plts better Code Status:DNR Family Communication: Called and updated son [name] Disposition Plan:Home once clinically improved   Farhan Jean, DO [facility] Direct contact: see [email or web address]  7PM-7AM contact night coverage as above 04/07/2019, 6:57 PM  LOS: 1 day

## 2019-04-07 NOTE — Progress Notes (Signed)
Patient re-swabed for COVID this morning. No bed in SNF available today per SW. Paged MD and inform to cancel d/c for today.

## 2019-04-07 NOTE — Discharge Summary (Signed)
Physician Discharge Summary  [name] FGH:[id number] DOB: July 27, 1941 DOA: 04/01/2019  PCP: Darreld Mclean, MD  Admit date: 04/01/2019 Discharge date: 04/07/2019  Recommendations for Outpatient Follow-up:  1. PT/OT at facility 2. Complete antibiotics as previously instructed. 3. Check chemistry on 04/10/2019. Report results to facility physician. 4. Follow up with orthopedic surgery as directed. 5. Follow up with Dr. Megan Salon in one week. 6. Follow up with PCP in 7-10 days after discharge from rehab.  Follow-up Information    Copland, Gay Filler, MD.   Specialty: Family Medicine Contact information: [address] [place] [address] [phone]          Discharge Diagnoses: Principal diagnosis is #1 1. Syncope - likely due to Sepsis from UTI. Orthostatics and echo were unremarkable. Blood cultures x 2 have had no growth. Urine culture has grown out p. Mirabilis. 2. Hypertension: Poor control. Coreg increased. Hydralazine added.  3. Diastolic dysfunction - chronic and stable 4. AKI: Due to diarrhea. Resolved. Lasix has been restarted at QOD dosing. 5. Chronic diarrhea. Loperamide prn. Stool studies negative. No diarrhea observed here. 6. MRSA infection of left knee. Followed by Dr .Megan Salon. She is on chronic doxycycline and rifampin by mouth. She will follow up with Dr. Megan Salon in one week.   Discharge Condition: Fair Disposition: SNF  Diet recommendation: Heart healthy  Filed Weights   04/05/19 0608 04/06/19 0503 04/07/19 0529  Weight: 52.2 kg 53.1 kg 54 kg    History of present illness:  [name] is a 78 y.o. female with medical history significant of CAD s/p stents; HTN; HLD; and chronic L knee MRSA infection on suppressive antibiotics presenting with a fall.  Patient has a "tremendous medical record."  She has a black eye.  She was feeling so good and getting around so good the week before that happened.  She fell and got a black eye.   She got up and tried to get back in the bed and couldn't, she couldn't move.  She thought she would be ok this morning and she wasn't and so her sons made her come in.  She fell last night.  She has "almost a standing appointment with the orthopedic place."  She felt fine yesterday before she fell and she has no idea why she fell.  She doesn't remember anything about it.  She felt fine and was getting ready for bed and then found herself in the floor.  She awoke about 0400 in the bathroom floor.  She crawled herself back to bed.  She has a life alert but did not push it.  She was unable to get into bed and went back to sleep in the floor.  When she woke up, she was in the floor still and so pushed her button.  She has had diarrhea for many years, but it is worse now.  She wears Depends at home.  She has 2 stools every morning - one as soon as she gets up and another immediately after eating.  She has stools again immediately after lunch and dinner.  She usually has 4 stools a day every day.  Yesterday she had more stools - she ate more and had additional stools.  ED Course:  Syncope, AKI, ?C diff.  Chronic loose stools but excessive diarrhea overnight.  She woke up in the bathroom floor last night, doesn't remember falling.  Has chronic knee infection, on doxy and Rifampin chronically.  No UA back, creatinine 1.79  up from 0.8.  Hospital Course:  The patient was admitted to a telemetry bed. She underwent an echocardiogram which demonstrated an EF of 60-65% with evidence of impaired relaxation. There is moderate mitral annular calcification present. There is mild to moderate stenosis of the aortic valve. No wall motion abnormalities. She has received IV fluids, but is now negative for orthostasis as of 04/03/2019. Urine culture has grown out proteus mirabilis. She has been converted to oral keflex. From IV ancef. She will be continued on doxycycline and rifampin  for her knee wound.  The patient has been  evaluated by physical therapy. They have recommended that she discharge to SNF. Family is in agreeement. Awaiting placement.  Today's assessment: S: The patient is sitting up in bed. No new complaints. O: Vitals:  Vitals:   04/07/19 0529 04/07/19 0849  BP: (!) 191/68 (!) 147/58  Pulse: 82 86  Resp: 15   Temp: 98.4 F (36.9 C) 97.6 F (36.4 C)  SpO2: 94% 96%    Constitutional:   The patient is awake, alert, and oriented x 3. No acute distress. Respiratory:   No increased work of breathing.   No wheezes, rales, or rhonchi.  No tactile fremitus. Cardiovascular:   Regular, rate, and rhythm  No murmurs, ectopy, or gallups.  No lateral PMI. No thrills. Abdomen:   Abdomen is soft, non-tender, non-distended.  No hernias, masses, or organomegaly  Normoactive bowel sounds. Musculoskeletal:   No cyanosis, clubbing, or edema Skin:   No rashes, lesions, ulcers  palpation of skin: no induration or nodules Neurologic:   CN 2-12 intact  Sensation all 4 extremities intact Psychiatric:   Mental status ? Mood, affect appropriate ? Orientation to person, place, time   judgment and insight appear intact    Discharge Instructions  Discharge Instructions    Activity as tolerated - No restrictions   Complete by: As directed    Call MD for:  redness, tenderness, or signs of infection (pain, swelling, redness, odor or green/yellow discharge around incision site)   Complete by: As directed    Call MD for:  severe uncontrolled pain   Complete by: As directed    Call MD for:  temperature >100.4   Complete by: As directed    Diet - low sodium heart healthy   Complete by: As directed    Discharge instructions   Complete by: As directed    PT/OT at SNF Continue antibiotics as previously instructed.  Follow up with PCP in 7-10 days after discharge from SNF. Follow up with orthopedic surgery as directed. Have chemistry drawn and reported to facility physician on  04/10/2019   Increase activity slowly   Complete by: As directed      Allergies as of 04/07/2019   No Known Allergies     Medication List    STOP taking these medications   naproxen sodium 220 MG tablet Commonly known as: ALEVE     TAKE these medications   amLODipine 10 MG tablet Commonly known as: NORVASC Take 1 tablet (10 mg total) by mouth daily.   aspirin EC 81 MG tablet Take 81 mg by mouth daily.   atorvastatin 40 MG tablet Commonly known as: LIPITOR TAKE ONE TABLET BY MOUTH DAILY What changed: when to take this   CALCIUM 1200 PO Take 1 tablet by mouth daily.   carvedilol 25 MG tablet Commonly known as: COREG Take 1 tablet (25 mg total) by mouth 2 (two) times daily with a meal. What changed:  medication strength  how much to take  when to take this   cephALEXin 500 MG capsule Commonly known as: KEFLEX Take 1 capsule (500 mg total) by mouth every 12 (twelve) hours.   diclofenac sodium 1 % Gel Commonly known as: VOLTAREN Apply 2 g topically 4 (four) times daily. Use as needed for joint pain.  Max 32g/day total   doxycycline 100 MG tablet Commonly known as: VIBRA-TABS TAKE ONE TABLET BY MOUTH TWICE A DAY   furosemide 20 MG tablet Commonly known as: LASIX Take 1 tablet (20 mg total) by mouth every other day. What changed: when to take this   hydrALAZINE 25 MG tablet Commonly known as: APRESOLINE Take 1 tablet (25 mg total) by mouth every 8 (eight) hours.   lisinopril 40 MG tablet Commonly known as: ZESTRIL Take 1 tablet (40 mg total) by mouth daily.   loperamide 2 MG capsule Commonly known as: IMODIUM Take 1 capsule (2 mg total) by mouth as needed for diarrhea or loose stools (1 after each loose stool.).   Mag-Oxide 200 MG Tabs Generic drug: Magnesium Oxide Take 200 mg by mouth every morning.   mesalamine 1.2 g EC tablet Commonly known as: LIALDA Take 2.4 g by mouth daily with breakfast.   multivitamin with minerals Tabs tablet Take 1  tablet by mouth daily.   potassium chloride SA 20 MEQ tablet Commonly known as: K-DUR Take 1 tablet (20 mEq total) by mouth every other day. With lasix. What changed:   when to take this  additional instructions   PROBIOTIC DAILY PO Take 1 capsule by mouth daily.   rifampin 300 MG capsule Commonly known as: RIFADIN Take 300 mg by mouth 2 (two) times daily.   traMADol 50 MG tablet Commonly known as: ULTRAM Take 1 tablet (50 mg total) by mouth 2 (two) times daily. What changed: when to take this      No Known Allergies  The results of significant diagnostics from this hospitalization (including imaging, microbiology, ancillary and laboratory) are listed below for reference.    Significant Diagnostic Studies: Dg Shoulder Right  Result Date: 04/01/2019 CLINICAL DATA:  Fall EXAM: RIGHT SHOULDER - 2+ VIEW COMPARISON:  None. FINDINGS: Examination is limited by positioning and provided views; all views provided for review are significantly axillary and therefore the glenohumeral joint is not well evaluated. There is no obvious fracture or dislocation. There is mild acromioclavicular arthrosis. The partially imaged right chest is unremarkable. IMPRESSION: Examination is limited by positioning and provided views; all views provided for review are significantly axillary and therefore the glenohumeral joint is not well evaluated. There is no obvious fracture or dislocation. There is mild acromioclavicular arthrosis. Electronically Signed   By: Eddie Candle M.D.   On: 04/01/2019 09:56   Ct Head Wo Contrast  Result Date: 04/01/2019 CLINICAL DATA:  78 year old female with head, face and neck injury from fall. Initial encounter. EXAM: CT HEAD WITHOUT CONTRAST CT MAXILLOFACIAL WITHOUT CONTRAST CT CERVICAL SPINE WITHOUT CONTRAST TECHNIQUE: Multidetector CT imaging of the head, cervical spine, and maxillofacial structures were performed using the standard protocol without intravenous contrast.  Multiplanar CT image reconstructions of the cervical spine and maxillofacial structures were also generated. COMPARISON:  03/04/2016 head CT FINDINGS: CT HEAD FINDINGS Brain: No evidence of acute infarction, hemorrhage, hydrocephalus, extra-axial collection or mass lesion/mass effect. 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 Vascular: Carotid and vertebral atherosclerotic calcifications noted. Skull: Normal. Negative  for fracture or focal lesion. Other: RIGHT forehead soft tissue swelling noted. CT MAXILLOFACIAL FINDINGS Osseous: No fracture or mandibular dislocation. No destructive process. Orbits: Negative. No traumatic or inflammatory finding. Sinuses: Clear. Soft tissues: RIGHT preseptal facial and forehead soft tissue swelling noted. CT CERVICAL SPINE FINDINGS Alignment: Normal. Skull base and vertebrae: No acute fracture. No primary bone lesion or focal pathologic process. Soft tissues and spinal canal: No prevertebral fluid or swelling. No visible canal hematoma. Disc levels: Mild multilevel degenerative disc disease and moderate multilevel facet arthropathy noted. Upper chest: Emphysema changes identified. No acute abnormality. Minimal LEFT apical scarring noted. Other: None IMPRESSION: 1. No evidence of acute intracranial abnormality. Atrophy, chronic small-vessel white matter ischemic changes and remote infarcts. 2. RIGHT facial/forehead soft tissue swelling without fracture. 3. No static evidence of acute injury to the cervical spine. Multilevel degenerative changes. 4.  Emphysema (ICD10-J43.9). Electronically Signed   By: Margarette Canada M.D.   On: 04/01/2019 10:12   Ct Cervical Spine Wo Contrast  Result Date: 04/01/2019 CLINICAL DATA:  78 year old female with head, face and neck injury from fall. Initial encounter. EXAM: CT HEAD WITHOUT CONTRAST CT MAXILLOFACIAL WITHOUT CONTRAST CT CERVICAL SPINE WITHOUT  CONTRAST TECHNIQUE: Multidetector CT imaging of the head, cervical spine, and maxillofacial structures were performed using the standard protocol without intravenous contrast. Multiplanar CT image reconstructions of the cervical spine and maxillofacial structures were also generated. COMPARISON:  03/04/2016 head CT FINDINGS: CT HEAD FINDINGS Brain: No evidence of acute infarction, hemorrhage, hydrocephalus, extra-axial collection or mass lesion/mass effect. 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 Vascular: Carotid and vertebral atherosclerotic calcifications noted. Skull: Normal. Negative for fracture or focal lesion. Other: RIGHT forehead soft tissue swelling noted. CT MAXILLOFACIAL FINDINGS Osseous: No fracture or mandibular dislocation. No destructive process. Orbits: Negative. No traumatic or inflammatory finding. Sinuses: Clear. Soft tissues: RIGHT preseptal facial and forehead soft tissue swelling noted. CT CERVICAL SPINE FINDINGS Alignment: Normal. Skull base and vertebrae: No acute fracture. No primary bone lesion or focal pathologic process. Soft tissues and spinal canal: No prevertebral fluid or swelling. No visible canal hematoma. Disc levels: Mild multilevel degenerative disc disease and moderate multilevel facet arthropathy noted. Upper chest: Emphysema changes identified. No acute abnormality. Minimal LEFT apical scarring noted. Other: None IMPRESSION: 1. No evidence of acute intracranial abnormality. Atrophy, chronic small-vessel white matter ischemic changes and remote infarcts. 2. RIGHT facial/forehead soft tissue swelling without fracture. 3. No static evidence of acute injury to the cervical spine. Multilevel degenerative changes. 4.  Emphysema (ICD10-J43.9). Electronically Signed   By: Margarette Canada M.D.   On: 04/01/2019 10:12   Dg Chest Portable 1 View  Result Date: 04/01/2019 CLINICAL DATA:   Leukocytosis. Fell. EXAM: PORTABLE CHEST 1 VIEW COMPARISON:  06/27/2016. FINDINGS: Stable mildly enlarged cardiac silhouette and tortuous and calcified thoracic aorta. The lungs are mildly hyperexpanded with mildly prominent interstitial markings. Diffuse osteopenia. Mild bilateral shoulder degenerative changes. Dense bilateral carotid artery calcifications. IMPRESSION: 1. No acute abnormality. 2. Stable mild cardiomegaly and mild changes of COPD. 3. Dense bilateral carotid artery atheromatous calcifications. Electronically Signed   By: Claudie Revering M.D.   On: 04/01/2019 11:00   Ct Maxillofacial Wo Contrast  Result Date: 04/01/2019 CLINICAL DATA:  78 year old female with head, face and neck injury from fall. Initial encounter. EXAM: CT HEAD WITHOUT CONTRAST CT MAXILLOFACIAL WITHOUT CONTRAST CT CERVICAL SPINE WITHOUT CONTRAST TECHNIQUE: Multidetector CT imaging of the head, cervical spine, and  maxillofacial structures were performed using the standard protocol without intravenous contrast. Multiplanar CT image reconstructions of the cervical spine and maxillofacial structures were also generated. COMPARISON:  03/04/2016 head CT FINDINGS: CT HEAD FINDINGS Brain: No evidence of acute infarction, hemorrhage, hydrocephalus, extra-axial collection or mass lesion/mass effect. 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 Vascular: Carotid and vertebral atherosclerotic calcifications noted. Skull: Normal. Negative for fracture or focal lesion. Other: RIGHT forehead soft tissue swelling noted. CT MAXILLOFACIAL FINDINGS Osseous: No fracture or mandibular dislocation. No destructive process. Orbits: Negative. No traumatic or inflammatory finding. Sinuses: Clear. Soft tissues: RIGHT preseptal facial and forehead soft tissue swelling noted. CT CERVICAL SPINE FINDINGS Alignment: Normal. Skull base and vertebrae: No acute fracture. No  primary bone lesion or focal pathologic process. Soft tissues and spinal canal: No prevertebral fluid or swelling. No visible canal hematoma. Disc levels: Mild multilevel degenerative disc disease and moderate multilevel facet arthropathy noted. Upper chest: Emphysema changes identified. No acute abnormality. Minimal LEFT apical scarring noted. Other: None IMPRESSION: 1. No evidence of acute intracranial abnormality. Atrophy, chronic small-vessel white matter ischemic changes and remote infarcts. 2. RIGHT facial/forehead soft tissue swelling without fracture. 3. No static evidence of acute injury to the cervical spine. Multilevel degenerative changes. 4.  Emphysema (ICD10-J43.9). Electronically Signed   By: Margarette Canada M.D.   On: 04/01/2019 10:12    Microbiology: Recent Results (from the past 240 hour(s))  SARS CORONAVIRUS 2 (TAT 6-24 HRS)     Status: None   Collection Time: 04/01/19 11:45 AM  Result Value Ref Range Status   SARS Coronavirus 2 NEGATIVE NEGATIVE Final    Comment: (NOTE) SARS-CoV-2 target nucleic acids are NOT DETECTED. The SARS-CoV-2 RNA is generally detectable in upper and lower respiratory specimens during the acute phase of infection. Negative results do not preclude SARS-CoV-2 infection, do not rule out co-infections with other pathogens, and should not be used as the sole basis for treatment or other patient management decisions. Negative results must be combined with clinical observations, patient history, and epidemiological information. The expected result is Negative. Fact Sheet for Patients: SugarRoll.be Fact Sheet for Healthcare Providers: https://www.woods-mathews.com/ This test is not yet approved or cleared by the [place] FDA and  has been authorized for detection and/or diagnosis of SARS-CoV-2 by FDA under an Emergency Use Authorization (EUA). This EUA will remain  in effect (meaning this test can be used) for the  duration of the COVID-19 declaration under Section 56 4(b)(1) of the Act, 21 U.S.C. section 360bbb-3(b)(1), unless the authorization is terminated or revoked sooner. Performed at [facility], [place] [address], [place], [place] [address]   Urine culture     Status: Abnormal   Collection Time: 04/01/19  5:38 PM   Specimen: Urine, Clean Catch  Result Value Ref Range Status   Specimen Description URINE, CLEAN CATCH  Final   Special Requests   Final    NONE Performed at [facility], [place] [address], [place], [place] [address]    Culture (A)  Final    >=100,000 COLONIES/mL PROTEUS MIRABILIS 60,000 COLONIES/mL ESCHERICHIA COLI    Report Status 04/04/2019 FINAL  Final   Organism ID, Bacteria PROTEUS MIRABILIS (A)  Final   Organism ID, Bacteria ESCHERICHIA COLI (A)  Final      Susceptibility   Escherichia coli - MIC*    AMPICILLIN 4 SENSITIVE Sensitive     CEFAZOLIN <=4 SENSITIVE Sensitive  CEFTRIAXONE <=1 SENSITIVE Sensitive     CIPROFLOXACIN <=0.25 SENSITIVE Sensitive     GENTAMICIN <=1 SENSITIVE Sensitive     IMIPENEM <=0.25 SENSITIVE Sensitive     NITROFURANTOIN <=16 SENSITIVE Sensitive     TRIMETH/SULFA <=20 SENSITIVE Sensitive     AMPICILLIN/SULBACTAM 4 SENSITIVE Sensitive     PIP/TAZO <=4 SENSITIVE Sensitive     Extended ESBL NEGATIVE Sensitive     * 60,000 COLONIES/mL ESCHERICHIA COLI   Proteus mirabilis - MIC*    AMPICILLIN <=2 SENSITIVE Sensitive     CEFAZOLIN <=4 SENSITIVE Sensitive     CEFTRIAXONE <=1 SENSITIVE Sensitive     CIPROFLOXACIN <=0.25 SENSITIVE Sensitive     GENTAMICIN <=1 SENSITIVE Sensitive     IMIPENEM 2 SENSITIVE Sensitive     NITROFURANTOIN 128 RESISTANT Resistant     TRIMETH/SULFA <=20 SENSITIVE Sensitive     AMPICILLIN/SULBACTAM <=2 SENSITIVE Sensitive     PIP/TAZO <=4 SENSITIVE Sensitive     * >=100,000 COLONIES/mL PROTEUS MIRABILIS  MRSA PCR Screening     Status: None   Collection Time: 04/02/19 12:45 AM   Specimen:  Nasopharyngeal  Result Value Ref Range Status   MRSA by PCR NEGATIVE NEGATIVE Final    Comment:        The GeneXpert MRSA Assay (FDA approved for NASAL specimens only), is one component of a comprehensive MRSA colonization surveillance program. It is not intended to diagnose MRSA infection nor to guide or monitor treatment for MRSA infections. Performed at [facility], [place] [address] [place], [place] [address]      Labs: Basic Metabolic Panel: Recent Labs  Lab 04/01/19 0933 04/02/19 0809 04/03/19 0524 04/04/19 0555 04/05/19 0711  NA 140 137 137 137 136  K 4.0 3.9 3.8 3.1* 4.1  CL 104 106 104 103 104  CO2 22 20* 24 21* 23  GLUCOSE 136* 103* 97 82 105*  BUN 28* 31* 19 14 14   CREATININE 1.79* 1.36* 1.04* 0.97 0.94  CALCIUM 9.6 8.7* 8.8* 8.7* 8.8*   Liver Function Tests: Recent Labs  Lab 04/01/19 0933  AST 52*  ALT 24  ALKPHOS 118  BILITOT 1.7*  PROT 6.0*  ALBUMIN 3.0*   No results for input(s): LIPASE, AMYLASE in the last 168 hours. No results for input(s): AMMONIA in the last 168 hours. CBC: Recent Labs  Lab 04/01/19 0933 04/02/19 0809 04/03/19 0524 04/04/19 0555  WBC 20.3* 15.8* 13.3* 9.0  NEUTROABS 18.7*  --   --  6.5  HGB 13.4 12.3 12.1 11.9*  HCT 36.1 34.0* 36.7 34.6*  MCV 104.0* 103.0* 99.7 97.7  PLT 121* 108* 114* 116*   Cardiac Enzymes: Recent Labs  Lab 04/01/19 0933  CKTOTAL 457*   BNP: BNP (last 3 results) No results for input(s): BNP in the last 8760 hours.  ProBNP (last 3 results) No results for input(s): PROBNP in the last 8760 hours.  CBG: No results for input(s): GLUCAP in the last 168 hours.  Principal Problem:   Syncope Active Problems:   Hyperlipidemia   Essential hypertension   Chronic infection of prosthetic knee (HCC)   Chronic diarrhea   Time coordinating discharge: 38 minutes.  Signed:        Kingdom Vanzanten, DO [facility]  04/07/2019, 9:49 AM

## 2019-04-07 NOTE — NC FL2 (Signed)
[place] LEVEL OF CARE SCREENING TOOL     IDENTIFICATION  Patient Name: [name] Birthdate: 07/17/1941 Sex: female Admission Date (Current Location): 04/01/2019  [facility] and [place] Number:  Herbalist and Address:  [facility], [place] [address], [place], [place] [address]      Provider Number: [id number]  Attending Physician Name and Address:  Karie Kirks, DO  Relative Name and Phone Number:  [name] [facility]) [phone]    Current Level of Care: Hospital Recommended Level of Care: [place] Prior Approval Number:    Date Approved/Denied:   PASRR Number: [id number]  Discharge Plan: SNF    Current Diagnoses: Patient Active Problem List   Diagnosis Date Noted  . Syncope 04/01/2019  . Closed fracture of multiple pubic rami ([place]) 07/22/2018  . Closed fracture of right superior rim of pubis ([place])   . Lateral dislocation of left patella 01/15/2017  . Unilateral primary osteoarthritis, right knee 01/15/2017  . Osteoporosis 11/26/2016  . Acute pain of left knee 08/27/2016  . Chronic diarrhea 08/07/2016  . Left displaced femoral neck fracture ([place]) 06/26/2016  . MRSA (methicillin resistant Staphylococcus aureus) infection 06/26/2016  . Hypertensive heart disease   . Hydronephrosis   . Obstructive uropathy   . Abnormal echocardiogram 01/23/2016  . Pressure ulcer 01/21/2016  . Chronic infection of prosthetic knee ([place]) 10/12/2015  . AAA 01/19/2009  . ILIAC ARTERY ANEURYSM 01/19/2009  . Hyperlipidemia 01/14/2009  . TOBACCO ABUSE 01/14/2009  . Essential hypertension 01/14/2009  . Coronary atherosclerosis 01/14/2009  . Cerebrovascular disease 01/14/2009  . Peripheral vascular disease ([place]) 01/14/2009  . CHRONIC OBSTRUCTIVE PULMONARY DISEASE 01/14/2009    Orientation RESPIRATION BLADDER Height & Weight     Self, Time, Situation, Place  Normal Incontinent Weight: 54 kg Height:  5\' 3"  (160 cm)   BEHAVIORAL SYMPTOMS/MOOD NEUROLOGICAL BOWEL NUTRITION STATUS      Continent Diet(See discharge Summary)  AMBULATORY STATUS COMMUNICATION OF NEEDS Skin   Limited Assist Verbally Other (Comment)(closed surgical incision on left knee, dry dressing)                       Personal Care Assistance Level of Assistance  Bathing, Feeding, Dressing Bathing Assistance: Limited assistance Feeding assistance: Independent Dressing Assistance: Limited assistance     Functional Limitations Info  Sight, Hearing, Speech Sight Info: Adequate Hearing Info: Adequate Speech Info: Adequate    SPECIAL CARE FACTORS FREQUENCY  PT (By licensed PT), OT (By licensed OT)     PT Frequency: 5x/week OT Frequency: 5x/week            Contractures Contractures Info: Not present    Additional Factors Info  Code Status, Allergies Code Status Info: DNR Allergies Info: no known allergies           Current Medications (04/07/2019):  This is the current hospital active medication list Current Facility-Administered Medications  Medication Dose Route Frequency Provider Last Rate Last Dose  . acetaminophen (TYLENOL) tablet 650 mg  650 mg Oral Q6H PRN Karmen Bongo, MD   650 mg at 04/03/19 1846   Or  . acetaminophen (TYLENOL) suppository 650 mg  650 mg Rectal Q6H PRN Karmen Bongo, MD      . amLODipine (NORVASC) tablet 10 mg  10 mg Oral Daily Karmen Bongo, MD   10 mg at 04/07/19 0924  . aspirin EC tablet 81 mg  81 mg Oral Daily Karmen Bongo, MD  81 mg at 04/07/19 0924  . atorvastatin (LIPITOR) tablet 40 mg  40 mg Oral q1800 Karmen Bongo, MD   40 mg at 04/06/19 1710  . carvedilol (COREG) tablet 25 mg  25 mg Oral BID WC Swayze, Ava, DO   25 mg at 04/07/19 0924  . cephALEXin (KEFLEX) capsule 500 mg  500 mg Oral Q12H Swayze, Ava, DO   500 mg at 04/07/19 0924  . doxycycline (VIBRA-TABS) tablet 100 mg  100 mg Oral BID Swayze, Ava, DO   100 mg at 04/07/19 0924  . hydrALAZINE (APRESOLINE) tablet 25  mg  25 mg Oral Q8H Swayze, Ava, DO   25 mg at 04/07/19 1259  . loperamide (IMODIUM) capsule 2 mg  2 mg Oral PRN Swayze, Ava, DO      . losartan (COZAAR) tablet 25 mg  25 mg Oral Daily Swayze, Ava, DO   25 mg at 04/07/19 0924  . magnesium oxide (MAG-OX) tablet 200 mg  200 mg Oral Ledell Noss, MD   200 mg at 04/07/19 0604  . mesalamine (LIALDA) EC tablet 2.4 g  2.4 g Oral Q breakfast Karmen Bongo, MD   2.4 g at 04/07/19 0925  . metoprolol tartrate (LOPRESSOR) injection 5 mg  5 mg Intravenous Q5 min PRN Lovey Newcomer T, NP   5 mg at 04/05/19 0702  . morphine 2 MG/ML injection 2 mg  2 mg Intravenous Q2H PRN Karmen Bongo, MD   2 mg at 04/06/19 1942  . multivitamin with minerals tablet 1 tablet  1 tablet Oral Daily Karmen Bongo, MD   1 tablet at 04/07/19 [phone]  . mupirocin cream (BACTROBAN) 2 %   Topical Daily Karmen Bongo, MD      . ondansetron [facility]) tablet 4 mg  4 mg Oral Q6H PRN Karmen Bongo, MD       Or  . ondansetron [facility]) injection 4 mg  4 mg Intravenous Q6H PRN Karmen Bongo, MD      . rifampin (RIFADIN) capsule 300 mg  300 mg Oral BID Karmen Bongo, MD   300 mg at 04/07/19 0926  . sodium chloride flush (NS) 0.9 % injection 3 mL  3 mL Intravenous Q12H Karmen Bongo, MD   3 mL at 04/07/19 0927  . traMADol (ULTRAM) tablet 50 mg  50 mg Oral Daily Karmen Bongo, MD   50 mg at 04/07/19 9935     Discharge Medications: Please see discharge summary for a list of discharge medications.  Relevant Imaging Results:  Relevant Lab Results:   Additional Information [id number]  Zenon Mayo, RN

## 2019-04-08 ENCOUNTER — Inpatient Hospital Stay (HOSPITAL_COMMUNITY): Payer: Medicare Other

## 2019-04-08 ENCOUNTER — Ambulatory Visit: Payer: Medicare Other | Admitting: Physician Assistant

## 2019-04-08 ENCOUNTER — Ambulatory Visit: Payer: Medicare Other | Admitting: Internal Medicine

## 2019-04-08 DIAGNOSIS — Z20828 Contact with and (suspected) exposure to other viral communicable diseases: Secondary | ICD-10-CM | POA: Diagnosis not present

## 2019-04-08 DIAGNOSIS — T8459XS Infection and inflammatory reaction due to other internal joint prosthesis, sequela: Secondary | ICD-10-CM

## 2019-04-08 DIAGNOSIS — I2510 Atherosclerotic heart disease of native coronary artery without angina pectoris: Secondary | ICD-10-CM | POA: Diagnosis not present

## 2019-04-08 DIAGNOSIS — I10 Essential (primary) hypertension: Secondary | ICD-10-CM | POA: Diagnosis not present

## 2019-04-08 DIAGNOSIS — I420 Dilated cardiomyopathy: Secondary | ICD-10-CM | POA: Diagnosis not present

## 2019-04-08 DIAGNOSIS — M810 Age-related osteoporosis without current pathological fracture: Secondary | ICD-10-CM | POA: Diagnosis not present

## 2019-04-08 DIAGNOSIS — Z7982 Long term (current) use of aspirin: Secondary | ICD-10-CM | POA: Diagnosis not present

## 2019-04-08 DIAGNOSIS — R531 Weakness: Secondary | ICD-10-CM | POA: Diagnosis not present

## 2019-04-08 DIAGNOSIS — G8929 Other chronic pain: Secondary | ICD-10-CM | POA: Diagnosis not present

## 2019-04-08 DIAGNOSIS — R55 Syncope and collapse: Secondary | ICD-10-CM | POA: Diagnosis not present

## 2019-04-08 DIAGNOSIS — N179 Acute kidney failure, unspecified: Secondary | ICD-10-CM | POA: Diagnosis not present

## 2019-04-08 DIAGNOSIS — L97829 Non-pressure chronic ulcer of other part of left lower leg with unspecified severity: Secondary | ICD-10-CM | POA: Diagnosis not present

## 2019-04-08 DIAGNOSIS — S0011XD Contusion of right eyelid and periocular area, subsequent encounter: Secondary | ICD-10-CM | POA: Diagnosis not present

## 2019-04-08 DIAGNOSIS — S81002D Unspecified open wound, left knee, subsequent encounter: Secondary | ICD-10-CM | POA: Diagnosis not present

## 2019-04-08 DIAGNOSIS — E785 Hyperlipidemia, unspecified: Secondary | ICD-10-CM | POA: Diagnosis not present

## 2019-04-08 DIAGNOSIS — W19XXXA Unspecified fall, initial encounter: Secondary | ICD-10-CM | POA: Diagnosis not present

## 2019-04-08 DIAGNOSIS — U071 COVID-19: Secondary | ICD-10-CM | POA: Diagnosis not present

## 2019-04-08 DIAGNOSIS — W19XXXD Unspecified fall, subsequent encounter: Secondary | ICD-10-CM | POA: Diagnosis not present

## 2019-04-08 DIAGNOSIS — M2550 Pain in unspecified joint: Secondary | ICD-10-CM | POA: Diagnosis not present

## 2019-04-08 DIAGNOSIS — Z7401 Bed confinement status: Secondary | ICD-10-CM | POA: Diagnosis not present

## 2019-04-08 DIAGNOSIS — M549 Dorsalgia, unspecified: Secondary | ICD-10-CM | POA: Diagnosis not present

## 2019-04-08 DIAGNOSIS — Z955 Presence of coronary angioplasty implant and graft: Secondary | ICD-10-CM | POA: Diagnosis not present

## 2019-04-08 DIAGNOSIS — R011 Cardiac murmur, unspecified: Secondary | ICD-10-CM | POA: Diagnosis not present

## 2019-04-08 DIAGNOSIS — I6529 Occlusion and stenosis of unspecified carotid artery: Secondary | ICD-10-CM | POA: Diagnosis not present

## 2019-04-08 DIAGNOSIS — T8454XD Infection and inflammatory reaction due to internal left knee prosthesis, subsequent encounter: Secondary | ICD-10-CM | POA: Diagnosis not present

## 2019-04-08 DIAGNOSIS — Z792 Long term (current) use of antibiotics: Secondary | ICD-10-CM | POA: Diagnosis not present

## 2019-04-08 DIAGNOSIS — I739 Peripheral vascular disease, unspecified: Secondary | ICD-10-CM | POA: Diagnosis not present

## 2019-04-08 DIAGNOSIS — K5190 Ulcerative colitis, unspecified, without complications: Secondary | ICD-10-CM | POA: Diagnosis not present

## 2019-04-08 DIAGNOSIS — R5381 Other malaise: Secondary | ICD-10-CM | POA: Diagnosis not present

## 2019-04-08 DIAGNOSIS — M1990 Unspecified osteoarthritis, unspecified site: Secondary | ICD-10-CM | POA: Diagnosis not present

## 2019-04-08 DIAGNOSIS — B9562 Methicillin resistant Staphylococcus aureus infection as the cause of diseases classified elsewhere: Secondary | ICD-10-CM | POA: Diagnosis not present

## 2019-04-08 LAB — CBC
HCT: 38.3 % (ref 36.0–46.0)
Hemoglobin: 13 g/dL (ref 12.0–15.0)
MCH: 32.7 pg (ref 26.0–34.0)
MCHC: 33.9 g/dL (ref 30.0–36.0)
MCV: 96.5 fL (ref 80.0–100.0)
Platelets: 286 10*3/uL (ref 150–400)
RBC: 3.97 MIL/uL (ref 3.87–5.11)
RDW: 13.2 % (ref 11.5–15.5)
WBC: 9.7 10*3/uL (ref 4.0–10.5)
nRBC: 0 % (ref 0.0–0.2)

## 2019-04-08 LAB — BASIC METABOLIC PANEL
Anion gap: 9 (ref 5–15)
BUN: 17 mg/dL (ref 8–23)
CO2: 24 mmol/L (ref 22–32)
Calcium: 9 mg/dL (ref 8.9–10.3)
Chloride: 102 mmol/L (ref 98–111)
Creatinine, Ser: 0.85 mg/dL (ref 0.44–1.00)
GFR calc Af Amer: >60 mL/min (ref >60)
GFR calc non Af Amer: >60 mL/min (ref >60)
Glucose, Bld: 134 mg/dL — ABNORMAL HIGH (ref 70–99)
Potassium: 4.1 mmol/L (ref 3.5–5.1)
Sodium: 135 mmol/L (ref 135–145)

## 2019-04-08 MED ORDER — TRAMADOL HCL 50 MG PO TABS: 50.0000 mg | ORAL_TABLET | Freq: Every day | ORAL | 0 refills | Status: DC

## 2019-04-08 NOTE — Progress Notes (Signed)
Physical Therapy Treatment Patient Details Name: [name] MRN: [id number] DOB: [birth year] Today's Date: 04/08/2019    History of Present Illness Patient is a 78 year old female who suffered fall at home. She does not recall what happened just found herself on the floor. PMH to include: chronic left knee infection/pain after TKA, B THA, CAD, HTN, OA, PVD.    PT Comments    Pt is agreeable to therapy today. With bed mobility pt is found to be incontinent of urine. Pt limited limited in safe mobility today by pain in low back and R heel as well as decreased strength, and ROM. Pt becomes tearful because she is frustrated by how hard it has been to work with therapy during this hospital visit. Pt is minA for supine to sit, min A for sit>stand from elevated surface, able to stand for about 3 minutes for donning briefs and then has to get back to bed due to LBP. Pt asks therapist to look at her R heel and it is found to be non blanching and boggy. Foam dressing applied. Pt educated on need to offload heel while in the bed to reduce risk of pressure injury. Pt to d/c to SNF this afternoon.      Follow Up Recommendations  SNF;Supervision for mobility/OOB     Equipment Recommendations  None recommended by PT       Precautions / Restrictions Precautions Precautions: Fall Precaution Comments: check BP    Mobility  Bed Mobility Overal bed mobility: Needs Assistance Bed Mobility: Supine to Sit;Sit to Supine     Supine to sit: Min assist Sit to supine: Mod assist   General bed mobility comments: minA for supine to sit for bringing trunk to upright, pt with increased back pain with sitting, mod A for LE mangement into bed  Transfers Overall transfer level: Needs assistance   Transfers: Sit to/from Stand;Stand Pivot Transfers Sit to Stand: Min assist         General transfer comment: min A for standing from bed, unable to clear hips on first attempt, raised bed and pt able to  achieve standing long enough for pulling up her briefs and for replacement of the bed pad under her  Ambulation/Gait             General Gait Details: unable to ambulate today due to R heel pain          Balance Overall balance assessment: Needs assistance Sitting-balance support: Feet supported;Single extremity supported Sitting balance-Leahy Scale: Fair     Standing balance support: Bilateral upper extremity supported Standing balance-Leahy Scale: Poor                              Cognition Arousal/Alertness: Awake/alert Behavior During Therapy: WFL for tasks assessed/performed Overall Cognitive Status: Within Functional Limits for tasks assessed                                           General Comments General comments (skin integrity, edema, etc.): skin in tact, however R heel is non blanching and boggy, applied foam pad to heel and instructed pt in floating heel so that it does not continue to have pressure on it.       Pertinent Vitals/Pain Pain Assessment: 0-10 Pain Score: 7  Pain Location: left knee right heel  and back Pain Descriptors / Indicators: Discomfort;Aching;Guarding Pain Intervention(s): Limited activity within patient's tolerance;Monitored during session;Repositioned           PT Goals (current goals can now be found in the care plan section) Acute Rehab PT Goals PT Goal Formulation: With patient Time For Goal Achievement: 04/21/19 Potential to Achieve Goals: Fair Progress towards PT goals: Not progressing toward goals - comment(pt limited by pain in back and R heel today)    Frequency    Min 3X/week      PT Plan Current plan remains appropriate       AM-PAC PT "6 Clicks" Mobility   Outcome Measure  Help needed turning from your back to your side while in a flat bed without using bedrails?: A Little Help needed moving from lying on your back to sitting on the side of a flat bed without using  bedrails?: A Little Help needed moving to and from a bed to a chair (including a wheelchair)?: A Little Help needed standing up from a chair using your arms (e.g., wheelchair or bedside chair)?: A Little Help needed to walk in hospital room?: A Little Help needed climbing 3-5 steps with a railing? : A Lot 6 Click Score: 17    End of Session Equipment Utilized During Treatment: Gait belt Activity Tolerance: Patient tolerated treatment well Patient left: in chair;with call bell/phone within reach;with chair alarm set;with family/visitor present Nurse Communication: Mobility status PT Visit Diagnosis: Unsteadiness on feet (R26.81);Muscle weakness (generalized) (M62.81);Difficulty in walking, not elsewhere classified (R26.2);Other abnormalities of gait and mobility (R26.89);Pain;History of falling (Z91.81)     Time: 6237-6283 PT Time Calculation (min) (ACUTE ONLY): 22 min  Charges:  $Therapeutic Activity: 8-22 mins                     Michal Strzelecki B. Migdalia Dk PT, DPT Acute Rehabilitation Services Pager [phone] Office [phone]    [place] 04/08/2019, 3:10 PM

## 2019-04-08 NOTE — TOC Transition Note (Addendum)
Transition of Care [facility]) - CM/SW Discharge Note   Patient Details  Name: [name] MRN: [id number] Date of Birth: August 23, 1940  Transition of [facility]) CM/SW Contact:  Alberteen Sam, [place] Phone Number: [phone] 04/08/2019, 9:11 AM   Clinical Narrative:     Patient will DC to: [facility] Anticipated DC date: 04/08/2019 Family notified: [name] [facility]) Transport by: Corey Harold  Per MD patient ready for DC to [facility] . RN, patient, patient's family, and facility notified of DC. Discharge Summary sent to facility. RN given number for report  [phone] Room 302. DC packet on chart. Ambulance transport requested for patient for 3:00 pm or once MD inform's patient is clear to DC and DC summary is updated.  CSW signing off.  [place], [place]    Final next level of care: Skilled Nursing Facility Barriers to Discharge: No Barriers Identified   Patient Goals and CMS Choice Patient states their goals for this hospitalization and ongoing recovery are:: to go to rehab then home CMS Medicare.gov Compare Post Acute Care list provided to:: Patient Represenative (must comment)([place] (on and POA)) Choice offered to / list presented to : Adult Children  Discharge Placement PASRR number recieved: 04/07/19            Patient chooses bed at: Other - please specify in the comment section below:([facility]) Patient to be transferred to facility by: [place] Name of family member notified: [name] [facility]) Patient and family notified of of transfer: 04/08/19  Discharge Plan and Services In-house Referral: NA Discharge Planning Services: CM Consult Post Acute Care Choice: [place]          DME Arranged: (NA)         HH Arranged: NA          Social Determinants of Health (SDOH) Interventions     Readmission Risk Interventions No flowsheet data found.

## 2019-04-08 NOTE — Discharge Instructions (Signed)

## 2019-04-08 NOTE — Progress Notes (Signed)
Carotid duplex       has been completed. Preliminary results can be found under CV proc through chart review. Thomasene Dubow, BS, RDMS, RVT   

## 2019-04-08 NOTE — Progress Notes (Signed)
Patient d/c to [facility], report given to nurse Jerene Pitch. Transported by [facility].

## 2019-04-08 NOTE — Discharge Summary (Addendum)
Physician Discharge Summary  [name] DDU:[id number] DOB: 1940/09/24  PCP: Darreld Mclean, MD  Admitted from: Home Discharged to: SNF  Admit date: 04/01/2019 Discharge date: 04/08/2019  Recommendations for Outpatient Follow-up:    Contact information for follow-up providers    Schedule an appointment as soon as possible for a visit with Copland, Gay Filler, MD.   Specialty: Family Medicine Why: Upon discharge from SNF. Contact information: [place] [address] [phone]        MD at SNF. Schedule an appointment as soon as possible for a visit today.   Why: To be seen in 2 to 3 days with repeat labs (CBC & CMP).       Michel Bickers, MD. Schedule an appointment as soon as possible for a visit.   Specialty: Infectious Diseases Why: Patient reportedly had an appointment for today which she missed.  Dr. Megan Salon was contacted yesterday and assured that his office will arrange follow-up.  Please coordinate. Contact information: [address] [place] [address] [phone]        Mcarthur Rossetti, MD. Schedule an appointment as soon as possible for a visit.   Specialty: Orthopedic Surgery Contact information: [place] [address] [phone]            Contact information for after-discharge care    [facility] .   Service: Skilled Nursing Contact information: [address] [place] [phone]                 Please arrange outpatient Vascular Surgery consultation for evaluation and follow-up of carotid stenosis noted on carotid duplex. Recommend repeating urine microscopy in a couple of weeks to reassess for microscopic hematuria and if persists consider urology consultation.  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  Assessment and plan:  1. Syncope: Unclear etiology but suspect related to worsening diarrhea, related dehydration and may have had orthostatic changes at home in the context of ongoing antihypertensives.  No prodrome and patient has no recollection of event.  Had some right periorbital ecchymosis.  CT head and neck without acute abnormalities.  Telemetry showed sinus rhythm without arrhythmias or pauses.  EKG unremarkable.  Orthostatic vitals were negative although they were taken after IV fluids.  TTE shows normal EF and rest of results as noted below.  Less likely related to Proteus mirabilis/E. coli UTI which has now been treated.  Although patient had carotid Dopplers in November 2019 that showed bilateral 1-39% stenosis, given her presentation with syncope and chest x-ray reported as "dense bilateral carotid artery atheromatous calcifications", repeated carotid duplex which shows bilateral 1-39% ICA stenosis, upper end of range and right ECA appears >50% stenosed, right subclavian artery was stenotic.  May consider  outpatient vascular surgery consultation and follow-up.  May also consider outpatient cardiology evaluation of mild to moderate aortic stenosis although both these are felt less likely by themselves as cause of her syncope.  Patient has been instructed not to drive for  6 months.  I reviewed the carotid Doppler results with the Vascular Surgeon on-call who indicates that she has mild ICA stenosis, vertebral artery flow is antegrade and no concern for posterior circulation compromise and no intervention recommended at this time. 2. Acute kidney injury: Baseline creatinine 0.87 in April.  Presented with creatinine of 1.8.  Mildly elevated CK.  Likely related to prerenal azotemia from increased GI losses.  Resolved after IV fluids. 3. Acute on chronic diarrhea: Reports longstanding diarrhea with BMs immediately after waking up and after each meal.  Takes mesalamine.  Unclear as to her underlying diagnosis and unable to say if she sees GI.  Diarrhea reportedly worse somewhat prior to admission.  Low index of suspicion for C. difficile.  Patient clearly states that she has not had diarrhea since hospitalization.  Continue prior home dose of mesalamine and probiotics and consider outpatient GI consultation if not undertaken already. 4. Chronic prosthetic left knee MRSA infection: Admitting hospitalist discussed with Dr. Megan Salon who will arrange follow-up in his office.  Patient missed an appointment that she had with him today.  Continue dual antibiotics i.e. doxycycline and rifampin for suppressive therapy.  Patient reportedly also follows with Dr. Ninfa Linden, orthopedics.  No acute infection or worsening.  Patient appears to be on chronic tramadol PTA.  [place] controlled substance database and had failed 14 tablets of tramadol on 8/24 with 1 refill.  Since she is going to SNF, provided with short course prescription until SNF MD follow-up. 5. Essential hypertension: Lisinopril was temporarily held due to acute kidney injury and resumed at discharge.  Due to uncontrolled BP, carvedilol was increased from 12.5 twice daily to 25 twice daily and hydralazine was added to amlodipine.  Also her Lasix dose has been reduced.  Unclear as to why she is on Lasix, defer to PCP  follow-up as outpatient.  Monitor closely. 6. Hyperlipidemia: Continue statins. 7. Proteus mirabilis and E. coli UTI: Completed 1 week course of IV antibiotics.  Asymptomatic of dysuria.  Completed course of antibiotics for this indication.  I do not believe that she met sepsis criteria on admission.  Urine microscopy had shown some microscopic hematuria and recommend repeating urine microscopy in a couple of weeks and if this persists then recommend outpatient urology consultation. 8. CAD status post stents: No chest pains.  Continue aspirin, statins and beta-blockers. 9. Thrombocytopenia: Unclear etiology.  Resolved.   Consultations:  None  Procedures:  Carotid Dopplers 04/08/2019: Summary: Right Carotid: Velocities in the right ICA are consistent with a 1-39% stenosis.                Upper end of range. The ECA appears >50% stenosed.  Left Carotid: Velocities in the left ICA are consistent with a 1-39% stenosis.               Upper end of range.  Subclavians: Right subclavian artery was stenotic. Normal flow hemodynamics were              seen in the left subclavian artery.  TTE 04/03/2019: IMPRESSIONS    1. The left ventricle has normal systolic function with an ejection fraction of 60-65%. The cavity size was normal. Left ventricular diastolic Doppler parameters are consistent with  impaired relaxation. Indeterminate filling pressures The E/e' is 8-15.  No evidence of left ventricular regional wall motion abnormalities.  2. The right ventricle has normal systolic function. The cavity was normal. There is no increase in right ventricular wall thickness.  3. The mitral valve is abnormal. Mild thickening of the mitral valve leaflet. There is moderate mitral annular calcification present.  4. The tricuspid valve is grossly normal. Tricuspid valve regurgitation is mild-moderate.  5. The aortic valve is tricuspid. Moderate sclerosis of the aortic valve. Aortic valve regurgitation is  mild to moderate by color flow Doppler. Mild-moderate stenosis of the aortic valve.  6. The aorta is normal unless otherwise noted.  7. The inferior vena cava was dilated in size with >50% respiratory variability.  SUMMARY   LVEF 60-65%, normal wall thickness, normal wall motion, grade 1 DD, indeterminate LV filling pressure, normal RV function, calcified aortic valve with mild to moderate stenosis - mean gradient 14 mmHG, AVA 1.5-1.6 cm2, mild to moderate AI, trace to mild MR with MAC, normal biatrial size, mild to moderate TR, RVSP 42 mmHg, dilated IVC   Discharge Instructions  Discharge Instructions    (HEART FAILURE PATIENTS) Call MD:  Anytime you have any of the following symptoms: 1) 3 pound weight gain in 24 hours or 5 pounds in 1 week 2) shortness of breath, with or without a dry hacking cough 3) swelling in the hands, feet or stomach 4) if you have to sleep on extra pillows at night in order to breathe.   Complete by: As directed    Activity as tolerated - No restrictions   Complete by: As directed    Call MD for:   Complete by: As directed    Passing out or feeling like passing out.   Call MD for:  extreme fatigue   Complete by: As directed    Call MD for:  persistant dizziness or light-headedness   Complete by: As directed    Call MD for:  redness, tenderness, or signs of infection (pain, swelling, redness, odor or green/yellow discharge around incision site)   Complete by: As directed    Call MD for:  severe uncontrolled pain   Complete by: As directed    Call MD for:  temperature >100.4   Complete by: As directed    Diet - low sodium heart healthy   Complete by: As directed    Discharge instructions   Complete by: As directed    PT/OT at SNF Continue antibiotics as previously instructed.  Follow up with PCP in 7-10 days after discharge from SNF. Follow up with orthopedic surgery as directed. Have chemistry drawn and reported to facility physician on 04/10/2019    Driving Restrictions   Complete by: As directed    No driving for 6 months.   Increase activity slowly   Complete by: As directed        Medication List    STOP taking these medications   naproxen sodium 220 MG tablet Commonly known as: ALEVE     TAKE these medications   amLODipine 10 MG tablet Commonly known as: NORVASC Take 1 tablet (10 mg total) by mouth daily.   aspirin EC 81 MG tablet Take 81 mg by mouth daily.   atorvastatin 40 MG tablet Commonly known as: LIPITOR TAKE ONE TABLET BY MOUTH DAILY What changed: when to take this   CALCIUM 1200 PO Take 1 tablet by mouth daily.   carvedilol 25 MG tablet Commonly known as: COREG  Take 1 tablet (25 mg total) by mouth 2 (two) times daily with a meal. What changed:   medication strength  how much to take  when to take this   diclofenac sodium 1 % Gel Commonly known as: VOLTAREN Apply 2 g topically 4 (four) times daily. Use as needed for joint pain.  Max 32g/day total   doxycycline 100 MG tablet Commonly known as: VIBRA-TABS TAKE ONE TABLET BY MOUTH TWICE A DAY   furosemide 20 MG tablet Commonly known as: LASIX Take 1 tablet (20 mg total) by mouth every other day. What changed: when to take this   hydrALAZINE 25 MG tablet Commonly known as: APRESOLINE Take 1 tablet (25 mg total) by mouth every 8 (eight) hours.   lisinopril 40 MG tablet Commonly known as: ZESTRIL Take 1 tablet (40 mg total) by mouth daily.   Mag-Oxide 200 MG Tabs Generic drug: Magnesium Oxide Take 200 mg by mouth every morning.   mesalamine 1.2 g EC tablet Commonly known as: LIALDA Take 2.4 g by mouth daily with breakfast.   multivitamin with minerals Tabs tablet Take 1 tablet by mouth daily.   potassium chloride SA 20 MEQ tablet Commonly known as: K-DUR Take 1 tablet (20 mEq total) by mouth every other day. With lasix. What changed:   when to take this  additional instructions   PROBIOTIC DAILY PO Take 1 capsule by mouth  daily.   rifampin 300 MG capsule Commonly known as: RIFADIN Take 300 mg by mouth 2 (two) times daily.   traMADol 50 MG tablet Commonly known as: ULTRAM Take 1 tablet (50 mg total) by mouth daily.      No Known Allergies    Procedures/Studies: Dg Shoulder Right  Result Date: 04/01/2019 CLINICAL DATA:  Fall EXAM: RIGHT SHOULDER - 2+ VIEW COMPARISON:  None. FINDINGS: Examination is limited by positioning and provided views; all views provided for review are significantly axillary and therefore the glenohumeral joint is not well evaluated. There is no obvious fracture or dislocation. There is mild acromioclavicular arthrosis. The partially imaged right chest is unremarkable. IMPRESSION: Examination is limited by positioning and provided views; all views provided for review are significantly axillary and therefore the glenohumeral joint is not well evaluated. There is no obvious fracture or dislocation. There is mild acromioclavicular arthrosis. Electronically Signed   By: Eddie Candle M.D.   On: 04/01/2019 09:56   Ct Head Wo Contrast  Result Date: 04/01/2019 CLINICAL DATA:  78 year old female with head, face and neck injury from fall. Initial encounter. EXAM: CT HEAD WITHOUT CONTRAST CT MAXILLOFACIAL WITHOUT CONTRAST CT CERVICAL SPINE WITHOUT CONTRAST TECHNIQUE: Multidetector CT imaging of the head, cervical spine, and maxillofacial structures were performed using the standard protocol without intravenous contrast. Multiplanar CT image reconstructions of the cervical spine and maxillofacial structures were also generated. COMPARISON:  03/04/2016 head CT FINDINGS: CT HEAD FINDINGS Brain: No evidence of acute infarction, hemorrhage, hydrocephalus, extra-axial collection or mass lesion/mass effect. 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 Vascular: Carotid and vertebral atherosclerotic calcifications  noted. Skull: Normal. Negative for fracture or focal lesion. Other: RIGHT forehead soft tissue swelling noted. CT MAXILLOFACIAL FINDINGS Osseous: No fracture or mandibular dislocation. No destructive process. Orbits: Negative. No traumatic or inflammatory finding. Sinuses: Clear. Soft tissues: RIGHT preseptal facial and forehead soft tissue swelling noted. CT CERVICAL SPINE FINDINGS Alignment: Normal. Skull base and vertebrae: No acute fracture. No primary bone lesion or focal pathologic  process. Soft tissues and spinal canal: No prevertebral fluid or swelling. No visible canal hematoma. Disc levels: Mild multilevel degenerative disc disease and moderate multilevel facet arthropathy noted. Upper chest: Emphysema changes identified. No acute abnormality. Minimal LEFT apical scarring noted. Other: None IMPRESSION: 1. No evidence of acute intracranial abnormality. Atrophy, chronic small-vessel white matter ischemic changes and remote infarcts. 2. RIGHT facial/forehead soft tissue swelling without fracture. 3. No static evidence of acute injury to the cervical spine. Multilevel degenerative changes. 4.  Emphysema (ICD10-J43.9). Electronically Signed   By: Margarette Canada M.D.   On: 04/01/2019 10:12   Ct Cervical Spine Wo Contrast  Result Date: 04/01/2019 CLINICAL DATA:  78 year old female with head, face and neck injury from fall. Initial encounter. EXAM: CT HEAD WITHOUT CONTRAST CT MAXILLOFACIAL WITHOUT CONTRAST CT CERVICAL SPINE WITHOUT CONTRAST TECHNIQUE: Multidetector CT imaging of the head, cervical spine, and maxillofacial structures were performed using the standard protocol without intravenous contrast. Multiplanar CT image reconstructions of the cervical spine and maxillofacial structures were also generated. COMPARISON:  03/04/2016 head CT FINDINGS: CT HEAD FINDINGS Brain: No evidence of acute infarction, hemorrhage, hydrocephalus, extra-axial collection or mass lesion/mass effect. 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 Vascular: Carotid and vertebral atherosclerotic calcifications noted. Skull: Normal. Negative for fracture or focal lesion. Other: RIGHT forehead soft tissue swelling noted. CT MAXILLOFACIAL FINDINGS Osseous: No fracture or mandibular dislocation. No destructive process. Orbits: Negative. No traumatic or inflammatory finding. Sinuses: Clear. Soft tissues: RIGHT preseptal facial and forehead soft tissue swelling noted. CT CERVICAL SPINE FINDINGS Alignment: Normal. Skull base and vertebrae: No acute fracture. No primary bone lesion or focal pathologic process. Soft tissues and spinal canal: No prevertebral fluid or swelling. No visible canal hematoma. Disc levels: Mild multilevel degenerative disc disease and moderate multilevel facet arthropathy noted. Upper chest: Emphysema changes identified. No acute abnormality. Minimal LEFT apical scarring noted. Other: None IMPRESSION: 1. No evidence of acute intracranial abnormality. Atrophy, chronic small-vessel white matter ischemic changes and remote infarcts. 2. RIGHT facial/forehead soft tissue swelling without fracture. 3. No static evidence of acute injury to the cervical spine. Multilevel degenerative changes. 4.  Emphysema (ICD10-J43.9). Electronically Signed   By: Margarette Canada M.D.   On: 04/01/2019 10:12   Dg Chest Portable 1 View  Result Date: 04/01/2019 CLINICAL DATA:  Leukocytosis. Fell. EXAM: PORTABLE CHEST 1 VIEW COMPARISON:  06/27/2016. FINDINGS: Stable mildly enlarged cardiac silhouette and tortuous and calcified thoracic aorta. The lungs are mildly hyperexpanded with mildly prominent interstitial markings. Diffuse osteopenia. Mild bilateral shoulder degenerative changes. Dense bilateral carotid artery calcifications. IMPRESSION: 1. No acute abnormality. 2. Stable mild cardiomegaly and mild changes of COPD. 3. Dense bilateral carotid artery  atheromatous calcifications. Electronically Signed   By: Claudie Revering M.D.   On: 04/01/2019 11:00   Vas US Carotid  Result Date: 04/08/2019 Carotid Arterial Duplex Study Indications:       Carotid artery disease and Syncope. Risk Factors:      Hypertension, hyperlipidemia, coronary artery disease. Comparison Study:  06/03/18 ICA 1-39% Performing Technologist: June Leap RDMS, RVT  Examination Guidelines: A complete evaluation includes B-mode imaging, spectral Doppler, color Doppler, and power Doppler as needed of all accessible portions of each vessel. Bilateral testing is considered an integral part of a complete examination. Limited examinations for reoccurring indications may be performed as noted.  Right Carotid Findings: +----------+--------+--------+--------+------------------+--------+           PSV cm/sEDV cm/sStenosisPlaque  DescriptionComments +----------+--------+--------+--------+------------------+--------+ CCA Prox  68      17      <50%    heterogenous               +----------+--------+--------+--------+------------------+--------+ CCA Mid   122     26      <50%    heterogenous               +----------+--------+--------+--------+------------------+--------+ CCA Distal93      19      <50%    heterogenous               +----------+--------+--------+--------+------------------+--------+ ICA Prox  154     28      1-39%   heterogenous               +----------+--------+--------+--------+------------------+--------+ ICA Distal69      26                                         +----------+--------+--------+--------+------------------+--------+ ECA       260     28      >50%    heterogenous               +----------+--------+--------+--------+------------------+--------+ +----------+--------+-------+--------+-------------------+           PSV cm/sEDV cmsDescribeArm Pressure (mmHG) +----------+--------+-------+--------+-------------------+  [id number]            Stenotic                    +----------+--------+-------+--------+-------------------+ +---------+--------+--+--------+--+----------------------+ VertebralPSV cm/s69EDV cm/s11Antegrade and Atypical +---------+--------+--+--------+--+----------------------+  Left Carotid Findings: +----------+--------+--------+--------+------------------+--------+           PSV cm/sEDV cm/sStenosisPlaque DescriptionComments +----------+--------+--------+--------+------------------+--------+ CCA Prox  103     14      <50%    heterogenous               +----------+--------+--------+--------+------------------+--------+ CCA Mid   109     15      <50%    heterogenous               +----------+--------+--------+--------+------------------+--------+ CCA Distal147     22      <50%    heterogenous               +----------+--------+--------+--------+------------------+--------+ ICA Prox  176     32      1-39%   heterogenous               +----------+--------+--------+--------+------------------+--------+ ICA Distal77      18                                         +----------+--------+--------+--------+------------------+--------+ ECA       369     22      >50%    heterogenous               +----------+--------+--------+--------+------------------+--------+ +----------+--------+--------+----------------+-------------------+           PSV cm/sEDV cm/sDescribe        Arm Pressure (mmHG) +----------+--------+--------+----------------+-------------------+ [id number]             Multiphasic, WNL                    +----------+--------+--------+----------------+-------------------+ +---------+--------+--+--------+-+---------+ VertebralPSV cm/s16EDV cm/s2Antegrade +---------+--------+--+--------+-+---------+  Summary: Right Carotid: Velocities in the right ICA are consistent  with a 1-39% stenosis.                Upper end of range. The ECA  appears >50% stenosed. Left Carotid: Velocities in the left ICA are consistent with a 1-39% stenosis.               Upper end of range. Subclavians: Right subclavian artery was stenotic. Normal flow hemodynamics were              seen in the left subclavian artery. *See table(s) above for measurements and observations.     Preliminary    Ct Maxillofacial Wo Contrast  Result Date: 04/01/2019 CLINICAL DATA:  78 year old female with head, face and neck injury from fall. Initial encounter. EXAM: CT HEAD WITHOUT CONTRAST CT MAXILLOFACIAL WITHOUT CONTRAST CT CERVICAL SPINE WITHOUT CONTRAST TECHNIQUE: Multidetector CT imaging of the head, cervical spine, and maxillofacial structures were performed using the standard protocol without intravenous contrast. Multiplanar CT image reconstructions of the cervical spine and maxillofacial structures were also generated. COMPARISON:  03/04/2016 head CT FINDINGS: CT HEAD FINDINGS Brain: No evidence of acute infarction, hemorrhage, hydrocephalus, extra-axial collection or mass lesion/mass effect. 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 Vascular: Carotid and vertebral atherosclerotic calcifications noted. Skull: Normal. Negative for fracture or focal lesion. Other: RIGHT forehead soft tissue swelling noted. CT MAXILLOFACIAL FINDINGS Osseous: No fracture or mandibular dislocation. No destructive process. Orbits: Negative. No traumatic or inflammatory finding. Sinuses: Clear. Soft tissues: RIGHT preseptal facial and forehead soft tissue swelling noted. CT CERVICAL SPINE FINDINGS Alignment: Normal. Skull base and vertebrae: No acute fracture. No primary bone lesion or focal pathologic process. Soft tissues and spinal canal: No prevertebral fluid or swelling. No visible canal hematoma. Disc levels: Mild multilevel degenerative disc disease and moderate multilevel facet arthropathy noted.  Upper chest: Emphysema changes identified. No acute abnormality. Minimal LEFT apical scarring noted. Other: None IMPRESSION: 1. No evidence of acute intracranial abnormality. Atrophy, chronic small-vessel white matter ischemic changes and remote infarcts. 2. RIGHT facial/forehead soft tissue swelling without fracture. 3. No static evidence of acute injury to the cervical spine. Multilevel degenerative changes. 4.  Emphysema (ICD10-J43.9). Electronically Signed   By: Margarette Canada M.D.   On: 04/01/2019 10:12      Subjective: Patient reports some chronic low back pain and chronic left knee pain, not significantly worse than prior.  Denies any other complaints.  Denies chest pain, dyspnea, palpitations, dizziness or lightheadedness.  States that she had an appointment today with Dr. Megan Salon.  Aware of discharge plans to SNF.  Discharge Exam:  Vitals:   04/07/19 2022 04/08/19 0359 04/08/19 0800 04/08/19 1158  BP: 131/70 (!) 160/62 (!) 145/54 (!) 117/54  Pulse: 71 78 85 75  Resp: _0 Temp: 98.5 F (36.9 C) 99.3 F (37.4 C) 99 F (37.2 C) 97.8 F (36.6 C)  TempSrc: Oral Oral Oral Oral  SpO2: 95% 95% 93% 94%  Weight:  49.9 kg    Height:        General: Pleasant elderly female, moderately built and nourished sitting up comfortably in bed this morning getting ready to eat her breakfast. HEENT: Mild right periorbital ecchymosis.  Pupils equally reacting to light and accommodation. Neck: Right carotid bruit. Cardiovascular: S1 & S2 heard, RRR, S1/S2 +. No murmurs, rubs, gallops or clicks. No JVD or pedal edema.  Telemetry personally reviewed: Sinus rhythm without arrhythmias. Respiratory: Clear to auscultation without  wheezing, rhonchi or crackles. No increased work of breathing. Abdominal:  Non distended, non tender & soft. No organomegaly or masses appreciated. Normal bowel sounds heard. CNS: Alert and oriented. No focal deficits. Extremities: no edema, no cyanosis.  Took down left  knee dressing which was clean and dry.  Has approximately quarter size superficial wound over lateral aspect of the left mid knee without features of acute infection including drainage, fluctuation or erythema.  Symmetric 5 x 5 power.    The results of significant diagnostics from this hospitalization (including imaging, microbiology, ancillary and laboratory) are listed below for reference.     Microbiology: Recent Results (from the past 240 hour(s))  SARS CORONAVIRUS 2 (TAT 6-24 HRS)     Status: None   Collection Time: 04/01/19 11:45 AM  Result Value Ref Range Status   SARS Coronavirus 2 NEGATIVE NEGATIVE Final    Comment: (NOTE) SARS-CoV-2 target nucleic acids are NOT DETECTED. The SARS-CoV-2 RNA is generally detectable in upper and lower respiratory specimens during the acute phase of infection. Negative results do not preclude SARS-CoV-2 infection, do not rule out co-infections with other pathogens, and should not be used as the sole basis for treatment or other patient management decisions. Negative results must be combined with clinical observations, patient history, and epidemiological information. The expected result is Negative. Fact Sheet for Patients: SugarRoll.be Fact Sheet for Healthcare Providers: https://www.woods-mathews.com/ This test is not yet approved or cleared by the [place] FDA and  has been authorized for detection and/or diagnosis of SARS-CoV-2 by FDA under an Emergency Use Authorization (EUA). This EUA will remain  in effect (meaning this test can be used) for the duration of the COVID-19 declaration under Section 56 4(b)(1) of the Act, 21 U.S.C. section 360bbb-3(b)(1), unless the authorization is terminated or revoked sooner. Performed at [facility], [place] [address], [place], [place] [address]   Urine culture     Status: Abnormal   Collection Time: 04/01/19  5:38 PM   Specimen: Urine, Clean Catch   Result Value Ref Range Status   Specimen Description URINE, CLEAN CATCH  Final   Special Requests   Final    NONE Performed at [facility], [place] [address] [place], [place] [address]    Culture (A)  Final    >=100,000 COLONIES/mL PROTEUS MIRABILIS 60,000 COLONIES/mL ESCHERICHIA COLI    Report Status 04/04/2019 FINAL  Final   Organism ID, Bacteria PROTEUS MIRABILIS (A)  Final   Organism ID, Bacteria ESCHERICHIA COLI (A)  Final      Susceptibility   Escherichia coli - MIC*    AMPICILLIN 4 SENSITIVE Sensitive     CEFAZOLIN <=4 SENSITIVE Sensitive     CEFTRIAXONE <=1 SENSITIVE Sensitive     CIPROFLOXACIN <=0.25 SENSITIVE Sensitive     GENTAMICIN <=1 SENSITIVE Sensitive     IMIPENEM <=0.25 SENSITIVE Sensitive     NITROFURANTOIN <=16 SENSITIVE Sensitive     TRIMETH/SULFA <=20 SENSITIVE Sensitive     AMPICILLIN/SULBACTAM 4 SENSITIVE Sensitive     PIP/TAZO <=4 SENSITIVE Sensitive     Extended ESBL NEGATIVE Sensitive     * 60,000 COLONIES/mL ESCHERICHIA COLI   Proteus mirabilis - MIC*    AMPICILLIN <=2 SENSITIVE Sensitive     CEFAZOLIN <=4 SENSITIVE Sensitive     CEFTRIAXONE <=1 SENSITIVE Sensitive     CIPROFLOXACIN <=0.25 SENSITIVE Sensitive     GENTAMICIN <=1 SENSITIVE Sensitive     IMIPENEM 2 SENSITIVE Sensitive     NITROFURANTOIN  128 RESISTANT Resistant     TRIMETH/SULFA <=20 SENSITIVE Sensitive     AMPICILLIN/SULBACTAM <=2 SENSITIVE Sensitive     PIP/TAZO <=4 SENSITIVE Sensitive     * >=100,000 COLONIES/mL PROTEUS MIRABILIS  MRSA PCR Screening     Status: None   Collection Time: 04/02/19 12:45 AM   Specimen: Nasopharyngeal  Result Value Ref Range Status   MRSA by PCR NEGATIVE NEGATIVE Final    Comment:        The GeneXpert MRSA Assay (FDA approved for NASAL specimens only), is one component of a comprehensive MRSA colonization surveillance program. It is not intended to diagnose MRSA infection nor to guide or monitor treatment for MRSA infections. Performed  at [facility], [place] [address], [place], [place] [address]   SARS CORONAVIRUS 2 (TAT 6-24 HRS) Nasopharyngeal Nasopharyngeal Swab     Status: None   Collection Time: 04/07/19 11:35 AM   Specimen: Nasopharyngeal Swab  Result Value Ref Range Status   SARS Coronavirus 2 NEGATIVE NEGATIVE Final    Comment: (NOTE) SARS-CoV-2 target nucleic acids are NOT DETECTED. The SARS-CoV-2 RNA is generally detectable in upper and lower respiratory specimens during the acute phase of infection. Negative results do not preclude SARS-CoV-2 infection, do not rule out co-infections with other pathogens, and should not be used as the sole basis for treatment or other patient management decisions. Negative results must be combined with clinical observations, patient history, and epidemiological information. The expected result is Negative. Fact Sheet for Patients: SugarRoll.be Fact Sheet for Healthcare Providers: https://www.woods-mathews.com/ This test is not yet approved or cleared by the [place] FDA and  has been authorized for detection and/or diagnosis of SARS-CoV-2 by FDA under an Emergency Use Authorization (EUA). This EUA will remain  in effect (meaning this test can be used) for the duration of the COVID-19 declaration under Section 56 4(b)(1) of the Act, 21 U.S.C. section 360bbb-3(b)(1), unless the authorization is terminated or revoked sooner. Performed at [facility], [place] [address], [place], [place] [address]      Labs: CBC: Recent Labs  Lab 04/02/19 0809 04/03/19 0524 04/04/19 0555 04/08/19 0818  WBC 15.8* 13.3* 9.0 9.7  NEUTROABS  --   --  6.5  --   HGB 12.3 12.1 11.9* 13.0  HCT 34.0* 36.7 34.6* 38.3  MCV 103.0* 99.7 97.7 96.5  PLT 108* 114* 116* 294   Basic Metabolic Panel: Recent Labs  Lab 04/02/19 0809 04/03/19 0524 04/04/19 0555 04/05/19 0711 04/08/19 0818  NA 137 137 137 136 135  K 3.9 3.8 3.1* 4.1 4.1   CL 106 104 103 104 102  CO2 20* 24 21* 23 24  GLUCOSE 103* 97 82 105* 134*  BUN 31* _0 CREATININE 1.36* 1.04* 0.97 0.94 0.85  CALCIUM 8.7* 8.8* 8.7* 8.8* 9.0   Urinalysis    Component Value Date/Time   COLORURINE AMBER (A) 04/01/2019 1730   APPEARANCEUR CLOUDY (A) 04/01/2019 1730   LABSPEC 1.019 04/01/2019 1730   PHURINE 5.0 04/01/2019 1730   GLUCOSEU NEGATIVE 04/01/2019 1730   HGBUR MODERATE (A) 04/01/2019 1730   BILIRUBINUR NEGATIVE 04/01/2019 1730   KETONESUR NEGATIVE 04/01/2019 1730   PROTEINUR 100 (A) 04/01/2019 1730   UROBILINOGEN 0.2 09/19/2011 1703   NITRITE NEGATIVE 04/01/2019 1730   LEUKOCYTESUR LARGE (A) 04/01/2019 1730    I discussed in detail with patient's son via phone, updated care and answered all questions.  Time coordinating discharge: 45 minutes  SIGNED:  Vernell Leep, MD, FACP, [facility]  To contact the attending provider between 7A-7P or the covering provider during after hours 7P-7A, please log into the web site [email or web address] and access using universal Commodore password for that web site. If you do not have the password, please call the hospital operator.

## 2019-04-09 DIAGNOSIS — B9562 Methicillin resistant Staphylococcus aureus infection as the cause of diseases classified elsewhere: Secondary | ICD-10-CM | POA: Diagnosis not present

## 2019-04-09 DIAGNOSIS — S0011XD Contusion of right eyelid and periocular area, subsequent encounter: Secondary | ICD-10-CM | POA: Diagnosis not present

## 2019-04-09 DIAGNOSIS — R55 Syncope and collapse: Secondary | ICD-10-CM | POA: Diagnosis not present

## 2019-04-09 DIAGNOSIS — T8454XD Infection and inflammatory reaction due to internal left knee prosthesis, subsequent encounter: Secondary | ICD-10-CM | POA: Diagnosis not present

## 2019-04-10 DIAGNOSIS — I6529 Occlusion and stenosis of unspecified carotid artery: Secondary | ICD-10-CM | POA: Diagnosis not present

## 2019-04-10 DIAGNOSIS — R5381 Other malaise: Secondary | ICD-10-CM | POA: Diagnosis not present

## 2019-04-10 DIAGNOSIS — N179 Acute kidney failure, unspecified: Secondary | ICD-10-CM | POA: Diagnosis not present

## 2019-04-10 DIAGNOSIS — R55 Syncope and collapse: Secondary | ICD-10-CM | POA: Diagnosis not present

## 2019-04-13 DIAGNOSIS — M549 Dorsalgia, unspecified: Secondary | ICD-10-CM | POA: Diagnosis not present

## 2019-04-13 DIAGNOSIS — R55 Syncope and collapse: Secondary | ICD-10-CM | POA: Diagnosis not present

## 2019-04-13 DIAGNOSIS — M810 Age-related osteoporosis without current pathological fracture: Secondary | ICD-10-CM | POA: Diagnosis not present

## 2019-04-13 DIAGNOSIS — G8929 Other chronic pain: Secondary | ICD-10-CM | POA: Diagnosis not present

## 2019-04-14 DIAGNOSIS — L97829 Non-pressure chronic ulcer of other part of left lower leg with unspecified severity: Secondary | ICD-10-CM | POA: Diagnosis not present

## 2019-04-21 DIAGNOSIS — Z792 Long term (current) use of antibiotics: Secondary | ICD-10-CM | POA: Diagnosis not present

## 2019-04-21 DIAGNOSIS — R55 Syncope and collapse: Secondary | ICD-10-CM | POA: Diagnosis not present

## 2019-04-21 DIAGNOSIS — L97829 Non-pressure chronic ulcer of other part of left lower leg with unspecified severity: Secondary | ICD-10-CM | POA: Diagnosis not present

## 2019-04-21 DIAGNOSIS — S0011XD Contusion of right eyelid and periocular area, subsequent encounter: Secondary | ICD-10-CM | POA: Diagnosis not present

## 2019-04-21 DIAGNOSIS — T8454XD Infection and inflammatory reaction due to internal left knee prosthesis, subsequent encounter: Secondary | ICD-10-CM | POA: Diagnosis not present

## 2019-04-24 DIAGNOSIS — T8454XD Infection and inflammatory reaction due to internal left knee prosthesis, subsequent encounter: Secondary | ICD-10-CM | POA: Diagnosis not present

## 2019-04-24 DIAGNOSIS — B9562 Methicillin resistant Staphylococcus aureus infection as the cause of diseases classified elsewhere: Secondary | ICD-10-CM | POA: Diagnosis not present

## 2019-04-24 DIAGNOSIS — S0011XD Contusion of right eyelid and periocular area, subsequent encounter: Secondary | ICD-10-CM | POA: Diagnosis not present

## 2019-04-24 DIAGNOSIS — R55 Syncope and collapse: Secondary | ICD-10-CM | POA: Diagnosis not present

## 2019-04-27 ENCOUNTER — Ambulatory Visit: Payer: Medicare Other | Admitting: Physician Assistant

## 2019-04-27 DIAGNOSIS — J439 Emphysema, unspecified: Secondary | ICD-10-CM | POA: Diagnosis not present

## 2019-04-27 DIAGNOSIS — N183 Chronic kidney disease, stage 3 unspecified: Secondary | ICD-10-CM | POA: Diagnosis not present

## 2019-04-27 DIAGNOSIS — R270 Ataxia, unspecified: Secondary | ICD-10-CM | POA: Diagnosis not present

## 2019-04-27 DIAGNOSIS — T8454XD Infection and inflammatory reaction due to internal left knee prosthesis, subsequent encounter: Secondary | ICD-10-CM | POA: Diagnosis not present

## 2019-04-27 DIAGNOSIS — M549 Dorsalgia, unspecified: Secondary | ICD-10-CM | POA: Diagnosis not present

## 2019-04-27 DIAGNOSIS — K5190 Ulcerative colitis, unspecified, without complications: Secondary | ICD-10-CM | POA: Diagnosis not present

## 2019-04-27 DIAGNOSIS — M810 Age-related osteoporosis without current pathological fracture: Secondary | ICD-10-CM | POA: Diagnosis not present

## 2019-04-27 DIAGNOSIS — Z792 Long term (current) use of antibiotics: Secondary | ICD-10-CM | POA: Diagnosis not present

## 2019-04-27 DIAGNOSIS — Z9181 History of falling: Secondary | ICD-10-CM | POA: Diagnosis not present

## 2019-04-27 DIAGNOSIS — Z87891 Personal history of nicotine dependence: Secondary | ICD-10-CM | POA: Diagnosis not present

## 2019-04-27 DIAGNOSIS — Z8673 Personal history of transient ischemic attack (TIA), and cerebral infarction without residual deficits: Secondary | ICD-10-CM | POA: Diagnosis not present

## 2019-04-27 DIAGNOSIS — Z79891 Long term (current) use of opiate analgesic: Secondary | ICD-10-CM | POA: Diagnosis not present

## 2019-04-27 DIAGNOSIS — D631 Anemia in chronic kidney disease: Secondary | ICD-10-CM | POA: Diagnosis not present

## 2019-04-27 DIAGNOSIS — I1310 Hypertensive heart and chronic kidney disease without heart failure, with stage 1 through stage 4 chronic kidney disease, or unspecified chronic kidney disease: Secondary | ICD-10-CM | POA: Diagnosis not present

## 2019-04-27 DIAGNOSIS — G8929 Other chronic pain: Secondary | ICD-10-CM | POA: Diagnosis not present

## 2019-04-27 DIAGNOSIS — I420 Dilated cardiomyopathy: Secondary | ICD-10-CM | POA: Diagnosis not present

## 2019-04-27 DIAGNOSIS — E785 Hyperlipidemia, unspecified: Secondary | ICD-10-CM | POA: Diagnosis not present

## 2019-04-27 DIAGNOSIS — B9562 Methicillin resistant Staphylococcus aureus infection as the cause of diseases classified elsewhere: Secondary | ICD-10-CM | POA: Diagnosis not present

## 2019-04-27 DIAGNOSIS — F0390 Unspecified dementia without behavioral disturbance: Secondary | ICD-10-CM | POA: Diagnosis not present

## 2019-04-27 DIAGNOSIS — Z7982 Long term (current) use of aspirin: Secondary | ICD-10-CM | POA: Diagnosis not present

## 2019-04-27 DIAGNOSIS — I739 Peripheral vascular disease, unspecified: Secondary | ICD-10-CM | POA: Diagnosis not present

## 2019-04-27 DIAGNOSIS — Z96643 Presence of artificial hip joint, bilateral: Secondary | ICD-10-CM | POA: Diagnosis not present

## 2019-04-27 DIAGNOSIS — I2510 Atherosclerotic heart disease of native coronary artery without angina pectoris: Secondary | ICD-10-CM | POA: Diagnosis not present

## 2019-04-27 DIAGNOSIS — M1990 Unspecified osteoarthritis, unspecified site: Secondary | ICD-10-CM | POA: Diagnosis not present

## 2019-04-27 DIAGNOSIS — Z955 Presence of coronary angioplasty implant and graft: Secondary | ICD-10-CM | POA: Diagnosis not present

## 2019-04-28 DIAGNOSIS — B9562 Methicillin resistant Staphylococcus aureus infection as the cause of diseases classified elsewhere: Secondary | ICD-10-CM | POA: Diagnosis not present

## 2019-04-28 DIAGNOSIS — R270 Ataxia, unspecified: Secondary | ICD-10-CM | POA: Diagnosis not present

## 2019-04-28 DIAGNOSIS — G8929 Other chronic pain: Secondary | ICD-10-CM | POA: Diagnosis not present

## 2019-04-28 DIAGNOSIS — M549 Dorsalgia, unspecified: Secondary | ICD-10-CM | POA: Diagnosis not present

## 2019-04-28 DIAGNOSIS — T8454XD Infection and inflammatory reaction due to internal left knee prosthesis, subsequent encounter: Secondary | ICD-10-CM | POA: Diagnosis not present

## 2019-04-28 DIAGNOSIS — Z9181 History of falling: Secondary | ICD-10-CM | POA: Diagnosis not present

## 2019-04-29 DIAGNOSIS — G8929 Other chronic pain: Secondary | ICD-10-CM | POA: Diagnosis not present

## 2019-04-29 DIAGNOSIS — T8454XD Infection and inflammatory reaction due to internal left knee prosthesis, subsequent encounter: Secondary | ICD-10-CM | POA: Diagnosis not present

## 2019-04-29 DIAGNOSIS — B9562 Methicillin resistant Staphylococcus aureus infection as the cause of diseases classified elsewhere: Secondary | ICD-10-CM | POA: Diagnosis not present

## 2019-04-29 DIAGNOSIS — R270 Ataxia, unspecified: Secondary | ICD-10-CM | POA: Diagnosis not present

## 2019-04-29 DIAGNOSIS — M549 Dorsalgia, unspecified: Secondary | ICD-10-CM | POA: Diagnosis not present

## 2019-04-29 DIAGNOSIS — Z9181 History of falling: Secondary | ICD-10-CM | POA: Diagnosis not present

## 2019-04-29 NOTE — Progress Notes (Signed)
[facility] [address] [place], [place] [address] [phone]  Date:  04/30/2019   Name:  [name]   DOB:  12/29/1940   MRN:  [id number]  PCP:  Darreld Mclean, MD    Chief Complaint: Hospitalization Follow-up and Flu Vaccine   History of Present Illness:  [name] is a 78 y.o. very pleasant female patient who presents with the following:  Here today for hospital follow-up visit Last seen by myself in June of this year.  She has history of CAD status post stents x3.  In 2017 she had a left total knee, complicated by MRSA infection.  She underwent several follow-up procedures and had an episode of sepsis due to obstructive kidney stone  Recently admitted for 1 week with a syncopal event, etiology of syncope is not certain-she was also found to have an acute kidney injury, also her chronic left knee infection with MRSA, hypertension, hyperlipidemia, CAD, UTI UTI was treated with IV antibiotics inpatient, no further antibiotic treatment needed She was discharged from rehab a week ago and is now back at home  Admitted from: Home Discharged to: SNF Admit date: 04/01/2019 Discharge date: 04/08/2019  Recommendations for Outpatient Follow-up:        Contact information for follow-up providers        Schedule an appointment as soon as possible for a visit with Qianna Clagett, Gay Filler, MD.   Specialty: Family Medicine Why: Upon discharge from SNF. Contact information: [place] [address] [phone]             MD at SNF. Schedule an appointment as soon as possible for a visit today.   Why: To be seen in 2 to 3 days with repeat labs (CBC & CMP).            Michel Bickers, MD. Schedule an appointment as soon as possible for a visit.   Specialty: Infectious Diseases Why: Patient reportedly had an appointment for today which she missed.  Dr. Megan Salon was  contacted yesterday and assured that his office will arrange follow-up.  Please coordinate. Contact information: [address] [place] [address] [phone]         Mcarthur Rossetti, MD. Schedule an appointment as soon as possible for a visit.   Specialty: Orthopedic Surgery Contact information: [place] [address] [phone]                 Contact information for after-discharge care            [facility] .   Service: Skilled Nursing Contact information: [address] [place] [phone]                   Please arrange outpatient Vascular Surgery consultation for evaluation and follow-up of carotid stenosis noted on carotid duplex. Recommend repeating urine microscopy in a couple of weeks to reassess for microscopic hematuria and if persists consider urology consultation.  Discharge Diagnoses:  Principal Problem:   Syncope Active Problems:   Hyperlipidemia   Essential hypertension   Chronic infection of prosthetic knee (HCC)   Chronic diarrhea Brief Summary: 78 year old female, lives alone, ambulates with the help of a walker, PMH of CAD s/p stents, HTN, HLD, chronic left knee MRSA infection on  suppressive antibiotic therapy, carotid artery disease, PAD, chronic intermittent diarrhea, reportedly wears depends at home, presented to [facility] ED on 04/01/2019 due to some worsening of her chronic diarrhea followed by syncope, fall and right black eye. Assessment and plan:  1. Syncope: Unclear etiology but suspect related to worsening diarrhea, related dehydration and may have had orthostatic changes at home in the context of ongoing antihypertensives.  No prodrome and patient has no recollection of event.  Had some right periorbital ecchymosis.  CT head and neck without acute abnormalities.  Telemetry showed sinus  rhythm without arrhythmias or pauses.  EKG unremarkable.  Orthostatic vitals were negative although they were taken after IV fluids.  TTE shows normal EF and rest of results as noted below.  Less likely related to Proteus mirabilis/E. coli UTI which has now been treated.  Although patient had carotid Dopplers in November 2019 that showed bilateral 1-39% stenosis, given her presentation with syncope and chest x-ray reported as "dense bilateral carotid artery atheromatous calcifications", repeated carotid duplex which shows bilateral 1-39% ICA stenosis, upper end of range and right ECA appears >50% stenosed, right subclavian artery was stenotic.  May consider outpatient vascular surgery consultation and follow-up.  May also consider outpatient cardiology evaluation of mild to moderate aortic stenosis although both these are felt less likely by themselves as cause of her syncope.  Patient has been instructed not to drive for 6 months.  I reviewed the carotid Doppler results with the Vascular Surgeon on-call who indicates that she has mild ICA stenosis, vertebral artery flow is antegrade and no concern for posterior circulation compromise and no intervention recommended at this time. 2. Acute kidney injury: Baseline creatinine 0.87 in April.  Presented with creatinine of 1.8.  Mildly elevated CK.  Likely related to prerenal azotemia from increased GI losses.  Resolved after IV fluids. 3. Acute on chronic diarrhea: Reports longstanding diarrhea with BMs immediately after waking up and after each meal.  Takes mesalamine.  Unclear as to her underlying diagnosis and unable to say if she sees GI.  Diarrhea reportedly worse somewhat prior to admission.  Low index of suspicion for C. difficile.  Patient clearly states that she has not had diarrhea since hospitalization.  Continue prior home dose of mesalamine and probiotics and consider outpatient GI consultation if not undertaken already. 4. Chronic prosthetic left knee  MRSA infection: Admitting hospitalist discussed with Dr. Megan Salon who will arrange follow-up in his office.  Patient missed an appointment that she had with him today.  Continue dual antibiotics i.e. doxycycline and rifampin for suppressive therapy.  Patient reportedly also follows with Dr. Ninfa Linden, orthopedics.  No acute infection or worsening.  Patient appears to be on chronic tramadol PTA.  [place] controlled substance database and had failed 14 tablets of tramadol on 8/24 with 1 refill.  Since she is going to SNF, provided with short course prescription until SNF MD follow-up. 5. Essential hypertension: Lisinopril was temporarily held due to acute kidney injury and resumed at discharge.  Due to uncontrolled BP, carvedilol was increased from 12.5 twice daily to 25 twice daily and hydralazine was added to amlodipine.  Also her Lasix dose has been reduced.  Unclear as to why she is on Lasix, defer to PCP follow-up as outpatient.  Monitor closely. 6. Hyperlipidemia: Continue statins. 7. Proteus mirabilis and E. coli UTI: Completed 1 week course of IV antibiotics.  Asymptomatic of dysuria.  Completed course of antibiotics for this indication.  I do not believe that  she met sepsis criteria on admission.  Urine microscopy had shown some microscopic hematuria and recommend repeating urine microscopy in a couple of weeks and if this persists then recommend outpatient urology consultation. 8. CAD status post stents: No chest pains.  Continue aspirin, statins and beta-blockers. 9. Thrombocytopenia: Unclear etiology.  Resolved.   Consultations:  None  Procedures:  Carotid Dopplers 04/08/2019: Summary: Right Carotid: Velocities in the right ICA are consistent with a 1-39% stenosis. Upper end of range. The ECA appears >50% stenosed. Left Carotid: Velocities in the left ICA are consistent with a 1-39% stenosis. Upper end of range. Subclavians: Right subclavian  artery was stenotic. Normal flow hemodynamics were seen in the left subclavian artery.  TTE 04/03/2019: IMPRESSIONS   1. The left ventricle has normal systolic function with an ejection fraction of 60-65%. The cavity size was normal. Left ventricular diastolic Doppler parameters are consistent with impaired relaxation. Indeterminate filling pressures The E/e' is 8-15.  No evidence of left ventricular regional wall motion abnormalities. 2. The right ventricle has normal systolic function. The cavity was normal. There is no increase in right ventricular wall thickness. 3. The mitral valve is abnormal. Mild thickening of the mitral valve leaflet. There is moderate mitral annular calcification present. 4. The tricuspid valve is grossly normal. Tricuspid valve regurgitation is mild-moderate. 5. The aortic valve is tricuspid. Moderate sclerosis of the aortic valve. Aortic valve regurgitation is mild to moderate by color flow Doppler. Mild-moderate stenosis of the aortic valve. 6. The aorta is normal unless otherwise noted. 7. The inferior vena cava was dilated in size with >50% respiratory variability.  SUMMARY LVEF 60-65%, normal wall thickness, normal wall motion, grade 1 DD, indeterminate LV filling pressure, normal RV function, calcified aortic valve with mild to moderate stenosis - mean gradient 14 mmHG, AVA 1.5-1.6 cm2, mild to moderate AI, trace to mild MR with MAC, normal biatrial size, mild to moderate TR, RVSP 42 mmHg, dilated IVC  She is still living alone. She does have a life alert button that she used to call for help prior to her hospital stay She had a fall a couple of hours after she was left alone upon getting home from the SNF She used her button and her daughter was able to come back and help her up- she did not seem to get hurt this time thankfully  Overall Jyoti feels like she is getting back to her baseline appetitie is ok  Energy level is ok Her  renal function was back to normal by the time of hospital discharge   They wonder about a custom knee brace for her leg- ?might help with stability.  She has a chronic lateral patellar dislocation, surgical repair is not really an option for her.  A custom knee brace as might be available through [employer] could be useful  We will repeat urine today, no blood work necessary.  She has no persistent UTI symptoms at this time  Repeat UA today shows no evidence of microhematuria-previous microhematuria can be explained by UTI  Results for orders placed or performed in visit on 04/30/19  POCT urinalysis dipstick  Result Value Ref Range   Color, UA yellow yellow   Clarity, UA clear clear   Glucose, UA negative negative mg/dL   Bilirubin, UA negative negative   Ketones, POC UA negative negative mg/dL   Spec Grav, UA 1.010 1.010 - 1.025   Blood, UA negative negative   pH, UA 7.5 5.0 - 8.0   Protein Ur,  POC negative negative mg/dL   Urobilinogen, UA 0.2 0.2 or 1.0 E.U./dL   Nitrite, UA negative    Leukocytes, UA Negative Negative      Patient Active Problem List   Diagnosis Date Noted  . Syncope 04/01/2019  . Closed fracture of multiple pubic rami ([place]) 07/22/2018  . Closed fracture of right superior rim of pubis ([place])   . Lateral dislocation of left patella 01/15/2017  . Unilateral primary osteoarthritis, right knee 01/15/2017  . Osteoporosis 11/26/2016  . Acute pain of left knee 08/27/2016  . Chronic diarrhea 08/07/2016  . Left displaced femoral neck fracture ([place]) 06/26/2016  . MRSA (methicillin resistant Staphylococcus aureus) infection 06/26/2016  . Hypertensive heart disease   . Hydronephrosis   . Obstructive uropathy   . Abnormal echocardiogram 01/23/2016  . Pressure ulcer 01/21/2016  . Chronic infection of prosthetic knee ([place]) 10/12/2015  . AAA 01/19/2009  . ILIAC ARTERY ANEURYSM 01/19/2009  . Hyperlipidemia 01/14/2009  . TOBACCO ABUSE 01/14/2009  . Essential  hypertension 01/14/2009  . Coronary atherosclerosis 01/14/2009  . Cerebrovascular disease 01/14/2009  . Peripheral vascular disease ([place]) 01/14/2009  . CHRONIC OBSTRUCTIVE PULMONARY DISEASE 01/14/2009    Past Medical History:  Diagnosis Date  . Arthritis   . CAD (coronary artery disease)    a. 02/2007 s/p PCI/DES ot LCX and RCA;  b. 07/2014 MV: no ischemia/infarct, EF 68%.  . Carotid arterial disease ([place])    a. 05/2015 Carotid U/S: RICA 4-16%, LICA 38-45%, RECA 3-64%, LECA >50% - overall stable, f/u 1 yr.  . Complication of anesthesia    Three days after procedure pt statrted to suffer with memoy loss that took about 4-5 days to come back according to [name]."  . Dilated cardiomyopathy ([place])    a. 05/2005 Echo: EF 65%; b. 01/22/2016 Echo: EF 25-30% (in setting of admission for urosepsis w/ elev trop); c. 01/26/2016 Echo: EF 35-40%, inf, distal apical, apical, and mid to distal septal HK-->felt to be 2/2 stress induced CM.  [name] Heart murmur   . History of bronchitis   . Hyperlipidemia   . Hypertension   . Hypertensive heart disease   . Memory impairment   . MRSA (methicillin resistant staph aureus) culture positive   . Nephrolithiasis    a. 12/2015 UVJ stone w/ hydroureteronephrosis req nephrostomy tube placement.  . Osteoarthritis    a. 12/8030 s/p L TKA complicated by infections req skin grafting.  [name] PVD (peripheral vascular disease) ([place])    a. Bilat common iliac dzs, nl ABI's --> med rx.  . Sepsis ([place])    a. 12/2015 admitted with Proteus mirabilis urosepsis/bacteremia.  . Wears glasses     Past Surgical History:  Procedure Laterality Date  . ABDOMINAL HYSTERECTOMY    . APPLICATION OF A-CELL OF EXTREMITY Left 12/08/2015   Procedure: APPLICATION OF A-CELL OF knee;  Surgeon: Loel Lofty [name], DO;  Location: [facility];  Service: Plastics;  Laterality: Left;  . APPLICATION OF WOUND VAC Left 11/16/2015   Procedure: APPLICATION OF WOUND VAC;  Surgeon: Loel Lofty [name], DO;   Location: [facility];  Service: Plastics;  Laterality: Left;  . APPLICATION OF WOUND VAC Left 12/08/2015   Procedure: APPLICATION OF WOUND VAC  AND CAST to knee;  Surgeon: Loel Lofty [name], DO;  Location: [facility];  Service: Plastics;  Laterality: Left;  . COLONOSCOPY    . CORONARY ANGIOPLASTY  07   3 stents placed  . ESOPHAGOGASTRODUODENOSCOPY    . EYE SURGERY Bilateral  cataracts  . FREE FLAP TO EXTREMITY Left 11/16/2015   Procedure: FREE FLAP TO EXTREMITY;  Surgeon: Loel Lofty [name], DO;  Location: [facility];  Service: Plastics;  Laterality: Left;  . I&D EXTREMITY Left 10/18/2015   Procedure: Left Knee Washout, Reclosure;  Surgeon: Newt Minion, MD;  Location: [facility];  Service: Orthopedics;  Laterality: Left;  . I&D EXTREMITY Left 12/08/2015   Procedure: IRRIGATION AND DEBRIDEMENT knee;  Surgeon: Loel Lofty [name], DO;  Location: [facility];  Service: Plastics;  Laterality: Left;  . I&D KNEE WITH POLY EXCHANGE Left 10/12/2015   Procedure: Irrigation and Debridement , Poly Exchange, Antibiotic Beads Left Knee;  Surgeon: Newt Minion, MD;  Location: [facility];  Service: Orthopedics;  Laterality: Left;  . INCISION AND DRAINAGE OF WOUND Left 01/04/2016   Procedure: DEBRIDEMENT OF LEFT KNEE WOUND WITH ACELL AND WOUND VAC PLACEMENT;  Surgeon: Loel Lofty [name], DO;  Location: [facility];  Service: Plastics;  Laterality: Left;  . IR GENERIC HISTORICAL  05/11/2016   IR FLUORO GUIDE CV MIDLINE PICC RIGHT 05/11/2016 Sandi Mariscal, MD [facility]INTER[facility]  . IR GENERIC HISTORICAL  05/11/2016   IR US GUIDE VASC ACCESS RIGHT 05/11/2016 Sandi Mariscal, MD [facility]  . KNEE CLOSED REDUCTION Left 01/16/2017   Procedure: CLOSED MANIPULATION LEFT KNEE;  Surgeon: Leandrew Koyanagi, MD;  Location: [facility];  Service: Orthopedics;  Laterality: Left;  . KNEE SURGERY     denies  . SKIN SPLIT GRAFT Left 11/16/2015   Procedure: LEFT GASTROC MUSCLE FLAP WITH SKIN GRAFT SPLIT THICKNESS AND VAC PLACEMENT;  Surgeon: Loel Lofty  [name], DO;  Location: [facility];  Service: Plastics;  Laterality: Left;  . TOTAL HIP ARTHROPLASTY Right 09/10/2012   Dr Sharol Given  . TOTAL HIP ARTHROPLASTY Right 09/10/2012   Procedure: TOTAL HIP ARTHROPLASTY;  Surgeon: Newt Minion, MD;  Location: [facility];  Service: Orthopedics;  Laterality: Right;  Right Total Hip Arthroplasty  . TOTAL HIP ARTHROPLASTY Left 06/27/2016   Procedure: LEFT TOTAL HIP ARTHROPLASTY ANTERIOR APPROACH;  Surgeon: Leandrew Koyanagi, MD;  Location: [facility];  Service: Orthopedics;  Laterality: Left;  . TOTAL KNEE ARTHROPLASTY Left 09/28/2015   Procedure: TOTAL KNEE ARTHROPLASTY;  Surgeon: Newt Minion, MD;  Location: [facility];  Service: Orthopedics;  Laterality: Left;    Social History   Tobacco Use  . Smoking status: Former Smoker    Packs/day: 0.50    Years: 50.00    Pack years: 25.00    Types: Cigarettes    Quit date: 09/28/2015    Years since quitting: 3.5  . Smokeless tobacco: Never Used  . Tobacco comment: quit 09/27/15  Substance Use Topics  . Alcohol use: No  . Drug use: No    Family History  Problem Relation Age of Onset  . Breast cancer Mother   . Leukemia Father   . Stroke Sister   . Bone cancer Other     No Known Allergies  Medication list has been reviewed and updated.  Current Outpatient Medications on File Prior to Visit  Medication Sig Dispense Refill  . amLODipine (NORVASC) 10 MG tablet Take 1 tablet (10 mg total) by mouth daily.    [name] aspirin EC 81 MG tablet Take 81 mg by mouth daily.    . Calcium Carbonate-Vit D-Min (CALCIUM 1200 PO) Take 1 tablet by mouth daily.     . diclofenac sodium (VOLTAREN) 1 % GEL Apply 2 g topically 4 (four) times daily. Use as  needed for joint pain.  Max 32g/day total 100 g 3  . doxycycline (VIBRA-TABS) 100 MG tablet TAKE ONE TABLET BY MOUTH TWICE A DAY (Patient taking differently: Take 100 mg by mouth 2 (two) times daily. ) 60 tablet 10  . furosemide (LASIX) 20 MG tablet Take 1 tablet (20 mg total) by mouth every other  day. 15 tablet 0  . hydrALAZINE (APRESOLINE) 25 MG tablet Take 1 tablet (25 mg total) by mouth every 8 (eight) hours. 90 tablet 0  . Magnesium Oxide (MAG-OXIDE) 200 MG TABS Take 200 mg by mouth every morning.     . Multiple Vitamin (MULITIVITAMIN WITH MINERALS) TABS Take 1 tablet by mouth daily.    . potassium chloride SA (K-DUR) 20 MEQ tablet Take 1 tablet (20 mEq total) by mouth every other day. With lasix. 90 tablet 2  . Probiotic Product (PROBIOTIC DAILY PO) Take 1 capsule by mouth daily.    . rifampin (RIFADIN) 300 MG capsule Take 300 mg by mouth 2 (two) times daily.    . traMADol (ULTRAM) 50 MG tablet Take 1 tablet (50 mg total) by mouth daily. 5 tablet 0  . mesalamine (LIALDA) 1.2 g EC tablet Take 2.4 g by mouth daily with breakfast.     No current facility-administered medications on file prior to visit.     Review of Systems:  As per HPI- otherwise negative.  No fever or chills, no chest pain or shortness of breath Physical Examination: Vitals:   04/30/19 1303  BP: 112/82  Pulse: 75  Resp: 16  Temp: 97.7 F (36.5 C)  SpO2: 98%   Vitals:   04/30/19 1303  Weight: 115 lb (52.2 kg)  Height: _0  (1.6 m)   Body mass index is 20.37 kg/m. Ideal Body Weight: Weight in (lb) to have BMI = 25: 140.8  GEN: WDWN, NAD, Non-toxic, A & O x 3, elderly woman with frail appearance.  Does not appear acutely ill Accompanied today by her daughter HEENT: Atraumatic, Normocephalic. Neck supple. No masses, No LAD.  TM within normal limits bilaterally Ears and Nose: No external deformity. CV: RRR, No M/G/R. No JVD. No thrill. No extra heart sounds. PULM: CTA B, no wheezes, crackles, rhonchi. No retractions. No resp. distress. No accessory muscle use. ABD: S, NT, ND EXTR: No c/c/e NEURO Normal gait for patient.  She has poor use of her left knee due to chronic patellar dislocation.  She uses a walker.  Her left patella is chronically dislocated in a lateral direction PSYCH: Normally  interactive. Conversant. Not depressed or anxious appearing.  Calm demeanor.    Assessment and Plan: Hospital discharge follow-up  Essential hypertension - Plan: carvedilol (COREG) 25 MG tablet, lisinopril (ZESTRIL) 40 MG tablet  Cerebrovascular disease - Plan: atorvastatin (LIPITOR) 40 MG tablet  Atherosclerosis of native coronary artery of native heart without angina pectoris - Plan: atorvastatin (LIPITOR) 40 MG tablet  Acute cystitis without hematuria - Plan: Urine Culture, POCT urinalysis dipstick, CANCELED: Basic metabolic panel  Carotid stenosis, asymptomatic, bilateral  MRSA (methicillin resistant Staphylococcus aureus) infection  Chronic infection of knee joint prosthesis, subsequent encounter  Following up from hospital discharge today. She was given some paper prescriptions for rehab, we went to her medications and did refills as indicated. We are not entirely clear if she is taking furosemide/potassium daily, every other day, or not at all.  Her daughter will check the pillbox when they get home and let me know.    Denisse's blood pressure is  a bit soft.  She also does complain of mild edema of her feet.  We will decrease her amlodipine from 10 to 5 mg.  I will also touch base with her cardiologist and get his input  She also does have mild bilateral carotid stenosis, I do not believe this has worsened over the past year.  Per my understanding her cardiologist Dr. [name] is already monitoring this, so referral to vascular surgery should not be necessary.  I will confirm this with Dr. [name]  Urine culture pending today to ensure UTI has cleared  She has chronic malfunction of her left knee due to MRSA infection of the knee hardware.  Her knee does not bend very well, she has a chronic dislocation of the patella.  They are interested in a custom brace, which might provide more stability.  I wrote a prescription for same and directed them to take it to [facility] on [address] than 40 minutes spent today in face-to-face care, more than 50% in counseling  Signed Lamar Blinks, MD

## 2019-04-30 ENCOUNTER — Encounter: Payer: Self-pay | Admitting: Family Medicine

## 2019-04-30 ENCOUNTER — Other Ambulatory Visit: Payer: Self-pay

## 2019-04-30 ENCOUNTER — Ambulatory Visit (INDEPENDENT_AMBULATORY_CARE_PROVIDER_SITE_OTHER): Payer: Medicare Other | Admitting: Family Medicine

## 2019-04-30 VITALS — BP 112/82 | HR 75 | Temp 97.7°F | Resp 16 | Ht 63.0 in | Wt 115.0 lb

## 2019-04-30 DIAGNOSIS — I679 Cerebrovascular disease, unspecified: Secondary | ICD-10-CM | POA: Diagnosis not present

## 2019-04-30 DIAGNOSIS — T8459XD Infection and inflammatory reaction due to other internal joint prosthesis, subsequent encounter: Secondary | ICD-10-CM | POA: Diagnosis not present

## 2019-04-30 DIAGNOSIS — A4902 Methicillin resistant Staphylococcus aureus infection, unspecified site: Secondary | ICD-10-CM | POA: Diagnosis not present

## 2019-04-30 DIAGNOSIS — I2510 Atherosclerotic heart disease of native coronary artery without angina pectoris: Secondary | ICD-10-CM

## 2019-04-30 DIAGNOSIS — Z96659 Presence of unspecified artificial knee joint: Secondary | ICD-10-CM

## 2019-04-30 DIAGNOSIS — I10 Essential (primary) hypertension: Secondary | ICD-10-CM

## 2019-04-30 DIAGNOSIS — I6523 Occlusion and stenosis of bilateral carotid arteries: Secondary | ICD-10-CM

## 2019-04-30 DIAGNOSIS — N3000 Acute cystitis without hematuria: Secondary | ICD-10-CM | POA: Diagnosis not present

## 2019-04-30 DIAGNOSIS — Z09 Encounter for follow-up examination after completed treatment for conditions other than malignant neoplasm: Secondary | ICD-10-CM

## 2019-04-30 LAB — POCT URINALYSIS DIP (MANUAL ENTRY)
Bilirubin, UA: NEGATIVE
Blood, UA: NEGATIVE
Glucose, UA: NEGATIVE mg/dL
Ketones, POC UA: NEGATIVE mg/dL
Leukocytes, UA: NEGATIVE
Nitrite, UA: NEGATIVE
Protein Ur, POC: NEGATIVE mg/dL
Spec Grav, UA: 1.01 (ref 1.010–1.025)
Urobilinogen, UA: 0.2 E.U./dL
pH, UA: 7.5 (ref 5.0–8.0)

## 2019-04-30 MED ORDER — CARVEDILOL 25 MG PO TABS: 25.0000 mg | ORAL_TABLET | Freq: Two times a day (BID) | ORAL | 3 refills | Status: DC

## 2019-04-30 MED ORDER — LISINOPRIL 40 MG PO TABS: 40.0000 mg | ORAL_TABLET | Freq: Every day | ORAL | 3 refills | Status: DC

## 2019-04-30 MED ORDER — ATORVASTATIN CALCIUM 40 MG PO TABS: 40.0000 mg | ORAL_TABLET | Freq: Every day | ORAL | 3 refills | Status: DC

## 2019-04-30 NOTE — Patient Instructions (Signed)
It was great to see you today, I am glad you are home from the hospital and rehab  I will be in touch with your urine culture ASAP  I will ask Dr. [name] about your carotid artery ultrasound.  I hope you will not need to see a vascular surgeon as well, but will ask his opinion.  Your blood pressure is borderline low.  I would suggest that we cut your current dose of amlodipine in half to 5 mg.  You can simply take 1/2 tablet of what you have on hand  Please double check your pillbox at home, and confirm if you are taking furosemide and/or potassium supplementation.  Please let me know what you find out  [facility], on [address], may be able to help you with a custom knee brace.  I will write a prescription for this today Address: [address] [place], [place], [place] [address] Phone: [phone]

## 2019-05-01 DIAGNOSIS — M549 Dorsalgia, unspecified: Secondary | ICD-10-CM | POA: Diagnosis not present

## 2019-05-01 DIAGNOSIS — R270 Ataxia, unspecified: Secondary | ICD-10-CM | POA: Diagnosis not present

## 2019-05-01 DIAGNOSIS — T8454XD Infection and inflammatory reaction due to internal left knee prosthesis, subsequent encounter: Secondary | ICD-10-CM | POA: Diagnosis not present

## 2019-05-01 DIAGNOSIS — B9562 Methicillin resistant Staphylococcus aureus infection as the cause of diseases classified elsewhere: Secondary | ICD-10-CM | POA: Diagnosis not present

## 2019-05-01 DIAGNOSIS — Z9181 History of falling: Secondary | ICD-10-CM | POA: Diagnosis not present

## 2019-05-01 DIAGNOSIS — G8929 Other chronic pain: Secondary | ICD-10-CM | POA: Diagnosis not present

## 2019-05-01 LAB — URINE CULTURE
MICRO NUMBER:: 944024
SPECIMEN QUALITY:: ADEQUATE

## 2019-05-02 ENCOUNTER — Encounter: Payer: Self-pay | Admitting: Family Medicine

## 2019-05-04 ENCOUNTER — Telehealth: Payer: Self-pay

## 2019-05-04 NOTE — Telephone Encounter (Signed)
Copied from [place] [id number]. Topic: Quick Communication - Home Health Verbal Orders >> May 04, 2019  8:55 AM Carolyn Stare wrote: York Pellant with [facility] Number [phone] Requesting verbal orders for OT   Frequency 2 X 1   1 X 4

## 2019-05-05 ENCOUNTER — Ambulatory Visit: Payer: Medicare Other | Admitting: Internal Medicine

## 2019-05-05 DIAGNOSIS — Z9181 History of falling: Secondary | ICD-10-CM | POA: Diagnosis not present

## 2019-05-05 DIAGNOSIS — G8929 Other chronic pain: Secondary | ICD-10-CM | POA: Diagnosis not present

## 2019-05-05 DIAGNOSIS — T8454XD Infection and inflammatory reaction due to internal left knee prosthesis, subsequent encounter: Secondary | ICD-10-CM | POA: Diagnosis not present

## 2019-05-05 DIAGNOSIS — B9562 Methicillin resistant Staphylococcus aureus infection as the cause of diseases classified elsewhere: Secondary | ICD-10-CM | POA: Diagnosis not present

## 2019-05-05 DIAGNOSIS — M549 Dorsalgia, unspecified: Secondary | ICD-10-CM | POA: Diagnosis not present

## 2019-05-05 DIAGNOSIS — R270 Ataxia, unspecified: Secondary | ICD-10-CM | POA: Diagnosis not present

## 2019-05-05 NOTE — Telephone Encounter (Signed)
Verbal orders given  

## 2019-05-06 DIAGNOSIS — G8929 Other chronic pain: Secondary | ICD-10-CM | POA: Diagnosis not present

## 2019-05-06 DIAGNOSIS — B9562 Methicillin resistant Staphylococcus aureus infection as the cause of diseases classified elsewhere: Secondary | ICD-10-CM | POA: Diagnosis not present

## 2019-05-06 DIAGNOSIS — R270 Ataxia, unspecified: Secondary | ICD-10-CM | POA: Diagnosis not present

## 2019-05-06 DIAGNOSIS — Z9181 History of falling: Secondary | ICD-10-CM | POA: Diagnosis not present

## 2019-05-06 DIAGNOSIS — M549 Dorsalgia, unspecified: Secondary | ICD-10-CM | POA: Diagnosis not present

## 2019-05-06 DIAGNOSIS — T8454XD Infection and inflammatory reaction due to internal left knee prosthesis, subsequent encounter: Secondary | ICD-10-CM | POA: Diagnosis not present

## 2019-05-08 DIAGNOSIS — G8929 Other chronic pain: Secondary | ICD-10-CM | POA: Diagnosis not present

## 2019-05-08 DIAGNOSIS — R270 Ataxia, unspecified: Secondary | ICD-10-CM | POA: Diagnosis not present

## 2019-05-08 DIAGNOSIS — M549 Dorsalgia, unspecified: Secondary | ICD-10-CM | POA: Diagnosis not present

## 2019-05-08 DIAGNOSIS — B9562 Methicillin resistant Staphylococcus aureus infection as the cause of diseases classified elsewhere: Secondary | ICD-10-CM | POA: Diagnosis not present

## 2019-05-08 DIAGNOSIS — T8454XD Infection and inflammatory reaction due to internal left knee prosthesis, subsequent encounter: Secondary | ICD-10-CM | POA: Diagnosis not present

## 2019-05-08 DIAGNOSIS — Z9181 History of falling: Secondary | ICD-10-CM | POA: Diagnosis not present

## 2019-05-11 DIAGNOSIS — R270 Ataxia, unspecified: Secondary | ICD-10-CM | POA: Diagnosis not present

## 2019-05-11 DIAGNOSIS — T8454XD Infection and inflammatory reaction due to internal left knee prosthesis, subsequent encounter: Secondary | ICD-10-CM | POA: Diagnosis not present

## 2019-05-11 DIAGNOSIS — G8929 Other chronic pain: Secondary | ICD-10-CM | POA: Diagnosis not present

## 2019-05-11 DIAGNOSIS — M549 Dorsalgia, unspecified: Secondary | ICD-10-CM | POA: Diagnosis not present

## 2019-05-11 DIAGNOSIS — B9562 Methicillin resistant Staphylococcus aureus infection as the cause of diseases classified elsewhere: Secondary | ICD-10-CM | POA: Diagnosis not present

## 2019-05-11 DIAGNOSIS — Z9181 History of falling: Secondary | ICD-10-CM | POA: Diagnosis not present

## 2019-05-13 DIAGNOSIS — G8929 Other chronic pain: Secondary | ICD-10-CM | POA: Diagnosis not present

## 2019-05-13 DIAGNOSIS — Z9181 History of falling: Secondary | ICD-10-CM | POA: Diagnosis not present

## 2019-05-13 DIAGNOSIS — T8454XD Infection and inflammatory reaction due to internal left knee prosthesis, subsequent encounter: Secondary | ICD-10-CM | POA: Diagnosis not present

## 2019-05-13 DIAGNOSIS — R270 Ataxia, unspecified: Secondary | ICD-10-CM | POA: Diagnosis not present

## 2019-05-13 DIAGNOSIS — M549 Dorsalgia, unspecified: Secondary | ICD-10-CM | POA: Diagnosis not present

## 2019-05-13 DIAGNOSIS — B9562 Methicillin resistant Staphylococcus aureus infection as the cause of diseases classified elsewhere: Secondary | ICD-10-CM | POA: Diagnosis not present

## 2019-05-18 DIAGNOSIS — B9562 Methicillin resistant Staphylococcus aureus infection as the cause of diseases classified elsewhere: Secondary | ICD-10-CM | POA: Diagnosis not present

## 2019-05-18 DIAGNOSIS — Z9181 History of falling: Secondary | ICD-10-CM | POA: Diagnosis not present

## 2019-05-18 DIAGNOSIS — M549 Dorsalgia, unspecified: Secondary | ICD-10-CM | POA: Diagnosis not present

## 2019-05-18 DIAGNOSIS — T8454XD Infection and inflammatory reaction due to internal left knee prosthesis, subsequent encounter: Secondary | ICD-10-CM | POA: Diagnosis not present

## 2019-05-18 DIAGNOSIS — G8929 Other chronic pain: Secondary | ICD-10-CM | POA: Diagnosis not present

## 2019-05-18 DIAGNOSIS — R270 Ataxia, unspecified: Secondary | ICD-10-CM | POA: Diagnosis not present

## 2019-05-25 DIAGNOSIS — G8929 Other chronic pain: Secondary | ICD-10-CM | POA: Diagnosis not present

## 2019-05-25 DIAGNOSIS — Z9181 History of falling: Secondary | ICD-10-CM | POA: Diagnosis not present

## 2019-05-25 DIAGNOSIS — R270 Ataxia, unspecified: Secondary | ICD-10-CM | POA: Diagnosis not present

## 2019-05-25 DIAGNOSIS — T8454XD Infection and inflammatory reaction due to internal left knee prosthesis, subsequent encounter: Secondary | ICD-10-CM | POA: Diagnosis not present

## 2019-05-25 DIAGNOSIS — B9562 Methicillin resistant Staphylococcus aureus infection as the cause of diseases classified elsewhere: Secondary | ICD-10-CM | POA: Diagnosis not present

## 2019-05-25 DIAGNOSIS — M549 Dorsalgia, unspecified: Secondary | ICD-10-CM | POA: Diagnosis not present

## 2019-05-27 DIAGNOSIS — M549 Dorsalgia, unspecified: Secondary | ICD-10-CM | POA: Diagnosis not present

## 2019-05-27 DIAGNOSIS — M1990 Unspecified osteoarthritis, unspecified site: Secondary | ICD-10-CM | POA: Diagnosis not present

## 2019-05-27 DIAGNOSIS — Z9181 History of falling: Secondary | ICD-10-CM | POA: Diagnosis not present

## 2019-05-27 DIAGNOSIS — D631 Anemia in chronic kidney disease: Secondary | ICD-10-CM | POA: Diagnosis not present

## 2019-05-27 DIAGNOSIS — I420 Dilated cardiomyopathy: Secondary | ICD-10-CM | POA: Diagnosis not present

## 2019-05-27 DIAGNOSIS — K5190 Ulcerative colitis, unspecified, without complications: Secondary | ICD-10-CM | POA: Diagnosis not present

## 2019-05-27 DIAGNOSIS — Z792 Long term (current) use of antibiotics: Secondary | ICD-10-CM | POA: Diagnosis not present

## 2019-05-27 DIAGNOSIS — F0390 Unspecified dementia without behavioral disturbance: Secondary | ICD-10-CM | POA: Diagnosis not present

## 2019-05-27 DIAGNOSIS — Z79891 Long term (current) use of opiate analgesic: Secondary | ICD-10-CM | POA: Diagnosis not present

## 2019-05-27 DIAGNOSIS — I739 Peripheral vascular disease, unspecified: Secondary | ICD-10-CM | POA: Diagnosis not present

## 2019-05-27 DIAGNOSIS — I1310 Hypertensive heart and chronic kidney disease without heart failure, with stage 1 through stage 4 chronic kidney disease, or unspecified chronic kidney disease: Secondary | ICD-10-CM | POA: Diagnosis not present

## 2019-05-27 DIAGNOSIS — Z96643 Presence of artificial hip joint, bilateral: Secondary | ICD-10-CM | POA: Diagnosis not present

## 2019-05-27 DIAGNOSIS — J439 Emphysema, unspecified: Secondary | ICD-10-CM | POA: Diagnosis not present

## 2019-05-27 DIAGNOSIS — E785 Hyperlipidemia, unspecified: Secondary | ICD-10-CM | POA: Diagnosis not present

## 2019-05-27 DIAGNOSIS — Z87891 Personal history of nicotine dependence: Secondary | ICD-10-CM | POA: Diagnosis not present

## 2019-05-27 DIAGNOSIS — Z7982 Long term (current) use of aspirin: Secondary | ICD-10-CM | POA: Diagnosis not present

## 2019-05-27 DIAGNOSIS — B9562 Methicillin resistant Staphylococcus aureus infection as the cause of diseases classified elsewhere: Secondary | ICD-10-CM | POA: Diagnosis not present

## 2019-05-27 DIAGNOSIS — N183 Chronic kidney disease, stage 3 unspecified: Secondary | ICD-10-CM | POA: Diagnosis not present

## 2019-05-27 DIAGNOSIS — G8929 Other chronic pain: Secondary | ICD-10-CM | POA: Diagnosis not present

## 2019-05-27 DIAGNOSIS — M810 Age-related osteoporosis without current pathological fracture: Secondary | ICD-10-CM | POA: Diagnosis not present

## 2019-05-27 DIAGNOSIS — Z955 Presence of coronary angioplasty implant and graft: Secondary | ICD-10-CM | POA: Diagnosis not present

## 2019-05-27 DIAGNOSIS — Z8673 Personal history of transient ischemic attack (TIA), and cerebral infarction without residual deficits: Secondary | ICD-10-CM | POA: Diagnosis not present

## 2019-05-27 DIAGNOSIS — I2510 Atherosclerotic heart disease of native coronary artery without angina pectoris: Secondary | ICD-10-CM | POA: Diagnosis not present

## 2019-05-27 DIAGNOSIS — T8454XD Infection and inflammatory reaction due to internal left knee prosthesis, subsequent encounter: Secondary | ICD-10-CM | POA: Diagnosis not present

## 2019-05-27 DIAGNOSIS — R270 Ataxia, unspecified: Secondary | ICD-10-CM | POA: Diagnosis not present

## 2019-06-01 DIAGNOSIS — T8454XD Infection and inflammatory reaction due to internal left knee prosthesis, subsequent encounter: Secondary | ICD-10-CM | POA: Diagnosis not present

## 2019-06-01 DIAGNOSIS — Z9181 History of falling: Secondary | ICD-10-CM | POA: Diagnosis not present

## 2019-06-01 DIAGNOSIS — B9562 Methicillin resistant Staphylococcus aureus infection as the cause of diseases classified elsewhere: Secondary | ICD-10-CM | POA: Diagnosis not present

## 2019-06-01 DIAGNOSIS — R270 Ataxia, unspecified: Secondary | ICD-10-CM | POA: Diagnosis not present

## 2019-06-01 DIAGNOSIS — M549 Dorsalgia, unspecified: Secondary | ICD-10-CM | POA: Diagnosis not present

## 2019-06-01 DIAGNOSIS — G8929 Other chronic pain: Secondary | ICD-10-CM | POA: Diagnosis not present

## 2019-06-04 DIAGNOSIS — R270 Ataxia, unspecified: Secondary | ICD-10-CM | POA: Diagnosis not present

## 2019-06-04 DIAGNOSIS — B9562 Methicillin resistant Staphylococcus aureus infection as the cause of diseases classified elsewhere: Secondary | ICD-10-CM | POA: Diagnosis not present

## 2019-06-04 DIAGNOSIS — Z9181 History of falling: Secondary | ICD-10-CM | POA: Diagnosis not present

## 2019-06-04 DIAGNOSIS — T8454XD Infection and inflammatory reaction due to internal left knee prosthesis, subsequent encounter: Secondary | ICD-10-CM | POA: Diagnosis not present

## 2019-06-04 DIAGNOSIS — G8929 Other chronic pain: Secondary | ICD-10-CM | POA: Diagnosis not present

## 2019-06-04 DIAGNOSIS — M549 Dorsalgia, unspecified: Secondary | ICD-10-CM | POA: Diagnosis not present

## 2019-06-10 ENCOUNTER — Other Ambulatory Visit: Payer: Self-pay

## 2019-06-10 ENCOUNTER — Ambulatory Visit (INDEPENDENT_AMBULATORY_CARE_PROVIDER_SITE_OTHER): Payer: Medicare Other | Admitting: Orthopaedic Surgery

## 2019-06-10 ENCOUNTER — Encounter: Payer: Self-pay | Admitting: Orthopaedic Surgery

## 2019-06-10 DIAGNOSIS — I6523 Occlusion and stenosis of bilateral carotid arteries: Secondary | ICD-10-CM | POA: Diagnosis not present

## 2019-06-10 DIAGNOSIS — M25561 Pain in right knee: Secondary | ICD-10-CM

## 2019-06-10 DIAGNOSIS — M1711 Unilateral primary osteoarthritis, right knee: Secondary | ICD-10-CM

## 2019-06-10 DIAGNOSIS — G8929 Other chronic pain: Secondary | ICD-10-CM | POA: Diagnosis not present

## 2019-06-10 MED ORDER — LIDOCAINE HCL 1 % IJ SOLN
3.0000 mL | INTRAMUSCULAR | Status: AC | PRN
  Administered 2019-06-10: 3 mL

## 2019-06-10 MED ORDER — METHYLPREDNISOLONE ACETATE 40 MG/ML IJ SUSP
40.0000 mg | INTRAMUSCULAR | Status: AC | PRN
  Administered 2019-06-10: 15:00:00 40 mg via INTRA_ARTICULAR

## 2019-06-10 NOTE — Progress Notes (Signed)
Office Visit Note   Patient: [name]           Date of Birth: 10-17-1940           MRN: [id number] Visit Date: 06/10/2019              Requested by: Darreld Mclean, MD [address] [place],  [place] [address] PCP: Darreld Mclean, MD   Assessment & Plan: Visit Diagnoses:  1. Unilateral primary osteoarthritis, right knee   2. Chronic pain of right knee     Plan: Per her request I did provide a steroid injection in her right knee today which she tolerated well.  Having had this before she is fully aware of the risk and benefits of steroid injections.  She also knows that she needs to wait at least 3 to 4 months between injections.  All question concerns were answered and addressed.  Follow-up will be as needed otherwise.  Follow-Up Instructions: Return if symptoms worsen or fail to improve.   Orders:  Orders Placed This Encounter  Procedures  . Large Joint Inj   No orders of the defined types were placed in this encounter.     Procedures: Large Joint Inj: R knee on 06/10/2019 2:40 PM Indications: diagnostic evaluation and pain Details: 22 G 1.5 in needle, superolateral approach  Arthrogram: No  Medications: 3 mL lidocaine 1 %; 40 mg methylPREDNISolone acetate 40 MG/ML Outcome: tolerated well, no immediate complications Procedure, treatment alternatives, risks and benefits explained, specific risks discussed. Consent was given by the patient. Immediately prior to procedure a time out was called to verify the correct patient, procedure, equipment, support staff and site/side marked as required. Patient was prepped and draped in the usual sterile fashion.       Clinical Data: No additional findings.   Subjective: Chief Complaint  Patient presents with  . Right Knee - Pain  Patient is a very pleasant 78 year old female well-known to me.  She has severe arthritis in her right knee.  She comes in today with acute flareup of knee pain  requesting steroid injection in the right knee.  She has had a steroid injection in the past and that has helped.  More recently she had a hyaluronic acid injection with Monovisc into the right knee.  This was in August of this year and she said that did not help at all.  She says that she is not a candidate for knee replacement surgery given an infection that she had in her other knee before.  She is not interested in any type of surgery either.  She has had no other acute changes in her medical status other than significant right knee pain.  HPI  Review of Systems She currently denies any headache, chest pain, shortness of breath, fever, chills, nausea, vomiting  Objective: Vital Signs: There were no vitals taken for this visit.  Physical Exam She is alert and orient x3 and in no acute distress.  She is in a wheelchair. Ortho Exam Examination of her right knee shows a painful arc of motion of the knee.  She has a mild effusion as well.  There is no evidence of infection.  There is significant patellofemoral crepitation. Specialty Comments:  No specialty comments available.  Imaging: No results found. Previous films were reviewed shows severe arthritis of the right knee.  PMFS History: Patient Active Problem List   Diagnosis Date Noted  . Syncope 04/01/2019  . Closed  fracture of multiple pubic rami ([place]) 07/22/2018  . Closed fracture of right superior rim of pubis ([place])   . Lateral dislocation of left patella 01/15/2017  . Unilateral primary osteoarthritis, right knee 01/15/2017  . Osteoporosis 11/26/2016  . Acute pain of left knee 08/27/2016  . Chronic diarrhea 08/07/2016  . Left displaced femoral neck fracture ([place]) 06/26/2016  . MRSA (methicillin resistant Staphylococcus aureus) infection 06/26/2016  . Hypertensive heart disease   . Hydronephrosis   . Obstructive uropathy   . Abnormal echocardiogram 01/23/2016  . Pressure ulcer 01/21/2016  . Chronic infection of prosthetic  knee ([place]) 10/12/2015  . AAA 01/19/2009  . ILIAC ARTERY ANEURYSM 01/19/2009  . Hyperlipidemia 01/14/2009  . TOBACCO ABUSE 01/14/2009  . Essential hypertension 01/14/2009  . Coronary atherosclerosis 01/14/2009  . Cerebrovascular disease 01/14/2009  . Peripheral vascular disease ([place]) 01/14/2009  . CHRONIC OBSTRUCTIVE PULMONARY DISEASE 01/14/2009   Past Medical History:  Diagnosis Date  . Arthritis   . CAD (coronary artery disease)    a. 02/2007 s/p PCI/DES ot LCX and RCA;  b. 07/2014 MV: no ischemia/infarct, EF 68%.  . Carotid arterial disease ([place])    a. 05/2015 Carotid U/S: RICA 7-40%, LICA 81-44%, RECA 8-18%, LECA >50% - overall stable, f/u 1 yr.  . Complication of anesthesia    Three days after procedure pt statrted to suffer with memoy loss that took about 4-5 days to come back according to [name]."  . Dilated cardiomyopathy ([place])    a. 05/2005 Echo: EF 65%; b. 01/22/2016 Echo: EF 25-30% (in setting of admission for urosepsis w/ elev trop); c. 01/26/2016 Echo: EF 35-40%, inf, distal apical, apical, and mid to distal septal HK-->felt to be 2/2 stress induced CM.  [name] Heart murmur   . History of bronchitis   . Hyperlipidemia   . Hypertension   . Hypertensive heart disease   . Memory impairment   . MRSA (methicillin resistant staph aureus) culture positive   . Nephrolithiasis    a. 12/2015 UVJ stone w/ hydroureteronephrosis req nephrostomy tube placement.  . Osteoarthritis    a. 11/6312 s/p L TKA complicated by infections req skin grafting.  [name] PVD (peripheral vascular disease) ([place])    a. Bilat common iliac dzs, nl ABI's --> med rx.  . Sepsis ([place])    a. 12/2015 admitted with Proteus mirabilis urosepsis/bacteremia.  . Wears glasses     Family History  Problem Relation Age of Onset  . Breast cancer Mother   . Leukemia Father   . Stroke Sister   . Bone cancer Other     Past Surgical History:  Procedure Laterality Date  . ABDOMINAL HYSTERECTOMY    . APPLICATION OF A-CELL OF  EXTREMITY Left 12/08/2015   Procedure: APPLICATION OF A-CELL OF knee;  Surgeon: Loel Lofty [name], DO;  Location: [facility];  Service: Plastics;  Laterality: Left;  . APPLICATION OF WOUND VAC Left 11/16/2015   Procedure: APPLICATION OF WOUND VAC;  Surgeon: Loel Lofty [name], DO;  Location: [facility];  Service: Plastics;  Laterality: Left;  . APPLICATION OF WOUND VAC Left 12/08/2015   Procedure: APPLICATION OF WOUND VAC  AND CAST to knee;  Surgeon: Loel Lofty [name], DO;  Location: [facility];  Service: Plastics;  Laterality: Left;  . COLONOSCOPY    . CORONARY ANGIOPLASTY  07   3 stents placed  . ESOPHAGOGASTRODUODENOSCOPY    . EYE SURGERY Bilateral    cataracts  . FREE FLAP TO EXTREMITY Left 11/16/2015   Procedure:  FREE FLAP TO EXTREMITY;  Surgeon: Loel Lofty [name], DO;  Location: [facility];  Service: Plastics;  Laterality: Left;  . I&D EXTREMITY Left 10/18/2015   Procedure: Left Knee Washout, Reclosure;  Surgeon: Newt Minion, MD;  Location: [facility];  Service: Orthopedics;  Laterality: Left;  . I&D EXTREMITY Left 12/08/2015   Procedure: IRRIGATION AND DEBRIDEMENT knee;  Surgeon: Loel Lofty [name], DO;  Location: [facility];  Service: Plastics;  Laterality: Left;  . I&D KNEE WITH POLY EXCHANGE Left 10/12/2015   Procedure: Irrigation and Debridement , Poly Exchange, Antibiotic Beads Left Knee;  Surgeon: Newt Minion, MD;  Location: [facility];  Service: Orthopedics;  Laterality: Left;  . INCISION AND DRAINAGE OF WOUND Left 01/04/2016   Procedure: DEBRIDEMENT OF LEFT KNEE WOUND WITH ACELL AND WOUND VAC PLACEMENT;  Surgeon: Loel Lofty [name], DO;  Location: [facility];  Service: Plastics;  Laterality: Left;  . IR GENERIC HISTORICAL  05/11/2016   IR FLUORO GUIDE CV MIDLINE PICC RIGHT 05/11/2016 Sandi Mariscal, MD [facility]  . IR GENERIC HISTORICAL  05/11/2016   IR US GUIDE VASC ACCESS RIGHT 05/11/2016 Sandi Mariscal, MD [facility]  . KNEE CLOSED REDUCTION Left 01/16/2017   Procedure: CLOSED MANIPULATION LEFT KNEE;   Surgeon: Leandrew Koyanagi, MD;  Location: [facility];  Service: Orthopedics;  Laterality: Left;  . KNEE SURGERY     denies  . SKIN SPLIT GRAFT Left 11/16/2015   Procedure: LEFT GASTROC MUSCLE FLAP WITH SKIN GRAFT SPLIT THICKNESS AND VAC PLACEMENT;  Surgeon: Loel Lofty [name], DO;  Location: [facility];  Service: Plastics;  Laterality: Left;  . TOTAL HIP ARTHROPLASTY Right 09/10/2012   Dr Sharol Given  . TOTAL HIP ARTHROPLASTY Right 09/10/2012   Procedure: TOTAL HIP ARTHROPLASTY;  Surgeon: Newt Minion, MD;  Location: [facility];  Service: Orthopedics;  Laterality: Right;  Right Total Hip Arthroplasty  . TOTAL HIP ARTHROPLASTY Left 06/27/2016   Procedure: LEFT TOTAL HIP ARTHROPLASTY ANTERIOR APPROACH;  Surgeon: Leandrew Koyanagi, MD;  Location: [facility];  Service: Orthopedics;  Laterality: Left;  . TOTAL KNEE ARTHROPLASTY Left 09/28/2015   Procedure: TOTAL KNEE ARTHROPLASTY;  Surgeon: Newt Minion, MD;  Location: [facility];  Service: Orthopedics;  Laterality: Left;   Social History   Occupational History  . Occupation: Retired  Tobacco Use  . Smoking status: Former Smoker    Packs/day: 0.50    Years: 50.00    Pack years: 25.00    Types: Cigarettes    Quit date: 09/28/2015    Years since quitting: 3.7  . Smokeless tobacco: Never Used  . Tobacco comment: quit 09/27/15  Substance and Sexual Activity  . Alcohol use: No  . Drug use: No  . Sexual activity: Never

## 2019-07-02 ENCOUNTER — Other Ambulatory Visit: Payer: Self-pay | Admitting: Internal Medicine

## 2019-07-06 ENCOUNTER — Other Ambulatory Visit: Payer: Self-pay

## 2019-07-06 ENCOUNTER — Ambulatory Visit (HOSPITAL_COMMUNITY): Payer: Medicare Other | Attending: Cardiology

## 2019-07-06 DIAGNOSIS — I350 Nonrheumatic aortic (valve) stenosis: Secondary | ICD-10-CM | POA: Diagnosis not present

## 2019-07-06 MED ORDER — AMLODIPINE BESYLATE 10 MG PO TABS: 10.0000 mg | ORAL_TABLET | Freq: Every day | ORAL | Status: DC

## 2019-07-08 ENCOUNTER — Other Ambulatory Visit: Payer: Self-pay | Admitting: Cardiology

## 2019-07-08 MED ORDER — AMLODIPINE BESYLATE 10 MG PO TABS: 10.0000 mg | ORAL_TABLET | Freq: Every day | ORAL | 1 refills | Status: DC

## 2019-07-08 NOTE — Telephone Encounter (Signed)
Pt's medication was sent to pt's pharmacy as requested. Confirmation received.  °

## 2019-08-03 ENCOUNTER — Other Ambulatory Visit: Payer: Self-pay | Admitting: Physician Assistant

## 2019-08-03 NOTE — Telephone Encounter (Signed)
Please advise 

## 2019-08-26 NOTE — Progress Notes (Signed)
HPI: FU coronary artery disease. She is status post PCI of her circumflex and right coronary artery with drug-eluting stents in August 2008. Abdominal ultrasound in January 2014 showed moderate right and severe left common iliac stenosis. ABIs in January of 2014 were normal. Patient seen by Dr Fletcher Anon and medical therapy recommended for now.Nuclear study 1/16 showed EF 68 and normal perfusion. Pt had probable stress induced CM 6/17 in setting of urosepsis.Also with GI bleed 7/17 related to gastic AVM; ablated.Patient had syncope September 2020 felt secondary to dehydration/orthostasis from chronic diarrhea.  Carotid Doppler September 2020 showed 1 to 39% bilateral stenosis.  There was note of right subclavian stenosis.  Echocardiogram December 2020 showed normal LV systolic function, grade 1 diastolic dysfunction, mild left atrial enlargement, mild aortic stenosis with mean gradient 12 mmHg and mild aortic insufficiency.  Since I last saw her,  there is no dyspnea, chest pain, palpitations or syncope.  She is limited because of left knee pain from previous surgery and infection.  Current Outpatient Medications  Medication Sig Dispense Refill  . amLODipine (NORVASC) 10 MG tablet Take 1 tablet (10 mg total) by mouth daily. 90 tablet 1  . aspirin EC 81 MG tablet Take 81 mg by mouth daily.    [name] atorvastatin (LIPITOR) 40 MG tablet Take 1 tablet (40 mg total) by mouth daily. 90 tablet 3  . Calcium Carbonate-Vit D-Min (CALCIUM 1200 PO) Take 1 tablet by mouth daily.     . carvedilol (COREG) 25 MG tablet Take 1 tablet (25 mg total) by mouth 2 (two) times daily with a meal. 180 tablet 3  . diclofenac sodium (VOLTAREN) 1 % GEL Apply 2 g topically 4 (four) times daily. Use as needed for joint pain.  Max 32g/day total 100 g 3  . doxycycline (VIBRA-TABS) 100 MG tablet TAKE ONE TABLET BY MOUTH TWICE A DAY (Patient taking differently: Take 100 mg by mouth 2 (two) times daily. ) 60 tablet 10  . furosemide  (LASIX) 20 MG tablet Take 1 tablet (20 mg total) by mouth every other day. 15 tablet 0  . hydrALAZINE (APRESOLINE) 25 MG tablet Take 1 tablet (25 mg total) by mouth every 8 (eight) hours. 90 tablet 0  . Magnesium Oxide (MAG-OXIDE) 200 MG TABS Take 200 mg by mouth every morning.     . Multiple Vitamin (MULITIVITAMIN WITH MINERALS) TABS Take 1 tablet by mouth daily.    . potassium chloride SA (K-DUR) 20 MEQ tablet Take 1 tablet (20 mEq total) by mouth every other day. With lasix. 90 tablet 2  . Probiotic Product (PROBIOTIC DAILY PO) Take 1 capsule by mouth daily.    . rifampin (RIFADIN) 300 MG capsule TAKE 1 CAPSULE BY MOUTH TWO TIMES A DAY 60 capsule 2  . traMADol (ULTRAM) 50 MG tablet TAKE ONE TABLET BY MOUTH TWICE A DAY 14 tablet 0  . lisinopril (ZESTRIL) 40 MG tablet Take 1 tablet (40 mg total) by mouth daily. 90 tablet 3  . mesalamine (LIALDA) 1.2 g EC tablet Take 2.4 g by mouth daily with breakfast.     No current facility-administered medications for this visit.     Past Medical History:  Diagnosis Date  . Arthritis   . CAD (coronary artery disease)    a. 02/2007 s/p PCI/DES ot LCX and RCA;  b. 07/2014 MV: no ischemia/infarct, EF 68%.  . Carotid arterial disease ([place])    a. 05/2015 Carotid U/S: RICA 9-93%, LICA 71-69%, RECA 6-78%, LECA >  50% - overall stable, f/u 1 yr.  . Complication of anesthesia    Three days after procedure pt statrted to suffer with memoy loss that took about 4-5 days to come back according to [name]."  . Dilated cardiomyopathy ([place])    a. 05/2005 Echo: EF 65%; b. 01/22/2016 Echo: EF 25-30% (in setting of admission for urosepsis w/ elev trop); c. 01/26/2016 Echo: EF 35-40%, inf, distal apical, apical, and mid to distal septal HK-->felt to be 2/2 stress induced CM.  [name] Heart murmur   . History of bronchitis   . Hyperlipidemia   . Hypertension   . Hypertensive heart disease   . Memory impairment   . MRSA (methicillin resistant staph aureus) culture positive     . Nephrolithiasis    a. 12/2015 UVJ stone w/ hydroureteronephrosis req nephrostomy tube placement.  . Osteoarthritis    a. 12/628 s/p L TKA complicated by infections req skin grafting.  [name] PVD (peripheral vascular disease) ([place])    a. Bilat common iliac dzs, nl ABI's --> med rx.  . Sepsis ([place])    a. 12/2015 admitted with Proteus mirabilis urosepsis/bacteremia.  . Wears glasses     Past Surgical History:  Procedure Laterality Date  . ABDOMINAL HYSTERECTOMY    . APPLICATION OF A-CELL OF EXTREMITY Left 12/08/2015   Procedure: APPLICATION OF A-CELL OF knee;  Surgeon: Loel Lofty [name], DO;  Location: [facility];  Service: Plastics;  Laterality: Left;  . APPLICATION OF WOUND VAC Left 11/16/2015   Procedure: APPLICATION OF WOUND VAC;  Surgeon: Loel Lofty [name], DO;  Location: [facility];  Service: Plastics;  Laterality: Left;  . APPLICATION OF WOUND VAC Left 12/08/2015   Procedure: APPLICATION OF WOUND VAC  AND CAST to knee;  Surgeon: Loel Lofty [name], DO;  Location: [facility];  Service: Plastics;  Laterality: Left;  . COLONOSCOPY    . CORONARY ANGIOPLASTY  07   3 stents placed  . ESOPHAGOGASTRODUODENOSCOPY    . EYE SURGERY Bilateral    cataracts  . FREE FLAP TO EXTREMITY Left 11/16/2015   Procedure: FREE FLAP TO EXTREMITY;  Surgeon: Loel Lofty [name], DO;  Location: [facility];  Service: Plastics;  Laterality: Left;  . I & D EXTREMITY Left 10/18/2015   Procedure: Left Knee Washout, Reclosure;  Surgeon: Newt Minion, MD;  Location: [facility];  Service: Orthopedics;  Laterality: Left;  . I & D EXTREMITY Left 12/08/2015   Procedure: IRRIGATION AND DEBRIDEMENT knee;  Surgeon: Loel Lofty [name], DO;  Location: [facility];  Service: Plastics;  Laterality: Left;  . I & D KNEE WITH POLY EXCHANGE Left 10/12/2015   Procedure: Irrigation and Debridement , Poly Exchange, Antibiotic Beads Left Knee;  Surgeon: Newt Minion, MD;  Location: [facility];  Service: Orthopedics;  Laterality: Left;  . INCISION AND DRAINAGE OF WOUND  Left 01/04/2016   Procedure: DEBRIDEMENT OF LEFT KNEE WOUND WITH ACELL AND WOUND VAC PLACEMENT;  Surgeon: Loel Lofty [name], DO;  Location: [facility];  Service: Plastics;  Laterality: Left;  . IR GENERIC HISTORICAL  05/11/2016   IR FLUORO GUIDE CV MIDLINE PICC RIGHT 05/11/2016 Sandi Mariscal, MD [facility]  . IR GENERIC HISTORICAL  05/11/2016   IR US GUIDE VASC ACCESS RIGHT 05/11/2016 Sandi Mariscal, MD [facility]  . KNEE CLOSED REDUCTION Left 01/16/2017   Procedure: CLOSED MANIPULATION LEFT KNEE;  Surgeon: Leandrew Koyanagi, MD;  Location: [facility];  Service: Orthopedics;  Laterality: Left;  . KNEE SURGERY  denies  . SKIN SPLIT GRAFT Left 11/16/2015   Procedure: LEFT GASTROC MUSCLE FLAP WITH SKIN GRAFT SPLIT THICKNESS AND VAC PLACEMENT;  Surgeon: Loel Lofty [name], DO;  Location: [facility];  Service: Plastics;  Laterality: Left;  . TOTAL HIP ARTHROPLASTY Right 09/10/2012   Dr Sharol Given  . TOTAL HIP ARTHROPLASTY Right 09/10/2012   Procedure: TOTAL HIP ARTHROPLASTY;  Surgeon: Newt Minion, MD;  Location: [facility];  Service: Orthopedics;  Laterality: Right;  Right Total Hip Arthroplasty  . TOTAL HIP ARTHROPLASTY Left 06/27/2016   Procedure: LEFT TOTAL HIP ARTHROPLASTY ANTERIOR APPROACH;  Surgeon: Leandrew Koyanagi, MD;  Location: [facility];  Service: Orthopedics;  Laterality: Left;  . TOTAL KNEE ARTHROPLASTY Left 09/28/2015   Procedure: TOTAL KNEE ARTHROPLASTY;  Surgeon: Newt Minion, MD;  Location: [facility];  Service: Orthopedics;  Laterality: Left;    Social History   Socioeconomic History  . Marital status: Single    Spouse name: Not on file  . Number of children: Not on file  . Years of education: Not on file  . Highest education level: Not on file  Occupational History  . Occupation: Retired  Tobacco Use  . Smoking status: Former Smoker    Packs/day: 0.50    Years: 50.00    Pack years: 25.00    Types: Cigarettes    Quit date: 09/28/2015    Years since quitting: 3.9  . Smokeless tobacco:  Never Used  . Tobacco comment: quit 09/27/15  Substance and Sexual Activity  . Alcohol use: No  . Drug use: No  . Sexual activity: Never  Other Topics Concern  . Not on file  Social History Narrative   She has been living with her daughter when not in rehab since the surgery in March.   Social Determinants of Health   Financial Resource Strain:   . Difficulty of Paying Living Expenses: Not on file  Food Insecurity:   . Worried About Charity fundraiser in the Last Year: Not on file  . Ran Out of Food in the Last Year: Not on file  Transportation Needs:   . Lack of Transportation (Medical): Not on file  . Lack of Transportation (Non-Medical): Not on file  Physical Activity:   . Days of Exercise per Week: Not on file  . Minutes of Exercise per Session: Not on file  Stress:   . Feeling of Stress : Not on file  Social Connections:   . Frequency of Communication with Friends and Family: Not on file  . Frequency of Social Gatherings with Friends and Family: Not on file  . Attends Religious Services: Not on file  . Active Member of Clubs or Organizations: Not on file  . Attends Archivist Meetings: Not on file  . Marital Status: Not on file  Intimate Partner Violence:   . Fear of Current or Ex-Partner: Not on file  . Emotionally Abused: Not on file  . Physically Abused: Not on file  . Sexually Abused: Not on file    Family History  Problem Relation Age of Onset  . Breast cancer Mother   . Leukemia Father   . Stroke Sister   . Bone cancer Other     ROS: Left knee pain but no fevers or chills, productive cough, hemoptysis, dysphasia, odynophagia, melena, hematochezia, dysuria, hematuria, rash, seizure activity, orthopnea, PND, pedal edema, claudication. Remaining systems are negative.  Physical Exam: Well-developed well-nourished in no acute distress.  Skin is warm and dry.  HEENT is normal.  Neck is supple.  Chest is clear to auscultation with normal expansion.   Cardiovascular exam is regular rate and rhythm.  1/6 systolic murmur left sternal border.  S2 is not diminished. Abdominal exam nontender or distended. No masses palpated. Extremities show trace edema.  Left lower extremity in brace neuro grossly intact  A/P  1 coronary artery disease-patient denies chest pain.  Continue medical therapy including aspirin and statin.  2 hypertension-blood pressure controlled.  Continue present medications.  Check potassium and renal function.  3 hyperlipidemia-continue statin.  Check lipids and liver.  4 history of stress-induced cardiomyopathy-LV function has improved on most recent study.  5 history of aortic stenosis/aortic insufficiency-plan repeat echocardiogram 12/21.  6 carotid artery disease-mild on most recent carotid Dopplers.  7 peripheral vascular disease-patient is not having claudication.  Continue medical therapy with aspirin and statin.  Kirk Ruths, MD

## 2019-08-31 ENCOUNTER — Telehealth: Payer: Self-pay | Admitting: Cardiology

## 2019-08-31 ENCOUNTER — Other Ambulatory Visit: Payer: Self-pay | Admitting: Cardiology

## 2019-08-31 NOTE — Telephone Encounter (Signed)
Spoke with pt, Aware of dr crenshaw's recommendations.  °

## 2019-08-31 NOTE — Telephone Encounter (Signed)
Yes [name]  

## 2019-08-31 NOTE — Telephone Encounter (Signed)
New message:     Patient has to bring someone with her because she can not walk. Please call patient if there are any questions.

## 2019-09-01 ENCOUNTER — Encounter: Payer: Self-pay | Admitting: Cardiology

## 2019-09-01 ENCOUNTER — Other Ambulatory Visit: Payer: Self-pay

## 2019-09-01 ENCOUNTER — Ambulatory Visit (INDEPENDENT_AMBULATORY_CARE_PROVIDER_SITE_OTHER): Payer: Medicare Other | Admitting: Cardiology

## 2019-09-01 VITALS — BP 140/70 | HR 64 | Temp 96.8°F | Ht 62.0 in | Wt 120.0 lb

## 2019-09-01 DIAGNOSIS — I10 Essential (primary) hypertension: Secondary | ICD-10-CM

## 2019-09-01 DIAGNOSIS — E7800 Pure hypercholesterolemia, unspecified: Secondary | ICD-10-CM | POA: Diagnosis not present

## 2019-09-01 DIAGNOSIS — I2510 Atherosclerotic heart disease of native coronary artery without angina pectoris: Secondary | ICD-10-CM

## 2019-09-01 DIAGNOSIS — I350 Nonrheumatic aortic (valve) stenosis: Secondary | ICD-10-CM

## 2019-09-01 MED ORDER — HYDRALAZINE HCL 25 MG PO TABS: 25.0000 mg | ORAL_TABLET | Freq: Three times a day (TID) | ORAL | 3 refills | Status: DC

## 2019-09-01 MED ORDER — FUROSEMIDE 20 MG PO TABS: 20.0000 mg | ORAL_TABLET | ORAL | 3 refills | Status: DC

## 2019-09-01 NOTE — Addendum Note (Signed)
Addended by: Cristopher Estimable on: 09/01/2019 10:20 AM   Modules accepted: Orders

## 2019-09-01 NOTE — Patient Instructions (Signed)
Medication Instructions:  NO CHANGE *If you need a refill on your cardiac medications before your next appointment, please call your pharmacy*  Lab Work: Your physician recommends that you return for lab work PRIOR TO EATING  If you have labs (blood work) drawn today and your tests are completely normal, you will receive your results only by: [name] MyChart Message (if you have MyChart) OR . A paper copy in the mail If you have any lab test that is abnormal or we need to change your treatment, we will call you to review the results.  Follow-Up: At [facility], you and your health needs are our priority.  As part of our continuing mission to provide you with exceptional heart care, we have created designated Provider Care Teams.  These Care Teams include your primary Cardiologist (physician) and Advanced Practice Providers (APPs -  Physician Assistants and Nurse Practitioners) who all work together to provide you with the care you need, when you need it.  Your next appointment:   6 month(s)  The format for your next appointment:   Either In Person or Virtual  Provider:   You may see Kirk Ruths MD or one of the following Advanced Practice Providers on your designated Care Team:    Kerin Ransom, PA-C  [place], [place]  Coletta Memos, [place]

## 2019-09-15 DIAGNOSIS — I10 Essential (primary) hypertension: Secondary | ICD-10-CM | POA: Diagnosis not present

## 2019-09-15 DIAGNOSIS — E7800 Pure hypercholesterolemia, unspecified: Secondary | ICD-10-CM | POA: Diagnosis not present

## 2019-09-15 LAB — COMPREHENSIVE METABOLIC PANEL
ALT: 14 IU/L (ref 0–32)
AST: 25 IU/L (ref 0–40)
Albumin/Globulin Ratio: 1.6 (ref 1.2–2.2)
Albumin: 3.8 g/dL (ref 3.7–4.7)
Alkaline Phosphatase: 103 IU/L (ref 39–117)
BUN/Creatinine Ratio: 23 (ref 12–28)
BUN: 23 mg/dL (ref 8–27)
Bilirubin Total: 0.3 mg/dL (ref 0.0–1.2)
CO2: 24 mmol/L (ref 20–29)
Calcium: 9.6 mg/dL (ref 8.7–10.3)
Chloride: 105 mmol/L (ref 96–106)
Creatinine, Ser: 1 mg/dL (ref 0.57–1.00)
GFR calc Af Amer: 62 mL/min/{1.73_m2} (ref >59)
GFR calc non Af Amer: 54 mL/min/{1.73_m2} — ABNORMAL LOW (ref >59)
Globulin, Total: 2.4 g/dL (ref 1.5–4.5)
Glucose: 93 mg/dL (ref 65–99)
Potassium: 5.1 mmol/L (ref 3.5–5.2)
Sodium: 141 mmol/L (ref 134–144)
Total Protein: 6.2 g/dL (ref 6.0–8.5)

## 2019-09-15 LAB — LIPID PANEL
Chol/HDL Ratio: 2.1 ratio (ref 0.0–4.4)
Cholesterol, Total: 162 mg/dL (ref 100–199)
HDL: 78 mg/dL (ref >39)
LDL Chol Calc (NIH): 70 mg/dL (ref 0–99)
Triglycerides: 70 mg/dL (ref 0–149)
VLDL Cholesterol Cal: 14 mg/dL (ref 5–40)

## 2019-09-29 ENCOUNTER — Other Ambulatory Visit: Payer: Self-pay | Admitting: Internal Medicine

## 2019-10-01 ENCOUNTER — Other Ambulatory Visit: Payer: Self-pay

## 2019-10-01 ENCOUNTER — Encounter: Payer: Self-pay | Admitting: Internal Medicine

## 2019-10-01 ENCOUNTER — Ambulatory Visit (INDEPENDENT_AMBULATORY_CARE_PROVIDER_SITE_OTHER): Payer: Medicare Other | Admitting: Internal Medicine

## 2019-10-01 DIAGNOSIS — Z96659 Presence of unspecified artificial knee joint: Secondary | ICD-10-CM | POA: Diagnosis not present

## 2019-10-01 DIAGNOSIS — T8459XD Infection and inflammatory reaction due to other internal joint prosthesis, subsequent encounter: Secondary | ICD-10-CM

## 2019-10-01 DIAGNOSIS — I2510 Atherosclerotic heart disease of native coronary artery without angina pectoris: Secondary | ICD-10-CM | POA: Diagnosis not present

## 2019-10-01 NOTE — Progress Notes (Signed)
[facility] for Infectious Disease  Patient Active Problem List   Diagnosis Date Noted  . MRSA (methicillin resistant Staphylococcus aureus) infection 06/26/2016    Priority: High  . Chronic infection of prosthetic knee ([place]) 10/12/2015    Priority: High  . Syncope 04/01/2019  . Closed fracture of multiple pubic rami ([place]) 07/22/2018  . Closed fracture of right superior rim of pubis ([place])   . Lateral dislocation of left patella 01/15/2017  . Unilateral primary osteoarthritis, right knee 01/15/2017  . Osteoporosis 11/26/2016  . Acute pain of left knee 08/27/2016  . Chronic diarrhea 08/07/2016  . Left displaced femoral neck fracture ([place]) 06/26/2016  . Hypertensive heart disease   . Hydronephrosis   . Obstructive uropathy   . Abnormal echocardiogram 01/23/2016  . Pressure ulcer 01/21/2016  . AAA 01/19/2009  . ILIAC ARTERY ANEURYSM 01/19/2009  . Hyperlipidemia 01/14/2009  . TOBACCO ABUSE 01/14/2009  . Essential hypertension 01/14/2009  . Coronary atherosclerosis 01/14/2009  . Cerebrovascular disease 01/14/2009  . Peripheral vascular disease ([place]) 01/14/2009  . CHRONIC OBSTRUCTIVE PULMONARY DISEASE 01/14/2009    Patient's Medications  New Prescriptions   No medications on file  Previous Medications   AMLODIPINE (NORVASC) 10 MG TABLET    Take 1 tablet (10 mg total) by mouth daily.   ASPIRIN EC 81 MG TABLET    Take 81 mg by mouth daily.   ATORVASTATIN (LIPITOR) 40 MG TABLET    Take 1 tablet (40 mg total) by mouth daily.   CALCIUM CARBONATE-VIT D-MIN (CALCIUM 1200 PO)    Take 1 tablet by mouth daily.    CARVEDILOL (COREG) 25 MG TABLET    Take 1 tablet (25 mg total) by mouth 2 (two) times daily with a meal.   DICLOFENAC SODIUM (VOLTAREN) 1 % GEL    Apply 2 g topically 4 (four) times daily. Use as needed for joint pain.  Max 32g/day total   DOXYCYCLINE (VIBRA-TABS) 100 MG TABLET    TAKE ONE TABLET BY MOUTH TWICE A DAY   FUROSEMIDE (LASIX) 20 MG TABLET    Take 1  tablet (20 mg total) by mouth every other day.   HYDRALAZINE (APRESOLINE) 25 MG TABLET    Take 1 tablet (25 mg total) by mouth every 8 (eight) hours.   LISINOPRIL (ZESTRIL) 40 MG TABLET    Take 1 tablet (40 mg total) by mouth daily.   MAGNESIUM OXIDE (MAG-OXIDE) 200 MG TABS    Take 200 mg by mouth every morning.    MESALAMINE (LIALDA) 1.2 G EC TABLET    Take 2.4 g by mouth daily with breakfast.   MULTIPLE VITAMIN (MULITIVITAMIN WITH MINERALS) TABS    Take 1 tablet by mouth daily.   POTASSIUM CHLORIDE SA (K-DUR) 20 MEQ TABLET    Take 1 tablet (20 mEq total) by mouth every other day. With lasix.   PROBIOTIC PRODUCT (PROBIOTIC DAILY PO)    Take 1 capsule by mouth daily.   RIFAMPIN (RIFADIN) 300 MG CAPSULE    TAKE ONE CAPSULE BY MOUTH TWICE A DAY   TRAMADOL (ULTRAM) 50 MG TABLET    TAKE ONE TABLET BY MOUTH TWICE A DAY  Modified Medications   No medications on file  Discontinued Medications   No medications on file    Subjective: Ms. Khalid is in with her daughter-in-law for her routine follow-up visit.  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  She has more frequent soft stools over the past few years.  She says she is not sure if that is related to her antibiotics because she takes so many medications.  She is taking Imodium daily and a patch that this controls the problem.  She is not having any abdominal pain, nausea, vomiting or fever.  She does not have any open sores on her left knee and has not had any further drainage.  Review of Systems: Review of Systems  Constitutional: Negative for chills, diaphoresis and fever.  Gastrointestinal: Negative for abdominal pain, diarrhea, nausea and vomiting.  Musculoskeletal: Positive for joint pain.    Past Medical History:  Diagnosis Date  . Arthritis   . CAD (coronary artery disease)    a. 02/2007 s/p PCI/DES ot LCX and RCA;  b. 07/2014 MV: no  ischemia/infarct, EF 68%.  . Carotid arterial disease ([place])    a. 05/2015 Carotid U/S: RICA 6-23%, LICA 76-28%, RECA 3-15%, LECA >50% - overall stable, f/u 1 yr.  . Complication of anesthesia    Three days after procedure pt statrted to suffer with memoy loss that took about 4-5 days to come back according to [name]."  . Dilated cardiomyopathy ([place])    a. 05/2005 Echo: EF 65%; b. 01/22/2016 Echo: EF 25-30% (in setting of admission for urosepsis w/ elev trop); c. 01/26/2016 Echo: EF 35-40%, inf, distal apical, apical, and mid to distal septal HK-->felt to be 2/2 stress induced CM.  [name] Heart murmur   . History of bronchitis   . Hyperlipidemia   . Hypertension   . Hypertensive heart disease   . Memory impairment   . MRSA (methicillin resistant staph aureus) culture positive   . Nephrolithiasis    a. 12/2015 UVJ stone w/ hydroureteronephrosis req nephrostomy tube placement.  . Osteoarthritis    a. 07/7614 s/p L TKA complicated by infections req skin grafting.  [name] PVD (peripheral vascular disease) ([place])    a. Bilat common iliac dzs, nl ABI's --> med rx.  . Sepsis ([place])    a. 12/2015 admitted with Proteus mirabilis urosepsis/bacteremia.  . Wears glasses     Social History   Tobacco Use  . Smoking status: Former Smoker    Packs/day: 0.50    Years: 50.00    Pack years: 25.00    Types: Cigarettes    Quit date: 09/28/2015    Years since quitting: 4.0  . Smokeless tobacco: Never Used  . Tobacco comment: quit 09/27/15  Substance Use Topics  . Alcohol use: No  . Drug use: No    Family History  Problem Relation Age of Onset  . Breast cancer Mother   . Leukemia Father   . Stroke Sister   . Bone cancer Other     No Known Allergies  Objective: Vitals:   10/01/19 1334  BP: (!) 184/77  Pulse: 62  Temp: 98 F (36.7 C)  TempSrc: Oral  Weight: 120 lb (54.4 kg)   Body mass index is 21.95 kg/m.  Physical Exam Constitutional:      Comments: She is pleasant and in no distress.     Musculoskeletal:     Comments: She has extensive scars and anatomical deformity of her left knee but all incisions are healed.  There are no open draining lesions and the eschar on her lateral knee has resolved.  She is wearing a metal brace.  She is seated in a wheelchair.     Problem List Items  Addressed This Visit      High   Chronic infection of prosthetic knee ([place])    I told her again that I think she has chronic MRSA prosthetic knee infection that is incurable with antibiotic/medical therapy alone.  She does not desire any further surgery and her orthopedic surgeons have not offered her any surgery.  I think the lesser evil is clearly to continue doxycycline and rifampin indefinitely.  She is in agreement with that plan.  She will follow-up in 1 year.          Michel Bickers, MD [facility] [place] [facility] [phone] pager   [phone] cell 10/01/2019, 2:10 PM

## 2019-10-01 NOTE — Assessment & Plan Note (Signed)
I told her again that I think she has chronic MRSA prosthetic knee infection that is incurable with antibiotic/medical therapy alone.  She does not desire any further surgery and her orthopedic surgeons have not offered her any surgery.  I think the lesser evil is clearly to continue doxycycline and rifampin indefinitely.  She is in agreement with that plan.  She will follow-up in 1 year.

## 2019-10-22 ENCOUNTER — Other Ambulatory Visit: Payer: Self-pay | Admitting: Orthopaedic Surgery

## 2019-10-22 ENCOUNTER — Telehealth: Payer: Self-pay | Admitting: Orthopaedic Surgery

## 2019-10-22 MED ORDER — TRAMADOL HCL 50 MG PO TABS: 50.0000 mg | ORAL_TABLET | Freq: Two times a day (BID) | ORAL | 0 refills | Status: DC | PRN

## 2019-10-22 NOTE — Telephone Encounter (Signed)
Please advise 

## 2019-10-22 NOTE — Telephone Encounter (Signed)
Patient called.   She is wondering if she can be prescribed something for her pain to hold her until her appointment on 4/7.   Call back: [phone]

## 2019-10-22 NOTE — Telephone Encounter (Signed)
I will send in some tramadol.

## 2019-11-04 ENCOUNTER — Encounter: Payer: Self-pay | Admitting: Orthopaedic Surgery

## 2019-11-04 ENCOUNTER — Other Ambulatory Visit: Payer: Self-pay

## 2019-11-04 ENCOUNTER — Ambulatory Visit (INDEPENDENT_AMBULATORY_CARE_PROVIDER_SITE_OTHER): Payer: Medicare Other | Admitting: Orthopaedic Surgery

## 2019-11-04 DIAGNOSIS — M25561 Pain in right knee: Secondary | ICD-10-CM

## 2019-11-04 DIAGNOSIS — M1711 Unilateral primary osteoarthritis, right knee: Secondary | ICD-10-CM

## 2019-11-04 DIAGNOSIS — G8929 Other chronic pain: Secondary | ICD-10-CM | POA: Diagnosis not present

## 2019-11-04 DIAGNOSIS — I2510 Atherosclerotic heart disease of native coronary artery without angina pectoris: Secondary | ICD-10-CM | POA: Diagnosis not present

## 2019-11-04 MED ORDER — TRAMADOL HCL 50 MG PO TABS: 50.0000 mg | ORAL_TABLET | Freq: Two times a day (BID) | ORAL | 0 refills | Status: DC | PRN

## 2019-11-04 MED ORDER — LIDOCAINE HCL 1 % IJ SOLN
3.0000 mL | INTRAMUSCULAR | Status: AC | PRN
  Administered 2019-11-04: 16:00:00 3 mL

## 2019-11-04 MED ORDER — METHYLPREDNISOLONE ACETATE 40 MG/ML IJ SUSP
40.0000 mg | INTRAMUSCULAR | Status: AC | PRN
  Administered 2019-11-04: 40 mg via INTRA_ARTICULAR

## 2019-11-04 NOTE — Progress Notes (Signed)
Office Visit Note   Patient: [name]           Date of Birth: 31-Aug-1940           MRN: [id number] Visit Date: 11/04/2019              Requested by: Darreld Mclean, MD [address] [place],   [id number] PCP: Darreld Mclean, MD   Assessment & Plan: Visit Diagnoses:  1. Unilateral primary osteoarthritis, right knee   2. Chronic pain of right knee     Plan: Per the patient's wishes I did provide a steroid injection in her right knee without difficulty.  She knows we can repeat this in 3 to 4 months again as needed.  There is no other recommendations for her.  I did send in some more tramadol as well.  All questions and concerns were answered and addressed.  Follow-Up Instructions: Return if symptoms worsen or fail to improve.   Orders:  Orders Placed This Encounter  Procedures  . Large Joint Inj   Meds ordered this encounter  Medications  . traMADol (ULTRAM) 50 MG tablet    Sig: Take 1-2 tablets (50-100 mg total) by mouth every 12 (twelve) hours as needed.    Dispense:  30 tablet    Refill:  0      Procedures: Large Joint Inj: R knee on 11/04/2019 4:20 PM Indications: diagnostic evaluation and pain Details: 22 G 1.5 in needle, superolateral approach  Arthrogram: No  Medications: 3 mL lidocaine 1 %; 40 mg methylPREDNISolone acetate 40 MG/ML Outcome: tolerated well, no immediate complications Procedure, treatment alternatives, risks and benefits explained, specific risks discussed. Consent was given by the patient. Immediately prior to procedure a time out was called to verify the correct patient, procedure, equipment, support staff and site/side marked as required. Patient was prepped and draped in the usual sterile fashion.       Clinical Data: No additional findings.   Subjective: Chief Complaint  Patient presents with  . Right Knee - Follow-up  The patient is a very pleasant 79 year old female and well-known to [place].  She  has severe end-stage arthritis of the right knee with debilitating pain.  She is not a surgical candidate at this point given several medical issues.  She has had a previous chronic infection of a left knee arthroplasty.  She is requesting a steroid injection today in her right knee.  She has had no other acute change in her medical status.  She also states that tramadol has helped and she would like a refill of her tramadol.  She last had a steroid in the knee on the right side about 5 months ago.  Her daughter is with her today  HPI  Review of Systems A.  She currently denies any headache, chest pain, shortness of breath, fever, chills, nausea, vomiting  Objective: Vital Signs: There were no vitals taken for this visit.  Physical Exam She is alert and orient x3 and in no acute distress Ortho Exam Examination of her right knee shows pain and swelling at the right knee joint.  There is grinding throughout flexion and extension with severe pain palpating medial and laterally as well as at the patellofemoral joint. Specialty Comments:  No specialty comments available.  Imaging: No results found.   PMFS History: Patient Active Problem List   Diagnosis Date Noted  . Syncope 04/01/2019  . Closed fracture of multiple pubic rami ([place]) 07/22/2018  .  Closed fracture of right superior rim of pubis ([place])   . Lateral dislocation of left patella 01/15/2017  . Unilateral primary osteoarthritis, right knee 01/15/2017  . Osteoporosis 11/26/2016  . Acute pain of left knee 08/27/2016  . Chronic diarrhea 08/07/2016  . Left displaced femoral neck fracture ([place]) 06/26/2016  . MRSA (methicillin resistant Staphylococcus aureus) infection 06/26/2016  . Hypertensive heart disease   . Hydronephrosis   . Obstructive uropathy   . Abnormal echocardiogram 01/23/2016  . Pressure ulcer 01/21/2016  . Chronic infection of prosthetic knee ([place]) 10/12/2015  . AAA 01/19/2009  . ILIAC ARTERY ANEURYSM 01/19/2009    . Hyperlipidemia 01/14/2009  . TOBACCO ABUSE 01/14/2009  . Essential hypertension 01/14/2009  . Coronary atherosclerosis 01/14/2009  . Cerebrovascular disease 01/14/2009  . Peripheral vascular disease ([place]) 01/14/2009  . CHRONIC OBSTRUCTIVE PULMONARY DISEASE 01/14/2009   Past Medical History:  Diagnosis Date  . Arthritis   . CAD (coronary artery disease)    a. 02/2007 s/p PCI/DES ot LCX and RCA;  b. 07/2014 MV: no ischemia/infarct, EF 68%.  . Carotid arterial disease ([place])    a. 05/2015 Carotid U/S: RICA 3-29%, LICA 51-88%, RECA 4-16%, LECA >50% - overall stable, f/u 1 yr.  . Complication of anesthesia    Three days after procedure pt statrted to suffer with memoy loss that took about 4-5 days to come back according to [name]."  . Dilated cardiomyopathy ([place])    a. 05/2005 Echo: EF 65%; b. 01/22/2016 Echo: EF 25-30% (in setting of admission for urosepsis w/ elev trop); c. 01/26/2016 Echo: EF 35-40%, inf, distal apical, apical, and mid to distal septal HK-->felt to be 2/2 stress induced CM.  [name] Heart murmur   . History of bronchitis   . Hyperlipidemia   . Hypertension   . Hypertensive heart disease   . Memory impairment   . MRSA (methicillin resistant staph aureus) culture positive   . Nephrolithiasis    a. 12/2015 UVJ stone w/ hydroureteronephrosis req nephrostomy tube placement.  . Osteoarthritis    a. 12/628 s/p L TKA complicated by infections req skin grafting.  [name] PVD (peripheral vascular disease) ([place])    a. Bilat common iliac dzs, nl ABI's --> med rx.  . Sepsis ([place])    a. 12/2015 admitted with Proteus mirabilis urosepsis/bacteremia.  . Wears glasses     Family History  Problem Relation Age of Onset  . Breast cancer Mother   . Leukemia Father   . Stroke Sister   . Bone cancer Other     Past Surgical History:  Procedure Laterality Date  . ABDOMINAL HYSTERECTOMY    . APPLICATION OF A-CELL OF EXTREMITY Left 12/08/2015   Procedure: APPLICATION OF A-CELL OF knee;   Surgeon: Loel Lofty [name], DO;  Location: [facility];  Service: Plastics;  Laterality: Left;  . APPLICATION OF WOUND VAC Left 11/16/2015   Procedure: APPLICATION OF WOUND VAC;  Surgeon: Loel Lofty [name], DO;  Location: [facility];  Service: Plastics;  Laterality: Left;  . APPLICATION OF WOUND VAC Left 12/08/2015   Procedure: APPLICATION OF WOUND VAC  AND CAST to knee;  Surgeon: Loel Lofty [name], DO;  Location: [facility];  Service: Plastics;  Laterality: Left;  . COLONOSCOPY    . CORONARY ANGIOPLASTY  07   3 stents placed  . ESOPHAGOGASTRODUODENOSCOPY    . EYE SURGERY Bilateral    cataracts  . FREE FLAP TO EXTREMITY Left 11/16/2015   Procedure: FREE FLAP TO EXTREMITY;  Surgeon: Loel Lofty  [name], DO;  Location: [facility];  Service: Plastics;  Laterality: Left;  . I & D EXTREMITY Left 10/18/2015   Procedure: Left Knee Washout, Reclosure;  Surgeon: Newt Minion, MD;  Location: [facility];  Service: Orthopedics;  Laterality: Left;  . I & D EXTREMITY Left 12/08/2015   Procedure: IRRIGATION AND DEBRIDEMENT knee;  Surgeon: Loel Lofty [name], DO;  Location: [facility];  Service: Plastics;  Laterality: Left;  . I & D KNEE WITH POLY EXCHANGE Left 10/12/2015   Procedure: Irrigation and Debridement , Poly Exchange, Antibiotic Beads Left Knee;  Surgeon: Newt Minion, MD;  Location: [facility];  Service: Orthopedics;  Laterality: Left;  . INCISION AND DRAINAGE OF WOUND Left 01/04/2016   Procedure: DEBRIDEMENT OF LEFT KNEE WOUND WITH ACELL AND WOUND VAC PLACEMENT;  Surgeon: Loel Lofty [name], DO;  Location: [facility];  Service: Plastics;  Laterality: Left;  . IR GENERIC HISTORICAL  05/11/2016   IR FLUORO GUIDE CV MIDLINE PICC RIGHT 05/11/2016 Sandi Mariscal, MD [facility]  . IR GENERIC HISTORICAL  05/11/2016   IR US GUIDE VASC ACCESS RIGHT 05/11/2016 Sandi Mariscal, MD [facility]  . KNEE CLOSED REDUCTION Left 01/16/2017   Procedure: CLOSED MANIPULATION LEFT KNEE;  Surgeon: Leandrew Koyanagi, MD;  Location: [facility];   Service: Orthopedics;  Laterality: Left;  . KNEE SURGERY     denies  . SKIN SPLIT GRAFT Left 11/16/2015   Procedure: LEFT GASTROC MUSCLE FLAP WITH SKIN GRAFT SPLIT THICKNESS AND VAC PLACEMENT;  Surgeon: Loel Lofty [name], DO;  Location: [facility];  Service: Plastics;  Laterality: Left;  . TOTAL HIP ARTHROPLASTY Right 09/10/2012   Dr Sharol Given  . TOTAL HIP ARTHROPLASTY Right 09/10/2012   Procedure: TOTAL HIP ARTHROPLASTY;  Surgeon: Newt Minion, MD;  Location: [facility];  Service: Orthopedics;  Laterality: Right;  Right Total Hip Arthroplasty  . TOTAL HIP ARTHROPLASTY Left 06/27/2016   Procedure: LEFT TOTAL HIP ARTHROPLASTY ANTERIOR APPROACH;  Surgeon: Leandrew Koyanagi, MD;  Location: [facility];  Service: Orthopedics;  Laterality: Left;  . TOTAL KNEE ARTHROPLASTY Left 09/28/2015   Procedure: TOTAL KNEE ARTHROPLASTY;  Surgeon: Newt Minion, MD;  Location: [facility];  Service: Orthopedics;  Laterality: Left;   Social History   Occupational History  . Occupation: Retired  Tobacco Use  . Smoking status: Former Smoker    Packs/day: 0.50    Years: 50.00    Pack years: 25.00    Types: Cigarettes    Quit date: 09/28/2015    Years since quitting: 4.1  . Smokeless tobacco: Never Used  . Tobacco comment: quit 09/27/15  Substance and Sexual Activity  . Alcohol use: No  . Drug use: No  . Sexual activity: Never

## 2019-11-26 ENCOUNTER — Other Ambulatory Visit: Payer: Self-pay

## 2019-11-26 ENCOUNTER — Other Ambulatory Visit: Payer: Self-pay | Admitting: Internal Medicine

## 2019-11-26 MED ORDER — DOXYCYCLINE HYCLATE 100 MG PO TABS: 100.0000 mg | ORAL_TABLET | Freq: Two times a day (BID) | ORAL | 6 refills | Status: DC

## 2019-11-26 MED ORDER — RIFAMPIN 300 MG PO CAPS: 300.0000 mg | ORAL_CAPSULE | Freq: Two times a day (BID) | ORAL | 6 refills | Status: DC

## 2019-12-18 ENCOUNTER — Other Ambulatory Visit: Payer: Self-pay | Admitting: Cardiology

## 2019-12-23 ENCOUNTER — Other Ambulatory Visit: Payer: Self-pay

## 2019-12-23 MED ORDER — AMLODIPINE BESYLATE 10 MG PO TABS: 10.0000 mg | ORAL_TABLET | Freq: Every day | ORAL | 3 refills | Status: DC

## 2020-01-03 ENCOUNTER — Other Ambulatory Visit: Payer: Self-pay | Admitting: Cardiology

## 2020-01-13 ENCOUNTER — Telehealth: Payer: Self-pay

## 2020-01-13 MED ORDER — TRAMADOL HCL 50 MG PO TABS: 50.0000 mg | ORAL_TABLET | Freq: Two times a day (BID) | ORAL | 0 refills | Status: DC | PRN

## 2020-01-13 NOTE — Telephone Encounter (Signed)
Will send it in.

## 2020-01-13 NOTE — Telephone Encounter (Signed)
Can you advise? She has been taking Tramadol

## 2020-01-13 NOTE — Addendum Note (Signed)
Addended by: Hortencia Pilar on: 01/13/2020 12:01 PM   Modules accepted: Orders

## 2020-01-13 NOTE — Telephone Encounter (Signed)
Patient would like to have a Rx for her right knee pain until her appointment on July 8th?  Cb# [phone].  Please advise.  Thank you.

## 2020-01-13 NOTE — Telephone Encounter (Signed)
Patient aware Rx was sent in for her

## 2020-02-04 ENCOUNTER — Other Ambulatory Visit: Payer: Self-pay

## 2020-02-04 ENCOUNTER — Encounter: Payer: Self-pay | Admitting: Orthopaedic Surgery

## 2020-02-04 ENCOUNTER — Ambulatory Visit (INDEPENDENT_AMBULATORY_CARE_PROVIDER_SITE_OTHER): Payer: Medicare Other

## 2020-02-04 ENCOUNTER — Ambulatory Visit (INDEPENDENT_AMBULATORY_CARE_PROVIDER_SITE_OTHER): Payer: Medicare Other | Admitting: Orthopaedic Surgery

## 2020-02-04 DIAGNOSIS — I2510 Atherosclerotic heart disease of native coronary artery without angina pectoris: Secondary | ICD-10-CM | POA: Diagnosis not present

## 2020-02-04 DIAGNOSIS — M1711 Unilateral primary osteoarthritis, right knee: Secondary | ICD-10-CM

## 2020-02-04 DIAGNOSIS — M25561 Pain in right knee: Secondary | ICD-10-CM

## 2020-02-04 DIAGNOSIS — G8929 Other chronic pain: Secondary | ICD-10-CM

## 2020-02-04 MED ORDER — METHYLPREDNISOLONE ACETATE 40 MG/ML IJ SUSP
40.0000 mg | INTRAMUSCULAR | Status: AC | PRN
  Administered 2020-02-04: 40 mg via INTRA_ARTICULAR

## 2020-02-04 MED ORDER — LIDOCAINE HCL 1 % IJ SOLN
3.0000 mL | INTRAMUSCULAR | Status: AC | PRN
  Administered 2020-02-04: 3 mL

## 2020-02-04 MED ORDER — TRAMADOL HCL 50 MG PO TABS: 50.0000 mg | ORAL_TABLET | Freq: Two times a day (BID) | ORAL | 0 refills | Status: DC | PRN

## 2020-02-04 NOTE — Progress Notes (Signed)
Office Visit Note   Patient: [name]           Date of Birth: August 14, 1940           MRN: [id number] Visit Date: 02/04/2020              Requested by: Darreld Mclean, MD [address] [place],   [id number] PCP: Darreld Mclean, MD   Assessment & Plan: Visit Diagnoses:  1. Chronic pain of right knee   2. Unilateral primary osteoarthritis, right knee     Plan: She did tolerate the steroid injection in her right knee and felt much better after the injection.  Both her and her daughter are wondering if a knee replacement is possible.  Given her chronic infection that is being suppressed in her left knee, there is concerned that a knee replacement done acutely on her right side would have a high incidence of getting infected.  She does see the infectious the specialist twice yearly and I want her to address that with them and I can even talk to them about it as well but I have to understand that that is very risky.  We will see her back in 3 months from now to see how she is doing overall and can consider repeat steroid injection.  I will send in some more tramadol for her as well.  All questions and concerns were answered and addressed.  Follow-Up Instructions: Return in about 3 months (around 05/06/2020).   Orders:  Orders Placed This Encounter  Procedures  . Large Joint Inj: R knee  . XR Knee 1-2 Views Right   Meds ordered this encounter  Medications  . traMADol (ULTRAM) 50 MG tablet    Sig: Take 1-2 tablets (50-100 mg total) by mouth every 12 (twelve) hours as needed.    Dispense:  30 tablet    Refill:  0      Procedures: Large Joint Inj: R knee on 02/04/2020 12:54 PM Indications: diagnostic evaluation and pain Details: 22 G 1.5 in needle, superolateral approach  Arthrogram: No  Medications: 3 mL lidocaine 1 %; 40 mg methylPREDNISolone acetate 40 MG/ML Outcome: tolerated well, no immediate complications Procedure, treatment alternatives,  risks and benefits explained, specific risks discussed. Consent was given by the patient. Immediately prior to procedure a time out was called to verify the correct patient, procedure, equipment, support staff and site/side marked as required. Patient was prepped and draped in the usual sterile fashion.       Clinical Data: No additional findings.   Subjective: Chief Complaint  Patient presents with  . Right Knee - Pain  The patient is a very pleasant 79 year old female who comes in with acute on chronic right knee pain.  She has had steroid injections in this knee before and has known osteoarthritis of the knee but things have gotten significantly worse over the last 2 weeks and she does not want put weight on it.  There is been no injury or fall that she is aware of.  She does have a history of both her hips being replaced.  One was done electively and the other one was after a hip fracture.  She is also had a left total knee arthroplasty.  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  She is on chronic antibiotics for suppression as relates to MRSA.  Again she reports more significant pain in her right knee as of late that  is limited her mobility and her actives daily living as well as her quality of life.  HPI  Review of Systems She currently denies any headache, chest pain, shortness of breath, fever, chills, nausea, vomiting  Objective: Vital Signs: There were no vitals taken for this visit.  Physical Exam She is alert and orient x3 and in no acute distress Ortho Exam Examination of her right knee shows some mild to moderate swelling.  There is pain with flexion and extension of that knee and I cannot fully extend the knee on the right side.  It is not warm and not red.  Her right hip moves smoothly. Specialty Comments:  No specialty comments available.  Imaging: XR Knee 1-2 Views Right  Result Date: 02/04/2020 2 views of the right knee shows severe end-stage arthritis  involving the entire knee joint.    PMFS History: Patient Active Problem List   Diagnosis Date Noted  . Syncope 04/01/2019  . Closed fracture of multiple pubic rami ([place]) 07/22/2018  . Closed fracture of right superior rim of pubis ([place])   . Lateral dislocation of left patella 01/15/2017  . Unilateral primary osteoarthritis, right knee 01/15/2017  . Osteoporosis 11/26/2016  . Acute pain of left knee 08/27/2016  . Chronic diarrhea 08/07/2016  . Left displaced femoral neck fracture ([place]) 06/26/2016  . MRSA (methicillin resistant Staphylococcus aureus) infection 06/26/2016  . Hypertensive heart disease   . Hydronephrosis   . Obstructive uropathy   . Abnormal echocardiogram 01/23/2016  . Pressure ulcer 01/21/2016  . Chronic infection of prosthetic knee ([place]) 10/12/2015  . AAA 01/19/2009  . ILIAC ARTERY ANEURYSM 01/19/2009  . Hyperlipidemia 01/14/2009  . TOBACCO ABUSE 01/14/2009  . Essential hypertension 01/14/2009  . Coronary atherosclerosis 01/14/2009  . Cerebrovascular disease 01/14/2009  . Peripheral vascular disease ([place]) 01/14/2009  . CHRONIC OBSTRUCTIVE PULMONARY DISEASE 01/14/2009   Past Medical History:  Diagnosis Date  . Arthritis   . CAD (coronary artery disease)    a. 02/2007 s/p PCI/DES ot LCX and RCA;  b. 07/2014 MV: no ischemia/infarct, EF 68%.  . Carotid arterial disease ([place])    a. 05/2015 Carotid U/S: RICA 6-01%, LICA 09-32%, RECA 3-55%, LECA >50% - overall stable, f/u 1 yr.  . Complication of anesthesia    Three days after procedure pt statrted to suffer with memoy loss that took about 4-5 days to come back according to [name]."  . Dilated cardiomyopathy ([place])    a. 05/2005 Echo: EF 65%; b. 01/22/2016 Echo: EF 25-30% (in setting of admission for urosepsis w/ elev trop); c. 01/26/2016 Echo: EF 35-40%, inf, distal apical, apical, and mid to distal septal HK-->felt to be 2/2 stress induced CM.  [name] Heart murmur   . History of bronchitis   . Hyperlipidemia   .  Hypertension   . Hypertensive heart disease   . Memory impairment   . MRSA (methicillin resistant staph aureus) culture positive   . Nephrolithiasis    a. 12/2015 UVJ stone w/ hydroureteronephrosis req nephrostomy tube placement.  . Osteoarthritis    a. 01/3219 s/p L TKA complicated by infections req skin grafting.  [name] PVD (peripheral vascular disease) ([place])    a. Bilat common iliac dzs, nl ABI's --> med rx.  . Sepsis ([place])    a. 12/2015 admitted with Proteus mirabilis urosepsis/bacteremia.  . Wears glasses     Family History  Problem Relation Age of Onset  . Breast cancer Mother   . Leukemia Father   . Stroke  Sister   . Bone cancer Other     Past Surgical History:  Procedure Laterality Date  . ABDOMINAL HYSTERECTOMY    . APPLICATION OF A-CELL OF EXTREMITY Left 12/08/2015   Procedure: APPLICATION OF A-CELL OF knee;  Surgeon: Loel Lofty [name], DO;  Location: [facility];  Service: Plastics;  Laterality: Left;  . APPLICATION OF WOUND VAC Left 11/16/2015   Procedure: APPLICATION OF WOUND VAC;  Surgeon: Loel Lofty [name], DO;  Location: [facility];  Service: Plastics;  Laterality: Left;  . APPLICATION OF WOUND VAC Left 12/08/2015   Procedure: APPLICATION OF WOUND VAC  AND CAST to knee;  Surgeon: Loel Lofty [name], DO;  Location: [facility];  Service: Plastics;  Laterality: Left;  . COLONOSCOPY    . CORONARY ANGIOPLASTY  07   3 stents placed  . ESOPHAGOGASTRODUODENOSCOPY    . EYE SURGERY Bilateral    cataracts  . FREE FLAP TO EXTREMITY Left 11/16/2015   Procedure: FREE FLAP TO EXTREMITY;  Surgeon: Loel Lofty [name], DO;  Location: [facility];  Service: Plastics;  Laterality: Left;  . I & D EXTREMITY Left 10/18/2015   Procedure: Left Knee Washout, Reclosure;  Surgeon: Newt Minion, MD;  Location: [facility];  Service: Orthopedics;  Laterality: Left;  . I & D EXTREMITY Left 12/08/2015   Procedure: IRRIGATION AND DEBRIDEMENT knee;  Surgeon: Loel Lofty [name], DO;  Location: [facility];  Service: Plastics;   Laterality: Left;  . I & D KNEE WITH POLY EXCHANGE Left 10/12/2015   Procedure: Irrigation and Debridement , Poly Exchange, Antibiotic Beads Left Knee;  Surgeon: Newt Minion, MD;  Location: [facility];  Service: Orthopedics;  Laterality: Left;  . INCISION AND DRAINAGE OF WOUND Left 01/04/2016   Procedure: DEBRIDEMENT OF LEFT KNEE WOUND WITH ACELL AND WOUND VAC PLACEMENT;  Surgeon: Loel Lofty [name], DO;  Location: [facility];  Service: Plastics;  Laterality: Left;  . IR GENERIC HISTORICAL  05/11/2016   IR FLUORO GUIDE CV MIDLINE PICC RIGHT 05/11/2016 Sandi Mariscal, MD [facility]  . IR GENERIC HISTORICAL  05/11/2016   IR US GUIDE VASC ACCESS RIGHT 05/11/2016 Sandi Mariscal, MD [facility]  . KNEE CLOSED REDUCTION Left 01/16/2017   Procedure: CLOSED MANIPULATION LEFT KNEE;  Surgeon: Leandrew Koyanagi, MD;  Location: [facility];  Service: Orthopedics;  Laterality: Left;  . KNEE SURGERY     denies  . SKIN SPLIT GRAFT Left 11/16/2015   Procedure: LEFT GASTROC MUSCLE FLAP WITH SKIN GRAFT SPLIT THICKNESS AND VAC PLACEMENT;  Surgeon: Loel Lofty [name], DO;  Location: [facility];  Service: Plastics;  Laterality: Left;  . TOTAL HIP ARTHROPLASTY Right 09/10/2012   Dr Sharol Given  . TOTAL HIP ARTHROPLASTY Right 09/10/2012   Procedure: TOTAL HIP ARTHROPLASTY;  Surgeon: Newt Minion, MD;  Location: [facility];  Service: Orthopedics;  Laterality: Right;  Right Total Hip Arthroplasty  . TOTAL HIP ARTHROPLASTY Left 06/27/2016   Procedure: LEFT TOTAL HIP ARTHROPLASTY ANTERIOR APPROACH;  Surgeon: Leandrew Koyanagi, MD;  Location: [facility];  Service: Orthopedics;  Laterality: Left;  . TOTAL KNEE ARTHROPLASTY Left 09/28/2015   Procedure: TOTAL KNEE ARTHROPLASTY;  Surgeon: Newt Minion, MD;  Location: [facility];  Service: Orthopedics;  Laterality: Left;   Social History   Occupational History  . Occupation: Retired  Tobacco Use  . Smoking status: Former Smoker    Packs/day: 0.50    Years: 50.00    Pack years: 25.00    Types:  Cigarettes  Quit date: 09/28/2015    Years since quitting: 4.3  . Smokeless tobacco: Never Used  . Tobacco comment: quit 09/27/15  Substance and Sexual Activity  . Alcohol use: No  . Drug use: No  . Sexual activity: Never

## 2020-03-21 NOTE — Progress Notes (Signed)
HPI: FU coronary artery disease. She is status post PCI of her circumflex and right coronary artery with drug-eluting stents in August 2008. Abdominal ultrasound in January 2014 showed moderate right and severe left common iliac stenosis. ABIs in January of 2014 were normal. Patient seen by Dr Fletcher Anon and medical therapy recommended for now.Nuclear study 1/16 showed EF 68 and normal perfusion. Pt had probable stress induced CM 6/17 in setting of urosepsis.Also with GI bleed 7/17 related to gastic AVM; ablated.Patient had syncope September 2020 felt secondary to dehydration/orthostasis from chronic diarrhea.  Carotid Doppler September 2020 showed 1 to 39% bilateral stenosis.  There was note of right subclavian stenosis.  Echocardiogram December 2020 showed normal LV systolic function, grade 1 diastolic dysfunction, mild left atrial enlargement, mild aortic stenosis with mean gradient 12 mmHg and mild aortic insufficiency.  Since I last saw her,  she occasionally has dyspnea. She denies dyspnea, palpitations or syncope. No pedal edema.  Current Outpatient Medications  Medication Sig Dispense Refill  . amLODipine (NORVASC) 10 MG tablet Take 1 tablet (10 mg total) by mouth daily. 90 tablet 3  . aspirin EC 81 MG tablet Take 81 mg by mouth daily.    [name] atorvastatin (LIPITOR) 40 MG tablet Take 1 tablet (40 mg total) by mouth daily. 90 tablet 3  . Calcium Carbonate-Vit D-Min (CALCIUM 1200 PO) Take 1 tablet by mouth daily.     . carvedilol (COREG) 25 MG tablet Take 1 tablet (25 mg total) by mouth 2 (two) times daily with a meal. 180 tablet 3  . diclofenac sodium (VOLTAREN) 1 % GEL Apply 2 g topically 4 (four) times daily. Use as needed for joint pain.  Max 32g/day total 100 g 3  . doxycycline (VIBRA-TABS) 100 MG tablet Take 1 tablet (100 mg total) by mouth 2 (two) times daily. 60 tablet 6  . furosemide (LASIX) 20 MG tablet Take 1 tablet (20 mg total) by mouth every other day. 45 tablet 3  . hydrALAZINE  (APRESOLINE) 25 MG tablet Take 1 tablet (25 mg total) by mouth every 8 (eight) hours. 270 tablet 3  . Magnesium Oxide (MAG-OXIDE) 200 MG TABS Take 200 mg by mouth every morning.     . Multiple Vitamin (MULITIVITAMIN WITH MINERALS) TABS Take 1 tablet by mouth daily.    . potassium chloride SA (KLOR-CON M20) 20 MEQ tablet Take 1 tablet (20 mEq total) by mouth daily. 90 tablet 3  . Probiotic Product (PROBIOTIC DAILY PO) Take 1 capsule by mouth daily.    . rifampin (RIFADIN) 300 MG capsule Take 1 capsule (300 mg total) by mouth 2 (two) times daily. 60 capsule 6  . traMADol (ULTRAM) 50 MG tablet Take 1-2 tablets (50-100 mg total) by mouth every 12 (twelve) hours as needed. 30 tablet 0  . lisinopril (ZESTRIL) 40 MG tablet Take 1 tablet (40 mg total) by mouth daily. 90 tablet 3  . mesalamine (LIALDA) 1.2 g EC tablet Take 2.4 g by mouth daily with breakfast.     No current facility-administered medications for this visit.     Past Medical History:  Diagnosis Date  . Arthritis   . CAD (coronary artery disease)    a. 02/2007 s/p PCI/DES ot LCX and RCA;  b. 07/2014 MV: no ischemia/infarct, EF 68%.  . Carotid arterial disease ([place])    a. 05/2015 Carotid U/S: RICA 3-66%, LICA 44-03%, RECA 4-74%, LECA >50% - overall stable, f/u 1 yr.  . Complication of anesthesia  Three days after procedure pt statrted to suffer with memoy loss that took about 4-5 days to come back according to [name]."  . Dilated cardiomyopathy ([place])    a. 05/2005 Echo: EF 65%; b. 01/22/2016 Echo: EF 25-30% (in setting of admission for urosepsis w/ elev trop); c. 01/26/2016 Echo: EF 35-40%, inf, distal apical, apical, and mid to distal septal HK-->felt to be 2/2 stress induced CM.  [name] Heart murmur   . History of bronchitis   . Hyperlipidemia   . Hypertension   . Hypertensive heart disease   . Memory impairment   . MRSA (methicillin resistant staph aureus) culture positive   . Nephrolithiasis    a. 12/2015 UVJ stone w/  hydroureteronephrosis req nephrostomy tube placement.  . Osteoarthritis    a. 02/2422 s/p L TKA complicated by infections req skin grafting.  [name] PVD (peripheral vascular disease) ([place])    a. Bilat common iliac dzs, nl ABI's --> med rx.  . Sepsis ([place])    a. 12/2015 admitted with Proteus mirabilis urosepsis/bacteremia.  . Wears glasses     Past Surgical History:  Procedure Laterality Date  . ABDOMINAL HYSTERECTOMY    . APPLICATION OF A-CELL OF EXTREMITY Left 12/08/2015   Procedure: APPLICATION OF A-CELL OF knee;  Surgeon: Loel Lofty [name], DO;  Location: [facility];  Service: Plastics;  Laterality: Left;  . APPLICATION OF WOUND VAC Left 11/16/2015   Procedure: APPLICATION OF WOUND VAC;  Surgeon: Loel Lofty [name], DO;  Location: [facility];  Service: Plastics;  Laterality: Left;  . APPLICATION OF WOUND VAC Left 12/08/2015   Procedure: APPLICATION OF WOUND VAC  AND CAST to knee;  Surgeon: Loel Lofty [name], DO;  Location: [facility];  Service: Plastics;  Laterality: Left;  . COLONOSCOPY    . CORONARY ANGIOPLASTY  07   3 stents placed  . ESOPHAGOGASTRODUODENOSCOPY    . EYE SURGERY Bilateral    cataracts  . FREE FLAP TO EXTREMITY Left 11/16/2015   Procedure: FREE FLAP TO EXTREMITY;  Surgeon: Loel Lofty [name], DO;  Location: [facility];  Service: Plastics;  Laterality: Left;  . I & D EXTREMITY Left 10/18/2015   Procedure: Left Knee Washout, Reclosure;  Surgeon: Newt Minion, MD;  Location: [facility];  Service: Orthopedics;  Laterality: Left;  . I & D EXTREMITY Left 12/08/2015   Procedure: IRRIGATION AND DEBRIDEMENT knee;  Surgeon: Loel Lofty [name], DO;  Location: [facility];  Service: Plastics;  Laterality: Left;  . I & D KNEE WITH POLY EXCHANGE Left 10/12/2015   Procedure: Irrigation and Debridement , Poly Exchange, Antibiotic Beads Left Knee;  Surgeon: Newt Minion, MD;  Location: [facility];  Service: Orthopedics;  Laterality: Left;  . INCISION AND DRAINAGE OF WOUND Left 01/04/2016   Procedure: DEBRIDEMENT OF LEFT  KNEE WOUND WITH ACELL AND WOUND VAC PLACEMENT;  Surgeon: Loel Lofty [name], DO;  Location: [facility];  Service: Plastics;  Laterality: Left;  . IR GENERIC HISTORICAL  05/11/2016   IR FLUORO GUIDE CV MIDLINE PICC RIGHT 05/11/2016 Sandi Mariscal, MD [facility]  . IR GENERIC HISTORICAL  05/11/2016   IR US GUIDE VASC ACCESS RIGHT 05/11/2016 Sandi Mariscal, MD [facility]  . KNEE CLOSED REDUCTION Left 01/16/2017   Procedure: CLOSED MANIPULATION LEFT KNEE;  Surgeon: Leandrew Koyanagi, MD;  Location: [facility];  Service: Orthopedics;  Laterality: Left;  . KNEE SURGERY     denies  . SKIN SPLIT GRAFT Left 11/16/2015   Procedure: LEFT GASTROC  MUSCLE FLAP WITH SKIN GRAFT SPLIT THICKNESS AND VAC PLACEMENT;  Surgeon: Loel Lofty [name], DO;  Location: [facility];  Service: Plastics;  Laterality: Left;  . TOTAL HIP ARTHROPLASTY Right 09/10/2012   Dr Sharol Given  . TOTAL HIP ARTHROPLASTY Right 09/10/2012   Procedure: TOTAL HIP ARTHROPLASTY;  Surgeon: Newt Minion, MD;  Location: [facility];  Service: Orthopedics;  Laterality: Right;  Right Total Hip Arthroplasty  . TOTAL HIP ARTHROPLASTY Left 06/27/2016   Procedure: LEFT TOTAL HIP ARTHROPLASTY ANTERIOR APPROACH;  Surgeon: Leandrew Koyanagi, MD;  Location: [facility];  Service: Orthopedics;  Laterality: Left;  . TOTAL KNEE ARTHROPLASTY Left 09/28/2015   Procedure: TOTAL KNEE ARTHROPLASTY;  Surgeon: Newt Minion, MD;  Location: [facility];  Service: Orthopedics;  Laterality: Left;    Social History   Socioeconomic History  . Marital status: Single    Spouse name: Not on file  . Number of children: Not on file  . Years of education: Not on file  . Highest education level: Not on file  Occupational History  . Occupation: Retired  Tobacco Use  . Smoking status: Former Smoker    Packs/day: 0.50    Years: 50.00    Pack years: 25.00    Types: Cigarettes    Quit date: 09/28/2015    Years since quitting: 4.5  . Smokeless tobacco: Never Used  . Tobacco comment: quit 09/27/15    Substance and Sexual Activity  . Alcohol use: No  . Drug use: No  . Sexual activity: Never  Other Topics Concern  . Not on file  Social History Narrative   She has been living with her daughter when not in rehab since the surgery in March.   Social Determinants of Health   Financial Resource Strain:   . Difficulty of Paying Living Expenses: Not on file  Food Insecurity:   . Worried About Charity fundraiser in the Last Year: Not on file  . Ran Out of Food in the Last Year: Not on file  Transportation Needs:   . Lack of Transportation (Medical): Not on file  . Lack of Transportation (Non-Medical): Not on file  Physical Activity:   . Days of Exercise per Week: Not on file  . Minutes of Exercise per Session: Not on file  Stress:   . Feeling of Stress : Not on file  Social Connections:   . Frequency of Communication with Friends and Family: Not on file  . Frequency of Social Gatherings with Friends and Family: Not on file  . Attends Religious Services: Not on file  . Active Member of Clubs or Organizations: Not on file  . Attends Archivist Meetings: Not on file  . Marital Status: Not on file  Intimate Partner Violence:   . Fear of Current or Ex-Partner: Not on file  . Emotionally Abused: Not on file  . Physically Abused: Not on file  . Sexually Abused: Not on file    Family History  Problem Relation Age of Onset  . Breast cancer Mother   . Leukemia Father   . Stroke Sister   . Bone cancer Other     ROS: Bilateral knee pain but no fevers or chills, productive cough, hemoptysis, dysphasia, odynophagia, melena, hematochezia, dysuria, hematuria, rash, seizure activity, orthopnea, PND, pedal edema, claudication. Remaining systems are negative.  Physical Exam: Well-developed frail in no acute distress.  Skin is warm and dry.  HEENT is normal.  Neck is supple.  Chest is clear to  auscultation with normal expansion.  Cardiovascular exam is regular rate and  rhythm. 1/6 systolic murmur Abdominal exam nontender or distended. No masses palpated. Extremities show no edema. neuro grossly intact  ECG-normal sinus rhythm at a rate of 67, first-degree AV block.  Personally reviewed  A/P  1 coronary artery disease-patient doing well with no chest pain.  Continue aspirin and statin.  2 hypertension-patient's blood pressure is elevated. I have asked her to track this and we will increase medications as needed.  3 hyperlipidemia-continue statin.  4 aortic stenosis/aortic insufficiency-plan repeat echocardiogram December 2021.  5 history of stress-induced cardiomyopathy-LV function as improved on most recent echocardiogram.  6 carotid artery disease-mild on most recent Dopplers.  7 peripheral vascular disease-no claudication.  Continue aspirin and statin.  8 preoperative evaluation prior to right knee replacement-she has limited functional capacity due to knee pain. We would therefore need to proceed with [place] nuclear study for risk stratification preoperatively if she plans to proceed. She will contact us with that decision.  Kirk Ruths, MD

## 2020-03-28 ENCOUNTER — Ambulatory Visit (INDEPENDENT_AMBULATORY_CARE_PROVIDER_SITE_OTHER): Payer: Medicare Other | Admitting: Cardiology

## 2020-03-28 ENCOUNTER — Encounter: Payer: Self-pay | Admitting: Cardiology

## 2020-03-28 ENCOUNTER — Other Ambulatory Visit: Payer: Self-pay

## 2020-03-28 VITALS — BP 152/88 | HR 67 | Ht 62.0 in | Wt 116.6 lb

## 2020-03-28 DIAGNOSIS — I2510 Atherosclerotic heart disease of native coronary artery without angina pectoris: Secondary | ICD-10-CM | POA: Diagnosis not present

## 2020-03-28 DIAGNOSIS — I10 Essential (primary) hypertension: Secondary | ICD-10-CM

## 2020-03-28 DIAGNOSIS — E7800 Pure hypercholesterolemia, unspecified: Secondary | ICD-10-CM | POA: Diagnosis not present

## 2020-03-28 DIAGNOSIS — I350 Nonrheumatic aortic (valve) stenosis: Secondary | ICD-10-CM | POA: Diagnosis not present

## 2020-03-28 NOTE — Patient Instructions (Addendum)
Medication Instructions:  NO CHANGE *If you need a refill on your cardiac medications before your next appointment, please call your pharmacy*   Lab Work: If you have labs (blood work) drawn today and your tests are completely normal, you will receive your results only by:  [place] (if you have MyChart) OR  A paper copy in the mail If you have any lab test that is abnormal or we need to change your treatment, we will call you to review the results  Testing:  Your physician has requested that you have an echocardiogram. Echocardiography is a painless test that uses sound waves to create images of your heart. It provides your doctor with information about the size and shape of your heart and how well your hearts chambers and valves are working. This procedure takes approximately one hour. There are no restrictions for this procedure.[place]    Follow-Up: At [facility], you and your health needs are our priority.  As part of our continuing mission to provide you with exceptional heart care, we have created designated Provider Care Teams.  These Care Teams include your primary Cardiologist (physician) and Advanced Practice Providers (APPs -  Physician Assistants and Nurse Practitioners) who all work together to provide you with the care you need, when you need it.  We recommend signing up for the patient portal called "MyChart".  Sign up information is provided on this After Visit Summary.  MyChart is used to connect with patients for Virtual Visits (Telemedicine).  Patients are able to view lab/test results, encounter notes, upcoming appointments, etc.  Non-urgent messages can be sent to your provider as well.   To learn more about what you can do with MyChart, go to [email or web address].    Your next appointment:   12 month(s)  The format for your next appointment:   In Person  Provider:   You may see Kirk Ruths MD or one of the  following Advanced Practice Providers on your designated Care Team:    Kerin Ransom, PA-C  [place], [place]  Coletta Memos, [place]

## 2020-04-20 ENCOUNTER — Telehealth: Payer: Self-pay

## 2020-04-20 DIAGNOSIS — G8929 Other chronic pain: Secondary | ICD-10-CM

## 2020-04-20 MED ORDER — TRAMADOL HCL 50 MG PO TABS: 50.0000 mg | ORAL_TABLET | Freq: Two times a day (BID) | ORAL | 0 refills | Status: DC | PRN

## 2020-04-20 NOTE — Telephone Encounter (Signed)
Please advise 

## 2020-04-20 NOTE — Telephone Encounter (Signed)
Patient called in wanting to get refill  On tramadol

## 2020-04-27 ENCOUNTER — Other Ambulatory Visit: Payer: Self-pay | Admitting: Family Medicine

## 2020-04-27 DIAGNOSIS — I10 Essential (primary) hypertension: Secondary | ICD-10-CM

## 2020-05-05 ENCOUNTER — Encounter: Payer: Self-pay | Admitting: Orthopaedic Surgery

## 2020-05-05 ENCOUNTER — Ambulatory Visit (INDEPENDENT_AMBULATORY_CARE_PROVIDER_SITE_OTHER): Payer: Medicare Other | Admitting: Orthopaedic Surgery

## 2020-05-05 DIAGNOSIS — M1711 Unilateral primary osteoarthritis, right knee: Secondary | ICD-10-CM

## 2020-05-05 DIAGNOSIS — M25561 Pain in right knee: Secondary | ICD-10-CM | POA: Diagnosis not present

## 2020-05-05 DIAGNOSIS — G8929 Other chronic pain: Secondary | ICD-10-CM | POA: Diagnosis not present

## 2020-05-05 DIAGNOSIS — I2510 Atherosclerotic heart disease of native coronary artery without angina pectoris: Secondary | ICD-10-CM

## 2020-05-05 MED ORDER — METHYLPREDNISOLONE ACETATE 40 MG/ML IJ SUSP
40.0000 mg | INTRAMUSCULAR | Status: AC | PRN
  Administered 2020-05-05: 40 mg via INTRA_ARTICULAR

## 2020-05-05 MED ORDER — LIDOCAINE HCL 1 % IJ SOLN
3.0000 mL | INTRAMUSCULAR | Status: AC | PRN
  Administered 2020-05-05: 3 mL

## 2020-05-05 NOTE — Progress Notes (Signed)
Office Visit Note   Patient: [name]           Date of Birth: Feb 24, 1941           MRN: [id number] Visit Date: 05/05/2020              Requested by: Courtney Mclean, MD [address] [place],   [id number] PCP: Courtney Mclean, MD   Assessment & Plan: Visit Diagnoses:  1. Chronic pain of right knee   2. Unilateral primary osteoarthritis, right knee     Plan: We will work on getting her on the OR schedule sometime in 4 to 6 weeks from now.  By then she will have had cardiac clearance for the surgery.  Having had knee replacement surgery before on her left side she is fully aware of the risk and benefits of this type of surgery.  At this point this is certainly her quality of life and pain issue thus she and her son would like [place] to proceed with the surgery.  We will be in touch about getting her scheduled.  I did feel comfortable with placing a steroid injection in her right knee today since it may be at least 4 weeks before potentially scheduling surgery.  Follow-Up Instructions: Return for 2 weeks post-op.   Orders:  No orders of the defined types were placed in this encounter.  No orders of the defined types were placed in this encounter.     Procedures: Large Joint Inj: R knee on 05/05/2020 2:24 PM Indications: diagnostic evaluation and pain Details: 22 G 1.5 in needle, superolateral approach  Arthrogram: No  Medications: 3 mL lidocaine 1 %; 40 mg methylPREDNISolone acetate 40 MG/ML Outcome: tolerated well, no immediate complications Procedure, treatment alternatives, risks and benefits explained, specific risks discussed. Consent was given by the patient. Immediately prior to procedure a time out was called to verify the correct patient, procedure, equipment, support staff and site/side marked as required. Patient was prepped and draped in the usual sterile fashion.       Clinical Data: No additional findings.   Subjective: Chief  Complaint  Patient presents with   Right Knee - Follow-up  The patient is well-known to me.  She has debilitating arthritis in her right knee.  At this point her pain is severe and it is 10 out of 10.  It is definitely affecting her mobility, her quality of life and her actives daily living.  We have tried multiple interventions in that knee with steroid injections and hyaluronic acid injections.  At this point these things are not working for her at all.  She is interested in a right total knee arthroplasty.  She does have a cardiac stress test coming up and is awaiting cardiac clearance for the surgery.  I feel comfortable with getting her on the schedule due to the delay in surgery that we have had to do due to the COVID-19 pandemic and we are certainly have a backlog of cases.  However, I do feel confident we be able to get her on some time in the next 4 to 6 weeks or so.  HPI  Review of Systems She currently denies any headache, chest pain, shortness of breath, fever, chills, nausea, vomiting  Objective: Vital Signs: There were no vitals taken for this visit.  Physical Exam She is alert and orient x3 and in no acute distress Ortho Exam Examination of her right knee shows severe deformities  with valgus malalignment and severe pain with any attempts of range of motion the knee.  There is a moderate joint effusion as well. Specialty Comments:  No specialty comments available.  Imaging: No results found. Previous x-rays of the right knee show severe end-stage arthritis in all three compartments with periarticular osteophytes and significant narrowing and loss of joint space.  PMFS History: Patient Active Problem List   Diagnosis Date Noted   Syncope 04/01/2019   Closed fracture of multiple pubic rami ([name]) 07/22/2018   Closed fracture of right superior rim of pubis ([place])    Lateral dislocation of left patella 01/15/2017   Unilateral primary osteoarthritis, right knee 01/15/2017     Osteoporosis 11/26/2016   Acute pain of left knee 08/27/2016   Chronic diarrhea 08/07/2016   Left displaced femoral neck fracture ([place]) 06/26/2016   MRSA (methicillin resistant Staphylococcus aureus) infection 06/26/2016   Hypertensive heart disease    Hydronephrosis    Obstructive uropathy    Abnormal echocardiogram 01/23/2016   Pressure ulcer 01/21/2016   Chronic infection of prosthetic knee ([place]) 10/12/2015   AAA 01/19/2009   ILIAC ARTERY ANEURYSM 01/19/2009   Hyperlipidemia 01/14/2009   TOBACCO ABUSE 01/14/2009   Essential hypertension 01/14/2009   Coronary atherosclerosis 01/14/2009   Cerebrovascular disease 01/14/2009   Peripheral vascular disease ([place]) 01/14/2009   CHRONIC OBSTRUCTIVE PULMONARY DISEASE 01/14/2009   Past Medical History:  Diagnosis Date   Arthritis    CAD (coronary artery disease)    a. 02/2007 s/p PCI/DES ot LCX and RCA;  b. 07/2014 MV: no ischemia/infarct, EF 68%.   Carotid arterial disease ([place])    a. 05/2015 Carotid U/S: RICA 2-95%, LICA 18-84%, RECA 1-66%, LECA >50% - overall stable, f/u 1 yr.   Complication of anesthesia    Three days after procedure pt statrted to suffer with memoy loss that took about 4-5 days to come back according to pt son [name]."   Dilated cardiomyopathy ([place])    a. 05/2005 Echo: EF 65%; b. 01/22/2016 Echo: EF 25-30% (in setting of admission for urosepsis w/ elev trop); c. 01/26/2016 Echo: EF 35-40%, inf, distal apical, apical, and mid to distal septal HK-->felt to be 2/2 stress induced CM.   Heart murmur    History of bronchitis    Hyperlipidemia    Hypertension    Hypertensive heart disease    Memory impairment    MRSA (methicillin resistant staph aureus) culture positive    Nephrolithiasis    a. 12/2015 UVJ stone w/ hydroureteronephrosis req nephrostomy tube placement.   Osteoarthritis    a. 0/6301 s/p L TKA complicated by infections req skin grafting.   PVD (peripheral vascular disease)  ([place])    a. Bilat common iliac dzs, nl ABI's --> med rx.   Sepsis ([place])    a. 12/2015 admitted with Proteus mirabilis urosepsis/bacteremia.   Wears glasses     Family History  Problem Relation Age of Onset   Breast cancer Mother    Leukemia Father    Stroke Sister    Bone cancer Other     Past Surgical History:  Procedure Laterality Date   ABDOMINAL HYSTERECTOMY     APPLICATION OF A-CELL OF EXTREMITY Left 12/08/2015   Procedure: APPLICATION OF A-CELL OF knee;  Surgeon: Loel Lofty [name], DO;  Location: [place];  Service: Plastics;  Laterality: Left;   APPLICATION OF WOUND VAC Left 11/16/2015   Procedure: APPLICATION OF WOUND VAC;  Surgeon: Loel Lofty [name], DO;  Location: [place];  Service:  Plastics;  Laterality: Left;   APPLICATION OF WOUND VAC Left 12/08/2015   Procedure: APPLICATION OF WOUND VAC  AND CAST to knee;  Surgeon: Loel Lofty [name], DO;  Location: [facility];  Service: Plastics;  Laterality: Left;   COLONOSCOPY     CORONARY ANGIOPLASTY  07   3 stents placed   ESOPHAGOGASTRODUODENOSCOPY     EYE SURGERY Bilateral    cataracts   FREE FLAP TO EXTREMITY Left 11/16/2015   Procedure: FREE FLAP TO EXTREMITY;  Surgeon: Loel Lofty [name], DO;  Location: [place];  Service: Plastics;  Laterality: Left;   I & D EXTREMITY Left 10/18/2015   Procedure: Left Knee Washout, Reclosure;  Surgeon: Newt Minion, MD;  Location: [facility];  Service: Orthopedics;  Laterality: Left;   I & D EXTREMITY Left 12/08/2015   Procedure: IRRIGATION AND DEBRIDEMENT knee;  Surgeon: Loel Lofty [name], DO;  Location: [facility];  Service: Plastics;  Laterality: Left;   I & D KNEE WITH POLY EXCHANGE Left 10/12/2015   Procedure: Irrigation and Debridement , Poly Exchange, Antibiotic Beads Left Knee;  Surgeon: Newt Minion, MD;  Location: [facility];  Service: Orthopedics;  Laterality: Left;   INCISION AND DRAINAGE OF WOUND Left 01/04/2016   Procedure: DEBRIDEMENT OF LEFT KNEE WOUND WITH ACELL AND WOUND VAC  PLACEMENT;  Surgeon: Loel Lofty [name], DO;  Location: [place];  Service: Plastics;  Laterality: Left;   IR GENERIC HISTORICAL  05/11/2016   IR FLUORO GUIDE CV MIDLINE PICC RIGHT 05/11/2016 Sandi Mariscal, MD [facility]   IR GENERIC HISTORICAL  05/11/2016   IR US GUIDE VASC ACCESS RIGHT 05/11/2016 Sandi Mariscal, MD [facility]   KNEE CLOSED REDUCTION Left 01/16/2017   Procedure: CLOSED MANIPULATION LEFT KNEE;  Surgeon: Leandrew Koyanagi, MD;  Location: [facility];  Service: Orthopedics;  Laterality: Left;   KNEE SURGERY     denies   SKIN SPLIT GRAFT Left 11/16/2015   Procedure: LEFT GASTROC MUSCLE FLAP WITH SKIN GRAFT SPLIT THICKNESS AND VAC PLACEMENT;  Surgeon: Loel Lofty [name], DO;  Location: [facility];  Service: Plastics;  Laterality: Left;   TOTAL HIP ARTHROPLASTY Right 09/10/2012   Dr Sharol Given   TOTAL HIP ARTHROPLASTY Right 09/10/2012   Procedure: TOTAL HIP ARTHROPLASTY;  Surgeon: Newt Minion, MD;  Location: [facility];  Service: Orthopedics;  Laterality: Right;  Right Total Hip Arthroplasty   TOTAL HIP ARTHROPLASTY Left 06/27/2016   Procedure: LEFT TOTAL HIP ARTHROPLASTY ANTERIOR APPROACH;  Surgeon: Leandrew Koyanagi, MD;  Location: [facility];  Service: Orthopedics;  Laterality: Left;   TOTAL KNEE ARTHROPLASTY Left 09/28/2015   Procedure: TOTAL KNEE ARTHROPLASTY;  Surgeon: Newt Minion, MD;  Location: [facility];  Service: Orthopedics;  Laterality: Left;   Social History   Occupational History   Occupation: Retired  Tobacco Use   Smoking status: Former Smoker    Packs/day: 0.50    Years: 50.00    Pack years: 25.00    Types: Cigarettes    Quit date: 09/28/2015    Years since quitting: 4.6   Smokeless tobacco: Never Used   Tobacco comment: quit 09/27/15  Substance and Sexual Activity   Alcohol use: No   Drug use: No   Sexual activity: Never

## 2020-05-06 ENCOUNTER — Telehealth: Payer: Self-pay | Admitting: Cardiology

## 2020-05-06 NOTE — Telephone Encounter (Signed)
Pt was just seen yesterday Will await form from surgeons office to start pre op note .Pt aware .cy

## 2020-05-06 NOTE — Telephone Encounter (Signed)
Patient said she needs to have a stress test done before she can have knee surgery. There are no orders in the patient's chart. Please confirm with Dr. [name] what stress test the patient needs.   She is scheduled for an Echo on 10/28 and would like to have it the same day if possible.

## 2020-05-14 NOTE — Progress Notes (Deleted)
[facility] [address], [place], [place] [address] [phone] [id number]  Date:  05/16/2020   Name:  [name]   DOB:  November 12, 1940   MRN:  [id number]  PCP:  Darreld Mclean, MD    Chief Complaint: No chief complaint on file.   History of Present Illness:  [name] is a 79 y.o. very pleasant female patient who presents with the following:  Here today for medication follow-up. She has history of CAD status post stents x3, hyperlipidemia, hypertension.  In 2017 she had a left total knee, complicated by MRSA infection.  She underwent several follow-up procedures and had an episode of sepsis due to obstructive kidney stone.  Hospitalized last year for about 1 week with syncope Last seen by myself about 1 year ago She is actually plan to have a right knee replacement relatively soon per Dr. Ninfa Linden  COVID-19 Pneumonia vaccine series is likely complete Flu Shingrix CMP, lipids done in February  Amlodipine Aspirin Lipitor Carvedilol Lasix Hydralazine 3 times daily Lisinopril 40 Mesalamine ?  If she still on chronic antibiotics for suppression of infection in left knee  Her infectious disease doc is Dr. Megan Salon Patient Active Problem List   Diagnosis Date Noted  . Syncope 04/01/2019  . Closed fracture of multiple pubic rami ([place]) 07/22/2018  . Closed fracture of right superior rim of pubis ([place])   . Lateral dislocation of left patella 01/15/2017  . Unilateral primary osteoarthritis, right knee 01/15/2017  . Osteoporosis 11/26/2016  . Acute pain of left knee 08/27/2016  . Chronic diarrhea 08/07/2016  . Left displaced femoral neck fracture ([place]) 06/26/2016  . MRSA (methicillin resistant Staphylococcus aureus) infection 06/26/2016  . Hypertensive heart disease   . Hydronephrosis   . Obstructive uropathy   . Abnormal echocardiogram 01/23/2016  . Pressure ulcer 01/21/2016  . Chronic infection of  prosthetic knee ([place]) 10/12/2015  . AAA 01/19/2009  . ILIAC ARTERY ANEURYSM 01/19/2009  . Hyperlipidemia 01/14/2009  . TOBACCO ABUSE 01/14/2009  . Essential hypertension 01/14/2009  . Coronary atherosclerosis 01/14/2009  . Cerebrovascular disease 01/14/2009  . Peripheral vascular disease ([place]) 01/14/2009  . CHRONIC OBSTRUCTIVE PULMONARY DISEASE 01/14/2009    Past Medical History:  Diagnosis Date  . Arthritis   . CAD (coronary artery disease)    a. 02/2007 s/p PCI/DES ot LCX and RCA;  b. 07/2014 MV: no ischemia/infarct, EF 68%.  . Carotid arterial disease ([place])    a. 05/2015 Carotid U/S: RICA 1-74%, LICA 94-49%, RECA 6-75%, LECA >50% - overall stable, f/u 1 yr.  . Complication of anesthesia    Three days after procedure pt statrted to suffer with memoy loss that took about 4-5 days to come back according to [name]."  . Dilated cardiomyopathy ([place])    a. 05/2005 Echo: EF 65%; b. 01/22/2016 Echo: EF 25-30% (in setting of admission for urosepsis w/ elev trop); c. 01/26/2016 Echo: EF 35-40%, inf, distal apical, apical, and mid to distal septal HK-->felt to be 2/2 stress induced CM.  [name] Heart murmur   . History of bronchitis   . Hyperlipidemia   . Hypertension   . Hypertensive heart disease   . Memory impairment   . MRSA (methicillin resistant staph aureus) culture positive   . Nephrolithiasis    a. 12/2015 UVJ stone w/ hydroureteronephrosis req nephrostomy tube placement.  . Osteoarthritis    a. 03/1637 s/p L TKA complicated by infections req skin  grafting.  [name] PVD (peripheral vascular disease) ([place])    a. Bilat common iliac dzs, nl ABI's --> med rx.  . Sepsis ([place])    a. 12/2015 admitted with Proteus mirabilis urosepsis/bacteremia.  . Wears glasses     Past Surgical History:  Procedure Laterality Date  . ABDOMINAL HYSTERECTOMY    . APPLICATION OF A-CELL OF EXTREMITY Left 12/08/2015   Procedure: APPLICATION OF A-CELL OF knee;  Surgeon: Loel Lofty [name], DO;  Location: [facility];   Service: Plastics;  Laterality: Left;  . APPLICATION OF WOUND VAC Left 11/16/2015   Procedure: APPLICATION OF WOUND VAC;  Surgeon: Loel Lofty [name], DO;  Location: [place];  Service: Plastics;  Laterality: Left;  . APPLICATION OF WOUND VAC Left 12/08/2015   Procedure: APPLICATION OF WOUND VAC  AND CAST to knee;  Surgeon: Loel Lofty [name], DO;  Location: [place];  Service: Plastics;  Laterality: Left;  . COLONOSCOPY    . CORONARY ANGIOPLASTY  07   3 stents placed  . ESOPHAGOGASTRODUODENOSCOPY    . EYE SURGERY Bilateral    cataracts  . FREE FLAP TO EXTREMITY Left 11/16/2015   Procedure: FREE FLAP TO EXTREMITY;  Surgeon: Loel Lofty [name], DO;  Location: [place];  Service: Plastics;  Laterality: Left;  . I & D EXTREMITY Left 10/18/2015   Procedure: Left Knee Washout, Reclosure;  Surgeon: Newt Minion, MD;  Location: [place];  Service: Orthopedics;  Laterality: Left;  . I & D EXTREMITY Left 12/08/2015   Procedure: IRRIGATION AND DEBRIDEMENT knee;  Surgeon: Loel Lofty [name], DO;  Location: [place];  Service: Plastics;  Laterality: Left;  . I & D KNEE WITH POLY EXCHANGE Left 10/12/2015   Procedure: Irrigation and Debridement , Poly Exchange, Antibiotic Beads Left Knee;  Surgeon: Newt Minion, MD;  Location: [facility] [place];  Service: Orthopedics;  Laterality: Left;  . INCISION AND DRAINAGE OF WOUND Left 01/04/2016   Procedure: DEBRIDEMENT OF LEFT KNEE WOUND WITH ACELL AND WOUND VAC PLACEMENT;  Surgeon: Loel Lofty [name], DO;  Location: [place];  Service: Plastics;  Laterality: Left;  . IR GENERIC HISTORICAL  05/11/2016   IR FLUORO GUIDE CV MIDLINE PICC RIGHT 05/11/2016 Sandi Mariscal, MD [facility]  . IR GENERIC HISTORICAL  05/11/2016   IR US GUIDE VASC ACCESS RIGHT 05/11/2016 Sandi Mariscal, MD [facility]  . KNEE CLOSED REDUCTION Left 01/16/2017   Procedure: CLOSED MANIPULATION LEFT KNEE;  Surgeon: Leandrew Koyanagi, MD;  Location: [place];  Service: Orthopedics;  Laterality: Left;  . KNEE SURGERY      denies  . SKIN SPLIT GRAFT Left 11/16/2015   Procedure: LEFT GASTROC MUSCLE FLAP WITH SKIN GRAFT SPLIT THICKNESS AND VAC PLACEMENT;  Surgeon: Loel Lofty [name], DO;  Location: [place];  Service: Plastics;  Laterality: Left;  . TOTAL HIP ARTHROPLASTY Right 09/10/2012   Dr Sharol Given  . TOTAL HIP ARTHROPLASTY Right 09/10/2012   Procedure: TOTAL HIP ARTHROPLASTY;  Surgeon: Newt Minion, MD;  Location: [place];  Service: Orthopedics;  Laterality: Right;  Right Total Hip Arthroplasty  . TOTAL HIP ARTHROPLASTY Left 06/27/2016   Procedure: LEFT TOTAL HIP ARTHROPLASTY ANTERIOR APPROACH;  Surgeon: Leandrew Koyanagi, MD;  Location: [place];  Service: Orthopedics;  Laterality: Left;  . TOTAL KNEE ARTHROPLASTY Left 09/28/2015   Procedure: TOTAL KNEE ARTHROPLASTY;  Surgeon: Newt Minion, MD;  Location: [place];  Service: Orthopedics;  Laterality: Left;    Social History   Tobacco Use  . Smoking  status: Former Smoker    Packs/day: 0.50    Years: 50.00    Pack years: 25.00    Types: Cigarettes    Quit date: 09/28/2015    Years since quitting: 4.6  . Smokeless tobacco: Never Used  . Tobacco comment: quit 09/27/15  Substance Use Topics  . Alcohol use: No  . Drug use: No    Family History  Problem Relation Age of Onset  . Breast cancer Mother   . Leukemia Father   . Stroke Sister   . Bone cancer Other     No Known Allergies  Medication list has been reviewed and updated.  Current Outpatient Medications on File Prior to Visit  Medication Sig Dispense Refill  . amLODipine (NORVASC) 10 MG tablet Take 1 tablet (10 mg total) by mouth daily. 90 tablet 3  . aspirin EC 81 MG tablet Take 81 mg by mouth daily.    [name] atorvastatin (LIPITOR) 40 MG tablet Take 1 tablet (40 mg total) by mouth daily. 90 tablet 3  . Calcium Carbonate-Vit D-Min (CALCIUM 1200 PO) Take 1 tablet by mouth daily.     . carvedilol (COREG) 25 MG tablet Take 1 tablet (25 mg total) by mouth 2 (two) times daily with a meal. 60 tablet 0  .  diclofenac sodium (VOLTAREN) 1 % GEL Apply 2 g topically 4 (four) times daily. Use as needed for joint pain.  Max 32g/day total 100 g 3  . doxycycline (VIBRA-TABS) 100 MG tablet Take 1 tablet (100 mg total) by mouth 2 (two) times daily. 60 tablet 6  . furosemide (LASIX) 20 MG tablet Take 1 tablet (20 mg total) by mouth every other day. 45 tablet 3  . hydrALAZINE (APRESOLINE) 25 MG tablet Take 1 tablet (25 mg total) by mouth every 8 (eight) hours. 270 tablet 3  . lisinopril (ZESTRIL) 40 MG tablet Take 1 tablet (40 mg total) by mouth daily. 30 tablet 0  . Magnesium Oxide (MAG-OXIDE) 200 MG TABS Take 200 mg by mouth every morning.     . mesalamine (LIALDA) 1.2 g EC tablet Take 2.4 g by mouth daily with breakfast.    . Multiple Vitamin (MULITIVITAMIN WITH MINERALS) TABS Take 1 tablet by mouth daily.    . potassium chloride SA (KLOR-CON M20) 20 MEQ tablet Take 1 tablet (20 mEq total) by mouth daily. 90 tablet 3  . Probiotic Product (PROBIOTIC DAILY PO) Take 1 capsule by mouth daily.    . rifampin (RIFADIN) 300 MG capsule Take 1 capsule (300 mg total) by mouth 2 (two) times daily. 60 capsule 6  . traMADol (ULTRAM) 50 MG tablet Take 1-2 tablets (50-100 mg total) by mouth every 12 (twelve) hours as needed. 30 tablet 0   No current facility-administered medications on file prior to visit.    Review of Systems:  As per HPI- otherwise negative.   Physical Examination: There were no vitals filed for this visit. There were no vitals filed for this visit. There is no height or weight on file to calculate BMI. Ideal Body Weight:    GEN: no acute distress. HEENT: Atraumatic, Normocephalic.  Ears and Nose: No external deformity. CV: RRR, No M/G/R. No JVD. No thrill. No extra heart sounds. PULM: CTA B, no wheezes, crackles, rhonchi. No retractions. No resp. distress. No accessory muscle use. ABD: S, NT, ND, +BS. No rebound. No HSM. EXTR: No c/c/e PSYCH: Normally interactive. Conversant.     Assessment and Plan: *** This visit occurred during the SARS-CoV-2  public health emergency.  Safety protocols were in place, including screening questions prior to the visit, additional usage of staff PPE, and extensive cleaning of exam room while observing appropriate contact time as indicated for disinfecting solutions.    Signed Lamar Blinks, MD

## 2020-05-16 ENCOUNTER — Ambulatory Visit: Payer: Medicare Other | Admitting: Family Medicine

## 2020-05-17 NOTE — Progress Notes (Addendum)
[facility] [address] [place], [place] [address] [phone]  Date:  05/19/2020   Name:  [name]   DOB:  [birth year]   MRN:  [id number]  PCP:  Darreld Mclean, MD    Chief Complaint: Medication Management   History of Present Illness:  [name] is a 79 y.o. very pleasant female patient who presents with the following:  Pt here today for medication recheck Last seen by myself about 1 year ago-at that point she had recently been admitted with a syncopal event She has history of CAD status post stents x3, aortic stenosis, hyperlipidemia, hypertension, peripheral vascular disease, COPD.  In 2017 she had a left total knee, complicated by MRSA infection.  She underwent several follow-up procedures and had an episode of sepsis due to obstructive kidney stone   Her orthopedist, Dr. Ninfa Linden is now planning to do a right total knee for her.  Surgery date has not yet been set.  She continues to take chronic doxy/rifampin but her knee sx have been under good control recently  Her cardiologist is Dr. [name], most recent visit August 30-it looks like they had wanted to get a Lexi scan prior to surgery, I do not believe this is been scheduled yet.  She does have a cardiac testing appt later this month  She is complaint with her BP meds, BP under good control  COVID-19 series- done, she plans to do booster at pharmacy Flu shot- give today Bone density scan- due, ordered  Mammogram- due, ordered   Her main other concern today is pain in her her left hand esp at the 2nd MCP.  She has quite a bit of joint hypertrophy at this site.  NKI.  She has aching and stiffness, the hand is hard to use She is wearing an OTC wrist brace right now for support    Patient Active Problem List   Diagnosis Date Noted  . Syncope 04/01/2019  . Closed fracture of multiple pubic rami ([place]) 07/22/2018  . Closed fracture of right  superior rim of pubis ([place])   . Lateral dislocation of left patella 01/15/2017  . Unilateral primary osteoarthritis, right knee 01/15/2017  . Osteoporosis 11/26/2016  . Acute pain of left knee 08/27/2016  . Chronic diarrhea 08/07/2016  . Left displaced femoral neck fracture ([place]) 06/26/2016  . MRSA (methicillin resistant Staphylococcus aureus) infection 06/26/2016  . Hypertensive heart disease   . Hydronephrosis   . Obstructive uropathy   . Abnormal echocardiogram 01/23/2016  . Pressure ulcer 01/21/2016  . Chronic infection of prosthetic knee ([place]) 10/12/2015  . AAA 01/19/2009  . ILIAC ARTERY ANEURYSM 01/19/2009  . Hyperlipidemia 01/14/2009  . TOBACCO ABUSE 01/14/2009  . Essential hypertension 01/14/2009  . Coronary atherosclerosis 01/14/2009  . Cerebrovascular disease 01/14/2009  . Peripheral vascular disease ([place]) 01/14/2009  . CHRONIC OBSTRUCTIVE PULMONARY DISEASE 01/14/2009    Past Medical History:  Diagnosis Date  . Arthritis   . CAD (coronary artery disease)    a. 02/2007 s/p PCI/DES ot LCX and RCA;  b. 07/2014 MV: no ischemia/infarct, EF 68%.  . Carotid arterial disease ([place])    a. 05/2015 Carotid U/S: RICA 5-59%, LICA 74-16%, RECA 3-84%, LECA >50% - overall stable, f/u 1 yr.  . Complication of anesthesia    Three days after procedure pt statrted to suffer with memoy loss that took about 4-5 days to come back according to [name]."  .  Dilated cardiomyopathy ([place])    a. 05/2005 Echo: EF 65%; b. 01/22/2016 Echo: EF 25-30% (in setting of admission for urosepsis w/ elev trop); c. 01/26/2016 Echo: EF 35-40%, inf, distal apical, apical, and mid to distal septal HK-->felt to be 2/2 stress induced CM.  [name] Heart murmur   . History of bronchitis   . Hyperlipidemia   . Hypertension   . Hypertensive heart disease   . Memory impairment   . MRSA (methicillin resistant staph aureus) culture positive   . Nephrolithiasis    a. 12/2015 UVJ stone w/ hydroureteronephrosis req nephrostomy  tube placement.  . Osteoarthritis    a. 09/6142 s/p L TKA complicated by infections req skin grafting.  [name] PVD (peripheral vascular disease) ([place])    a. Bilat common iliac dzs, nl ABI's --> med rx.  . Sepsis ([place])    a. 12/2015 admitted with Proteus mirabilis urosepsis/bacteremia.  . Wears glasses     Past Surgical History:  Procedure Laterality Date  . ABDOMINAL HYSTERECTOMY    . APPLICATION OF A-CELL OF EXTREMITY Left 12/08/2015   Procedure: APPLICATION OF A-CELL OF knee;  Surgeon: Loel Lofty [name], DO;  Location: [facility];  Service: Plastics;  Laterality: Left;  . APPLICATION OF WOUND VAC Left 11/16/2015   Procedure: APPLICATION OF WOUND VAC;  Surgeon: Loel Lofty [name], DO;  Location: [facility];  Service: Plastics;  Laterality: Left;  . APPLICATION OF WOUND VAC Left 12/08/2015   Procedure: APPLICATION OF WOUND VAC  AND CAST to knee;  Surgeon: Loel Lofty [name], DO;  Location: [facility];  Service: Plastics;  Laterality: Left;  . COLONOSCOPY    . CORONARY ANGIOPLASTY  07   3 stents placed  . ESOPHAGOGASTRODUODENOSCOPY    . EYE SURGERY Bilateral    cataracts  . FREE FLAP TO EXTREMITY Left 11/16/2015   Procedure: FREE FLAP TO EXTREMITY;  Surgeon: Loel Lofty [name], DO;  Location: [facility];  Service: Plastics;  Laterality: Left;  . I & D EXTREMITY Left 10/18/2015   Procedure: Left Knee Washout, Reclosure;  Surgeon: Newt Minion, MD;  Location: [facility];  Service: Orthopedics;  Laterality: Left;  . I & D EXTREMITY Left 12/08/2015   Procedure: IRRIGATION AND DEBRIDEMENT knee;  Surgeon: Loel Lofty [name], DO;  Location: [facility];  Service: Plastics;  Laterality: Left;  . I & D KNEE WITH POLY EXCHANGE Left 10/12/2015   Procedure: Irrigation and Debridement , Poly Exchange, Antibiotic Beads Left Knee;  Surgeon: Newt Minion, MD;  Location: [facility];  Service: Orthopedics;  Laterality: Left;  . INCISION AND DRAINAGE OF WOUND Left 01/04/2016   Procedure: DEBRIDEMENT OF LEFT KNEE WOUND WITH ACELL AND WOUND VAC  PLACEMENT;  Surgeon: Loel Lofty [name], DO;  Location: [facility];  Service: Plastics;  Laterality: Left;  . IR GENERIC HISTORICAL  05/11/2016   IR FLUORO GUIDE CV MIDLINE PICC RIGHT 05/11/2016 Sandi Mariscal, MD [facility]  . IR GENERIC HISTORICAL  05/11/2016   IR US GUIDE VASC ACCESS RIGHT 05/11/2016 Sandi Mariscal, MD [facility]  . KNEE CLOSED REDUCTION Left 01/16/2017   Procedure: CLOSED MANIPULATION LEFT KNEE;  Surgeon: Leandrew Koyanagi, MD;  Location: [facility];  Service: Orthopedics;  Laterality: Left;  . KNEE SURGERY     denies  . SKIN SPLIT GRAFT Left 11/16/2015   Procedure: LEFT GASTROC MUSCLE FLAP WITH SKIN GRAFT SPLIT THICKNESS AND VAC PLACEMENT;  Surgeon: Loel Lofty [name], DO;  Location: [facility];  Service: Plastics;  Laterality: Left;  .  TOTAL HIP ARTHROPLASTY Right 09/10/2012   Dr Sharol Given  . TOTAL HIP ARTHROPLASTY Right 09/10/2012   Procedure: TOTAL HIP ARTHROPLASTY;  Surgeon: Newt Minion, MD;  Location: [facility];  Service: Orthopedics;  Laterality: Right;  Right Total Hip Arthroplasty  . TOTAL HIP ARTHROPLASTY Left 06/27/2016   Procedure: LEFT TOTAL HIP ARTHROPLASTY ANTERIOR APPROACH;  Surgeon: Leandrew Koyanagi, MD;  Location: [place] [facility];  Service: Orthopedics;  Laterality: Left;  . TOTAL KNEE ARTHROPLASTY Left 09/28/2015   Procedure: TOTAL KNEE ARTHROPLASTY;  Surgeon: Newt Minion, MD;  Location: [facility];  Service: Orthopedics;  Laterality: Left;    Social History   Tobacco Use  . Smoking status: Former Smoker    Packs/day: 0.50    Years: 50.00    Pack years: 25.00    Types: Cigarettes    Quit date: 09/28/2015    Years since quitting: 4.6  . Smokeless tobacco: Never Used  . Tobacco comment: quit 09/27/15  Substance Use Topics  . Alcohol use: No  . Drug use: No    Family History  Problem Relation Age of Onset  . Breast cancer Mother   . Leukemia Father   . Stroke Sister   . Bone cancer Other     No Known Allergies  Medication list has been reviewed and  updated.  Current Outpatient Medications on File Prior to Visit  Medication Sig Dispense Refill  . amLODipine (NORVASC) 10 MG tablet Take 1 tablet (10 mg total) by mouth daily. 90 tablet 3  . aspirin EC 81 MG tablet Take 81 mg by mouth daily.    [name] atorvastatin (LIPITOR) 40 MG tablet Take 1 tablet (40 mg total) by mouth daily. 90 tablet 3  . Calcium Carbonate-Vit D-Min (CALCIUM 1200 PO) Take 1 tablet by mouth daily.     . carvedilol (COREG) 25 MG tablet Take 1 tablet (25 mg total) by mouth 2 (two) times daily with a meal. 60 tablet 0  . doxycycline (VIBRA-TABS) 100 MG tablet Take 1 tablet (100 mg total) by mouth 2 (two) times daily. 60 tablet 6  . furosemide (LASIX) 20 MG tablet Take 1 tablet (20 mg total) by mouth every other day. 45 tablet 3  . hydrALAZINE (APRESOLINE) 25 MG tablet Take 1 tablet (25 mg total) by mouth every 8 (eight) hours. 270 tablet 3  . lisinopril (ZESTRIL) 40 MG tablet Take 1 tablet (40 mg total) by mouth daily. 30 tablet 0  . Magnesium Oxide (MAG-OXIDE) 200 MG TABS Take 200 mg by mouth every morning.     . Multiple Vitamin (MULITIVITAMIN WITH MINERALS) TABS Take 1 tablet by mouth daily.    . potassium chloride SA (KLOR-CON M20) 20 MEQ tablet Take 1 tablet (20 mEq total) by mouth daily. 90 tablet 3  . Probiotic Product (PROBIOTIC DAILY PO) Take 1 capsule by mouth daily.    . rifampin (RIFADIN) 300 MG capsule Take 1 capsule (300 mg total) by mouth 2 (two) times daily. 60 capsule 6  . traMADol (ULTRAM) 50 MG tablet Take 1-2 tablets (50-100 mg total) by mouth every 12 (twelve) hours as needed. 30 tablet 0   No current facility-administered medications on file prior to visit.    Review of Systems:  As per HPI- otherwise negative.   Physical Examination: Vitals:   05/19/20 1019  BP: 124/78  Pulse: 64  Resp: 16  Temp: 98.1 F (36.7 C)  SpO2: 97%   There were no vitals filed for this visit. There is no  height or weight on file to calculate BMI. Ideal Body  Weight:    GEN: no acute distress.  Elderly lady siting in [place], wearing left sided knee brace.  Accompanied by her daughter HEENT: Atraumatic, Normocephalic.  Ears and Nose: No external deformity. CV: RRR, No M/G/R. No JVD. No thrill. No extra heart sounds. PULM: CTA B, no wheezes, crackles, rhonchi. No retractions. No resp. distress. No accessory muscle use. ABD: S, NT, ND, +BS. No rebound. No HSM. EXTR: No c/c/e.  AFO left side as well  PSYCH: Normally interactive. Conversant.  Left hand: She has joint hypertrophy and tenderness especially at the left second MCP joint.  Diffuse joint changes consistent with osteoarthritis are present  Assessment and Plan: Essential hypertension - Plan: carvedilol (COREG) 25 MG tablet, lisinopril (ZESTRIL) 40 MG tablet, CBC, Comprehensive metabolic panel  Cerebrovascular disease  Peripheral vascular disease (HCC)  Atherosclerosis of native coronary artery of native heart without angina pectoris  Pain of left hand - Plan: Uric acid, DG Hand Complete Left  Estrogen deficiency - Plan: DG Bone Density  Screening mammogram for breast cancer - Plan: MM 3D SCREEN BREAST BILATERAL  Magnesium deficiency - Plan: Magnesium  Needs flu shot - Plan: Flu Vaccine QUAD High Dose(Fluad)    Patient here today for follow-up visit Reminded that she needs cardiac testing prior to her planned knee surgery.  We think this is scheduled Blood pressures under good control, labs pending and medications refilled She has been troubled more by left hand pain recently.  She mentions that she was told she might have gout at some point in the past.  She would like to investigate this further with a uric acid level which is certainly fine.  Suggested that he try topical diclofenac gel on her painful joints, will obtain a plain film of the hand today Flu shot given Routine labs pending as above Will plan further follow- up pending labs.  This visit occurred during the  SARS-CoV-2 public health emergency.  Safety protocols were in place, including screening questions prior to the visit, additional usage of staff PPE, and extensive cleaning of exam room while observing appropriate contact time as indicated for disinfecting solutions.    Signed Lamar Blinks, MD  Addendum 10/22, received her labs as below-letter to patient Results for orders placed or performed in visit on 05/19/20  Uric acid  Result Value Ref Range   Uric Acid, Serum 3.1 2.5 - 7.0 mg/dL  CBC  Result Value Ref Range   WBC 8.0 3.8 - 10.8 Thousand/uL   RBC 4.09 3.80 - 5.10 Million/uL   Hemoglobin 13.1 11.7 - 15.5 g/dL   HCT 39.8 35 - 45 %   MCV 97.3 80.0 - 100.0 fL   MCH 32.0 27.0 - 33.0 pg   MCHC 32.9 32.0 - 36.0 g/dL   RDW 12.0 11.0 - 15.0 %   Platelets 229 140 - 400 Thousand/uL   MPV 12.5 7.5 - 12.5 fL  Comprehensive metabolic panel  Result Value Ref Range   Glucose, Bld 96 65 - 99 mg/dL   BUN 17 7 - 25 mg/dL   Creat 0.84 0.60 - 0.93 mg/dL   BUN/Creatinine Ratio NOT APPLICABLE 6 - 22 (calc)   Sodium 140 135 - 146 mmol/L   Potassium 4.9 3.5 - 5.3 mmol/L   Chloride 103 98 - 110 mmol/L   CO2 28 20 - 32 mmol/L   Calcium 9.8 8.6 - 10.4 mg/dL   Total Protein 6.0 (L) 6.1 -  8.1 g/dL   Albumin 3.5 (L) 3.6 - 5.1 g/dL   Globulin 2.5 1.9 - 3.7 g/dL (calc)   AG Ratio 1.4 1.0 - 2.5 (calc)   Total Bilirubin 0.5 0.2 - 1.2 mg/dL   Alkaline phosphatase (APISO) 88 37 - 153 U/L   AST 20 10 - 35 U/L   ALT 13 6 - 29 U/L  Magnesium  Result Value Ref Range   Magnesium 2.0 1.5 - 2.5 mg/dL

## 2020-05-19 ENCOUNTER — Encounter: Payer: Self-pay | Admitting: Family Medicine

## 2020-05-19 ENCOUNTER — Other Ambulatory Visit: Payer: Self-pay

## 2020-05-19 ENCOUNTER — Ambulatory Visit (INDEPENDENT_AMBULATORY_CARE_PROVIDER_SITE_OTHER): Payer: Medicare Other | Admitting: Family Medicine

## 2020-05-19 ENCOUNTER — Ambulatory Visit (HOSPITAL_BASED_OUTPATIENT_CLINIC_OR_DEPARTMENT_OTHER)
Admission: RE | Admit: 2020-05-19 | Discharge: 2020-05-19 | Disposition: A | Discharge status: Home / Self Care | Payer: Medicare Other | Source: Ambulatory Visit | Attending: Family Medicine | Admitting: Family Medicine

## 2020-05-19 VITALS — BP 124/78 | HR 64 | Temp 98.1°F | Resp 16

## 2020-05-19 DIAGNOSIS — I2510 Atherosclerotic heart disease of native coronary artery without angina pectoris: Secondary | ICD-10-CM | POA: Diagnosis not present

## 2020-05-19 DIAGNOSIS — I679 Cerebrovascular disease, unspecified: Secondary | ICD-10-CM

## 2020-05-19 DIAGNOSIS — I10 Essential (primary) hypertension: Secondary | ICD-10-CM

## 2020-05-19 DIAGNOSIS — Z23 Encounter for immunization: Secondary | ICD-10-CM | POA: Diagnosis not present

## 2020-05-19 DIAGNOSIS — I739 Peripheral vascular disease, unspecified: Secondary | ICD-10-CM | POA: Diagnosis not present

## 2020-05-19 DIAGNOSIS — E612 Magnesium deficiency: Secondary | ICD-10-CM | POA: Diagnosis not present

## 2020-05-19 DIAGNOSIS — Z1231 Encounter for screening mammogram for malignant neoplasm of breast: Secondary | ICD-10-CM | POA: Diagnosis not present

## 2020-05-19 DIAGNOSIS — M79642 Pain in left hand: Secondary | ICD-10-CM | POA: Diagnosis not present

## 2020-05-19 DIAGNOSIS — E2839 Other primary ovarian failure: Secondary | ICD-10-CM

## 2020-05-19 LAB — COMPREHENSIVE METABOLIC PANEL
AG Ratio: 1.4 (calc) (ref 1.0–2.5)
ALT: 13 U/L (ref 6–29)
AST: 20 U/L (ref 10–35)
Albumin: 3.5 g/dL — ABNORMAL LOW (ref 3.6–5.1)
Alkaline phosphatase (APISO): 88 U/L (ref 37–153)
BUN: 17 mg/dL (ref 7–25)
CO2: 28 mmol/L (ref 20–32)
Calcium: 9.8 mg/dL (ref 8.6–10.4)
Chloride: 103 mmol/L (ref 98–110)
Creat: 0.84 mg/dL (ref 0.60–0.93)
Globulin: 2.5 g/dL (calc) (ref 1.9–3.7)
Glucose, Bld: 96 mg/dL (ref 65–99)
Potassium: 4.9 mmol/L (ref 3.5–5.3)
Sodium: 140 mmol/L (ref 135–146)
Total Bilirubin: 0.5 mg/dL (ref 0.2–1.2)
Total Protein: 6 g/dL — ABNORMAL LOW (ref 6.1–8.1)

## 2020-05-19 LAB — CBC
HCT: 39.8 % (ref 35.0–45.0)
Hemoglobin: 13.1 g/dL (ref 11.7–15.5)
MCH: 32 pg (ref 27.0–33.0)
MCHC: 32.9 g/dL (ref 32.0–36.0)
MCV: 97.3 fL (ref 80.0–100.0)
MPV: 12.5 fL (ref 7.5–12.5)
Platelets: 229 10*3/uL (ref 140–400)
RBC: 4.09 10*6/uL (ref 3.80–5.10)
RDW: 12 % (ref 11.0–15.0)
WBC: 8 10*3/uL (ref 3.8–10.8)

## 2020-05-19 LAB — URIC ACID: Uric Acid, Serum: 3.1 mg/dL (ref 2.5–7.0)

## 2020-05-19 LAB — MAGNESIUM: Magnesium: 2 mg/dL (ref 1.5–2.5)

## 2020-05-19 MED ORDER — LISINOPRIL 40 MG PO TABS: 40.0000 mg | ORAL_TABLET | Freq: Every day | ORAL | 3 refills | Status: DC

## 2020-05-19 MED ORDER — CARVEDILOL 25 MG PO TABS: 25.0000 mg | ORAL_TABLET | Freq: Two times a day (BID) | ORAL | 3 refills | Status: DC

## 2020-05-19 NOTE — Patient Instructions (Signed)
Good to see you again today!  Best of luck with your upcoming knee operation Please stop by imaging on the ground floor today to have your left hand x-ray taken.  You can also schedule your mammogram/ bone density scan  I will be in touch with your labs asap- we will see if your uric acid level is high to suggest gout  I would suggest using diclofenac gel for your painful hand joints as needed.  If you would like to see orthopedics about your hand just let me know

## 2020-05-20 ENCOUNTER — Ambulatory Visit: Payer: Medicare Other | Attending: Internal Medicine

## 2020-05-20 ENCOUNTER — Other Ambulatory Visit (HOSPITAL_BASED_OUTPATIENT_CLINIC_OR_DEPARTMENT_OTHER): Payer: Self-pay | Admitting: Internal Medicine

## 2020-05-20 DIAGNOSIS — Z23 Encounter for immunization: Secondary | ICD-10-CM

## 2020-05-26 ENCOUNTER — Telehealth: Payer: Self-pay

## 2020-05-26 ENCOUNTER — Other Ambulatory Visit: Payer: Self-pay

## 2020-05-26 ENCOUNTER — Ambulatory Visit (HOSPITAL_COMMUNITY): Payer: Medicare Other | Attending: Cardiovascular Disease

## 2020-05-26 DIAGNOSIS — I350 Nonrheumatic aortic (valve) stenosis: Secondary | ICD-10-CM | POA: Diagnosis not present

## 2020-05-26 DIAGNOSIS — Z01818 Encounter for other preprocedural examination: Secondary | ICD-10-CM

## 2020-05-26 DIAGNOSIS — I2510 Atherosclerotic heart disease of native coronary artery without angina pectoris: Secondary | ICD-10-CM

## 2020-05-26 LAB — ECHOCARDIOGRAM COMPLETE
AR max vel: 1.39 cm2
AV Area VTI: 1.53 cm2
AV Area mean vel: 1.45 cm2
AV Mean grad: 10 mmHg
AV Peak grad: 21.5 mmHg
Ao pk vel: 2.32 m/s
Area-P 1/2: 2.13 cm2
P 1/2 time: 473 msec
S' Lateral: 2.6 cm

## 2020-05-26 NOTE — Telephone Encounter (Signed)
Patient thought she was suppose to get an echo and lexiscan today. Informed her per office note in August " preoperative evaluation prior to right knee replacement-she has limited functional capacity due to knee pain. We would therefore need to proceed with [place] nuclear study for risk stratification preoperatively if she plans to proceed. She will contact us with that decision".  Patient is going to go forward with surgery, so she would like to get stress test done. Will forward to Dr. [name] for order to have test.

## 2020-05-26 NOTE — Telephone Encounter (Signed)
Schedule lexiscan nuclear study Kirk Ruths

## 2020-05-27 ENCOUNTER — Telehealth: Payer: Self-pay | Admitting: Orthopaedic Surgery

## 2020-05-27 DIAGNOSIS — G8929 Other chronic pain: Secondary | ICD-10-CM

## 2020-05-27 MED ORDER — TRAMADOL HCL 50 MG PO TABS: 50.0000 mg | ORAL_TABLET | Freq: Two times a day (BID) | ORAL | 0 refills | Status: DC | PRN

## 2020-05-27 MED FILL — PFIZER-BIONTECH COVID-19 VA: 30 | 1 days supply | Qty: 0 | Fill #0

## 2020-05-27 NOTE — Telephone Encounter (Signed)
I sent in some to her pharmacy.

## 2020-05-27 NOTE — Telephone Encounter (Signed)
Spoke with pt son, lexiscan ordered and scheduled. Instructions discussed with son.

## 2020-05-27 NOTE — Telephone Encounter (Signed)
Please advise 

## 2020-05-27 NOTE — Telephone Encounter (Signed)
Patient called refill of tramadol. Patient is asking for medication before the weekend because she is in severe pain. Please call patient when medication has been sent in to pharmacy. Patient phone number is [phone].

## 2020-05-30 ENCOUNTER — Telehealth (HOSPITAL_COMMUNITY): Payer: Self-pay

## 2020-05-30 NOTE — Telephone Encounter (Signed)
Spoke with the patient, detailed instructions given. Asked to call back with any questions. S.Vennie Waymire EMTP

## 2020-05-31 ENCOUNTER — Ambulatory Visit (HOSPITAL_COMMUNITY): Payer: Medicare Other | Attending: Cardiovascular Disease

## 2020-05-31 ENCOUNTER — Other Ambulatory Visit: Payer: Self-pay

## 2020-05-31 DIAGNOSIS — Z01818 Encounter for other preprocedural examination: Secondary | ICD-10-CM | POA: Diagnosis not present

## 2020-05-31 DIAGNOSIS — I2510 Atherosclerotic heart disease of native coronary artery without angina pectoris: Secondary | ICD-10-CM | POA: Diagnosis not present

## 2020-05-31 LAB — MYOCARDIAL PERFUSION IMAGING
LV dias vol: 41 mL (ref 46–106)
LV sys vol: 14 mL
Peak HR: 83 {beats}/min
Rest HR: 67 {beats}/min
SDS: 5
SRS: 0
SSS: 5
TID: 0.84

## 2020-05-31 MED ORDER — TECHNETIUM TC 99M TETROFOSMIN IV KIT
32.2000 | PACK | Freq: Once | INTRAVENOUS | Status: AC | PRN
  Administered 2020-05-31: 32.2 via INTRAVENOUS
  Filled 2020-05-31: qty 33

## 2020-05-31 MED ORDER — TECHNETIUM TC 99M TETROFOSMIN IV KIT
10.6000 | PACK | Freq: Once | INTRAVENOUS | Status: AC | PRN
  Administered 2020-05-31: 10.6 via INTRAVENOUS
  Filled 2020-05-31: qty 11

## 2020-05-31 MED ORDER — REGADENOSON 0.4 MG/5ML IV SOLN
0.4000 mg | Freq: Once | INTRAVENOUS | Status: AC
  Administered 2020-05-31: 0.4 mg via INTRAVENOUS

## 2020-06-13 ENCOUNTER — Other Ambulatory Visit: Payer: Self-pay

## 2020-06-19 ENCOUNTER — Other Ambulatory Visit: Payer: Self-pay | Admitting: Family Medicine

## 2020-06-19 DIAGNOSIS — I2510 Atherosclerotic heart disease of native coronary artery without angina pectoris: Secondary | ICD-10-CM

## 2020-06-19 DIAGNOSIS — I679 Cerebrovascular disease, unspecified: Secondary | ICD-10-CM

## 2020-06-22 ENCOUNTER — Other Ambulatory Visit: Payer: Self-pay | Admitting: Physician Assistant

## 2020-06-28 ENCOUNTER — Other Ambulatory Visit: Payer: Self-pay | Admitting: Physician Assistant

## 2020-06-28 NOTE — Progress Notes (Signed)
Your procedure is scheduled on Tuesday, December 7th.  Report to [facility] Main Entrance "A" at 5:30 A.M., and check in at the Admitting office.  Call this number if you have problems the morning of surgery:  [phone]  Call [phone] if you have any questions prior to your surgery date Monday-Friday 8am-4pm   Remember:  Do not eat after midnight the night before your surgery  You may drink clear liquids until 4:30 A.M. the morning of your surgery.   Clear liquids allowed are: Water, Non-Citrus Juices (without pulp), Carbonated Beverages, Clear Tea, Black Coffee Only, and Gatorade   Enhanced Recovery after Surgery for Orthopedics Enhanced Recovery after Surgery is a protocol used to improve the stress on your body and your recovery after surgery.  Patient Instructions  . The night before surgery:  o No food after midnight. ONLY clear liquids after midnight  .  [name] The day of surgery (if you do NOT have diabetes):  o Drink ONE (1) Pre-Surgery Clear Ensure by 430 A.M. the morning of surgery. This drink was given to you during your hospital  pre-op appointment visit. o Nothing else to drink after completing the  Pre-Surgery Clear Ensure.         If you have questions, please contact your surgeon's office.   Take these medicines the morning of surgery with A SIP OF WATER  amLODipine (NORVASC)  atorvastatin (LIPITOR) carvedilol (COREG)  hydrALAZINE (APRESOLINE)    if needed: traMADol (ULTRAM)   Follow your surgeon's instructions on when to stop Aspirin.  If no instructions were given by your surgeon then you will need to call the office to get those instructions.    As of today, STOP takingAleve, Naproxen, Ibuprofen, Motrin, Advil, Goody's, BC's, all herbal medications, fish oil, and all vitamins.                     Do not wear jewelry, make up, or nail polish            Do not wear lotions, powders, perfumes, or deodorant.            Do not shave 48 hours prior to  surgery.             Do not bring valuables to the hospital.            [facility] is not responsible for any belongings or valuables.  Do NOT Smoke (Tobacco/Vaping) or drink Alcohol 24 hours prior to your procedure If you use a CPAP at night, you may bring all equipment for your overnight stay.   Contacts, glasses, dentures or bridgework may not be worn into surgery.      For patients admitted to the hospital, discharge time will be determined by your treatment team.   Patients discharged the day of surgery will not be allowed to drive home, and someone needs to stay with them for 24 hours.  Special instructions:   [facility]- Preparing For Surgery  Before surgery, you can play an important role. Because skin is not sterile, your skin needs to be as free of germs as possible. You can reduce the number of germs on your skin by washing with CHG (chlorahexidine gluconate) Soap before surgery.  CHG is an antiseptic cleaner which kills germs and bonds with the skin to continue killing germs even after washing.    Oral Hygiene is also important to reduce your risk of infection.  Remember - BRUSH YOUR TEETH THE  MORNING OF SURGERY WITH YOUR REGULAR TOOTHPASTE  Please do not use if you have an allergy to CHG or antibacterial soaps. If your skin becomes reddened/irritated stop using the CHG.  Do not shave (including legs and underarms) for at least 48 hours prior to first CHG shower. It is OK to shave your face.  Please follow these instructions carefully.   1. Shower the NIGHT BEFORE SURGERY and the MORNING OF SURGERY with CHG Soap.   2. If you chose to wash your hair, wash your hair first as usual with your normal shampoo.  3. After you shampoo, rinse your hair and body thoroughly to remove the shampoo.  4. Use CHG as you would any other liquid soap. You can apply CHG directly to the skin and wash gently with a scrungie or a clean washcloth.   5. Apply the CHG Soap to your body ONLY FROM  THE NECK DOWN.  Do not use on open wounds or open sores. Avoid contact with your eyes, ears, mouth and genitals (private parts). Wash Face and genitals (private parts)  with your normal soap.   6. Wash thoroughly, paying special attention to the area where your surgery will be performed.  7. Thoroughly rinse your body with warm water from the neck down.  8. DO NOT shower/wash with your normal soap after using and rinsing off the CHG Soap.  9. Pat yourself dry with a CLEAN TOWEL.  10. Wear CLEAN PAJAMAS to bed the night before surgery  11. Place CLEAN SHEETS on your bed the night of your first shower and DO NOT SLEEP WITH PETS. 12.  Day of Surgery: Wear Clean/Comfortable clothing the morning of surgery Do not apply any deodorants/lotions.   Remember to brush your teeth WITH YOUR REGULAR TOOTHPASTE.   Please read over the following fact sheets that you were given.

## 2020-06-29 ENCOUNTER — Encounter (HOSPITAL_COMMUNITY): Payer: Self-pay

## 2020-06-29 ENCOUNTER — Other Ambulatory Visit: Payer: Self-pay

## 2020-06-29 ENCOUNTER — Encounter (HOSPITAL_COMMUNITY)
Admission: RE | Admit: 2020-06-29 | Discharge: 2020-06-29 | Disposition: A | Discharge status: Home / Self Care | Payer: Medicare Other | Source: Ambulatory Visit | Attending: Orthopaedic Surgery | Admitting: Orthopaedic Surgery

## 2020-06-29 DIAGNOSIS — Z01812 Encounter for preprocedural laboratory examination: Secondary | ICD-10-CM | POA: Diagnosis not present

## 2020-06-29 LAB — SURGICAL PCR SCREEN
MRSA, PCR: NEGATIVE
Staphylococcus aureus: NEGATIVE

## 2020-06-29 LAB — CBC
HCT: 37 % (ref 36.0–46.0)
Hemoglobin: 12.3 g/dL (ref 12.0–15.0)
MCH: 32.4 pg (ref 26.0–34.0)
MCHC: 33.2 g/dL (ref 30.0–36.0)
MCV: 97.4 fL (ref 80.0–100.0)
Platelets: 257 10*3/uL (ref 150–400)
RBC: 3.8 MIL/uL — ABNORMAL LOW (ref 3.87–5.11)
RDW: 13.1 % (ref 11.5–15.5)
WBC: 7 10*3/uL (ref 4.0–10.5)
nRBC: 0 % (ref 0.0–0.2)

## 2020-06-29 LAB — BASIC METABOLIC PANEL
Anion gap: 12 (ref 5–15)
BUN: 20 mg/dL (ref 8–23)
CO2: 23 mmol/L (ref 22–32)
Calcium: 9.9 mg/dL (ref 8.9–10.3)
Chloride: 104 mmol/L (ref 98–111)
Creatinine, Ser: 0.89 mg/dL (ref 0.44–1.00)
GFR, Estimated: >60 mL/min (ref >60)
Glucose, Bld: 129 mg/dL — ABNORMAL HIGH (ref 70–99)
Potassium: 4.6 mmol/L (ref 3.5–5.1)
Sodium: 139 mmol/L (ref 135–145)

## 2020-06-29 LAB — TYPE AND SCREEN
ABO/RH(D): O POS
Antibody Screen: NEGATIVE

## 2020-06-29 NOTE — Progress Notes (Signed)
PCP - Dr. Janett Billow [place] Cardiologist - Dr. Kirk Ruths  PPM/ICD - denies  Chest x-ray - N/A EKG - 03/28/2020 Stress Test - 05/31/2020 ECHO - 05/26/2020 Cardiac Cath - greater than 10 years ago, stents placed per patient  Sleep Study - denies CPAP - N/A  DM: denies  Blood Thinner Instructions: N/A Aspirin Instructions: N/A  ERAS Protcol - PRE-SURGERY Ensure or G2-   COVID TEST- Scheduled for 07/01/2020. Patient verbalized understanding of self-quarantine instructions, appointment time and place.  Anesthesia review: YES, cardiac hx Patient stated stop taking Aspirin about three months ago and her cardiologist is aware.  Patient denies shortness of breath, fever, cough and chest pain at PAT appointment  All instructions explained to the patient, with a verbal understanding of the material. Patient agrees to go over the instructions while at home for a better understanding. Patient also instructed to self quarantine after being tested for COVID-19. The opportunity to ask questions was provided.

## 2020-06-30 ENCOUNTER — Encounter (HOSPITAL_COMMUNITY): Payer: Self-pay

## 2020-06-30 NOTE — Anesthesia Preprocedure Evaluation (Addendum)
Anesthesia Evaluation  Patient identified by MRN, date of birth, ID band Patient awake    Reviewed: Allergy & Precautions, NPO status , Patient's Chart, lab work & pertinent test results  History of Anesthesia Complications (+) history of anesthetic complications (questionable temporary, delayed memory impairment following an anesthetic)  Airway Mallampati: II  TM Distance: >3 FB Neck ROM: Full    Dental  (+) Edentulous Upper, Edentulous Lower   Pulmonary COPD, former smoker,    Pulmonary exam normal        Cardiovascular hypertension, Pt. on medications and Pt. on home beta blockers (-) angina+ CAD, + Cardiac Stents and + Peripheral Vascular Disease  Normal cardiovascular exam+ Valvular Problems/Murmurs    '21 Myoperfusion - Nuclear stress EF: 67%. The left ventricular ejection fraction is normal (55-65%). This is a low risk study. There is no evidence of ischemia and no evidence of previous infarction.  '21 TTE - EF 60-65%. Mild AI and AS. AVA by VTI measures 1.53 cm. Aortic valve mean gradient measures 10.0 mmHg. Mild asymmetric left ventricular hypertrophy of the basal-septal segment. Grade II diastolic dysfunction (pseudonormalization). Trivial MR.     Neuro/Psych negative neurological ROS  negative psych ROS   GI/Hepatic negative GI ROS, Neg liver ROS,   Endo/Other  negative endocrine ROS  Renal/GU negative Renal ROS     Musculoskeletal  (+) Arthritis ,   Abdominal   Peds  Hematology negative hematology ROS (+)   Anesthesia Other Findings Covid test negative See PAT note   Reproductive/Obstetrics                           Anesthesia Physical Anesthesia Plan  ASA: III  Anesthesia Plan: Spinal   Post-op Pain Management:  Regional for Post-op pain   Induction:   PONV Risk Score and Plan: 2 and Treatment may vary due to age or medical condition and Propofol  infusion  Airway Management Planned: Natural Airway and Simple Face Mask  Additional Equipment: None  Intra-op Plan:   Post-operative Plan:   Informed Consent: I have reviewed the patients History and Physical, chart, labs and discussed the procedure including the risks, benefits and alternatives for the proposed anesthesia with the patient or authorized representative who has indicated his/her understanding and acceptance.       Plan Discussed with: CRNA and Anesthesiologist  Anesthesia Plan Comments: (Labs reviewed, platelets acceptable. Discussed risks and benefits of spinal, including spinal/epidural hematoma, infection, failed block, and PDPH. Patient expressed understanding and wished to proceed. )      Anesthesia Quick Evaluation

## 2020-06-30 NOTE — Progress Notes (Signed)
Anesthesia Chart Review:  Case: [id number] Date/Time: 07/05/20 0715   Procedure: RIGHT TOTAL KNEE ARTHROPLASTY (Right Knee)   Anesthesia type: Spinal   Pre-op diagnosis: osteoarthritis right knee   Location: [facility] OR ROOM 04 / [place] [facility]   Surgeons: Mcarthur Rossetti, MD      DISCUSSION: Patient is a 79 year old female scheduled for the above procedure.  History includes former smoker (quit 09/28/15), CAD (DES x3 proximal-mid RCA 03/10/07 & DES proximal CX 03/12/07), dilated cardiomyopathy ("probably stress induced CM 6/17 in setting of urosepsis"), murmur (mild AS/AR 04/2020), HTN, HLD, cerebrovascular disease, memory impairment, carotid artery disease (1-39% 03/2019), PVD, MRSA (left knee 2017), urosepsis (2017), GI bleed (gastric AVM s/p cautery and colonic ulcers 01/2016 [place]). She reported memory loss after a previous procedure and took about 5 days to return to baseline.   Cardiologist Dr. [name] recommended preoperative stress test which was done on 05/31/20. He wrote, "normal study; ok for surgery". 04/2020 echo showed normal LVEF, grade II diastolic dysfunction, mild AS/AR. Patient says she is not currently taking aspirin.  Notes indicate she received Covid vaccines.  Presurgical COVID-19 test is scheduled for 07/01/2020.  Anesthesia team to evaluate on the day of surgery.   VS: BP (!) 101/53   Pulse 70   Temp 36.6 C   Resp 17   Ht 5\' 4"  (1.626 m)   Wt 51.5 kg   SpO2 96%   BMI 19.50 kg/m     PROVIDERS: Copland, Gay Filler, MD is PCP Kirk Ruths, MD is cardiologist   LABS: Labs reviewed: Acceptable for surgery. (all labs ordered are listed, but only abnormal results are displayed)  Labs Reviewed  BASIC METABOLIC PANEL - Abnormal; Notable for the following components:      Result Value   Glucose, Bld 129 (*)    All other components within normal limits  CBC - Abnormal; Notable for the following components:   RBC 3.80 (*)    All other components within normal limits   SURGICAL PCR SCREEN  TYPE AND SCREEN    EKG: 03/28/2020: Sinus rhythm with first-degree AV block   CV: Nuclear stress test 05/31/20:  Nuclear stress EF: 67%. The left ventricular ejection fraction is normal (55-65%).  This is a low risk study. There is no evidence of ischemia and no evidence of previous infarction.  The study is normal.   Echo 05/26/20: IMPRESSIONS  1. The aortic valve is tricuspid. There is moderate calcification of the  aortic valve. There is moderate thickening of the aortic valve. Aortic  valve regurgitation is mild. Mild aortic valve stenosis. Aortic valve  area, by VTI measures 1.53 cm. Aortic  valve mean gradient measures 10.0 mmHg. Aortic valve Vmax measures 2.32  m/s.  2. Left ventricular ejection fraction, by estimation, is 60 to 65%. Left  ventricular ejection fraction by 3D volume is 62 %. The left ventricle has  normal function. The left ventricle has no regional wall motion  abnormalities. There is mild asymmetric  left ventricular hypertrophy of the basal-septal segment. Left ventricular  diastolic parameters are consistent with Grade II diastolic dysfunction  (pseudonormalization). Elevated left atrial pressure. The average left  ventricular global longitudinal  strain is -18.5 %. The global longitudinal strain is normal.  3. Right ventricular systolic function is normal. The right ventricular  size is normal. There is normal pulmonary artery systolic pressure. The  estimated right ventricular systolic pressure is 57.8 mmHg.  4. The mitral valve is grossly normal. Trivial mitral valve  regurgitation. No evidence of mitral stenosis.  5. The inferior vena cava is normal in size with greater than 50%  respiratory variability, suggesting right atrial pressure of 3 mmHg.  - Comparison(s): No significant change from prior study. 07/06/19 EF 55-60%.  Mild AS 70mmHg mean PG, 16mmHg peak PG.    Carotid duplex 04/08/19: Summary:  Right  Carotid: Velocities in the right ICA are consistent with a 1-39%  stenosis. Upper end of range. The ECA appears >50% stenosed.  Left Carotid: Velocities in the left ICA are consistent with a 1-39%  stenosis. Upper end of range.  Subclavians: Right subclavian artery was stenotic. Normal flow  hemodynamics wereseen in the left subclavian artery.    AA duplex 08/08/12: Impressions:  - Normal caliber abdominal aorta, common and external iliac arteries, without dilatation - Moderate to severe aorto-iliac artherosclerosis, with moderate stenosis of the SMA - Moderate right common illiac artery stenosis - Severe left common iliac artery stenosis - Mild bilateral external iliac artery stenosis   Past Medical History:  Diagnosis Date  . Arthritis   . CAD (coronary artery disease)    a. 02/2007 s/p PCI/DES ot LCX and RCA;  b. 07/2014 MV: no ischemia/infarct, EF 68%.  . Carotid arterial disease ([place])    a. 05/2015 Carotid U/S: RICA 4-54%, LICA 09-81%, RECA 1-91%, LECA >50% - overall stable, f/u 1 yr.  . Complication of anesthesia    Three days after procedure pt statrted to suffer with memoy loss that took about 4-5 days to come back according to [name]."  . Dilated cardiomyopathy ([place])    a. 05/2005 Echo: EF 65%; b. 01/22/2016 Echo: EF 25-30% (in setting of admission for urosepsis w/ elev trop); c. 01/26/2016 Echo: EF 35-40%, inf, distal apical, apical, and mid to distal septal HK-->felt to be 2/2 stress induced CM.  [name] Heart murmur   . History of bronchitis   . Hyperlipidemia   . Hypertension   . Hypertensive heart disease   . Memory impairment   . MRSA (methicillin resistant staph aureus) culture positive   . Nephrolithiasis    a. 12/2015 UVJ stone w/ hydroureteronephrosis req nephrostomy tube placement.  . Osteoarthritis    a. 10/7827 s/p L TKA complicated by infections req skin grafting.  [name] PVD (peripheral vascular disease) ([place])    a. Bilat common iliac dzs, nl ABI's --> med rx.   . Sepsis ([place])    a. 12/2015 admitted with Proteus mirabilis urosepsis/bacteremia.  . Wears glasses     Past Surgical History:  Procedure Laterality Date  . ABDOMINAL HYSTERECTOMY    . APPLICATION OF A-CELL OF EXTREMITY Left 12/08/2015   Procedure: APPLICATION OF A-CELL OF knee;  Surgeon: Loel Lofty [name], DO;  Location: [place];  Service: Plastics;  Laterality: Left;  . APPLICATION OF WOUND VAC Left 11/16/2015   Procedure: APPLICATION OF WOUND VAC;  Surgeon: Loel Lofty [name], DO;  Location: [place];  Service: Plastics;  Laterality: Left;  . APPLICATION OF WOUND VAC Left 12/08/2015   Procedure: APPLICATION OF WOUND VAC  AND CAST to knee;  Surgeon: Loel Lofty [name], DO;  Location: [place];  Service: Plastics;  Laterality: Left;  . COLONOSCOPY    . CORONARY ANGIOPLASTY  07   3 stents placed  . ESOPHAGOGASTRODUODENOSCOPY    . EYE SURGERY Bilateral    cataracts  . FREE FLAP TO EXTREMITY Left 11/16/2015   Procedure: FREE FLAP TO EXTREMITY;  Surgeon: Loel Lofty [name], DO;  Location: [place];  Service: Clinical cytogeneticist;  Laterality: Left;  . I & D EXTREMITY Left 10/18/2015   Procedure: Left Knee Washout, Reclosure;  Surgeon: Newt Minion, MD;  Location: [place];  Service: Orthopedics;  Laterality: Left;  . I & D EXTREMITY Left 12/08/2015   Procedure: IRRIGATION AND DEBRIDEMENT knee;  Surgeon: Loel Lofty [name], DO;  Location: [place];  Service: Plastics;  Laterality: Left;  . I & D KNEE WITH POLY EXCHANGE Left 10/12/2015   Procedure: Irrigation and Debridement , Poly Exchange, Antibiotic Beads Left Knee;  Surgeon: Newt Minion, MD;  Location: [place];  Service: Orthopedics;  Laterality: Left;  . INCISION AND DRAINAGE OF WOUND Left 01/04/2016   Procedure: DEBRIDEMENT OF LEFT KNEE WOUND WITH ACELL AND WOUND VAC PLACEMENT;  Surgeon: Loel Lofty [name], DO;  Location: [place];  Service: Plastics;  Laterality: Left;  . IR GENERIC HISTORICAL  05/11/2016   IR FLUORO GUIDE CV MIDLINE PICC RIGHT 05/11/2016 Sandi Mariscal, MD [facility]INTER[facility]  . IR GENERIC HISTORICAL  05/11/2016   IR US GUIDE VASC ACCESS RIGHT 05/11/2016 Sandi Mariscal, MD [facility]  . KNEE CLOSED REDUCTION Left 01/16/2017   Procedure: CLOSED MANIPULATION LEFT KNEE;  Surgeon: Leandrew Koyanagi, MD;  Location: [place];  Service: Orthopedics;  Laterality: Left;  . KNEE SURGERY     denies  . SKIN SPLIT GRAFT Left 11/16/2015   Procedure: LEFT GASTROC MUSCLE FLAP WITH SKIN GRAFT SPLIT THICKNESS AND VAC PLACEMENT;  Surgeon: Loel Lofty [name], DO;  Location: [place];  Service: Plastics;  Laterality: Left;  . TOTAL HIP ARTHROPLASTY Right 09/10/2012   Dr Sharol Given  . TOTAL HIP ARTHROPLASTY Right 09/10/2012   Procedure: TOTAL HIP ARTHROPLASTY;  Surgeon: Newt Minion, MD;  Location: [place];  Service: Orthopedics;  Laterality: Right;  Right Total Hip Arthroplasty  . TOTAL HIP ARTHROPLASTY Left 06/27/2016   Procedure: LEFT TOTAL HIP ARTHROPLASTY ANTERIOR APPROACH;  Surgeon: Leandrew Koyanagi, MD;  Location: [place];  Service: Orthopedics;  Laterality: Left;  . TOTAL KNEE ARTHROPLASTY Left 09/28/2015   Procedure: TOTAL KNEE ARTHROPLASTY;  Surgeon: Newt Minion, MD;  Location: [place];  Service: Orthopedics;  Laterality: Left;    MEDICATIONS: . amLODipine (NORVASC) 10 MG tablet  . aspirin EC 81 MG tablet  . atorvastatin (LIPITOR) 40 MG tablet  . CALCIUM PO  . carvedilol (COREG) 25 MG tablet  . doxycycline (VIBRA-TABS) 100 MG tablet  . furosemide (LASIX) 20 MG tablet  . hydrALAZINE (APRESOLINE) 25 MG tablet  . lisinopril (ZESTRIL) 40 MG tablet  . Magnesium Oxide (MAG-OXIDE) 200 MG TABS  . Multiple Vitamin (MULITIVITAMIN WITH MINERALS) TABS  . naproxen sodium (ALEVE) 220 MG tablet  . potassium chloride SA (KLOR-CON M20) 20 MEQ tablet  . Probiotic Product (PROBIOTIC DAILY PO)  . rifampin (RIFADIN) 300 MG capsule  . traMADol (ULTRAM) 50 MG tablet   No current facility-administered medications for this encounter.    Myra Gianotti, PA-C Surgical  Short Stay/Anesthesiology [facility] Phone [phone] [facility] Phone [phone] 06/30/2020 3:17 PM

## 2020-07-01 ENCOUNTER — Other Ambulatory Visit (HOSPITAL_COMMUNITY)
Admission: RE | Admit: 2020-07-01 | Discharge: 2020-07-01 | Disposition: A | Discharge status: Home / Self Care | Payer: Medicare Other | Source: Ambulatory Visit | Attending: Orthopaedic Surgery | Admitting: Orthopaedic Surgery

## 2020-07-01 DIAGNOSIS — Z20822 Contact with and (suspected) exposure to covid-19: Secondary | ICD-10-CM | POA: Diagnosis not present

## 2020-07-01 DIAGNOSIS — Z01812 Encounter for preprocedural laboratory examination: Secondary | ICD-10-CM | POA: Diagnosis not present

## 2020-07-01 LAB — SARS CORONAVIRUS 2 (TAT 6-24 HRS): SARS Coronavirus 2: NEGATIVE

## 2020-07-04 ENCOUNTER — Telehealth: Payer: Self-pay | Admitting: *Deleted

## 2020-07-04 NOTE — Telephone Encounter (Signed)
Ortho bundle pre-op call completed. 

## 2020-07-04 NOTE — Care Plan (Signed)
RNCM call to patient to discuss her upcoming Right total knee arthroplasty with Dr. Ninfa Linden. She is an Ortho bundle patient through [facility] and is agreeable to CM. She has all DME needed (FWW, 3in1/BSC, shower chair, cane). She states she lives alone and would like to go to [place] after discharge for STR. CM did reach out to Intake SW at [facility] and made aware of client's wishes. HHPT will be on stand-by if plan changes. Will update TOC team in hospital upon admission of SNF FL2 needed. Will continue to follow for needs.

## 2020-07-04 NOTE — H&P (Signed)
TOTAL KNEE ADMISSION H&P  Patient is being admitted for right total knee arthroplasty.  Subjective:  Chief Complaint:right knee pain.  HPI: [name], 79 y.o. female, has a history of pain and functional disability in the right knee due to arthritis and has failed non-surgical conservative treatments for greater than 12 weeks to includeNSAID's and/or analgesics, corticosteriod injections, viscosupplementation injections, flexibility and strengthening excercises, supervised PT with diminished ADL's post treatment, use of assistive devices and activity modification.  Onset of symptoms was gradual, starting 5 years ago with gradually worsening course since that time. The patient noted no past surgery on the right knee(s).  Patient currently rates pain in the right knee(s) at 10 out of 10 with activity. Patient has night pain, worsening of pain with activity and weight bearing, pain that interferes with activities of daily living, pain with passive range of motion, crepitus and joint swelling.  Patient has evidence of subchondral sclerosis, periarticular osteophytes and joint space narrowing by imaging studies. There is no active infection.  Patient Active Problem List   Diagnosis Date Noted  . Syncope 04/01/2019  . Closed fracture of multiple pubic rami ([place]) 07/22/2018  . Closed fracture of right superior rim of pubis ([place])   . Lateral dislocation of left patella 01/15/2017  . Unilateral primary osteoarthritis, right knee 01/15/2017  . Osteoporosis 11/26/2016  . Acute pain of left knee 08/27/2016  . Chronic diarrhea 08/07/2016  . Left displaced femoral neck fracture ([place]) 06/26/2016  . MRSA (methicillin resistant Staphylococcus aureus) infection 06/26/2016  . Hypertensive heart disease   . Hydronephrosis   . Obstructive uropathy   . Abnormal echocardiogram 01/23/2016  . Pressure ulcer 01/21/2016  . Chronic infection of prosthetic knee ([place]) 10/12/2015  . AAA 01/19/2009  . ILIAC  ARTERY ANEURYSM 01/19/2009  . Hyperlipidemia 01/14/2009  . TOBACCO ABUSE 01/14/2009  . Essential hypertension 01/14/2009  . Coronary atherosclerosis 01/14/2009  . Cerebrovascular disease 01/14/2009  . Peripheral vascular disease ([place]) 01/14/2009  . CHRONIC OBSTRUCTIVE PULMONARY DISEASE 01/14/2009   Past Medical History:  Diagnosis Date  . Arthritis   . CAD (coronary artery disease)    a. 02/2007 s/p PCI/DES ot LCX and RCA;  b. 07/2014 MV: no ischemia/infarct, EF 68%.  . Carotid arterial disease ([place])    a. 05/2015 Carotid U/S: RICA 7-51%, LICA 02-58%, RECA 5-27%, LECA >50% - overall stable, f/u 1 yr.  . Complication of anesthesia    Three days after procedure pt statrted to suffer with memoy loss that took about 4-5 days to come back according to [name]."  . Dilated cardiomyopathy ([place])    a. 05/2005 Echo: EF 65%; b. 01/22/2016 Echo: EF 25-30% (in setting of admission for urosepsis w/ elev trop); c. 01/26/2016 Echo: EF 35-40%, inf, distal apical, apical, and mid to distal septal HK-->felt to be 2/2 stress induced CM.  [name] Heart murmur    mild AS 04/2020  . History of bronchitis   . Hyperlipidemia   . Hypertension   . Hypertensive heart disease   . Memory impairment   . MRSA (methicillin resistant staph aureus) culture positive   . Nephrolithiasis    a. 12/2015 UVJ stone w/ hydroureteronephrosis req nephrostomy tube placement.  . Osteoarthritis    a. 01/8241 s/p L TKA complicated by infections req skin grafting.  [name] PVD (peripheral vascular disease) ([place])    a. Bilat common iliac dzs, nl ABI's --> med rx.  . Sepsis ([place])    a. 12/2015 admitted with Proteus mirabilis  urosepsis/bacteremia.  . Wears glasses     Past Surgical History:  Procedure Laterality Date  . ABDOMINAL HYSTERECTOMY    . APPLICATION OF A-CELL OF EXTREMITY Left 12/08/2015   Procedure: APPLICATION OF A-CELL OF knee;  Surgeon: Loel Lofty [name], DO;  Location: [place];  Service: Plastics;  Laterality: Left;  .  APPLICATION OF WOUND VAC Left 11/16/2015   Procedure: APPLICATION OF WOUND VAC;  Surgeon: Loel Lofty [name], DO;  Location: [place];  Service: Plastics;  Laterality: Left;  . APPLICATION OF WOUND VAC Left 12/08/2015   Procedure: APPLICATION OF WOUND VAC  AND CAST to knee;  Surgeon: Loel Lofty [name], DO;  Location: [place];  Service: Plastics;  Laterality: Left;  . COLONOSCOPY    . CORONARY ANGIOPLASTY  07   3 stents placed  . ESOPHAGOGASTRODUODENOSCOPY    . EYE SURGERY Bilateral    cataracts  . FREE FLAP TO EXTREMITY Left 11/16/2015   Procedure: FREE FLAP TO EXTREMITY;  Surgeon: Loel Lofty [name], DO;  Location: [place];  Service: Plastics;  Laterality: Left;  . I & D EXTREMITY Left 10/18/2015   Procedure: Left Knee Washout, Reclosure;  Surgeon: Newt Minion, MD;  Location: [place];  Service: Orthopedics;  Laterality: Left;  . I & D EXTREMITY Left 12/08/2015   Procedure: IRRIGATION AND DEBRIDEMENT knee;  Surgeon: Loel Lofty [name], DO;  Location: [place];  Service: Plastics;  Laterality: Left;  . I & D KNEE WITH POLY EXCHANGE Left 10/12/2015   Procedure: Irrigation and Debridement , Poly Exchange, Antibiotic Beads Left Knee;  Surgeon: Newt Minion, MD;  Location: [place];  Service: Orthopedics;  Laterality: Left;  . INCISION AND DRAINAGE OF WOUND Left 01/04/2016   Procedure: DEBRIDEMENT OF LEFT KNEE WOUND WITH ACELL AND WOUND VAC PLACEMENT;  Surgeon: Loel Lofty [name], DO;  Location: [place];  Service: Plastics;  Laterality: Left;  . IR GENERIC HISTORICAL  05/11/2016   IR FLUORO GUIDE CV MIDLINE PICC RIGHT 05/11/2016 Sandi Mariscal, MD [facility]-INTER[facility] RAD  . IR GENERIC HISTORICAL  05/11/2016   IR US GUIDE VASC ACCESS RIGHT 05/11/2016 Sandi Mariscal, MD [facility]INTER[facility]  . KNEE CLOSED REDUCTION Left 01/16/2017   Procedure: CLOSED MANIPULATION LEFT KNEE;  Surgeon: Leandrew Koyanagi, MD;  Location: [place];  Service: Orthopedics;  Laterality: Left;  . KNEE SURGERY     denies  . SKIN SPLIT GRAFT Left  11/16/2015   Procedure: LEFT GASTROC MUSCLE FLAP WITH SKIN GRAFT SPLIT THICKNESS AND VAC PLACEMENT;  Surgeon: Loel Lofty [name], DO;  Location: [place];  Service: Plastics;  Laterality: Left;  . TOTAL HIP ARTHROPLASTY Right 09/10/2012   Dr Sharol Given  . TOTAL HIP ARTHROPLASTY Right 09/10/2012   Procedure: TOTAL HIP ARTHROPLASTY;  Surgeon: Newt Minion, MD;  Location: [place];  Service: Orthopedics;  Laterality: Right;  Right Total Hip Arthroplasty  . TOTAL HIP ARTHROPLASTY Left 06/27/2016   Procedure: LEFT TOTAL HIP ARTHROPLASTY ANTERIOR APPROACH;  Surgeon: Leandrew Koyanagi, MD;  Location: [place];  Service: Orthopedics;  Laterality: Left;  . TOTAL KNEE ARTHROPLASTY Left 09/28/2015   Procedure: TOTAL KNEE ARTHROPLASTY;  Surgeon: Newt Minion, MD;  Location: [place];  Service: Orthopedics;  Laterality: Left;    No current facility-administered medications for this encounter.   Current Outpatient Medications  Medication Sig Dispense Refill Last Dose  . amLODipine (NORVASC) 10 MG tablet Take 1 tablet (10 mg total) by mouth daily. 90 tablet 3   . aspirin EC  81 MG tablet Take 81 mg by mouth daily.     [name] atorvastatin (LIPITOR) 40 MG tablet Take 1 tablet (40 mg total) by mouth daily. 90 tablet 1   . CALCIUM PO Take 1 tablet by mouth daily.     . carvedilol (COREG) 25 MG tablet Take 1 tablet (25 mg total) by mouth 2 (two) times daily with a meal. 180 tablet 3   . doxycycline (VIBRA-TABS) 100 MG tablet Take 1 tablet (100 mg total) by mouth 2 (two) times daily. 60 tablet 6   . furosemide (LASIX) 20 MG tablet Take 1 tablet (20 mg total) by mouth every other day. 45 tablet 3   . hydrALAZINE (APRESOLINE) 25 MG tablet Take 1 tablet (25 mg total) by mouth every 8 (eight) hours. 270 tablet 3   . lisinopril (ZESTRIL) 40 MG tablet Take 1 tablet (40 mg total) by mouth daily. 90 tablet 3   . Magnesium Oxide (MAG-OXIDE) 200 MG TABS Take 200 mg by mouth every morning.      . Multiple Vitamin (MULITIVITAMIN WITH MINERALS) TABS Take  1 tablet by mouth daily.     . naproxen sodium (ALEVE) 220 MG tablet Take 440 mg by mouth daily as needed (pain).     . potassium chloride SA (KLOR-CON M20) 20 MEQ tablet Take 1 tablet (20 mEq total) by mouth daily. 90 tablet 3   . Probiotic Product (PROBIOTIC DAILY PO) Take 1 capsule by mouth daily.     . rifampin (RIFADIN) 300 MG capsule Take 1 capsule (300 mg total) by mouth 2 (two) times daily. 60 capsule 6   . traMADol (ULTRAM) 50 MG tablet Take 1-2 tablets (50-100 mg total) by mouth every 12 (twelve) hours as needed. (Patient taking differently: Take 50-100 mg by mouth every 12 (twelve) hours as needed for moderate pain. ) 30 tablet 0    No Known Allergies  Social History   Tobacco Use  . Smoking status: Former Smoker    Packs/day: 0.50    Years: 50.00    Pack years: 25.00    Types: Cigarettes    Quit date: 09/28/2015    Years since quitting: 4.7  . Smokeless tobacco: Never Used  . Tobacco comment: quit 09/27/15  Substance Use Topics  . Alcohol use: No    Family History  Problem Relation Age of Onset  . Breast cancer Mother   . Leukemia Father   . Stroke Sister   . Bone cancer Other      Review of Systems  Musculoskeletal: Positive for gait problem and joint swelling.  All other systems reviewed and are negative.   Objective:  Physical Exam Vitals reviewed.  Constitutional:      Appearance: Normal appearance.  HENT:     Head: Normocephalic and atraumatic.  Eyes:     Extraocular Movements: Extraocular movements intact.     Pupils: Pupils are equal, round, and reactive to light.  Cardiovascular:     Rate and Rhythm: Normal rate.     Pulses: Normal pulses.  Pulmonary:     Effort: Pulmonary effort is normal.  Abdominal:     Palpations: Abdomen is soft.  Musculoskeletal:     Cervical back: Normal range of motion.     Right knee: Effusion, bony tenderness and crepitus present. Decreased range of motion. Tenderness present over the medial joint line, lateral joint  line and patellar tendon. Abnormal alignment and abnormal meniscus.  Neurological:     Mental Status: She is alert  and oriented to person, place, and time.  Psychiatric:        Behavior: Behavior normal.     Vital signs in last 24 hours:    Labs:   Estimated body mass index is 19.5 kg/m as calculated from the following:   Height as of 06/29/20: 5\' 4"  (1.626 m).   Weight as of 06/29/20: 51.5 kg.   Imaging Review Plain radiographs demonstrate severe degenerative joint disease of the right knee(s). The overall alignment ismild varus. The bone quality appears to be good for age and reported activity level.      Assessment/Plan:  End stage arthritis, right knee   The patient history, physical examination, clinical judgment of the provider and imaging studies are consistent with end stage degenerative joint disease of the right knee(s) and total knee arthroplasty is deemed medically necessary. The treatment options including medical management, injection therapy arthroscopy and arthroplasty were discussed at length. The risks and benefits of total knee arthroplasty were presented and reviewed. The risks due to aseptic loosening, infection, stiffness, patella tracking problems, thromboembolic complications and other imponderables were discussed. The patient acknowledged the explanation, agreed to proceed with the plan and consent was signed. Patient is being admitted for inpatient treatment for surgery, pain control, PT, OT, prophylactic antibiotics, VTE prophylaxis, progressive ambulation and ADL's and discharge planning. The patient is planning to be discharged home with home health services

## 2020-07-05 ENCOUNTER — Other Ambulatory Visit: Payer: Self-pay

## 2020-07-05 ENCOUNTER — Ambulatory Visit (HOSPITAL_COMMUNITY): Payer: Medicare Other | Admitting: Vascular Surgery

## 2020-07-05 ENCOUNTER — Ambulatory Visit (HOSPITAL_COMMUNITY): Payer: Medicare Other | Admitting: Certified Registered Nurse Anesthetist

## 2020-07-05 ENCOUNTER — Observation Stay (HOSPITAL_COMMUNITY): Payer: Medicare Other

## 2020-07-05 ENCOUNTER — Inpatient Hospital Stay (HOSPITAL_COMMUNITY)
Admission: RE | Admit: 2020-07-05 | Discharge: 2020-07-07 | DRG: 470 | Disposition: A | Discharge status: Skilled Nursing Facility | Payer: Medicare Other | Attending: Orthopaedic Surgery | Admitting: Orthopaedic Surgery

## 2020-07-05 ENCOUNTER — Encounter (HOSPITAL_COMMUNITY): Payer: Self-pay | Admitting: Orthopaedic Surgery

## 2020-07-05 ENCOUNTER — Encounter (HOSPITAL_COMMUNITY)
Admission: RE | Disposition: A | Discharge status: Skilled Nursing Facility | Payer: Self-pay | Source: Home / Self Care | Attending: Orthopaedic Surgery

## 2020-07-05 DIAGNOSIS — I739 Peripheral vascular disease, unspecified: Secondary | ICD-10-CM | POA: Diagnosis present

## 2020-07-05 DIAGNOSIS — Z20822 Contact with and (suspected) exposure to covid-19: Secondary | ICD-10-CM | POA: Diagnosis not present

## 2020-07-05 DIAGNOSIS — Z7982 Long term (current) use of aspirin: Secondary | ICD-10-CM

## 2020-07-05 DIAGNOSIS — Z96652 Presence of left artificial knee joint: Secondary | ICD-10-CM | POA: Diagnosis present

## 2020-07-05 DIAGNOSIS — Z79899 Other long term (current) drug therapy: Secondary | ICD-10-CM

## 2020-07-05 DIAGNOSIS — Z8614 Personal history of Methicillin resistant Staphylococcus aureus infection: Secondary | ICD-10-CM | POA: Diagnosis not present

## 2020-07-05 DIAGNOSIS — Z955 Presence of coronary angioplasty implant and graft: Secondary | ICD-10-CM | POA: Diagnosis not present

## 2020-07-05 DIAGNOSIS — M1711 Unilateral primary osteoarthritis, right knee: Principal | ICD-10-CM | POA: Diagnosis present

## 2020-07-05 DIAGNOSIS — Z90710 Acquired absence of both cervix and uterus: Secondary | ICD-10-CM

## 2020-07-05 DIAGNOSIS — I2510 Atherosclerotic heart disease of native coronary artery without angina pectoris: Secondary | ICD-10-CM | POA: Diagnosis not present

## 2020-07-05 DIAGNOSIS — F0390 Unspecified dementia without behavioral disturbance: Secondary | ICD-10-CM | POA: Diagnosis present

## 2020-07-05 DIAGNOSIS — I10 Essential (primary) hypertension: Secondary | ICD-10-CM | POA: Diagnosis not present

## 2020-07-05 DIAGNOSIS — I119 Hypertensive heart disease without heart failure: Secondary | ICD-10-CM | POA: Diagnosis not present

## 2020-07-05 DIAGNOSIS — I714 Abdominal aortic aneurysm, without rupture: Secondary | ICD-10-CM | POA: Diagnosis not present

## 2020-07-05 DIAGNOSIS — Z96643 Presence of artificial hip joint, bilateral: Secondary | ICD-10-CM | POA: Diagnosis present

## 2020-07-05 DIAGNOSIS — J449 Chronic obstructive pulmonary disease, unspecified: Secondary | ICD-10-CM | POA: Diagnosis present

## 2020-07-05 DIAGNOSIS — I420 Dilated cardiomyopathy: Secondary | ICD-10-CM | POA: Diagnosis not present

## 2020-07-05 DIAGNOSIS — E785 Hyperlipidemia, unspecified: Secondary | ICD-10-CM | POA: Diagnosis present

## 2020-07-05 DIAGNOSIS — Z803 Family history of malignant neoplasm of breast: Secondary | ICD-10-CM

## 2020-07-05 DIAGNOSIS — Z87442 Personal history of urinary calculi: Secondary | ICD-10-CM

## 2020-07-05 DIAGNOSIS — G8918 Other acute postprocedural pain: Secondary | ICD-10-CM | POA: Diagnosis not present

## 2020-07-05 DIAGNOSIS — Z96651 Presence of right artificial knee joint: Secondary | ICD-10-CM | POA: Diagnosis not present

## 2020-07-05 DIAGNOSIS — M810 Age-related osteoporosis without current pathological fracture: Secondary | ICD-10-CM | POA: Diagnosis present

## 2020-07-05 DIAGNOSIS — Z806 Family history of leukemia: Secondary | ICD-10-CM

## 2020-07-05 DIAGNOSIS — Z823 Family history of stroke: Secondary | ICD-10-CM

## 2020-07-05 DIAGNOSIS — Z471 Aftercare following joint replacement surgery: Secondary | ICD-10-CM | POA: Diagnosis not present

## 2020-07-05 DIAGNOSIS — Z87891 Personal history of nicotine dependence: Secondary | ICD-10-CM

## 2020-07-05 HISTORY — PX: TOTAL KNEE ARTHROPLASTY: SHX125

## 2020-07-05 SURGERY — ARTHROPLASTY, KNEE, TOTAL
Anesthesia: Spinal | Site: Knee | Laterality: Right

## 2020-07-05 MED ORDER — PROPOFOL 1000 MG/100ML IV EMUL
INTRAVENOUS | Status: AC
  Filled 2020-07-05: qty 100

## 2020-07-05 MED ORDER — KETOROLAC TROMETHAMINE 15 MG/ML IJ SOLN
7.5000 mg | Freq: Four times a day (QID) | INTRAMUSCULAR | Status: AC
  Administered 2020-07-05 – 2020-07-06 (×4): 7.5 mg via INTRAVENOUS
  Filled 2020-07-05 (×4): qty 1

## 2020-07-05 MED ORDER — METHOCARBAMOL 500 MG PO TABS
500.0000 mg | ORAL_TABLET | Freq: Four times a day (QID) | ORAL | Status: DC | PRN
  Administered 2020-07-05 – 2020-07-06 (×3): 500 mg via ORAL
  Filled 2020-07-05 (×3): qty 1

## 2020-07-05 MED ORDER — TRANEXAMIC ACID 1000 MG/10ML IV SOLN
2000.0000 mg | INTRAVENOUS | Status: DC
  Filled 2020-07-05: qty 20

## 2020-07-05 MED ORDER — PHENOL 1.4 % MT LIQD: 1.0000 | OROMUCOSAL | Status: DC | PRN

## 2020-07-05 MED ORDER — BUPIVACAINE IN DEXTROSE 0.75-8.25 % IT SOLN
INTRATHECAL | Status: DC | PRN
  Administered 2020-07-05: 1.5 mL via INTRATHECAL

## 2020-07-05 MED ORDER — CHLORHEXIDINE GLUCONATE 0.12 % MT SOLN
15.0000 mL | Freq: Once | OROMUCOSAL | Status: AC
  Administered 2020-07-05: 15 mL via OROMUCOSAL
  Filled 2020-07-05: qty 15

## 2020-07-05 MED ORDER — DOCUSATE SODIUM 100 MG PO CAPS
100.0000 mg | ORAL_CAPSULE | Freq: Two times a day (BID) | ORAL | Status: DC
  Administered 2020-07-05 – 2020-07-07 (×4): 100 mg via ORAL
  Filled 2020-07-05 (×5): qty 1

## 2020-07-05 MED ORDER — POTASSIUM CHLORIDE CRYS ER 20 MEQ PO TBCR
20.0000 meq | EXTENDED_RELEASE_TABLET | Freq: Every day | ORAL | Status: DC
  Administered 2020-07-05 – 2020-07-07 (×3): 20 meq via ORAL
  Filled 2020-07-05 (×3): qty 1

## 2020-07-05 MED ORDER — SODIUM CHLORIDE 0.9 % IV SOLN: INTRAVENOUS | Status: DC

## 2020-07-05 MED ORDER — OXYCODONE HCL 5 MG PO TABS
5.0000 mg | ORAL_TABLET | ORAL | Status: DC | PRN
  Administered 2020-07-05 – 2020-07-06 (×2): 5 mg via ORAL
  Administered 2020-07-07: 10 mg via ORAL
  Filled 2020-07-05: qty 2
  Filled 2020-07-05 (×2): qty 1

## 2020-07-05 MED ORDER — ONDANSETRON HCL 4 MG/2ML IJ SOLN
INTRAMUSCULAR | Status: DC | PRN
  Administered 2020-07-05: 4 mg via INTRAVENOUS

## 2020-07-05 MED ORDER — SODIUM CHLORIDE 0.9 % IR SOLN
Status: DC | PRN
  Administered 2020-07-05: 3000 mL

## 2020-07-05 MED ORDER — FUROSEMIDE 20 MG PO TABS
20.0000 mg | ORAL_TABLET | ORAL | Status: DC
  Administered 2020-07-06: 20 mg via ORAL
  Filled 2020-07-05 (×2): qty 1

## 2020-07-05 MED ORDER — OXYCODONE HCL 5 MG PO TABS
ORAL_TABLET | ORAL | Status: AC
  Filled 2020-07-05: qty 1

## 2020-07-05 MED ORDER — FENTANYL CITRATE (PF) 100 MCG/2ML IJ SOLN: 25.0000 ug | INTRAMUSCULAR | Status: DC | PRN

## 2020-07-05 MED ORDER — ATORVASTATIN CALCIUM 40 MG PO TABS
40.0000 mg | ORAL_TABLET | Freq: Every day | ORAL | Status: DC
  Administered 2020-07-06 – 2020-07-07 (×2): 40 mg via ORAL
  Filled 2020-07-05 (×3): qty 1

## 2020-07-05 MED ORDER — EPHEDRINE SULFATE-NACL 50-0.9 MG/10ML-% IV SOSY
PREFILLED_SYRINGE | INTRAVENOUS | Status: DC | PRN
  Administered 2020-07-05: 5 mg via INTRAVENOUS

## 2020-07-05 MED ORDER — ONDANSETRON HCL 4 MG/2ML IJ SOLN: 4.0000 mg | Freq: Once | INTRAMUSCULAR | Status: DC | PRN

## 2020-07-05 MED ORDER — ADULT MULTIVITAMIN W/MINERALS CH
1.0000 | ORAL_TABLET | Freq: Every day | ORAL | Status: DC
  Administered 2020-07-05 – 2020-07-07 (×3): 1 via ORAL
  Filled 2020-07-05 (×3): qty 1

## 2020-07-05 MED ORDER — ONDANSETRON HCL 4 MG/2ML IJ SOLN
INTRAMUSCULAR | Status: AC
  Filled 2020-07-05: qty 2

## 2020-07-05 MED ORDER — CARVEDILOL 25 MG PO TABS
25.0000 mg | ORAL_TABLET | Freq: Two times a day (BID) | ORAL | Status: DC
  Administered 2020-07-05 – 2020-07-07 (×5): 25 mg via ORAL
  Filled 2020-07-05 (×5): qty 1

## 2020-07-05 MED ORDER — HYDROMORPHONE HCL 1 MG/ML IJ SOLN
0.5000 mg | INTRAMUSCULAR | Status: DC | PRN
  Administered 2020-07-05: 1 mg via INTRAVENOUS
  Filled 2020-07-05: qty 1

## 2020-07-05 MED ORDER — MAGNESIUM OXIDE 400 (241.3 MG) MG PO TABS
200.0000 mg | ORAL_TABLET | ORAL | Status: DC
  Administered 2020-07-06 – 2020-07-07 (×2): 200 mg via ORAL
  Filled 2020-07-05 (×3): qty 1

## 2020-07-05 MED ORDER — POVIDONE-IODINE 10 % EX SWAB
2.0000 "application " | Freq: Once | CUTANEOUS | Status: AC
  Administered 2020-07-05: 2 via TOPICAL

## 2020-07-05 MED ORDER — CEFAZOLIN SODIUM-DEXTROSE 1-4 GM/50ML-% IV SOLN
1.0000 g | Freq: Four times a day (QID) | INTRAVENOUS | Status: AC
  Administered 2020-07-05 (×2): 1 g via INTRAVENOUS
  Filled 2020-07-05 (×2): qty 50

## 2020-07-05 MED ORDER — PANTOPRAZOLE SODIUM 40 MG PO TBEC
40.0000 mg | DELAYED_RELEASE_TABLET | Freq: Every day | ORAL | Status: DC
  Administered 2020-07-05 – 2020-07-07 (×3): 40 mg via ORAL
  Filled 2020-07-05 (×3): qty 1

## 2020-07-05 MED ORDER — FENTANYL CITRATE (PF) 100 MCG/2ML IJ SOLN
INTRAMUSCULAR | Status: DC | PRN
  Administered 2020-07-05: 50 ug via INTRAVENOUS

## 2020-07-05 MED ORDER — METOCLOPRAMIDE HCL 5 MG PO TABS: 5.0000 mg | ORAL_TABLET | Freq: Three times a day (TID) | ORAL | Status: DC | PRN

## 2020-07-05 MED ORDER — FENTANYL CITRATE (PF) 250 MCG/5ML IJ SOLN
INTRAMUSCULAR | Status: AC
  Filled 2020-07-05: qty 5

## 2020-07-05 MED ORDER — ORAL CARE MOUTH RINSE: 15.0000 mL | Freq: Once | OROMUCOSAL | Status: AC

## 2020-07-05 MED ORDER — METOCLOPRAMIDE HCL 5 MG/ML IJ SOLN: 5.0000 mg | Freq: Three times a day (TID) | INTRAMUSCULAR | Status: DC | PRN

## 2020-07-05 MED ORDER — CEFAZOLIN SODIUM-DEXTROSE 2-4 GM/100ML-% IV SOLN
2.0000 g | INTRAVENOUS | Status: AC
  Administered 2020-07-05: 2 g via INTRAVENOUS
  Filled 2020-07-05: qty 100

## 2020-07-05 MED ORDER — PROPOFOL 10 MG/ML IV BOLUS
INTRAVENOUS | Status: DC | PRN
  Administered 2020-07-05 (×4): 10 mg via INTRAVENOUS

## 2020-07-05 MED ORDER — ACETAMINOPHEN 325 MG PO TABS
325.0000 mg | ORAL_TABLET | Freq: Four times a day (QID) | ORAL | Status: DC | PRN
  Administered 2020-07-05 – 2020-07-07 (×4): 650 mg via ORAL
  Filled 2020-07-05 (×4): qty 2

## 2020-07-05 MED ORDER — OXYCODONE HCL 5 MG/5ML PO SOLN: 5.0000 mg | Freq: Once | ORAL | Status: AC | PRN

## 2020-07-05 MED ORDER — BUPIVACAINE-EPINEPHRINE (PF) 0.5% -1:200000 IJ SOLN
INTRAMUSCULAR | Status: DC | PRN
  Administered 2020-07-05: 20 mL via PERINEURAL

## 2020-07-05 MED ORDER — ONDANSETRON HCL 4 MG/2ML IJ SOLN: 4.0000 mg | Freq: Four times a day (QID) | INTRAMUSCULAR | Status: DC | PRN

## 2020-07-05 MED ORDER — RIFAMPIN 300 MG PO CAPS
300.0000 mg | ORAL_CAPSULE | Freq: Two times a day (BID) | ORAL | Status: DC
  Administered 2020-07-05 – 2020-07-07 (×5): 300 mg via ORAL
  Filled 2020-07-05 (×6): qty 1

## 2020-07-05 MED ORDER — DIPHENHYDRAMINE HCL 12.5 MG/5ML PO ELIX: 12.5000 mg | ORAL_SOLUTION | ORAL | Status: DC | PRN

## 2020-07-05 MED ORDER — EPHEDRINE 5 MG/ML INJ
INTRAVENOUS | Status: AC
  Filled 2020-07-05: qty 10

## 2020-07-05 MED ORDER — OXYCODONE HCL 5 MG PO TABS
10.0000 mg | ORAL_TABLET | ORAL | Status: DC | PRN
  Administered 2020-07-05 – 2020-07-07 (×2): 10 mg via ORAL
  Filled 2020-07-05 (×2): qty 2

## 2020-07-05 MED ORDER — DOXYCYCLINE HYCLATE 100 MG PO TABS
100.0000 mg | ORAL_TABLET | Freq: Two times a day (BID) | ORAL | Status: DC
  Administered 2020-07-05 – 2020-07-07 (×5): 100 mg via ORAL
  Filled 2020-07-05 (×5): qty 1

## 2020-07-05 MED ORDER — PHENYLEPHRINE 40 MCG/ML (10ML) SYRINGE FOR IV PUSH (FOR BLOOD PRESSURE SUPPORT): PREFILLED_SYRINGE | INTRAVENOUS | Status: DC | PRN

## 2020-07-05 MED ORDER — 0.9 % SODIUM CHLORIDE (POUR BTL) OPTIME
TOPICAL | Status: DC | PRN
  Administered 2020-07-05: 1000 mL

## 2020-07-05 MED ORDER — ALUM & MAG HYDROXIDE-SIMETH 200-200-20 MG/5ML PO SUSP: 30.0000 mL | ORAL | Status: DC | PRN

## 2020-07-05 MED ORDER — PROPOFOL 500 MG/50ML IV EMUL
INTRAVENOUS | Status: DC | PRN
  Administered 2020-07-05: 50 ug/kg/min via INTRAVENOUS

## 2020-07-05 MED ORDER — TRANEXAMIC ACID-NACL 1000-0.7 MG/100ML-% IV SOLN
1000.0000 mg | INTRAVENOUS | Status: AC
  Administered 2020-07-05: 1000 mg via INTRAVENOUS
  Filled 2020-07-05: qty 100

## 2020-07-05 MED ORDER — HYDRALAZINE HCL 25 MG PO TABS
25.0000 mg | ORAL_TABLET | Freq: Three times a day (TID) | ORAL | Status: DC
  Administered 2020-07-05 – 2020-07-07 (×6): 25 mg via ORAL
  Filled 2020-07-05 (×6): qty 1

## 2020-07-05 MED ORDER — PHENYLEPHRINE HCL-NACL 10-0.9 MG/250ML-% IV SOLN
INTRAVENOUS | Status: DC | PRN
  Administered 2020-07-05: 25 ug/min via INTRAVENOUS

## 2020-07-05 MED ORDER — ASPIRIN EC 325 MG PO TBEC
325.0000 mg | DELAYED_RELEASE_TABLET | Freq: Two times a day (BID) | ORAL | Status: DC
  Administered 2020-07-05 – 2020-07-07 (×5): 325 mg via ORAL
  Filled 2020-07-05 (×6): qty 1

## 2020-07-05 MED ORDER — METHOCARBAMOL 1000 MG/10ML IJ SOLN
500.0000 mg | Freq: Four times a day (QID) | INTRAVENOUS | Status: DC | PRN
  Filled 2020-07-05: qty 5

## 2020-07-05 MED ORDER — OXYCODONE HCL 5 MG PO TABS
5.0000 mg | ORAL_TABLET | Freq: Once | ORAL | Status: AC | PRN
  Administered 2020-07-05: 5 mg via ORAL

## 2020-07-05 MED ORDER — ONDANSETRON HCL 4 MG PO TABS: 4.0000 mg | ORAL_TABLET | Freq: Four times a day (QID) | ORAL | Status: DC | PRN

## 2020-07-05 MED ORDER — MENTHOL 3 MG MT LOZG: 1.0000 | LOZENGE | OROMUCOSAL | Status: DC | PRN

## 2020-07-05 MED ORDER — LACTATED RINGERS IV SOLN: INTRAVENOUS | Status: DC

## 2020-07-05 MED ORDER — AMLODIPINE BESYLATE 10 MG PO TABS
10.0000 mg | ORAL_TABLET | Freq: Every day | ORAL | Status: DC
  Administered 2020-07-06 – 2020-07-07 (×2): 10 mg via ORAL
  Filled 2020-07-05 (×2): qty 1

## 2020-07-05 MED ORDER — POLYETHYLENE GLYCOL 3350 17 G PO PACK: 17.0000 g | PACK | Freq: Every day | ORAL | Status: DC | PRN

## 2020-07-05 SURGICAL SUPPLY — 75 items
BANDAGE ESMARK 6X9 LF (GAUZE/BANDAGES/DRESSINGS) ×1 IMPLANT
BASEPLATE TIBIAL TRIATHALON 3 (Plate) ×2 IMPLANT
BEARIN INSERT TIBIAL SZ 3 11 (Insert) ×3 IMPLANT
BEARING INSERT TIBIAL SZ 3 11 (Insert) IMPLANT
BLADE SAG 18X100X1.27 (BLADE) ×3 IMPLANT
BNDG CMPR 9X6 STRL LF SNTH (GAUZE/BANDAGES/DRESSINGS) ×1
BNDG CMPR MED 10X6 ELC LF (GAUZE/BANDAGES/DRESSINGS) ×1
BNDG ELASTIC 6X10 VLCR STRL LF (GAUZE/BANDAGES/DRESSINGS) ×2 IMPLANT
BNDG ELASTIC 6X5.8 VLCR STR LF (GAUZE/BANDAGES/DRESSINGS) ×6 IMPLANT
BNDG ESMARK 6X9 LF (GAUZE/BANDAGES/DRESSINGS) ×3
BOWL SMART MIX CTS (DISPOSABLE) ×3 IMPLANT
BSPLAT TIB 3 CMNT PRM STRL KN (Plate) ×1 IMPLANT
CEMENT BONE SIMPLEX SPEEDSET (Cement) ×4 IMPLANT
CLOSURE WOUND 1/2 X4 (GAUZE/BANDAGES/DRESSINGS)
COVER SURGICAL LIGHT HANDLE (MISCELLANEOUS) ×3 IMPLANT
COVER WAND RF STERILE (DRAPES) ×3 IMPLANT
CUFF TOURN SGL QUICK 24 (TOURNIQUET CUFF) ×3
CUFF TOURN SGL QUICK 34 (TOURNIQUET CUFF) ×3
CUFF TRNQT CYL 24X4X16.5-23 (TOURNIQUET CUFF) IMPLANT
CUFF TRNQT CYL 34X4.125X (TOURNIQUET CUFF) ×1 IMPLANT
DRAPE EXTREMITY T 121X128X90 (DISPOSABLE) ×3 IMPLANT
DRAPE HALF SHEET 40X57 (DRAPES) ×3 IMPLANT
DRAPE U-SHAPE 47X51 STRL (DRAPES) ×3 IMPLANT
DRSG PAD ABDOMINAL 8X10 ST (GAUZE/BANDAGES/DRESSINGS) ×3 IMPLANT
DURAPREP 26ML APPLICATOR (WOUND CARE) ×3 IMPLANT
ELECT CAUTERY BLADE 6.4 (BLADE) ×3 IMPLANT
ELECT REM PT RETURN 9FT ADLT (ELECTROSURGICAL) ×3
ELECTRODE REM PT RTRN 9FT ADLT (ELECTROSURGICAL) ×1 IMPLANT
FACESHIELD WRAPAROUND (MASK) ×6 IMPLANT
FACESHIELD WRAPAROUND OR TEAM (MASK) ×2 IMPLANT
FEMORAL PEG DISTAL FIXATION (Orthopedic Implant) ×2 IMPLANT
FEMORAL POST STABILIZED NO 3 (Orthopedic Implant) ×2 IMPLANT
GAUZE SPONGE 4X4 12PLY STRL (GAUZE/BANDAGES/DRESSINGS) ×3 IMPLANT
GAUZE XEROFORM 1X8 LF (GAUZE/BANDAGES/DRESSINGS) ×3 IMPLANT
GAUZE XEROFORM 5X9 LF (GAUZE/BANDAGES/DRESSINGS) ×2 IMPLANT
GLOVE BIOGEL PI IND STRL 8 (GLOVE) ×2 IMPLANT
GLOVE BIOGEL PI INDICATOR 8 (GLOVE) ×4
GLOVE ORTHO TXT STRL SZ7.5 (GLOVE) ×3 IMPLANT
GLOVE SURG ORTHO 8.0 STRL STRW (GLOVE) ×3 IMPLANT
GOWN STRL REUS W/ TWL LRG LVL3 (GOWN DISPOSABLE) IMPLANT
GOWN STRL REUS W/ TWL XL LVL3 (GOWN DISPOSABLE) ×2 IMPLANT
GOWN STRL REUS W/TWL LRG LVL3 (GOWN DISPOSABLE)
GOWN STRL REUS W/TWL XL LVL3 (GOWN DISPOSABLE) ×6
HANDPIECE INTERPULSE COAX TIP (DISPOSABLE) ×3
IMMOBILIZER KNEE 22 UNIV (SOFTGOODS) ×3 IMPLANT
KIT BASIN OR (CUSTOM PROCEDURE TRAY) ×3 IMPLANT
KIT TURNOVER KIT B (KITS) ×3 IMPLANT
MANIFOLD NEPTUNE II (INSTRUMENTS) ×3 IMPLANT
NDL 18GX1X1/2 (RX/OR ONLY) (NEEDLE) IMPLANT
NEEDLE 18GX1X1/2 (RX/OR ONLY) (NEEDLE) ×3 IMPLANT
NS IRRIG 1000ML POUR BTL (IV SOLUTION) ×3 IMPLANT
PACK TOTAL JOINT (CUSTOM PROCEDURE TRAY) ×3 IMPLANT
PAD ABD 8X10 STRL (GAUZE/BANDAGES/DRESSINGS) ×2 IMPLANT
PAD ARMBOARD 7.5X6 YLW CONV (MISCELLANEOUS) ×3 IMPLANT
PADDING CAST COTTON 6X4 STRL (CAST SUPPLIES) ×3 IMPLANT
PATELLA TRIATHALON (Knees) ×2 IMPLANT
PIN FLUTED HEDLESS FIX 3.5X1/8 (PIN) ×2 IMPLANT
SET HNDPC FAN SPRY TIP SCT (DISPOSABLE) ×1 IMPLANT
SET PAD KNEE POSITIONER (MISCELLANEOUS) ×3 IMPLANT
STAPLER VISISTAT 35W (STAPLE) ×2 IMPLANT
STRIP CLOSURE SKIN 1/2X4 (GAUZE/BANDAGES/DRESSINGS) IMPLANT
SUCTION FRAZIER HANDLE 10FR (MISCELLANEOUS) ×3
SUCTION TUBE FRAZIER 10FR DISP (MISCELLANEOUS) ×1 IMPLANT
SUT MNCRL AB 4-0 PS2 18 (SUTURE) ×3 IMPLANT
SUT VIC AB 0 CT1 27 (SUTURE) ×3
SUT VIC AB 0 CT1 27XBRD ANBCTR (SUTURE) ×1 IMPLANT
SUT VIC AB 1 CT1 27 (SUTURE) ×6
SUT VIC AB 1 CT1 27XBRD ANBCTR (SUTURE) ×2 IMPLANT
SUT VIC AB 2-0 CT1 27 (SUTURE) ×6
SUT VIC AB 2-0 CT1 TAPERPNT 27 (SUTURE) ×2 IMPLANT
SYR 50ML LL SCALE MARK (SYRINGE) ×2 IMPLANT
TOWEL GREEN STERILE (TOWEL DISPOSABLE) ×3 IMPLANT
TOWEL GREEN STERILE FF (TOWEL DISPOSABLE) ×3 IMPLANT
TRAY CATH 16FR W/PLASTIC CATH (SET/KITS/TRAYS/PACK) ×2 IMPLANT
WRAP KNEE MAXI GEL POST OP (GAUZE/BANDAGES/DRESSINGS) ×3 IMPLANT

## 2020-07-05 NOTE — Op Note (Signed)
NAME: [name], [name] MEDICAL RECORD RA:[id number] ACCOUNT [id number] DATE OF BIRTH:1941/05/01 FACILITY: [facility] LOCATION: [facility] PHYSICIAN:Alwaleed Obeso Kerry [place], MD  OPERATIVE REPORT  DATE OF PROCEDURE:  07/05/2020  PREOPERATIVE DIAGNOSIS:  Primary osteoarthritis and degenerative joint disease, right knee.  POSTOPERATIVE DIAGNOSIS:  Primary osteoarthritis and degenerative joint disease, right knee.  PROCEDURE:  Right total knee arthroplasty.  IMPLANTS:  Stryker Triathlon cemented knee system with size 3 femur, size 3 tibial tray, 11 mm fixed bearing thickness polyethylene insert, size 29 patellar button.  SURGEON:  Lind Guest. Ninfa Linden, MD  ASSISTANT:  Benita Stabile, PA-C  ANESTHESIA: 1.  Right lower extremity adductor canal block. 2.  Spinal.  TOURNIQUET TIME:  Under 1 hour.  ESTIMATED BLOOD LOSS:  Less  than 100 mL.  ANTIBIOTICS:  2 grams IV Ancef.  COMPLICATIONS:  None.  INDICATIONS:  The patient is a 79 year old female, well known to me.  She has debilitating arthritis of her right knee with valgus malalignment.  Her x-rays show bone-on-bone wear, and at this point, her right knee arthritis and pain are detrimentally  affecting her mobility, her quality of life, and her activities of daily living to the point she does wish to proceed with total knee arthroplasty.  She has had a previous left total hip arthroplasty, went well.  She has a very remote history of a left  total knee arthroplasty.  It did become infected and required numerous surgeries.  She is on chronic antibiotics due to that.  We discussed in detail her heightened risk of infection due to this, but given the fact that her pain has become so bad, her  and her family got together with me and we all decided that this would be appropriate to try a knee replacement surgery on her right knee.  We had a long and thorough discussion about the risk of acute blood loss anemia, nerve or vessel injury, fracture,   infection, DVT, and implant failure.  We talked about our goals being decreased pain, improved mobility, and overall improved quality of life.  DESCRIPTION OF PROCEDURE:  After informed consent was obtained, appropriate right knee was marked.  She was brought to the operating room after an adductor canal block was obtained of the right lower extremity in the holding room.  In the operating room,  she was placed supine on the operating table and sat up where spinal anesthesia was obtained.  She was laid back in supine position.  A Foley catheter was placed.  A nonsterile tourniquet was placed around her upper right thigh.  Her right thigh, knee,  leg, ankle, and foot were prepped and draped with DuraPrep and sterile drapes including a sterile stockinette.  Timeout was called.  She was identified as correct patient, correct right knee.  We then used an Esmarch to wrap that leg and tourniquet  inflated to 250 mm of pressure.  I then made a direct midline incision over the patella and carried this slightly proximally and distally.  We dissected down the knee joint and carried out a medial parapatellar arthrotomy finding a moderate joint  effusion.  With the knee in a flexed position, you could see that she did have more valgus alignment with complete loss of cartilage of the lateral compartment of her knee and significant loss of cartilage of the medial aspect.  There were osteophytes in  all 3 compartments.  We removed as much osteophytes as we could as well as remnants of ACL, PCL, medial and lateral meniscus.  Using extramedullary cutting guide, we made our proximal tibia cut setting the guide for taking 9 mm off the high side,  correcting varus and valgus and neutral slope.  We made this cut without difficulty.  Due to slight flexion contracture, we put our distal femoral cutting guide for a 10 mm distal femoral cut at 5 degrees externally rotated for right knee.  We did this  with an intramedullary guide  through the notch of the femur.  We made that cut without difficulty and brought the knee back down to full extension and with a 9 mm extension block achieved full extension.  We then went back to the femur and put our  femoral sizing guide based off the epicondylar axis and Whitesides line.  Based off this, we chose a size 3 femur.  We put a 4-in-1 cutting block for a size 3 femur.  We made our anterior and posterior cuts followed by our chamfer cuts.  We then made our  femoral box cut.  Attention was then turned back to the tibia, which was a size 3 tibial tray for coverage, and with the knee in a flexed position, I was able to place our tibial tray, and we felt it was the appropriate rotated position slightly  external based off the tibial tubercle and the femur.  We made our keel punch off of this.  With a size 3 trial tibia and a 3 trial right femur, we trialed a 9 mm fixed bearing polyethylene insert.  I was pleased with that insert, but I felt like the  final insert should probably have an 11 mm thickness due to stability purposes.  I then made our patellar cut and drilled 3 holes for a central 29 patellar button.  With all trial instrumentation in place, I brought the knee through several cycles of  motion.  We were pleased with motion and stability of the knee.  We then removed all instrumentation from the knee and irrigated the knee with normal saline solution using pulsatile lavage.  With the knee in a flexed position, we dried it well and mixed  our cement.  We then cemented our real Stryker Triathlon tibial tray size 3 followed by real size 3 right femur.  We placed our real 11 mm fixed bearing polyethylene insert and cemented our patellar button.  We then held the knee compressed in a fully  extended position.  Once the cement had hardened, we let the tourniquet down.  Hemostasis was obtained with electrocautery.  We irrigated the knee again with normal saline solution and then closed our  arthrotomy with interrupted #1 Vicryl suture followed  by 0 Vicryl to close the deep tissue and 2-0 Vicryl to close the subcutaneous tissue.  The skin was reapproximated with staples.  Xeroform well-padded sterile dressing was applied.  The patient was taken to recovery room in stable condition with all  final counts being correct and no complications noted.  Of note, Benita Stabile, PA-C, did assist during the entire case and assistance was crucial for facilitating all aspects of this procedure.  IN/NUANCE  D:07/05/2020 T:07/05/2020 JOB:[id number]

## 2020-07-05 NOTE — Anesthesia Postprocedure Evaluation (Signed)
Anesthesia Post Note  Patient: [name]  Procedure(s) Performed: RIGHT TOTAL KNEE ARTHROPLASTY (Right Knee)     Patient location during evaluation: PACU Anesthesia Type: Spinal Level of consciousness: awake and alert Pain management: pain level controlled Vital Signs Assessment: post-procedure vital signs reviewed and stable Respiratory status: spontaneous breathing and respiratory function stable Cardiovascular status: blood pressure returned to baseline and stable Postop Assessment: spinal receding and no apparent nausea or vomiting Anesthetic complications: no   No complications documented.  Last Vitals:  Vitals:   07/05/20 1017 07/05/20 1018  BP:  (!) 147/62  Pulse:  60  Resp:  (!) 23  Temp: (!) 36 C   SpO2:  96%    Last Pain:  Vitals:   07/05/20 1018  TempSrc:   PainSc: 0-No pain                 Audry Pili

## 2020-07-05 NOTE — Progress Notes (Signed)
Received pt from PACU accompanied by staff. Pt alert/oriented in no apparent distress. S/P right total knee arthroplasty.CDI, Pt situated/orientated to room/equiipments. Welcome guide/menu provided with instructions/ Pt/son verbalized understanding of instructions. Valuables policy has been discussed with no complaints voiced. Bed in lowest position with 3 side rails up and call bell/room phone within reach and all wheels lockied.

## 2020-07-05 NOTE — Transfer of Care (Addendum)
Immediate Anesthesia Transfer of Care Note  Patient: [name]  Procedure(s) Performed: RIGHT TOTAL KNEE ARTHROPLASTY (Right Knee)  Patient Location: PACU  Anesthesia Type:Spinal  Level of Consciousness: awake, alert  and oriented  Airway & Oxygen Therapy: Patient Spontanous Breathing and Patient connected to face mask oxygen  Post-op Assessment: Report given to RN and Post -op Vital signs reviewed and stable  Post vital signs: Reviewed and stable  Last Vitals:  Vitals Value Taken Time  BP 109/33 07/05/20 0935  Temp 36 C 07/05/20 0919  Pulse 57 07/05/20 0940  Resp 12 07/05/20 0940  SpO2 93 % 07/05/20 0940  Vitals shown include unvalidated device data.  Last Pain:  Vitals:   07/05/20 0935  TempSrc:   PainSc: 0-No pain      Patients Stated Pain Goal: 3 (66/44/03 4742)  Complications: No complications documented.

## 2020-07-05 NOTE — Anesthesia Procedure Notes (Signed)
Anesthesia Regional Block: Adductor canal block   Pre-Anesthetic Checklist: ,, timeout performed, Correct Patient, Correct Site, Correct Laterality, Correct Procedure, Correct Position, site marked, Risks and benefits discussed,  Surgical consent,  Pre-op evaluation,  At surgeon's request and post-op pain management  Laterality: Right  Prep: chloraprep       Needles:  Injection technique: Single-shot  Needle Type: Echogenic Needle     Needle Length: 10cm  Needle Gauge: 21     Additional Needles:   Narrative:  Start time: 07/05/2020 7:16 AM End time: 07/05/2020 7:19 AM Injection made incrementally with aspirations every 5 mL.  Performed by: Personally  Anesthesiologist: Audry Pili, MD  Additional Notes: No pain on injection. No increased resistance to injection. Injection made in 5cc increments. Good needle visualization. Patient tolerated the procedure well.

## 2020-07-05 NOTE — Discharge Instructions (Signed)

## 2020-07-05 NOTE — Anesthesia Procedure Notes (Signed)
Spinal  Patient location during procedure: OR Start time: 07/05/2020 7:33 AM End time: 07/05/2020 7:37 AM Staffing Performed: anesthesiologist  Anesthesiologist: Audry Pili, MD Preanesthetic Checklist Completed: patient identified, IV checked, risks and benefits discussed, surgical consent, monitors and equipment checked, pre-op evaluation and timeout performed Spinal Block Patient position: sitting Prep: DuraPrep Patient monitoring: heart rate, cardiac monitor, continuous pulse ox and blood pressure Approach: midline Location: L2-3 Injection technique: single-shot Needle Needle type: Pencan  Needle gauge: 24 G Additional Notes Consent was obtained prior to the procedure with all questions answered and concerns addressed. Risks including, but not limited to, bleeding, infection, nerve damage, paralysis, failed block, inadequate analgesia, allergic reaction, high spinal, itching, and headache were discussed and the patient wished to proceed. Functioning IV was confirmed and monitors were applied. Sterile prep and drape, including hand hygiene, mask, and sterile gloves were used. The patient was positioned and the spine was prepped. The skin was anesthetized with lidocaine. Free flow of clear CSF was obtained prior to injecting local anesthetic into the CSF. The spinal needle aspirated freely following injection. The needle was carefully withdrawn. The patient tolerated the procedure well.   Renold Don, MD

## 2020-07-05 NOTE — Care Plan (Signed)
Ortho Bundle Case Management Note  Patient Details  Name: [name] MRN: [id number] Date of Birth: 1940-10-01  Signed        RNCM call to patient to discuss her upcoming Right total knee arthroplasty with Dr. Ninfa Linden. She is an Ortho bundle patient through [facility] and is agreeable to CM. She has all DME needed (FWW, 3in1/BSC, shower chair, cane). She states she lives alone and would like to go to [facility] after discharge for STR. CM did reach out to Intake SW at [facility] and made aware of client's wishes. Received email back from [facility] stating she has no bed availability at this time, but will keep patient on her list in case something comes available.[facility]PT will be on stand-by if plan changes. Will update TOC team in hospital upon admission of SNF FL2 needed. Will continue to follow for needs.          DME Arranged:   (No DME needed) DME Agency:     HH Arranged:    HH Agency:     Additional Comments: Please contact me with any questions of if this plan should need to change.  Jamse Arn, RN, BSN, [employer]  [phone] 07/05/2020, 10:54 AM

## 2020-07-05 NOTE — Interval H&P Note (Signed)
History and Physical Interval Note: The patient understands that she is here today for a right total knee arthroplasty to treat the pain from her right knee osteoarthritis.  There has been no interval change in her medical status or acute change in her medical status.  See recent H&P.  The risk and benefits of surgery have been discussed in detail and informed consent was obtained.  The right knee has been marked.  07/05/2020 7:09 AM  [name]  has presented today for surgery, with the diagnosis of osteoarthritis right knee.  The various methods of treatment have been discussed with the patient and family. After consideration of risks, benefits and other options for treatment, the patient has consented to  Procedure(s): RIGHT TOTAL KNEE ARTHROPLASTY (Right) as a surgical intervention.  The patient's history has been reviewed, patient examined, no change in status, stable for surgery.  I have reviewed the patient's chart and labs.  Questions were answered to the patient's satisfaction.     Mcarthur Rossetti

## 2020-07-05 NOTE — Evaluation (Signed)
Physical Therapy Evaluation Patient Details Name: [name] MRN: [id number] DOB: 1941-02-07 Today's Date: 07/05/2020   History of Present Illness  Pt is a89 y/o female s/p R TKA. PMH includes chronic dislocated L knee, L TKA, R THA, CAD, HTN, and PVD.   Clinical Impression  Pt admitted secondary to problem above with deficits below. Pt requiring min A for bed mobility and max A to stand X2. Unable to achieve full upright as pt having difficulty powering through LLE given she has a chronic knee dislocation. Pt also with block effects, so further mobility deferred. Pt reports she lives alone and plans to go to SNF at d/c. Will continue to follow acutely.     Follow Up Recommendations Follow surgeon's recommendation for DC plan and follow-up therapies;SNF    Equipment Recommendations  None recommended by PT    Recommendations for Other Services       Precautions / Restrictions Precautions Precautions: Knee Precaution Booklet Issued: No Precaution Comments: Verbally reviewed knee precautions.  Required Braces or Orthoses: Knee Immobilizer - Right Restrictions Weight Bearing Restrictions: Yes RLE Weight Bearing: Weight bearing as tolerated      Mobility  Bed Mobility Overal bed mobility: Needs Assistance Bed Mobility: Supine to Sit;Sit to Supine     Supine to sit: Min assist Sit to supine: Min assist   General bed mobility comments: Min A for RLE management.     Transfers Overall transfer level: Needs assistance Equipment used: Rolling walker (2 wheeled) Transfers: Sit to/from Stand Sit to Stand: Max assist         General transfer comment: Attempted standing X2. Was able to clear hips, however, could not get to full upright.   Ambulation/Gait                Stairs            Wheelchair Mobility    Modified Rankin (Stroke Patients Only)       Balance Overall balance assessment: Needs assistance Sitting-balance support: No upper extremity  supported;Feet supported Sitting balance-Leahy Scale: Good     Standing balance support: Bilateral upper extremity supported;During functional activity Standing balance-Leahy Scale: Zero Standing balance comment: unable to achieve full upright.                              Pertinent Vitals/Pain Pain Assessment: Faces Faces Pain Scale: Hurts even more Pain Location: R knee  Pain Descriptors / Indicators: Operative site guarding;Aching Pain Intervention(s): Limited activity within patient's tolerance;Monitored during session;Repositioned    Home Living Family/patient expects to be discharged to:: Skilled nursing facility Living Arrangements: Alone                    Prior Function Level of Independence: Independent with assistive device(s)         Comments: Was using RW at baseline.      Hand Dominance        Extremity/Trunk Assessment   Upper Extremity Assessment Upper Extremity Assessment: Overall WFL for tasks assessed    Lower Extremity Assessment Lower Extremity Assessment: RLE deficits/detail;LLE deficits/detail RLE Deficits / Details: Deficits consistent with post op pain and weakness.  LLE Deficits / Details: Chronically dislocated R knee.     Cervical / Trunk Assessment Cervical / Trunk Assessment: Normal  Communication   Communication: No difficulties  Cognition Arousal/Alertness: Awake/alert Behavior During Therapy: WFL for tasks assessed/performed Overall Cognitive Status: Within Functional Limits for  tasks assessed                                        General Comments      Exercises     Assessment/Plan    PT Assessment Patient needs continued PT services  PT Problem List Decreased strength;Decreased range of motion;Decreased activity tolerance;Decreased balance;Decreased knowledge of use of DME;Decreased knowledge of precautions       PT Treatment Interventions DME instruction;Gait training;Functional  mobility training;Therapeutic activities;Therapeutic exercise;Balance training;Patient/family education    PT Goals (Current goals can be found in the Care Plan section)  Acute Rehab PT Goals Patient Stated Goal: to go to SNF prior to d/c home PT Goal Formulation: With patient Time For Goal Achievement: 07/19/20 Potential to Achieve Goals: Good    Frequency 7X/week   Barriers to discharge        Co-evaluation               AM-PAC PT "6 Clicks" Mobility  Outcome Measure Help needed turning from your back to your side while in a flat bed without using bedrails?: A Little Help needed moving from lying on your back to sitting on the side of a flat bed without using bedrails?: A Little Help needed moving to and from a bed to a chair (including a wheelchair)?: Total Help needed standing up from a chair using your arms (e.g., wheelchair or bedside chair)?: A Lot Help needed to walk in hospital room?: Total Help needed climbing 3-5 steps with a railing? : Total 6 Click Score: 11    End of Session Equipment Utilized During Treatment: Gait belt Activity Tolerance: Patient tolerated treatment well Patient left: in bed;with call bell/phone within reach;with family/visitor present Nurse Communication: Mobility status PT Visit Diagnosis: Unsteadiness on feet (R26.81);Difficulty in walking, not elsewhere classified (R26.2)    Time: 1202-1232 PT Time Calculation (min) (ACUTE ONLY): 30 min   Charges:   PT Evaluation $PT Eval Low Complexity: 1 Low PT Treatments $Therapeutic Activity: 8-22 mins        Lou Miner, DPT  Acute Rehabilitation Services  Pager: [phone] Office: [phone]   Rudean Hitt 07/05/2020, 1:47 PM

## 2020-07-05 NOTE — Brief Op Note (Signed)
07/05/2020  8:59 AM  PATIENT:  [name]  79 y.o. female  PRE-OPERATIVE DIAGNOSIS:  osteoarthritis right knee  POST-OPERATIVE DIAGNOSIS:  osteoarthritis right knee  PROCEDURE:  Procedure(s): RIGHT TOTAL KNEE ARTHROPLASTY (Right)  SURGEON:  Surgeon(s) and Role:    Mcarthur Rossetti, MD - Primary  PHYSICIAN ASSISTANT: Benita Stabile, PA-C  ANESTHESIA:   regional and spinal  EBL:  25 mL   COUNTS:  YES  TOURNIQUET:   Total Tourniquet Time Documented: Thigh (Right) - 43 minutes Total: Thigh (Right) - 43 minutes   DICTATION: .Other Dictation: Dictation Number [id number]  PLAN OF CARE: Admit for overnight observation  PATIENT DISPOSITION:  PACU - hemodynamically stable.   Delay start of Pharmacological VTE agent (>24hrs) due to surgical blood loss or risk of bleeding: no

## 2020-07-05 NOTE — Anesthesia Procedure Notes (Signed)
Procedure Name: MAC Date/Time: 07/05/2020 7:28 AM Performed by: Maxwell Caul, CRNA Pre-anesthesia Checklist: Patient identified, Emergency Drugs available, Suction available and Patient being monitored Oxygen Delivery Method: Simple face mask

## 2020-07-06 ENCOUNTER — Encounter (HOSPITAL_COMMUNITY): Payer: Self-pay | Admitting: Orthopaedic Surgery

## 2020-07-06 ENCOUNTER — Other Ambulatory Visit (HOSPITAL_COMMUNITY): Payer: Medicare Other

## 2020-07-06 DIAGNOSIS — Z7401 Bed confinement status: Secondary | ICD-10-CM | POA: Diagnosis not present

## 2020-07-06 DIAGNOSIS — Z806 Family history of leukemia: Secondary | ICD-10-CM | POA: Diagnosis not present

## 2020-07-06 DIAGNOSIS — R52 Pain, unspecified: Secondary | ICD-10-CM | POA: Diagnosis not present

## 2020-07-06 DIAGNOSIS — Z471 Aftercare following joint replacement surgery: Secondary | ICD-10-CM | POA: Diagnosis not present

## 2020-07-06 DIAGNOSIS — Z803 Family history of malignant neoplasm of breast: Secondary | ICD-10-CM | POA: Diagnosis not present

## 2020-07-06 DIAGNOSIS — R410 Disorientation, unspecified: Secondary | ICD-10-CM | POA: Diagnosis not present

## 2020-07-06 DIAGNOSIS — I119 Hypertensive heart disease without heart failure: Secondary | ICD-10-CM | POA: Diagnosis present

## 2020-07-06 DIAGNOSIS — Z96652 Presence of left artificial knee joint: Secondary | ICD-10-CM | POA: Diagnosis present

## 2020-07-06 DIAGNOSIS — Z8614 Personal history of Methicillin resistant Staphylococcus aureus infection: Secondary | ICD-10-CM | POA: Diagnosis not present

## 2020-07-06 DIAGNOSIS — Z96651 Presence of right artificial knee joint: Secondary | ICD-10-CM | POA: Diagnosis not present

## 2020-07-06 DIAGNOSIS — F0390 Unspecified dementia without behavioral disturbance: Secondary | ICD-10-CM | POA: Diagnosis present

## 2020-07-06 DIAGNOSIS — R5381 Other malaise: Secondary | ICD-10-CM | POA: Diagnosis not present

## 2020-07-06 DIAGNOSIS — Z90710 Acquired absence of both cervix and uterus: Secondary | ICD-10-CM | POA: Diagnosis not present

## 2020-07-06 DIAGNOSIS — M2550 Pain in unspecified joint: Secondary | ICD-10-CM | POA: Diagnosis not present

## 2020-07-06 DIAGNOSIS — Z955 Presence of coronary angioplasty implant and graft: Secondary | ICD-10-CM | POA: Diagnosis not present

## 2020-07-06 DIAGNOSIS — Z87442 Personal history of urinary calculi: Secondary | ICD-10-CM | POA: Diagnosis not present

## 2020-07-06 DIAGNOSIS — Z20822 Contact with and (suspected) exposure to covid-19: Secondary | ICD-10-CM | POA: Diagnosis present

## 2020-07-06 DIAGNOSIS — I420 Dilated cardiomyopathy: Secondary | ICD-10-CM | POA: Diagnosis present

## 2020-07-06 DIAGNOSIS — Z823 Family history of stroke: Secondary | ICD-10-CM | POA: Diagnosis not present

## 2020-07-06 DIAGNOSIS — M1711 Unilateral primary osteoarthritis, right knee: Secondary | ICD-10-CM | POA: Diagnosis present

## 2020-07-06 DIAGNOSIS — I739 Peripheral vascular disease, unspecified: Secondary | ICD-10-CM | POA: Diagnosis present

## 2020-07-06 DIAGNOSIS — E785 Hyperlipidemia, unspecified: Secondary | ICD-10-CM | POA: Diagnosis present

## 2020-07-06 DIAGNOSIS — I10 Essential (primary) hypertension: Secondary | ICD-10-CM | POA: Diagnosis not present

## 2020-07-06 DIAGNOSIS — Z79899 Other long term (current) drug therapy: Secondary | ICD-10-CM | POA: Diagnosis not present

## 2020-07-06 DIAGNOSIS — J449 Chronic obstructive pulmonary disease, unspecified: Secondary | ICD-10-CM | POA: Diagnosis present

## 2020-07-06 DIAGNOSIS — I2510 Atherosclerotic heart disease of native coronary artery without angina pectoris: Secondary | ICD-10-CM | POA: Diagnosis present

## 2020-07-06 DIAGNOSIS — Z96643 Presence of artificial hip joint, bilateral: Secondary | ICD-10-CM | POA: Diagnosis present

## 2020-07-06 DIAGNOSIS — I350 Nonrheumatic aortic (valve) stenosis: Secondary | ICD-10-CM | POA: Diagnosis not present

## 2020-07-06 DIAGNOSIS — M810 Age-related osteoporosis without current pathological fracture: Secondary | ICD-10-CM | POA: Diagnosis present

## 2020-07-06 DIAGNOSIS — Z87891 Personal history of nicotine dependence: Secondary | ICD-10-CM | POA: Diagnosis not present

## 2020-07-06 DIAGNOSIS — Z7982 Long term (current) use of aspirin: Secondary | ICD-10-CM | POA: Diagnosis not present

## 2020-07-06 LAB — BASIC METABOLIC PANEL
Anion gap: 7 (ref 5–15)
BUN: 12 mg/dL (ref 8–23)
CO2: 27 mmol/L (ref 22–32)
Calcium: 9 mg/dL (ref 8.9–10.3)
Chloride: 101 mmol/L (ref 98–111)
Creatinine, Ser: 0.77 mg/dL (ref 0.44–1.00)
GFR, Estimated: >60 mL/min (ref >60)
Glucose, Bld: 144 mg/dL — ABNORMAL HIGH (ref 70–99)
Potassium: 4.1 mmol/L (ref 3.5–5.1)
Sodium: 135 mmol/L (ref 135–145)

## 2020-07-06 LAB — CBC
HCT: 31.8 % — ABNORMAL LOW (ref 36.0–46.0)
Hemoglobin: 10.9 g/dL — ABNORMAL LOW (ref 12.0–15.0)
MCH: 32.3 pg (ref 26.0–34.0)
MCHC: 34.3 g/dL (ref 30.0–36.0)
MCV: 94.4 fL (ref 80.0–100.0)
Platelets: 214 10*3/uL (ref 150–400)
RBC: 3.37 MIL/uL — ABNORMAL LOW (ref 3.87–5.11)
RDW: 12.6 % (ref 11.5–15.5)
WBC: 7.6 10*3/uL (ref 4.0–10.5)
nRBC: 0 % (ref 0.0–0.2)

## 2020-07-06 LAB — SARS CORONAVIRUS 2 BY RT PCR (HOSPITAL ORDER, PERFORMED IN ~~LOC~~ HOSPITAL LAB): SARS Coronavirus 2: NEGATIVE

## 2020-07-06 NOTE — NC FL2 (Signed)
[place] LEVEL OF CARE SCREENING TOOL     IDENTIFICATION  Patient Name: [name] Birthdate: 11-10-1940 Sex: female Admission Date (Current Location): 07/05/2020  [facility] and [place] Number:  Herbalist and Address:  [facility], [place] [address], [place], [place] [address]      Provider Number: [id number]  Attending Physician Name and Address:  Mcarthur Rossetti  Relative Name and Phone Number:       Current Level of Care: SNF Recommended Level of Care: [place] Prior Approval Number:    Date Approved/Denied:   PASRR Number: [id number]  Discharge Plan: SNF    Current Diagnoses: Patient Active Problem List   Diagnosis Date Noted  . Status post total right knee replacement 07/05/2020  . Syncope 04/01/2019  . Closed fracture of multiple pubic rami ([place]) 07/22/2018  . Closed fracture of right superior rim of pubis ([place])   . Lateral dislocation of left patella 01/15/2017  . Unilateral primary osteoarthritis, right knee 01/15/2017  . Osteoporosis 11/26/2016  . Acute pain of left knee 08/27/2016  . Chronic diarrhea 08/07/2016  . Left displaced femoral neck fracture ([place]) 06/26/2016  . MRSA (methicillin resistant Staphylococcus aureus) infection 06/26/2016  . Hypertensive heart disease   . Hydronephrosis   . Obstructive uropathy   . Abnormal echocardiogram 01/23/2016  . Pressure ulcer 01/21/2016  . Chronic infection of prosthetic knee ([place]) 10/12/2015  . AAA 01/19/2009  . ILIAC ARTERY ANEURYSM 01/19/2009  . Hyperlipidemia 01/14/2009  . TOBACCO ABUSE 01/14/2009  . Essential hypertension 01/14/2009  . Coronary atherosclerosis 01/14/2009  . Cerebrovascular disease 01/14/2009  . Peripheral vascular disease ([place]) 01/14/2009  . CHRONIC OBSTRUCTIVE PULMONARY DISEASE 01/14/2009    Orientation RESPIRATION BLADDER Height & Weight     Self, Time, Situation, Place  Normal External catheter  Weight: 51.5 kg Height:  5\' 4"  (162.6 cm)  BEHAVIORAL SYMPTOMS/MOOD NEUROLOGICAL BOWEL NUTRITION STATUS      Continent Diet (refer to d/c summary)  AMBULATORY STATUS COMMUNICATION OF NEEDS Skin   Extensive Assist Verbally Surgical wounds (s/p R TKA on 07/05/20)                       Personal Care Assistance Level of Assistance  Bathing, Feeding, Dressing Bathing Assistance: Maximum assistance Feeding assistance: Independent Dressing Assistance: Maximum assistance     Functional Limitations Info  Sight, Hearing, Speech Sight Info: Adequate Hearing Info: Adequate Speech Info: Adequate    SPECIAL CARE FACTORS FREQUENCY  PT (By licensed PT), OT (By licensed OT)     PT Frequency: 5x/week, evaluate and treat OT Frequency: 5x/week, evaluate and treat            Contractures Contractures Info: Not present    Additional Factors Info  Allergies, Code Status Code Status Info: full code Allergies Info: No known allergies           Current Medications (07/06/2020):  This is the current hospital active medication list Current Facility-Administered Medications  Medication Dose Route Frequency Provider Last Rate Last Admin  . 0.9 %  sodium chloride infusion   Intravenous Continuous Mcarthur Rossetti, MD 75 mL/hr at 07/06/20 0209 New Bag at 07/06/20 0209  . acetaminophen (TYLENOL) tablet 325-650 mg  325-650 mg Oral Q6H PRN Mcarthur Rossetti, MD   650 mg at 07/05/20 2355  . alum & mag hydroxide-simeth (MAALOX/MYLANTA) 200-200-20 MG/5ML suspension 30 mL  30 mL Oral Q4H PRN Jean Rosenthal  Y, MD      . amLODipine (NORVASC) tablet 10 mg  10 mg Oral Daily Mcarthur Rossetti, MD   10 mg at 07/06/20 1033  . aspirin EC tablet 325 mg  325 mg Oral BID PC Mcarthur Rossetti, MD   325 mg at 07/06/20 1652  . atorvastatin (LIPITOR) tablet 40 mg  40 mg Oral Daily Mcarthur Rossetti, MD   40 mg at 07/06/20 1700  . carvedilol (COREG) tablet 25 mg  25 mg Oral BID  WC Mcarthur Rossetti, MD   25 mg at 07/06/20 1631  . diphenhydrAMINE (BENADRYL) 12.5 MG/5ML elixir 12.5-25 mg  12.5-25 mg Oral Q4H PRN Mcarthur Rossetti, MD      . docusate sodium (COLACE) capsule 100 mg  100 mg Oral BID Mcarthur Rossetti, MD   100 mg at 07/06/20 1033  . doxycycline (VIBRA-TABS) tablet 100 mg  100 mg Oral BID Mcarthur Rossetti, MD   100 mg at 07/06/20 1033  . furosemide (LASIX) tablet 20 mg  20 mg Oral Angelica Chessman, MD   20 mg at 07/06/20 1033  . hydrALAZINE (APRESOLINE) tablet 25 mg  25 mg Oral Q8H Mcarthur Rossetti, MD   25 mg at 07/06/20 1600  . HYDROmorphone (DILAUDID) injection 0.5-1 mg  0.5-1 mg Intravenous Q4H PRN Mcarthur Rossetti, MD   1 mg at 07/05/20 1609  . magnesium oxide (MAG-OX) tablet 200 mg  200 mg Oral Zackery Barefoot, MD   200 mg at 07/06/20 0617  . menthol-cetylpyridinium (CEPACOL) lozenge 3 mg  1 lozenge Oral PRN Mcarthur Rossetti, MD       Or  . phenol (CHLORASEPTIC) mouth spray 1 spray  1 spray Mouth/Throat PRN Mcarthur Rossetti, MD      . methocarbamol (ROBAXIN) tablet 500 mg  500 mg Oral Q6H PRN Mcarthur Rossetti, MD   500 mg at 07/06/20 1157   Or  . methocarbamol (ROBAXIN) 500 mg in dextrose 5 % 50 mL IVPB  500 mg Intravenous Q6H PRN Mcarthur Rossetti, MD      . metoCLOPramide (REGLAN) tablet 5-10 mg  5-10 mg Oral Q8H PRN Mcarthur Rossetti, MD       Or  . metoCLOPramide (REGLAN) injection 5-10 mg  5-10 mg Intravenous Q8H PRN Mcarthur Rossetti, MD      . multivitamin with minerals tablet 1 tablet  1 tablet Oral Daily Mcarthur Rossetti, MD   1 tablet at 07/06/20 1033  . ondansetron (ZOFRAN) tablet 4 mg  4 mg Oral Q6H PRN Mcarthur Rossetti, MD       Or  . ondansetron [facility]) injection 4 mg  4 mg Intravenous Q6H PRN Mcarthur Rossetti, MD      . oxyCODONE (Oxy IR/ROXICODONE) immediate release tablet 10-15 mg  10-15 mg Oral Q4H PRN  Mcarthur Rossetti, MD   10 mg at 07/05/20 1443  . oxyCODONE (Oxy IR/ROXICODONE) immediate release tablet 5-10 mg  5-10 mg Oral Q4H PRN Mcarthur Rossetti, MD   5 mg at 07/06/20 0617  . pantoprazole (PROTONIX) EC tablet 40 mg  40 mg Oral Daily Mcarthur Rossetti, MD   40 mg at 07/06/20 1033  . polyethylene glycol (MIRALAX / GLYCOLAX) packet 17 g  17 g Oral Daily PRN Mcarthur Rossetti, MD      . potassium chloride SA (KLOR-CON) CR tablet 20 mEq  20 mEq Oral Daily Mcarthur Rossetti, MD  20 mEq at 07/06/20 1033  . rifampin (RIFADIN) capsule 300 mg  300 mg Oral BID Mcarthur Rossetti, MD   300 mg at 07/06/20 1033     Discharge Medications: Please see discharge summary for a list of discharge medications.  Relevant Imaging Results:  Relevant Lab Results:   Additional Information [phone] [place], [place]

## 2020-07-06 NOTE — Progress Notes (Signed)
Subjective: 1 Day Post-Op Procedure(s) (LRB): RIGHT TOTAL KNEE ARTHROPLASTY (Right) Patient reports pain as moderate to severe.  Slightly confused to where she is this AM.   Objective: Vital signs in last 24 hours: Temp:  [96.7 F (35.9 C)-98 F (36.7 C)] 98 F (36.7 C) (12/08 0400) Pulse Rate:  [56-76] 75 (12/08 0400) Resp:  [14-23] 18 (12/08 0400) BP: (109-168)/(33-96) 162/73 (12/08 0400) SpO2:  [80 %-98 %] 98 % (12/08 0400)  Intake/Output from previous day: 12/07 0701 - 12/08 0700 In: 2044 [P.O.:240; I.V.:1604; IV Piggyback:200] Out: 2275 [Urine:2250; Blood:25] Intake/Output this shift: No intake/output data recorded.  Recent Labs    07/06/20 0304  HGB 10.9*   Recent Labs    07/06/20 0304  WBC 7.6  RBC 3.37*  HCT 31.8*  PLT 214   Recent Labs    07/06/20 0304  NA 135  K 4.1  CL 101  CO2 27  BUN 12  CREATININE 0.77  GLUCOSE 144*  CALCIUM 9.0   No results for input(s): LABPT, INR in the last 72 hours.  Sensation intact distally Dorsiflexion/Plantar flexion intact Incision: dressing C/D/I Compartment soft Follows directions, Confused to place.   Assessment/Plan: 1 Day Post-Op Procedure(s) (LRB): RIGHT TOTAL KNEE ARTHROPLASTY (Right) Up with therapy  Dispo SNF when appropriate.       Brenner Visconti 07/06/2020, 8:20 AM

## 2020-07-06 NOTE — Plan of Care (Signed)
  Problem: Education: Goal: Knowledge of General Education information will improve Description: Including pain rating scale, medication(s)/side effects and non-pharmacologic comfort measures Outcome: Progressing   Problem: Clinical Measurements: Goal: Will remain free from infection Outcome: Progressing   Problem: Activity: Goal: Risk for activity intolerance will decrease Outcome: Progressing   Problem: Coping: Goal: Level of anxiety will decrease Outcome: Progressing   Problem: Pain Managment: Goal: General experience of comfort will improve Outcome: Progressing   

## 2020-07-06 NOTE — Progress Notes (Signed)
Physical Therapy Treatment Patient Details Name: [name] MRN: [id number] DOB: [birth year] Today's Date: 07/06/2020    History of Present Illness Pt is a 79 y/o female s/p R TKA on 07/05/20. PMH includes chronic dislocated L knee, L TKA, R THA, CAD, HTN, PVD.   PT Comments    Pt progressing with mobility and motivated to participate. Performed transfer training with stedy standing frame due to pt's knee instability; pt able to perform multiple bouts with modA+2 for trunk elevation and eccentric control. Pt pleasantly confused this session, apparent cognitive impairment worsened by fatigue by end of session. Continue to recommend SNF-level therapies to maximize functional mobility and independence prior to return home.    Follow Up Recommendations  SNF;Supervision for mobility/OOB;Follow surgeon's recommendation for DC plan and follow-up therapies     Equipment Recommendations  None recommended by PT    Recommendations for Other Services       Precautions / Restrictions Precautions Precautions: Knee;Fall;Other (comment) Precaution Comments: Urinary incontinence/frequency; L knee with chronic dislocation Restrictions Weight Bearing Restrictions: Yes RLE Weight Bearing: Weight bearing as tolerated    Mobility  Bed Mobility Overal bed mobility: Needs Assistance Bed Mobility: Supine to Sit     Supine to sit: Mod assist     General bed mobility comments: ModA for RLE management and trunk elevation; HOB elevated and use of bed rail  Transfers Overall transfer level: Needs assistance Equipment used: Ambulation equipment used Transfers: Sit to/from Stand Sit to Stand: Mod assist;+2 physical assistance;+2 safety/equipment         General transfer comment: Performed multiple sit<>stands to stedy frame from EOB and lower recliner height, pt requiring consistent modA+2 for trunk elevation; pt able to assist well with BUE support, limited by bilateral knee pain and  buckling  Ambulation/Gait                 Stairs             Wheelchair Mobility    Modified Rankin (Stroke Patients Only)       Balance Overall balance assessment: Needs assistance Sitting-balance support: No upper extremity supported;Feet supported Sitting balance-Leahy Scale: Fair     Standing balance support: Bilateral upper extremity supported;During functional activity Standing balance-Leahy Scale: Poor Standing balance comment: Reliant on BUE support and external assist standing fully upright in stedy frame                            Cognition Arousal/Alertness: Awake/alert Behavior During Therapy: WFL for tasks assessed/performed Overall Cognitive Status: Impaired/Different from baseline Area of Impairment: Orientation;Attention;Memory;Following commands;Awareness;Problem solving                 Orientation Level: Disoriented to;Place;Time;Situation Current Attention Level: Sustained;Selective Memory: Decreased short-term memory Following Commands: Follows one step commands consistently   Awareness: Intellectual;Emergent Problem Solving: Requires verbal cues General Comments: Appropriately conversant at beginning of session although demonstrating attention deficits; appears more disoriented by end of session (suspect due to fatigue and medications?; RN aware)      Exercises      General Comments General comments (skin integrity, edema, etc.): dependent for pericare while standing due to multiple bouts of urine incontinence (pt reports she typically wears Depends due to urine incontinence but did not bring enough with her - RN/NT aware)      Pertinent Vitals/Pain Pain Assessment: Faces Faces Pain Scale: Hurts even more Pain Location: R knee > L knee Pain Descriptors /  Indicators: Operative site guarding;Discomfort;Grimacing Pain Intervention(s): Monitored during session;Limited activity within patient's  tolerance;Repositioned;Ice applied    Home Living                      Prior Function            PT Goals (current goals can now be found in the care plan section) Progress towards PT goals: Progressing toward goals    Frequency    7X/week      PT Plan Current plan remains appropriate    Co-evaluation              AM-PAC PT "6 Clicks" Mobility   Outcome Measure  Help needed turning from your back to your side while in a flat bed without using bedrails?: A Lot Help needed moving from lying on your back to sitting on the side of a flat bed without using bedrails?: A Lot Help needed moving to and from a bed to a chair (including a wheelchair)?: Total Help needed standing up from a chair using your arms (e.g., wheelchair or bedside chair)?: A Lot Help needed to walk in hospital room?: Total Help needed climbing 3-5 steps with a railing? : Total 6 Click Score: 9    End of Session Equipment Utilized During Treatment: Gait belt Activity Tolerance: Patient tolerated treatment well Patient left: in chair;with call bell/phone within reach;with chair alarm set Nurse Communication: Mobility status;Other (comment) (frequent urine incontinence, likely need for purewick) PT Visit Diagnosis: Unsteadiness on feet (R26.81);Difficulty in walking, not elsewhere classified (R26.2)     Time: 1497-0263 PT Time Calculation (min) (ACUTE ONLY): 29 min  Charges:  $Therapeutic Activity: 23-37 mins                    [name], PT, DPT Acute Rehabilitation Services  Pager [phone] Office [phone]  Derry Lory 07/06/2020, 1:08 PM

## 2020-07-06 NOTE — TOC Initial Note (Signed)
Transition of Care [facility]) - Initial/Assessment Note    Patient Details  Name: [name] MRN: [id number] Date of Birth: 15-Apr-1941  [facility] CM/SW Contact:    [name], RN Phone Number: 07/06/2020, 5:05 PM  Clinical Narrative:       Pt  s/p R TKA on 07/05/20. PMH includes chronic dislocated L knee, L TKA, R THA, CAD, HTN, PVD.         RNCM received consult for possible SNF placement at time of discharge. RNCM spoke with patient regarding PT recommendation of SNF placement at time of discharge. Patient states she lives alone and  is currently unable to care for self independently  given patient's current physical needs and fall risk. Patient expressed understanding of PT recommendation and is agreeable to SNF placement at time of discharge. Patient reports preference for [facility]  . RNCM discussed insurance authorization process and provided Medicare SNF ratings list. Patient expressed being hopeful for rehab and to feel better soon. No further questions reported at this time. RNCM to continue to follow and assist with discharge planning needs. Pt COVID vaccinated.  Expected Discharge Plan: Skilled Nursing Facility Barriers to Discharge: Continued Medical Work up   Patient Goals and CMS Choice Patient states their goals for this hospitalization and ongoing recovery are:: to get better and go home CMS Medicare.gov Compare Post Acute Care list provided to:: Patient    Expected Discharge Plan and Services Expected Discharge Plan: [place]   Discharge Planning Services: CM Consult   Living arrangements for the past 2 months: Single Family Home                 DME Arranged:  (No DME needed)                    Prior Living Arrangements/Services Living arrangements for the past 2 months: Single Family Home Lives with:: Self Patient language and need for interpreter reviewed:: Yes Do you feel safe going back to the place where you  live?: Yes      Need for Family Participation in Patient Care: Yes (Comment) Care giver support system in place?: No (comment)   Criminal Activity/Legal Involvement Pertinent to Current Situation/Hospitalization: No - Comment as needed  Activities of Daily Living Home Assistive Devices/Equipment: Eyeglasses, Environmental consultant (specify type), Dentures (specify type) ADL Screening (condition at time of admission) Patient's cognitive ability adequate to safely complete daily activities?: Yes Is the patient deaf or have difficulty hearing?: No Does the patient have difficulty seeing, even when wearing glasses/contacts?: No Does the patient have difficulty concentrating, remembering, or making decisions?: No Patient able to express need for assistance with ADLs?: Yes Does the patient have difficulty dressing or bathing?: No Independently performs ADLs?: Yes (appropriate for developmental age) Does the patient have difficulty walking or climbing stairs?: Yes Weakness of Legs: Right Weakness of Arms/Hands: None  Permission Sought/Granted                  Emotional Assessment Appearance:: Appears stated age Attitude/Demeanor/Rapport: Gracious Affect (typically observed): Accepting Orientation: : Oriented to Self, Oriented to Place, Oriented to  Time, Oriented to Situation Alcohol / Substance Use: Not Applicable Psych Involvement: No (comment)  Admission diagnosis:  Status post total right knee replacement [Z96.651] Patient Active Problem List   Diagnosis Date Noted  . Status post total right knee replacement 07/05/2020  . Syncope 04/01/2019  . Closed fracture of multiple pubic rami ([place]) 07/22/2018  .  Closed fracture of right superior rim of pubis ([place])   . Lateral dislocation of left patella 01/15/2017  . Unilateral primary osteoarthritis, right knee 01/15/2017  . Osteoporosis 11/26/2016  . Acute pain of left knee 08/27/2016  . Chronic diarrhea 08/07/2016  . Left displaced femoral neck  fracture ([place]) 06/26/2016  . MRSA (methicillin resistant Staphylococcus aureus) infection 06/26/2016  . Hypertensive heart disease   . Hydronephrosis   . Obstructive uropathy   . Abnormal echocardiogram 01/23/2016  . Pressure ulcer 01/21/2016  . Chronic infection of prosthetic knee ([place]) 10/12/2015  . AAA 01/19/2009  . ILIAC ARTERY ANEURYSM 01/19/2009  . Hyperlipidemia 01/14/2009  . TOBACCO ABUSE 01/14/2009  . Essential hypertension 01/14/2009  . Coronary atherosclerosis 01/14/2009  . Cerebrovascular disease 01/14/2009  . Peripheral vascular disease ([place]) 01/14/2009  . CHRONIC OBSTRUCTIVE PULMONARY DISEASE 01/14/2009   PCP:  Darreld Mclean, MD Pharmacy:   [facility] [place], [place] [address] Phone: [phone] Fax: [phone]     Social Determinants of Health (SDOH) Interventions    Readmission Risk Interventions No flowsheet data found.

## 2020-07-07 DIAGNOSIS — M810 Age-related osteoporosis without current pathological fracture: Secondary | ICD-10-CM | POA: Diagnosis not present

## 2020-07-07 DIAGNOSIS — Z8614 Personal history of Methicillin resistant Staphylococcus aureus infection: Secondary | ICD-10-CM | POA: Diagnosis not present

## 2020-07-07 DIAGNOSIS — Z789 Other specified health status: Secondary | ICD-10-CM | POA: Diagnosis not present

## 2020-07-07 DIAGNOSIS — E785 Hyperlipidemia, unspecified: Secondary | ICD-10-CM | POA: Diagnosis not present

## 2020-07-07 DIAGNOSIS — R410 Disorientation, unspecified: Secondary | ICD-10-CM | POA: Diagnosis not present

## 2020-07-07 DIAGNOSIS — I350 Nonrheumatic aortic (valve) stenosis: Secondary | ICD-10-CM | POA: Diagnosis not present

## 2020-07-07 DIAGNOSIS — I420 Dilated cardiomyopathy: Secondary | ICD-10-CM | POA: Diagnosis not present

## 2020-07-07 DIAGNOSIS — I10 Essential (primary) hypertension: Secondary | ICD-10-CM | POA: Diagnosis not present

## 2020-07-07 DIAGNOSIS — R52 Pain, unspecified: Secondary | ICD-10-CM | POA: Diagnosis not present

## 2020-07-07 DIAGNOSIS — I2510 Atherosclerotic heart disease of native coronary artery without angina pectoris: Secondary | ICD-10-CM | POA: Diagnosis not present

## 2020-07-07 DIAGNOSIS — M1711 Unilateral primary osteoarthritis, right knee: Secondary | ICD-10-CM | POA: Diagnosis not present

## 2020-07-07 DIAGNOSIS — Z471 Aftercare following joint replacement surgery: Secondary | ICD-10-CM | POA: Diagnosis not present

## 2020-07-07 DIAGNOSIS — Z96651 Presence of right artificial knee joint: Secondary | ICD-10-CM | POA: Diagnosis not present

## 2020-07-07 DIAGNOSIS — Z7409 Other reduced mobility: Secondary | ICD-10-CM | POA: Diagnosis not present

## 2020-07-07 DIAGNOSIS — M2550 Pain in unspecified joint: Secondary | ICD-10-CM | POA: Diagnosis not present

## 2020-07-07 DIAGNOSIS — R5381 Other malaise: Secondary | ICD-10-CM | POA: Diagnosis not present

## 2020-07-07 DIAGNOSIS — I739 Peripheral vascular disease, unspecified: Secondary | ICD-10-CM | POA: Diagnosis not present

## 2020-07-07 DIAGNOSIS — Z7401 Bed confinement status: Secondary | ICD-10-CM | POA: Diagnosis not present

## 2020-07-07 LAB — GLUCOSE, CAPILLARY: Glucose-Capillary: 147 mg/dL — ABNORMAL HIGH (ref 70–99)

## 2020-07-07 MED ORDER — OXYCODONE HCL 5 MG PO TABS: 5.0000 mg | ORAL_TABLET | ORAL | 0 refills | Status: DC | PRN

## 2020-07-07 MED ORDER — LISINOPRIL 20 MG PO TABS
40.0000 mg | ORAL_TABLET | Freq: Every day | ORAL | Status: DC
  Administered 2020-07-07: 40 mg via ORAL
  Filled 2020-07-07: qty 2

## 2020-07-07 MED ORDER — METHOCARBAMOL 500 MG PO TABS
500.0000 mg | ORAL_TABLET | Freq: Four times a day (QID) | ORAL | 0 refills | Status: DC | PRN
Start: 2020-07-07 — End: 2021-10-02

## 2020-07-07 MED ORDER — ASPIRIN EC 81 MG PO TBEC: 81.0000 mg | DELAYED_RELEASE_TABLET | Freq: Two times a day (BID) | ORAL | 0 refills | Status: AC

## 2020-07-07 NOTE — Progress Notes (Signed)
AVS discharge summary was given to pt and went over with her. Report was called to facility she is going to. IV was removed with catheter intact. [facility] was called for transport. Pt had daughter in law at bedside while I went over discharge instructions.

## 2020-07-07 NOTE — Discharge Summary (Signed)
Patient ID: [name] MRN: [id number] DOB/AGE: 06-May-1941 79 y.o.  Admit date: 07/05/2020 Discharge date: 07/07/2020  Admission Diagnoses:  Principal Problem:   Unilateral primary osteoarthritis, right knee Active Problems:   Status post total right knee replacement   Discharge Diagnoses:  Same  Past Medical History:  Diagnosis Date  . Arthritis   . CAD (coronary artery disease)    a. 02/2007 s/p PCI/DES ot LCX and RCA;  b. 07/2014 MV: no ischemia/infarct, EF 68%.  . Carotid arterial disease ([place])    a. 05/2015 Carotid U/S: RICA 2-50%, LICA 53-97%, RECA 6-73%, LECA >50% - overall stable, f/u 1 yr.  . Complication of anesthesia    Three days after procedure pt statrted to suffer with memoy loss that took about 4-5 days to come back according to [name]."  . Dilated cardiomyopathy ([place])    a. 05/2005 Echo: EF 65%; b. 01/22/2016 Echo: EF 25-30% (in setting of admission for urosepsis w/ elev trop); c. 01/26/2016 Echo: EF 35-40%, inf, distal apical, apical, and mid to distal septal HK-->felt to be 2/2 stress induced CM.  [name] Heart murmur    mild AS 04/2020  . History of bronchitis   . Hyperlipidemia   . Hypertension   . Hypertensive heart disease   . Memory impairment   . MRSA (methicillin resistant staph aureus) culture positive   . Nephrolithiasis    a. 12/2015 UVJ stone w/ hydroureteronephrosis req nephrostomy tube placement.  . Osteoarthritis    a. 10/1935 s/p L TKA complicated by infections req skin grafting.  [name] PVD (peripheral vascular disease) ([place])    a. Bilat common iliac dzs, nl ABI's --> med rx.  . Sepsis ([place])    a. 12/2015 admitted with Proteus mirabilis urosepsis/bacteremia.  . Wears glasses     Surgeries: Procedure(s): RIGHT TOTAL KNEE ARTHROPLASTY on 07/05/2020   Consultants:   Discharged Condition: Improved  Hospital Course: [name] is an 79 y.o. female who was admitted 07/05/2020 for operative treatment ofUnilateral primary osteoarthritis, right  knee. Patient has severe unremitting pain that affects sleep, daily activities, and work/hobbies. After pre-op clearance the patient was taken to the operating room on 07/05/2020 and underwent  Procedure(s): RIGHT TOTAL KNEE ARTHROPLASTY.    Patient was given perioperative antibiotics:  Anti-infectives (From admission, onward)   Start     Dose/Rate Route Frequency Ordered Stop   07/05/20 1330  ceFAZolin (ANCEF) IVPB 1 g/50 mL premix        1 g 100 mL/hr over 30 Minutes Intravenous Every 6 hours 07/05/20 0938 07/05/20 2046   07/05/20 1230  rifampin (RIFADIN) capsule 300 mg        300 mg Oral 2 times daily 07/05/20 1055     07/05/20 1200  doxycycline (VIBRA-TABS) tablet 100 mg        100 mg Oral 2 times daily 07/05/20 1055     07/05/20 0615  ceFAZolin (ANCEF) IVPB 2g/100 mL premix        2 g 200 mL/hr over 30 Minutes Intravenous On call to O.R. 07/05/20 0600 07/05/20 0752       Patient was given sequential compression devices, early ambulation, and chemoprophylaxis to prevent DVT.  Patient benefited maximally from hospital stay and there were no complications.    Recent vital signs:  Patient Vitals for the past 24 hrs:  BP Temp Temp src Pulse Resp SpO2  07/07/20 0731 (!) 168/77 98.1 F (36.7 C) Oral 89 17 96 %  07/07/20 0450 [name])  169/70 97.9 F (36.6 C) Oral 89 16 98 %  07/06/20 2045 (!) 174/70 98 F (36.7 C) Oral 92 16 97 %  07/06/20 1458 (!) 157/63 97.6 F (36.4 C) Oral 70 14 96 %  07/06/20 0828 (!) 191/76 98.7 F (37.1 C) Oral 83 17 95 %     Recent laboratory studies:  Recent Labs    07/06/20 0304  WBC 7.6  HGB 10.9*  HCT 31.8*  PLT 214  NA 135  K 4.1  CL 101  CO2 27  BUN 12  CREATININE 0.77  GLUCOSE 144*  CALCIUM 9.0     Discharge Medications:   Allergies as of 07/07/2020   No Known Allergies     Medication List    STOP taking these medications   traMADol 50 MG tablet Commonly known as: ULTRAM     TAKE these medications   amLODipine 10 MG  tablet Commonly known as: NORVASC Take 1 tablet (10 mg total) by mouth daily.   aspirin EC 81 MG tablet Take 1 tablet (81 mg total) by mouth 2 (two) times daily at 8 am and 10 pm. What changed: when to take this   atorvastatin 40 MG tablet Commonly known as: LIPITOR Take 1 tablet (40 mg total) by mouth daily.   CALCIUM PO Take 1 tablet by mouth daily.   carvedilol 25 MG tablet Commonly known as: COREG Take 1 tablet (25 mg total) by mouth 2 (two) times daily with a meal.   doxycycline 100 MG tablet Commonly known as: VIBRA-TABS Take 1 tablet (100 mg total) by mouth 2 (two) times daily.   furosemide 20 MG tablet Commonly known as: LASIX Take 1 tablet (20 mg total) by mouth every other day.   hydrALAZINE 25 MG tablet Commonly known as: APRESOLINE Take 1 tablet (25 mg total) by mouth every 8 (eight) hours.   lisinopril 40 MG tablet Commonly known as: ZESTRIL Take 1 tablet (40 mg total) by mouth daily.   Mag-Oxide 200 MG Tabs Generic drug: Magnesium Oxide Take 200 mg by mouth every morning.   methocarbamol 500 MG tablet Commonly known as: ROBAXIN Take 1 tablet (500 mg total) by mouth every 6 (six) hours as needed for muscle spasms.   multivitamin with minerals Tabs tablet Take 1 tablet by mouth daily.   naproxen sodium 220 MG tablet Commonly known as: ALEVE Take 440 mg by mouth daily as needed (pain).   oxyCODONE 5 MG immediate release tablet Commonly known as: Oxy IR/ROXICODONE Take 1 tablet (5 mg total) by mouth every 4 (four) hours as needed for moderate pain (pain score 4-6).   potassium chloride SA 20 MEQ tablet Commonly known as: Klor-Con M20 Take 1 tablet (20 mEq total) by mouth daily.   PROBIOTIC DAILY PO Take 1 capsule by mouth daily.   rifampin 300 MG capsule Commonly known as: RIFADIN Take 1 capsule (300 mg total) by mouth 2 (two) times daily.            Durable Medical Equipment  (From admission, onward)         Start     Ordered    07/05/20 1056  DME 3 n 1  Once        07/05/20 1055          Diagnostic Studies: DG Knee Right Port  Result Date: 07/05/2020 CLINICAL DATA:  Total knee arthroplasty. EXAM: PORTABLE RIGHT KNEE - 1-2 VIEW COMPARISON:  02/04/2020 FINDINGS: Well seated components of a total right  knee arthroplasty. No complicating features are identified. Expected air in the joint. Advanced vascular calcifications. IMPRESSION: Well seated components of a total right knee arthroplasty. Electronically Signed   By: Marijo Sanes M.D.   On: 07/05/2020 10:36    Disposition: Discharge disposition: 03-Skilled [facility]    Mcarthur Rossetti, MD. Go on 07/18/2020.   Specialty: Orthopedic Surgery Why: at 2:00 pm for your 2 week follow up with your surgeon Contact information: [place] [address] [phone]                Signed: Mcarthur Rossetti 07/07/2020, 7:40 AM

## 2020-07-07 NOTE — TOC Progression Note (Signed)
Transition of Care [facility]) - Progression Note    Patient Details  Name: [name] MRN: [id number] Date of Birth: 1941/05/13  Transition of Care [facility]) CM/SW Contact  [name], RN Phone Number: 07/07/2020, 8:35 AM  Clinical Narrative:    Pt with one noted bed offer, [facility]. NCM shared with pt and pt accepted. [facility] extended bed offer. Insurance auth. Initiated by SNF ...approval pending.  TOC team will continue to monitor and follow ....   Expected Discharge Plan: Skilled Nursing Facility Barriers to Discharge: Insurance Authorization  Expected Discharge Plan and Services Expected Discharge Plan: [place]   Discharge Planning Services: CM Consult   Living arrangements for the past 2 months: Single Family Home Expected Discharge Date: 07/07/20               DME Arranged:  (No DME needed)                     Social Determinants of Health (SDOH) Interventions    Readmission Risk Interventions No flowsheet data found.

## 2020-07-07 NOTE — Progress Notes (Signed)
Patient ID: [name], female   DOB: [birth year], 79 y.o.   MRN: [id number] The patient is awake this morning and eating breakfast.  She answers most of my questions appropriately.  She does follow commands appropriately.  She has been dealing with some increase in her dementia which is common for this patient after surgery.  The plan is to have her transition to skilled nursing facility short-term.  Her vital signs are stable.  Her blood pressure was a little elevated but I just resumed one of her home medications (lisinopril) for blood pressure.  Her right knee is stable and she is medically stable.  She can be discharged to skilled nursing this afternoon if a bed is available.  I will put in discharge orders and medications for that possibility.

## 2020-07-07 NOTE — Plan of Care (Signed)
  Problem: Acute Rehab PT Goals(only PT should resolve) Goal: Pt Will Go Supine/Side To Sit Outcome: Adequate for Discharge Goal: Pt Will Go Sit To Supine/Side Outcome: Adequate for Discharge Goal: Patient Will Transfer Sit To/From Stand Outcome: Adequate for Discharge Goal: Pt Will Ambulate Outcome: Adequate for Discharge   Problem: Education: Goal: Knowledge of General Education information will improve Description: Including pain rating scale, medication(s)/side effects and non-pharmacologic comfort measures Outcome: Adequate for Discharge   Problem: Health Behavior/Discharge Planning: Goal: Ability to manage health-related needs will improve Outcome: Adequate for Discharge   Problem: Clinical Measurements: Goal: Ability to maintain clinical measurements within normal limits will improve Outcome: Adequate for Discharge Goal: Will remain free from infection Outcome: Adequate for Discharge Goal: Diagnostic test results will improve Outcome: Adequate for Discharge Goal: Respiratory complications will improve Outcome: Adequate for Discharge Goal: Cardiovascular complication will be avoided Outcome: Adequate for Discharge   Problem: Activity: Goal: Risk for activity intolerance will decrease Outcome: Adequate for Discharge   Problem: Nutrition: Goal: Adequate nutrition will be maintained Outcome: Adequate for Discharge   Problem: Coping: Goal: Level of anxiety will decrease Outcome: Adequate for Discharge   Problem: Elimination: Goal: Will not experience complications related to bowel motility Outcome: Adequate for Discharge Goal: Will not experience complications related to urinary retention Outcome: Adequate for Discharge   Problem: Pain Managment: Goal: General experience of comfort will improve Outcome: Adequate for Discharge   Problem: Safety: Goal: Ability to remain free from injury will improve Outcome: Adequate for Discharge   Problem: Skin  Integrity: Goal: Risk for impaired skin integrity will decrease Outcome: Adequate for Discharge   Problem: Education: Goal: Knowledge of the prescribed therapeutic regimen will improve Outcome: Adequate for Discharge Goal: Individualized Educational Video(s) Outcome: Adequate for Discharge   Problem: Activity: Goal: Ability to avoid complications of mobility impairment will improve Outcome: Adequate for Discharge Goal: Range of joint motion will improve Outcome: Adequate for Discharge   Problem: Clinical Measurements: Goal: Postoperative complications will be avoided or minimized Outcome: Adequate for Discharge   Problem: Pain Management: Goal: Pain level will decrease with appropriate interventions Outcome: Adequate for Discharge   Problem: Skin Integrity: Goal: Will show signs of wound healing Outcome: Adequate for Discharge

## 2020-07-07 NOTE — Plan of Care (Signed)
Patient is delirious, has been awake most of the night trying to get out of the bed. Knee Immobilizer applied since patient is not redirectable. Ice applied to right knee . Tylenol given for pain.  Problem: Education: Goal: Knowledge of General Education information will improve Description: Including pain rating scale, medication(s)/side effects and non-pharmacologic comfort measures Outcome: Not Progressing   Problem: Health Behavior/Discharge Planning: Goal: Ability to manage health-related needs will improve Outcome: Not Progressing   Problem: Clinical Measurements: Goal: Ability to maintain clinical measurements within normal limits will improve Outcome: Not Progressing Goal: Will remain free from infection Outcome: Not Progressing Goal: Diagnostic test results will improve Outcome: Not Progressing Goal: Respiratory complications will improve Outcome: Not Progressing Goal: Cardiovascular complication will be avoided Outcome: Not Progressing   Problem: Activity: Goal: Risk for activity intolerance will decrease Outcome: Not Progressing   Problem: Nutrition: Goal: Adequate nutrition will be maintained Outcome: Not Progressing   Problem: Coping: Goal: Level of anxiety will decrease Outcome: Not Progressing   Problem: Elimination: Goal: Will not experience complications related to bowel motility Outcome: Not Progressing Goal: Will not experience complications related to urinary retention Outcome: Not Progressing   Problem: Pain Managment: Goal: General experience of comfort will improve Outcome: Not Progressing   Problem: Safety: Goal: Ability to remain free from injury will improve Outcome: Not Progressing   Problem: Skin Integrity: Goal: Risk for impaired skin integrity will decrease Outcome: Not Progressing   Problem: Education: Goal: Knowledge of the prescribed therapeutic regimen will improve Outcome: Not Progressing Goal: Individualized Educational  Video(s) Outcome: Not Progressing   Problem: Activity: Goal: Ability to avoid complications of mobility impairment will improve Outcome: Not Progressing Goal: Range of joint motion will improve Outcome: Not Progressing   Problem: Clinical Measurements: Goal: Postoperative complications will be avoided or minimized Outcome: Not Progressing   Problem: Pain Management: Goal: Pain level will decrease with appropriate interventions Outcome: Not Progressing   Problem: Skin Integrity: Goal: Will show signs of wound healing Outcome: Not Progressing

## 2020-07-07 NOTE — Progress Notes (Signed)
Physical Therapy Treatment Patient Details Name: [name] MRN: [id number] DOB: Oct 09, 1940 Today's Date: 07/07/2020    History of Present Illness Pt is a 79 y/o female s/p R TKA on 07/05/20. PMH includes chronic dislocated L knee, L TKA, R THA, CAD, HTN, PVD.   PT Comments    Pt progressing well with mobility. Tolerated multiple standing trials with and without Stedy frame, requiring maxA+2 for trunk elevation and to prevent LE buckling; also able to perform seated BLE therex. Pt remains limited by fatigue, muscle weakness and decreased activity tolerance, requiring frequent rest breaks between bouts of activity. Pt remains pleasantly confused. Daughter-in-law present and supportive. Hopeful for d/c to SNF. Will continue to follow acutely.    Follow Up Recommendations  SNF;Supervision for mobility/OOB;Follow surgeon's recommendation for DC plan and follow-up therapies     Equipment Recommendations  None recommended by PT    Recommendations for Other Services       Precautions / Restrictions Precautions Precautions: Knee;Fall;Other (comment) Precaution Comments: Urinary incontinence/frequency; L knee with chronic dislocation Restrictions Weight Bearing Restrictions: Yes RLE Weight Bearing: Weight bearing as tolerated    Mobility  Bed Mobility Overal bed mobility: Needs Assistance Bed Mobility: Supine to Sit     Supine to sit: Mod assist     General bed mobility comments: ModA for RLE management and trunk elevation; HOB elevated and cues to use of bed rail  Transfers Overall transfer level: Needs assistance Equipment used: Ambulation equipment used;2 person hand held assist Transfers: Sit to/from Stand Sit to Stand: Max assist;+2 physical assistance         General transfer comment: Initial stand from EOB with Stedy frame, maxA+2 to elevate trunk and achieve fully upright posture, cues for sequencing; 2x standing from recliner with bilat HHA, pt requiring maxA to  block L knee from extending out and maxA+2 to achieve fully upright posture; max, multimodal cues for knee flexion and anterior weight translation in preparation to stands; pt at times performing this automatically  Ambulation/Gait                 Stairs             Wheelchair Mobility    Modified Rankin (Stroke Patients Only)       Balance Overall balance assessment: Needs assistance Sitting-balance support: No upper extremity supported;Feet supported Sitting balance-Leahy Scale: Fair     Standing balance support: Bilateral upper extremity supported;During functional activity Standing balance-Leahy Scale: Zero Standing balance comment: Reliant on BUE support and external assist standing fully upright in stedy frame                            Cognition Arousal/Alertness: Awake/alert Behavior During Therapy: WFL for tasks assessed/performed Overall Cognitive Status: Impaired/Different from baseline Area of Impairment: Attention;Memory;Following commands;Awareness;Problem solving                   Current Attention Level: Sustained;Selective Memory: Decreased short-term memory Following Commands: Follows one step commands inconsistently;Follows one step commands with increased time   Awareness: Intellectual;Emergent Problem Solving: Requires verbal cues;Requires tactile cues;Difficulty sequencing General Comments: Pt answering orientation questions appropriately (although still referencing something as if she was home), but with more notable confusion this session; likely exacerbated by fatigue. Daughter-in-law present and reports pt typically "sharp" and she notes a difference. Suspect due to post-op/anesthesia/pain meds      Exercises Other Exercises Other Exercises: Seated AROM/AAROM bilateral knee flex/ext;  AROM heel/toe raises, seated marching    General Comments General comments (skin integrity, edema, etc.): Daughter-in-law present and  supportive      Pertinent Vitals/Pain Pain Assessment: Faces Faces Pain Scale: Hurts even more Pain Location: R knee > L knee, especially with ROM Pain Descriptors / Indicators: Operative site guarding;Discomfort;Grimacing Pain Intervention(s): Monitored during session;Limited activity within patient's tolerance;Repositioned;Ice applied    Home Living                      Prior Function            PT Goals (current goals can now be found in the care plan section) Progress towards PT goals: Progressing toward goals    Frequency    7X/week      PT Plan Current plan remains appropriate    Co-evaluation              AM-PAC PT "6 Clicks" Mobility   Outcome Measure  Help needed turning from your back to your side while in a flat bed without using bedrails?: A Lot Help needed moving from lying on your back to sitting on the side of a flat bed without using bedrails?: A Lot Help needed moving to and from a bed to a chair (including a wheelchair)?: Total Help needed standing up from a chair using your arms (e.g., wheelchair or bedside chair)?: A Lot Help needed to walk in hospital room?: Total Help needed climbing 3-5 steps with a railing? : Total 6 Click Score: 9    End of Session Equipment Utilized During Treatment: Gait belt Activity Tolerance: Patient tolerated treatment well Patient left: in chair;with call bell/phone within reach;with chair alarm set;with family/visitor present Nurse Communication: Mobility status;Need for lift equipment PT Visit Diagnosis: Unsteadiness on feet (R26.81);Difficulty in walking, not elsewhere classified (R26.2)     Time: 4166-0630 PT Time Calculation (min) (ACUTE ONLY): 28 min  Charges:  $Therapeutic Exercise: 8-22 mins $Therapeutic Activity: 8-22 mins                    [name], PT, DPT Acute Rehabilitation Services  Pager [phone] Office [phone]  Derry Lory 07/07/2020, 12:50 PM

## 2020-07-07 NOTE — TOC Transition Note (Signed)
Transition of Care [facility]) - CM/SW Discharge Note   Patient Details  Name: [name] MRN: [id number] Date of Birth: 1940-11-27  [facility] CM/SW Contact:  [name], RN Phone Number: 07/07/2020, 12:49 PM   Clinical Narrative:    Patient will DC to: [facility]. Anticipated DC date: 07/07/2020 Family notified: yes, daughter in Systems developer by: PTAR  SNF bed offer extended by [facility] and pt accepted. Per MD patient ready for DC today . RN, patient, patient's family, and facility notified of DC. Discharge Summary and FL2 sent to facility. RN to call report prior to discharge ([phone]). Rm#[id number] packet on chart. Ambulance transport requested for patient.   RNCM will sign off for now as intervention is no longer needed. Please consult [place] again if new needs arise.    Final next level of care: Skilled Nursing Facility Barriers to Discharge: No Barriers Identified   Patient Goals and CMS Choice Patient states their goals for this hospitalization and ongoing recovery are:: to get better and go home CMS Medicare.gov Compare Post Acute Care list provided to:: Patient    Discharge Placement                       Discharge Plan and Services   Discharge Planning Services: CM Consult            DME Arranged:  (No DME needed)                    Social Determinants of Health (SDOH) Interventions     Readmission Risk Interventions No flowsheet data found.

## 2020-07-08 ENCOUNTER — Telehealth: Payer: Self-pay | Admitting: *Deleted

## 2020-07-08 DIAGNOSIS — I10 Essential (primary) hypertension: Secondary | ICD-10-CM | POA: Diagnosis not present

## 2020-07-08 DIAGNOSIS — Z471 Aftercare following joint replacement surgery: Secondary | ICD-10-CM | POA: Diagnosis not present

## 2020-07-08 DIAGNOSIS — Z96651 Presence of right artificial knee joint: Secondary | ICD-10-CM | POA: Diagnosis not present

## 2020-07-08 DIAGNOSIS — I2510 Atherosclerotic heart disease of native coronary artery without angina pectoris: Secondary | ICD-10-CM | POA: Diagnosis not present

## 2020-07-08 DIAGNOSIS — M1711 Unilateral primary osteoarthritis, right knee: Secondary | ICD-10-CM | POA: Diagnosis not present

## 2020-07-08 DIAGNOSIS — Z7409 Other reduced mobility: Secondary | ICD-10-CM | POA: Diagnosis not present

## 2020-07-08 DIAGNOSIS — Z789 Other specified health status: Secondary | ICD-10-CM | POA: Diagnosis not present

## 2020-07-08 NOTE — Telephone Encounter (Signed)
Ortho bundle D/C call completed. 

## 2020-07-15 ENCOUNTER — Telehealth: Payer: Self-pay | Admitting: *Deleted

## 2020-07-15 NOTE — Telephone Encounter (Signed)
7 day Ortho bundle call completed. 

## 2020-07-18 ENCOUNTER — Encounter: Payer: Self-pay | Admitting: Orthopaedic Surgery

## 2020-07-18 ENCOUNTER — Telehealth: Payer: Self-pay | Admitting: *Deleted

## 2020-07-18 ENCOUNTER — Ambulatory Visit (INDEPENDENT_AMBULATORY_CARE_PROVIDER_SITE_OTHER): Payer: Medicare Other | Admitting: Orthopaedic Surgery

## 2020-07-18 DIAGNOSIS — Z96651 Presence of right artificial knee joint: Secondary | ICD-10-CM

## 2020-07-18 NOTE — Telephone Encounter (Signed)
Ortho bundle 14 day call completed. 

## 2020-07-18 NOTE — Progress Notes (Signed)
The patient comes in today 2 weeks status post a right total knee arthroplasty.  She has been staying at a short-term skilled nursing facility as she recovers from the surgery.  Family is with her today.  On exam she lacks full extension by few degrees but I can flex her to 90 degrees.  Her calf is soft on the right side.  Her incision looks good.  I removed the staples from her right knee and placed Steri-Strips.  A long thorough discussion about transitioning from skilled nursing to home with outpatient physical therapy.  Hopefully this can happen soon.  She will continue to work on her balance and coordination as well as strengthening and range of motion of her right knee.  I will see her back in 4 weeks to see how she is doing overall but no x-rays are needed.  If there are any issues before then they will let us know.  She has a history of a chronic left knee infection.

## 2020-08-05 ENCOUNTER — Other Ambulatory Visit: Payer: Self-pay | Admitting: Internal Medicine

## 2020-08-06 DIAGNOSIS — M810 Age-related osteoporosis without current pathological fracture: Secondary | ICD-10-CM | POA: Diagnosis not present

## 2020-08-06 DIAGNOSIS — R4189 Other symptoms and signs involving cognitive functions and awareness: Secondary | ICD-10-CM | POA: Diagnosis not present

## 2020-08-06 DIAGNOSIS — Z87891 Personal history of nicotine dependence: Secondary | ICD-10-CM | POA: Diagnosis not present

## 2020-08-06 DIAGNOSIS — I739 Peripheral vascular disease, unspecified: Secondary | ICD-10-CM | POA: Diagnosis not present

## 2020-08-06 DIAGNOSIS — K529 Noninfective gastroenteritis and colitis, unspecified: Secondary | ICD-10-CM | POA: Diagnosis not present

## 2020-08-06 DIAGNOSIS — Z96643 Presence of artificial hip joint, bilateral: Secondary | ICD-10-CM | POA: Diagnosis not present

## 2020-08-06 DIAGNOSIS — I420 Dilated cardiomyopathy: Secondary | ICD-10-CM | POA: Diagnosis not present

## 2020-08-06 DIAGNOSIS — N139 Obstructive and reflux uropathy, unspecified: Secondary | ICD-10-CM | POA: Diagnosis not present

## 2020-08-06 DIAGNOSIS — I723 Aneurysm of iliac artery: Secondary | ICD-10-CM | POA: Diagnosis not present

## 2020-08-06 DIAGNOSIS — E785 Hyperlipidemia, unspecified: Secondary | ICD-10-CM | POA: Diagnosis not present

## 2020-08-06 DIAGNOSIS — Z87442 Personal history of urinary calculi: Secondary | ICD-10-CM | POA: Diagnosis not present

## 2020-08-06 DIAGNOSIS — I119 Hypertensive heart disease without heart failure: Secondary | ICD-10-CM | POA: Diagnosis not present

## 2020-08-06 DIAGNOSIS — Z7982 Long term (current) use of aspirin: Secondary | ICD-10-CM | POA: Diagnosis not present

## 2020-08-06 DIAGNOSIS — I779 Disorder of arteries and arterioles, unspecified: Secondary | ICD-10-CM | POA: Diagnosis not present

## 2020-08-06 DIAGNOSIS — Z471 Aftercare following joint replacement surgery: Secondary | ICD-10-CM | POA: Diagnosis not present

## 2020-08-06 DIAGNOSIS — J449 Chronic obstructive pulmonary disease, unspecified: Secondary | ICD-10-CM | POA: Diagnosis not present

## 2020-08-06 DIAGNOSIS — Z96653 Presence of artificial knee joint, bilateral: Secondary | ICD-10-CM | POA: Diagnosis not present

## 2020-08-06 DIAGNOSIS — Q324 Other congenital malformations of bronchus: Secondary | ICD-10-CM | POA: Diagnosis not present

## 2020-08-06 DIAGNOSIS — I714 Abdominal aortic aneurysm, without rupture: Secondary | ICD-10-CM | POA: Diagnosis not present

## 2020-08-06 DIAGNOSIS — I2510 Atherosclerotic heart disease of native coronary artery without angina pectoris: Secondary | ICD-10-CM | POA: Diagnosis not present

## 2020-08-08 ENCOUNTER — Ambulatory Visit (HOSPITAL_BASED_OUTPATIENT_CLINIC_OR_DEPARTMENT_OTHER): Payer: Medicare Other

## 2020-08-08 ENCOUNTER — Other Ambulatory Visit (HOSPITAL_BASED_OUTPATIENT_CLINIC_OR_DEPARTMENT_OTHER): Payer: Medicare Other

## 2020-08-08 ENCOUNTER — Other Ambulatory Visit: Payer: Self-pay

## 2020-08-08 MED ORDER — DOXYCYCLINE HYCLATE 100 MG PO TABS
100.0000 mg | ORAL_TABLET | Freq: Two times a day (BID) | ORAL | 1 refills | Status: DC
Start: 2020-08-08 — End: 2020-10-10

## 2020-08-10 ENCOUNTER — Telehealth: Payer: Self-pay | Admitting: Orthopaedic Surgery

## 2020-08-10 DIAGNOSIS — I119 Hypertensive heart disease without heart failure: Secondary | ICD-10-CM | POA: Diagnosis not present

## 2020-08-10 DIAGNOSIS — M810 Age-related osteoporosis without current pathological fracture: Secondary | ICD-10-CM | POA: Diagnosis not present

## 2020-08-10 DIAGNOSIS — I739 Peripheral vascular disease, unspecified: Secondary | ICD-10-CM | POA: Diagnosis not present

## 2020-08-10 DIAGNOSIS — I2510 Atherosclerotic heart disease of native coronary artery without angina pectoris: Secondary | ICD-10-CM | POA: Diagnosis not present

## 2020-08-10 DIAGNOSIS — E785 Hyperlipidemia, unspecified: Secondary | ICD-10-CM | POA: Diagnosis not present

## 2020-08-10 DIAGNOSIS — Z471 Aftercare following joint replacement surgery: Secondary | ICD-10-CM | POA: Diagnosis not present

## 2020-08-10 NOTE — Telephone Encounter (Signed)
[name] caled and was requesting orders for [name]. Please call her at [phone]

## 2020-08-10 NOTE — Telephone Encounter (Signed)
Verbal order cancelled

## 2020-08-12 DIAGNOSIS — E785 Hyperlipidemia, unspecified: Secondary | ICD-10-CM | POA: Diagnosis not present

## 2020-08-12 DIAGNOSIS — I2510 Atherosclerotic heart disease of native coronary artery without angina pectoris: Secondary | ICD-10-CM | POA: Diagnosis not present

## 2020-08-12 DIAGNOSIS — M810 Age-related osteoporosis without current pathological fracture: Secondary | ICD-10-CM | POA: Diagnosis not present

## 2020-08-12 DIAGNOSIS — Z471 Aftercare following joint replacement surgery: Secondary | ICD-10-CM | POA: Diagnosis not present

## 2020-08-12 DIAGNOSIS — I739 Peripheral vascular disease, unspecified: Secondary | ICD-10-CM | POA: Diagnosis not present

## 2020-08-12 DIAGNOSIS — I119 Hypertensive heart disease without heart failure: Secondary | ICD-10-CM | POA: Diagnosis not present

## 2020-08-15 ENCOUNTER — Ambulatory Visit: Payer: Medicare Other | Admitting: Orthopaedic Surgery

## 2020-08-18 ENCOUNTER — Telehealth: Payer: Self-pay | Admitting: Orthopaedic Surgery

## 2020-08-18 ENCOUNTER — Telehealth: Payer: Self-pay | Admitting: *Deleted

## 2020-08-18 DIAGNOSIS — E785 Hyperlipidemia, unspecified: Secondary | ICD-10-CM | POA: Diagnosis not present

## 2020-08-18 DIAGNOSIS — M810 Age-related osteoporosis without current pathological fracture: Secondary | ICD-10-CM | POA: Diagnosis not present

## 2020-08-18 DIAGNOSIS — I2510 Atherosclerotic heart disease of native coronary artery without angina pectoris: Secondary | ICD-10-CM | POA: Diagnosis not present

## 2020-08-18 DIAGNOSIS — I119 Hypertensive heart disease without heart failure: Secondary | ICD-10-CM | POA: Diagnosis not present

## 2020-08-18 DIAGNOSIS — Z471 Aftercare following joint replacement surgery: Secondary | ICD-10-CM | POA: Diagnosis not present

## 2020-08-18 DIAGNOSIS — I739 Peripheral vascular disease, unspecified: Secondary | ICD-10-CM | POA: Diagnosis not present

## 2020-08-18 NOTE — Care Plan (Signed)
Call to patient to check status after discharge from SNF 2 weeks ago and receiving at home PT. She states she is staying in her own home and feels she is doing well. Discussed compression stockings as she has called into office and asked about these. Instructed if leg/ankle is swelling, then to wear during the day, but take off at night. She states she feels she may be ready for OPPT. Family could drive her. CM called to [facility] to discuss plan. Treating therapist will be contacted by [facility] liaison and get back with [facility] on plan of care. MD appt Monday with Dr. Ninfa Linden...will discuss starting OPPT with him at this appt.

## 2020-08-18 NOTE — Telephone Encounter (Signed)
Definitely ice and elevation over the next few days and staying off of it when she can.

## 2020-08-18 NOTE — Telephone Encounter (Signed)
Patient aware of the below message  

## 2020-08-18 NOTE — Telephone Encounter (Signed)
See below Ice and elevate? Recent total replacement

## 2020-08-18 NOTE — Telephone Encounter (Signed)
30 day Ortho bundle call to patient.

## 2020-08-18 NOTE — Telephone Encounter (Signed)
Patient called. She would like Dr. Ninfa Linden to know that her ankle is swollen. Would like to know what to do. Her call back number is [phone]

## 2020-08-19 DIAGNOSIS — I739 Peripheral vascular disease, unspecified: Secondary | ICD-10-CM | POA: Diagnosis not present

## 2020-08-19 DIAGNOSIS — I2510 Atherosclerotic heart disease of native coronary artery without angina pectoris: Secondary | ICD-10-CM | POA: Diagnosis not present

## 2020-08-19 DIAGNOSIS — M810 Age-related osteoporosis without current pathological fracture: Secondary | ICD-10-CM | POA: Diagnosis not present

## 2020-08-19 DIAGNOSIS — Z471 Aftercare following joint replacement surgery: Secondary | ICD-10-CM | POA: Diagnosis not present

## 2020-08-19 DIAGNOSIS — I119 Hypertensive heart disease without heart failure: Secondary | ICD-10-CM | POA: Diagnosis not present

## 2020-08-19 DIAGNOSIS — E785 Hyperlipidemia, unspecified: Secondary | ICD-10-CM | POA: Diagnosis not present

## 2020-08-22 ENCOUNTER — Encounter: Payer: Self-pay | Admitting: Orthopaedic Surgery

## 2020-08-22 ENCOUNTER — Telehealth: Payer: Self-pay | Admitting: *Deleted

## 2020-08-22 ENCOUNTER — Ambulatory Visit (INDEPENDENT_AMBULATORY_CARE_PROVIDER_SITE_OTHER): Payer: Medicare Other | Admitting: Orthopaedic Surgery

## 2020-08-22 DIAGNOSIS — Z96651 Presence of right artificial knee joint: Secondary | ICD-10-CM

## 2020-08-22 NOTE — Progress Notes (Signed)
The patient is now between 6 and 7 weeks status post a right total knee arthroplasty.  She reports that she is doing well. She has developed some right ankle and foot swelling just over the last day.  She says the swelling does dissipate overnight.  She cannot put on compressive garments well.  She is not on blood thinning medication.  Examination of her right operative knee shows her extension is almost full and her flexion is to at least 100 degrees.  The knee feels loose is stable.  There is some foot and ankle swelling but no pitting edema.  Her calf is soft.  From my standpoint, I do not need to see her back for 4 weeks unless she is having issues.  If there is still persistent swelling next week they will call because we would certainly consider a Doppler ultrasound to rule out a DVT.  She will continue home health physical therapy since she is doing so well.  At her next visit I would like an AP and lateral of both her right and left knee.  The left knee has a history of multiple revision types of surgeries and we want to get a better understanding of what is going on with that knee.

## 2020-08-22 NOTE — Telephone Encounter (Signed)
Ortho bundle in person meeting during 6 week appt.

## 2020-08-23 DIAGNOSIS — I119 Hypertensive heart disease without heart failure: Secondary | ICD-10-CM | POA: Diagnosis not present

## 2020-08-23 DIAGNOSIS — Z471 Aftercare following joint replacement surgery: Secondary | ICD-10-CM | POA: Diagnosis not present

## 2020-08-23 DIAGNOSIS — I739 Peripheral vascular disease, unspecified: Secondary | ICD-10-CM | POA: Diagnosis not present

## 2020-08-23 DIAGNOSIS — M810 Age-related osteoporosis without current pathological fracture: Secondary | ICD-10-CM | POA: Diagnosis not present

## 2020-08-23 DIAGNOSIS — E785 Hyperlipidemia, unspecified: Secondary | ICD-10-CM | POA: Diagnosis not present

## 2020-08-23 DIAGNOSIS — I2510 Atherosclerotic heart disease of native coronary artery without angina pectoris: Secondary | ICD-10-CM | POA: Diagnosis not present

## 2020-08-29 DIAGNOSIS — I2510 Atherosclerotic heart disease of native coronary artery without angina pectoris: Secondary | ICD-10-CM | POA: Diagnosis not present

## 2020-08-29 DIAGNOSIS — Z471 Aftercare following joint replacement surgery: Secondary | ICD-10-CM | POA: Diagnosis not present

## 2020-08-29 DIAGNOSIS — I119 Hypertensive heart disease without heart failure: Secondary | ICD-10-CM | POA: Diagnosis not present

## 2020-08-29 DIAGNOSIS — I739 Peripheral vascular disease, unspecified: Secondary | ICD-10-CM | POA: Diagnosis not present

## 2020-08-29 DIAGNOSIS — E785 Hyperlipidemia, unspecified: Secondary | ICD-10-CM | POA: Diagnosis not present

## 2020-08-29 DIAGNOSIS — M810 Age-related osteoporosis without current pathological fracture: Secondary | ICD-10-CM | POA: Diagnosis not present

## 2020-09-02 DIAGNOSIS — I119 Hypertensive heart disease without heart failure: Secondary | ICD-10-CM | POA: Diagnosis not present

## 2020-09-02 DIAGNOSIS — Z471 Aftercare following joint replacement surgery: Secondary | ICD-10-CM | POA: Diagnosis not present

## 2020-09-02 DIAGNOSIS — I2510 Atherosclerotic heart disease of native coronary artery without angina pectoris: Secondary | ICD-10-CM | POA: Diagnosis not present

## 2020-09-02 DIAGNOSIS — E785 Hyperlipidemia, unspecified: Secondary | ICD-10-CM | POA: Diagnosis not present

## 2020-09-02 DIAGNOSIS — M810 Age-related osteoporosis without current pathological fracture: Secondary | ICD-10-CM | POA: Diagnosis not present

## 2020-09-02 DIAGNOSIS — I739 Peripheral vascular disease, unspecified: Secondary | ICD-10-CM | POA: Diagnosis not present

## 2020-09-19 ENCOUNTER — Ambulatory Visit (INDEPENDENT_AMBULATORY_CARE_PROVIDER_SITE_OTHER): Payer: Medicare Other | Admitting: Orthopaedic Surgery

## 2020-09-19 ENCOUNTER — Ambulatory Visit (INDEPENDENT_AMBULATORY_CARE_PROVIDER_SITE_OTHER): Payer: Medicare Other

## 2020-09-19 ENCOUNTER — Other Ambulatory Visit: Payer: Self-pay

## 2020-09-19 ENCOUNTER — Ambulatory Visit: Payer: Self-pay

## 2020-09-19 ENCOUNTER — Encounter: Payer: Self-pay | Admitting: Orthopaedic Surgery

## 2020-09-19 DIAGNOSIS — Z96651 Presence of right artificial knee joint: Secondary | ICD-10-CM

## 2020-09-19 DIAGNOSIS — Z96652 Presence of left artificial knee joint: Secondary | ICD-10-CM

## 2020-09-19 DIAGNOSIS — M25562 Pain in left knee: Secondary | ICD-10-CM

## 2020-09-19 NOTE — Progress Notes (Signed)
Office Visit Note   Patient: [name]           Date of Birth: 1941-06-30           MRN: [id number] Visit Date: 09/19/2020              Requested by: Darreld Mclean, MD [address] [place],  [place] [address] PCP: Darreld Mclean, MD   Assessment & Plan: Visit Diagnoses:  1. S/P TKR (total knee replacement), right   2. Left knee pain, unspecified chronicity   3. History of total left knee replacement     Plan: I am very pleased thus far with her right knee replacement.  She should continue to do well with this knee.  She can continue her compressive garments and wear them even at night if she would like.  We will at least try a short hinged knee brace for her left knee for stability purposes only.  I would like to see her back in 4 weeks to see how she is doing overall.  Follow-Up Instructions: Return in about 4 weeks (around 10/17/2020).   Orders:  Orders Placed This Encounter  Procedures  . XR Knee 1-2 Views Left  . XR Knee 1-2 Views Right   No orders of the defined types were placed in this encounter.     Procedures: No procedures performed   Clinical Data: No additional findings.   Subjective: Chief Complaint  Patient presents with  . Right Knee - Routine Post Op  The patient is now just over 2 months status post a right total knee arthroplasty.  She is 80 years old.  She does report some knee swelling but said the knee is doing well.  Her left knee has a chronically subluxed patella from a knee replacement was done years ago by someone else.  That knee unfortunately was infected postoperative.  They ended up having to do debridements and then a flap closure.  The patella subluxes laterally and the knee is in valgus malalignment.  This is what bothers her the most.  She does report right knee and leg swelling that is appropriate postoperative.  She denies much pain in her right operative knee and does wear compressive hose but only  during the daytime.  HPI  Review of Systems There is currently listed no headache, chest pain, shortness of breath, fever, chills, nausea, vomiting  Objective: Vital Signs: There were no vitals taken for this visit.  Physical Exam She is alert and orient x3 and in no acute distress Ortho Exam Examination of her right most recent operative knee shows a well-healed surgical incision.  Her extension is full and her flexion is almost full.  The knee feels ligamentously stable.  The swelling of her knee and leg is appropriate.  Her calf is soft.  Examination of her left knee shows significant limitations in motion of that knee.  The patella is chronically subluxed and there is a well-healed flap and surgical incision.  Range of motion is limited. Specialty Comments:  No specialty comments available.  Imaging: XR Knee 1-2 Views Left  Result Date: 09/19/2020 Two views of the left knee show a knee replacement with chronic valgus instability with opening of the medial aspect of the knee and a chronically subluxed patella.  The bone is osteopenic.  XR Knee 1-2 Views Right  Result Date: 09/19/2020 Two views of the right knee show a well-seated total knee arthroplasty with anatomic alignment  and no complicating features.    PMFS History: Patient Active Problem List   Diagnosis Date Noted  . Status post total right knee replacement 07/05/2020  . Syncope 04/01/2019  . Closed fracture of multiple pubic rami ([place]) 07/22/2018  . Closed fracture of right superior rim of pubis ([place])   . Lateral dislocation of left patella 01/15/2017  . Unilateral primary osteoarthritis, right knee 01/15/2017  . Osteoporosis 11/26/2016  . Acute pain of left knee 08/27/2016  . Chronic diarrhea 08/07/2016  . Left displaced femoral neck fracture ([place]) 06/26/2016  . MRSA (methicillin resistant Staphylococcus aureus) infection 06/26/2016  . Hypertensive heart disease   . Hydronephrosis   . Obstructive uropathy    . Abnormal echocardiogram 01/23/2016  . Pressure ulcer 01/21/2016  . Chronic infection of prosthetic knee ([place]) 10/12/2015  . AAA 01/19/2009  . ILIAC ARTERY ANEURYSM 01/19/2009  . Hyperlipidemia 01/14/2009  . TOBACCO ABUSE 01/14/2009  . Essential hypertension 01/14/2009  . Coronary atherosclerosis 01/14/2009  . Cerebrovascular disease 01/14/2009  . Peripheral vascular disease ([place]) 01/14/2009  . CHRONIC OBSTRUCTIVE PULMONARY DISEASE 01/14/2009   Past Medical History:  Diagnosis Date  . Arthritis   . CAD (coronary artery disease)    a. 02/2007 s/p PCI/DES ot LCX and RCA;  b. 07/2014 MV: no ischemia/infarct, EF 68%.  . Carotid arterial disease ([place])    a. 05/2015 Carotid U/S: RICA 0-16%, LICA 01-09%, RECA 3-23%, LECA >50% - overall stable, f/u 1 yr.  . Complication of anesthesia    Three days after procedure pt statrted to suffer with memoy loss that took about 4-5 days to come back according to [name]."  . Dilated cardiomyopathy ([place])    a. 05/2005 Echo: EF 65%; b. 01/22/2016 Echo: EF 25-30% (in setting of admission for urosepsis w/ elev trop); c. 01/26/2016 Echo: EF 35-40%, inf, distal apical, apical, and mid to distal septal HK-->felt to be 2/2 stress induced CM.  [name] Heart murmur    mild AS 04/2020  . History of bronchitis   . Hyperlipidemia   . Hypertension   . Hypertensive heart disease   . Memory impairment   . MRSA (methicillin resistant staph aureus) culture positive   . Nephrolithiasis    a. 12/2015 UVJ stone w/ hydroureteronephrosis req nephrostomy tube placement.  . Osteoarthritis    a. 11/5730 s/p L TKA complicated by infections req skin grafting.  [name] PVD (peripheral vascular disease) ([place])    a. Bilat common iliac dzs, nl ABI's --> med rx.  . Sepsis ([place])    a. 12/2015 admitted with Proteus mirabilis urosepsis/bacteremia.  . Wears glasses     Family History  Problem Relation Age of Onset  . Breast cancer Mother   . Leukemia Father   . Stroke Sister   . Bone  cancer Other     Past Surgical History:  Procedure Laterality Date  . ABDOMINAL HYSTERECTOMY    . APPLICATION OF A-CELL OF EXTREMITY Left 12/08/2015   Procedure: APPLICATION OF A-CELL OF knee;  Surgeon: Loel Lofty [name], DO;  Location: [facility];  Service: Plastics;  Laterality: Left;  . APPLICATION OF WOUND VAC Left 11/16/2015   Procedure: APPLICATION OF WOUND VAC;  Surgeon: Loel Lofty [name], DO;  Location: [facility];  Service: Plastics;  Laterality: Left;  . APPLICATION OF WOUND VAC Left 12/08/2015   Procedure: APPLICATION OF WOUND VAC  AND CAST to knee;  Surgeon: Loel Lofty [name], DO;  Location: [facility];  Service: Plastics;  Laterality: Left;  .  COLONOSCOPY    . CORONARY ANGIOPLASTY  07   3 stents placed  . ESOPHAGOGASTRODUODENOSCOPY    . EYE SURGERY Bilateral    cataracts  . FREE FLAP TO EXTREMITY Left 11/16/2015   Procedure: FREE FLAP TO EXTREMITY;  Surgeon: Loel Lofty [name], DO;  Location: [facility];  Service: Plastics;  Laterality: Left;  . I & D EXTREMITY Left 10/18/2015   Procedure: Left Knee Washout, Reclosure;  Surgeon: Newt Minion, MD;  Location: [facility];  Service: Orthopedics;  Laterality: Left;  . I & D EXTREMITY Left 12/08/2015   Procedure: IRRIGATION AND DEBRIDEMENT knee;  Surgeon: Loel Lofty [name], DO;  Location: [facility];  Service: Plastics;  Laterality: Left;  . I & D KNEE WITH POLY EXCHANGE Left 10/12/2015   Procedure: Irrigation and Debridement , Poly Exchange, Antibiotic Beads Left Knee;  Surgeon: Newt Minion, MD;  Location: [facility];  Service: Orthopedics;  Laterality: Left;  . INCISION AND DRAINAGE OF WOUND Left 01/04/2016   Procedure: DEBRIDEMENT OF LEFT KNEE WOUND WITH ACELL AND WOUND VAC PLACEMENT;  Surgeon: Loel Lofty [name], DO;  Location: [facility];  Service: Plastics;  Laterality: Left;  . IR GENERIC HISTORICAL  05/11/2016   IR FLUORO GUIDE CV MIDLINE PICC RIGHT 05/11/2016 Sandi Mariscal, MD [facility]  . IR GENERIC HISTORICAL  05/11/2016   IR US GUIDE VASC ACCESS RIGHT  05/11/2016 Sandi Mariscal, MD [facility]  . KNEE CLOSED REDUCTION Left 01/16/2017   Procedure: CLOSED MANIPULATION LEFT KNEE;  Surgeon: Leandrew Koyanagi, MD;  Location: [facility];  Service: Orthopedics;  Laterality: Left;  . KNEE SURGERY     denies  . SKIN SPLIT GRAFT Left 11/16/2015   Procedure: LEFT GASTROC MUSCLE FLAP WITH SKIN GRAFT SPLIT THICKNESS AND VAC PLACEMENT;  Surgeon: Loel Lofty [name], DO;  Location: [facility];  Service: Plastics;  Laterality: Left;  . TOTAL HIP ARTHROPLASTY Right 09/10/2012   Dr Sharol Given  . TOTAL HIP ARTHROPLASTY Right 09/10/2012   Procedure: TOTAL HIP ARTHROPLASTY;  Surgeon: Newt Minion, MD;  Location: [facility];  Service: Orthopedics;  Laterality: Right;  Right Total Hip Arthroplasty  . TOTAL HIP ARTHROPLASTY Left 06/27/2016   Procedure: LEFT TOTAL HIP ARTHROPLASTY ANTERIOR APPROACH;  Surgeon: Leandrew Koyanagi, MD;  Location: [facility];  Service: Orthopedics;  Laterality: Left;  . TOTAL KNEE ARTHROPLASTY Left 09/28/2015   Procedure: TOTAL KNEE ARTHROPLASTY;  Surgeon: Newt Minion, MD;  Location: [facility];  Service: Orthopedics;  Laterality: Left;  . TOTAL KNEE ARTHROPLASTY Right 07/05/2020  . TOTAL KNEE ARTHROPLASTY Right 07/05/2020   Procedure: RIGHT TOTAL KNEE ARTHROPLASTY;  Surgeon: Mcarthur Rossetti, MD;  Location: [facility];  Service: Orthopedics;  Laterality: Right;   Social History   Occupational History  . Occupation: Retired  Tobacco Use  . Smoking status: Former Smoker    Packs/day: 0.50    Years: 50.00    Pack years: 25.00    Types: Cigarettes    Quit date: 09/28/2015    Years since quitting: 4.9  . Smokeless tobacco: Never Used  . Tobacco comment: quit 09/27/15  Vaping Use  . Vaping Use: Never used  Substance and Sexual Activity  . Alcohol use: No  . Drug use: No  . Sexual activity: Not Currently

## 2020-10-10 ENCOUNTER — Other Ambulatory Visit: Payer: Self-pay | Admitting: Internal Medicine

## 2020-10-10 ENCOUNTER — Other Ambulatory Visit: Payer: Self-pay

## 2020-10-10 DIAGNOSIS — Z96659 Presence of unspecified artificial knee joint: Secondary | ICD-10-CM

## 2020-10-10 MED ORDER — DOXYCYCLINE HYCLATE 100 MG PO TABS: 100.0000 mg | ORAL_TABLET | Freq: Two times a day (BID) | ORAL | 1 refills | Status: DC

## 2020-10-14 ENCOUNTER — Telehealth: Payer: Self-pay | Admitting: *Deleted

## 2020-10-14 NOTE — Telephone Encounter (Signed)
Ortho bundle 90 day call completed. 

## 2020-10-17 ENCOUNTER — Ambulatory Visit: Payer: Medicare Other | Admitting: Orthopaedic Surgery

## 2020-10-20 ENCOUNTER — Ambulatory Visit (INDEPENDENT_AMBULATORY_CARE_PROVIDER_SITE_OTHER): Payer: Medicare Other | Admitting: Internal Medicine

## 2020-10-20 ENCOUNTER — Other Ambulatory Visit: Payer: Self-pay

## 2020-10-20 ENCOUNTER — Encounter: Payer: Self-pay | Admitting: Internal Medicine

## 2020-10-20 DIAGNOSIS — T8459XD Infection and inflammatory reaction due to other internal joint prosthesis, subsequent encounter: Secondary | ICD-10-CM | POA: Diagnosis not present

## 2020-10-20 DIAGNOSIS — Z96659 Presence of unspecified artificial knee joint: Secondary | ICD-10-CM

## 2020-10-20 MED ORDER — RIFAMPIN 300 MG PO CAPS: 300.0000 mg | ORAL_CAPSULE | Freq: Two times a day (BID) | ORAL | 11 refills | Status: DC

## 2020-10-20 MED ORDER — DOXYCYCLINE HYCLATE 100 MG PO TABS: 100.0000 mg | ORAL_TABLET | Freq: Two times a day (BID) | ORAL | 11 refills | Status: DC

## 2020-10-20 NOTE — Assessment & Plan Note (Signed)
I discussed management options with them again today.  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  I will arrange a phone visit in 6 months.

## 2020-10-20 NOTE — Progress Notes (Signed)
[facility] for [facility]  Patient Active Problem List   Diagnosis Date Noted  . MRSA (methicillin resistant Staphylococcus aureus) infection 06/26/2016    Priority: High  . Chronic infection of prosthetic knee ([place]) 10/12/2015    Priority: High  . Status post total right knee replacement 07/05/2020  . Syncope 04/01/2019  . Closed fracture of multiple pubic rami ([place]) 07/22/2018  . Closed fracture of right superior rim of pubis ([place])   . Lateral dislocation of left patella 01/15/2017  . Unilateral primary osteoarthritis, right knee 01/15/2017  . Osteoporosis 11/26/2016  . Acute pain of left knee 08/27/2016  . Chronic diarrhea 08/07/2016  . Left displaced femoral neck fracture ([place]) 06/26/2016  . Hypertensive heart disease   . Hydronephrosis   . Obstructive uropathy   . Abnormal echocardiogram 01/23/2016  . Pressure ulcer 01/21/2016  . AAA 01/19/2009  . ILIAC ARTERY ANEURYSM 01/19/2009  . Hyperlipidemia 01/14/2009  . TOBACCO ABUSE 01/14/2009  . Essential hypertension 01/14/2009  . Coronary atherosclerosis 01/14/2009  . Cerebrovascular disease 01/14/2009  . Peripheral vascular disease ([place]) 01/14/2009  . CHRONIC OBSTRUCTIVE PULMONARY DISEASE 01/14/2009    Patient's Medications  New Prescriptions   No medications on file  Previous Medications   AMLODIPINE (NORVASC) 10 MG TABLET    Take 1 tablet (10 mg total) by mouth daily.   ASPIRIN EC 81 MG TABLET    Take 1 tablet (81 mg total) by mouth 2 (two) times daily at 8 am and 10 pm.   ATORVASTATIN (LIPITOR) 40 MG TABLET    Take 1 tablet (40 mg total) by mouth daily.   CALCIUM PO    Take 1 tablet by mouth daily.   CARVEDILOL (COREG) 25 MG TABLET    Take 1 tablet (25 mg total) by mouth 2 (two) times daily with a meal.   FUROSEMIDE (LASIX) 20 MG TABLET    Take 1 tablet (20 mg total) by mouth every other day.   HYDRALAZINE (APRESOLINE) 25 MG TABLET    Take 1 tablet (25 mg total) by mouth every 8 (eight)  hours.   LISINOPRIL (ZESTRIL) 40 MG TABLET    Take 1 tablet (40 mg total) by mouth daily.   MAGNESIUM OXIDE 200 MG TABS    Take 200 mg by mouth every morning.    METHOCARBAMOL (ROBAXIN) 500 MG TABLET    Take 1 tablet (500 mg total) by mouth every 6 (six) hours as needed for muscle spasms.   MULTIPLE VITAMIN (MULITIVITAMIN WITH MINERALS) TABS    Take 1 tablet by mouth daily.   NAPROXEN SODIUM (ALEVE) 220 MG TABLET    Take 440 mg by mouth daily as needed (pain).   OXYCODONE (OXY IR/ROXICODONE) 5 MG IMMEDIATE RELEASE TABLET    Take 1 tablet (5 mg total) by mouth every 4 (four) hours as needed for moderate pain (pain score 4-6).   POTASSIUM CHLORIDE SA (KLOR-CON M20) 20 MEQ TABLET    Take 1 tablet (20 mEq total) by mouth daily.   PROBIOTIC PRODUCT (PROBIOTIC DAILY PO)    Take 1 capsule by mouth daily.  Modified Medications   Modified Medication Previous Medication   DOXYCYCLINE (VIBRA-TABS) 100 MG TABLET doxycycline (VIBRA-TABS) 100 MG tablet      Take 1 tablet (100 mg total) by mouth 2 (two) times daily.    Take 1 tablet (100 mg total) by mouth 2 (two) times daily.   RIFAMPIN (RIFADIN) 300 MG CAPSULE rifampin (RIFADIN)  300 MG capsule      Take 1 capsule (300 mg total) by mouth 2 (two) times daily.    TAKE ONE CAPSULE BY MOUTH TWICE A DAY  Discontinued Medications   No medications on file    Subjective: [name] is in with her son for her routine follow-up visit.  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  She has more frequent soft stools over the past few years.  She says she is not sure if that is related to her antibiotics because she takes so many medications.  Her son reminds her that when she takes a probiotic her stools are more formed.    She is not having any abdominal pain, nausea, vomiting or fever.  She bumped her left knee on a windowsill 2 weeks ago which caused an abrasion.  She underwent  successful right total knee arthroplasty last December.  Review of Systems: Review of Systems  Constitutional: Negative for chills, diaphoresis and fever.  Gastrointestinal: Negative for abdominal pain, diarrhea, nausea and vomiting.  Genitourinary: Negative for dysuria.       She has had problems with urinary incontinence over the past month.  Musculoskeletal: Positive for joint pain.    Past Medical History:  Diagnosis Date  . Arthritis   . CAD (coronary artery disease)    a. 02/2007 s/p PCI/DES ot LCX and RCA;  b. 07/2014 MV: no ischemia/infarct, EF 68%.  . Carotid arterial disease ([place])    a. 05/2015 Carotid U/S: RICA 3-57%, LICA 01-77%, RECA 9-39%, LECA >50% - overall stable, f/u 1 yr.  . Complication of anesthesia    Three days after procedure pt statrted to suffer with memoy loss that took about 4-5 days to come back according to pt son [name]."  . Dilated cardiomyopathy ([place])    a. 05/2005 Echo: EF 65%; b. 01/22/2016 Echo: EF 25-30% (in setting of admission for urosepsis w/ elev trop); c. 01/26/2016 Echo: EF 35-40%, inf, distal apical, apical, and mid to distal septal HK-->felt to be 2/2 stress induced CM.  [name] Heart murmur    mild AS 04/2020  . History of bronchitis   . Hyperlipidemia   . Hypertension   . Hypertensive heart disease   . Memory impairment   . MRSA (methicillin resistant staph aureus) culture positive   . Nephrolithiasis    a. 12/2015 UVJ stone w/ hydroureteronephrosis req nephrostomy tube placement.  . Osteoarthritis    a. 0/3009 s/p L TKA complicated by infections req skin grafting.  [name] PVD (peripheral vascular disease) ([place])    a. Bilat common iliac dzs, nl ABI's --> med rx.  . Sepsis ([place])    a. 12/2015 admitted with Proteus mirabilis urosepsis/bacteremia.  . Wears glasses     Social History   Tobacco Use  . Smoking status: Former Smoker    Packs/day: 0.50    Years: 50.00    Pack years: 25.00    Types: Cigarettes    Quit date: 09/28/2015    Years since  quitting: 5.0  . Smokeless tobacco: Never Used  . Tobacco comment: quit 09/27/15  Vaping Use  . Vaping Use: Never used  Substance Use Topics  . Alcohol use: No  . Drug use: No    Family History  Problem Relation Age of Onset  . Breast cancer Mother   . Leukemia Father   . Stroke Sister   . Bone cancer Other  No Known Allergies  Objective: Vitals:   10/20/20 1017  BP: (!) 157/81  Pulse: 62  Temp: (!) 97.3 F (36.3 C)  TempSrc: Oral   There is no height or weight on file to calculate BMI.  Physical Exam Constitutional:      Comments: She is pleasant and in no distress.    Musculoskeletal:     Comments: She has extensive scars and anatomical deformity of her left knee with lateral subluxation of her elbow.she has a superficial abrasion over her patella.  She is seated in a wheelchair.     Problem List Items Addressed This Visit      High   Chronic infection of prosthetic knee ([place])    I discussed management options with them again today.  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  I will arrange a phone visit in 6 months.      Relevant Medications   doxycycline (VIBRA-TABS) 100 MG tablet       Michel Bickers, MD [facility] [place] [phone] pager   [phone] cell 10/20/2020, 10:36 AM

## 2020-10-26 ENCOUNTER — Encounter: Payer: Self-pay | Admitting: Orthopaedic Surgery

## 2020-10-26 ENCOUNTER — Ambulatory Visit (INDEPENDENT_AMBULATORY_CARE_PROVIDER_SITE_OTHER): Payer: Medicare Other | Admitting: Orthopaedic Surgery

## 2020-10-26 DIAGNOSIS — Z96651 Presence of right artificial knee joint: Secondary | ICD-10-CM

## 2020-10-26 NOTE — Progress Notes (Signed)
The patient is a 80 year old female who is now over 3 and half months status post a right total knee arthroplasty.  She says the right knee is doing great.  She has excellent range of motion and strength she states no issues with at all.  Her left knee has chronic issues which cannot have surgery at this point.  That is the knee that had surgery and infection in the past that was done elsewhere.  She is very satisfied with the right knee.  Her right operative knee moves smoothly and fluidly with only some mild swelling.  There is no instability on exam.  At this point we will see her back in 6 months with a repeat AP and lateral of the right operative knee.  Unfortunate there is really nothing we can do for her left knee.  All question concerns were answered and addressed.

## 2020-11-07 ENCOUNTER — Other Ambulatory Visit: Payer: Self-pay | Admitting: Cardiology

## 2020-11-14 ENCOUNTER — Other Ambulatory Visit: Payer: Self-pay | Admitting: Cardiology

## 2020-11-14 NOTE — Telephone Encounter (Signed)
Rx has been sent to the pharmacy electronically. ° °

## 2021-01-24 ENCOUNTER — Other Ambulatory Visit: Payer: Self-pay | Admitting: Family Medicine

## 2021-01-24 DIAGNOSIS — I679 Cerebrovascular disease, unspecified: Secondary | ICD-10-CM

## 2021-01-24 DIAGNOSIS — I2510 Atherosclerotic heart disease of native coronary artery without angina pectoris: Secondary | ICD-10-CM

## 2021-01-26 ENCOUNTER — Other Ambulatory Visit: Payer: Self-pay | Admitting: Orthopaedic Surgery

## 2021-01-26 ENCOUNTER — Other Ambulatory Visit: Payer: Self-pay | Admitting: Cardiology

## 2021-01-26 DIAGNOSIS — G8929 Other chronic pain: Secondary | ICD-10-CM

## 2021-01-26 NOTE — Telephone Encounter (Signed)
Rx(s) sent to pharmacy electronically.  

## 2021-02-07 ENCOUNTER — Other Ambulatory Visit: Payer: Self-pay | Admitting: Cardiology

## 2021-02-22 ENCOUNTER — Other Ambulatory Visit: Payer: Self-pay | Admitting: Family Medicine

## 2021-02-22 DIAGNOSIS — I2510 Atherosclerotic heart disease of native coronary artery without angina pectoris: Secondary | ICD-10-CM

## 2021-02-22 DIAGNOSIS — I679 Cerebrovascular disease, unspecified: Secondary | ICD-10-CM

## 2021-04-25 ENCOUNTER — Other Ambulatory Visit: Payer: Self-pay | Admitting: Cardiology

## 2021-04-27 ENCOUNTER — Ambulatory Visit (INDEPENDENT_AMBULATORY_CARE_PROVIDER_SITE_OTHER): Payer: Medicare Other | Admitting: Orthopaedic Surgery

## 2021-04-27 ENCOUNTER — Encounter: Payer: Self-pay | Admitting: Orthopaedic Surgery

## 2021-04-27 DIAGNOSIS — Z96651 Presence of right artificial knee joint: Secondary | ICD-10-CM | POA: Diagnosis not present

## 2021-04-27 DIAGNOSIS — I2510 Atherosclerotic heart disease of native coronary artery without angina pectoris: Secondary | ICD-10-CM

## 2021-04-27 NOTE — Progress Notes (Signed)
The patient is a 80 year old female who is now 10 months status post a right total knee arthroplasty which is her primary knee replacement done in December of last year.  She is very satisfied with her right knee and has no issues with that at all.  Her biggest concern though is the instability of her left knee.  She has a remote history of a left knee arthroplasty done by someone else on that knee.  She had a postoperative infection and required multiple debridements.  Eventually she had to have some type of flap coverage and the patella is chronically subluxed.  She wears a knee brace chronically for that knee.  The knee that we replaced has full range of motion and is ligamentously stable.  There is no effusion and no swelling and the incisions healed nicely.  As far as her right knee goes, follow-up can be as needed.  She would like to see if anyone at [facility] or [facility] with see her from the potential of considering a revision arthroplasty of the left knee which today would be quite difficult given the fact that she has had a flap and skin graft.  She would need to be seen at an institution that has plastic surgery available and other options.  We will work on making that referral to a total joint revision specialist.

## 2021-04-28 ENCOUNTER — Other Ambulatory Visit: Payer: Self-pay

## 2021-04-28 DIAGNOSIS — Z96652 Presence of left artificial knee joint: Secondary | ICD-10-CM

## 2021-05-08 ENCOUNTER — Telehealth: Payer: Medicare Other | Admitting: Internal Medicine

## 2021-05-09 ENCOUNTER — Telehealth: Payer: Medicare Other | Admitting: Internal Medicine

## 2021-05-25 ENCOUNTER — Other Ambulatory Visit: Payer: Self-pay | Admitting: Cardiology

## 2021-05-30 ENCOUNTER — Ambulatory Visit (INDEPENDENT_AMBULATORY_CARE_PROVIDER_SITE_OTHER): Payer: Medicare Other | Admitting: Internal Medicine

## 2021-05-30 ENCOUNTER — Encounter: Payer: Self-pay | Admitting: Internal Medicine

## 2021-05-30 ENCOUNTER — Other Ambulatory Visit: Payer: Self-pay

## 2021-05-30 DIAGNOSIS — I2510 Atherosclerotic heart disease of native coronary artery without angina pectoris: Secondary | ICD-10-CM

## 2021-05-30 DIAGNOSIS — Z96659 Presence of unspecified artificial knee joint: Secondary | ICD-10-CM | POA: Diagnosis not present

## 2021-05-30 DIAGNOSIS — T8459XD Infection and inflammatory reaction due to other internal joint prosthesis, subsequent encounter: Secondary | ICD-10-CM | POA: Diagnosis not present

## 2021-05-30 NOTE — Progress Notes (Signed)
[facility]  Patient Active Problem List   Diagnosis Date Noted   MRSA (methicillin resistant Staphylococcus aureus) infection 06/26/2016    Priority: High   Chronic infection of prosthetic knee ([place]) 10/12/2015    Priority: High   Status post total right knee replacement 07/05/2020   Syncope 04/01/2019   Closed fracture of multiple pubic rami ([place]) 07/22/2018   Closed fracture of right superior rim of pubis ([place])    Lateral dislocation of left patella 01/15/2017   Unilateral primary osteoarthritis, right knee 01/15/2017   Osteoporosis 11/26/2016   Acute pain of left knee 08/27/2016   Chronic diarrhea 08/07/2016   Left displaced femoral neck fracture ([place]) 06/26/2016   Hypertensive heart disease    Hydronephrosis    Obstructive uropathy    Abnormal echocardiogram 01/23/2016   Pressure ulcer 01/21/2016   AAA 01/19/2009   ILIAC ARTERY ANEURYSM 01/19/2009   Hyperlipidemia 01/14/2009   TOBACCO ABUSE 01/14/2009   Essential hypertension 01/14/2009   Coronary atherosclerosis 01/14/2009   Cerebrovascular disease 01/14/2009   Peripheral vascular disease ([place]) 01/14/2009   CHRONIC OBSTRUCTIVE PULMONARY DISEASE 01/14/2009    Patient's Medications  New Prescriptions   No medications on file  Previous Medications   AMLODIPINE (NORVASC) 10 MG TABLET    Take 1 tablet (10 mg total) by mouth daily. SCHEDULE OFFICE VISIT FOR FUTURE REFILLS   ASPIRIN EC 81 MG TABLET    Take 1 tablet (81 mg total) by mouth 2 (two) times daily at 8 am and 10 pm.   ATORVASTATIN (LIPITOR) 40 MG TABLET    Take 1 tablet (40 mg total) by mouth daily. Pt needs office visit for further refills   CALCIUM PO    Take 1 tablet by mouth daily.   CARVEDILOL (COREG) 25 MG TABLET    Take 1 tablet (25 mg total) by mouth 2 (two) times daily with a meal.   DOXYCYCLINE (VIBRA-TABS) 100 MG TABLET    Take 1 tablet (100 mg total) by mouth 2 (two) times daily.   FUROSEMIDE (LASIX) 20 MG TABLET     TAKE ONE TABLET BY MOUTH EVERY OTHER DAY   HYDRALAZINE (APRESOLINE) 25 MG TABLET    TAKE ONE TABLET BY MOUTH EVERY 8 HOURS   KLOR-CON M20 20 MEQ TABLET    TAKE ONE TABLET BY MOUTH DAILY   LISINOPRIL (ZESTRIL) 40 MG TABLET    Take 1 tablet (40 mg total) by mouth daily.   MAGNESIUM OXIDE 200 MG TABS    Take 200 mg by mouth every morning.    METHOCARBAMOL (ROBAXIN) 500 MG TABLET    Take 1 tablet (500 mg total) by mouth every 6 (six) hours as needed for muscle spasms.   MULTIPLE VITAMIN (MULITIVITAMIN WITH MINERALS) TABS    Take 1 tablet by mouth daily.   NAPROXEN SODIUM (ALEVE) 220 MG TABLET    Take 440 mg by mouth daily as needed (pain).   OXYCODONE (OXY IR/ROXICODONE) 5 MG IMMEDIATE RELEASE TABLET    Take 1 tablet (5 mg total) by mouth every 4 (four) hours as needed for moderate pain (pain score 4-6).   PROBIOTIC PRODUCT (PROBIOTIC DAILY PO)    Take 1 capsule by mouth daily.   RIFAMPIN (RIFADIN) 300 MG CAPSULE    Take 1 capsule (300 mg total) by mouth 2 (two) times daily.   TRAMADOL (ULTRAM) 50 MG TABLET    Take 1-2 tablets (50-100 mg total) by mouth every 12 (  twelve) hours as needed for moderate pain.  Modified Medications   No medications on file  Discontinued Medications   No medications on file    Subjective: [name] is in for her routine follow-up visit.  She is accompanied by her son, [name]. She underwent left total knee arthroplasty on 09/28/2015. Very soon after her surgery she developed signs of acute infection and underwent incision and drainage with poly-exchange on 10/12/2015. Operative cultures grew MRSA. She was discharged before culture results were available on vancomycin and levofloxacin. She underwent a second incision and drainage procedure for wound dehiscence on 10/18/2015. She required a lateral gastrocnemius muscle flap repair on 11/16/2015. She followed up with my partner and was switched to oral doxycycline with the intention of continuing that for up to 6 months  total. She was in and out of several skilled nursing facilities at this time. She had 2 more procedures done on her knee in May and then again in June for skin grafting. She was readmitted to the hospital on 01/20/2016 with sepsis and Proteus bacteremia from a urinary source. Her admission notes indicates that she was on doxycycline at that time. However doxycycline was stopped during that hospitalization and it was not continued upon discharge. She missed her follow-up visit here in clinic. In early October of 2017 she developed sudden onset of severe pain and redness in her left knee. She was seen back in the office by Dr. Sharol Given who noted a small area of cellulitis approximate 2 cm in diameter over the patellar tendon. He incised and drained a small abscess which grew MRSA again. A PICC line was placed and she was started back on IV vancomycin before switching to chronic, suppressive oral doxycycline.  In June 2018 she developed a draining sinus tract over her left patella.  I added oral rifampin to doxycycline and the drainage stopped over the following month.  She has remained on both antibiotics since that time.  She has had an increased and soft stools over the past few years but no watery diarrhea.  She does not like the fact that she has to take chronic antibiotics but she feels like she is tolerating them well.  She recently underwent right total knee arthroplasty and has not had a very good and uneventful recovery.  She has been a little more ambulatory since that time.  She met with her orthopedic surgeon, Dr. Zollie Beckers and, on 04/27/2021.  He has referred her to [facility] to be evaluated for possible revision arthroplasty of her left knee.  Review of Systems: Review of Systems  Constitutional:  Negative for fever.  Gastrointestinal:  Negative for abdominal pain, diarrhea, nausea and vomiting.  Musculoskeletal:  Positive for joint pain.   Past Medical History:  Diagnosis  Date   Arthritis    CAD (coronary artery disease)    a. 02/2007 s/p PCI/DES ot LCX and RCA;  b. 07/2014 MV: no ischemia/infarct, EF 68%.   Carotid arterial disease ([place])    a. 05/2015 Carotid U/S: RICA 6-71%, LICA 24-58%, RECA 0-99%, LECA >50% - overall stable, f/u 1 yr.   Complication of anesthesia    Three days after procedure pt statrted to suffer with memoy loss that took about 4-5 days to come back according to [name]."   Dilated cardiomyopathy ([place])    a. 05/2005 Echo: EF 65%; b. 01/22/2016 Echo: EF 25-30% (in setting of admission for urosepsis w/ elev trop); c. 01/26/2016 Echo: EF 35-40%, inf,  distal apical, apical, and mid to distal septal HK-->felt to be 2/2 stress induced CM.   Heart murmur    mild AS 04/2020   History of bronchitis    Hyperlipidemia    Hypertension    Hypertensive heart disease    Memory impairment    MRSA (methicillin resistant staph aureus) culture positive    Nephrolithiasis    a. 12/2015 UVJ stone w/ hydroureteronephrosis req nephrostomy tube placement.   Osteoarthritis    a. 02/164 s/p L TKA complicated by infections req skin grafting.   PVD (peripheral vascular disease) ([place])    a. Bilat common iliac dzs, nl ABI's --> med rx.   Sepsis ([place])    a. 12/2015 admitted with Proteus mirabilis urosepsis/bacteremia.   Wears glasses     Social History   Tobacco Use   Smoking status: Former    Packs/day: 0.50    Years: 50.00    Pack years: 25.00    Types: Cigarettes    Quit date: 09/28/2015    Years since quitting: 5.6   Smokeless tobacco: Never   Tobacco comments:    quit 09/27/15  Vaping Use   Vaping Use: Never used  Substance Use Topics   Alcohol use: No   Drug use: No    Family History  Problem Relation Age of Onset   Breast cancer Mother    Leukemia Father    Stroke Sister    Bone cancer Other     No Known Allergies  Objective: Vitals:   05/30/21 1433  BP: (!) 177/74  Pulse: 66  Temp: 97.9 F (36.6 C)  TempSrc: Temporal  SpO2:  96%   There is no height or weight on file to calculate BMI.  Physical Exam Constitutional:      Comments: She is very pleasant and matter-of-fact as usual.  She is seated in a wheelchair.  Cardiovascular:     Rate and Rhythm: Normal rate.  Pulmonary:     Effort: Pulmonary effort is normal.  Musculoskeletal:     Comments: She has some bony deformity over her left patella that is unchanged.  Skin is very thin.  There is no unusual erythema, fluctuance or drainage.  Psychiatric:        Mood and Affect: Mood normal.    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  She has some pill fatigue but otherwise has tolerated her antibiotics reasonably well.  I have told them again that I do not think that her infection could be curative without removal of the old prosthesis and cement.  She is scheduled to be evaluated for possible revision arthroplasty at [facility] in the next 2 weeks.  If the orthopedic surgeon there feels that revision arthroplasty is indicated then there is a chance that her infection could be cured.  She will follow-up a week or so after she is evaluated at [facility].        Michel Bickers, MD [facility] for [facility] [phone] pager   [phone] cell 05/30/2021, 3:44 PM

## 2021-05-30 NOTE — Assessment & Plan Note (Signed)
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  She has some pill fatigue but otherwise has tolerated her antibiotics reasonably well.  I have told them again that I do not think that her infection could be curative without removal of the old prosthesis and cement.  She is scheduled to be evaluated for possible revision arthroplasty at [facility] in the next 2 weeks.  If the orthopedic surgeon there feels that revision arthroplasty is indicated then there is a chance that her infection could be cured.  She will follow-up a week or so after she is evaluated at [facility].

## 2021-06-06 NOTE — Progress Notes (Signed)
[place] at [facility] [address] [place], [place] [address] [phone]  Date:  06/12/2021   Name:  [name]   DOB:  October 19, 1940   MRN:  [id number]  PCP:  Darreld Mclean, MD    Chief Complaint: Annual Exam (Concerns/ questions: zoster/Flu shot today: yes/)   History of Present Illness:  [name] is a 80 y.o. very pleasant female patient who presents with the following:  Pt seen today for a periodic recheck/ medicare CPE Last visit with myself was about one year ago  Today [name] notes she is overall doing quite well.  Her main problem is her chronically infected left knee  She has history of CAD status post stents x3, aortic stenosis, hyperlipidemia, hypertension, peripheral vascular disease, COPD.  In 2017 she had a left total knee, complicated by MRSA infection.  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  She has some pill fatigue but otherwise has tolerated her antibiotics reasonably well.  I have told them again that I do not think that her infection could be curative without removal of the old prosthesis and cement.  She is scheduled to be evaluated for possible revision arthroplasty at [facility] in the next 2 weeks.  If the orthopedic surgeon there feels that revision arthroplasty is indicated then there is a chance that her  infection could be cured.  She will follow-up a week or so after she is evaluated at [facility].       Shingrix-recommended Pneumonia booster-give today Covid booster- not done yet, recommended Flu shot - give today Can update labs today Mammo -she would like to have this done, ordered for her today  Her hands bother her as well She is living on her own, her children keep an eye on her  She has not driven in about 4 years-since issue with her knee.  She expresses some interest in driving again but her son who is with her does not think this is a great idea as she is out of practice  She has an appt with [facility] next month for cardiology follow-up Patient Active Problem List   Diagnosis Date Noted   Status post total right knee replacement 07/05/2020   Syncope 04/01/2019   Closed fracture of multiple pubic rami ([place]) 07/22/2018   Closed fracture of right superior rim of pubis ([place])    Lateral dislocation of left patella 01/15/2017   Unilateral primary osteoarthritis, right knee 01/15/2017   Osteoporosis 11/26/2016   Acute pain of left knee 08/27/2016   Chronic diarrhea 08/07/2016   Left displaced femoral neck fracture ([place]) 06/26/2016   MRSA (methicillin resistant Staphylococcus aureus) infection 06/26/2016   Hypertensive heart disease    Hydronephrosis    Obstructive uropathy    Abnormal echocardiogram 01/23/2016   Pressure ulcer 01/21/2016   Chronic infection of prosthetic knee ([place])  10/12/2015   AAA 01/19/2009   ILIAC ARTERY ANEURYSM 01/19/2009   Hyperlipidemia 01/14/2009   TOBACCO ABUSE 01/14/2009   Essential hypertension 01/14/2009   Coronary atherosclerosis 01/14/2009   Cerebrovascular disease 01/14/2009   Peripheral vascular disease ([place]) 01/14/2009   CHRONIC OBSTRUCTIVE PULMONARY DISEASE 01/14/2009    Past Medical History:  Diagnosis Date   Arthritis    CAD (coronary artery disease)    a. 02/2007 s/p PCI/DES ot LCX and RCA;  b. 07/2014 MV: no ischemia/infarct, EF  68%.   Carotid arterial disease ([place])    a. 05/2015 Carotid U/S: RICA 4-27%, LICA 06-23%, RECA 7-62%, LECA >50% - overall stable, f/u 1 yr.   Complication of anesthesia    Three days after procedure pt statrted to suffer with memoy loss that took about 4-5 days to come back according to [name]."   Dilated cardiomyopathy ([place])    a. 05/2005 Echo: EF 65%; b. 01/22/2016 Echo: EF 25-30% (in setting of admission for urosepsis w/ elev trop); c. 01/26/2016 Echo: EF 35-40%, inf, distal apical, apical, and mid to distal septal HK-->felt to be 2/2 stress induced CM.   Heart murmur    mild AS 04/2020   History of bronchitis    Hyperlipidemia    Hypertension    Hypertensive heart disease    Memory impairment    MRSA (methicillin resistant staph aureus) culture positive    Nephrolithiasis    a. 12/2015 UVJ stone w/ hydroureteronephrosis req nephrostomy tube placement.   Osteoarthritis    a. 02/3150 s/p L TKA complicated by infections req skin grafting.   PVD (peripheral vascular disease) ([place])    a. Bilat common iliac dzs, nl ABI's --> med rx.   Sepsis ([place])    a. 12/2015 admitted with Proteus mirabilis urosepsis/bacteremia.   Wears glasses     Past Surgical History:  Procedure Laterality Date   ABDOMINAL HYSTERECTOMY     APPLICATION OF A-CELL OF EXTREMITY Left 12/08/2015   Procedure: APPLICATION OF A-CELL OF knee;  Surgeon: Loel Lofty [name], DO;  Location: [place];  Service: Plastics;  Laterality: Left;   APPLICATION OF WOUND VAC Left 11/16/2015   Procedure: APPLICATION OF WOUND VAC;  Surgeon: Loel Lofty [name], DO;  Location: [place];  Service: Plastics;  Laterality: Left;   APPLICATION OF WOUND VAC Left 12/08/2015   Procedure: APPLICATION OF WOUND VAC  AND CAST to knee;  Surgeon: Loel Lofty [name], DO;  Location: [place];  Service: Plastics;  Laterality: Left;   COLONOSCOPY     CORONARY ANGIOPLASTY  07   3 stents placed   ESOPHAGOGASTRODUODENOSCOPY     EYE SURGERY Bilateral    cataracts    FREE FLAP TO EXTREMITY Left 11/16/2015   Procedure: FREE FLAP TO EXTREMITY;  Surgeon: Loel Lofty [name], DO;  Location: [place];  Service: Plastics;  Laterality: Left;   I & D EXTREMITY Left 10/18/2015   Procedure: Left Knee Washout, Reclosure;  Surgeon: Newt Minion, MD;  Location: [place];  Service: Orthopedics;  Laterality: Left;   I & D EXTREMITY Left 12/08/2015   Procedure: IRRIGATION AND DEBRIDEMENT knee;  Surgeon: Loel Lofty [name], DO;  Location: [place];  Service: Plastics;  Laterality: Left;   I & D KNEE WITH POLY EXCHANGE Left 10/12/2015   Procedure: Irrigation and Debridement , Poly Exchange, Antibiotic Beads Left Knee;  Surgeon: Newt Minion, MD;  Location: [place];  Service: Orthopedics;  Laterality: Left;   INCISION AND DRAINAGE OF WOUND Left  01/04/2016   Procedure: DEBRIDEMENT OF LEFT KNEE WOUND WITH ACELL AND WOUND VAC PLACEMENT;  Surgeon: Loel Lofty [name], DO;  Location: [place];  Service: Plastics;  Laterality: Left;   IR GENERIC HISTORICAL  05/11/2016   IR FLUORO GUIDE CV MIDLINE PICC RIGHT 05/11/2016 Sandi Mariscal, MD [facility]   IR GENERIC HISTORICAL  05/11/2016   IR US GUIDE VASC ACCESS RIGHT 05/11/2016 Sandi Mariscal, MD [facility]   KNEE CLOSED REDUCTION Left 01/16/2017   Procedure: CLOSED MANIPULATION LEFT KNEE;  Surgeon: Leandrew Koyanagi, MD;  Location: [place];  Service: Orthopedics;  Laterality: Left;   KNEE SURGERY     denies   SKIN SPLIT GRAFT Left 11/16/2015   Procedure: LEFT GASTROC MUSCLE FLAP WITH SKIN GRAFT SPLIT THICKNESS AND VAC PLACEMENT;  Surgeon: Loel Lofty [name], DO;  Location: [place];  Service: Plastics;  Laterality: Left;   TOTAL HIP ARTHROPLASTY Right 09/10/2012   Dr Sharol Given   TOTAL HIP ARTHROPLASTY Right 09/10/2012   Procedure: TOTAL HIP ARTHROPLASTY;  Surgeon: Newt Minion, MD;  Location: [place];  Service: Orthopedics;  Laterality: Right;  Right Total Hip Arthroplasty   TOTAL HIP ARTHROPLASTY Left 06/27/2016   Procedure: LEFT TOTAL HIP  ARTHROPLASTY ANTERIOR APPROACH;  Surgeon: Leandrew Koyanagi, MD;  Location: [place];  Service: Orthopedics;  Laterality: Left;   TOTAL KNEE ARTHROPLASTY Left 09/28/2015   Procedure: TOTAL KNEE ARTHROPLASTY;  Surgeon: Newt Minion, MD;  Location: [place];  Service: Orthopedics;  Laterality: Left;   TOTAL KNEE ARTHROPLASTY Right 07/05/2020   TOTAL KNEE ARTHROPLASTY Right 07/05/2020   Procedure: RIGHT TOTAL KNEE ARTHROPLASTY;  Surgeon: Mcarthur Rossetti, MD;  Location: [place];  Service: Orthopedics;  Laterality: Right;    Social History   Tobacco Use   Smoking status: Former    Packs/day: 0.50    Years: 50.00    Pack years: 25.00    Types: Cigarettes    Quit date: 09/28/2015    Years since quitting: 5.7   Smokeless tobacco: Never   Tobacco comments:    quit 09/27/15  Vaping Use   Vaping Use: Never used  Substance Use Topics   Alcohol use: No   Drug use: No    Family History  Problem Relation Age of Onset   Breast cancer Mother    Leukemia Father    Stroke Sister    Bone cancer Other     No Known Allergies  Medication list has been reviewed and updated.  Current Outpatient Medications on File Prior to Visit  Medication Sig Dispense Refill   amLODipine (NORVASC) 10 MG tablet TAKE ONE TABLET BY MOUTH DAILY - NEED OFFICE VISIT 90 tablet 0   aspirin EC 81 MG tablet Take 1 tablet (81 mg total) by mouth 2 (two) times daily at 8 am and 10 pm. 30 tablet 0   atorvastatin (LIPITOR) 40 MG tablet Take 1 tablet (40 mg total) by mouth daily. Pt needs office visit for further refills 30 tablet 0   CALCIUM PO Take 1 tablet by mouth daily.     carvedilol (COREG) 25 MG tablet Take 1 tablet (25 mg total) by mouth 2 (two) times daily with a meal. 180 tablet 3   doxycycline (VIBRA-TABS) 100 MG tablet Take 1 tablet (100 mg total) by mouth 2 (two) times daily. 60 tablet 11   furosemide (LASIX) 20 MG tablet TAKE ONE TABLET BY MOUTH EVERY OTHER DAY 45 tablet 3   hydrALAZINE (APRESOLINE) 25 MG  tablet TAKE  ONE TABLET BY MOUTH EVERY 8 HOURS 270 tablet 1   KLOR-CON M20 20 MEQ tablet TAKE ONE TABLET BY MOUTH DAILY 90 tablet 3   lisinopril (ZESTRIL) 40 MG tablet Take 1 tablet (40 mg total) by mouth daily. 90 tablet 3   Magnesium Oxide 200 MG TABS Take 200 mg by mouth every morning.      methocarbamol (ROBAXIN) 500 MG tablet Take 1 tablet (500 mg total) by mouth every 6 (six) hours as needed for muscle spasms. 20 tablet 0   Multiple Vitamin (MULITIVITAMIN WITH MINERALS) TABS Take 1 tablet by mouth daily.     naproxen sodium (ALEVE) 220 MG tablet Take 440 mg by mouth daily as needed (pain).     oxyCODONE (OXY IR/ROXICODONE) 5 MG immediate release tablet Take 1 tablet (5 mg total) by mouth every 4 (four) hours as needed for moderate pain (pain score 4-6). 30 tablet 0   Probiotic Product (PROBIOTIC DAILY PO) Take 1 capsule by mouth daily.     rifampin (RIFADIN) 300 MG capsule Take 1 capsule (300 mg total) by mouth 2 (two) times daily. 60 capsule 11   traMADol (ULTRAM) 50 MG tablet Take 1-2 tablets (50-100 mg total) by mouth every 12 (twelve) hours as needed for moderate pain. 30 tablet 0   No current facility-administered medications on file prior to visit.    Review of Systems:  As per HPI- otherwise negative.   Physical Examination: Vitals:   06/12/21 1307  BP: 122/74  Pulse: 70  Resp: 18  SpO2: 98%   Vitals:   06/12/21 1307  Weight: 114 lb 12.8 oz (52.1 kg)   Body mass index is 19.71 kg/m. Ideal Body Weight:    Wt Readings from Last 3 Encounters:  06/12/21 114 lb 12.8 oz (52.1 kg)  07/05/20 113 lb 8.6 oz (51.5 kg)  06/29/20 113 lb 9.6 oz (51.5 kg)    GEN: no acute distress.  Looks well, normal weight.  Seated in wheelchair HEENT: Atraumatic, Normocephalic.  Ears and Nose: No external deformity. CV: RRR, No M/G/R. No JVD. No thrill. No extra heart sounds. PULM: CTA B, no wheezes, crackles, rhonchi. No retractions. No resp. distress. No accessory muscle use. ABD: S, NT, ND. No  rebound. No HSM. EXTR: No c/c/e PSYCH: Normally interactive. Conversant.    Assessment and Plan: Essential hypertension - Plan: CBC, Comprehensive metabolic panel  Cerebrovascular disease  Peripheral vascular disease (HCC)  Atherosclerosis of native coronary artery of native heart without angina pectoris  Pure hypercholesterolemia - Plan: Lipid panel  MRSA (methicillin resistant Staphylococcus aureus) infection  Encounter for screening mammogram for malignant neoplasm of breast - Plan: MM 3D SCREEN BREAST BILATERAL  Fatigue, unspecified type - Plan: TSH  Elevated glucose - Plan: Hemoglobin A1c  Immunization due - Plan: Pneumococcal conjugate vaccine 20-valent (Prevnar 20)  Following up today.  Blood pressures under good control Labs are pending as above Ordered mammogram, completed flu and pneumonia vaccines today Recommend Shingrix and COVID booster Will plan further follow- up pending labs.   Signed Lamar Blinks, MD Received her labs as below, message to patient Results for orders placed or performed in visit on 06/12/21  CBC  Result Value Ref Range   WBC 4.0 4.0 - 10.5 K/uL   RBC 3.53 (L) 3.87 - 5.11 Mil/uL   Platelets 209.0 150.0 - 400.0 K/uL   Hemoglobin 12.8 12.0 - 15.0 g/dL   HCT 35.4 (L) 36.0 - 46.0 %   MCV 100.3 (H)  78.0 - 100.0 fl   MCHC 36.1 (H) 30.0 - 36.0 g/dL   RDW 13.3 11.5 - 15.5 %  Comprehensive metabolic panel  Result Value Ref Range   Sodium 139 135 - 145 mEq/L   Potassium 4.7 3.5 - 5.1 mEq/L   Chloride 104 96 - 112 mEq/L   CO2 28 19 - 32 mEq/L   Glucose, Bld 111 (H) 70 - 99 mg/dL   BUN 23 6 - 23 mg/dL   Creatinine, Ser 0.80 0.40 - 1.20 mg/dL   Total Bilirubin 0.3 0.2 - 1.2 mg/dL   Alkaline Phosphatase 97 39 - 117 U/L   AST 25 0 - 37 U/L   ALT 19 0 - 35 U/L   Total Protein 6.1 6.0 - 8.3 g/dL   Albumin 3.5 3.5 - 5.2 g/dL   GFR 69.77 >60.00 mL/min   Calcium 9.3 8.4 - 10.5 mg/dL  Hemoglobin A1c  Result Value Ref Range   Hgb A1c MFr  Bld 6.4 4.6 - 6.5 %  Lipid panel  Result Value Ref Range   Cholesterol 201 (H) 0 - 200 mg/dL   Triglycerides 124.0 0.0 - 149.0 mg/dL   HDL 62.70 >39.00 mg/dL   VLDL 24.8 0.0 - 40.0 mg/dL   LDL Cholesterol 113 (H) 0 - 99 mg/dL   Total CHOL/HDL Ratio 3    NonHDL 137.91   TSH  Result Value Ref Range   TSH 3.40 0.35 - 5.50 uIU/mL

## 2021-06-06 NOTE — Patient Instructions (Addendum)
Good to see you again today!  You might try some voltaren gel for your hand pain / arthritis Flu and pneumonia vaccines today I would suggest getting the latest covid booster and the shingles vaccine series at your convenience  Mammogram ordered to be done at the [facility] in [place]

## 2021-06-09 ENCOUNTER — Other Ambulatory Visit: Payer: Self-pay | Admitting: Cardiology

## 2021-06-09 DIAGNOSIS — M25562 Pain in left knee: Secondary | ICD-10-CM | POA: Diagnosis not present

## 2021-06-09 DIAGNOSIS — Z96652 Presence of left artificial knee joint: Secondary | ICD-10-CM | POA: Diagnosis not present

## 2021-06-12 ENCOUNTER — Other Ambulatory Visit: Payer: Self-pay

## 2021-06-12 ENCOUNTER — Encounter: Payer: Self-pay | Admitting: Family Medicine

## 2021-06-12 ENCOUNTER — Ambulatory Visit (INDEPENDENT_AMBULATORY_CARE_PROVIDER_SITE_OTHER): Payer: Medicare Other | Admitting: Family Medicine

## 2021-06-12 ENCOUNTER — Ambulatory Visit: Payer: Medicare Other | Attending: Internal Medicine

## 2021-06-12 VITALS — BP 122/74 | HR 70 | Resp 18 | Wt 114.8 lb

## 2021-06-12 DIAGNOSIS — I679 Cerebrovascular disease, unspecified: Secondary | ICD-10-CM | POA: Diagnosis not present

## 2021-06-12 DIAGNOSIS — R5383 Other fatigue: Secondary | ICD-10-CM

## 2021-06-12 DIAGNOSIS — Z23 Encounter for immunization: Secondary | ICD-10-CM

## 2021-06-12 DIAGNOSIS — Z1231 Encounter for screening mammogram for malignant neoplasm of breast: Secondary | ICD-10-CM | POA: Diagnosis not present

## 2021-06-12 DIAGNOSIS — R7303 Prediabetes: Secondary | ICD-10-CM

## 2021-06-12 DIAGNOSIS — I2510 Atherosclerotic heart disease of native coronary artery without angina pectoris: Secondary | ICD-10-CM | POA: Diagnosis not present

## 2021-06-12 DIAGNOSIS — E7800 Pure hypercholesterolemia, unspecified: Secondary | ICD-10-CM

## 2021-06-12 DIAGNOSIS — R7309 Other abnormal glucose: Secondary | ICD-10-CM

## 2021-06-12 DIAGNOSIS — I10 Essential (primary) hypertension: Secondary | ICD-10-CM | POA: Diagnosis not present

## 2021-06-12 DIAGNOSIS — I739 Peripheral vascular disease, unspecified: Secondary | ICD-10-CM | POA: Diagnosis not present

## 2021-06-12 DIAGNOSIS — A4902 Methicillin resistant Staphylococcus aureus infection, unspecified site: Secondary | ICD-10-CM

## 2021-06-12 LAB — COMPREHENSIVE METABOLIC PANEL
ALT: 19 U/L (ref 0–35)
AST: 25 U/L (ref 0–37)
Albumin: 3.5 g/dL (ref 3.5–5.2)
Alkaline Phosphatase: 97 U/L (ref 39–117)
BUN: 23 mg/dL (ref 6–23)
CO2: 28 mEq/L (ref 19–32)
Calcium: 9.3 mg/dL (ref 8.4–10.5)
Chloride: 104 mEq/L (ref 96–112)
Creatinine, Ser: 0.8 mg/dL (ref 0.40–1.20)
GFR: 69.77 mL/min (ref >60.00)
Glucose, Bld: 111 mg/dL — ABNORMAL HIGH (ref 70–99)
Potassium: 4.7 mEq/L (ref 3.5–5.1)
Sodium: 139 mEq/L (ref 135–145)
Total Bilirubin: 0.3 mg/dL (ref 0.2–1.2)
Total Protein: 6.1 g/dL (ref 6.0–8.3)

## 2021-06-12 LAB — TSH: TSH: 3.4 u[IU]/mL (ref 0.35–5.50)

## 2021-06-12 LAB — CBC
HCT: 35.4 % — ABNORMAL LOW (ref 36.0–46.0)
Hemoglobin: 12.8 g/dL (ref 12.0–15.0)
MCHC: 36.1 g/dL — ABNORMAL HIGH (ref 30.0–36.0)
MCV: 100.3 fl — ABNORMAL HIGH (ref 78.0–100.0)
Platelets: 209 10*3/uL (ref 150.0–400.0)
RBC: 3.53 Mil/uL — ABNORMAL LOW (ref 3.87–5.11)
RDW: 13.3 % (ref 11.5–15.5)
WBC: 4 10*3/uL (ref 4.0–10.5)

## 2021-06-12 LAB — LIPID PANEL
Cholesterol: 201 mg/dL — ABNORMAL HIGH (ref 0–200)
HDL: 62.7 mg/dL (ref >39.00)
LDL Cholesterol: 113 mg/dL — ABNORMAL HIGH (ref 0–99)
NonHDL: 137.91
Total CHOL/HDL Ratio: 3
Triglycerides: 124 mg/dL (ref 0.0–149.0)
VLDL: 24.8 mg/dL (ref 0.0–40.0)

## 2021-06-12 LAB — HEMOGLOBIN A1C: Hgb A1c MFr Bld: 6.4 % (ref 4.6–6.5)

## 2021-06-12 NOTE — Progress Notes (Signed)
   Covid-19 Vaccination Clinic  Name:  [name]    MRN: [id number] DOB: 1941/06/17  06/12/2021  [name] was observed post Covid-19 immunization for 15 minutes without incident. She was provided with Vaccine Information Sheet and instruction to access the V-Safe system.   [name] was instructed to call 911 with any severe reactions post vaccine: Difficulty breathing  Swelling of face and throat  A fast heartbeat  A bad rash all over body  Dizziness and weakness   Immunizations Administered     Name Date Dose VIS Date Route   Pfizer Covid-19 Vaccine Bivalent Booster 06/12/2021  2:03 PM 0.3 mL 03/29/2021 Intramuscular   Manufacturer: [place]   Lot: [id number]   [place]: [phone]

## 2021-06-15 ENCOUNTER — Ambulatory Visit: Payer: Medicare Other | Admitting: Internal Medicine

## 2021-06-15 ENCOUNTER — Telehealth: Payer: Medicare Other | Admitting: Internal Medicine

## 2021-06-20 DIAGNOSIS — S83015A Lateral dislocation of left patella, initial encounter: Secondary | ICD-10-CM | POA: Diagnosis not present

## 2021-06-20 DIAGNOSIS — Z96652 Presence of left artificial knee joint: Secondary | ICD-10-CM | POA: Diagnosis not present

## 2021-06-20 DIAGNOSIS — X58XXXA Exposure to other specified factors, initial encounter: Secondary | ICD-10-CM | POA: Diagnosis not present

## 2021-06-28 ENCOUNTER — Other Ambulatory Visit: Payer: Self-pay | Admitting: Family Medicine

## 2021-06-28 DIAGNOSIS — I10 Essential (primary) hypertension: Secondary | ICD-10-CM

## 2021-06-29 ENCOUNTER — Telehealth: Payer: Medicare Other | Admitting: Internal Medicine

## 2021-06-30 ENCOUNTER — Other Ambulatory Visit (HOSPITAL_BASED_OUTPATIENT_CLINIC_OR_DEPARTMENT_OTHER): Payer: Self-pay

## 2021-06-30 DIAGNOSIS — Z23 Encounter for immunization: Secondary | ICD-10-CM | POA: Diagnosis not present

## 2021-06-30 MED ORDER — PFIZER COVID-19 VAC BIVALENT 30 MCG/0.3ML IM SUSP
INTRAMUSCULAR | 0 refills | Status: AC
  Filled 2021-06-30: qty 0.3, 1d supply, fill #0

## 2021-07-06 NOTE — Progress Notes (Signed)
HPI: FU coronary artery disease. She is status post PCI of her circumflex and right coronary artery with drug-eluting stents in August 2008. Abdominal ultrasound in January 2014 showed moderate right and severe left common iliac stenosis. ABIs in January of 2014 were normal. Patient seen by Dr Fletcher Anon and medical therapy recommended for now. Pt had probable stress induced CM 6/17 in setting of urosepsis. Also with GI bleed 7/17 related to gastic AVM; ablated. Patient had syncope September 2020 felt secondary to dehydration/orthostasis from chronic diarrhea.  Carotid Doppler September 2020 showed 1 to 39% bilateral stenosis.  There was note of right subclavian stenosis.  Echocardiogram October 2021 showed normal LV function, mild asymmetric left ventricular hypertrophy, grade 2 diastolic dysfunction, mild aortic stenosis with mean gradient 10 mmHg and mild aortic insufficiency.  Nuclear study November 2021 showed ejection fraction 67% and no ischemia or infarction.  Since I last saw her, she has chronic dyspnea on exertion.  No orthopnea, PND, pedal edema, chest pain, palpitations or syncope.  Current Outpatient Medications  Medication Sig Dispense Refill   amLODipine (NORVASC) 10 MG tablet TAKE ONE TABLET BY MOUTH DAILY - NEED OFFICE VISIT 90 tablet 0   aspirin EC 81 MG tablet Take 1 tablet (81 mg total) by mouth 2 (two) times daily at 8 am and 10 pm. 30 tablet 0   atorvastatin (LIPITOR) 40 MG tablet Take 1 tablet (40 mg total) by mouth daily. Pt needs office visit for further refills 30 tablet 0   CALCIUM PO Take 1 tablet by mouth daily.     carvedilol (COREG) 25 MG tablet TAKE ONE TABLET BY MOUTH TWICE A DAY WITH A MEAL 180 tablet 1   COVID-19 mRNA bivalent vaccine, Pfizer, (PFIZER COVID-19 VAC BIVALENT) injection Inject into the muscle. 0.3 mL 0   doxycycline (VIBRA-TABS) 100 MG tablet Take 1 tablet (100 mg total) by mouth 2 (two) times daily. 60 tablet 11   furosemide (LASIX) 20 MG tablet TAKE  ONE TABLET BY MOUTH EVERY OTHER DAY 45 tablet 3   hydrALAZINE (APRESOLINE) 25 MG tablet TAKE ONE TABLET BY MOUTH EVERY 8 HOURS 270 tablet 1   KLOR-CON M20 20 MEQ tablet TAKE ONE TABLET BY MOUTH DAILY 90 tablet 3   lisinopril (ZESTRIL) 40 MG tablet TAKE ONE TABLET BY MOUTH DAILY 90 tablet 1   Magnesium Oxide 200 MG TABS Take 200 mg by mouth every morning.      methocarbamol (ROBAXIN) 500 MG tablet Take 1 tablet (500 mg total) by mouth every 6 (six) hours as needed for muscle spasms. 20 tablet 0   Multiple Vitamin (MULITIVITAMIN WITH MINERALS) TABS Take 1 tablet by mouth daily.     naproxen sodium (ALEVE) 220 MG tablet Take 440 mg by mouth daily as needed (pain).     oxyCODONE (OXY IR/ROXICODONE) 5 MG immediate release tablet Take 1 tablet (5 mg total) by mouth every 4 (four) hours as needed for moderate pain (pain score 4-6). 30 tablet 0   Probiotic Product (PROBIOTIC DAILY PO) Take 1 capsule by mouth daily.     rifampin (RIFADIN) 300 MG capsule Take 1 capsule (300 mg total) by mouth 2 (two) times daily. 60 capsule 11   traMADol (ULTRAM) 50 MG tablet Take 1-2 tablets (50-100 mg total) by mouth every 12 (twelve) hours as needed for moderate pain. 30 tablet 0   No current facility-administered medications for this visit.     Past Medical History:  Diagnosis Date  Arthritis    CAD (coronary artery disease)    a. 02/2007 s/p PCI/DES ot LCX and RCA;  b. 07/2014 MV: no ischemia/infarct, EF 68%.   Carotid arterial disease ([place])    a. 05/2015 Carotid U/S: RICA 2-58%, LICA 52-77%, RECA 8-24%, LECA >50% - overall stable, f/u 1 yr.   Complication of anesthesia    Three days after procedure pt statrted to suffer with memoy loss that took about 4-5 days to come back according to [name]."   Dilated cardiomyopathy ([place])    a. 05/2005 Echo: EF 65%; b. 01/22/2016 Echo: EF 25-30% (in setting of admission for urosepsis w/ elev trop); c. 01/26/2016 Echo: EF 35-40%, inf, distal apical, apical, and mid to  distal septal HK-->felt to be 2/2 stress induced CM.   Heart murmur    mild AS 04/2020   History of bronchitis    Hyperlipidemia    Hypertension    Hypertensive heart disease    Memory impairment    MRSA (methicillin resistant staph aureus) culture positive    Nephrolithiasis    a. 12/2015 UVJ stone w/ hydroureteronephrosis req nephrostomy tube placement.   Osteoarthritis    a. 08/3534 s/p L TKA complicated by infections req skin grafting.   PVD (peripheral vascular disease) ([place])    a. Bilat common iliac dzs, nl ABI's --> med rx.   Sepsis ([place])    a. 12/2015 admitted with Proteus mirabilis urosepsis/bacteremia.   Wears glasses     Past Surgical History:  Procedure Laterality Date   ABDOMINAL HYSTERECTOMY     APPLICATION OF A-CELL OF EXTREMITY Left 12/08/2015   Procedure: APPLICATION OF A-CELL OF knee;  Surgeon: Loel Lofty [name], DO;  Location: [place];  Service: Plastics;  Laterality: Left;   APPLICATION OF WOUND VAC Left 11/16/2015   Procedure: APPLICATION OF WOUND VAC;  Surgeon: Loel Lofty [name], DO;  Location: [facility] [place];  Service: Plastics;  Laterality: Left;   APPLICATION OF WOUND VAC Left 12/08/2015   Procedure: APPLICATION OF WOUND VAC  AND CAST to knee;  Surgeon: Loel Lofty [name], DO;  Location: [place];  Service: Plastics;  Laterality: Left;   COLONOSCOPY     CORONARY ANGIOPLASTY  07   3 stents placed   ESOPHAGOGASTRODUODENOSCOPY     EYE SURGERY Bilateral    cataracts   FREE FLAP TO EXTREMITY Left 11/16/2015   Procedure: FREE FLAP TO EXTREMITY;  Surgeon: Loel Lofty [name], DO;  Location: [facility];  Service: Plastics;  Laterality: Left;   I & D EXTREMITY Left 10/18/2015   Procedure: Left Knee Washout, Reclosure;  Surgeon: Newt Minion, MD;  Location: [facility];  Service: Orthopedics;  Laterality: Left;   I & D EXTREMITY Left 12/08/2015   Procedure: IRRIGATION AND DEBRIDEMENT knee;  Surgeon: Loel Lofty [name], DO;  Location: [place] [facility];  Service: Plastics;  Laterality: Left;   I & D  KNEE WITH POLY EXCHANGE Left 10/12/2015   Procedure: Irrigation and Debridement , Poly Exchange, Antibiotic Beads Left Knee;  Surgeon: Newt Minion, MD;  Location: [facility];  Service: Orthopedics;  Laterality: Left;   INCISION AND DRAINAGE OF WOUND Left 01/04/2016   Procedure: DEBRIDEMENT OF LEFT KNEE WOUND WITH ACELL AND WOUND VAC PLACEMENT;  Surgeon: Loel Lofty [name], DO;  Location: [facility];  Service: Plastics;  Laterality: Left;   IR GENERIC HISTORICAL  05/11/2016   IR FLUORO GUIDE CV MIDLINE PICC RIGHT 05/11/2016 Sandi Mariscal, MD [facility]   IR GENERIC HISTORICAL  05/11/2016  IR US GUIDE VASC ACCESS RIGHT 05/11/2016 Sandi Mariscal, MD [facility]   KNEE CLOSED REDUCTION Left 01/16/2017   Procedure: CLOSED MANIPULATION LEFT KNEE;  Surgeon: Leandrew Koyanagi, MD;  Location: [place];  Service: Orthopedics;  Laterality: Left;   KNEE SURGERY     denies   SKIN SPLIT GRAFT Left 11/16/2015   Procedure: LEFT GASTROC MUSCLE FLAP WITH SKIN GRAFT SPLIT THICKNESS AND VAC PLACEMENT;  Surgeon: Loel Lofty [name], DO;  Location: [place];  Service: Plastics;  Laterality: Left;   TOTAL HIP ARTHROPLASTY Right 09/10/2012   Dr Sharol Given   TOTAL HIP ARTHROPLASTY Right 09/10/2012   Procedure: TOTAL HIP ARTHROPLASTY;  Surgeon: Newt Minion, MD;  Location: [facility];  Service: Orthopedics;  Laterality: Right;  Right Total Hip Arthroplasty   TOTAL HIP ARTHROPLASTY Left 06/27/2016   Procedure: LEFT TOTAL HIP ARTHROPLASTY ANTERIOR APPROACH;  Surgeon: Leandrew Koyanagi, MD;  Location: [facility];  Service: Orthopedics;  Laterality: Left;   TOTAL KNEE ARTHROPLASTY Left 09/28/2015   Procedure: TOTAL KNEE ARTHROPLASTY;  Surgeon: Newt Minion, MD;  Location: [facility];  Service: Orthopedics;  Laterality: Left;   TOTAL KNEE ARTHROPLASTY Right 07/05/2020   TOTAL KNEE ARTHROPLASTY Right 07/05/2020   Procedure: RIGHT TOTAL KNEE ARTHROPLASTY;  Surgeon: Mcarthur Rossetti, MD;  Location: [facility];  Service: Orthopedics;  Laterality: Right;     Social History   Socioeconomic History   Marital status: Single    Spouse name: Not on file   Number of children: Not on file   Years of education: Not on file   Highest education level: Not on file  Occupational History   Occupation: Retired  Tobacco Use   Smoking status: Former    Packs/day: 0.50    Years: 50.00    Pack years: 25.00    Types: Cigarettes    Quit date: 09/28/2015    Years since quitting: 5.7   Smokeless tobacco: Never   Tobacco comments:    quit 09/27/15  Vaping Use   Vaping Use: Never used  Substance and Sexual Activity   Alcohol use: No   Drug use: No   Sexual activity: Not Currently  Other Topics Concern   Not on file  Social History Narrative   She has been living with her daughter when not in rehab since the surgery in March.   Social Determinants of Health   Financial Resource Strain: Not on file  Food Insecurity: Not on file  Transportation Needs: Not on file  Physical Activity: Not on file  Stress: Not on file  Social Connections: Not on file  Intimate Partner Violence: Not on file    Family History  Problem Relation Age of Onset   Breast cancer Mother    Leukemia Father    Stroke Sister    Bone cancer Other     ROS: no fevers or chills, productive cough, hemoptysis, dysphasia, odynophagia, melena, hematochezia, dysuria, hematuria, rash, seizure activity, orthopnea, PND, pedal edema, claudication. Remaining systems are negative.  Physical Exam: Well-developed well-nourished in no acute distress.  Skin is warm and dry.  HEENT is normal.  Neck is supple.  Chest is clear to auscultation with normal expansion.  Cardiovascular exam is regular rate and rhythm.  2/6 systolic murmur left sternal border. Abdominal exam nontender or distended. No masses palpated. Extremities show no edema. neuro grossly intact  ECG-normal sinus rhythm at a rate of 73, first-degree AV block, no ST changes.  Personally reviewed  A/P  1 coronary  artery disease-patient denies chest pain.  Most recent nuclear study showed no ischemia.  Continue medical therapy with aspirin and statin.  2 history of mild aortic stenosis/mild aortic insufficiency-we will plan follow-up echocardiogram.  3 hypertension-blood pressure controlled.  Continue present medications.  4 hyperlipidemia-recent LDL not at goal.  Increase Lipitor to 80 mg daily.  Check lipids and liver in 8 weeks.  5 history of stress-induced cardiomyopathy-LV function has normalized on most recent echocardiogram.  6 peripheral vascular disease-she denies claudication.  Continue medical therapy with aspirin and statin.  7 carotid artery disease-mild on most recent Dopplers.  Kirk Ruths, MD

## 2021-07-08 ENCOUNTER — Other Ambulatory Visit: Payer: Self-pay | Admitting: Family Medicine

## 2021-07-08 DIAGNOSIS — I10 Essential (primary) hypertension: Secondary | ICD-10-CM

## 2021-07-11 ENCOUNTER — Ambulatory Visit (HOSPITAL_BASED_OUTPATIENT_CLINIC_OR_DEPARTMENT_OTHER): Payer: Medicare Other

## 2021-07-13 ENCOUNTER — Encounter: Payer: Self-pay | Admitting: Cardiology

## 2021-07-13 ENCOUNTER — Other Ambulatory Visit: Payer: Self-pay

## 2021-07-13 ENCOUNTER — Ambulatory Visit (INDEPENDENT_AMBULATORY_CARE_PROVIDER_SITE_OTHER): Payer: Medicare Other | Admitting: Cardiology

## 2021-07-13 VITALS — BP 122/60 | HR 73 | Ht 64.0 in | Wt 111.8 lb

## 2021-07-13 DIAGNOSIS — I350 Nonrheumatic aortic (valve) stenosis: Secondary | ICD-10-CM

## 2021-07-13 DIAGNOSIS — I679 Cerebrovascular disease, unspecified: Secondary | ICD-10-CM | POA: Diagnosis not present

## 2021-07-13 DIAGNOSIS — E7800 Pure hypercholesterolemia, unspecified: Secondary | ICD-10-CM

## 2021-07-13 DIAGNOSIS — I10 Essential (primary) hypertension: Secondary | ICD-10-CM | POA: Diagnosis not present

## 2021-07-13 DIAGNOSIS — I2510 Atherosclerotic heart disease of native coronary artery without angina pectoris: Secondary | ICD-10-CM

## 2021-07-13 MED ORDER — ATORVASTATIN CALCIUM 80 MG PO TABS: 80.0000 mg | ORAL_TABLET | Freq: Every day | ORAL | 3 refills | Status: AC

## 2021-07-13 NOTE — Patient Instructions (Signed)
Medication Instructions:   INCREASE ATORVASTATIN TO 80 MG ONCE DAILY= 2 OF THE 40 MG TABLETS ONCE DAILY  *If you need a refill on your cardiac medications before your next appointment, please call your pharmacy*   Lab Work:  Your physician recommends that you return for lab work in: [place]  If you have labs (blood work) drawn today and your tests are completely normal, you will receive your results only by: [place] (if you have MyChart) OR A paper copy in the mail If you have any lab test that is abnormal or we need to change your treatment, we will call you to review the results.   Testing/Procedures:  Your physician has requested that you have an echocardiogram. Echocardiography is a painless test that uses sound waves to create images of your heart. It provides your doctor with information about the size and shape of your heart and how well your hearts chambers and valves are working. This procedure takes approximately one hour. There are no restrictions for this procedure. [place]  Follow-Up: At [facility], you and your health needs are our priority.  As part of our continuing mission to provide you with exceptional heart care, we have created designated Provider Care Teams.  These Care Teams include your primary Cardiologist (physician) and Advanced Practice Providers (APPs -  Physician Assistants and Nurse Practitioners) who all work together to provide you with the care you need, when you need it.  We recommend signing up for the patient portal called "MyChart".  Sign up information is provided on this After Visit Summary.  MyChart is used to connect with patients for Virtual Visits (Telemedicine).  Patients are able to view lab/test results, encounter notes, upcoming appointments, etc.  Non-urgent messages can be sent to your provider as well.   To learn more about what you can do with MyChart, go to [email or web address].    Your next  appointment:   12 month(s)  The format for your next appointment:   In Person  Provider:   Kirk Ruths, MD

## 2021-07-18 ENCOUNTER — Ambulatory Visit (INDEPENDENT_AMBULATORY_CARE_PROVIDER_SITE_OTHER): Payer: Medicare Other | Admitting: Physician Assistant

## 2021-07-18 ENCOUNTER — Encounter: Payer: Self-pay | Admitting: Physician Assistant

## 2021-07-18 ENCOUNTER — Ambulatory Visit (INDEPENDENT_AMBULATORY_CARE_PROVIDER_SITE_OTHER): Payer: Medicare Other

## 2021-07-18 ENCOUNTER — Other Ambulatory Visit: Payer: Self-pay

## 2021-07-18 DIAGNOSIS — Z96651 Presence of right artificial knee joint: Secondary | ICD-10-CM | POA: Diagnosis not present

## 2021-07-18 DIAGNOSIS — I2510 Atherosclerotic heart disease of native coronary artery without angina pectoris: Secondary | ICD-10-CM

## 2021-07-18 DIAGNOSIS — M25551 Pain in right hip: Secondary | ICD-10-CM | POA: Diagnosis not present

## 2021-07-18 MED ORDER — METHYLPREDNISOLONE ACETATE 40 MG/ML IJ SUSP
40.0000 mg | INTRAMUSCULAR | Status: AC | PRN
Start: 2021-07-18 — End: 2021-07-18
  Administered 2021-07-18: 11:00:00 40 mg via INTRA_ARTICULAR

## 2021-07-18 MED ORDER — LIDOCAINE HCL 1 % IJ SOLN
3.0000 mL | INTRAMUSCULAR | Status: AC | PRN
  Administered 2021-07-18: 11:00:00 3 mL

## 2021-07-18 NOTE — Progress Notes (Signed)
Office Visit Note   Patient: [name]           Date of Birth: [birth year]           MRN: [id number] Visit Date: 07/18/2021              Requested by: Courtney Mclean, MD [name] [address] [name],  [name] [id number] PCP: Courtney Mclean, MD   Assessment & Plan: Visit Diagnoses:  1. S/P TKR (total knee replacement), right   2. Pain in right hip     Plan: Recommend she perform IT band stretching exercises as shown.  We will see her back in 2 weeks to see how she is doing overall.  She tolerated the cortisone injection for the trochanteric bursitis today well.  Did discuss with her this may be coming from her back.  She continues to have pain that goes past the knee she may need work-up of her back is the source of her leg pain.  Questions were encouraged and answered both patient and her son who was present throughout the exam today.  Follow-Up Instructions: Return in about 2 weeks (around 08/01/2021).   Orders:  Orders Placed This Encounter  Procedures   Large Joint Inj   XR HIP UNILAT W OR W/O PELVIS 2-3 VIEWS RIGHT   XR Knee 1-2 Views Right   No orders of the defined types were placed in this encounter.     Procedures: Large Joint Inj on 07/18/2021 11:26 AM Indications: pain Details: 22 G 1.5 in needle, lateral approach  Arthrogram: No  Medications: 3 mL lidocaine 1 %; 40 mg methylPREDNISolone acetate 40 MG/ML Outcome: tolerated well, no immediate complications Procedure, treatment alternatives, risks and benefits explained, specific risks discussed. Consent was given by the patient. Immediately prior to procedure a time out was called to verify the correct patient, procedure, equipment, support staff and site/side marked as required. Patient was prepped and draped in the usual sterile fashion.      Clinical Data: No additional findings.   Subjective: Chief Complaint  Patient presents with   Right Hip - Pain   Right Knee - Pain     HPI [name] 80 year old female well-known to [name] service comes in today with pain that is involves her right hip and knee.  She denies any numbness tingling down the leg.  She does have a history of right total knee arthroplasty 07/05/2020.  She is also had right hip replacement.  She states the pain has been ongoing for the past 2 weeks no falls or injuries.  She has tried Aleve and tramadol without any real relief.  She denies any fevers chills or recent vaccines.  She is nondiabetic.  Denies any back pain. Review of Systems  See HPI  Objective: Vital Signs: There were no vitals taken for this visit.  Physical Exam General well-developed thin emale no acute distress mood and affect appropriate.  Psych alert and oriented x3. Ortho Exam Bilateral hips good range of motion of both hips.  Tenderness over the trochanteric region both hips right greater than left.  Tenderness down the right IT band.  Negative straight leg raise bilaterally.  Right knee good range of motion without pain.  No instability valgus varus stressing right knee surgical incisions well-healed.  No abnormal warmth erythema or effusion right knee. Specialty Comments:  No specialty comments available.  Imaging: XR HIP UNILAT W OR W/O PELVIS 2-3 VIEWS RIGHT  Result  Date: 07/18/2021 AP pelvis lateral view of the right hip: Status post right total hip arthroplasty with well-seated components.  No acute fractures.  Both hips well located.  XR Knee 1-2 Views Right  Result Date: 07/18/2021 Right knee 2 views: No acute fractures.  Status post right total knee arthroplasty with well-seated components.  No bony abnormalities otherwise.  Knee is well located.    PMFS History: Patient Active Problem List   Diagnosis Date Noted   Prediabetes 06/12/2021   Status post total right knee replacement 07/05/2020   Syncope 04/01/2019   Closed fracture of multiple pubic rami ([place]) 07/22/2018   Closed fracture of  right superior rim of pubis ([place])    Lateral dislocation of left patella 01/15/2017   Unilateral primary osteoarthritis, right knee 01/15/2017   Osteoporosis 11/26/2016   Acute pain of left knee 08/27/2016   Chronic diarrhea 08/07/2016   Left displaced femoral neck fracture ([place]) 06/26/2016   MRSA (methicillin resistant Staphylococcus aureus) infection 06/26/2016   Hypertensive heart disease    Hydronephrosis    Obstructive uropathy    Abnormal echocardiogram 01/23/2016   Pressure ulcer 01/21/2016   Chronic infection of prosthetic knee ([place]) 10/12/2015   AAA 01/19/2009   ILIAC ARTERY ANEURYSM 01/19/2009   Hyperlipidemia 01/14/2009   TOBACCO ABUSE 01/14/2009   Essential hypertension 01/14/2009   Coronary atherosclerosis 01/14/2009   Cerebrovascular disease 01/14/2009   Peripheral vascular disease ([place]) 01/14/2009   CHRONIC OBSTRUCTIVE PULMONARY DISEASE 01/14/2009   Past Medical History:  Diagnosis Date   Arthritis    CAD (coronary artery disease)    a. 02/2007 s/p PCI/DES ot LCX and RCA;  b. 07/2014 MV: no ischemia/infarct, EF 68%.   Carotid arterial disease ([place])    a. 05/2015 Carotid U/S: RICA 5-09%, LICA 32-67%, RECA 1-24%, LECA >50% - overall stable, f/u 1 yr.   Complication of anesthesia    Three days after procedure pt statrted to suffer with memoy loss that took about 4-5 days to come back according to pt son [name]."   Dilated cardiomyopathy ([place])    a. 05/2005 Echo: EF 65%; b. 01/22/2016 Echo: EF 25-30% (in setting of admission for urosepsis w/ elev trop); c. 01/26/2016 Echo: EF 35-40%, inf, distal apical, apical, and mid to distal septal HK-->felt to be 2/2 stress induced CM.   Heart murmur    mild AS 04/2020   History of bronchitis    Hyperlipidemia    Hypertension    Hypertensive heart disease    Memory impairment    MRSA (methicillin resistant staph aureus) culture positive    Nephrolithiasis    a. 12/2015 UVJ stone w/ hydroureteronephrosis req nephrostomy tube  placement.   Osteoarthritis    a. 11/8097 s/p L TKA complicated by infections req skin grafting.   PVD (peripheral vascular disease) ([place])    a. Bilat common iliac dzs, nl ABI's --> med rx.   Sepsis ([place])    a. 12/2015 admitted with Proteus mirabilis urosepsis/bacteremia.   Wears glasses     Family History  Problem Relation Age of Onset   Breast cancer Mother    Leukemia Father    Stroke Sister    Bone cancer Other     Past Surgical History:  Procedure Laterality Date   ABDOMINAL HYSTERECTOMY     APPLICATION OF A-CELL OF EXTREMITY Left 12/08/2015   Procedure: APPLICATION OF A-CELL OF knee;  Surgeon: Loel Lofty [name], DO;  Location: [place] [facility];  Service: Plastics;  Laterality: Left;   APPLICATION OF  WOUND VAC Left 11/16/2015   Procedure: APPLICATION OF WOUND VAC;  Surgeon: Loel Lofty [name], DO;  Location: [place];  Service: Plastics;  Laterality: Left;   APPLICATION OF WOUND VAC Left 12/08/2015   Procedure: APPLICATION OF WOUND VAC  AND CAST to knee;  Surgeon: Loel Lofty [name], DO;  Location: [facility];  Service: Plastics;  Laterality: Left;   COLONOSCOPY     CORONARY ANGIOPLASTY  07   3 stents placed   ESOPHAGOGASTRODUODENOSCOPY     EYE SURGERY Bilateral    cataracts   FREE FLAP TO EXTREMITY Left 11/16/2015   Procedure: FREE FLAP TO EXTREMITY;  Surgeon: Loel Lofty [name], DO;  Location: [place];  Service: Plastics;  Laterality: Left;   I & D EXTREMITY Left 10/18/2015   Procedure: Left Knee Washout, Reclosure;  Surgeon: Newt Minion, MD;  Location: [place];  Service: Orthopedics;  Laterality: Left;   I & D EXTREMITY Left 12/08/2015   Procedure: IRRIGATION AND DEBRIDEMENT knee;  Surgeon: Loel Lofty [name], DO;  Location: [place];  Service: Plastics;  Laterality: Left;   I & D KNEE WITH POLY EXCHANGE Left 10/12/2015   Procedure: Irrigation and Debridement , Poly Exchange, Antibiotic Beads Left Knee;  Surgeon: Newt Minion, MD;  Location: [place];  Service: Orthopedics;  Laterality: Left;    INCISION AND DRAINAGE OF WOUND Left 01/04/2016   Procedure: DEBRIDEMENT OF LEFT KNEE WOUND WITH ACELL AND WOUND VAC PLACEMENT;  Surgeon: Loel Lofty [name], DO;  Location: [place];  Service: Plastics;  Laterality: Left;   IR GENERIC HISTORICAL  05/11/2016   IR FLUORO GUIDE CV MIDLINE PICC RIGHT 05/11/2016 Sandi Mariscal, MD [facility]   IR GENERIC HISTORICAL  05/11/2016   IR US GUIDE VASC ACCESS RIGHT 05/11/2016 Sandi Mariscal, MD [facility]   KNEE CLOSED REDUCTION Left 01/16/2017   Procedure: CLOSED MANIPULATION LEFT KNEE;  Surgeon: Leandrew Koyanagi, MD;  Location: [place];  Service: Orthopedics;  Laterality: Left;   KNEE SURGERY     denies   SKIN SPLIT GRAFT Left 11/16/2015   Procedure: LEFT GASTROC MUSCLE FLAP WITH SKIN GRAFT SPLIT THICKNESS AND VAC PLACEMENT;  Surgeon: Loel Lofty [name], DO;  Location: [place];  Service: Plastics;  Laterality: Left;   TOTAL HIP ARTHROPLASTY Right 09/10/2012   Dr Sharol Given   TOTAL HIP ARTHROPLASTY Right 09/10/2012   Procedure: TOTAL HIP ARTHROPLASTY;  Surgeon: Newt Minion, MD;  Location: [place];  Service: Orthopedics;  Laterality: Right;  Right Total Hip Arthroplasty   TOTAL HIP ARTHROPLASTY Left 06/27/2016   Procedure: LEFT TOTAL HIP ARTHROPLASTY ANTERIOR APPROACH;  Surgeon: Leandrew Koyanagi, MD;  Location: [place];  Service: Orthopedics;  Laterality: Left;   TOTAL KNEE ARTHROPLASTY Left 09/28/2015   Procedure: TOTAL KNEE ARTHROPLASTY;  Surgeon: Newt Minion, MD;  Location: [place];  Service: Orthopedics;  Laterality: Left;   TOTAL KNEE ARTHROPLASTY Right 07/05/2020   TOTAL KNEE ARTHROPLASTY Right 07/05/2020   Procedure: RIGHT TOTAL KNEE ARTHROPLASTY;  Surgeon: Mcarthur Rossetti, MD;  Location: [place];  Service: Orthopedics;  Laterality: Right;   Social History   Occupational History   Occupation: Retired  Tobacco Use   Smoking status: Former    Packs/day: 0.50    Years: 50.00    Pack years: 25.00    Types: Cigarettes    Quit date: 09/28/2015     Years since quitting: 5.8   Smokeless tobacco: Never   Tobacco comments:    quit 09/27/15  Vaping Use   Vaping Use: Never used  Substance and Sexual Activity   Alcohol use: No   Drug use: No   Sexual activity: Not Currently

## 2021-07-19 ENCOUNTER — Telehealth: Payer: Self-pay | Admitting: Orthopaedic Surgery

## 2021-07-19 ENCOUNTER — Other Ambulatory Visit: Payer: Self-pay | Admitting: Physician Assistant

## 2021-07-19 DIAGNOSIS — G8929 Other chronic pain: Secondary | ICD-10-CM

## 2021-07-19 DIAGNOSIS — Z96652 Presence of left artificial knee joint: Secondary | ICD-10-CM | POA: Diagnosis not present

## 2021-07-19 MED ORDER — TRAMADOL HCL 50 MG PO TABS: 50.0000 mg | ORAL_TABLET | Freq: Two times a day (BID) | ORAL | 0 refills | Status: DC | PRN

## 2021-07-19 NOTE — Telephone Encounter (Signed)
Called and advised pt.

## 2021-07-19 NOTE — Telephone Encounter (Signed)
Pt called requesting a refill of tramadol. Please send to pharmacy on file. Pt is asking for a call back when and if medication can be call in. Pt phone number is [phone]

## 2021-07-19 NOTE — Telephone Encounter (Signed)
Please advise 

## 2021-07-19 NOTE — Progress Notes (Unsigned)
trama

## 2021-08-02 ENCOUNTER — Ambulatory Visit (HOSPITAL_COMMUNITY): Payer: Medicare Other | Attending: Cardiology

## 2021-08-02 ENCOUNTER — Other Ambulatory Visit: Payer: Self-pay

## 2021-08-02 DIAGNOSIS — I350 Nonrheumatic aortic (valve) stenosis: Secondary | ICD-10-CM | POA: Diagnosis not present

## 2021-08-02 LAB — ECHOCARDIOGRAM COMPLETE
AR max vel: 1.77 cm2
AV Area VTI: 2.07 cm2
AV Area mean vel: 1.96 cm2
AV Mean grad: 14.3 mmHg
AV Peak grad: 28 mmHg
Ao pk vel: 2.65 m/s
Area-P 1/2: 2.38 cm2
P 1/2 time: 511 msec
S' Lateral: 2.6 cm

## 2021-08-05 ENCOUNTER — Other Ambulatory Visit: Payer: Self-pay | Admitting: Cardiology

## 2021-08-07 ENCOUNTER — Ambulatory Visit: Payer: Medicare Other | Admitting: Physician Assistant

## 2021-08-07 ENCOUNTER — Encounter: Payer: Self-pay | Admitting: *Deleted

## 2021-08-14 ENCOUNTER — Ambulatory Visit (INDEPENDENT_AMBULATORY_CARE_PROVIDER_SITE_OTHER): Payer: Medicare Other | Admitting: Physician Assistant

## 2021-08-14 ENCOUNTER — Ambulatory Visit (INDEPENDENT_AMBULATORY_CARE_PROVIDER_SITE_OTHER): Payer: Medicare Other

## 2021-08-14 ENCOUNTER — Encounter: Payer: Self-pay | Admitting: Physician Assistant

## 2021-08-14 DIAGNOSIS — M5441 Lumbago with sciatica, right side: Secondary | ICD-10-CM

## 2021-08-14 DIAGNOSIS — G8929 Other chronic pain: Secondary | ICD-10-CM | POA: Diagnosis not present

## 2021-08-14 NOTE — Progress Notes (Signed)
Office Visit Note   Patient: [name]           Date of Birth: [birth year]           MRN: [id number] Visit Date: 08/14/2021              Requested by: Darreld Mclean, MD [address] [place],  [place] [address] PCP: Darreld Mclean, MD   Assessment & Plan: Visit Diagnoses:  1. Chronic right-sided low back pain with right-sided sciatica     Plan: Recommend formal physical therapy for back.  Patient is agreeable with trying therapy.  She is given a prescription for physical therapy to work on range of motion core strengthening and hamstring and IT band stretching.  She will follow-up with [place] 6 weeks see how she is doing overall.  Questions were encouraged and answered at length today.  Follow-Up Instructions: Return in about 6 weeks (around 09/25/2021).   Orders:  Orders Placed This Encounter  Procedures   XR Lumbar Spine 2-3 Views   No orders of the defined types were placed in this encounter.     Procedures: No procedures performed   Clinical Data: No additional findings.   Subjective: Chief Complaint  Patient presents with   Right Hip - Pain   Right Knee - Pain    HPI [name] returns today status post trochanteric injection on 07/18/2021.  She states she had very little improvement.  She is now having pain that radiates from her hip down to her ankle ranks pain to be 6-7 out of 10 pain.  She has pain with walking.  She does note that she wakes at night due to the back pain and leg pain whenever she rolls over.  She denies any saddle anesthesia like symptoms.  She has had urinary incontinence for over 2 years.  She has recently had some constipation but this is resolved. Review of Systems   Objective: Vital Signs: There were no vitals taken for this visit.  Physical Exam General: Well-developed well-nourished female no acute distress seated in wheelchair. Ortho Exam Lower extremities 5 out of 5 strengths bilaterally throughout.   Positive straight leg raise on the right.  She has tenderness over the right lower lumbar paraspinous region.  Good range of motion both hips without pain. Specialty Comments:  No specialty comments available.  Imaging: XR Lumbar Spine 2-3 Views  Result Date: 08/14/2021 Lumbar spine 2 views: No acute fractures.  No spinal listhesis.  Disc base overall well-maintained.  Lower lumbar facet changes.  Severe arthrosclerosis aorta is noted.    PMFS History: Patient Active Problem List   Diagnosis Date Noted   Prediabetes 06/12/2021   Status post total right knee replacement 07/05/2020   Syncope 04/01/2019   Closed fracture of multiple pubic rami ([facility]) 07/22/2018   Closed fracture of right superior rim of pubis ([facility])    Lateral dislocation of left patella 01/15/2017   Unilateral primary osteoarthritis, right knee 01/15/2017   Osteoporosis 11/26/2016   Acute pain of left knee 08/27/2016   Chronic diarrhea 08/07/2016   Left displaced femoral neck fracture ([facility]) 06/26/2016   MRSA (methicillin resistant Staphylococcus aureus) infection 06/26/2016   Hypertensive heart disease    Hydronephrosis    Obstructive uropathy    Abnormal echocardiogram 01/23/2016   Pressure ulcer 01/21/2016   Chronic infection of prosthetic knee ([facility]) 10/12/2015   AAA 01/19/2009   ILIAC ARTERY ANEURYSM 01/19/2009   Hyperlipidemia 01/14/2009  TOBACCO ABUSE 01/14/2009   Essential hypertension 01/14/2009   Coronary atherosclerosis 01/14/2009   Cerebrovascular disease 01/14/2009   Peripheral vascular disease ([facility]) 01/14/2009   CHRONIC OBSTRUCTIVE PULMONARY DISEASE 01/14/2009   Past Medical History:  Diagnosis Date   Arthritis    CAD (coronary artery disease)    a. 02/2007 s/p PCI/DES ot LCX and RCA;  b. 07/2014 MV: no ischemia/infarct, EF 68%.   Carotid arterial disease ([facility])    a. 05/2015 Carotid U/S: RICA 8-33%, LICA 82-50%, RECA 5-39%, LECA >50% - overall stable, f/u 1 yr.   Complication of anesthesia     Three days after procedure pt statrted to suffer with memoy loss that took about 4-5 days to come back according to pt son [name]."   Dilated cardiomyopathy ([facility])    a. 05/2005 Echo: EF 65%; b. 01/22/2016 Echo: EF 25-30% (in setting of admission for urosepsis w/ elev trop); c. 01/26/2016 Echo: EF 35-40%, inf, distal apical, apical, and mid to distal septal HK-->felt to be 2/2 stress induced CM.   Heart murmur    mild AS 04/2020   History of bronchitis    Hyperlipidemia    Hypertension    Hypertensive heart disease    Memory impairment    MRSA (methicillin resistant staph aureus) culture positive    Nephrolithiasis    a. 12/2015 UVJ stone w/ hydroureteronephrosis req nephrostomy tube placement.   Osteoarthritis    a. 01/6733 s/p L TKA complicated by infections req skin grafting.   PVD (peripheral vascular disease) ([facility])    a. Bilat common iliac dzs, nl ABI's --> med rx.   Sepsis ([facility])    a. 12/2015 admitted with Proteus mirabilis urosepsis/bacteremia.   Wears glasses     Family History  Problem Relation Age of Onset   Breast cancer Mother    Leukemia Father    Stroke Sister    Bone cancer Other     Past Surgical History:  Procedure Laterality Date   ABDOMINAL HYSTERECTOMY     APPLICATION OF A-CELL OF EXTREMITY Left 12/08/2015   Procedure: APPLICATION OF A-CELL OF knee;  Surgeon: Loel Lofty [name], DO;  Location: [facility];  Service: Plastics;  Laterality: Left;   APPLICATION OF WOUND VAC Left 11/16/2015   Procedure: APPLICATION OF WOUND VAC;  Surgeon: Loel Lofty [name], DO;  Location: [facility];  Service: Plastics;  Laterality: Left;   APPLICATION OF WOUND VAC Left 12/08/2015   Procedure: APPLICATION OF WOUND VAC  AND CAST to knee;  Surgeon: Loel Lofty [name], DO;  Location: [facility];  Service: Plastics;  Laterality: Left;   COLONOSCOPY     CORONARY ANGIOPLASTY  07   3 stents placed   ESOPHAGOGASTRODUODENOSCOPY     EYE SURGERY Bilateral    cataracts   FREE FLAP TO EXTREMITY Left 11/16/2015    Procedure: FREE FLAP TO EXTREMITY;  Surgeon: Loel Lofty [name], DO;  Location: [facility];  Service: Plastics;  Laterality: Left;   I & D EXTREMITY Left 10/18/2015   Procedure: Left Knee Washout, Reclosure;  Surgeon: Newt Minion, MD;  Location: [facility];  Service: Orthopedics;  Laterality: Left;   I & D EXTREMITY Left 12/08/2015   Procedure: IRRIGATION AND DEBRIDEMENT knee;  Surgeon: Loel Lofty [name], DO;  Location: [facility];  Service: Plastics;  Laterality: Left;   I & D KNEE WITH POLY EXCHANGE Left 10/12/2015   Procedure: Irrigation and Debridement , Poly Exchange, Antibiotic Beads Left Knee;  Surgeon: Newt Minion, MD;  Location: [facility];  Service: Orthopedics;  Laterality: Left;   INCISION AND DRAINAGE OF WOUND Left 01/04/2016   Procedure: DEBRIDEMENT OF LEFT KNEE WOUND WITH ACELL AND WOUND VAC PLACEMENT;  Surgeon: Loel Lofty [name], DO;  Location: [facility];  Service: Plastics;  Laterality: Left;   IR GENERIC HISTORICAL  05/11/2016   IR FLUORO GUIDE CV MIDLINE PICC RIGHT 05/11/2016 Sandi Mariscal, MD [facility]   IR GENERIC HISTORICAL  05/11/2016   IR US GUIDE VASC ACCESS RIGHT 05/11/2016 Sandi Mariscal, MD [facility]   KNEE CLOSED REDUCTION Left 01/16/2017   Procedure: CLOSED MANIPULATION LEFT KNEE;  Surgeon: Leandrew Koyanagi, MD;  Location: [facility];  Service: Orthopedics;  Laterality: Left;   KNEE SURGERY     denies   SKIN SPLIT GRAFT Left 11/16/2015   Procedure: LEFT GASTROC MUSCLE FLAP WITH SKIN GRAFT SPLIT THICKNESS AND VAC PLACEMENT;  Surgeon: Loel Lofty [name], DO;  Location: [facility];  Service: Plastics;  Laterality: Left;   TOTAL HIP ARTHROPLASTY Right 09/10/2012   Dr Sharol Given   TOTAL HIP ARTHROPLASTY Right 09/10/2012   Procedure: TOTAL HIP ARTHROPLASTY;  Surgeon: Newt Minion, MD;  Location: [facility];  Service: Orthopedics;  Laterality: Right;  Right Total Hip Arthroplasty   TOTAL HIP ARTHROPLASTY Left 06/27/2016   Procedure: LEFT TOTAL HIP ARTHROPLASTY ANTERIOR APPROACH;  Surgeon:  Leandrew Koyanagi, MD;  Location: [facility];  Service: Orthopedics;  Laterality: Left;   TOTAL KNEE ARTHROPLASTY Left 09/28/2015   Procedure: TOTAL KNEE ARTHROPLASTY;  Surgeon: Newt Minion, MD;  Location: [facility];  Service: Orthopedics;  Laterality: Left;   TOTAL KNEE ARTHROPLASTY Right 07/05/2020   TOTAL KNEE ARTHROPLASTY Right 07/05/2020   Procedure: RIGHT TOTAL KNEE ARTHROPLASTY;  Surgeon: Mcarthur Rossetti, MD;  Location: [facility];  Service: Orthopedics;  Laterality: Right;   Social History   Occupational History   Occupation: Retired  Tobacco Use   Smoking status: Former    Packs/day: 0.50    Years: 50.00    Pack years: 25.00    Types: Cigarettes    Quit date: 09/28/2015    Years since quitting: 5.8   Smokeless tobacco: Never   Tobacco comments:    quit 09/27/15  Vaping Use   Vaping Use: Never used  Substance and Sexual Activity   Alcohol use: No   Drug use: No   Sexual activity: Not Currently

## 2021-08-22 ENCOUNTER — Inpatient Hospital Stay (HOSPITAL_BASED_OUTPATIENT_CLINIC_OR_DEPARTMENT_OTHER): Admission: RE | Admit: 2021-08-22 | Payer: Medicare Other | Source: Ambulatory Visit

## 2021-08-23 DIAGNOSIS — M545 Low back pain, unspecified: Secondary | ICD-10-CM | POA: Diagnosis not present

## 2021-08-25 ENCOUNTER — Ambulatory Visit (HOSPITAL_BASED_OUTPATIENT_CLINIC_OR_DEPARTMENT_OTHER): Payer: Medicare Other

## 2021-08-25 DIAGNOSIS — M545 Low back pain, unspecified: Secondary | ICD-10-CM | POA: Diagnosis not present

## 2021-08-28 ENCOUNTER — Other Ambulatory Visit: Payer: Self-pay | Admitting: Physician Assistant

## 2021-08-28 ENCOUNTER — Telehealth: Payer: Self-pay | Admitting: Physician Assistant

## 2021-08-28 DIAGNOSIS — G8929 Other chronic pain: Secondary | ICD-10-CM

## 2021-08-28 DIAGNOSIS — M25561 Pain in right knee: Secondary | ICD-10-CM

## 2021-08-28 DIAGNOSIS — M545 Low back pain, unspecified: Secondary | ICD-10-CM | POA: Diagnosis not present

## 2021-08-28 NOTE — Telephone Encounter (Signed)
Pt called requesting a refill of Tramadol. Please send to pharmacy on file. Pt ask for a call when medicine is called in. Phone number is [phone].

## 2021-08-30 DIAGNOSIS — M545 Low back pain, unspecified: Secondary | ICD-10-CM | POA: Diagnosis not present

## 2021-09-03 ENCOUNTER — Other Ambulatory Visit: Payer: Self-pay | Admitting: Cardiology

## 2021-09-05 DIAGNOSIS — M545 Low back pain, unspecified: Secondary | ICD-10-CM | POA: Diagnosis not present

## 2021-09-07 DIAGNOSIS — M545 Low back pain, unspecified: Secondary | ICD-10-CM | POA: Diagnosis not present

## 2021-09-11 DIAGNOSIS — M545 Low back pain, unspecified: Secondary | ICD-10-CM | POA: Diagnosis not present

## 2021-09-13 DIAGNOSIS — M545 Low back pain, unspecified: Secondary | ICD-10-CM | POA: Diagnosis not present

## 2021-09-25 ENCOUNTER — Encounter: Payer: Self-pay | Admitting: Physician Assistant

## 2021-09-25 ENCOUNTER — Ambulatory Visit (INDEPENDENT_AMBULATORY_CARE_PROVIDER_SITE_OTHER): Payer: Medicare Other | Admitting: Physician Assistant

## 2021-09-25 DIAGNOSIS — G8929 Other chronic pain: Secondary | ICD-10-CM

## 2021-09-25 DIAGNOSIS — M5441 Lumbago with sciatica, right side: Secondary | ICD-10-CM

## 2021-09-25 DIAGNOSIS — M5442 Lumbago with sciatica, left side: Secondary | ICD-10-CM

## 2021-09-25 NOTE — Progress Notes (Signed)
HPI: [name] returns today for follow-up of her low back pain.  We last saw her 6 weeks ago and since that time she has undergone physical therapy she feels like her radicular symptoms down both legs 50% better.  But she still states she has waking pain due to 8 to her legs.  States the pain radiates down the lateral aspect of both legs to just past the knees.  Ranks her pain to be 5 out of 10 pain at worst.  Review of systems: See HPI otherwise negative.  Physical exam: Bilateral hips good range of motion without pain.  Tenderness over the trochanteric region of both hips. Lower extremities 5 out of 5 strength throughout against resistance.  Negative straight leg raise test bilaterally.  Extension lag left knee.  Right knee full range of motion without pain.   Impression: Low back pain with radicular symptoms bilaterally  Plan: Given patient's minimal improvement with 6 weeks of physical therapy recommend MRI of her back due to the waking pain and radicular symptoms down both legs for hopeful epidural steroid planning.  She will follow-up after the MRI to go over results discuss further treatment.  Questions encouraged and answered.  She will continue therapy in the interim.

## 2021-09-25 NOTE — Addendum Note (Signed)
Addended by: Robyne Peers on: 09/25/2021 04:01 PM   Modules accepted: Orders

## 2021-09-26 ENCOUNTER — Telehealth: Payer: Self-pay | Admitting: Family Medicine

## 2021-09-26 ENCOUNTER — Ambulatory Visit (HOSPITAL_BASED_OUTPATIENT_CLINIC_OR_DEPARTMENT_OTHER): Payer: Medicare Other

## 2021-09-26 DIAGNOSIS — M545 Low back pain, unspecified: Secondary | ICD-10-CM | POA: Diagnosis not present

## 2021-09-26 NOTE — Telephone Encounter (Signed)
Pt has called back since she had a couple of missed calls. I have given her the number to the jabber line and she stated that she will call that number back.   FYI

## 2021-09-26 NOTE — Telephone Encounter (Signed)
I attempted to leave  message for patient to call back and schedule Medicare Annual Wellness Visit (AWV) in office. No voice mail.  If not able to come in office, please offer to do virtually or by telephone.  Left office number and my jabber #[phone].  Due for AWVI   Please schedule at anytime with Nurse Health Advisor.   

## 2021-09-26 NOTE — Telephone Encounter (Signed)
I called patient and she scheduled AWV on 10/02/21.

## 2021-09-28 DIAGNOSIS — M545 Low back pain, unspecified: Secondary | ICD-10-CM | POA: Diagnosis not present

## 2021-10-02 ENCOUNTER — Ambulatory Visit (INDEPENDENT_AMBULATORY_CARE_PROVIDER_SITE_OTHER): Payer: Medicare Other

## 2021-10-02 VITALS — BP 112/64 | HR 65 | Temp 97.7°F | Resp 16 | Ht 63.0 in | Wt 104.1 lb

## 2021-10-02 DIAGNOSIS — Z780 Asymptomatic menopausal state: Secondary | ICD-10-CM

## 2021-10-02 DIAGNOSIS — Z0000 Encounter for general adult medical examination without abnormal findings: Secondary | ICD-10-CM | POA: Diagnosis not present

## 2021-10-02 DIAGNOSIS — Z1231 Encounter for screening mammogram for malignant neoplasm of breast: Secondary | ICD-10-CM | POA: Diagnosis not present

## 2021-10-02 NOTE — Patient Instructions (Signed)
[name] , Thank you for taking time to come for your Medicare Wellness Visit. I appreciate your ongoing commitment to your health goals. Please review the following plan we discussed and let me know if I can assist you in the future.   Screening recommendations/referrals: Colonoscopy: no longer needed Mammogram: ordered Bone Density: ordered Recommended yearly ophthalmology/optometry visit for glaucoma screening and checkup Recommended yearly dental visit for hygiene and checkup  Vaccinations: Influenza vaccine: up to date Pneumococcal vaccine: up to date Tdap vaccine: up to date Shingles vaccine: Due-May obtain vaccine at our office or your local pharmacy.    Covid-19:completed  Advanced directives: Yes, will bring at next visit  Conditions/risks identified: see problem list  Next appointment: Follow up in one year for your annual wellness visit    Preventive Care 65 Years and Older, Female Preventive care refers to lifestyle choices and visits with your health care provider that can promote health and wellness. What does preventive care include? A yearly physical exam. This is also called an annual well check. Dental exams once or twice a year. Routine eye exams. Ask your health care provider how often you should have your eyes checked. Personal lifestyle choices, including: Daily care of your teeth and gums. Regular physical activity. Eating a healthy diet. Avoiding tobacco and drug use. Limiting alcohol use. Practicing safe sex. Taking low-dose aspirin every day. Taking vitamin and mineral supplements as recommended by your health care provider. What happens during an annual well check? The services and screenings done by your health care provider during your annual well check will depend on your age, overall health, lifestyle risk factors, and family history of disease. Counseling  Your health care provider may ask you questions about your: Alcohol use. Tobacco  use. Drug use. Emotional well-being. Home and relationship well-being. Sexual activity. Eating habits. History of falls. Memory and ability to understand (cognition). Work and work Statistician. Reproductive health. Screening  You may have the following tests or measurements: Height, weight, and BMI. Blood pressure. Lipid and cholesterol levels. These may be checked every 5 years, or more frequently if you are over 70 years old. Skin check. Lung cancer screening. You may have this screening every year starting at age 87 if you have a 30-pack-year history of smoking and currently smoke or have quit within the past 15 years. Fecal occult blood test (FOBT) of the stool. You may have this test every year starting at age 48. Flexible sigmoidoscopy or colonoscopy. You may have a sigmoidoscopy every 5 years or a colonoscopy every 10 years starting at age 102. Hepatitis C blood test. Hepatitis B blood test. Sexually transmitted disease (STD) testing. Diabetes screening. This is done by checking your blood sugar (glucose) after you have not eaten for a while (fasting). You may have this done every 1-3 years. Bone density scan. This is done to screen for osteoporosis. You may have this done starting at age 85. Mammogram. This may be done every 1-2 years. Talk to your health care provider about how often you should have regular mammograms. Talk with your health care provider about your test results, treatment options, and if necessary, the need for more tests. Vaccines  Your health care provider may recommend certain vaccines, such as: Influenza vaccine. This is recommended every year. Tetanus, diphtheria, and acellular pertussis (Tdap, Td) vaccine. You may need a Td booster every 10 years. Zoster vaccine. You may need this after age 67. Pneumococcal 13-valent conjugate (PCV13) vaccine. One dose is recommended after  age 43. Pneumococcal polysaccharide (PPSV23) vaccine. One dose is recommended  after age 28. Talk to your health care provider about which screenings and vaccines you need and how often you need them. This information is not intended to replace advice given to you by your health care provider. Make sure you discuss any questions you have with your health care provider. Document Released: 08/12/2015 Document Revised: 04/04/2016 Document Reviewed: 05/17/2015 Elsevier Interactive Patient Education  2017 [place] Prevention in the Home Falls can cause injuries. They can happen to people of all ages. There are many things you can do to make your home safe and to help prevent falls. What can I do on the outside of my home? Regularly fix the edges of walkways and driveways and fix any cracks. Remove anything that might make you trip as you walk through a door, such as a raised step or threshold. Trim any bushes or trees on the path to your home. Use bright outdoor lighting. Clear any walking paths of anything that might make someone trip, such as rocks or tools. Regularly check to see if handrails are loose or broken. Make sure that both sides of any steps have handrails. Any raised decks and porches should have guardrails on the edges. Have any leaves, snow, or ice cleared regularly. Use sand or salt on walking paths during winter. Clean up any spills in your garage right away. This includes oil or grease spills. What can I do in the bathroom? Use night lights. Install grab bars by the toilet and in the tub and shower. Do not use towel bars as grab bars. Use non-skid mats or decals in the tub or shower. If you need to sit down in the shower, use a plastic, non-slip stool. Keep the floor dry. Clean up any water that spills on the floor as soon as it happens. Remove soap buildup in the tub or shower regularly. Attach bath mats securely with double-sided non-slip rug tape. Do not have throw rugs and other things on the floor that can make you trip. What can I do  in the bedroom? Use night lights. Make sure that you have a light by your bed that is easy to reach. Do not use any sheets or blankets that are too big for your bed. They should not hang down onto the floor. Have a firm chair that has side arms. You can use this for support while you get dressed. Do not have throw rugs and other things on the floor that can make you trip. What can I do in the kitchen? Clean up any spills right away. Avoid walking on wet floors. Keep items that you use a lot in easy-to-reach places. If you need to reach something above you, use a strong step stool that has a grab bar. Keep electrical cords out of the way. Do not use floor polish or wax that makes floors slippery. If you must use wax, use non-skid floor wax. Do not have throw rugs and other things on the floor that can make you trip. What can I do with my stairs? Do not leave any items on the stairs. Make sure that there are handrails on both sides of the stairs and use them. Fix handrails that are broken or loose. Make sure that handrails are as long as the stairways. Check any carpeting to make sure that it is firmly attached to the stairs. Fix any carpet that is loose or worn. Avoid having throw rugs  at the top or bottom of the stairs. If you do have throw rugs, attach them to the floor with carpet tape. Make sure that you have a light switch at the top of the stairs and the bottom of the stairs. If you do not have them, ask someone to add them for you. What else can I do to help prevent falls? Wear shoes that: Do not have high heels. Have rubber bottoms. Are comfortable and fit you well. Are closed at the toe. Do not wear sandals. If you use a stepladder: Make sure that it is fully opened. Do not climb a closed stepladder. Make sure that both sides of the stepladder are locked into place. Ask someone to hold it for you, if possible. Clearly mark and make sure that you can see: Any grab bars or  handrails. First and last steps. Where the edge of each step is. Use tools that help you move around (mobility aids) if they are needed. These include: Canes. Walkers. Scooters. Crutches. Turn on the lights when you go into a dark area. Replace any light bulbs as soon as they burn out. Set up your furniture so you have a clear path. Avoid moving your furniture around. If any of your floors are uneven, fix them. If there are any pets around you, be aware of where they are. Review your medicines with your doctor. Some medicines can make you feel dizzy. This can increase your chance of falling. Ask your doctor what other things that you can do to help prevent falls. This information is not intended to replace advice given to you by your health care provider. Make sure you discuss any questions you have with your health care provider. Document Released: 05/12/2009 Document Revised: 12/22/2015 Document Reviewed: 08/20/2014 Elsevier Interactive Patient Education  2017 [employer].

## 2021-10-02 NOTE — Progress Notes (Signed)
Subjective:   [name] is a 81 y.o. female who presents for an Initial Medicare Annual Wellness Visit.  Review of Systems     Cardiac Risk Factors include: advanced age (>3men, >62 women);dyslipidemia;hypertension     Objective:    Today's Vitals   10/02/21 1456  BP: 112/64  Pulse: 65  Resp: 16  Temp: 97.7 F (36.5 C)  SpO2: 93%  Weight: 104 lb 1.6 oz (47.2 kg)  Height: 5\' 3"  (1.6 m)   Body mass index is 18.44 kg/m.  Advanced Directives 10/02/2021 07/05/2020 06/29/2020 04/01/2019 07/22/2018 01/16/2017 01/15/2017  Does Patient Have a Medical Advance Directive? Yes No Yes Yes No Yes Yes  Type of Paramedic of [place];Out of facility DNR (pink MOST or yellow form);Living will - [place];Living will [place];Living will - Living will Living will  Does patient want to make changes to medical advance directive? - - - No - Patient declined - No - Patient declined -  Copy of [place] in Chart? No - copy requested - Yes - validated most recent copy scanned in chart (See row information) No - copy requested, Physician notified - - -  Would patient like information on creating a medical advance directive? - No - Patient declined - - No - Patient declined - -  Pre-existing out of facility DNR order (yellow form or pink MOST form) - - - - - - -    Current Medications (verified) Outpatient Encounter Medications as of 10/02/2021  Medication Sig   amLODipine (NORVASC) 10 MG tablet Take 1 tablet (10 mg total) by mouth daily.   aspirin EC 81 MG tablet Take 1 tablet (81 mg total) by mouth 2 (two) times daily at 8 am and 10 pm.   atorvastatin (LIPITOR) 80 MG tablet Take 1 tablet (80 mg total) by mouth daily. Pt needs office visit for further refills   CALCIUM PO Take 1 tablet by mouth daily.   carvedilol (COREG) 25 MG tablet TAKE ONE TABLET BY MOUTH TWICE A DAY WITH A MEAL   COVID-19 mRNA bivalent vaccine,  Pfizer, (PFIZER COVID-19 VAC BIVALENT) injection Inject into the muscle.   furosemide (LASIX) 20 MG tablet TAKE ONE TABLET BY MOUTH EVERY OTHER DAY   hydrALAZINE (APRESOLINE) 25 MG tablet TAKE ONE TABLET BY MOUTH EVERY 8 HOURS   KLOR-CON M20 20 MEQ tablet TAKE ONE TABLET BY MOUTH DAILY   lisinopril (ZESTRIL) 40 MG tablet TAKE ONE TABLET BY MOUTH DAILY   Magnesium Oxide 200 MG TABS Take 200 mg by mouth every morning.    Multiple Vitamin (MULITIVITAMIN WITH MINERALS) TABS Take 1 tablet by mouth daily.   naproxen sodium (ALEVE) 220 MG tablet Take 440 mg by mouth daily as needed (pain).   [DISCONTINUED] doxycycline (VIBRA-TABS) 100 MG tablet Take 1 tablet (100 mg total) by mouth 2 (two) times daily.   Probiotic Product (PROBIOTIC DAILY PO) Take 1 capsule by mouth daily.   rifampin (RIFADIN) 300 MG capsule Take 1 capsule (300 mg total) by mouth 2 (two) times daily.   traMADol (ULTRAM) 50 MG tablet TAKE ONE TO TWO TABLETS BY MOUTH EVERY 12 HOURS AS NEEDED FOR  MODERATE PAIN   [DISCONTINUED] methocarbamol (ROBAXIN) 500 MG tablet Take 1 tablet (500 mg total) by mouth every 6 (six) hours as needed for muscle spasms.   [DISCONTINUED] oxyCODONE (OXY IR/ROXICODONE) 5 MG immediate release tablet Take 1 tablet (5 mg total) by mouth every  4 (four) hours as needed for moderate pain (pain score 4-6).   No facility-administered encounter medications on file as of 10/02/2021.    Allergies (verified) Patient has no known allergies.   History: Past Medical History:  Diagnosis Date   Arthritis    CAD (coronary artery disease)    a. 02/2007 s/p PCI/DES ot LCX and RCA;  b. 07/2014 MV: no ischemia/infarct, EF 68%.   Carotid arterial disease ([place])    a. 05/2015 Carotid U/S: RICA 2-35%, LICA 36-14%, RECA 4-31%, LECA >50% - overall stable, f/u 1 yr.   Complication of anesthesia    Three days after procedure pt statrted to suffer with memoy loss that took about 4-5 days to come back according to [name]."    Dilated cardiomyopathy ([place])    a. 05/2005 Echo: EF 65%; b. 01/22/2016 Echo: EF 25-30% (in setting of admission for urosepsis w/ elev trop); c. 01/26/2016 Echo: EF 35-40%, inf, distal apical, apical, and mid to distal septal HK-->felt to be 2/2 stress induced CM.   Heart murmur    mild AS 04/2020   History of bronchitis    Hyperlipidemia    Hypertension    Hypertensive heart disease    Memory impairment    MRSA (methicillin resistant staph aureus) culture positive    Nephrolithiasis    a. 12/2015 UVJ stone w/ hydroureteronephrosis req nephrostomy tube placement.   Osteoarthritis    a. 11/4006 s/p L TKA complicated by infections req skin grafting.   PVD (peripheral vascular disease) ([place])    a. Bilat common iliac dzs, nl ABI's --> med rx.   Sepsis ([place])    a. 12/2015 admitted with Proteus mirabilis urosepsis/bacteremia.   Wears glasses    Past Surgical History:  Procedure Laterality Date   ABDOMINAL HYSTERECTOMY     APPLICATION OF A-CELL OF EXTREMITY Left 12/08/2015   Procedure: APPLICATION OF A-CELL OF knee;  Surgeon: Loel Lofty [name], DO;  Location: [facility];  Service: Plastics;  Laterality: Left;   APPLICATION OF WOUND VAC Left 11/16/2015   Procedure: APPLICATION OF WOUND VAC;  Surgeon: Loel Lofty [name], DO;  Location: [facility];  Service: Plastics;  Laterality: Left;   APPLICATION OF WOUND VAC Left 12/08/2015   Procedure: APPLICATION OF WOUND VAC  AND CAST to knee;  Surgeon: Loel Lofty [name], DO;  Location: [facility];  Service: Plastics;  Laterality: Left;   COLONOSCOPY     CORONARY ANGIOPLASTY  07   3 stents placed   ESOPHAGOGASTRODUODENOSCOPY     EYE SURGERY Bilateral    cataracts   FREE FLAP TO EXTREMITY Left 11/16/2015   Procedure: FREE FLAP TO EXTREMITY;  Surgeon: Loel Lofty [name], DO;  Location: [facility];  Service: Plastics;  Laterality: Left;   I & D EXTREMITY Left 10/18/2015   Procedure: Left Knee Washout, Reclosure;  Surgeon: Newt Minion, MD;  Location: [facility];  Service:  Orthopedics;  Laterality: Left;   I & D EXTREMITY Left 12/08/2015   Procedure: IRRIGATION AND DEBRIDEMENT knee;  Surgeon: Loel Lofty [name], DO;  Location: [facility];  Service: Plastics;  Laterality: Left;   I & D KNEE WITH POLY EXCHANGE Left 10/12/2015   Procedure: Irrigation and Debridement , Poly Exchange, Antibiotic Beads Left Knee;  Surgeon: Newt Minion, MD;  Location: [facility];  Service: Orthopedics;  Laterality: Left;   INCISION AND DRAINAGE OF WOUND Left 01/04/2016   Procedure: DEBRIDEMENT OF LEFT KNEE WOUND WITH ACELL AND WOUND VAC PLACEMENT;  Surgeon: Lyndee Leo  S [name], DO;  Location: [facility];  Service: Plastics;  Laterality: Left;   IR GENERIC HISTORICAL  05/11/2016   IR FLUORO GUIDE CV MIDLINE PICC RIGHT 05/11/2016 Sandi Mariscal, MD [facility]   IR GENERIC HISTORICAL  05/11/2016   IR US GUIDE VASC ACCESS RIGHT 05/11/2016 Sandi Mariscal, MD [facility]   KNEE CLOSED REDUCTION Left 01/16/2017   Procedure: CLOSED MANIPULATION LEFT KNEE;  Surgeon: Leandrew Koyanagi, MD;  Location: [facility];  Service: Orthopedics;  Laterality: Left;   KNEE SURGERY     denies   SKIN SPLIT GRAFT Left 11/16/2015   Procedure: LEFT GASTROC MUSCLE FLAP WITH SKIN GRAFT SPLIT THICKNESS AND VAC PLACEMENT;  Surgeon: Loel Lofty [name], DO;  Location: [facility];  Service: Plastics;  Laterality: Left;   TOTAL HIP ARTHROPLASTY Right 09/10/2012   Dr Sharol Given   TOTAL HIP ARTHROPLASTY Right 09/10/2012   Procedure: TOTAL HIP ARTHROPLASTY;  Surgeon: Newt Minion, MD;  Location: [facility];  Service: Orthopedics;  Laterality: Right;  Right Total Hip Arthroplasty   TOTAL HIP ARTHROPLASTY Left 06/27/2016   Procedure: LEFT TOTAL HIP ARTHROPLASTY ANTERIOR APPROACH;  Surgeon: Leandrew Koyanagi, MD;  Location: [facility];  Service: Orthopedics;  Laterality: Left;   TOTAL KNEE ARTHROPLASTY Left 09/28/2015   Procedure: TOTAL KNEE ARTHROPLASTY;  Surgeon: Newt Minion, MD;  Location: [facility];  Service: Orthopedics;  Laterality: Left;   TOTAL KNEE  ARTHROPLASTY Right 07/05/2020   TOTAL KNEE ARTHROPLASTY Right 07/05/2020   Procedure: RIGHT TOTAL KNEE ARTHROPLASTY;  Surgeon: Mcarthur Rossetti, MD;  Location: [facility];  Service: Orthopedics;  Laterality: Right;   Family History  Problem Relation Age of Onset   Breast cancer Mother    Leukemia Father    Stroke Sister    Bone cancer Other    Social History   Socioeconomic History   Marital status: Single    Spouse name: Not on file   Number of children: Not on file   Years of education: Not on file   Highest education level: Not on file  Occupational History   Occupation: Retired  Tobacco Use   Smoking status: Former    Packs/day: 0.50    Years: 50.00    Pack years: 25.00    Types: Cigarettes    Quit date: 09/28/2015    Years since quitting: 6.0   Smokeless tobacco: Never   Tobacco comments:    quit 09/27/15  Vaping Use   Vaping Use: Never used  Substance and Sexual Activity   Alcohol use: No   Drug use: No   Sexual activity: Not Currently  Other Topics Concern   Not on file  Social History Narrative   She has been living with her daughter when not in rehab since the surgery in March.   Social Determinants of Health   Financial Resource Strain: Not on file  Food Insecurity: Not on file  Transportation Needs: Not on file  Physical Activity: Not on file  Stress: Not on file  Social Connections: Not on file    Tobacco Counseling Counseling given: Not Answered Tobacco comments: quit 09/27/15   Clinical Intake:  Pre-visit preparation completed: Yes  Pain : No/denies pain     Nutritional Risks: Unintentional weight loss Diabetes: No  How often do you need to have someone help you when you read instructions, pamphlets, or other written materials from your doctor or pharmacy?: 1 - Never  Diabetic?No  Interpreter Needed?: No  Information entered by ::  Lagina Reader   Activities of Daily Living In your present state of health, do you have any  difficulty performing the following activities: 10/02/2021  Hearing? N  Vision? N  Difficulty concentrating or making decisions? N  Walking or climbing stairs? Y  Dressing or bathing? N  Doing errands, shopping? Y  Preparing Food and eating ? N  Using the Toilet? N  In the past six months, have you accidently leaked urine? Y  Do you have problems with loss of bowel control? Y  Managing your Medications? N  Managing your Finances? N  Housekeeping or managing your Housekeeping? N  Some recent data might be hidden    Patient Care Team: Copland, Gay Filler, MD as PCP - General (Family Medicine) [name] Denice Bors, MD as PCP - Cardiology (Cardiology)  Indicate any recent Medical Services you may have received from other than [facility] providers in the past year (date may be approximate).     Assessment:   This is a routine wellness examination for [name].  Hearing/Vision screen No results found.  Dietary issues and exercise activities discussed: Current Exercise Habits: Home exercise routine, Type of exercise: stretching, Time (Minutes): 60, Frequency (Times/Week): 2, Weekly Exercise (Minutes/Week): 120, Intensity: Mild, Exercise limited by: None identified   Goals Addressed   None    Depression Screen PHQ 2/9 Scores 10/02/2021 06/12/2021 04/02/2018 06/26/2017 03/07/2017 02/12/2017 11/06/2016  PHQ - 2 Score 1 0 1 0 0 0 0  PHQ- 9 Score 3 0 - - - - -    Fall Risk Fall Risk  10/02/2021 06/12/2021 04/02/2018 06/26/2017 03/07/2017  Falls in the past year? 1 0 No Yes Yes  Number falls in past yr: 0 0 - 1 2 or more  Comment - - - - -  Injury with Fall? 0 0 - Yes Yes  Comment - - - - -  Risk Factor Category  - - - - -  Risk for fall due to : History of fall(s) - - - -  Follow up Falls evaluation completed - - - -    FALL RISK PREVENTION PERTAINING TO THE HOME:  Any stairs in or around the home? No  If so, are there any without handrails? Yes  Home free of loose throw rugs in walkways, pet beds,  electrical cords, etc? Yes  Adequate lighting in your home to reduce risk of falls? Yes   ASSISTIVE DEVICES UTILIZED TO PREVENT FALLS:  Life alert? Yes  Use of a cane, walker or w/c? Yes  Grab bars in the bathroom? Yes  Shower chair or bench in shower? Yes  Elevated toilet seat or a handicapped toilet? Yes   TIMED UP AND GO:  Was the test performed? No , in wheelchair   Cognitive Function:        Immunizations Immunization History  Administered Date(s) Administered   Fluad Quad(high Dose 65+) 05/19/2020, 06/12/2021   Influenza, High Dose Seasonal PF 05/30/2017   Influenza,inj,Quad PF,6+ Mos 04/29/2018   Influenza-Unspecified 03/28/2016   PFIZER(Purple Top)SARS-COV-2 Vaccination 08/08/2019, 08/29/2019, 05/20/2020   PNEUMOCOCCAL CONJUGATE-20 06/12/2021   Pfizer Covid-19 Vaccine Bivalent Booster 67yrs & up 06/12/2021   Pneumococcal Polysaccharide-23 05/30/2017   Pneumococcal-Unspecified 07/31/2015   Td 01/08/2019    TDAP status: Up to date  Flu Vaccine status: Up to date  Pneumococcal vaccine status: Up to date  Covid: completed  Qualifies for Shingles Vaccine? Yes   Zostavax completed No   Shingrix Completed?: No.    Education has been provided  regarding the importance of this vaccine. Patient has been advised to call insurance company to determine out of pocket expense if they have not yet received this vaccine. Advised may also receive vaccine at local pharmacy or Health Dept. Verbalized acceptance and understanding.  Screening Tests Health Maintenance  Topic Date Due   Zoster Vaccines- Shingrix (1 of 2) Never done   TETANUS/TDAP  01/07/2029   Pneumonia Vaccine 9+ Years old  Completed   INFLUENZA VACCINE  Completed   DEXA SCAN  Completed   COVID-19 Vaccine  Completed   HPV VACCINES  Aged Out    Health Maintenance  Health Maintenance Due  Topic Date Due   Zoster Vaccines- Shingrix (1 of 2) Never done    Colorectal cancer screening: No longer  required.   Mammogram status: Ordered 10/02/21. Pt provided with contact info and advised to call to schedule appt.   Bone Density status: Ordered 10/02/21. Pt provided with contact info and advised to call to schedule appt.  Lung Cancer Screening: (Low Dose CT Chest recommended if Age 11-80 years, 30 pack-year currently smoking OR have quit w/in 15years.) does not qualify.   Lung Cancer Screening Referral: N/A  Additional Screening:  Hepatitis C Screening: does not qualify; Completed N/A  Vision Screening: Recommended annual ophthalmology exams for early detection of glaucoma and other disorders of the eye. Is the patient up to date with their annual eye exam?  No  Who is the provider or what is the name of the office in which the patient attends annual eye exams? N/A If pt is not established with a provider, would they like to be referred to a provider to establish care? No .   Dental Screening: Recommended annual dental exams for proper oral hygiene  Community Resource Referral / Chronic Care Management: CRR required this visit?  No   CCM required this visit?  No      Plan:     I have personally reviewed and noted the following in the patients chart:   Medical and social history Use of alcohol, tobacco or illicit drugs  Current medications and supplements including opioid prescriptions. Patient is currently taking opioid prescriptions. Information provided to patient regarding non-opioid alternatives. Patient advised to discuss non-opioid treatment plan with their provider. Functional ability and status Nutritional status Physical activity Advanced directives List of other physicians Hospitalizations, surgeries, and ER visits in previous 12 months Vitals Screenings to include cognitive, depression, and falls Referrals and appointments  In addition, I have reviewed and discussed with patient certain preventive protocols, quality metrics, and best practice recommendations.  A written personalized care plan for preventive services as well as general preventive health recommendations were provided to patient.     Duard Brady Erick Murin, CMA   10/02/2021   Nurse Notes: None

## 2021-10-03 DIAGNOSIS — M545 Low back pain, unspecified: Secondary | ICD-10-CM | POA: Diagnosis not present

## 2021-10-05 DIAGNOSIS — M545 Low back pain, unspecified: Secondary | ICD-10-CM | POA: Diagnosis not present

## 2021-10-06 ENCOUNTER — Telehealth: Payer: Self-pay

## 2021-10-06 NOTE — Telephone Encounter (Signed)
Patient called and would like a refill on her tramadol  ?

## 2021-10-08 ENCOUNTER — Telehealth: Payer: Self-pay | Admitting: *Deleted

## 2021-10-08 NOTE — Telephone Encounter (Signed)
1 year Ortho bundle call completed. ?

## 2021-10-09 ENCOUNTER — Ambulatory Visit
Admission: RE | Admit: 2021-10-09 | Discharge: 2021-10-09 | Disposition: A | Discharge status: Home / Self Care | Payer: Medicare Other | Source: Ambulatory Visit | Attending: Physician Assistant | Admitting: Physician Assistant

## 2021-10-09 ENCOUNTER — Other Ambulatory Visit: Payer: Self-pay | Admitting: Physician Assistant

## 2021-10-09 ENCOUNTER — Other Ambulatory Visit: Payer: Self-pay

## 2021-10-09 DIAGNOSIS — G8929 Other chronic pain: Secondary | ICD-10-CM

## 2021-10-09 DIAGNOSIS — M5442 Lumbago with sciatica, left side: Secondary | ICD-10-CM

## 2021-10-09 DIAGNOSIS — M545 Low back pain, unspecified: Secondary | ICD-10-CM | POA: Diagnosis not present

## 2021-10-09 MED ORDER — TRAMADOL HCL 50 MG PO TABS: ORAL_TABLET | ORAL | 0 refills | Status: DC

## 2021-10-10 ENCOUNTER — Other Ambulatory Visit: Payer: Self-pay

## 2021-10-10 ENCOUNTER — Encounter: Payer: Self-pay | Admitting: Family Medicine

## 2021-10-10 ENCOUNTER — Ambulatory Visit (HOSPITAL_BASED_OUTPATIENT_CLINIC_OR_DEPARTMENT_OTHER)
Admission: RE | Admit: 2021-10-10 | Discharge: 2021-10-10 | Disposition: A | Discharge status: Home / Self Care | Payer: Medicare Other | Source: Ambulatory Visit | Attending: Family Medicine | Admitting: Family Medicine

## 2021-10-10 ENCOUNTER — Telehealth: Payer: Self-pay | Admitting: Physician Assistant

## 2021-10-10 ENCOUNTER — Encounter (HOSPITAL_BASED_OUTPATIENT_CLINIC_OR_DEPARTMENT_OTHER): Payer: Self-pay

## 2021-10-10 DIAGNOSIS — R2241 Localized swelling, mass and lump, right lower limb: Secondary | ICD-10-CM

## 2021-10-10 DIAGNOSIS — M25551 Pain in right hip: Secondary | ICD-10-CM

## 2021-10-10 DIAGNOSIS — Z780 Asymptomatic menopausal state: Secondary | ICD-10-CM

## 2021-10-10 DIAGNOSIS — Z1231 Encounter for screening mammogram for malignant neoplasm of breast: Secondary | ICD-10-CM | POA: Diagnosis not present

## 2021-10-10 DIAGNOSIS — M810 Age-related osteoporosis without current pathological fracture: Secondary | ICD-10-CM | POA: Diagnosis not present

## 2021-10-10 NOTE — Telephone Encounter (Signed)
Is this the pt you talked to? ?

## 2021-10-10 NOTE — Telephone Encounter (Signed)
Ruby from [facility] called with MRI results. Please call Ruby at [phone] ?

## 2021-10-10 NOTE — Telephone Encounter (Signed)
Called and advised radiology and scan and referral was placed in chart ?

## 2021-10-10 NOTE — Telephone Encounter (Signed)
Scan and referral in chart ?

## 2021-10-11 ENCOUNTER — Other Ambulatory Visit: Payer: Self-pay | Admitting: Family Medicine

## 2021-10-11 DIAGNOSIS — R928 Other abnormal and inconclusive findings on diagnostic imaging of breast: Secondary | ICD-10-CM

## 2021-10-11 DIAGNOSIS — Z20822 Contact with and (suspected) exposure to covid-19: Secondary | ICD-10-CM | POA: Diagnosis not present

## 2021-10-12 ENCOUNTER — Ambulatory Visit: Payer: Medicare Other | Admitting: Physician Assistant

## 2021-10-17 ENCOUNTER — Encounter: Payer: Self-pay | Admitting: *Deleted

## 2021-10-19 DIAGNOSIS — M545 Low back pain, unspecified: Secondary | ICD-10-CM | POA: Diagnosis not present

## 2021-10-24 DIAGNOSIS — M899 Disorder of bone, unspecified: Secondary | ICD-10-CM | POA: Diagnosis not present

## 2021-10-24 DIAGNOSIS — R2241 Localized swelling, mass and lump, right lower limb: Secondary | ICD-10-CM | POA: Diagnosis not present

## 2021-10-24 DIAGNOSIS — Z96651 Presence of right artificial knee joint: Secondary | ICD-10-CM | POA: Diagnosis not present

## 2021-10-24 DIAGNOSIS — Z8781 Personal history of (healed) traumatic fracture: Secondary | ICD-10-CM | POA: Diagnosis not present

## 2021-10-24 DIAGNOSIS — Z471 Aftercare following joint replacement surgery: Secondary | ICD-10-CM | POA: Diagnosis not present

## 2021-10-24 DIAGNOSIS — M8588 Other specified disorders of bone density and structure, other site: Secondary | ICD-10-CM | POA: Diagnosis not present

## 2021-10-24 DIAGNOSIS — N281 Cyst of kidney, acquired: Secondary | ICD-10-CM | POA: Diagnosis not present

## 2021-10-24 DIAGNOSIS — R918 Other nonspecific abnormal finding of lung field: Secondary | ICD-10-CM | POA: Diagnosis not present

## 2021-10-24 DIAGNOSIS — Z792 Long term (current) use of antibiotics: Secondary | ICD-10-CM | POA: Diagnosis not present

## 2021-10-24 DIAGNOSIS — Z96643 Presence of artificial hip joint, bilateral: Secondary | ICD-10-CM | POA: Diagnosis not present

## 2021-10-24 DIAGNOSIS — M545 Low back pain, unspecified: Secondary | ICD-10-CM | POA: Diagnosis not present

## 2021-10-24 DIAGNOSIS — I998 Other disorder of circulatory system: Secondary | ICD-10-CM | POA: Diagnosis not present

## 2021-10-24 DIAGNOSIS — N6342 Unspecified lump in left breast, subareolar: Secondary | ICD-10-CM | POA: Diagnosis not present

## 2021-10-28 ENCOUNTER — Other Ambulatory Visit: Payer: Self-pay | Admitting: Internal Medicine

## 2021-10-28 DIAGNOSIS — Z96659 Presence of unspecified artificial knee joint: Secondary | ICD-10-CM

## 2021-10-30 ENCOUNTER — Other Ambulatory Visit: Payer: Self-pay | Admitting: Orthopaedic Surgery

## 2021-10-30 ENCOUNTER — Telehealth: Payer: Self-pay | Admitting: Orthopaedic Surgery

## 2021-10-30 ENCOUNTER — Ambulatory Visit: Payer: Medicare Other

## 2021-10-30 ENCOUNTER — Ambulatory Visit
Admission: RE | Admit: 2021-10-30 | Discharge: 2021-10-30 | Disposition: A | Discharge status: Home / Self Care | Payer: Medicare Other | Source: Ambulatory Visit | Attending: Family Medicine | Admitting: Family Medicine

## 2021-10-30 DIAGNOSIS — R928 Other abnormal and inconclusive findings on diagnostic imaging of breast: Secondary | ICD-10-CM

## 2021-10-30 DIAGNOSIS — R922 Inconclusive mammogram: Secondary | ICD-10-CM | POA: Diagnosis not present

## 2021-10-30 DIAGNOSIS — G8929 Other chronic pain: Secondary | ICD-10-CM

## 2021-10-30 MED ORDER — TRAMADOL HCL 50 MG PO TABS
ORAL_TABLET | ORAL | 0 refills | Status: DC
Start: 2021-10-30 — End: 2021-11-07

## 2021-10-30 NOTE — Telephone Encounter (Signed)
Please advise 

## 2021-10-30 NOTE — Telephone Encounter (Signed)
Pt is calling to get a refill on Tramadol   ?

## 2021-10-31 ENCOUNTER — Other Ambulatory Visit: Payer: Self-pay

## 2021-10-31 ENCOUNTER — Encounter: Payer: Self-pay | Admitting: Internal Medicine

## 2021-10-31 DIAGNOSIS — T8459XD Infection and inflammatory reaction due to other internal joint prosthesis, subsequent encounter: Secondary | ICD-10-CM

## 2021-10-31 MED ORDER — RIFAMPIN 300 MG PO CAPS: 300.0000 mg | ORAL_CAPSULE | Freq: Two times a day (BID) | ORAL | 1 refills | Status: AC

## 2021-11-06 DIAGNOSIS — R2241 Localized swelling, mass and lump, right lower limb: Secondary | ICD-10-CM | POA: Diagnosis not present

## 2021-11-06 DIAGNOSIS — Z171 Estrogen receptor negative status [ER-]: Secondary | ICD-10-CM | POA: Diagnosis not present

## 2021-11-06 DIAGNOSIS — C414 Malignant neoplasm of pelvic bones, sacrum and coccyx: Secondary | ICD-10-CM | POA: Diagnosis not present

## 2021-11-06 DIAGNOSIS — C801 Malignant (primary) neoplasm, unspecified: Secondary | ICD-10-CM | POA: Diagnosis not present

## 2021-11-06 DIAGNOSIS — C7951 Secondary malignant neoplasm of bone: Secondary | ICD-10-CM | POA: Diagnosis not present

## 2021-11-07 ENCOUNTER — Telehealth: Payer: Self-pay | Admitting: Orthopaedic Surgery

## 2021-11-07 ENCOUNTER — Other Ambulatory Visit: Payer: Self-pay | Admitting: Physician Assistant

## 2021-11-07 DIAGNOSIS — G8929 Other chronic pain: Secondary | ICD-10-CM

## 2021-11-07 MED ORDER — TRAMADOL HCL 50 MG PO TABS: ORAL_TABLET | ORAL | 0 refills | Status: AC

## 2021-11-07 NOTE — Telephone Encounter (Signed)
Refill on tramadol

## 2021-11-13 DIAGNOSIS — M899 Disorder of bone, unspecified: Secondary | ICD-10-CM | POA: Diagnosis not present

## 2021-11-13 DIAGNOSIS — I7389 Other specified peripheral vascular diseases: Secondary | ICD-10-CM | POA: Diagnosis not present

## 2021-11-13 DIAGNOSIS — G9389 Other specified disorders of brain: Secondary | ICD-10-CM | POA: Diagnosis not present

## 2021-11-13 DIAGNOSIS — Z8673 Personal history of transient ischemic attack (TIA), and cerebral infarction without residual deficits: Secondary | ICD-10-CM | POA: Diagnosis not present

## 2021-11-14 DIAGNOSIS — M898X9 Other specified disorders of bone, unspecified site: Secondary | ICD-10-CM | POA: Diagnosis not present

## 2021-11-14 DIAGNOSIS — S32512A Fracture of superior rim of left pubis, initial encounter for closed fracture: Secondary | ICD-10-CM | POA: Diagnosis not present

## 2021-11-14 DIAGNOSIS — Z79899 Other long term (current) drug therapy: Secondary | ICD-10-CM | POA: Diagnosis not present

## 2021-11-14 DIAGNOSIS — S32511A Fracture of superior rim of right pubis, initial encounter for closed fracture: Secondary | ICD-10-CM | POA: Diagnosis not present

## 2021-11-14 DIAGNOSIS — K8020 Calculus of gallbladder without cholecystitis without obstruction: Secondary | ICD-10-CM | POA: Diagnosis not present

## 2021-11-14 DIAGNOSIS — J439 Emphysema, unspecified: Secondary | ICD-10-CM | POA: Diagnosis not present

## 2021-11-16 DIAGNOSIS — C7952 Secondary malignant neoplasm of bone marrow: Secondary | ICD-10-CM | POA: Diagnosis not present

## 2021-11-16 DIAGNOSIS — C3490 Malignant neoplasm of unspecified part of unspecified bronchus or lung: Secondary | ICD-10-CM | POA: Diagnosis not present

## 2021-11-16 DIAGNOSIS — Z87891 Personal history of nicotine dependence: Secondary | ICD-10-CM | POA: Diagnosis not present

## 2021-11-16 DIAGNOSIS — C7951 Secondary malignant neoplasm of bone: Secondary | ICD-10-CM | POA: Diagnosis not present

## 2021-11-16 DIAGNOSIS — M899 Disorder of bone, unspecified: Secondary | ICD-10-CM | POA: Diagnosis not present

## 2021-11-21 DIAGNOSIS — C414 Malignant neoplasm of pelvic bones, sacrum and coccyx: Secondary | ICD-10-CM | POA: Diagnosis not present

## 2021-11-22 DIAGNOSIS — R2689 Other abnormalities of gait and mobility: Secondary | ICD-10-CM | POA: Diagnosis not present

## 2021-11-22 DIAGNOSIS — I69828 Other speech and language deficits following other cerebrovascular disease: Secondary | ICD-10-CM | POA: Diagnosis not present

## 2021-11-22 DIAGNOSIS — C7952 Secondary malignant neoplasm of bone marrow: Secondary | ICD-10-CM | POA: Diagnosis present

## 2021-11-22 DIAGNOSIS — R410 Disorientation, unspecified: Secondary | ICD-10-CM | POA: Diagnosis not present

## 2021-11-22 DIAGNOSIS — Z955 Presence of coronary angioplasty implant and graft: Secondary | ICD-10-CM | POA: Diagnosis not present

## 2021-11-22 DIAGNOSIS — R52 Pain, unspecified: Secondary | ICD-10-CM | POA: Diagnosis not present

## 2021-11-22 DIAGNOSIS — G893 Neoplasm related pain (acute) (chronic): Secondary | ICD-10-CM | POA: Diagnosis present

## 2021-11-22 DIAGNOSIS — Z743 Need for continuous supervision: Secondary | ICD-10-CM | POA: Diagnosis not present

## 2021-11-22 DIAGNOSIS — C801 Malignant (primary) neoplasm, unspecified: Secondary | ICD-10-CM | POA: Diagnosis not present

## 2021-11-22 DIAGNOSIS — D649 Anemia, unspecified: Secondary | ICD-10-CM | POA: Diagnosis present

## 2021-11-22 DIAGNOSIS — C799 Secondary malignant neoplasm of unspecified site: Secondary | ICD-10-CM | POA: Diagnosis not present

## 2021-11-22 DIAGNOSIS — Z7982 Long term (current) use of aspirin: Secondary | ICD-10-CM | POA: Diagnosis not present

## 2021-11-22 DIAGNOSIS — Z87891 Personal history of nicotine dependence: Secondary | ICD-10-CM | POA: Diagnosis not present

## 2021-11-22 DIAGNOSIS — R110 Nausea: Secondary | ICD-10-CM | POA: Diagnosis not present

## 2021-11-22 DIAGNOSIS — B9562 Methicillin resistant Staphylococcus aureus infection as the cause of diseases classified elsewhere: Secondary | ICD-10-CM | POA: Diagnosis not present

## 2021-11-22 DIAGNOSIS — R197 Diarrhea, unspecified: Secondary | ICD-10-CM | POA: Diagnosis not present

## 2021-11-22 DIAGNOSIS — Z681 Body mass index (BMI) 19 or less, adult: Secondary | ICD-10-CM | POA: Diagnosis not present

## 2021-11-22 DIAGNOSIS — I959 Hypotension, unspecified: Secondary | ICD-10-CM | POA: Diagnosis not present

## 2021-11-22 DIAGNOSIS — M25551 Pain in right hip: Secondary | ICD-10-CM | POA: Diagnosis not present

## 2021-11-22 DIAGNOSIS — I2510 Atherosclerotic heart disease of native coronary artery without angina pectoris: Secondary | ICD-10-CM | POA: Diagnosis present

## 2021-11-22 DIAGNOSIS — M1990 Unspecified osteoarthritis, unspecified site: Secondary | ICD-10-CM | POA: Diagnosis not present

## 2021-11-22 DIAGNOSIS — R279 Unspecified lack of coordination: Secondary | ICD-10-CM | POA: Diagnosis not present

## 2021-11-22 DIAGNOSIS — Z66 Do not resuscitate: Secondary | ICD-10-CM | POA: Diagnosis not present

## 2021-11-22 DIAGNOSIS — R1310 Dysphagia, unspecified: Secondary | ICD-10-CM | POA: Diagnosis not present

## 2021-11-22 DIAGNOSIS — Z79899 Other long term (current) drug therapy: Secondary | ICD-10-CM | POA: Diagnosis not present

## 2021-11-22 DIAGNOSIS — R531 Weakness: Secondary | ICD-10-CM | POA: Diagnosis not present

## 2021-11-22 DIAGNOSIS — E860 Dehydration: Secondary | ICD-10-CM | POA: Diagnosis present

## 2021-11-22 DIAGNOSIS — R404 Transient alteration of awareness: Secondary | ICD-10-CM | POA: Diagnosis not present

## 2021-11-22 DIAGNOSIS — K529 Noninfective gastroenteritis and colitis, unspecified: Secondary | ICD-10-CM | POA: Diagnosis present

## 2021-11-22 DIAGNOSIS — M25572 Pain in left ankle and joints of left foot: Secondary | ICD-10-CM | POA: Diagnosis not present

## 2021-11-22 DIAGNOSIS — R262 Difficulty in walking, not elsewhere classified: Secondary | ICD-10-CM | POA: Diagnosis not present

## 2021-11-22 DIAGNOSIS — I10 Essential (primary) hypertension: Secondary | ICD-10-CM | POA: Diagnosis present

## 2021-11-22 DIAGNOSIS — T8454XD Infection and inflammatory reaction due to internal left knee prosthesis, subsequent encounter: Secondary | ICD-10-CM | POA: Diagnosis not present

## 2021-11-22 DIAGNOSIS — C7951 Secondary malignant neoplasm of bone: Secondary | ICD-10-CM | POA: Diagnosis present

## 2021-11-22 DIAGNOSIS — R978 Other abnormal tumor markers: Secondary | ICD-10-CM | POA: Diagnosis not present

## 2021-11-22 DIAGNOSIS — R112 Nausea with vomiting, unspecified: Secondary | ICD-10-CM | POA: Diagnosis not present

## 2021-11-22 DIAGNOSIS — M6281 Muscle weakness (generalized): Secondary | ICD-10-CM | POA: Diagnosis not present

## 2021-11-22 DIAGNOSIS — R636 Underweight: Secondary | ICD-10-CM | POA: Diagnosis present

## 2021-11-22 DIAGNOSIS — E785 Hyperlipidemia, unspecified: Secondary | ICD-10-CM | POA: Diagnosis not present

## 2021-11-22 DIAGNOSIS — M899 Disorder of bone, unspecified: Secondary | ICD-10-CM | POA: Diagnosis not present

## 2021-11-22 DIAGNOSIS — R5383 Other fatigue: Secondary | ICD-10-CM | POA: Diagnosis not present

## 2021-11-22 DIAGNOSIS — R1111 Vomiting without nausea: Secondary | ICD-10-CM | POA: Diagnosis not present

## 2021-11-22 DIAGNOSIS — Z20822 Contact with and (suspected) exposure to covid-19: Secondary | ICD-10-CM | POA: Diagnosis not present

## 2021-11-23 DIAGNOSIS — C7951 Secondary malignant neoplasm of bone: Secondary | ICD-10-CM | POA: Diagnosis not present

## 2021-11-23 DIAGNOSIS — C7952 Secondary malignant neoplasm of bone marrow: Secondary | ICD-10-CM | POA: Diagnosis not present

## 2021-11-23 DIAGNOSIS — R978 Other abnormal tumor markers: Secondary | ICD-10-CM | POA: Diagnosis not present

## 2021-11-23 DIAGNOSIS — G893 Neoplasm related pain (acute) (chronic): Secondary | ICD-10-CM | POA: Diagnosis not present

## 2021-11-23 DIAGNOSIS — M899 Disorder of bone, unspecified: Secondary | ICD-10-CM | POA: Diagnosis not present

## 2021-11-24 ENCOUNTER — Telehealth: Payer: Self-pay

## 2021-11-24 NOTE — Telephone Encounter (Signed)
Spoke with patient's son regarding scheduling a consult. He states patient was recently diagnosed with bone cancer. She had a 25 lb weight loss,  sleeping more, deceased appetite, extreme pain and will start radiation on Monday. Offered a Palliative Care consult for next week. He states they cannot wait that long. He said they were going to try to get her in a facility this weekend unsure if that is going to happen over the weekend, but states he and his wife can not keep staying with the patient. Sounds she may be hospice eligible but starts radiation next week.  ? ?

## 2021-11-25 DIAGNOSIS — Z681 Body mass index (BMI) 19 or less, adult: Secondary | ICD-10-CM | POA: Diagnosis not present

## 2021-11-25 DIAGNOSIS — R531 Weakness: Secondary | ICD-10-CM | POA: Diagnosis not present

## 2021-11-25 DIAGNOSIS — T8454XD Infection and inflammatory reaction due to internal left knee prosthesis, subsequent encounter: Secondary | ICD-10-CM | POA: Diagnosis not present

## 2021-11-25 DIAGNOSIS — R410 Disorientation, unspecified: Secondary | ICD-10-CM | POA: Diagnosis not present

## 2021-11-25 DIAGNOSIS — E860 Dehydration: Secondary | ICD-10-CM | POA: Diagnosis present

## 2021-11-25 DIAGNOSIS — Z743 Need for continuous supervision: Secondary | ICD-10-CM | POA: Diagnosis not present

## 2021-11-25 DIAGNOSIS — K529 Noninfective gastroenteritis and colitis, unspecified: Secondary | ICD-10-CM | POA: Diagnosis present

## 2021-11-25 DIAGNOSIS — D649 Anemia, unspecified: Secondary | ICD-10-CM | POA: Diagnosis present

## 2021-11-25 DIAGNOSIS — Z79899 Other long term (current) drug therapy: Secondary | ICD-10-CM | POA: Diagnosis not present

## 2021-11-25 DIAGNOSIS — M1990 Unspecified osteoarthritis, unspecified site: Secondary | ICD-10-CM | POA: Diagnosis not present

## 2021-11-25 DIAGNOSIS — R279 Unspecified lack of coordination: Secondary | ICD-10-CM | POA: Diagnosis not present

## 2021-11-25 DIAGNOSIS — C7952 Secondary malignant neoplasm of bone marrow: Secondary | ICD-10-CM | POA: Diagnosis present

## 2021-11-25 DIAGNOSIS — I69828 Other speech and language deficits following other cerebrovascular disease: Secondary | ICD-10-CM | POA: Diagnosis not present

## 2021-11-25 DIAGNOSIS — Z66 Do not resuscitate: Secondary | ICD-10-CM | POA: Diagnosis not present

## 2021-11-25 DIAGNOSIS — C7951 Secondary malignant neoplasm of bone: Secondary | ICD-10-CM | POA: Diagnosis present

## 2021-11-25 DIAGNOSIS — G893 Neoplasm related pain (acute) (chronic): Secondary | ICD-10-CM | POA: Diagnosis present

## 2021-11-25 DIAGNOSIS — R5383 Other fatigue: Secondary | ICD-10-CM | POA: Diagnosis not present

## 2021-11-25 DIAGNOSIS — R262 Difficulty in walking, not elsewhere classified: Secondary | ICD-10-CM | POA: Diagnosis not present

## 2021-11-25 DIAGNOSIS — R110 Nausea: Secondary | ICD-10-CM | POA: Diagnosis not present

## 2021-11-25 DIAGNOSIS — B9562 Methicillin resistant Staphylococcus aureus infection as the cause of diseases classified elsewhere: Secondary | ICD-10-CM | POA: Diagnosis present

## 2021-11-25 DIAGNOSIS — C801 Malignant (primary) neoplasm, unspecified: Secondary | ICD-10-CM | POA: Diagnosis present

## 2021-11-25 DIAGNOSIS — I959 Hypotension, unspecified: Secondary | ICD-10-CM | POA: Diagnosis not present

## 2021-11-25 DIAGNOSIS — Z87891 Personal history of nicotine dependence: Secondary | ICD-10-CM | POA: Diagnosis not present

## 2021-11-25 DIAGNOSIS — Z7982 Long term (current) use of aspirin: Secondary | ICD-10-CM | POA: Diagnosis not present

## 2021-11-25 DIAGNOSIS — R404 Transient alteration of awareness: Secondary | ICD-10-CM | POA: Diagnosis not present

## 2021-11-25 DIAGNOSIS — R2689 Other abnormalities of gait and mobility: Secondary | ICD-10-CM | POA: Diagnosis not present

## 2021-11-25 DIAGNOSIS — R636 Underweight: Secondary | ICD-10-CM | POA: Diagnosis present

## 2021-11-25 DIAGNOSIS — R1111 Vomiting without nausea: Secondary | ICD-10-CM | POA: Diagnosis not present

## 2021-11-25 DIAGNOSIS — E785 Hyperlipidemia, unspecified: Secondary | ICD-10-CM | POA: Diagnosis not present

## 2021-11-25 DIAGNOSIS — Z955 Presence of coronary angioplasty implant and graft: Secondary | ICD-10-CM | POA: Diagnosis not present

## 2021-11-25 DIAGNOSIS — R197 Diarrhea, unspecified: Secondary | ICD-10-CM | POA: Diagnosis not present

## 2021-11-25 DIAGNOSIS — I10 Essential (primary) hypertension: Secondary | ICD-10-CM | POA: Diagnosis present

## 2021-11-25 DIAGNOSIS — Z20822 Contact with and (suspected) exposure to covid-19: Secondary | ICD-10-CM | POA: Diagnosis not present

## 2021-11-25 DIAGNOSIS — R52 Pain, unspecified: Secondary | ICD-10-CM | POA: Diagnosis not present

## 2021-11-25 DIAGNOSIS — I2510 Atherosclerotic heart disease of native coronary artery without angina pectoris: Secondary | ICD-10-CM | POA: Diagnosis present

## 2021-11-25 DIAGNOSIS — C799 Secondary malignant neoplasm of unspecified site: Secondary | ICD-10-CM | POA: Diagnosis not present

## 2021-11-25 DIAGNOSIS — M25551 Pain in right hip: Secondary | ICD-10-CM | POA: Diagnosis not present

## 2021-11-25 DIAGNOSIS — R1310 Dysphagia, unspecified: Secondary | ICD-10-CM | POA: Diagnosis not present

## 2021-11-25 DIAGNOSIS — M6281 Muscle weakness (generalized): Secondary | ICD-10-CM | POA: Diagnosis not present

## 2021-11-25 DIAGNOSIS — R112 Nausea with vomiting, unspecified: Secondary | ICD-10-CM | POA: Diagnosis not present

## 2021-11-25 DIAGNOSIS — M25572 Pain in left ankle and joints of left foot: Secondary | ICD-10-CM | POA: Diagnosis not present

## 2021-11-29 DIAGNOSIS — Z510 Encounter for antineoplastic radiation therapy: Secondary | ICD-10-CM | POA: Diagnosis not present

## 2021-11-29 DIAGNOSIS — R2689 Other abnormalities of gait and mobility: Secondary | ICD-10-CM | POA: Diagnosis not present

## 2021-11-29 DIAGNOSIS — Z20822 Contact with and (suspected) exposure to covid-19: Secondary | ICD-10-CM | POA: Diagnosis not present

## 2021-11-29 DIAGNOSIS — I10 Essential (primary) hypertension: Secondary | ICD-10-CM | POA: Diagnosis not present

## 2021-11-29 DIAGNOSIS — I130 Hypertensive heart and chronic kidney disease with heart failure and stage 1 through stage 4 chronic kidney disease, or unspecified chronic kidney disease: Secondary | ICD-10-CM | POA: Diagnosis not present

## 2021-11-29 DIAGNOSIS — R531 Weakness: Secondary | ICD-10-CM | POA: Diagnosis not present

## 2021-11-29 DIAGNOSIS — R1310 Dysphagia, unspecified: Secondary | ICD-10-CM | POA: Diagnosis not present

## 2021-11-29 DIAGNOSIS — C7951 Secondary malignant neoplasm of bone: Secondary | ICD-10-CM | POA: Diagnosis not present

## 2021-11-29 DIAGNOSIS — E039 Hypothyroidism, unspecified: Secondary | ICD-10-CM | POA: Diagnosis not present

## 2021-11-29 DIAGNOSIS — R404 Transient alteration of awareness: Secondary | ICD-10-CM | POA: Diagnosis not present

## 2021-11-29 DIAGNOSIS — I69828 Other speech and language deficits following other cerebrovascular disease: Secondary | ICD-10-CM | POA: Diagnosis not present

## 2021-11-29 DIAGNOSIS — E785 Hyperlipidemia, unspecified: Secondary | ICD-10-CM | POA: Diagnosis not present

## 2021-11-29 DIAGNOSIS — Z743 Need for continuous supervision: Secondary | ICD-10-CM | POA: Diagnosis not present

## 2021-11-29 DIAGNOSIS — M1990 Unspecified osteoarthritis, unspecified site: Secondary | ICD-10-CM | POA: Diagnosis not present

## 2021-11-29 DIAGNOSIS — R279 Unspecified lack of coordination: Secondary | ICD-10-CM | POA: Diagnosis not present

## 2021-11-29 DIAGNOSIS — C799 Secondary malignant neoplasm of unspecified site: Secondary | ICD-10-CM | POA: Diagnosis not present

## 2021-11-29 DIAGNOSIS — I2510 Atherosclerotic heart disease of native coronary artery without angina pectoris: Secondary | ICD-10-CM | POA: Diagnosis not present

## 2021-11-29 DIAGNOSIS — M6281 Muscle weakness (generalized): Secondary | ICD-10-CM | POA: Diagnosis not present

## 2021-11-29 DIAGNOSIS — C7952 Secondary malignant neoplasm of bone marrow: Secondary | ICD-10-CM | POA: Diagnosis not present

## 2021-11-30 DIAGNOSIS — E785 Hyperlipidemia, unspecified: Secondary | ICD-10-CM | POA: Diagnosis not present

## 2021-11-30 DIAGNOSIS — I2510 Atherosclerotic heart disease of native coronary artery without angina pectoris: Secondary | ICD-10-CM | POA: Diagnosis not present

## 2021-11-30 DIAGNOSIS — Z510 Encounter for antineoplastic radiation therapy: Secondary | ICD-10-CM | POA: Diagnosis not present

## 2021-11-30 DIAGNOSIS — C7951 Secondary malignant neoplasm of bone: Secondary | ICD-10-CM | POA: Diagnosis not present

## 2021-11-30 DIAGNOSIS — E039 Hypothyroidism, unspecified: Secondary | ICD-10-CM | POA: Diagnosis not present

## 2021-11-30 DIAGNOSIS — I130 Hypertensive heart and chronic kidney disease with heart failure and stage 1 through stage 4 chronic kidney disease, or unspecified chronic kidney disease: Secondary | ICD-10-CM | POA: Diagnosis not present

## 2021-12-04 DIAGNOSIS — M6281 Muscle weakness (generalized): Secondary | ICD-10-CM | POA: Diagnosis not present

## 2021-12-04 DIAGNOSIS — I10 Essential (primary) hypertension: Secondary | ICD-10-CM | POA: Diagnosis not present

## 2021-12-04 DIAGNOSIS — I2510 Atherosclerotic heart disease of native coronary artery without angina pectoris: Secondary | ICD-10-CM | POA: Diagnosis not present

## 2021-12-04 DIAGNOSIS — Z510 Encounter for antineoplastic radiation therapy: Secondary | ICD-10-CM | POA: Diagnosis not present

## 2021-12-04 DIAGNOSIS — C799 Secondary malignant neoplasm of unspecified site: Secondary | ICD-10-CM | POA: Diagnosis not present

## 2021-12-04 DIAGNOSIS — C7951 Secondary malignant neoplasm of bone: Secondary | ICD-10-CM | POA: Diagnosis not present

## 2021-12-05 ENCOUNTER — Ambulatory Visit: Payer: Medicare Other | Admitting: Internal Medicine

## 2021-12-06 ENCOUNTER — Other Ambulatory Visit: Payer: Self-pay | Admitting: *Deleted

## 2021-12-06 DIAGNOSIS — Z20822 Contact with and (suspected) exposure to covid-19: Secondary | ICD-10-CM | POA: Diagnosis not present

## 2021-12-28 NOTE — Patient Outreach (Signed)
Per [place] eligible member currently resides in [facility].  Screened for potential [facility] care coordination services as a benefit of [employer] plan. ? ?Mr. Olano admitted to SNF on 11/29/21 after hospitalization. ? ?Member's PCP at [facility] has [facility] care coordination team. ? ?Facility site visit to [facility] skilled nursing facility. Met with Marita [place] concerning member's transition plan and potential [facility] needs. Anticipated transition plan is to return home alone. Has supportive sons.  ? ?Went to speak with Ms. Mancel Bale. However she was off the unit with therapy.  ? ?Will continue to follow while Ms. Rebman resides in SNF.  ? ? ? ?Marthenia Rolling, MSN, RN,BSN ?[place] Coordinator ?[phone] [facility]) ?[phone]  (Toll free office)  ?

## 2021-12-28 DEATH — deceased

## 2022-09-29 IMAGING — DX DG HAND COMPLETE 3+V*L*
3 series · 3 of 3 positions shown · non-contrast
Comparison: None.

CLINICAL DATA: Chronic left hand pain for several years, no known
injury, initial encounter

EXAM:
LEFT HAND - COMPLETE 3+ VIEW

[hand pa]
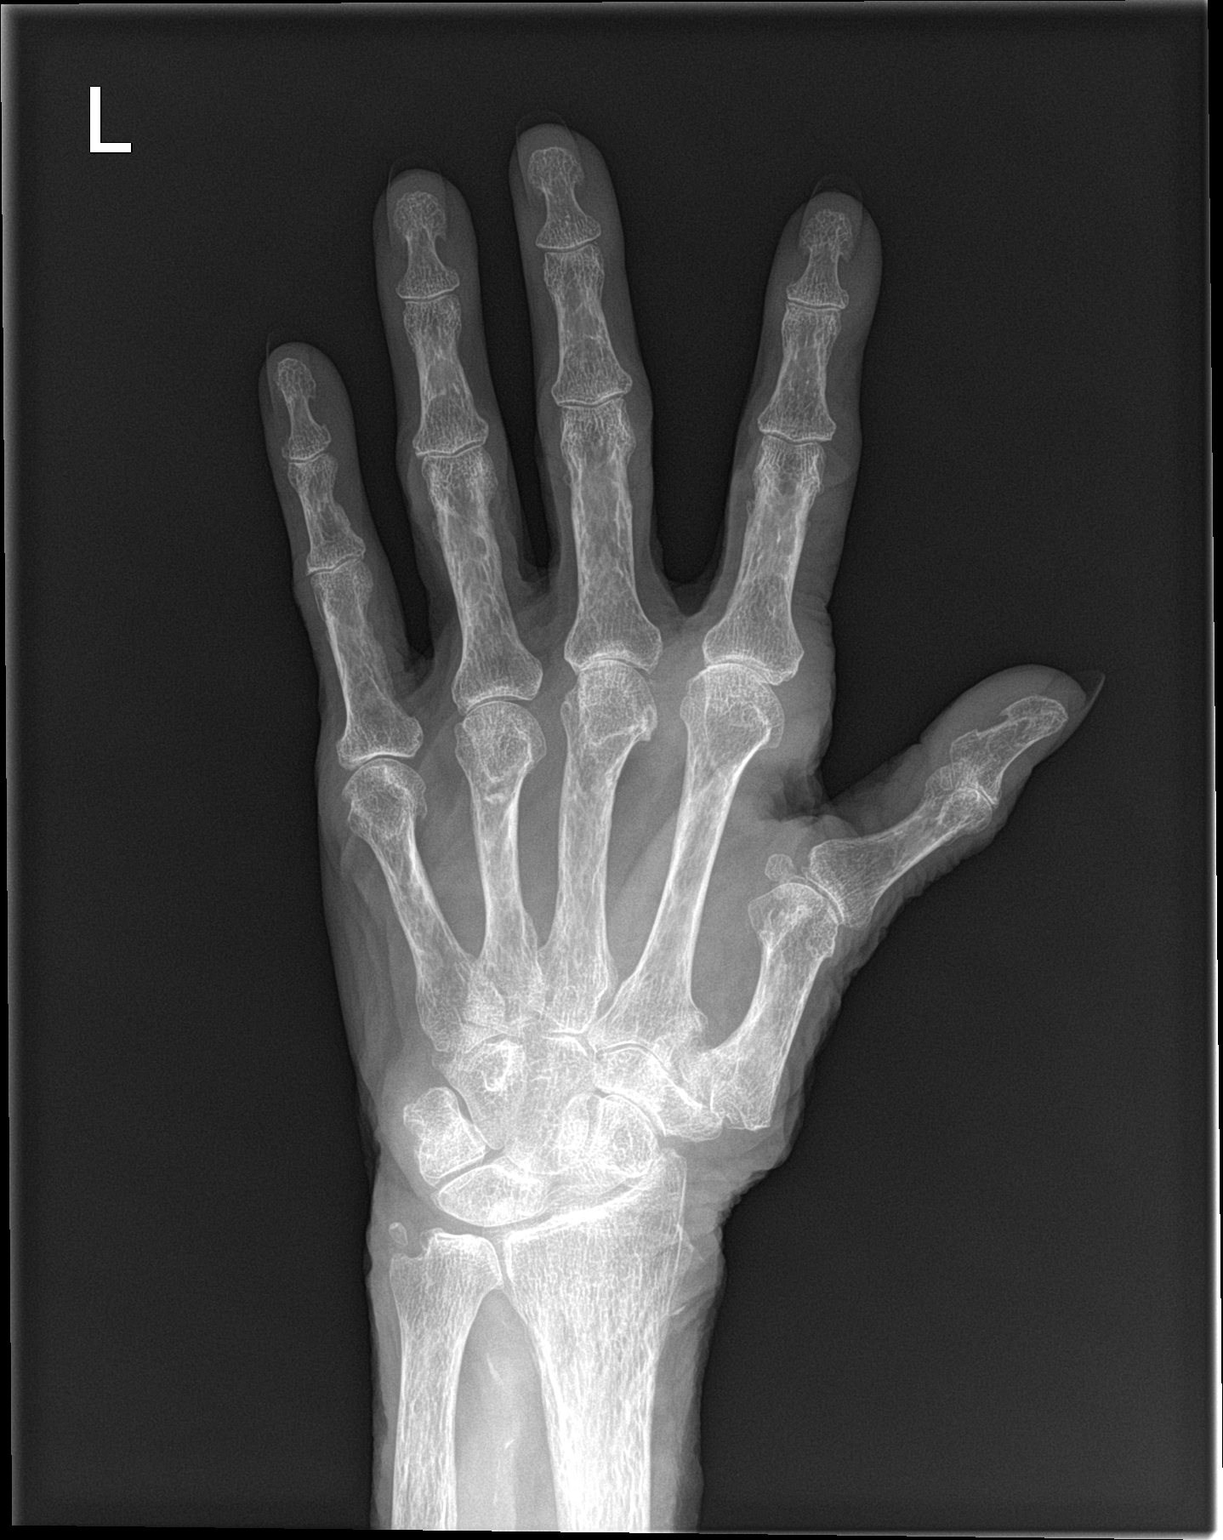

[hand obl]
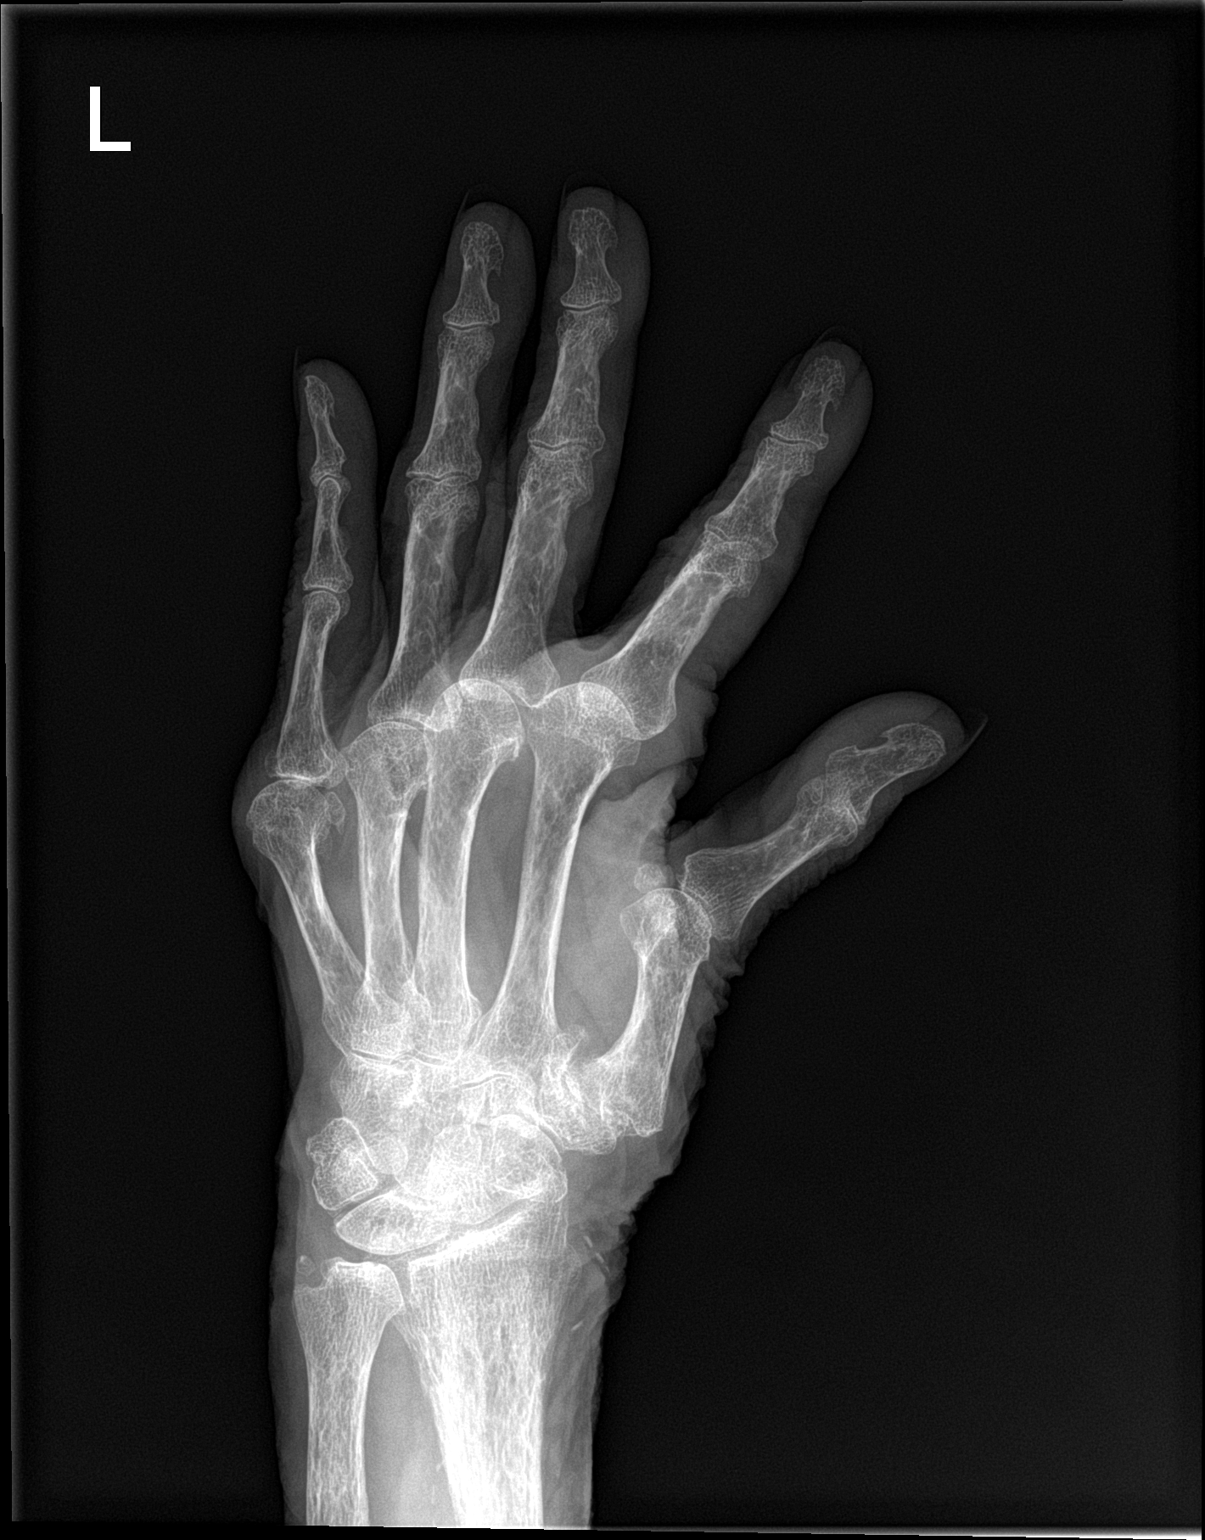

[hand lat]
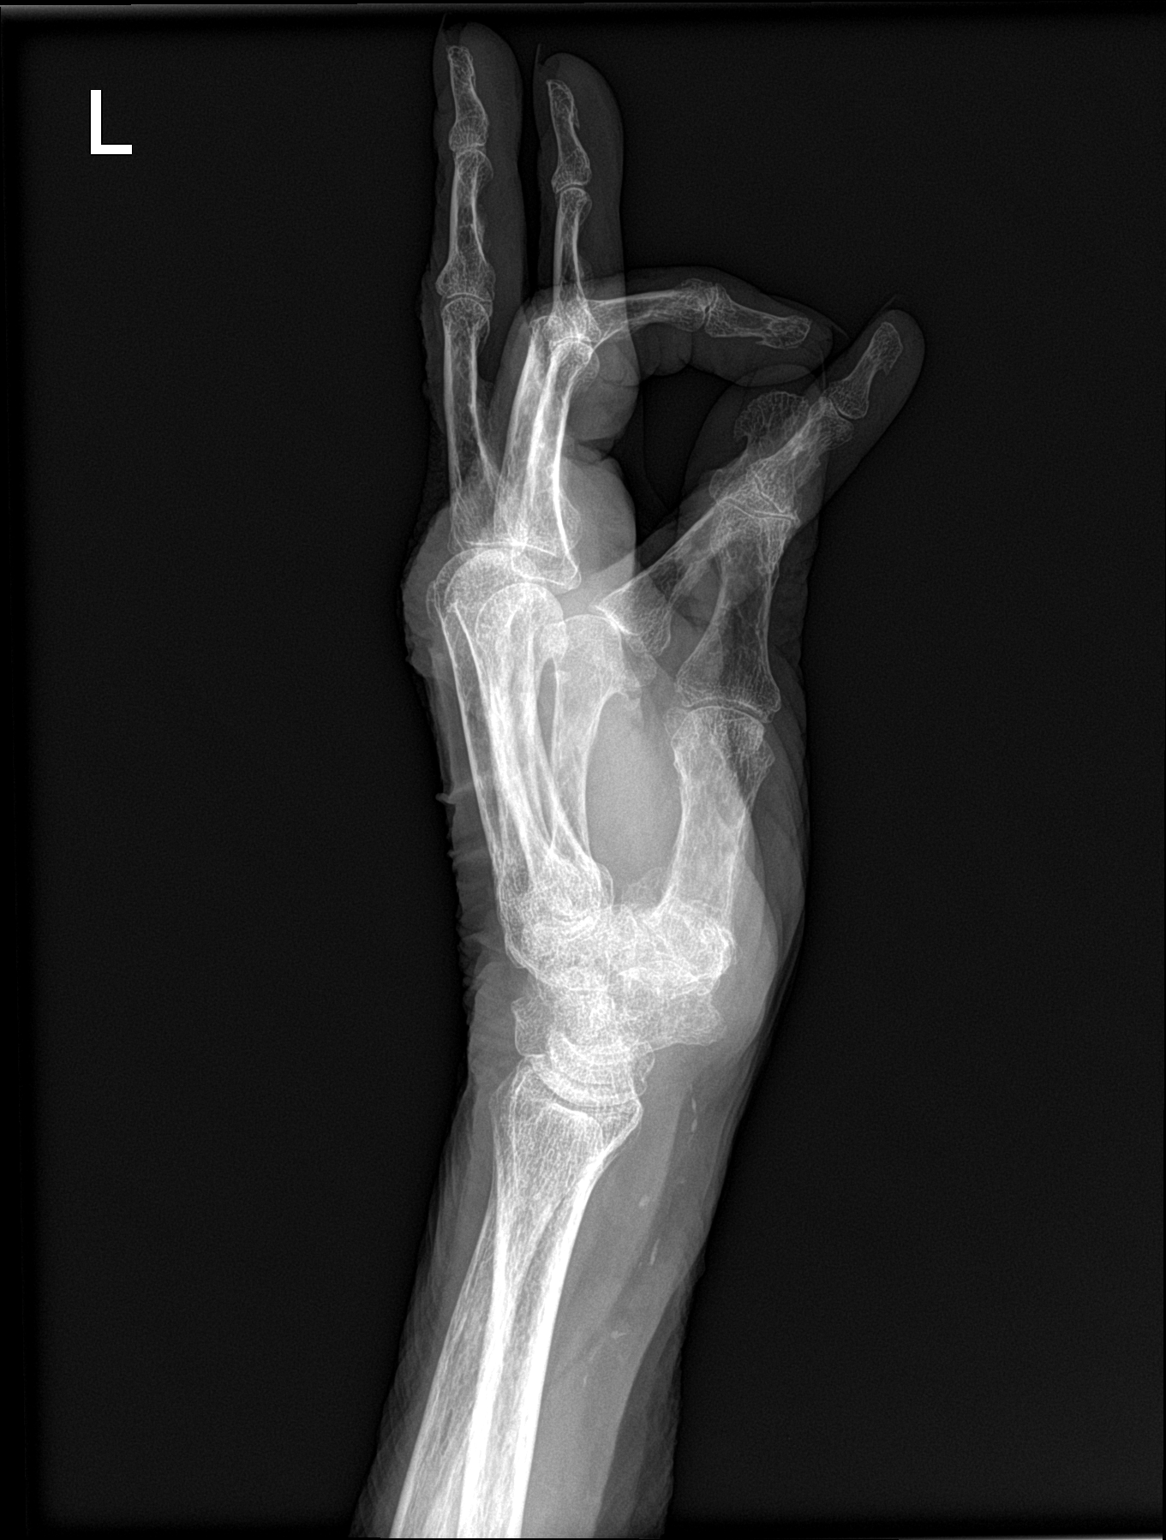

[3 of 3 positions shown; findings below may reference images not displayed]

FINDINGS: Mild osteopenia is noted. No erosive changes are noted. Degenerative
change at the first CMC joint with subluxation is noted. No acute
fracture or dislocation is noted.
IMPRESSION: Mild degenerative change without acute abnormality.
